# Patient Record
Sex: Male | Born: 1935
Health system: Southern US, Community
[De-identification: ages and names within clinical notes are randomized; demographics above are authoritative.]

## PROBLEM LIST (undated history)

## (undated) DIAGNOSIS — E039 Hypothyroidism, unspecified: Secondary | ICD-10-CM

## (undated) DIAGNOSIS — M1990 Unspecified osteoarthritis, unspecified site: Secondary | ICD-10-CM

## (undated) DIAGNOSIS — I447 Left bundle-branch block, unspecified: Secondary | ICD-10-CM

## (undated) DIAGNOSIS — R7302 Impaired glucose tolerance (oral): Secondary | ICD-10-CM

## (undated) DIAGNOSIS — J4489 Other specified chronic obstructive pulmonary disease: Secondary | ICD-10-CM

## (undated) DIAGNOSIS — K635 Polyp of colon: Secondary | ICD-10-CM

## (undated) DIAGNOSIS — E785 Hyperlipidemia, unspecified: Secondary | ICD-10-CM

## (undated) DIAGNOSIS — I6529 Occlusion and stenosis of unspecified carotid artery: Secondary | ICD-10-CM

## (undated) DIAGNOSIS — Z972 Presence of dental prosthetic device (complete) (partial): Secondary | ICD-10-CM

## (undated) DIAGNOSIS — T8859XA Other complications of anesthesia, initial encounter: Secondary | ICD-10-CM

## (undated) DIAGNOSIS — I209 Angina pectoris, unspecified: Secondary | ICD-10-CM

## (undated) DIAGNOSIS — L570 Actinic keratosis: Secondary | ICD-10-CM

## (undated) DIAGNOSIS — M25511 Pain in right shoulder: Secondary | ICD-10-CM

## (undated) DIAGNOSIS — R251 Tremor, unspecified: Secondary | ICD-10-CM

## (undated) DIAGNOSIS — Z87442 Personal history of urinary calculi: Secondary | ICD-10-CM

## (undated) DIAGNOSIS — I2510 Atherosclerotic heart disease of native coronary artery without angina pectoris: Secondary | ICD-10-CM

## (undated) DIAGNOSIS — I10 Essential (primary) hypertension: Secondary | ICD-10-CM

## (undated) DIAGNOSIS — E119 Type 2 diabetes mellitus without complications: Secondary | ICD-10-CM

## (undated) DIAGNOSIS — R0609 Other forms of dyspnea: Secondary | ICD-10-CM

## (undated) DIAGNOSIS — R011 Cardiac murmur, unspecified: Secondary | ICD-10-CM

## (undated) DIAGNOSIS — R072 Precordial pain: Secondary | ICD-10-CM

## (undated) DIAGNOSIS — I872 Venous insufficiency (chronic) (peripheral): Secondary | ICD-10-CM

## (undated) DIAGNOSIS — R0989 Other specified symptoms and signs involving the circulatory and respiratory systems: Secondary | ICD-10-CM

## (undated) DIAGNOSIS — G4730 Sleep apnea, unspecified: Secondary | ICD-10-CM

## (undated) DIAGNOSIS — K219 Gastro-esophageal reflux disease without esophagitis: Secondary | ICD-10-CM

## (undated) DIAGNOSIS — D509 Iron deficiency anemia, unspecified: Secondary | ICD-10-CM

## (undated) DIAGNOSIS — M25561 Pain in right knee: Secondary | ICD-10-CM

## (undated) DIAGNOSIS — J449 Chronic obstructive pulmonary disease, unspecified: Secondary | ICD-10-CM

## (undated) DIAGNOSIS — I4891 Unspecified atrial fibrillation: Secondary | ICD-10-CM

## (undated) DIAGNOSIS — I700 Atherosclerosis of aorta: Secondary | ICD-10-CM

## (undated) HISTORY — DX: Essential (primary) hypertension: I10

## (undated) HISTORY — DX: Type 2 diabetes mellitus without complications: E11.9

## (undated) HISTORY — PX: EYE SURGERY: SHX253

## (undated) HISTORY — DX: Hyperlipidemia, unspecified: E78.5

## (undated) HISTORY — DX: Venous insufficiency (chronic) (peripheral): I87.2

## (undated) HISTORY — DX: Polyp of colon: K63.5

## (undated) HISTORY — DX: Other specified chronic obstructive pulmonary disease: J44.89

## (undated) HISTORY — DX: Impaired glucose tolerance (oral): R73.02

## (undated) HISTORY — DX: Sleep apnea, unspecified: G47.30

## (undated) HISTORY — DX: Actinic keratosis: L57.0

## (undated) HISTORY — DX: Chronic obstructive pulmonary disease, unspecified: J44.9

## (undated) HISTORY — DX: Occlusion and stenosis of unspecified carotid artery: I65.29

## (undated) HISTORY — PX: CARDIAC CATHETERIZATION: SHX172

## (undated) HISTORY — DX: Other specified symptoms and signs involving the circulatory and respiratory systems: R06.09

## (undated) HISTORY — DX: Iron deficiency anemia, unspecified: D50.9

## (undated) HISTORY — DX: Atherosclerotic heart disease of native coronary artery without angina pectoris: I25.10

## (undated) HISTORY — DX: Tremor, unspecified: R25.1

## (undated) HISTORY — PX: TOTAL KNEE ARTHROPLASTY: SHX125

## (undated) HISTORY — DX: Precordial pain: R07.2

## (undated) HISTORY — DX: Other forms of dyspnea: R06.09

## (undated) HISTORY — DX: Other specified symptoms and signs involving the circulatory and respiratory systems: R09.89

---

## 1999-08-06 ENCOUNTER — Inpatient Hospital Stay (HOSPITAL_COMMUNITY): Admission: AD | Admit: 1999-08-06 | Discharge: 1999-08-07 | Payer: Self-pay | Admitting: Cardiology

## 1999-08-07 ENCOUNTER — Encounter: Payer: Self-pay | Admitting: Emergency Medicine

## 2003-11-23 ENCOUNTER — Inpatient Hospital Stay (HOSPITAL_BASED_OUTPATIENT_CLINIC_OR_DEPARTMENT_OTHER): Admission: RE | Admit: 2003-11-23 | Discharge: 2003-11-23 | Payer: Self-pay | Admitting: Cardiology

## 2004-10-12 ENCOUNTER — Emergency Department: Payer: Self-pay | Admitting: Unknown Physician Specialty

## 2005-01-15 ENCOUNTER — Emergency Department: Payer: Self-pay | Admitting: Emergency Medicine

## 2005-11-18 ENCOUNTER — Ambulatory Visit: Payer: Self-pay | Admitting: Cardiology

## 2005-12-09 ENCOUNTER — Ambulatory Visit: Payer: Self-pay

## 2006-10-28 ENCOUNTER — Ambulatory Visit: Payer: Self-pay | Admitting: Cardiology

## 2007-02-24 ENCOUNTER — Ambulatory Visit: Payer: Self-pay | Admitting: General Surgery

## 2007-09-14 ENCOUNTER — Ambulatory Visit: Payer: Self-pay | Admitting: Cardiology

## 2007-09-22 ENCOUNTER — Ambulatory Visit: Payer: Self-pay

## 2007-10-06 DIAGNOSIS — C4492 Squamous cell carcinoma of skin, unspecified: Secondary | ICD-10-CM

## 2007-10-06 HISTORY — DX: Squamous cell carcinoma of skin, unspecified: C44.92

## 2007-10-08 ENCOUNTER — Ambulatory Visit: Payer: Self-pay | Admitting: Cardiology

## 2009-05-04 ENCOUNTER — Ambulatory Visit: Payer: Self-pay | Admitting: Cardiology

## 2009-05-04 DIAGNOSIS — I779 Disorder of arteries and arterioles, unspecified: Secondary | ICD-10-CM | POA: Insufficient documentation

## 2009-05-04 DIAGNOSIS — I739 Peripheral vascular disease, unspecified: Secondary | ICD-10-CM

## 2009-05-04 DIAGNOSIS — I2510 Atherosclerotic heart disease of native coronary artery without angina pectoris: Secondary | ICD-10-CM | POA: Insufficient documentation

## 2009-05-04 DIAGNOSIS — R072 Precordial pain: Secondary | ICD-10-CM | POA: Insufficient documentation

## 2009-05-05 ENCOUNTER — Encounter: Payer: Self-pay | Admitting: Cardiology

## 2009-05-30 ENCOUNTER — Encounter (INDEPENDENT_AMBULATORY_CARE_PROVIDER_SITE_OTHER): Payer: Self-pay

## 2009-05-30 ENCOUNTER — Telehealth (INDEPENDENT_AMBULATORY_CARE_PROVIDER_SITE_OTHER): Payer: Self-pay

## 2009-06-01 ENCOUNTER — Encounter (HOSPITAL_COMMUNITY): Admission: RE | Admit: 2009-06-01 | Discharge: 2009-07-31 | Payer: Self-pay | Admitting: Cardiology

## 2009-06-01 ENCOUNTER — Encounter: Payer: Self-pay | Admitting: Cardiology

## 2009-06-01 ENCOUNTER — Ambulatory Visit: Payer: Self-pay | Admitting: Internal Medicine

## 2009-06-01 ENCOUNTER — Ambulatory Visit: Payer: Self-pay

## 2009-06-01 ENCOUNTER — Encounter: Payer: Self-pay | Admitting: Cardiovascular Disease

## 2009-08-21 ENCOUNTER — Telehealth: Payer: Self-pay | Admitting: Cardiology

## 2009-09-25 ENCOUNTER — Encounter: Payer: Self-pay | Admitting: Cardiology

## 2009-09-26 ENCOUNTER — Ambulatory Visit: Payer: Self-pay

## 2009-09-26 ENCOUNTER — Encounter: Payer: Self-pay | Admitting: Cardiology

## 2009-10-03 ENCOUNTER — Ambulatory Visit: Payer: Self-pay | Admitting: Internal Medicine

## 2009-10-03 DIAGNOSIS — I10 Essential (primary) hypertension: Secondary | ICD-10-CM | POA: Insufficient documentation

## 2009-10-03 DIAGNOSIS — R0609 Other forms of dyspnea: Secondary | ICD-10-CM

## 2009-10-03 DIAGNOSIS — R0989 Other specified symptoms and signs involving the circulatory and respiratory systems: Secondary | ICD-10-CM | POA: Insufficient documentation

## 2009-10-03 DIAGNOSIS — G4730 Sleep apnea, unspecified: Secondary | ICD-10-CM | POA: Insufficient documentation

## 2009-11-09 ENCOUNTER — Ambulatory Visit: Payer: Self-pay | Admitting: Internal Medicine

## 2009-11-09 DIAGNOSIS — J449 Chronic obstructive pulmonary disease, unspecified: Secondary | ICD-10-CM

## 2009-11-09 DIAGNOSIS — J4489 Other specified chronic obstructive pulmonary disease: Secondary | ICD-10-CM | POA: Insufficient documentation

## 2010-02-06 DIAGNOSIS — C4491 Basal cell carcinoma of skin, unspecified: Secondary | ICD-10-CM

## 2010-02-06 HISTORY — DX: Basal cell carcinoma of skin, unspecified: C44.91

## 2010-05-27 HISTORY — PX: TOTAL KNEE ARTHROPLASTY: SHX125

## 2010-06-26 NOTE — Assessment & Plan Note (Signed)
Summary: Cardiology Nuclear Study  Nuclear Med Background Indications for Stress Test: Evaluation for Ischemia, Abnormal EKG  Indications Comments: New LBBB  History: COPD, Emphysema, Heart Catheterization, Myocardial Perfusion Study  History Comments: '05 Cath: N/O CAD, EF=65%; '09 MPS:no ischemia/scar, EF=58%.  Symptoms: Chest Pain with Exertion, DOE  Symptoms Comments: Last episode of ZY:[id number], 05/31/09.   Nuclear Pre-Procedure Cardiac Risk Factors: Carotid Disease, Family History - CAD, History of Smoking, Hypertension, Lipids, Obesity Caffeine/Decaff Intake: None NPO After: 10:00 PM Lungs: Minimal expiratory wheezes.  Albuterol inhaler given, 2 puffs, prior to infusion.  Baseline O2 Sats 93-98% with DB on RA. IV 0.9% NS with Angio Cath: 22g     IV Site: (R) AC IV Started by: Irean Hong RN Chest Size (in) 50     Height (in): 75 Weight (lb): 294 BMI: 36.88  Nuclear Med Study 1 or 2 day study:  1 day     Stress Test Type:  Adenosine Reading MD:  Dietrich Pates, MD     Referring MD:  Valera Castle, MD Resting Radionuclide:  Technetium 26m Tetrofosmin     Resting Radionuclide Dose:  11.0 mCi  Stress Radionuclide:  Technetium 75m Tetrofosmin     Stress Radionuclide Dose:  33.0 mCi   Stress Protocol  Dose of Adenosine:  60.0 mg    Stress Test Technologist:  [employer] CMA-N     Nuclear Technologist:  Burna Mortimer Deal RT-N  Rest Procedure  Myocardial perfusion imaging was performed at rest 45 minutes following the intravenous administration of Myoview Technetium 64m Tetrofosmin.  Stress Procedure  The patient received IV adenosine at 140 mcg/kg/min for 4 minutes. There were episodes of 2nd degree AVB with infusion. He did c/o chest tightness with infusion.  Myoview was injected at the 2 minute mark and quantitative spect images were obtained after a 45 minute delay.  QPS Raw Data Images:  Soft tissue (diaphragm, subcutaneous fat) surround heart Stress Images:  tninning in the  inferiro wall (base) and apex.  Otherwise normal perfusion. Rest Images:  No signficant change from the stress images. Subtraction (SDS):  No evidence of ischemia. Transient Ischemic Dilatation:  1.08  (Normal <1.22)  Lung/Heart Ratio:  .33  (Normal <0.45)  Quantitative Gated Spect Images QGS EDV:  178 ml QGS ESV:  66 ml QGS EF:  63 %   Overall Impression  Exercise Capacity: Adenosine study with no exercise. BP Response: Normal blood pressure response. Clinical Symptoms: Chest tightness. ECG Impression: Nondiagnostic because of baseline changes. Overall Impression: Probable normal perfusion and very mild soft tissue attenuation.  No ischemia.  Appended Document: Cardiology Nuclear Study Low risk study. No change in treatment. Reassurance. SL NTG if he wants.  Appended Document: Cardiology Nuclear Study PT AWARE./CY

## 2010-06-26 NOTE — Miscellaneous (Signed)
Summary: Orders Update pft charges  Clinical Lists Changes  Orders: Added new Service order of Carbon Monoxide diffusing w/capacity (94720) - Signed Added new Service order of Lung Volumes (94240) - Signed Added new Service order of Spirometry (Pre & Post) (94060) - Signed 

## 2010-06-26 NOTE — Miscellaneous (Signed)
Summary: Orders Update  Clinical Lists Changes  Orders: Added new Test order of Carotid Duplex (Carotid Duplex) - Signed 

## 2010-06-26 NOTE — Progress Notes (Signed)
Summary: PROBLEMS  Phone Note Call from Patient Call back at Home Phone [phone]   Caller: SELF Call For: Jared Rice Summary of Call: PT WOULD LIKE TO KNOW WHAT STEPS HE NEEDS TO TAKE NOW-STILL HAVING BURNING IN THE CHEST WITH LESS EXERTION-HAPPENING MORE FREQUENTLY Initial call taken by: [name] Flor,  August 21, 2009 11:32 AM  Follow-up for Phone Call        SPOKE WITH PT CONT. TO HAVE BURNING TO CHEST IS BROUGHT ON  MORE FREQUENTLY CONT TO DISIPATE WITH REST AFTER 1-2 MIN NO NTG TAKEN HAS SEVERAL QUESTIONS DOES HE NEED PULMONARY  TO EVALUATE  IS C/O OF SOB ? ALSO WAS INFORMED BY PMD HAS COPD  OR SHOULD HE F/U WITH PMD FOR ABOVE PLEASE ADVISE. CELL PHONE NUMBER [phone] Follow-up by: Scherrie Bateman, LPN,  August 21, 2009 11:59 AM  Additional Follow-up for Phone Call Additional follow up Details #1::        Do not feel cardiac. Check with Primary Care to see if Pulmonary Consult indicated. Additional Follow-up by: Gaylord Shih, MD, [facility],  August 21, 2009 2:45 PM     Appended Document: PROBLEMS PT AWARE TO F/U WITH PMD.  ALSO AWARE IF CONT ONLY OTHER TEST WOULD BE CATH TO FURTHER EVALUATE CHEST PAIN.CY

## 2010-06-26 NOTE — Assessment & Plan Note (Signed)
Summary: Pulmonary/ new pt eval for atypical sob and cp   Visit Type:  Initial Consult Copy to:  Dr. Valera Castle Primary Provider/Referring Provider:  Franne Forts  CC:  Chest Pain.  History of Present Illness: 77 yowm Former smoker. Quit in 1982 with no  resp symptoms then.  Oct 03, 2009 cc 8 years doe and  ex cp worse x 1 year with 2 neg LHC and neg cardiolyte does not ever remember reprocuing  this during any treadmills.  minimal assoc throat clearing  and HB but no real cough. Pt denies any significant sore throat, dysphagia, itching, sneezing,  nasal congestion or excess secretions,  fever, chills, sweats, unintended wt loss, pleuritic cp, hempoptysis, change in activity tolerance  orthopnea pnd or leg swelling.  Pt also denies any obvious fluctuation in symptoms with weather or environmental change or other alleviating or aggravating factors.       Current Medications (verified): 1)  Doxazosin Mesylate 8 Mg Tabs (Doxazosin Mesylate) .[name].. 1 By Mouth Daily 2)  Levothyroxine Sodium 75 Mcg Tabs (Levothyroxine Sodium) .[name].. 1 By Mouth Daily 3)  Crestor 20 Mg Tabs (Rosuvastatin Calcium) .... 1/2 By Mouth At Bedtime 4)  Furosemide 20 Mg Tabs (Furosemide) .[name].. 1 By Mouth Every Morning 5)  Quinapril Hcl 40 Mg Tabs (Quinapril Hcl) .[name].. 1 By Mouth Daily 6)  Aspirin 325 Mg Tabs (Aspirin) .[name].. 1 By Mouth At Bedtime 7)  Multivitamins  Tabs (Multiple Vitamin) .[name].. 1 By Mouth Every Morning 8)  Aleve 220 Mg Tabs (Naproxen Sodium) .... 2 By Mouth Every Morning 9)  Nitrolingual 0.4 Mg/spray Soln (Nitroglycerin) .... One Spray Under Tongue Every 5 Minutes As Needed For Chest Pain---May Repeat Times Three 10)  Vitamin D 2000 Unit Tabs (Cholecalciferol) .[name].. 1 Once Daily  Allergies (verified): No Known Drug Allergies  Past History:  Past Medical History: CAROTID ARTERY STENOSIS, WITHOUT INFARCTION (ICD-433.10) CHEST PAIN-PRECORDIAL (ICD-786.51) CAD, NATIVE VESSEL (ICD-414.01) Sleep  Apnea Atypical Dyspnea with pseudowheeze resolves with purse lip maneuver       - Try off ACE Oct 03, 2009   Social History: Married Retired from [employer] 2 twin daughters (lives in [place]) 2 step-daughters Former smoker. Quit in 1982.  Smoked for 30 yrs up to 3 ppd. Alcohol-occasionally when out with friends Illicit drugs- no  Review of Systems       The patient complains of shortness of breath with activity, chest pain, acid heartburn, and hand/feet swelling.  The patient denies shortness of breath at rest, productive cough, non-productive cough, coughing up blood, irregular heartbeats, indigestion, loss of appetite, weight change, abdominal pain, difficulty swallowing, sore throat, tooth/dental problems, headaches, nasal congestion/difficulty breathing through nose, sneezing, itching, ear ache, anxiety, depression, joint stiffness or pain, rash, change in color of mucus, and fever.    Vital Signs:  Patient profile:   77 year old male Weight:      301 pounds O2 Sat:      95 % on Room air Temp:     97.4 degrees F oral Pulse rate:   64 / minute BP sitting:   140 / 82  (left arm)  Vitals Entered By: Vernie Murders (Oct 03, 2009 2:56 PM)  O2 Flow:  Room air  Physical Exam  Additional Exam:  pleasant amb wm mild hoarseness and pseudowheeze resolves with purse lip maneuver  HEENT: nl dentition, turbinates, and orophanx. Nl external ear canals without cough reflex NECK :  without JVD/Nodes/TM/ nl carotid upstrokes bilaterally LUNGS: no  acc muscle use, clear to A and P bilaterally without cough on insp or exp maneuvers CV:  RRR  no s3 or murmur or increase in P2, no edema  ABD:  soft and nontender with nl excursion in the supine position. No bruits or organomegaly, bowel sounds nl MS:  warm without deformities, calf tenderness, cyanosis or clubbing SKIN: warm and dry without lesions   NEURO:  alert, approp, no deficits     Impression &  Recommendations:  Problem # 1:  DYSPNEA (ICD-786.09)   DDX of  difficult airways managment all start with A and  include Adherence, Ace Inhibitors, Acid Reflux, Active Sinus Disease, Alpha 1 Antitripsin deficiency, Anxiety masquerading as Airways dz,  ABPA,  allergy(esp in young), Aspiration (esp in elderly), Adverse effects of DPI,  Active smokers, plus one B  = Beta blocker use..   This is either ace or Acid Reflux (occult) or combination of the two.  try off ace and return for pft's  Problem # 2:  HYPERTENSION, BENIGN (ICD-401.1)  The following medications were removed from the medication list:    Quinapril Hcl 40 Mg Tabs (Quinapril hcl) .[name]... 1 by mouth daily His updated medication list for this problem includes:    Doxazosin Mesylate 8 Mg Tabs (Doxazosin mesylate) .[name]... 1 by mouth daily    Furosemide 20 Mg Tabs (Furosemide) .[name]... 1 by mouth every morning    Diovan 320 Mg Tabs (Valsartan) ..... One by mouth daily    ACE inhibitors are problematic in  pts with airway complaints because  even experienced pulmonologists can't always distinguish ace effects from copd/asthma.  By themselves they don't actually cause a problem, much like oxygen can't by itself start a fire, but they certainly serve as a powerful catalyst or enhancer for any "fire"  or inflammatory process in the upper airway, be it caused by an ET  tube or more commonly reflux (especially in the obese or pts with known GERD or who are on biphoshonates).  In the era of ARB near equivalency until we have a better handle on the reversibility of the airway problem, it just makes sense to avoid ace entirely in the short run and then decide later, having established a level of airway control using a reasonable limited regimen, whether to add back ace but even then being very careful to observe the pt for worsening airway control and number of meds used/ needed to control symptoms.    Medications Added to Medication List This Visit: 1)   Diovan 320 Mg Tabs (Valsartan) .... One by mouth daily 2)  Vitamin D 2000 Unit Tabs (Cholecalciferol) .[name].. 1 once daily  Other Orders: T-2 View CXR ([id number])  Patient Instructions: 1)  Please schedule a follow-up appointment in 6 weeks, sooner if needed with PFT's on return 2)  Stop quinapril and start diovan 320  3)  Stop Vit D and all oil baseds vitamins for now 4)  GERD (REFLUX)  is a common cause of respiratory symptoms. It commonly presents without heartburn and can be treated with medication, but also with lifestyle changes including avoidance of late meals, excessive alcohol, smoking cessation, and avoid fatty foods, chocolate, peppermint, colas, red wine, and acidic juices such as orange juice. NO MINT OR MENTHOL PRODUCTS SO NO COUGH DROPS  5)  USE SUGARLESS CANDY INSTEAD (jolley ranchers)  6)  NO OIL BASED VITAMINS   Appended Document: Pulmonary/ new pt eval for atypical sob and cp cxr :  Chronic obstructive pulmonary disease  with mild cardiac enlargement.  No acute cardiopulmonary process demonstrated.    Appended Document: Orders Update    Clinical Lists Changes  Orders: Added new Service order of New Patient Level V [phone]) - Signed

## 2010-06-26 NOTE — Miscellaneous (Signed)
Summary: Outpatient Coinsurance Notice  Outpatient Coinsurance Notice   Imported By: Marylou Mccoy 06/06/2009 15:48:37  _____________________________________________________________________  External Attachment:    Type:   Image     Comment:   External Document

## 2010-06-26 NOTE — Miscellaneous (Signed)
Summary: Change to adenosine myoview per Dr. Algis Greenhouse  Clinical Lists Changes     Spoke with Dr. Daleen Squibb about Dobutamine Echo for 06/01/09 in regards to new LBBB that can affect the  wall motion on echo, change to adenosine myoview per Dr. Karie Schwalbe. Wall/ P. Marites Nath,RN.

## 2010-06-26 NOTE — Progress Notes (Signed)
Summary: Nuc. Pre-Procedure  Phone Note Outgoing Call Call back at [facility] Phone [phone]   Call placed by: Irean Hong, RN,  May 30, 2009 3:06 PM Summary of Call: Reviewed information on Myoview Information Sheet (see scanned document for further details).  Spoke with Jared Rice's wife, and Jared Rice.     Nuclear Med Background Indications for Stress Test: Evaluation for Ischemia, Abnormal EKG  Indications Comments: New LBBB  History: COPD, Heart Catheterization, Myocardial Perfusion Study  History Comments: '05 Cath: N/O CAD,EF=65%. 4/09 MPS:(-) ischemia,(-) scar,EF=58%.  Symptoms: Chest Pain with Exertion, DOE    Nuclear Pre-Procedure Cardiac Risk Factors: Carotid Disease, Family History - CAD, History of Smoking, Hypertension, Lipids Height (in): 75

## 2010-06-26 NOTE — Assessment & Plan Note (Signed)
Summary: Pulmonary f/u summary ov - rx empiric ppi   Copy to:  Dr. Valera Castle Primary Provider/Referring Provider:  Franne Forts  CC:  6 wk followup.  Pt states that he is still having the same CP "maybe some improvement".  He states that his breathing is the same- no better or worse.  He c/o swelling in feet getting worse.  [name]  History of Present Illness: 72 yowm Former smoker. Quit in 1982 with no  resp symptoms then.  Oct 03, 2009 initial pulm eval  cc 8 years doe and  ex cp worse x 1 year with 2 neg LHC and neg cardiolyte does not ever remember reproducing  this during any treadmills.  minimal assoc throat clearing  and HB but no real cough.  imp was atypical cp ? gerd ? ace plus gerd effects Stop quinapril and start diovan 320  Stop Vit D and all oil baseds vitamins for now/ gerd diet  November 09, 2009 6 wk followup.  Pt states that he is still having the same CP "maybe some improvement".  He states that his breathing is the same- no better or worse.  He c/o swelling in feet getting worse.  Pt denies any significant sore throat, dysphagia, itching, sneezing,  nasal congestion or excess secretions,  fever, chills, sweats, unintended wt loss, classic or reproducible  pleuritic , hempoptysis, change in activity tolerance  orthopnea pnd     Pt also denies any obvious fluctuation in symptoms with weather or environmental change or other alleviating or aggravating factors.          Current Medications (verified): 1)  Doxazosin Mesylate 8 Mg Tabs (Doxazosin Mesylate) .[name].. 1 By Mouth Daily 2)  Levothyroxine Sodium 75 Mcg Tabs (Levothyroxine Sodium) .[name].. 1 By Mouth Daily 3)  Crestor 20 Mg Tabs (Rosuvastatin Calcium) .... 1/2 By Mouth At Bedtime 4)  Furosemide 20 Mg Tabs (Furosemide) .[name].. 1 By Mouth Every Morning 5)  Aspirin 325 Mg Tabs (Aspirin) .[name].. 1 By Mouth At Bedtime 6)  Multivitamins  Tabs (Multiple Vitamin) .[name].. 1 By Mouth Every Morning 7)  Diovan 320 Mg  Tabs (Valsartan) .... One By  Mouth Daily 8)  Aleve 220 Mg Tabs (Naproxen Sodium) .... 2 By Mouth Every Morning 9)  Nitrolingual 0.4 Mg/spray Soln (Nitroglycerin) .... One Spray Under Tongue Every 5 Minutes As Needed For Chest Pain---May Repeat Times Three  Allergies (verified): No Known Drug Allergies  Past History:  Past Medical History: CAROTID ARTERY STENOSIS, WITHOUT INFARCTION (ICD-433.10) CHEST PAIN-PRECORDIAL (ICD-786.51) CAD, NATIVE VESSEL (ICD-414.01) Sleep Apnea Atypical Dyspnea with pseudowheeze resolves with purse lip maneuver       - Try off ACE Oct 03, 2009 >  November 09, 2009 some better COPD      - PFTs November 09, 2009 FEV1 2.47 (76%) ratio 63 with no better after B2 DLC0 84  Vital Signs:  Patient profile:   77 year old male Weight:      300 pounds O2 Sat:      94 % on Room air Temp:     97.7 degrees F oral Pulse rate:   56 / minute BP sitting:   148 / 74  (right arm)  Vitals Entered By: Vernie Murders (November 09, 2009 12:14 PM)  O2 Flow:  Room air  Physical Exam  Additional Exam:  pleasant amb wm no longer  hoarse and no pseudowheeze  HEENT: nl dentition, turbinates, and orophanx. Nl external ear canals without cough reflex NECK :  without JVD/Nodes/TM/ nl carotid upstrokes bilaterally LUNGS: no acc muscle use, clear to A and P bilaterally without cough on insp or exp maneuvers CV:  RRR  no s3 or murmur or increase in P2, no edema  ABD:  soft and nontender with nl excursion in the supine position. No bruits or organomegaly, bowel sounds nl MS:  warm without deformities, calf tenderness, cyanosis or clubbing     CXR  Procedure date:  10/03/2009  Findings:        IMPRESSION: Chronic obstructive pulmonary disease with mild cardiac enlargement.  No acute cardiopulmonary process demonstrated.  Impression & Recommendations:  Problem # 1:  DYSPNEA (ICD-786.09)  DDX of  difficult airways managment all start with A and  include Adherence, Ace Inhibitors, Acid Reflux, Active Sinus  Disease, Alpha 1 Antitripsin deficiency, Anxiety masquerading as Airways dz,  ABPA,  allergy(esp in young), Aspiration (esp in elderly), Adverse effects of DPI,  Active smokers, plus one B  = Beta blocker use..   This is either ace or Acid Reflux (occult) or combination of the two. off ace improving but needs cpst if not showing training benefit on empiric prilosec 20 mg daily trial x minimum of 2 weeks  Orders: Est. Patient Level IV (16109)  Problem # 2:  COPD UNSPECIFIED (ICD-496) Minimal sp remote smoking cessation with residual fev1 > 2 liters so should not limit exertion.  Problem # 3:  HYPERTENSION, BENIGN (ICD-401.1)  His updated medication list for this problem includes:    Doxazosin Mesylate 8 Mg Tabs (Doxazosin mesylate) .[name]... 1 by mouth daily    Furosemide 20 Mg Tabs (Furosemide) .[name]... 1 by mouth every morning    Diovan 320 Mg Tabs (Valsartan) ..... One by mouth daily  Would prefer to avoid ace until sort out his atypical symptoms bute defer longterm rx to Dr Sullivan Lone    Patient Instructions: 1)  You have minimal residual smoking damage and should not be limited by your breathing. 2)  Weight control is simply a matter of calorie balance which needs to be tilted in your favor by eating less and exercising more.  To get the most out of exercise, you need to be continuously aware that you are short of breath, but never out of breath, for 30 minutes daily. As you improve, it will actually be easier for you to do the same amount in  30 minutes so always push to the level where you are short of breath.  If this does not result in gradual weight reduction,  I recommend  a nutritionist for a food diary  3)  If ex tolerance improving over several weeks, follow up Sullivan Lone 4)  If not improving call me to schedule a cpst 5)  Late ADD:  start prilosec 20mg  otc Take  one 30-60 min before first meal of the day x 2 weeks trial minimum before consider cpst  Appended Document: Pulmonary f/u  summary ov - rx empiric ppi see late addendum on instructions and call to pt  Appended Document: Pulmonary f/u summary ov - rx empiric ppi Spoke with pt and advised to get prilosec OTC 20 mg and take 1 30-60 min before 1st meal x 2 wks as a trial.  Pt verbalzied understanding.

## 2010-08-20 ENCOUNTER — Ambulatory Visit: Payer: Self-pay | Admitting: General Practice

## 2010-09-03 ENCOUNTER — Inpatient Hospital Stay: Payer: Self-pay | Admitting: General Practice

## 2010-09-14 ENCOUNTER — Ambulatory Visit: Payer: Self-pay

## 2010-09-16 ENCOUNTER — Inpatient Hospital Stay: Payer: Self-pay | Admitting: Internal Medicine

## 2010-09-16 DIAGNOSIS — R079 Chest pain, unspecified: Secondary | ICD-10-CM

## 2010-09-17 DIAGNOSIS — R0602 Shortness of breath: Secondary | ICD-10-CM

## 2010-10-09 NOTE — Assessment & Plan Note (Signed)
[facility] OFFICE NOTE   NAME:Bedgood, LEIBISH MCGREGOR                      MRN:          [id number]  DATE:10/28/2006                            DOB:          April 29, 1933    Mr. Niazi returns today for further management of the following  issues:  1. Nonobstructive coronary artery disease.  2. Normal left ventricular systolic function.  3. History of exertional dyspnea and chest discomfort.  Stress Myoview      December 09, 2005:  EF 58%, no sign of scar or ischemia.  Exercise      tolerance reduced.  4. Mixed hyperlipidemia.  5. Obesity.  6. Nonobstructive carotid disease.  Carotid Dopplers December 09, 2005.   He enjoys playing golf 3 days a week with his friends.  He is having a  hard time keeping his weight down.  His blood sugar was slightly  elevated at 103 on his last blood work with Dr. Sullivan Lone.   MEDICATIONS:  1. Doxazosin 8 mg a day.  2. Levothyroxine 25 mcg a day.  3. Quinapril 40 mg a day.  4. Crestor 10 mg a day.  5. Furosemide 20 mg a day.  6. Enteric-coated aspirin 325 mg a day.   EXAMINATION:  GENERAL:  He is very jovial.  No acute distress.  Alert  and oriented x3.  NEUROLOGIC:  Exam is intact.  VITAL SIGNS:  Blood pressure is 118/70, his pulse 53.  He is in sinus  bradycardia with a first-degree AV block, unchanged from before.  His  weight is 286, which is up.  HEENT:  Normocephalic, atraumatic.  PERRLA.  Extraocular movements  intact.  Sclerae clear.  Facial symmetry is normal.  NECK:  Carotids are full without bruits.  There is no JVD.  Thyroid is  not enlarged.  Trachea is midline.  LUNGS:  Clear.  HEART:  Reveals a soft S1, S2 without murmur or gallop.  PMI could not  be appreciated.  ABDOMEN:  Obese with good bowel sounds.  Organomegaly cannot be  appreciated.  EXTREMITIES:  Reveal no cyanosis, clubbing or edema.  Pulses are  present.   ASSESSMENT AND PLAN:  I have had a nice chat with  Mr. Rufener today.  Though he is clinically stable at present with his cardiac and vascular  disease, I am very concerned about his risk of developing type 2  diabetes and all its negative effects.  We have had a long talk about  this today.  I have strongly encouraged him to try to drop 25-30 pounds  and keep it off.   I have made no changes in his program.  I will plan on seeing him back  in a year.     Thomas C. Daleen Squibb, MD, [facility]  Electronically Signed    TCW/MedQ  DD: 10/28/2006  DT: 10/28/2006  Job #: [id number]   cc:   Julieanne Manson

## 2010-10-09 NOTE — Assessment & Plan Note (Signed)
[facility] OFFICE NOTE   NAME:Brocious, DARIUS LUNDBERG                      MRN:          [id number]  DATE:09/14/2007                            DOB:          Oct 06, 1932    HISTORY OF PRESENT ILLNESS:  Mr. Jared Rice comes in today for recurrent  chest discomfort.   Over the last several months, but really off and on chronically, he has  had some aching left the sided chest discomfort that does not radiate.  It seems to be associated with exertion, though his history is very  vague.  He is under a lot of stress with a couple of family problems.   He does have some exertional dyspnea and has had this in the past.  His  last stress Myoview was December 09, 2005, EF 58%.  No scar ischemia.  His  exercise tolerance was reduced.  He does have a history of  nonobstructive coronary disease and his last cath was November 23, 2003.  His most significant lesion was only about 20%.  Normal left ventricular  function.   He has hypertension as well as mixed hyperlipidemia and his weight  unfortunately continues to go up.  He also has a history of  nonobstructive carotid disease which is asymptomatic.   Denies orthopnea, PND peripheral edema, presyncope or syncope.  He does  have some neck pain on occasion and was worried this was his carotids.   MEDICATIONS:  1. Doxazosin 8 mg a day.  2. Levothyroxine 25 mcg a day.  3. Quinapril 40 mg a day.  4. Crestor 10 mg a day.  5. Furosemide 20 mg day.  6. Enteric-coated aspirin 325 mg a day.   PHYSICAL EXAMINATION:  VITAL SIGNS:  Blood pressure today is 136/80,  pulse 75 and regular.  Weight has gone up to 298 from 286.  HEENT:  Unchanged.  NECK:  Carotids are full.  He has a soft right carotid bruit.  Thyroid  is not enlarged.  Trachea is midline.  LUNGS:  Clear to auscultation.  HEART:  Reveals a poorly appreciated PMI.  He has normal S1-S2 without  gallop.  ABDOMEN:  Good bowel sounds.   Organomegaly was difficult to assess.  EXTREMITIES:  Reveal 1+ pretibial edema.  Pulses are intact.  NEUROLOGICAL:  Exam is intact.  SKIN:  Unremarkable.   ASSESSMENT:  I had a long talk with Mr. Nicolosi today.  We reviewed an  EKG that was done, obtained in Dr. Elisabeth Cara office on April 9.  This  was essentially normal and there has been no change.   I would be surprised if Mr. Miyasato has developed obstructive coronary  disease.  However, he is having exertional chest discomfort and  shortness of breath.  Before we proceed with cath, will perform an  adenosine rest/stress Myoview.  This is low risk or shows no ischemia.  Will continue with secondary prevention.  In addition, we will obtain  carotid Dopplers to rule out any progressive disease in his carotids.   Will schedule him to come back just to sit down  and talk for reassurance  and answer any questions you might have in a couple of weeks.     Thomas C. Daleen Squibb, MD, [facility]  Electronically Signed    TCW/MedQ  DD: 09/14/2007  DT: 09/14/2007  Job #: [id number]   cc:   Julieanne Manson

## 2010-10-09 NOTE — Assessment & Plan Note (Signed)
[facility] OFFICE NOTE   NAME:Jared Rice, Jared Rice                      MRN:          [id number]  DATE:10/08/2007                            DOB:          18-Dec-1932    Mr. Gola returns today for further management of his chest  discomfort.   His stress Myoview shows an EF of 63% with normal contractility and  thickening of all areas of the myocardium.  There was no sign of scar or  ischemia.   His carotid Doppler showed nonobstructive plaque in the carotid arteries  and antegrade flow in both vertebrals   I discussed this at length with him today.  I have told him to stay on  quinapril, Crestor, and aspirin.  He will try to get his weight down.  I  will plan on seeing him back in a year.     Thomas C. Daleen Squibb, MD, [facility]  Electronically Signed    TCW/MedQ  DD: 10/08/2007  DT: 10/08/2007  Job #: [id number]   cc:   Julieanne Manson

## 2010-10-12 NOTE — Discharge Summary (Signed)
[place]  Patient:    ZAELYN, BARBARY                        MRN: [id number] Adm. Date:  [id number] Disc. Date: [id number] Attending:  Rollene Rotunda Dictator:   Tereso Newcomer, P.A.-C.                           Discharge Summary  DATE OF BIRTH:  1933/01/12  DISCHARGE DIAGNOSES: 1. Chest pain, ruled out for a myocardial infarction by enzymes    and electrocardiogram.  Normal stress Cardiolite August 07, 1999. 2. Hypertension. 3. Possible knee osteoarthritis. 4. Cardiac catheterization five years prior reported as clean, per the    patient. 5. A 2-D echocardiogram and Cardiolite negative eight to nine years prior.  HISTORY OF PRESENT ILLNESS:  This 77 year old white male with hypertension began to have strange symptoms three to four months prior to admission.  He noted that he almost passed out and felt nauseated.  It happened again a few days prior to admission x 2.  This prompted him to go to the emergency room at [facility]. He had a negative workup there, including a head CT, D-dimer, and electrocardiogram per his report.  On the morning of admission he had the same symptoms with a near syncopal episode.  He had to sit down.  He noticed tingling in his hands and legs. He also noted some chest pain mowing the lawn.  These were the same symptoms he had had five years prior that prompted a cardiac catheterization.  The chest pain usually goes away 10 minutes after starting the lawnmower.  He has had some increased dyspnea on exertion recently with going up steps.  PHYSICAL EXAMINATION:  GENERAL:  A well-developed, well-nourished male, in no apparent distress.  VITAL SIGNS:  Blood pressure 155/85, pulse 48.  HEART:  S1, S2, brady, no murmurs.  LUNGS:  Clear to auscultation.  EXTREMITIES:  Without edema.  HOSPITAL COURSE:  The patient was admitted and observed on telemetry.  He had no significant arrhythmias noted.  He had  cardiac enzymes drawn and these were negative.  A total CPK #1 of 60, #2 of 51, #3 of 49.  CPK-MB #1 of 1.1, #2 of 0.7, #3 of 2.0.  Troponin I less than 0.03 x 2.  The patient had a stress Cardiolite  performed on August 07, 1999.  This test was symptom-limited and less than 85% of predicted maximal heart rate was achieved.  The patient experienced some shortness of breath and slight chest tightness.  Cardiolite images showed no evidence of stress-induced ischemia.  The ejection fraction of 61%.  DISPOSITION:  The patient was felt stable enough for discharge to home, with followup in the office with Dr. Noralyn Pick. Nishan.  DISCHARGE MEDICATIONS: 1. Toprol XL 50 mg q.d. 2. Accupril 40 mg q.d. 3. Cardura 8 mg q.d. 4. Enteric-coated aspirin 325 mg q.d. 5. Multivitamin and vitamin C. 6. Glucosamine. 7. Aleve p.r.n. 8. Prevacid 30 mg q.d.  DIET:  The patient is to maintain a low-fat, low-sodium diet.  INSTRUCTIONS:  He is to call the office in the morning for a follow-up appointment with Dr. Eden Emms.  DISCHARGE LABORATORY DATA:  WBC 7.9, hemoglobin 12.1, hematocrit 36.4, RDW 14.7, platelet count 178,000.  Sodium 137, potassium 3.7, chloride 104, CO2 of 29, glucose 117, BUN 13, creatinine 0.8.  Calcium 8.8, total protein  6.7, albumin 3.5, AST 19, ALT 15, alkaline phosphatase 74, total bilirubin 0.7. DD:  10/03/99 TD:  10/03/99 Job: [id number]

## 2010-10-12 NOTE — Cardiovascular Report (Signed)
NAMEBAYLOR, CORTEZ NO.:  [id number]   MEDICAL RECORD NO.:  [id number]                   PATIENT TYPE:  OIB   LOCATION:  6501                                 FACILITY:  [facility]   PHYSICIAN:  Jonelle Sidle, M.D. [facility]        DATE OF BIRTH:  May 01, 1933   DATE OF PROCEDURE:  11/23/2003  DATE OF DISCHARGE:  11/23/2003                              CARDIAC CATHETERIZATION   CARDIOLOGIST:  Maisie Fus C. Wall, M.D.   CARDIOLOGIST PERFORMING CATHETERIZATION:  Jonelle Sidle, M.D.   PRIMARY CARE PHYSICIAN:  Julieanne Manson, M.D., [facility]   INDICATIONS:  Mr. Mackins is a 77 year old male with a history of  hypertension, obesity, dyslipidemia, and nonobstructive coronary  atherosclerosis by catheterization in 1998.  He has had some recent  exertional chest pain and is referred for redefinition of his coronary  anatomy to assess the potential for progression.   PROCEDURES PERFORMED:  1. Left heart catheterization.  2. Selective coronary angiography.  3. Left ventriculography.   ACCESS AND EQUIPMENT:  The area about the right femoral artery was  anesthetized with 1% lidocaine and a 5 French sheath was placed in the right  femoral artery via the modified Seldinger technique. Standard preformed 6  [place] and JR4 catheters were used for selective coronary angiography,  and an angled pigtail catheter was used for left heart catheterization and  left ventriculography.  All exchanges were made over a wire, and the patient  tolerated the procedure without immediate complications.   FINDINGS:  HEMODYNAMICS:  1. The LV is 185/20 mmHg.  2. Aorta 189/92 mmHg.   ANGIOGRAPHIC FINDINGS:  1. Left main coronary artery is free of significant flow-limiting coronary     sclerosis.  There are minor luminal irregularities noted.  2. The left anterior descending is a medium caliber vessel with two diagonal     branches proximally and at the mid  vessel level.  There is a 30% stenosis     involving the first diagonal branch. The left anterior descending has     mild diffuse atherosclerosis with 20% proximal stenosis and 20-30% mid     vessel stenosis.  3. The circumflex coronary artery is a large caliber vessel with a large     bifurcating first obtuse marginal branch followed by a smaller second     obtuse marginal branch and a larger third obtuse marginal branch.  There     are minor luminal irregularities with no flow-limiting coronary     atherosclerosis.  4. The right coronary artery is a large dominant vessel.  There is a 20%     stenosis in the posterior descending branch and other mild luminal     irregularities.   LEFT VENTRICULOGRAPHY:  Left ventriculography was performed in RAO  projection and revealed an ejection fraction of approximately 65% mitral  regurgitation.   DIAGNOSES:  1. Mild nonobstructive coronary atherosclerosis as described.  2.  Left ventricular ejection fracture approximately 65% without significant     mitral regurgitation.   PLAN:  I discussed the results with Dr. Daleen Squibb.  At this point, we will  continue aggressive risk factor modification.  No indications for  revascularization at this point.                                               Jonelle Sidle, M.D. [facility]    SGM/MEDQ  D:  11/23/2003  T:  11/23/2003  Job:  [id number]

## 2010-10-15 ENCOUNTER — Encounter: Payer: Self-pay | Admitting: Cardiology

## 2010-10-19 ENCOUNTER — Encounter: Payer: Medicare Other | Admitting: Cardiology

## 2011-10-30 LAB — CBC AND DIFFERENTIAL
HCT: 33 % — AB (ref 41–53)
Hemoglobin: 10.6 g/dL — AB (ref 13.5–17.5)
NEUTROS ABS: 6 /uL
PLATELETS: 141 10*3/uL — AB (ref 150–399)
WBC: 8.4 10^3/mL

## 2011-11-19 ENCOUNTER — Encounter (INDEPENDENT_AMBULATORY_CARE_PROVIDER_SITE_OTHER): Payer: Medicare Other

## 2011-11-19 ENCOUNTER — Other Ambulatory Visit: Payer: Self-pay | Admitting: *Deleted

## 2011-11-19 DIAGNOSIS — I6529 Occlusion and stenosis of unspecified carotid artery: Secondary | ICD-10-CM

## 2011-12-10 ENCOUNTER — Encounter: Payer: Medicare Other | Admitting: Cardiology

## 2011-12-12 ENCOUNTER — Encounter: Payer: Self-pay | Admitting: *Deleted

## 2011-12-24 ENCOUNTER — Encounter: Payer: Self-pay | Admitting: *Deleted

## 2011-12-31 ENCOUNTER — Encounter: Payer: Self-pay | Admitting: Cardiology

## 2011-12-31 ENCOUNTER — Ambulatory Visit (INDEPENDENT_AMBULATORY_CARE_PROVIDER_SITE_OTHER): Payer: Medicare Other | Admitting: Cardiology

## 2011-12-31 ENCOUNTER — Ambulatory Visit: Payer: Medicare Other | Admitting: Cardiology

## 2011-12-31 VITALS — BP 126/78 | HR 59 | Ht 75.0 in | Wt 286.0 lb

## 2011-12-31 DIAGNOSIS — R001 Bradycardia, unspecified: Secondary | ICD-10-CM | POA: Insufficient documentation

## 2011-12-31 DIAGNOSIS — I447 Left bundle-branch block, unspecified: Secondary | ICD-10-CM | POA: Insufficient documentation

## 2011-12-31 DIAGNOSIS — I2510 Atherosclerotic heart disease of native coronary artery without angina pectoris: Secondary | ICD-10-CM

## 2011-12-31 DIAGNOSIS — I6529 Occlusion and stenosis of unspecified carotid artery: Secondary | ICD-10-CM

## 2011-12-31 DIAGNOSIS — I498 Other specified cardiac arrhythmias: Secondary | ICD-10-CM

## 2011-12-31 DIAGNOSIS — R609 Edema, unspecified: Secondary | ICD-10-CM

## 2011-12-31 DIAGNOSIS — R072 Precordial pain: Secondary | ICD-10-CM

## 2011-12-31 DIAGNOSIS — R600 Localized edema: Secondary | ICD-10-CM

## 2011-12-31 DIAGNOSIS — I440 Atrioventricular block, first degree: Secondary | ICD-10-CM

## 2011-12-31 NOTE — Assessment & Plan Note (Signed)
Stable. With first-degree AV block and a history of sinus bradycardia on low-dose propranolol we need to monitor him for worsening bradycardia or complete heart block in the future. Warning symptoms reviewed. No changes made in medicines today.

## 2011-12-31 NOTE — Patient Instructions (Addendum)
Your physician has requested that you have an echocardiogram. Echocardiography is a painless test that uses sound waves to create images of your heart. It provides your doctor with information about the size and shape of your heart and how well your heart's chambers and valves are working. This procedure takes approximately one hour. There are no restrictions for this procedure.   Your physician recommends that you continue on your current medications as directed. Please refer to the Current Medication list given to you today.  Your physician recommends that you schedule a follow-up appointment in: 1 year with Dr. Daleen Squibb

## 2011-12-31 NOTE — Assessment & Plan Note (Signed)
Nonobstructive by recent Dopplers. Continue secondary preventative therapy.

## 2011-12-31 NOTE — Progress Notes (Signed)
HPI Jared Rice returns today for evaluation and management of his history of coronary artery disease, history of chronic chest pain, history of nonobstructive carotid disease, hypertension, history of bradycardia left bundle branch block.  At the present time he offers no complaints of chest pain. He has had problems with lower extremity edema from venous insufficiency and is wearing support hose is on low-dose Lasix which has helped. He denies orthopnea, PND.  He had a negative stress Myoview in 2011 for chest pain. Carotid Dopplers in June of this year showed nonobstructive disease. He is on secondary preventative therapy.  During recent knee replacement, he was told his heart rate was in the 40s. His propranolol was cut back to 20 mg a morning and 10 mg the evening. He denies any presyncope or syncope. He has chronic first-degree block and left bundle-branch block. His heart rate today is 59.  His laboratory data is followed Dr. Sullivan Lone.  Past Medical History  Diagnosis Date  . Occlusion and stenosis of carotid artery without mention of cerebral infarction   . Precordial pain   . Coronary atherosclerosis of native coronary artery   . Unspecified sleep apnea   . Other dyspnea and respiratory abnormality     w/ pseuodowheeze resolves with purse lip manuever  . Chronic airway obstruction, not elsewhere classified   . Essential hypertension, benign     Current Outpatient Prescriptions  Medication Sig Dispense Refill  . aspirin 325 MG EC tablet Take 325 mg by mouth daily.        [name] doxazosin (CARDURA) 8 MG tablet Take 8 mg by mouth at bedtime.        . furosemide (LASIX) 20 MG tablet Take 20 mg by mouth daily.        [name] levothyroxine (SYNTHROID, LEVOTHROID) 75 MCG tablet Take 75 mcg by mouth daily.        . naproxen sodium (ANAPROX) 220 MG tablet Take 440 mg by mouth daily at 6 (six) AM.        . nitroGLYCERIN (NITROSTAT) 0.4 MG SL tablet Place 0.4 mg under the tongue every 5 (five) minutes  as needed.        . propranolol (INDERAL) 20 MG tablet Take by mouth. Take 20mg  in the am  and 10mg  in the pm by mouth daily      . quinapril (ACCUPRIL) 40 MG tablet Take 40 mg by mouth at bedtime.      . rosuvastatin (CRESTOR) 20 MG tablet Take 10 mg by mouth daily.          No Known Allergies  Family History  Problem Relation Age of Onset  . Heart failure Mother 56    congestive  . Heart attack Father 29  . Alzheimer's disease Brother 42  . Alzheimer's disease Sister 40  . Alcohol abuse Father     History   Social History  . Marital Status: Married    Spouse Name: N/A    Number of Children: 4  . Years of Education: N/A   Occupational History  . retired     [employer]   Social History Main Topics  . Smoking status: Former Smoker -- 3.0 packs/day for 30 years  . Smokeless tobacco: Not on file   Comment: quit in 1982  . Alcohol Use: Yes     occasionally  . Drug Use: No  . Sexually Active: Not on file   Other Topics Concern  . Not on file   Social  History Narrative  . No narrative on file    ROS ALL NEGATIVE EXCEPT THOSE NOTED IN HPI  PE  General Appearance: well developed, well nourished in no acute distress, obese HEENT: symmetrical face, PERRLA, good dentition  Neck: no JVD, thyromegaly, or adenopathy, trachea midline Chest: symmetric without deformity Cardiac: PMI non-displaced, RRR, normal S1, paradoxical S2, no gallop, 2/6 systolic murmur left sternal border and aortic area Lung: clear to ausculation and percussion Vascular: all pulses full without bruits  Abdominal: nondistended, nontender, good bowel sounds, no HSM, no bruits Extremities: no cyanosis, clubbing, 1+ pitting edema, no sign of DVT, superficial varicosities. Skin: normal color, no rashes Neuro: alert and oriented x 3, non-focal Pysch: normal affect  EKG Sinus bradycardia, rate 59, first-degree AV block, left bundle branch block. BMET No results found for  this basename: na, k, cl, co2, glucose, bun, creatinine, calcium, gfrnonaa, gfraa    Lipid Panel  No results found for this basename: chol, trig, hdl, cholhdl, vldl, ldlcalc    CBC No results found for this basename: wbc, rbc, hgb, hct, plt, mcv, mch, mchc, rdw, neutrabs, lymphsabs, monoabs, eosabs, basosabs

## 2011-12-31 NOTE — Assessment & Plan Note (Signed)
This is responded to Lasix as well as support those. I've advised him not to have sclerotherapy.

## 2011-12-31 NOTE — Assessment & Plan Note (Signed)
Asymptomatic. Negative stress Myoview in 2011.

## 2011-12-31 NOTE — Assessment & Plan Note (Signed)
Improved on lower dose of propranolol. Monitor as above.

## 2012-01-03 ENCOUNTER — Ambulatory Visit (HOSPITAL_COMMUNITY): Payer: Medicare Other | Attending: Cardiology | Admitting: Radiology

## 2012-01-03 DIAGNOSIS — I2510 Atherosclerotic heart disease of native coronary artery without angina pectoris: Secondary | ICD-10-CM | POA: Insufficient documentation

## 2012-01-03 DIAGNOSIS — I447 Left bundle-branch block, unspecified: Secondary | ICD-10-CM | POA: Insufficient documentation

## 2012-01-03 DIAGNOSIS — I498 Other specified cardiac arrhythmias: Secondary | ICD-10-CM | POA: Insufficient documentation

## 2012-01-03 DIAGNOSIS — I440 Atrioventricular block, first degree: Secondary | ICD-10-CM | POA: Insufficient documentation

## 2012-01-03 DIAGNOSIS — I517 Cardiomegaly: Secondary | ICD-10-CM | POA: Insufficient documentation

## 2012-01-03 DIAGNOSIS — R079 Chest pain, unspecified: Secondary | ICD-10-CM | POA: Insufficient documentation

## 2012-01-03 DIAGNOSIS — R609 Edema, unspecified: Secondary | ICD-10-CM | POA: Insufficient documentation

## 2012-01-03 NOTE — Progress Notes (Signed)
Echocardiogram performed.  

## 2012-01-06 NOTE — Addendum Note (Signed)
Addended by: Micki Riley C on: 01/06/2012 03:50 PM   Modules accepted: Orders

## 2012-01-10 ENCOUNTER — Telehealth: Payer: Self-pay | Admitting: Cardiology

## 2012-01-10 NOTE — Telephone Encounter (Signed)
Pt aware of ECHO results. Debbie Tilden Broz RN  

## 2012-01-10 NOTE — Telephone Encounter (Signed)
Patient returning nurse call, he can be reached at [phone]

## 2012-03-17 ENCOUNTER — Ambulatory Visit: Payer: Self-pay | Admitting: General Surgery

## 2012-03-17 HISTORY — PX: COLONOSCOPY: SHX174

## 2012-03-17 LAB — HM COLONOSCOPY: HM Colonoscopy: NORMAL

## 2012-12-07 ENCOUNTER — Other Ambulatory Visit: Payer: Self-pay | Admitting: General Surgery

## 2012-12-07 DIAGNOSIS — Z96659 Presence of unspecified artificial knee joint: Secondary | ICD-10-CM

## 2013-01-15 ENCOUNTER — Ambulatory Visit (INDEPENDENT_AMBULATORY_CARE_PROVIDER_SITE_OTHER): Payer: Self-pay | Admitting: Cardiology

## 2013-01-15 ENCOUNTER — Encounter: Payer: Self-pay | Admitting: Cardiology

## 2013-01-15 VITALS — BP 128/64 | HR 47 | Ht 75.0 in | Wt 292.4 lb

## 2013-01-15 DIAGNOSIS — I10 Essential (primary) hypertension: Secondary | ICD-10-CM

## 2013-01-15 DIAGNOSIS — R609 Edema, unspecified: Secondary | ICD-10-CM

## 2013-01-15 DIAGNOSIS — G4730 Sleep apnea, unspecified: Secondary | ICD-10-CM

## 2013-01-15 DIAGNOSIS — I447 Left bundle-branch block, unspecified: Secondary | ICD-10-CM

## 2013-01-15 DIAGNOSIS — I498 Other specified cardiac arrhythmias: Secondary | ICD-10-CM

## 2013-01-15 DIAGNOSIS — I2510 Atherosclerotic heart disease of native coronary artery without angina pectoris: Secondary | ICD-10-CM

## 2013-01-15 DIAGNOSIS — R001 Bradycardia, unspecified: Secondary | ICD-10-CM

## 2013-01-15 DIAGNOSIS — R600 Localized edema: Secondary | ICD-10-CM

## 2013-01-15 MED ORDER — PROPRANOLOL HCL 20 MG PO TABS: 10.0000 mg | ORAL_TABLET | Freq: Every day | ORAL | Status: DC

## 2013-01-15 NOTE — Progress Notes (Signed)
Jared Rice Date of Birth: 29-Oct-1932 Medical Record #[id number]  History of Present Illness: Mr. Gatson is seen today to establish cardiac care. He is a former patient of Dr. Daleen Squibb. He was evaluated 15-20 years ago with complaints of burning chest pain and shortness of breath while mowing the grass. He had a cardiac catheterization which showed nonobstructive coronary disease. Over the years he has had a couple of other cardiac catheterizations, the last in 2005 showed nonobstructive disease with scattered 20-30% lesions. He had a nuclear stress test in January 2012 which was normal. He had an echocardiogram in August 2013 which showed LVH with normal systolic function and mildly elevated PA systolic pressure. Patient has a history of morbid obesity with obstructive sleep apnea. He has not used the CPAP mask in 5 years because he could never get comfortable with this. On followup today he still has some chest discomfort if he exerts. He has a history of bradycardia. He takes propranolol for a tremor. Carotid Doppler studies this past year showed nonobstructive plaque.  Current Outpatient Prescriptions on File Prior to Visit  Medication Sig Dispense Refill  . ampicillin (PRINCIPEN) 500 MG capsule TAKE ALL FOUR CAPSULES BY MOUTH ONE HOUR PRIOR TO COLONOSCOPY  4 capsule  0  . aspirin 325 MG EC tablet Take 325 mg by mouth daily.        [name] doxazosin (CARDURA) 8 MG tablet Take 8 mg by mouth at bedtime.        . furosemide (LASIX) 20 MG tablet Take 20 mg by mouth daily.        [name] levothyroxine (SYNTHROID, LEVOTHROID) 75 MCG tablet Take 75 mcg by mouth daily.        . naproxen sodium (ANAPROX) 220 MG tablet Take 440 mg by mouth daily at 6 (six) AM.        . nitroGLYCERIN (NITROSTAT) 0.4 MG SL tablet Place 0.4 mg under the tongue every 5 (five) minutes as needed.        . quinapril (ACCUPRIL) 40 MG tablet Take 40 mg by mouth at bedtime.      . rosuvastatin (CRESTOR) 20 MG tablet Take 10 mg by mouth  daily.         No current facility-administered medications on file prior to visit.    No Known Allergies  Past Medical History  Diagnosis Date  . Occlusion and stenosis of carotid artery without mention of cerebral infarction   . Precordial pain   . Coronary atherosclerosis of native coronary artery     nonobstructive  . Unspecified sleep apnea   . Other dyspnea and respiratory abnormality     w/ pseuodowheeze resolves with purse lip manuever  . Chronic airway obstruction, not elsewhere classified   . Essential hypertension, benign   . Venous insufficiency   . Glucose intolerance (impaired glucose tolerance)   . Iron deficiency anemia     Past Surgical History  Procedure Laterality Date  . Total knee arthroplasty Left     History  Smoking status  . Former Smoker -- 3.00 packs/day for 30 years  Smokeless tobacco  . Not on file    Comment: quit in 1982    History  Alcohol Use  . Yes    Comment: occasionally    Family History  Problem Relation Age of Onset  . Heart failure Mother 49    congestive  . Heart attack Father 37  . Alzheimer's disease Brother 72  . Alzheimer's disease Sister 14  .  Alcohol abuse Father     Review of Systems: The review of systems is positive for sedentary lifestyle. He plays golf once a week. Otherwise he gets no physical activity. He admits that his diet is poor All other systems were reviewed and are negative.  Physical Exam: BP 128/64  Pulse 47  Ht 6\' 3"  (1.905 m)  Wt 292 lb 6.4 oz (132.632 kg)  BMI 36.55 kg/m2  SpO2 95% He is a morbidly obese white male in no acute distress. HEENT: Normocephalic, atraumatic. Pupils equal round and reactive light accommodation. Extraocular movements are full. Oropharynx is clear. Neck is without JVD, adenopathy, thyromegaly, or bruits. Lungs: Clear Cardiovascular: Regular rate and rhythm. Normal S1 and S2. No gallop, murmur, or click. Abdomen: Morbidly obese, soft, nontender. Bowel sounds  are positive. No masses. Extremities. 1+ lower extremity edema. Chronic hyperpigmentation. Pulses are 2+. Neuro: Alert and oriented x3. Cranial nerves II through XII are intact. LABORATORY DATA: ECG today demonstrates marked sinus bradycardia with a rate of 47 beats per minute. He has a chronic left bundle branch block. Laboratory data was reviewed from January 2014. Complete chemistry panel was normal. Cholesterol 111, triglycerides 61, HDL 41, LDL 58. TSH was 2.24  Assessment / Plan: 1. Marked sinus bradycardia. I recommend a reduction in his propranolol to 10 mg daily. He's taking this for tremor. If he remains significantly bradycardic we may need to discontinue this medication. 2. Coronary disease-nonobstructive. No recent change in symptoms. Continue risk factor modification. 3. Carotid arterial disease-nonobstructive 4. Morbid obesity with obstructive sleep apnea. I recommended increased aerobic activity to 30 minutes of aerobic activity daily. I recommended a heart healthy diet with focus on weight loss. 5. Chronic left bundle branch block. 6. Chronic edema. I recommended sodium restriction and use of compression hose.  I'll followup again in one year.

## 2013-01-15 NOTE — Patient Instructions (Signed)
You need to increase your aerobic activity. You should try and exercise for 30 minutes 5-7 days a week  You need to lose weight. Eat a heart healthy diet.  Reduce propranolol to 10 mg daily

## 2013-06-23 LAB — LIPID PANEL
Cholesterol: 104 mg/dL (ref 0–200)
HDL: 42 mg/dL (ref 35–70)
LDL Cholesterol: 53 mg/dL
LDl/HDL Ratio: 1.3
Triglycerides: 43 mg/dL (ref 40–160)

## 2013-06-23 LAB — BASIC METABOLIC PANEL
BUN: 17 mg/dL (ref 4–21)
CREATININE: 1 mg/dL (ref 0.6–1.3)
Glucose: 105 mg/dL
Potassium: 4.4 mmol/L (ref 3.4–5.3)
Sodium: 145 mmol/L (ref 137–147)

## 2013-06-23 LAB — HEPATIC FUNCTION PANEL
ALT: 8 U/L — AB (ref 10–40)
AST: 10 U/L — AB (ref 14–40)
Alkaline Phosphatase: 71 U/L (ref 25–125)
BILIRUBIN, TOTAL: 0.4 mg/dL

## 2013-07-12 LAB — HEMOGLOBIN A1C: HEMOGLOBIN A1C: 5.9 % (ref 4.0–6.0)

## 2013-10-11 ENCOUNTER — Encounter: Payer: Self-pay | Admitting: Cardiology

## 2013-10-11 ENCOUNTER — Ambulatory Visit (INDEPENDENT_AMBULATORY_CARE_PROVIDER_SITE_OTHER): Payer: Medicare Other | Admitting: Cardiology

## 2013-10-11 VITALS — BP 170/88 | HR 53 | Ht 75.0 in | Wt 293.8 lb

## 2013-10-11 DIAGNOSIS — I498 Other specified cardiac arrhythmias: Secondary | ICD-10-CM

## 2013-10-11 DIAGNOSIS — I2510 Atherosclerotic heart disease of native coronary artery without angina pectoris: Secondary | ICD-10-CM

## 2013-10-11 DIAGNOSIS — R001 Bradycardia, unspecified: Secondary | ICD-10-CM

## 2013-10-11 DIAGNOSIS — I10 Essential (primary) hypertension: Secondary | ICD-10-CM

## 2013-10-11 DIAGNOSIS — I447 Left bundle-branch block, unspecified: Secondary | ICD-10-CM

## 2013-10-11 NOTE — Patient Instructions (Signed)
Continue your current therapy  You need to focus on increasing your aerobic exercise and losing weight.  I will see you in one year.

## 2013-10-12 NOTE — Progress Notes (Signed)
Gerda Diss Cantu Date of Birth: 05-07-1933 Medical Record #[id number]  History of Present Illness: Mr. Youngberg is seen today for follow up.  He has had several cardiac caths over the years revealing nonobstructive disease with scattered 20-30% lesions. He had a nuclear stress test in January 2012 which was normal. He had an echocardiogram in August 2013 which showed LVH with normal systolic function and mildly elevated PA systolic pressure. Patient has a history of morbid obesity with obstructive sleep apnea. He has not used the CPAP mask in 5 years because he could never get comfortable with this. On followup today he states he is doing well. He is concerned about weight gain. Propranolol was discontinued due to bradycardia. He is now taking Primadone for his tremor. No change in his chronic chest pain. There was one visit to his neurologist where his HR was recorded at 89 bpm. He was seen by his primary and his HR was 63 and he was in sinus rhythm. He has no symptoms of palpitations.  Current Outpatient Prescriptions on File Prior to Visit  Medication Sig Dispense Refill  . aspirin 325 MG EC tablet Take 325 mg by mouth daily.        [name] doxazosin (CARDURA) 8 MG tablet Take 8 mg by mouth at bedtime.        . furosemide (LASIX) 20 MG tablet Take 20 mg by mouth daily.        [name] levothyroxine (SYNTHROID, LEVOTHROID) 75 MCG tablet Take 75 mcg by mouth daily.        . nitroGLYCERIN (NITROSTAT) 0.4 MG SL tablet Place 0.4 mg under the tongue every 5 (five) minutes as needed.        . quinapril (ACCUPRIL) 40 MG tablet Take 40 mg by mouth at bedtime.      . rosuvastatin (CRESTOR) 20 MG tablet Take 10 mg by mouth daily.         No current facility-administered medications on file prior to visit.    No Known Allergies  Past Medical History  Diagnosis Date  . Occlusion and stenosis of carotid artery without mention of cerebral infarction   . Precordial pain   . Coronary atherosclerosis of native  coronary artery     nonobstructive  . Unspecified sleep apnea   . Other dyspnea and respiratory abnormality     w/ pseuodowheeze resolves with purse lip manuever  . Chronic airway obstruction, not elsewhere classified   . Essential hypertension, benign   . Venous insufficiency   . Glucose intolerance (impaired glucose tolerance)   . Iron deficiency anemia   . Tremor     Past Surgical History  Procedure Laterality Date  . Total knee arthroplasty Left     History  Smoking status  . Former Smoker -- 3.00 packs/day for 30 years  Smokeless tobacco  . Not on file    Comment: quit in 1982    History  Alcohol Use  . Yes    Comment: occasionally    Family History  Problem Relation Age of Onset  . Heart failure Mother 68    congestive  . Heart attack Father 7  . Alzheimer's disease Brother 47  . Alzheimer's disease Sister 45  . Alcohol abuse Father     Review of Systems: The review of systems is positive for sedentary lifestyle. He plays golf once or twice a week. Otherwise he gets no physical activity. He admits that his diet is poor. All other systems were reviewed  and are negative.  Physical Exam: BP 170/88  Pulse 53  Ht 6\' 3"  (1.905 m)  Wt 293 lb 12.8 oz (133.267 kg)  BMI 36.72 kg/m2 He is a morbidly obese white male in no acute distress. HEENT: Normocephalic, atraumatic. Pupils equal round and reactive light accommodation. Extraocular movements are full. Oropharynx is clear. Neck is without JVD, adenopathy, thyromegaly, or bruits. Lungs: Clear Cardiovascular: Regular rate and rhythm. Normal S1 and S2. No gallop, murmur, or click. Abdomen: Morbidly obese, soft, nontender. Bowel sounds are positive. No masses. Extremities. 1+ lower extremity edema. Chronic hyperpigmentation. Pulses are 2+. Neuro: Alert and oriented x3. Cranial nerves II through XII are intact.LABORATORY DATA: ECG 09/28/13 shows NSR with LBBB.   Assessment / Plan: 1. Marked sinus  bradycardia.Improved off beta blocker therapy. No other arrhythmia noted.  2. Coronary disease-nonobstructive. No recent change in symptoms. Continue risk factor modification. 3. Carotid arterial disease-nonobstructive 4. Morbid obesity with obstructive sleep apnea. I recommended increased aerobic activity to 30 minutes of aerobic activity daily. I recommended a heart healthy diet with focus on weight loss. 5. Chronic left bundle branch block. 6. Chronic edema. I recommended sodium restriction and use of compression hose.  I'll followup again in one year.

## 2013-10-13 ENCOUNTER — Ambulatory Visit: Payer: Self-pay | Admitting: Urology

## 2013-10-13 LAB — BASIC METABOLIC PANEL
Anion Gap: 6 — ABNORMAL LOW (ref 7–16)
BUN: 15 mg/dL (ref 7–18)
CHLORIDE: 105 mmol/L (ref 98–107)
Calcium, Total: 8.6 mg/dL (ref 8.5–10.1)
Co2: 30 mmol/L (ref 21–32)
Creatinine: 0.87 mg/dL (ref 0.60–1.30)
EGFR (African American): >60
EGFR (Non-African Amer.): >60
GLUCOSE: 98 mg/dL (ref 65–99)
Osmolality: 282 (ref 275–301)
POTASSIUM: 4.4 mmol/L (ref 3.5–5.1)
Sodium: 141 mmol/L (ref 136–145)

## 2013-10-29 ENCOUNTER — Ambulatory Visit: Payer: Self-pay | Admitting: Urology

## 2013-11-16 DIAGNOSIS — M1710 Unilateral primary osteoarthritis, unspecified knee: Secondary | ICD-10-CM | POA: Insufficient documentation

## 2013-11-16 DIAGNOSIS — M179 Osteoarthritis of knee, unspecified: Secondary | ICD-10-CM | POA: Insufficient documentation

## 2013-12-09 ENCOUNTER — Ambulatory Visit: Payer: Self-pay | Admitting: Obstetrics and Gynecology

## 2014-01-28 ENCOUNTER — Ambulatory Visit: Payer: Self-pay | Admitting: Ophthalmology

## 2014-01-28 LAB — POTASSIUM: POTASSIUM: 4.3 mmol/L (ref 3.5–5.1)

## 2014-02-14 ENCOUNTER — Ambulatory Visit: Payer: Self-pay | Admitting: Ophthalmology

## 2014-04-14 LAB — PSA: PSA: 1.6

## 2014-04-27 ENCOUNTER — Ambulatory Visit: Payer: Self-pay | Admitting: Ophthalmology

## 2014-04-27 LAB — POTASSIUM: POTASSIUM: 3.7 mmol/L (ref 3.5–5.1)

## 2014-05-09 ENCOUNTER — Ambulatory Visit: Payer: Self-pay | Admitting: Ophthalmology

## 2014-07-14 ENCOUNTER — Telehealth: Payer: Self-pay | Admitting: Cardiology

## 2014-07-14 DIAGNOSIS — I739 Peripheral vascular disease, unspecified: Principal | ICD-10-CM

## 2014-07-14 DIAGNOSIS — I779 Disorder of arteries and arterioles, unspecified: Secondary | ICD-10-CM

## 2014-07-14 NOTE — Telephone Encounter (Signed)
New Message  Pt calling to sched recall and remembers that he might have needed to do a carotid test but does not remember. No order or recall are in his chart. Pt wanted to double check w/ Rn. Please call back and discuss.

## 2014-07-14 NOTE — Telephone Encounter (Signed)
Returned call to patient no answer.Left message on personal voice mail schedulers will call back to schedule carotid dopplers.

## 2014-07-20 ENCOUNTER — Ambulatory Visit (HOSPITAL_COMMUNITY)
Admission: RE | Admit: 2014-07-20 | Discharge: 2014-07-20 | Disposition: A | Discharge status: Home / Self Care | Payer: PPO | Source: Ambulatory Visit | Attending: Cardiology | Admitting: Cardiology

## 2014-07-20 DIAGNOSIS — I739 Peripheral vascular disease, unspecified: Secondary | ICD-10-CM

## 2014-07-20 DIAGNOSIS — I779 Disorder of arteries and arterioles, unspecified: Secondary | ICD-10-CM

## 2014-07-20 NOTE — Progress Notes (Signed)
Carotid Duplex Completed. Stable mild plaque in both ICAs with no evidence of a stenosis.  Oda Cogan, BS, RDMS, RVT

## 2014-09-17 NOTE — Op Note (Signed)
PATIENT NAME:  Jared [name], Jared [name] MR#:  [id number] DATE OF BIRTH:  May 08, 1933  DATE OF PROCEDURE:  02/14/2014  PREOPERATIVE DIAGNOSIS: Cataract, right eye.   POSTOPERATIVE DIAGNOSIS: Cataract, left eye.   PROCEDURE PERFORMED: Extracapsular cataract extraction using phacoemulsification with placement Alcon [id number], 21.0 diopter posterior chamber lens, serial number [id number].   SURGEON: Loura Back. Rayan Ines, MD    ANESTHESIA: 4% lidocaine and 0.75% Marcaine, a 50-50 mixture with 10 units/mL of Hylenex added, given as a peribulbar.   ANESTHESIOLOGIST:  Elyse Hsu, MD   COMPLICATIONS: None.   ESTIMATED BLOOD LOSS: Less than 1 mL.   DESCRIPTION OF PROCEDURE:  The patient was brought to the operating room and given a peribulbar block.  The patient was then prepped and draped in the usual fashion.  The vertical rectus muscles were imbricated using 5-0 silk sutures.  These sutures were then clamped to the sterile drapes as bridle sutures.  A limbal peritomy was performed extending two clock hours and hemostasis was obtained with cautery.  A partial thickness scleral groove was made at the surgical limbus and dissected anteriorly in a lamellar dissection using an Alcon crescent knife.  The anterior chamber was entered superonasally with a Superblade and through the lamellar dissection with a 2.6 mm keratome.  DisCoVisc was used to replace the aqueous and a continuous tear capsulorrhexis was carried out.  Hydrodissection and hydrodelineation were carried out with balanced salt and a 27 gauge canula.  The nucleus was rotated to confirm the effectiveness of the hydrodissection.  Phacoemulsification was carried out using a divide-and-conquer technique.  Total ultrasound time was 1 minute 42 seconds with an average power of 25.4%, CDE of 41.11.  No sutures placed.  Irrigation/aspiration was used to remove the residual cortex.  DisCoVisc was used to inflate the capsule and the internal incision was enlarged  to 3 mm with the crescent knife.  The intraocular lens was folded and inserted into the capsular bag using the AcrySert delivery system.  Irrigation/aspiration was used to remove the residual DisCoVisc.  Miostat was injected into the anterior chamber through the paracentesis track to inflate the anterior chamber and induce miosis.  A 0.10 mL of cefuroxime was injected via the paracentesis track and 1 mg of the drug via the paracentesis tract. The wound was checked for leaks and none were found. The conjunctiva was closed with cautery and the bridle sutures were removed.  Two drops of 0.3% Vigamox were placed on the eye.   An eye shield was placed on the eye.  The patient was discharged to the recovery room in good condition.     ____________________________ Loura Back Neville Pauls, MD sad:DT D: 02/14/2014 13:07:00 ET T: 02/14/2014 13:25:55 ET JOB#: [id number]  cc: Remo Lipps A. Sharie Amorin, MD, <Dictator> Martie Lee MD ELECTRONICALLY SIGNED 02/21/2014 13:22

## 2014-09-17 NOTE — Op Note (Signed)
PATIENT NAME:  Jared [name], BLANN MR#:  [id number] DATE OF BIRTH:  [birth year]  PREOPERATIVE DIAGNOSIS:  Cataract, left eye.    POSTOPERATIVE DIAGNOSIS:  Cataract, left eye.  PROCEDURE PERFORMED:  Extracapsular cataract extraction using phacoemulsification with placement of an Alcon [id number], 21.0 -diopter posterior chamber lens, serial # [id number].  SURGEON:  Loura Back. Miller Limehouse, MD  ASSISTANT:  None.  ANESTHESIA:  4% lidocaine and 0.75% Marcaine in a 50/50 mixture of 10 units/mL of Hylenex added, given as a peribulbar.  ANESTHESIOLOGIST:  Velora Heckler. Polin, MD  COMPLICATIONS:  None.  ESTIMATED BLOOD LOSS:  Less than 1 ml.  DESCRIPTION OF PROCEDURE:  The patient was brought to the operating room and given a peribulbar block.  The patient was then prepped and draped in the usual fashion.  The vertical rectus muscles were imbricated using 5-0 silk sutures.  These sutures were then clamped to the sterile drapes as bridle sutures.  A limbal peritomy was performed extending two clock hours and hemostasis was obtained with cautery.  A partial thickness scleral groove was made at the surgical limbus and dissected anteriorly in a lamellar dissection using an Alcon crescent knife.  The anterior chamber was entered supero-temporally with a Superblade and through the lamellar dissection with a 2.6 mm keratome.  DisCoVisc was used to replace the aqueous and a continuous tear capsulorrhexis was carried out.  Hydrodissection and hydrodelineation were carried out with balanced salt and a 27 gauge canula.  The nucleus was rotated to confirm the effectiveness of the hydrodissection.  Phacoemulsification was carried out using a divide-and-conquer technique.  Total ultrasound time was 1 minutes and 24 seconds with an average power of 25.9% percent.  Irrigation/aspiration was used to remove the residual cortex.  DisCoVisc was used to inflate the capsule and the internal incision was enlarged to 3 mm with the  crescent knife.  The intraocular lens was folded and inserted into the capsular bag using the Ocusert delivery system.  Irrigation/aspiration was used to remove the residual DisCoVisc.  Miostat was injected into the anterior chamber through the paracentesis track to inflate the anterior chamber and induce miosis.  0.1 mL of cefuroxime containing 1 mg of drug was injected via the paracentesis tract. The wound was checked for leaks and none were found. The conjunctiva was closed with cautery and the bridle sutures were removed.  Two drops of 0.3% Vigamox were placed on the eye.   An eye shield was placed on the eye.  The patient was discharged to the recovery room in good condition.   ____________________________ Loura Back Leiam Hopwood, MD sad:ap D: 05/09/2014 14:01:21 ET T: 05/09/2014 14:35:38 ET JOB#: [id number]  cc: Remo Lipps A. Tesia Lybrand, MD, <Dictator> Martie Lee MD ELECTRONICALLY SIGNED 05/16/2014 10:09

## 2014-09-17 NOTE — Op Note (Signed)
PATIENT NAME:  [name], Jared MR#:  [id number] DATE OF BIRTH:  1932/06/02  DATE OF PROCEDURE:  10/29/2013  PREOPERATIVE DIAGNOSIS: Right spermatocele.   POSTOPERATIVE DIAGNOSIS: Right hydrocele.  PROCEDURE: Right hydrocelectomy.   SURGEON: Rick Duff, DO  ANESTHESIA: General.   COMPLICATIONS: None.   BLOOD LOSS: Less than 3 mL.  DESCRIPTION OF PROCEDURE: With the patient sterilely prepped and draped in supine position and the rather large spermatocele elevated into the field, we do an appropriate timeout and inject 3 mL of 0.5% plain Marcaine into the incision site, in the median raphe, carry the incision through the median raphe through the various layers beneath the scrotum and the tunica vaginalis to the testicle. The spermatocele is opened and drained, and it appears to be not a spermatocele but a hydrocele, so I imbricate laterally the edges of the hydrocele sac, suture them together posterior to the testicle. I used a running 3-0 chromic for this. Once this is done, there is minimal bleeding. I placed the testicle back in its proper anatomic position in the scrotum, inject the cord with another 4 mL of plain Marcaine and close the incision utilizing a running 3-0 Vicryl suture along the tunica vaginalis and then used the same suture to close the skin in a contiguous fashion. Dermabond is placed. Sterile dressings are placed. No drain is utilized as there has been absolutely zero bleeding. The patient is sent to recovery in satisfactory condition with sterile dressings in place. Appropriate timeout was done for this patient. All 3 professional groups agreed; the nurse, the surgeon, and the anesthetist, as to the procedure and the side.  The patient had been marked preoperatively.   ____________________________ Janice Coffin. Elnoria Howard, DO rdh:sb D: 10/29/2013 12:56:08 ET T: 10/29/2013 13:17:08 ET JOB#: [id number]  cc: Janice Coffin. Elnoria Howard, DO, <Dictator> Olita Takeshita D Jazzalynn Rhudy DO ELECTRONICALLY SIGNED  11/05/2013 15:42

## 2014-09-21 ENCOUNTER — Encounter: Payer: Self-pay | Admitting: Emergency Medicine

## 2014-09-21 DIAGNOSIS — M791 Myalgia, unspecified site: Secondary | ICD-10-CM | POA: Insufficient documentation

## 2014-09-21 DIAGNOSIS — E559 Vitamin D deficiency, unspecified: Secondary | ICD-10-CM | POA: Insufficient documentation

## 2014-09-21 DIAGNOSIS — K5790 Diverticulosis of intestine, part unspecified, without perforation or abscess without bleeding: Secondary | ICD-10-CM | POA: Insufficient documentation

## 2014-09-21 DIAGNOSIS — L719 Rosacea, unspecified: Secondary | ICD-10-CM | POA: Insufficient documentation

## 2014-09-21 DIAGNOSIS — C8590 Non-Hodgkin lymphoma, unspecified, unspecified site: Secondary | ICD-10-CM | POA: Insufficient documentation

## 2014-09-21 DIAGNOSIS — Z87891 Personal history of nicotine dependence: Secondary | ICD-10-CM | POA: Insufficient documentation

## 2014-09-21 DIAGNOSIS — G250 Essential tremor: Secondary | ICD-10-CM | POA: Insufficient documentation

## 2014-09-21 DIAGNOSIS — R251 Tremor, unspecified: Secondary | ICD-10-CM | POA: Insufficient documentation

## 2014-09-21 DIAGNOSIS — H269 Unspecified cataract: Secondary | ICD-10-CM | POA: Insufficient documentation

## 2014-09-21 DIAGNOSIS — C4490 Unspecified malignant neoplasm of skin, unspecified: Secondary | ICD-10-CM | POA: Insufficient documentation

## 2014-09-21 DIAGNOSIS — G629 Polyneuropathy, unspecified: Secondary | ICD-10-CM | POA: Insufficient documentation

## 2014-09-21 DIAGNOSIS — K219 Gastro-esophageal reflux disease without esophagitis: Secondary | ICD-10-CM | POA: Insufficient documentation

## 2014-09-21 DIAGNOSIS — J449 Chronic obstructive pulmonary disease, unspecified: Secondary | ICD-10-CM | POA: Insufficient documentation

## 2014-09-21 DIAGNOSIS — R002 Palpitations: Secondary | ICD-10-CM | POA: Insufficient documentation

## 2014-09-21 DIAGNOSIS — E119 Type 2 diabetes mellitus without complications: Secondary | ICD-10-CM | POA: Insufficient documentation

## 2014-09-21 DIAGNOSIS — R200 Anesthesia of skin: Secondary | ICD-10-CM | POA: Insufficient documentation

## 2014-09-21 DIAGNOSIS — M75100 Unspecified rotator cuff tear or rupture of unspecified shoulder, not specified as traumatic: Secondary | ICD-10-CM | POA: Insufficient documentation

## 2014-09-21 DIAGNOSIS — E669 Obesity, unspecified: Secondary | ICD-10-CM | POA: Insufficient documentation

## 2014-09-21 DIAGNOSIS — R5381 Other malaise: Secondary | ICD-10-CM | POA: Insufficient documentation

## 2014-09-21 DIAGNOSIS — M1990 Unspecified osteoarthritis, unspecified site: Secondary | ICD-10-CM | POA: Insufficient documentation

## 2014-09-21 DIAGNOSIS — G589 Mononeuropathy, unspecified: Secondary | ICD-10-CM | POA: Insufficient documentation

## 2014-09-21 DIAGNOSIS — K635 Polyp of colon: Secondary | ICD-10-CM | POA: Insufficient documentation

## 2014-09-21 DIAGNOSIS — E785 Hyperlipidemia, unspecified: Secondary | ICD-10-CM | POA: Insufficient documentation

## 2014-09-21 DIAGNOSIS — E039 Hypothyroidism, unspecified: Secondary | ICD-10-CM | POA: Insufficient documentation

## 2014-09-21 DIAGNOSIS — R5383 Other fatigue: Secondary | ICD-10-CM

## 2014-09-21 DIAGNOSIS — R7303 Prediabetes: Secondary | ICD-10-CM | POA: Insufficient documentation

## 2014-09-21 DIAGNOSIS — K449 Diaphragmatic hernia without obstruction or gangrene: Secondary | ICD-10-CM | POA: Insufficient documentation

## 2014-09-21 DIAGNOSIS — B353 Tinea pedis: Secondary | ICD-10-CM | POA: Insufficient documentation

## 2014-09-21 DIAGNOSIS — K649 Unspecified hemorrhoids: Secondary | ICD-10-CM | POA: Insufficient documentation

## 2014-09-21 DIAGNOSIS — I878 Other specified disorders of veins: Secondary | ICD-10-CM | POA: Insufficient documentation

## 2014-09-21 DIAGNOSIS — R202 Paresthesia of skin: Secondary | ICD-10-CM

## 2014-09-21 DIAGNOSIS — I10 Essential (primary) hypertension: Secondary | ICD-10-CM | POA: Insufficient documentation

## 2014-10-25 ENCOUNTER — Ambulatory Visit: Payer: Medicare Other | Admitting: Cardiology

## 2014-10-27 ENCOUNTER — Encounter: Payer: Self-pay | Admitting: Family Medicine

## 2014-10-27 ENCOUNTER — Ambulatory Visit (INDEPENDENT_AMBULATORY_CARE_PROVIDER_SITE_OTHER): Payer: PPO | Admitting: Family Medicine

## 2014-10-27 VITALS — BP 150/82 | HR 64 | Temp 98.5°F | Resp 16 | Ht 75.0 in | Wt 288.0 lb

## 2014-10-27 DIAGNOSIS — E119 Type 2 diabetes mellitus without complications: Secondary | ICD-10-CM | POA: Diagnosis not present

## 2014-10-27 DIAGNOSIS — E039 Hypothyroidism, unspecified: Secondary | ICD-10-CM

## 2014-10-27 DIAGNOSIS — E785 Hyperlipidemia, unspecified: Secondary | ICD-10-CM | POA: Diagnosis not present

## 2014-10-27 DIAGNOSIS — I10 Essential (primary) hypertension: Secondary | ICD-10-CM

## 2014-10-27 NOTE — Progress Notes (Signed)
   Subjective:    Patient ID: Jared Rice, male    DOB: [birth year], 81 y.o.   MRN: [id number]  Diabetes He presents for his follow-up diabetic visit. He has type 2 diabetes mellitus. His disease course has been stable. Hypoglycemia symptoms include tremors (Not associated with his DM, sees neurology). Pertinent negatives for hypoglycemia include no confusion, dizziness, headaches, mood changes, nervousness/anxiousness, seizures, sleepiness, speech difficulty or sweats. Associated symptoms include chest pain and polyuria. Pertinent negatives for diabetes include no blurred vision, no fatigue, no foot paresthesias, no foot ulcerations, no polydipsia, no polyphagia, no visual change, no weakness and no weight loss. Pertinent negatives for hypoglycemia complications include no blackouts and no nocturnal hypoglycemia.  Hypertension This is a chronic problem. The problem is unchanged. Associated symptoms include chest pain, peripheral edema (wears compression hose) and shortness of breath. Pertinent negatives include no anxiety, blurred vision, headaches, malaise/fatigue, neck pain, orthopnea, palpitations or sweats.  Hyperlipidemia This is a chronic problem. The problem is controlled. Recent lipid tests were reviewed and are normal. Exacerbating diseases include diabetes, hypothyroidism and obesity. Associated symptoms include chest pain and shortness of breath.      Review of Systems  Constitutional: Negative for weight loss, malaise/fatigue and fatigue.  Eyes: Negative for blurred vision.  Respiratory: Positive for shortness of breath.   Cardiovascular: Positive for chest pain. Negative for palpitations and orthopnea.  Endocrine: Positive for polyuria. Negative for polydipsia and polyphagia.  Musculoskeletal: Negative for neck pain.  Neurological: Positive for tremors (Not associated with his DM, sees neurology). Negative for dizziness, seizures, speech difficulty, weakness and headaches.   Psychiatric/Behavioral: Negative for confusion. The patient is not nervous/anxious.        Objective:   Physical Exam  Constitutional: He is oriented to person, place, and time. He appears well-developed and well-nourished.  HENT:  Head: Normocephalic and atraumatic.  Eyes: Conjunctivae and EOM are normal. Pupils are equal, round, and reactive to light.  Neck: Normal range of motion. Neck supple.  Cardiovascular: Normal rate, regular rhythm and normal heart sounds.   Pulmonary/Chest: Effort normal and breath sounds normal.  Musculoskeletal: Normal range of motion.  Neurological: He is alert and oriented to person, place, and time. He has normal reflexes.  Psychiatric: He has a normal mood and affect. His behavior is normal. Judgment and thought content normal.          Assessment & Plan:  Type 2 diabetes/prediabetes.  Diet and exercise stress to patient.  Obesity  Hypertension Controlled   Hypothyroidism  Essential tremor.  Hand tremor getting worse. Patient would like to restart propanolol. Try twice a day at low dose and recheck in 2 months. Pt  warned about bradycardia and orthostasis and we'll watch for   I have done the exam and reviewed the above chart and it is accurate to the best of my knowledge.

## 2014-11-01 LAB — CBC WITH DIFFERENTIAL/PLATELET
BASOS: 0 %
Basophils Absolute: 0 10*3/uL (ref 0.0–0.2)
EOS (ABSOLUTE): 0.4 10*3/uL (ref 0.0–0.4)
Eos: 6 %
Hematocrit: 36.5 % — ABNORMAL LOW (ref 37.5–51.0)
Hemoglobin: 12.3 g/dL — ABNORMAL LOW (ref 12.6–17.7)
IMMATURE GRANS (ABS): 0 10*3/uL (ref 0.0–0.1)
Immature Granulocytes: 0 %
LYMPHS ABS: 1.4 10*3/uL (ref 0.7–3.1)
LYMPHS: 22 %
MCH: 29.4 pg (ref 26.6–33.0)
MCHC: 33.7 g/dL (ref 31.5–35.7)
MCV: 87 fL (ref 79–97)
MONOCYTES: 7 %
Monocytes Absolute: 0.5 10*3/uL (ref 0.1–0.9)
Neutrophils Absolute: 4.1 10*3/uL (ref 1.4–7.0)
Neutrophils: 65 %
PLATELETS: 153 10*3/uL (ref 150–379)
RBC: 4.19 x10E6/uL (ref 4.14–5.80)
RDW: 15 % (ref 12.3–15.4)
WBC: 6.3 10*3/uL (ref 3.4–10.8)

## 2014-11-01 LAB — HEMOGLOBIN A1C
ESTIMATED AVERAGE GLUCOSE: 137 mg/dL
Hgb A1c MFr Bld: 6.4 % — ABNORMAL HIGH (ref 4.8–5.6)

## 2014-11-02 ENCOUNTER — Ambulatory Visit (INDEPENDENT_AMBULATORY_CARE_PROVIDER_SITE_OTHER): Payer: PPO | Admitting: Cardiology

## 2014-11-02 ENCOUNTER — Encounter: Payer: Self-pay | Admitting: Cardiology

## 2014-11-02 VITALS — BP 138/86 | HR 58 | Ht 74.0 in | Wt 281.0 lb

## 2014-11-02 DIAGNOSIS — I25118 Atherosclerotic heart disease of native coronary artery with other forms of angina pectoris: Secondary | ICD-10-CM | POA: Diagnosis not present

## 2014-11-02 DIAGNOSIS — R001 Bradycardia, unspecified: Secondary | ICD-10-CM

## 2014-11-02 DIAGNOSIS — I10 Essential (primary) hypertension: Secondary | ICD-10-CM | POA: Diagnosis not present

## 2014-11-02 DIAGNOSIS — I447 Left bundle-branch block, unspecified: Secondary | ICD-10-CM | POA: Diagnosis not present

## 2014-11-02 LAB — COMPREHENSIVE METABOLIC PANEL
ALK PHOS: 67 IU/L (ref 39–117)
ALT: 8 IU/L (ref 0–44)
AST: 9 IU/L (ref 0–40)
Albumin/Globulin Ratio: 2.2 (ref 1.1–2.5)
Albumin: 4 g/dL (ref 3.5–4.8)
BILIRUBIN TOTAL: 0.4 mg/dL (ref 0.0–1.2)
BUN / CREAT RATIO: 15 (ref 10–22)
BUN: 14 mg/dL (ref 8–27)
CHLORIDE: 102 mmol/L (ref 97–108)
CO2: 28 mmol/L (ref 18–29)
Calcium: 8.7 mg/dL (ref 8.6–10.2)
Creatinine, Ser: 0.94 mg/dL (ref 0.76–1.27)
GFR calc Af Amer: 89 mL/min/{1.73_m2} (ref >59)
GFR, EST NON AFRICAN AMERICAN: 77 mL/min/{1.73_m2} (ref >59)
Globulin, Total: 1.8 g/dL (ref 1.5–4.5)
Glucose: 99 mg/dL (ref 65–99)
Potassium: 4.6 mmol/L (ref 3.5–5.2)
SODIUM: 142 mmol/L (ref 134–144)
TOTAL PROTEIN: 5.8 g/dL — AB (ref 6.0–8.5)

## 2014-11-02 LAB — HDL CHOLESTEROL: HDL: 42 mg/dL (ref >39)

## 2014-11-02 LAB — TSH: TSH: 5.58 u[IU]/mL — ABNORMAL HIGH (ref 0.450–4.500)

## 2014-11-02 MED ORDER — NITROGLYCERIN 0.4 MG SL SUBL: 0.4000 mg | SUBLINGUAL_TABLET | SUBLINGUAL | Status: DC | PRN

## 2014-11-02 NOTE — Patient Instructions (Signed)
Continue your current therapy  I sent in a Rx for Ntg.   I will see you in one year.

## 2014-11-02 NOTE — Progress Notes (Signed)
Jared Rice Date of Birth: Feb 26, 1933 Medical Record #[id number]  History of Present Illness: Jared Rice is seen today for follow up angina.  He has had several cardiac caths over the years revealing nonobstructive disease with scattered 20-30% lesions. He had a nuclear stress test in January 2012 which was normal. He had an echocardiogram in August 2013 which showed LVH with normal systolic function and mildly elevated PA systolic pressure. Patient has a history of morbid obesity with obstructive sleep apnea. He has not used the CPAP  because he could never get comfortable with this. On followup today he states he is doing well. He has lost 12 lbs.  Propranolol was discontinued due to bradycardia. He still experiences chest pain and burning in the mid chest with exertion but states it has been this way for the past 10-15 years.   Current Outpatient Prescriptions on File Prior to Visit  Medication Sig Dispense Refill  . Acetaminophen (TYLENOL PO) Take 500 mg by mouth at bedtime as needed and may repeat dose one time if needed.     [name] aspirin 325 MG EC tablet Take 325 mg by mouth daily.      [name] doxazosin (CARDURA) 8 MG tablet Take 8 mg by mouth at bedtime.      . furosemide (LASIX) 20 MG tablet Take by mouth.    . levothyroxine (SYNTHROID, LEVOTHROID) 75 MCG tablet Take 75 mcg by mouth daily.      . Naproxen Sodium (ALEVE PO) Take by mouth as needed.    . quinapril (ACCUPRIL) 40 MG tablet Take 40 mg by mouth at bedtime.    . rosuvastatin (CRESTOR) 20 MG tablet Take 10 mg by mouth daily.       No current facility-administered medications on file prior to visit.    Allergies  Allergen Reactions  . Primidone Itching and Rash    rash    Past Medical History  Diagnosis Date  . Occlusion and stenosis of carotid artery without mention of cerebral infarction   . Precordial pain   . Coronary atherosclerosis of native coronary artery     nonobstructive  . Unspecified sleep apnea   . Other  dyspnea and respiratory abnormality     w/ pseuodowheeze resolves with purse lip manuever  . Chronic airway obstruction, not elsewhere classified   . Essential hypertension, benign   . Venous insufficiency   . Glucose intolerance (impaired glucose tolerance)   . Iron deficiency anemia   . Tremor     Past Surgical History  Procedure Laterality Date  . Total knee arthroplasty Left   . Eye surgery      History  Smoking status  . Former Smoker -- 2.00 packs/day for 31 years  . Types: Cigarettes  . Quit date: 12/24/1980  Smokeless tobacco  . Not on file    Comment: quit in 1982    History  Alcohol Use No    Comment: occasionally    Family History  Problem Relation Age of Onset  . Heart failure Mother 66    congestive  . Heart attack Father 5  . Alcohol abuse Father   . Alzheimer's disease Brother 11  . Alzheimer's disease Sister 64    Review of Systems: The review of systems is positive for sedentary lifestyle. He has pain in right shoulder and knee. All other systems were reviewed and are negative.  Physical Exam: BP 138/86 mmHg  Pulse 58  Ht 6\' 2"  (1.88 m)  Wt 127.461  kg (281 lb)  BMI 36.06 kg/m2 He is a morbidly obese white male in no acute distress. HEENT: Normocephalic, atraumatic. Pupils equal round and reactive light accommodation. Extraocular movements are full. Oropharynx is clear. Neck is without JVD, adenopathy, thyromegaly, or bruits. Lungs: Clear Cardiovascular: Regular rate and rhythm. Normal S1 and S2. No gallop, murmur, or click. Abdomen: Morbidly obese, soft, nontender. Bowel sounds are positive. No masses. Extremities. 1+ lower extremity edema. Chronic hyperpigmentation. Pulses are 2+. Neuro: Alert and oriented x3. Cranial nerves II through XII are intact.  LABORATORY DATA: ECG today shows NSR with LBBB. No change. I have personally reviewed and interpreted this study.  Lab Results  Component Value Date   WBC 6.3 11/01/2014   HGB 10.6*  10/30/2011   HCT 36.5* 11/01/2014   PLT 141* 10/30/2011   GLUCOSE 99 11/01/2014   CHOL 104 06/23/2013   TRIG 43 06/23/2013   HDL 42 11/01/2014   LDLCALC 53 06/23/2013   ALT 8 11/01/2014   AST 9 11/01/2014   NA 142 11/01/2014   K 4.6 11/01/2014   CL 102 11/01/2014   CREATININE 0.94 11/01/2014   BUN 14 11/01/2014   CO2 28 11/01/2014   TSH 5.580* 11/01/2014   PSA 1.6 04/14/2014   HGBA1C 6.4* 11/01/2014      Assessment / Plan: 1. Marked sinus bradycardia.Improved off beta blocker therapy. No other arrhythmia noted.  2. Coronary disease-nonobstructive. Patient has chronic stable angina. No recent change in symptoms. Continue risk factor modification. Refill Ntg sl.  3. Carotid arterial disease-nonobstructive by doppler this year.  4. Morbid obesity with obstructive sleep apnea. I recommended increased aerobic activity. Encourage continued weight loss. 5. Chronic left bundle branch block. 6. Chronic edema secondary to venous insufficiency. I recommended sodium restriction and use of compression hose.  I'll followup again in one year.

## 2014-11-08 ENCOUNTER — Ambulatory Visit (INDEPENDENT_AMBULATORY_CARE_PROVIDER_SITE_OTHER): Payer: PPO | Admitting: Family Medicine

## 2014-11-08 ENCOUNTER — Encounter: Payer: Self-pay | Admitting: Family Medicine

## 2014-11-08 VITALS — BP 118/62 | HR 72 | Temp 97.9°F | Resp 16 | Ht 74.0 in | Wt 280.0 lb

## 2014-11-08 DIAGNOSIS — K922 Gastrointestinal hemorrhage, unspecified: Secondary | ICD-10-CM | POA: Diagnosis not present

## 2014-11-08 LAB — HEMOCCULT GUIAC POC 1CARD (OFFICE): Fecal Occult Blood, POC: POSITIVE — AB

## 2014-11-08 NOTE — Progress Notes (Signed)
Subjective:     Patient ID: [name], male   DOB: 1933/04/16, 81 y.o.   MRN: [id number]  HPI  Chief Complaint  Patient presents with  . Stool Color Change    Patient reports for the past several days he has had abdominal discomfort "gurgling" and black loose stools for the past 3 days. Patient denies vomiting but states that he feels nauseas and is burping alot and is foul odor.   States he takes ASA daily and an occasional Aleve. Denies hx of ulcers. Reports increased frequency of dark loose stool and burping in the last 24 hours. He is accompanied by his wife today.   Review of Systems  Cardiovascular: Negative for chest pain.  Gastrointestinal: Positive for nausea. Negative for vomiting.  Neurological: Positive for light-headedness.       Objective:   Physical Exam  Constitutional: He appears well-developed and well-nourished. No distress.  Pulmonary/Chest: Breath sounds normal.  Abdominal: There is tenderness (mild epigastric ). There is no guarding.  Genitourinary: Guaiac positive stool.       Assessment:    1. Gastrointestinal hemorrhage, unspecified gastritis, unspecified gastrointestinal hemorrhage type  - CBC with Differential/Platelet - POCT occult blood stool - Ambulatory referral to Gastroenterology    Plan:   ER if vomiting of coffee ground material or increase in sx. Dexilant 60 mg. #15 for one daily.

## 2014-11-08 NOTE — Patient Instructions (Addendum)
Start Dexilant one pill daily. Go  To ER if symptoms worsen or throwing up coffee ground material.

## 2014-11-09 LAB — CBC WITH DIFFERENTIAL/PLATELET
Basophils Absolute: 0 10*3/uL (ref 0.0–0.2)
Basos: 0 %
EOS (ABSOLUTE): 0.2 10*3/uL (ref 0.0–0.4)
EOS: 2 %
Hematocrit: 35.1 % — ABNORMAL LOW (ref 37.5–51.0)
Hemoglobin: 11.4 g/dL — ABNORMAL LOW (ref 12.6–17.7)
IMMATURE GRANULOCYTES: 0 %
Immature Grans (Abs): 0 10*3/uL (ref 0.0–0.1)
LYMPHS ABS: 1.6 10*3/uL (ref 0.7–3.1)
Lymphs: 16 %
MCH: 29 pg (ref 26.6–33.0)
MCHC: 32.5 g/dL (ref 31.5–35.7)
MCV: 89 fL (ref 79–97)
Monocytes Absolute: 0.7 10*3/uL (ref 0.1–0.9)
Monocytes: 7 %
Neutrophils Absolute: 7.3 10*3/uL — ABNORMAL HIGH (ref 1.4–7.0)
Neutrophils: 75 %
PLATELETS: 169 10*3/uL (ref 150–379)
RBC: 3.93 x10E6/uL — AB (ref 4.14–5.80)
RDW: 15 % (ref 12.3–15.4)
WBC: 9.8 10*3/uL (ref 3.4–10.8)

## 2014-11-11 ENCOUNTER — Telehealth: Payer: Self-pay

## 2014-11-11 ENCOUNTER — Encounter: Payer: Self-pay | Admitting: Urgent Care

## 2014-11-11 ENCOUNTER — Ambulatory Visit (INDEPENDENT_AMBULATORY_CARE_PROVIDER_SITE_OTHER): Payer: PPO | Admitting: Urgent Care

## 2014-11-11 ENCOUNTER — Other Ambulatory Visit: Payer: Self-pay

## 2014-11-11 VITALS — BP 128/75 | HR 67 | Temp 97.8°F | Ht 75.0 in | Wt 276.3 lb

## 2014-11-11 DIAGNOSIS — K921 Melena: Secondary | ICD-10-CM

## 2014-11-11 NOTE — Patient Instructions (Addendum)
EGD with Dr Allen Norris TO ER IF BLEEDING RETURNS Avoid Anti-inflammatories like Ibuprofen, aleve, advil, etc Bloody Stools Bloody stools often mean that there is a problem in the digestive tract. Your caregiver may use the term "melena" to describe black, tarry, and bad smelling stools or "hematochezia" to describe red or maroon-colored stools. Blood seen in the stool can be caused by bleeding anywhere along the intestinal tract.  A black stool usually means that blood is coming from the upper part of the gastrointestinal tract (esophagus, stomach, or small bowel). Passing maroon-colored stools or bright red blood usually means that blood is coming from lower down in the large bowel or the rectum. However, sometimes massive bleeding in the stomach or small intestine can cause bright red bloody stools.  Consuming black licorice, lead, iron pills, medicines containing bismuth subsalicylate, or blueberries can also cause black stools. Your caregiver can test black stools to see if blood is present. It is important that the cause of the bleeding be found. Treatment can then be started, and the problem can be corrected. Rectal bleeding may not be serious, but you should not assume everything is okay until you know the cause.It is very important to follow up with your caregiver or a specialist in gastrointestinal problems. CAUSES  Blood in the stools can come from various underlying causes.Often, the cause is not found during your first visit. Testing is often needed to discover the cause of bleeding in the gastrointestinal tract. Causes range from simple to serious or even life-threatening.Possible causes include:  Hemorrhoids.These are veins that are full of blood (engorged) in the rectum. They cause pain, inflammation, and may bleed.  Anal fissures.These are areas of painful tearing which may bleed. They are often caused by passing hard stool.  Diverticulosis.These are pouches that form on the colon over  time, with age, and may bleed significantly.  Diverticulitis.This is inflammation in areas with diverticulosis. It can cause pain, fever, and bloody stools, although bleeding is rare.  Proctitis and colitis. These are inflamed areas of the rectum or colon. They may cause pain, fever, and bloody stools.  Polyps and cancer. Colon cancer is a leading cause of preventable cancer death.It often starts out as precancerous polyps that can be removed during a colonoscopy, preventing progression into cancer. Sometimes, polyps and cancer may cause rectal bleeding.  Gastritis and ulcers.Bleeding from the upper gastrointestinal tract (near the stomach) may travel through the intestines and produce black, sometimes tarry, often bad smelling stools. In certain cases, if the bleeding is fast enough, the stools may not be black, but red and the condition may be life-threatening. SYMPTOMS  You may have stools that are bright red and bloody, that are normal color with blood on them, or that are dark black and tarry. In some cases, you may only have blood in the toilet bowl. Any of these cases need medical care. You may also have:  Pain at the anus or anywhere in the rectum.  Lightheadedness or feeling faint.  Extreme weakness.  Nausea or vomiting.  Fever. DIAGNOSIS Your caregiver may use the following methods to find the cause of your bleeding:  Taking a medical history. Age is important. Older people tend to develop polyps and cancer more often. If there is anal pain and a hard, large stool associated with bleeding, a tear of the anus may be the cause. If blood drips into the toilet after a bowel movement, bleeding hemorrhoids may be the problem. The color and frequency of the  bleeding are additional considerations. In most cases, the medical history provides clues, but seldom the final answer.  A visual and finger (digital) exam. Your caregiver will inspect the anal area, looking for tears and  hemorrhoids. A finger exam can provide information when there is tenderness or a growth inside. In men, the prostate is also examined.  Endoscopy. Several types of small, long scopes (endoscopes) are used to view the colon.  In the office, your caregiver may use a rigid, or more commonly, a flexible viewing sigmoidoscope. This exam is called flexible sigmoidoscopy. It is performed in 5 to 10 minutes.  A more thorough exam is accomplished with a colonoscope. It allows your caregiver to view the entire 5 to 6 foot long colon. Medicine to help you relax (sedative) is usually given for this exam. Frequently, a bleeding lesion may be present beyond the reach of the sigmoidoscope. So, a colonoscopy may be the best exam to start with. Both exams are usually done on an outpatient basis. This means the patient does not stay overnight in the hospital or surgery center.  An upper endoscopy may be needed to examine your stomach. Sedation is used and a flexible endoscope is put in your mouth, down to your stomach.  A barium enema X-ray. This is an X-ray exam. It uses liquid barium inserted by enema into the rectum. This test alone may not identify an actual bleeding point. X-rays highlight abnormal shadows, such as those made by lumps (tumors), diverticuli, or colitis. TREATMENT  Treatment depends on the cause of your bleeding.   For bleeding from the stomach or colon, the caregiver doing your endoscopy or colonoscopy may be able to stop the bleeding as part of the procedure.  Inflammation or infection of the colon can be treated with medicines.  Many rectal problems can be treated with creams, suppositories, or warm baths.  Surgery is sometimes needed.  Blood transfusions are sometimes needed if you have lost a lot of blood.  For any bleeding problem, let your caregiver know if you take aspirin or other blood thinners regularly. HOME CARE INSTRUCTIONS   Take any medicines exactly as  prescribed.  Keep your stools soft by eating a diet high in fiber. Prunes (1 to 3 a day) work well for many people.  Drink enough water and fluids to keep your urine clear or pale yellow.  Take sitz baths if advised. A sitz bath is when you sit in a bathtub with warm water for 10 to 15 minutes to soak, soothe, and cleanse the rectal area.  If enemas or suppositories are advised, be sure you know how to use them. Tell your caregiver if you have problems with this.  Monitor your bowel movements to look for signs of improvement or worsening. SEEK MEDICAL CARE IF:   You do not improve in the time expected.  Your condition worsens after initial improvement.  You develop any new symptoms. SEEK IMMEDIATE MEDICAL CARE IF:   You develop severe or prolonged rectal bleeding.  You vomit blood.  You feel weak or faint.  You have a fever. MAKE SURE YOU:  Understand these instructions.  Will watch your condition.  Will get help right away if you are not doing well or get worse. Document Released: 05/03/2002 Document Revised: 08/05/2011 Document Reviewed: 09/28/2010 [facility] Patient Information 2015 [place], [place]. This information is not intended to replace advice given to you by your health care provider. Make sure you discuss any questions you have with your  health care provider.  

## 2014-11-11 NOTE — Telephone Encounter (Signed)
-----   Message from Carmon Ginsberg, [place] sent at 11/09/2014  7:53 AM EDT ----- Mild anemia. Follow up with gastroenterology as scheduled 6/17.

## 2014-11-11 NOTE — Progress Notes (Signed)
Gastroenterology Consultation  Referring Provider:     Jerrol Banana.,* Primary Care Physician:  Wilhemena Durie, MD Primary Gastroenterologist:  Dr. Allen Norris     Reason for Consultation:     Melena         HPI:   Jared Rice is a 81 y.o. y/o male referred for consultation & management of melena by Wilhemena Durie, MD.  Patient states a couple weeks ago he had malaise, fatigue, horrible tasting belches & melena.  He went for a followup & had labs done & was told he was anemic.  He started Dexilant 60mg  daily 4 days ago.  It seems to help him.  His stools turned brown for the first time in 2 weeks just yesterday.  Denies abdominal pain.  He had nausea but never vomited.  Weight stable.  Appetite poor.  Denies any constipation or diarrhea.  Last colonoscopy 02/2012 & patient believes he had 1 polyp years ago & believes he needs 5 yr FU.  He takes ASA daily but stopped 10 days ago.  He was on meloxicam but stopped 3 weeks ago for shoulder pain.  No headache powders.  Nothing makes symptoms better or worse.  Hgb stable as below.  CBC Latest Ref Rng 11/08/2014 11/01/2014 10/30/2011  WBC 3.4 - 10.8 x10E3/uL 9.8 6.3 8.4  Hemoglobin 13.5 - 17.5 g/dL - - 10.6(A)  Hematocrit 37.5 - 51.0 % 35.1(L) 36.5(L) 33(A)  Platelets 150 - 399 K/L - - 141(A)   Past Medical History  Diagnosis Date  . Occlusion and stenosis of carotid artery without mention of cerebral infarction   . Precordial pain   . Coronary atherosclerosis of native coronary artery     nonobstructive  . Unspecified sleep apnea   . Other dyspnea and respiratory abnormality     w/ pseuodowheeze resolves with purse lip manuever  . Chronic airway obstruction, not elsewhere classified   . Essential hypertension, benign   . Venous insufficiency   . Glucose intolerance (impaired glucose tolerance)   . Iron deficiency anemia   . Tremor   . Colon polyps    Past Surgical History  Procedure Laterality Date  . Total knee  arthroplasty Left   . Eye surgery    . Colonoscopy  03/17/12    Dr Byrnett-diverticulosis    Prior to Admission medications    Current Outpatient Prescriptions on File Prior to Visit  Medication Sig Dispense Refill  . dexlansoprazole (DEXILANT) 60 MG capsule Take 60 mg by mouth daily.    [name] doxazosin (CARDURA) 8 MG tablet Take 8 mg by mouth at bedtime.      . furosemide (LASIX) 20 MG tablet Take 20 mg by mouth daily.     [name] levothyroxine (SYNTHROID, LEVOTHROID) 75 MCG tablet Take 75 mcg by mouth daily.      . quinapril (ACCUPRIL) 40 MG tablet Take 40 mg by mouth at bedtime.    . rosuvastatin (CRESTOR) 20 MG tablet Take 10 mg by mouth at bedtime.      No current facility-administered medications on file prior to visit.     Family History  Problem Relation Age of Onset  . Heart failure Mother 75    congestive  . Heart attack Father 48  . Alcohol abuse Father   . Alzheimer's disease Brother 9  . Alzheimer's disease Sister 33  . Colon cancer Neg Hx   . 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 Smoking status: Former Smoker -- 2.00 packs/day for 31 years    Types: Cigarettes    Quit date: 12/24/1980  . Smokeless tobacco: Not on file     Comment: quit in 1982  . Alcohol Use: No     Comment: occasionally    Allergies as of 11/11/2014 - Review Complete 11/11/2014  Allergen Reaction Noted  . Primidone Itching and Rash 09/21/2014    Review of Systems:    All systems reviewed and negative except where noted in HPI.   Physical Exam:  BP 128/75 mmHg  Pulse 67  Temp(Src) 97.8 F (36.6 C) (Oral)  Ht 6\' 3"  (1.905 m)  Wt 276 lb 4.8 oz (125.329 kg)  BMI 34.54 kg/m2 No LMP for male patient. General:   Alert,  Well-developed, obese, pleasant and cooperative in NAD, accompanied by his wife Head:  Normocephalic and atraumatic. Eyes:  Sclera clear,  no icterus.   Conjunctiva pink. Ears:  Normal auditory acuity. Nose:  No deformity, discharge, or lesions. Mouth:  No deformity or lesions,oropharynx pink & moist. Neck:  Supple; no masses or thyromegaly. Lungs:  Respirations even and unlabored.  Clear throughout to auscultation.   No wheezes, crackles, or rhonchi. No acute distress. Heart:  Regular rate and rhythm; no murmurs, clicks, rubs, or gallops. Abdomen:  Obese, Normal bowel sounds.  No bruits.  Soft, non-tender and non-distended without masses, hepatosplenomegaly or hernias noted.  No guarding or rebound tenderness.     Rectal:  Deferred.  Msk:  Symmetrical without gross deformities.  Good, equal movement & strength bilaterally. Pulses:  Normal pulses noted. Extremities:  No clubbing or edema.  No cyanosis. Neurologic:  Alert and oriented x3;  grossly normal neurologically. Skin:  Intact without significant lesions or rashes.  No jaundice. Lymph Nodes:  No significant cervical adenopathy. Psych:  Alert and cooperative. Normal mood and affect.  Imaging Studies: No results found.

## 2014-11-11 NOTE — Telephone Encounter (Signed)
Patient was advised he states he saw specialist this morning he has an endoscopy scheduled for 11/25/14 he states that specialist review over report and states that she had no concerns about anemia at this time.

## 2014-11-12 ENCOUNTER — Encounter: Payer: Self-pay | Admitting: Urgent Care

## 2014-11-12 DIAGNOSIS — K921 Melena: Secondary | ICD-10-CM | POA: Insufficient documentation

## 2014-11-12 NOTE — Assessment & Plan Note (Signed)
Jared Rice is a pleasant 81 y.o. male with melena.  EGD ASAP with Dr Allen Norris.  Differentials include PUD, AVM or gastritis.  I have discussed risks & benefits which include, but are not limited to, bleeding, infection, perforation & drug reaction. The patient agrees with this plan & written consent will be obtained.  Avoid NSAIDS Dexilant 60mg  daily TO ER if bleeding, dizziness, SOB or pain

## 2014-11-16 ENCOUNTER — Telehealth: Payer: Self-pay | Admitting: Urgent Care

## 2014-11-16 ENCOUNTER — Telehealth: Payer: Self-pay | Admitting: Family Medicine

## 2014-11-16 NOTE — Telephone Encounter (Signed)
Patient had called inquiring about his prescription Dexilant, if it had been called into the [facility] on [address]. Herb Grays called the prescription in and we let pt know.

## 2014-11-18 ENCOUNTER — Encounter: Payer: Self-pay | Admitting: *Deleted

## 2014-11-22 ENCOUNTER — Telehealth: Payer: Self-pay | Admitting: Urgent Care

## 2014-11-22 ENCOUNTER — Other Ambulatory Visit: Payer: Self-pay | Admitting: Urgent Care

## 2014-11-22 NOTE — Telephone Encounter (Signed)
agree I DC'd med & added to pt's allergy list thanks

## 2014-11-22 NOTE — Telephone Encounter (Signed)
Patient was prescribed Dexilant last week by  Vickey Huger, however his PCP had given him samples so he has been taking this a total of 15 days.  last night he started having a reaction and broke out in a rash, he is concerned he may be having a reaction to the Dora - he is scheduled for an endoscopy on Friday. Please call and advise.

## 2014-11-22 NOTE — Telephone Encounter (Signed)
Called patient back and he stated that he started this rash last night. He stated that his rash is itching some and it looks like "little bumps" on his upper neck and chest. Patient stated that Dexilant is the only new medication that he recently started 15 days ago. I asked patient if his rash was painful and he stated that it was not. I also asked if he was having SOB and he stated that he was breathing fine with no difficulty. I then asked patient if by any chance he had used new clothes detergent, shampoo, body wash, or lotion and he stated that he has not. I told him to stop taking Dexilant as of today. I also recommended for him to take Benadryl but if this wouldn't help, to call his PCP to be seen at there office. I recommended to be seen at the ED if he started experiencing SOB. Patient understood what was recommended.  I also told patient to continue with his Endoscopy and he stated that he was still going to keep his appt because he wants to know why he's been having Melena. Brennan Bailey, on your last note, you only requested for an EGD since he had a colonoscopy on 2013.

## 2014-11-25 ENCOUNTER — Ambulatory Visit: Payer: PPO | Admitting: Anesthesiology

## 2014-11-25 ENCOUNTER — Encounter
Admission: RE | Disposition: A | Discharge status: Home / Self Care | Payer: Self-pay | Source: Ambulatory Visit | Attending: Gastroenterology

## 2014-11-25 ENCOUNTER — Other Ambulatory Visit: Payer: Self-pay | Admitting: Gastroenterology

## 2014-11-25 ENCOUNTER — Other Ambulatory Visit: Payer: Self-pay | Admitting: Physician Assistant

## 2014-11-25 ENCOUNTER — Encounter: Payer: Self-pay | Admitting: *Deleted

## 2014-11-25 ENCOUNTER — Ambulatory Visit
Admission: RE | Admit: 2014-11-25 | Discharge: 2014-11-25 | Disposition: A | Discharge status: Home / Self Care | Payer: PPO | Source: Ambulatory Visit | Attending: Gastroenterology | Admitting: Gastroenterology

## 2014-11-25 DIAGNOSIS — G4730 Sleep apnea, unspecified: Secondary | ICD-10-CM | POA: Diagnosis not present

## 2014-11-25 DIAGNOSIS — Z811 Family history of alcohol abuse and dependence: Secondary | ICD-10-CM | POA: Insufficient documentation

## 2014-11-25 DIAGNOSIS — K921 Melena: Secondary | ICD-10-CM | POA: Diagnosis not present

## 2014-11-25 DIAGNOSIS — Z9842 Cataract extraction status, left eye: Secondary | ICD-10-CM | POA: Diagnosis not present

## 2014-11-25 DIAGNOSIS — Z86010 Personal history of colonic polyps: Secondary | ICD-10-CM | POA: Insufficient documentation

## 2014-11-25 DIAGNOSIS — Z96652 Presence of left artificial knee joint: Secondary | ICD-10-CM | POA: Diagnosis not present

## 2014-11-25 DIAGNOSIS — B9681 Helicobacter pylori [H. pylori] as the cause of diseases classified elsewhere: Secondary | ICD-10-CM

## 2014-11-25 DIAGNOSIS — M7541 Impingement syndrome of right shoulder: Secondary | ICD-10-CM

## 2014-11-25 DIAGNOSIS — R0600 Dyspnea, unspecified: Secondary | ICD-10-CM | POA: Insufficient documentation

## 2014-11-25 DIAGNOSIS — I872 Venous insufficiency (chronic) (peripheral): Secondary | ICD-10-CM | POA: Insufficient documentation

## 2014-11-25 DIAGNOSIS — Z8489 Family history of other specified conditions: Secondary | ICD-10-CM | POA: Diagnosis not present

## 2014-11-25 DIAGNOSIS — Z9841 Cataract extraction status, right eye: Secondary | ICD-10-CM | POA: Insufficient documentation

## 2014-11-25 DIAGNOSIS — Z79899 Other long term (current) drug therapy: Secondary | ICD-10-CM | POA: Insufficient documentation

## 2014-11-25 DIAGNOSIS — Z8249 Family history of ischemic heart disease and other diseases of the circulatory system: Secondary | ICD-10-CM | POA: Diagnosis not present

## 2014-11-25 DIAGNOSIS — E7439 Other disorders of intestinal carbohydrate absorption: Secondary | ICD-10-CM | POA: Insufficient documentation

## 2014-11-25 DIAGNOSIS — Z87891 Personal history of nicotine dependence: Secondary | ICD-10-CM | POA: Insufficient documentation

## 2014-11-25 DIAGNOSIS — K253 Acute gastric ulcer without hemorrhage or perforation: Secondary | ICD-10-CM | POA: Insufficient documentation

## 2014-11-25 DIAGNOSIS — J449 Chronic obstructive pulmonary disease, unspecified: Secondary | ICD-10-CM | POA: Insufficient documentation

## 2014-11-25 DIAGNOSIS — K259 Gastric ulcer, unspecified as acute or chronic, without hemorrhage or perforation: Secondary | ICD-10-CM | POA: Diagnosis not present

## 2014-11-25 DIAGNOSIS — E039 Hypothyroidism, unspecified: Secondary | ICD-10-CM | POA: Insufficient documentation

## 2014-11-25 DIAGNOSIS — Z888 Allergy status to other drugs, medicaments and biological substances status: Secondary | ICD-10-CM | POA: Diagnosis not present

## 2014-11-25 DIAGNOSIS — I2510 Atherosclerotic heart disease of native coronary artery without angina pectoris: Secondary | ICD-10-CM | POA: Diagnosis not present

## 2014-11-25 HISTORY — DX: Presence of dental prosthetic device (complete) (partial): Z97.2

## 2014-11-25 HISTORY — DX: Pain in right shoulder: M25.511

## 2014-11-25 HISTORY — DX: Pain in right knee: M25.561

## 2014-11-25 HISTORY — DX: Hypothyroidism, unspecified: E03.9

## 2014-11-25 HISTORY — PX: ESOPHAGOGASTRODUODENOSCOPY (EGD) WITH PROPOFOL: SHX5813

## 2014-11-25 HISTORY — DX: Angina pectoris, unspecified: I20.9

## 2014-11-25 SURGERY — ESOPHAGOGASTRODUODENOSCOPY (EGD) WITH PROPOFOL
Anesthesia: Monitor Anesthesia Care | Wound class: Clean Contaminated

## 2014-11-25 MED ORDER — PANTOPRAZOLE SODIUM 40 MG PO TBEC: 40.0000 mg | DELAYED_RELEASE_TABLET | Freq: Every day | ORAL | Status: DC

## 2014-11-25 MED ORDER — PROPOFOL 10 MG/ML IV BOLUS
INTRAVENOUS | Status: DC | PRN
  Administered 2014-11-25: 100 mg via INTRAVENOUS

## 2014-11-25 MED ORDER — LACTATED RINGERS IV SOLN
INTRAVENOUS | Status: DC
  Administered 2014-11-25: 11:00:00 via INTRAVENOUS

## 2014-11-25 MED ORDER — STERILE WATER FOR IRRIGATION IR SOLN
Status: DC | PRN
  Administered 2014-11-25: 11:00:00

## 2014-11-25 MED ORDER — LIDOCAINE HCL (CARDIAC) 20 MG/ML IV SOLN
INTRAVENOUS | Status: DC | PRN
  Administered 2014-11-25: 40 mg via INTRAVENOUS

## 2014-11-25 SURGICAL SUPPLY — 39 items
BALLN DILATOR 10-12 8 (BALLOONS)
BALLN DILATOR 12-15 8 (BALLOONS)
BALLN DILATOR 15-18 8 (BALLOONS)
BALLN DILATOR CRE 0-12 8 (BALLOONS)
BALLN DILATOR ESOPH 8 10 CRE (MISCELLANEOUS) IMPLANT
BALLOON DILATOR 12-15 8 (BALLOONS) IMPLANT
BALLOON DILATOR 15-18 8 (BALLOONS) IMPLANT
BALLOON DILATOR CRE 0-12 8 (BALLOONS) IMPLANT
BLOCK BITE 60FR ADLT L/F GRN (MISCELLANEOUS) ×3 IMPLANT
CANISTER SUCT 1200ML W/VALVE (MISCELLANEOUS) ×3 IMPLANT
FCP ESCP3.2XJMB 240X2.8X (MISCELLANEOUS)
FORCEPS BIOP RAD 4 LRG CAP 4 (CUTTING FORCEPS) ×2 IMPLANT
FORCEPS BIOP RJ4 240 W/NDL (MISCELLANEOUS)
FORCEPS ESCP3.2XJMB 240X2.8X (MISCELLANEOUS) IMPLANT
GOWN CVR UNV OPN BCK APRN NK (MISCELLANEOUS) ×2 IMPLANT
GOWN ISOL THUMB LOOP REG UNIV (MISCELLANEOUS) ×6
HEMOCLIP INSTINCT (CLIP) IMPLANT
INJECTOR VARIJECT VIN23 (MISCELLANEOUS) IMPLANT
KIT CO2 TUBING (TUBING) IMPLANT
KIT DEFENDO VALVE AND CONN (KITS) IMPLANT
KIT ENDO PROCEDURE OLY (KITS) ×3 IMPLANT
LIGATOR MULTIBAND 6SHOOTER MBL (MISCELLANEOUS) IMPLANT
MARKER SPOT ENDO TATTOO 5ML (MISCELLANEOUS) IMPLANT
PAD GROUND ADULT SPLIT (MISCELLANEOUS) IMPLANT
SNARE SHORT THROW 13M SML OVAL (MISCELLANEOUS) IMPLANT
SNARE SHORT THROW 30M LRG OVAL (MISCELLANEOUS) IMPLANT
SPOT EX ENDOSCOPIC TATTOO (MISCELLANEOUS)
SUCTION POLY TRAP 4CHAMBER (MISCELLANEOUS) IMPLANT
SYR INFLATION 60ML (SYRINGE) IMPLANT
TRAP SUCTION POLY (MISCELLANEOUS) IMPLANT
TUBING CONN 6MMX3.1M (TUBING)
TUBING SUCTION CONN 0.25 STRL (TUBING) IMPLANT
UNDERPAD 30X60 958B10 (PK) (MISCELLANEOUS) IMPLANT
VALVE BIOPSY ENDO (VALVE) IMPLANT
VARIJECT INJECTOR VIN23 (MISCELLANEOUS)
WATER AUXILLARY (MISCELLANEOUS) IMPLANT
WATER STERILE IRR 250ML POUR (IV SOLUTION) ×3 IMPLANT
WATER STERILE IRR 500ML POUR (IV SOLUTION) IMPLANT
WIRE CRE 18-20MM 8CM F G (MISCELLANEOUS) IMPLANT

## 2014-11-25 NOTE — H&P (Signed)
[facility]  [address], [place], [place] [address] Phone: [phone] Fax : [phone]  Primary Care Physician:  Wilhemena Durie, MD Primary Gastroenterologist:  Dr. Allen Norris  Pre-Procedure History & Physical: HPI:  Jared Rice is a 81 y.o. male is here for an endoscopy.   Past Medical History  Diagnosis Date  . Precordial pain   . Unspecified sleep apnea   . Other dyspnea and respiratory abnormality     w/ pseuodowheeze resolves with purse lip manuever  . Chronic airway obstruction, not elsewhere classified   . Essential hypertension, benign   . Glucose intolerance (impaired glucose tolerance)   . Iron deficiency anemia   . Tremor   . Colon polyps   . Coronary atherosclerosis of native coronary artery     nonobstructive  . Anginal pain   . Hypothyroidism   . Occlusion and stenosis of carotid artery without mention of cerebral infarction     wears compression stockings  . Venous insufficiency   . Wears dentures     full upper  . Knee pain, right   . Shoulder pain, right     Past Surgical History  Procedure Laterality Date  . Total knee arthroplasty Left   . Colonoscopy  03/17/12    Dr Byrnett-diverticulosis  . Cardiac catheterization    . Eye surgery Bilateral     cataract     Prior to Admission medications   Medication Sig Start Date End Date Taking? Authorizing Provider  diphenhydramine-acetaminophen (TYLENOL PM) 25-500 MG TABS Take 2 tablets by mouth at bedtime as needed.   Yes Historical Provider, MD  doxazosin (CARDURA) 8 MG tablet Take 8 mg by mouth at bedtime.     Yes Historical Provider, MD  furosemide (LASIX) 20 MG tablet Take 20 mg by mouth daily. AM 11/30/13  Yes Historical Provider, MD  levothyroxine (SYNTHROID, LEVOTHROID) 75 MCG tablet Take 75 mcg by mouth daily. AM   Yes Historical Provider, MD  quinapril (ACCUPRIL) 40 MG tablet Take 40 mg by mouth at bedtime.   Yes Historical Provider, MD  rosuvastatin (CRESTOR) 20 MG  tablet Take 10 mg by mouth at bedtime.    Yes Historical Provider, MD    Allergies as of 11/11/2014 - Review Complete 11/11/2014  Allergen Reaction Noted  . Primidone Itching and Rash 09/21/2014    Family History  Problem Relation Age of Onset  . Heart failure Mother 33    congestive  . Heart attack Father 35  . Alcohol abuse Father   . Alzheimer's disease Brother 69  . Alzheimer's disease Sister 106  . Colon cancer Neg Hx   . Liver disease Neg Hx     History   Social History  . Marital Status: Married    Spouse Name: N/A  . Number of Children: 4  . Years of Education: N/A   Occupational History  . retired     [employer]   Social History Main Topics  . Smoking status: Former Smoker -- 2.00 packs/day for 31 years    Types: Cigarettes    Quit date: 12/24/1980  . Smokeless tobacco: Not on file     Comment: quit in 1982  . Alcohol Use: No     Comment: occasionally  . Drug Use: No  . Sexual Activity: Not on file   Other Topics Concern  . 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 Neck:  Supple; no masses or thyromegaly. Lungs:  Clear throughout to auscultation.    Heart:  Regular rate and rhythm. Abdomen:  Soft, nontender and nondistended. Normal bowel sounds, without guarding, and without rebound.   Neurologic:  Alert and  oriented x4;  grossly normal neurologically.  Impression/Plan: Jared Rice is here for an endoscopy to be performed for melena  Risks, benefits, limitations, and alternatives regarding  endoscopy have been reviewed with the patient.  Questions have  been answered.  All parties agreeable.   Ollen Bowl, MD  11/25/2014, 11:05 AM

## 2014-11-25 NOTE — Op Note (Signed)
[facility] Gastroenterology Patient Name: Jared Rice Procedure Date: 11/25/2014 11:13 AM MRN: [id number] Account #: [id number] Date of Birth: 1933-01-29 Admit Type: Outpatient Age: 81 Room: [facility] OR ROOM 01 Gender: Male Note Status: Finalized Procedure:         Upper GI endoscopy Indications:       Hematochezia Providers:         Lucilla Lame, MD Referring MD:      Janine Ores. Rosanna Randy, MD (Referring MD) Medicines:         Propofol per Anesthesia Complications:     No immediate complications. Procedure:         Pre-Anesthesia Assessment:                    - Prior to the procedure, a History and Physical was                     performed, and patient medications and allergies were                     reviewed. The patient's tolerance of previous anesthesia                     was also reviewed. The risks and benefits of the procedure                     and the sedation options and risks were discussed with the                     patient. All questions were answered, and informed consent                     was obtained. Prior Anticoagulants: The patient has taken                     no previous anticoagulant or antiplatelet agents. ASA                     Grade Assessment: II - A patient with mild systemic                     disease. After reviewing the risks and benefits, the                     patient was deemed in satisfactory condition to undergo                     the procedure.                    After obtaining informed consent, the endoscope was passed                     under direct vision. Throughout the procedure, the                     patient's blood pressure, pulse, and oxygen saturations                     were monitored continuously. The Olympus GIF-[id number]                     Endoscope (S#. [id number]) was introduced through the mouth,  and advanced to the second part of duodenum. The upper GI                     endoscopy  was accomplished without difficulty. The patient                     tolerated the procedure well. Findings:      The examined esophagus was normal.      One non-bleeding cratered gastric ulcer with no stigmata of bleeding was       found in the gastric antrum. Biopsies were taken with a cold forceps for       histology.      The examined duodenum was normal. Impression:        - Normal esophagus.                    - Gastric ulcer with clean base. Biopsied.                    - Normal examined duodenum. Recommendation:    - Await pathology results.                    - Use a proton pump inhibitor PO daily.                    - Repeat the upper endoscopy in 6 weeks to check healing. Procedure Code(s): --- Professional ---                    [phone], Esophagogastroduodenoscopy, flexible, transoral;                     with biopsy, single or multiple Diagnosis Code(s): --- Professional ---                    K92.1, Melena                    K25.9, Gastric ulcer, unspecified as acute or chronic,                     without hemorrhage or perforation CPT copyright 2014 American Medical Association. All rights reserved. The codes documented in this report are preliminary and upon coder review may  be revised to meet current compliance requirements. Lucilla Lame, MD 11/25/2014 11:20:52 AM This report has been signed electronically. Number of Addenda: 0 Note Initiated On: 11/25/2014 11:13 AM Total Procedure Duration: 0 hours 2 minutes 49 seconds       [facility]

## 2014-11-25 NOTE — Anesthesia Procedure Notes (Signed)
Procedure Name: MAC Performed by: Takeshi Teasdale Pre-anesthesia Checklist: Patient identified, Emergency Drugs available, Suction available, Timeout performed and Patient being monitored Patient Re-evaluated:Patient Re-evaluated prior to inductionOxygen Delivery Method: Nasal cannula Placement Confirmation: positive ETCO2     

## 2014-11-25 NOTE — Anesthesia Preprocedure Evaluation (Signed)
Anesthesia Evaluation  Patient identified by MRN, date of birth, ID band  Reviewed: Allergy & Precautions, H&P , NPO status , Patient's Chart, lab work & pertinent test results  Airway Mallampati: II  TM Distance: >3 FB Neck ROM: full    Dental no notable dental hx.    Pulmonary sleep apnea , COPDformer smoker,    Pulmonary exam normal       Cardiovascular hypertension, + angina + CAD Rhythm:regular Rate:Normal     Neuro/Psych    GI/Hepatic GERD-  ,  Endo/Other  diabetesHypothyroidism   Renal/GU      Musculoskeletal   Abdominal   Peds  Hematology   Anesthesia Other Findings   Reproductive/Obstetrics                             Anesthesia Physical Anesthesia Plan  ASA: III  Anesthesia Plan: MAC   Post-op Pain Management:    Induction:   Airway Management Planned:   Additional Equipment:   Intra-op Plan:   Post-operative Plan:   Informed Consent: I have reviewed the patients History and Physical, chart, labs and discussed the procedure including the risks, benefits and alternatives for the proposed anesthesia with the patient or authorized representative who has indicated his/her understanding and acceptance.     Plan Discussed with: CRNA  Anesthesia Plan Comments:         Anesthesia Quick Evaluation

## 2014-11-25 NOTE — Transfer of Care (Signed)
Immediate Anesthesia Transfer of Care Note  Patient: Jared Rice  Procedure(s) Performed: Procedure(s) with comments: ESOPHAGOGASTRODUODENOSCOPY (EGD) WITH PROPOFOL (N/A) - with biopsy  Patient Location: PACU  Anesthesia Type: MAC  Level of Consciousness: awake, alert  and patient cooperative  Airway and Oxygen Therapy: Patient Spontanous Breathing and Patient connected to supplemental oxygen  Post-op Assessment: Post-op Vital signs reviewed, Patient's Cardiovascular Status Stable, Respiratory Function Stable, Patent Airway and No signs of Nausea or vomiting  Post-op Vital Signs: Reviewed and stable  Complications: No apparent anesthesia complications

## 2014-11-25 NOTE — Anesthesia Postprocedure Evaluation (Signed)
  Anesthesia Post-op Note  Patient: Jared Rice A Bohnsack  Procedure(s) Performed: Procedure(s) with comments: ESOPHAGOGASTRODUODENOSCOPY (EGD) WITH PROPOFOL (N/A) - with biopsy  Anesthesia type:MAC  Patient location: PACU  Post pain: Pain level controlled  Post assessment: Post-op Vital signs reviewed, Patient's Cardiovascular Status Stable, Respiratory Function Stable, Patent Airway and No signs of Nausea or vomiting  Post vital signs: Reviewed and stable  Last Vitals:  Filed Vitals:   11/25/14 1145  BP: 156/64  Pulse: 50  Temp:   Resp: 15    Level of consciousness: awake, alert  and patient cooperative  Complications: No apparent anesthesia complications

## 2014-11-25 NOTE — Discharge Instructions (Signed)

## 2014-11-29 ENCOUNTER — Encounter: Payer: Self-pay | Admitting: Gastroenterology

## 2014-11-30 DIAGNOSIS — K253 Acute gastric ulcer without hemorrhage or perforation: Secondary | ICD-10-CM | POA: Insufficient documentation

## 2014-11-30 DIAGNOSIS — K921 Melena: Secondary | ICD-10-CM | POA: Insufficient documentation

## 2014-12-01 ENCOUNTER — Encounter: Payer: Self-pay | Admitting: Gastroenterology

## 2014-12-02 ENCOUNTER — Ambulatory Visit
Admission: RE | Admit: 2014-12-02 | Discharge: 2014-12-02 | Disposition: A | Discharge status: Home / Self Care | Payer: PPO | Source: Ambulatory Visit | Attending: Physician Assistant | Admitting: Physician Assistant

## 2014-12-02 DIAGNOSIS — M7541 Impingement syndrome of right shoulder: Secondary | ICD-10-CM

## 2014-12-02 DIAGNOSIS — M7521 Bicipital tendinitis, right shoulder: Secondary | ICD-10-CM | POA: Insufficient documentation

## 2014-12-02 DIAGNOSIS — M7551 Bursitis of right shoulder: Secondary | ICD-10-CM | POA: Diagnosis not present

## 2014-12-16 ENCOUNTER — Telehealth: Payer: Self-pay | Admitting: Family Medicine

## 2014-12-16 NOTE — Telephone Encounter (Signed)
[facility] [facility] says he needs a new Rx for furosemide (LASIX) 20 MG tablet levothyroxine (SYNTHROID, LEVOTHROID) 75 MCG tablet Their fax is [phone].  Thanks [employer]

## 2014-12-16 NOTE — Telephone Encounter (Signed)
Is this ok to do? I was not sure with the fluid pill. Thank you-aa

## 2014-12-18 NOTE — Telephone Encounter (Signed)
Ok to rf for 1 year--thanks 

## 2014-12-19 MED ORDER — LEVOTHYROXINE SODIUM 75 MCG PO TABS: 75.0000 ug | ORAL_TABLET | Freq: Every day | ORAL | Status: DC

## 2014-12-19 MED ORDER — FUROSEMIDE 20 MG PO TABS: 20.0000 mg | ORAL_TABLET | Freq: Every day | ORAL | Status: DC

## 2014-12-19 NOTE — Telephone Encounter (Signed)
Done-aa 

## 2014-12-26 ENCOUNTER — Telehealth: Payer: Self-pay

## 2014-12-26 NOTE — Telephone Encounter (Signed)
Patient called stating that his Pantoprazole is too expensive for him to purchase. He wanted to know if there was any other medication that he could try.  Patient also stated that he had an EGD three weeks ago and he just received a letter stating that he needed to repeat his EGD. Ginger, do you know anything about this?  Please give him a call as soon as you have a chance. Thank you.

## 2014-12-27 ENCOUNTER — Other Ambulatory Visit: Payer: Self-pay

## 2014-12-27 NOTE — Telephone Encounter (Signed)
Spoke with pt regarding the pantoprazole. He picked up the rx yesterday with no copay. He wonders if this was a mistake the last time. Pt will continue medication and let me know if there is a cost issue next month. Pt also scheduled for a repeat EGD per Dr. Allen Norris. Last EGD was on 11-25-14 and it was recommended by Dr. Allen Norris to repeat in 6 weeks due to gastric ulcer. Pt scheduled for 01-05-15.

## 2014-12-30 ENCOUNTER — Encounter: Payer: Self-pay | Admitting: *Deleted

## 2015-01-03 NOTE — Discharge Instructions (Signed)

## 2015-01-05 ENCOUNTER — Ambulatory Visit: Payer: PPO | Admitting: Anesthesiology

## 2015-01-05 ENCOUNTER — Other Ambulatory Visit: Payer: Self-pay | Admitting: Gastroenterology

## 2015-01-05 ENCOUNTER — Ambulatory Visit
Admission: RE | Admit: 2015-01-05 | Discharge: 2015-01-05 | Disposition: A | Discharge status: Home / Self Care | Payer: PPO | Source: Ambulatory Visit | Attending: Gastroenterology | Admitting: Gastroenterology

## 2015-01-05 ENCOUNTER — Encounter
Admission: RE | Disposition: A | Discharge status: Home / Self Care | Payer: Self-pay | Source: Ambulatory Visit | Attending: Gastroenterology

## 2015-01-05 DIAGNOSIS — Z811 Family history of alcohol abuse and dependence: Secondary | ICD-10-CM | POA: Diagnosis not present

## 2015-01-05 DIAGNOSIS — K2970 Gastritis, unspecified, without bleeding: Secondary | ICD-10-CM | POA: Diagnosis not present

## 2015-01-05 DIAGNOSIS — E039 Hypothyroidism, unspecified: Secondary | ICD-10-CM | POA: Insufficient documentation

## 2015-01-05 DIAGNOSIS — Z9842 Cataract extraction status, left eye: Secondary | ICD-10-CM | POA: Insufficient documentation

## 2015-01-05 DIAGNOSIS — M25511 Pain in right shoulder: Secondary | ICD-10-CM | POA: Diagnosis not present

## 2015-01-05 DIAGNOSIS — Z79899 Other long term (current) drug therapy: Secondary | ICD-10-CM | POA: Insufficient documentation

## 2015-01-05 DIAGNOSIS — I25119 Atherosclerotic heart disease of native coronary artery with unspecified angina pectoris: Secondary | ICD-10-CM | POA: Insufficient documentation

## 2015-01-05 DIAGNOSIS — Z8711 Personal history of peptic ulcer disease: Secondary | ICD-10-CM | POA: Diagnosis not present

## 2015-01-05 DIAGNOSIS — Z888 Allergy status to other drugs, medicaments and biological substances status: Secondary | ICD-10-CM | POA: Insufficient documentation

## 2015-01-05 DIAGNOSIS — I6529 Occlusion and stenosis of unspecified carotid artery: Secondary | ICD-10-CM | POA: Diagnosis not present

## 2015-01-05 DIAGNOSIS — M25561 Pain in right knee: Secondary | ICD-10-CM | POA: Diagnosis not present

## 2015-01-05 DIAGNOSIS — Z86010 Personal history of colonic polyps: Secondary | ICD-10-CM | POA: Insufficient documentation

## 2015-01-05 DIAGNOSIS — D509 Iron deficiency anemia, unspecified: Secondary | ICD-10-CM | POA: Insufficient documentation

## 2015-01-05 DIAGNOSIS — Z9841 Cataract extraction status, right eye: Secondary | ICD-10-CM | POA: Diagnosis not present

## 2015-01-05 DIAGNOSIS — I10 Essential (primary) hypertension: Secondary | ICD-10-CM | POA: Diagnosis not present

## 2015-01-05 DIAGNOSIS — Z87891 Personal history of nicotine dependence: Secondary | ICD-10-CM | POA: Insufficient documentation

## 2015-01-05 DIAGNOSIS — I872 Venous insufficiency (chronic) (peripheral): Secondary | ICD-10-CM | POA: Insufficient documentation

## 2015-01-05 DIAGNOSIS — K219 Gastro-esophageal reflux disease without esophagitis: Secondary | ICD-10-CM | POA: Diagnosis not present

## 2015-01-05 DIAGNOSIS — I2510 Atherosclerotic heart disease of native coronary artery without angina pectoris: Secondary | ICD-10-CM | POA: Diagnosis not present

## 2015-01-05 DIAGNOSIS — Z8249 Family history of ischemic heart disease and other diseases of the circulatory system: Secondary | ICD-10-CM | POA: Insufficient documentation

## 2015-01-05 DIAGNOSIS — Z8489 Family history of other specified conditions: Secondary | ICD-10-CM | POA: Insufficient documentation

## 2015-01-05 DIAGNOSIS — J449 Chronic obstructive pulmonary disease, unspecified: Secondary | ICD-10-CM | POA: Insufficient documentation

## 2015-01-05 DIAGNOSIS — R7302 Impaired glucose tolerance (oral): Secondary | ICD-10-CM | POA: Diagnosis not present

## 2015-01-05 DIAGNOSIS — R251 Tremor, unspecified: Secondary | ICD-10-CM | POA: Diagnosis not present

## 2015-01-05 DIAGNOSIS — R072 Precordial pain: Secondary | ICD-10-CM | POA: Insufficient documentation

## 2015-01-05 DIAGNOSIS — K449 Diaphragmatic hernia without obstruction or gangrene: Secondary | ICD-10-CM | POA: Diagnosis not present

## 2015-01-05 DIAGNOSIS — Z96652 Presence of left artificial knee joint: Secondary | ICD-10-CM | POA: Diagnosis not present

## 2015-01-05 DIAGNOSIS — G4730 Sleep apnea, unspecified: Secondary | ICD-10-CM | POA: Insufficient documentation

## 2015-01-05 DIAGNOSIS — K289 Gastrojejunal ulcer, unspecified as acute or chronic, without hemorrhage or perforation: Secondary | ICD-10-CM | POA: Diagnosis not present

## 2015-01-05 HISTORY — PX: ESOPHAGOGASTRODUODENOSCOPY (EGD) WITH PROPOFOL: SHX5813

## 2015-01-05 SURGERY — ESOPHAGOGASTRODUODENOSCOPY (EGD) WITH PROPOFOL
Anesthesia: Monitor Anesthesia Care | Wound class: Clean Contaminated

## 2015-01-05 MED ORDER — LACTATED RINGERS IV SOLN
INTRAVENOUS | Status: DC
  Administered 2015-01-05 (×2): via INTRAVENOUS

## 2015-01-05 MED ORDER — LACTATED RINGERS IV SOLN: INTRAVENOUS | Status: DC

## 2015-01-05 MED ORDER — PROPOFOL 10 MG/ML IV BOLUS
INTRAVENOUS | Status: DC | PRN
  Administered 2015-01-05 (×4): 20 mg via INTRAVENOUS

## 2015-01-05 MED ORDER — ACETAMINOPHEN 160 MG/5ML PO SOLN: 325.0000 mg | ORAL | Status: DC | PRN

## 2015-01-05 MED ORDER — ACETAMINOPHEN 325 MG PO TABS: 325.0000 mg | ORAL_TABLET | ORAL | Status: DC | PRN

## 2015-01-05 MED ORDER — ONDANSETRON HCL 4 MG/2ML IJ SOLN: 4.0000 mg | Freq: Once | INTRAMUSCULAR | Status: DC | PRN

## 2015-01-05 MED ORDER — LIDOCAINE HCL (CARDIAC) 20 MG/ML IV SOLN
INTRAVENOUS | Status: DC | PRN
  Administered 2015-01-05: 10 mg via INTRAVENOUS

## 2015-01-05 SURGICAL SUPPLY — 39 items
BALLN DILATOR 10-12 8 (BALLOONS)
BALLN DILATOR 12-15 8 (BALLOONS)
BALLN DILATOR 15-18 8 (BALLOONS)
BALLN DILATOR CRE 0-12 8 (BALLOONS)
BALLN DILATOR ESOPH 8 10 CRE (MISCELLANEOUS) IMPLANT
BALLOON DILATOR 12-15 8 (BALLOONS) IMPLANT
BALLOON DILATOR 15-18 8 (BALLOONS) IMPLANT
BALLOON DILATOR CRE 0-12 8 (BALLOONS) IMPLANT
BLOCK BITE 60FR ADLT L/F GRN (MISCELLANEOUS) ×3 IMPLANT
CANISTER SUCT 1200ML W/VALVE (MISCELLANEOUS) ×3 IMPLANT
FCP ESCP3.2XJMB 240X2.8X (MISCELLANEOUS)
FORCEPS BIOP RAD 4 LRG CAP 4 (CUTTING FORCEPS) ×2 IMPLANT
FORCEPS BIOP RJ4 240 W/NDL (MISCELLANEOUS)
FORCEPS ESCP3.2XJMB 240X2.8X (MISCELLANEOUS) IMPLANT
GOWN CVR UNV OPN BCK APRN NK (MISCELLANEOUS) ×2 IMPLANT
GOWN ISOL THUMB LOOP REG UNIV (MISCELLANEOUS) ×6
HEMOCLIP INSTINCT (CLIP) IMPLANT
INJECTOR VARIJECT VIN23 (MISCELLANEOUS) IMPLANT
KIT CO2 TUBING (TUBING) IMPLANT
KIT DEFENDO VALVE AND CONN (KITS) IMPLANT
KIT ENDO PROCEDURE OLY (KITS) ×3 IMPLANT
LIGATOR MULTIBAND 6SHOOTER MBL (MISCELLANEOUS) IMPLANT
MARKER SPOT ENDO TATTOO 5ML (MISCELLANEOUS) IMPLANT
PAD GROUND ADULT SPLIT (MISCELLANEOUS) IMPLANT
SNARE SHORT THROW 13M SML OVAL (MISCELLANEOUS) IMPLANT
SNARE SHORT THROW 30M LRG OVAL (MISCELLANEOUS) IMPLANT
SPOT EX ENDOSCOPIC TATTOO (MISCELLANEOUS)
SUCTION POLY TRAP 4CHAMBER (MISCELLANEOUS) IMPLANT
SYR INFLATION 60ML (SYRINGE) IMPLANT
TRAP SUCTION POLY (MISCELLANEOUS) IMPLANT
TUBING CONN 6MMX3.1M (TUBING)
TUBING SUCTION CONN 0.25 STRL (TUBING) IMPLANT
UNDERPAD 30X60 958B10 (PK) (MISCELLANEOUS) IMPLANT
VALVE BIOPSY ENDO (VALVE) IMPLANT
VARIJECT INJECTOR VIN23 (MISCELLANEOUS)
WATER AUXILLARY (MISCELLANEOUS) IMPLANT
WATER STERILE IRR 250ML POUR (IV SOLUTION) ×3 IMPLANT
WATER STERILE IRR 500ML POUR (IV SOLUTION) IMPLANT
WIRE CRE 18-20MM 8CM F G (MISCELLANEOUS) IMPLANT

## 2015-01-05 NOTE — Anesthesia Postprocedure Evaluation (Signed)
  Anesthesia Post-op Note  Patient: Jared Rice  Procedure(s) Performed: Procedure(s): ESOPHAGOGASTRODUODENOSCOPY (EGD) WITH PROPOFOL (N/A)  Anesthesia type:MAC  Patient location: PACU  Post pain: Pain level controlled  Post assessment: Post-op Vital signs reviewed, Patient's Cardiovascular Status Stable, Respiratory Function Stable, Patent Airway and No signs of Nausea or vomiting  Post vital signs: Reviewed and stable  Last Vitals:  Filed Vitals:   01/05/15 0942  BP: 137/78  Pulse: 52  Temp:   Resp: 17    Level of consciousness: awake, alert  and patient cooperative  Complications: No apparent anesthesia complications

## 2015-01-05 NOTE — Anesthesia Preprocedure Evaluation (Signed)
Anesthesia Evaluation  Patient identified by MRN, date of birth, ID band  Reviewed: Allergy & Precautions, H&P , NPO status , Patient's Chart, lab work & pertinent test results  Airway Mallampati: II  TM Distance: >3 FB Neck ROM: full    Dental no notable dental hx.    Pulmonary sleep apnea , COPDformer smoker,    Pulmonary exam normal       Cardiovascular hypertension, + angina + CAD Rhythm:regular Rate:Normal     Neuro/Psych    GI/Hepatic GERD-  ,  Endo/Other  diabetesHypothyroidism   Renal/GU      Musculoskeletal   Abdominal   Peds  Hematology   Anesthesia Other Findings   Reproductive/Obstetrics                             Anesthesia Physical  Anesthesia Plan  ASA: III  Anesthesia Plan: MAC   Post-op Pain Management:    Induction:   Airway Management Planned:   Additional Equipment:   Intra-op Plan:   Post-operative Plan:   Informed Consent: I have reviewed the patients History and Physical, chart, labs and discussed the procedure including the risks, benefits and alternatives for the proposed anesthesia with the patient or authorized representative who has indicated his/her understanding and acceptance.     Plan Discussed with: CRNA  Anesthesia Plan Comments:         Anesthesia Quick Evaluation

## 2015-01-05 NOTE — H&P (Signed)
[facility]  [address], [place], [place] [address] Phone: [phone] Fax : [phone]  Primary Care Physician:  Wilhemena Durie, MD Primary Gastroenterologist:  Dr. Allen Norris  Pre-Procedure History & Physical: HPI:  Jared Rice is a 81 y.o. male is here for an endoscopy.   Past Medical History  Diagnosis Date  . Precordial pain   . Unspecified sleep apnea   . Other dyspnea and respiratory abnormality     w/ pseuodowheeze resolves with purse lip manuever  . Chronic airway obstruction, not elsewhere classified   . Essential hypertension, benign   . Glucose intolerance (impaired glucose tolerance)   . Iron deficiency anemia   . Tremor   . Colon polyps   . Coronary atherosclerosis of native coronary artery     nonobstructive  . Anginal pain   . Hypothyroidism   . Occlusion and stenosis of carotid artery without mention of cerebral infarction     wears compression stockings  . Venous insufficiency   . Wears dentures     full upper  . Knee pain, right   . Shoulder pain, right     Past Surgical History  Procedure Laterality Date  . Total knee arthroplasty Left   . Colonoscopy  03/17/12    Dr Byrnett-diverticulosis  . Cardiac catheterization    . Eye surgery Bilateral     cataract   . Esophagogastroduodenoscopy (egd) with propofol N/A 11/25/2014    Procedure: ESOPHAGOGASTRODUODENOSCOPY (EGD) WITH PROPOFOL;  Surgeon: Lucilla Lame, MD;  Location: [facility];  Service: Endoscopy;  Laterality: N/A;  with biopsy    Prior to Admission medications   Medication Sig Start Date End Date Taking? Authorizing Provider  doxazosin (CARDURA) 8 MG tablet Take 8 mg by mouth at bedtime.     Yes Historical Provider, MD  furosemide (LASIX) 20 MG tablet Take 1 tablet (20 mg total) by mouth daily. AM 12/19/14  Yes Richard Maceo Pro., MD  levothyroxine (SYNTHROID, LEVOTHROID) 75 MCG tablet Take 1 tablet (75 mcg total) by mouth daily. AM 12/19/14  Yes Richard  Maceo Pro., MD  pantoprazole (PROTONIX) 40 MG tablet Take 1 tablet (40 mg total) by mouth daily before breakfast. 11/25/14  Yes Lucilla Lame, MD  quinapril (ACCUPRIL) 40 MG tablet Take 40 mg by mouth at bedtime.   Yes Historical Provider, MD  rosuvastatin (CRESTOR) 20 MG tablet Take 10 mg by mouth at bedtime.    Yes Historical Provider, MD  diphenhydramine-acetaminophen (TYLENOL PM) 25-500 MG TABS Take 2 tablets by mouth at bedtime as needed.    Historical Provider, MD    Allergies as of 12/27/2014 - Review Complete 11/25/2014  Allergen Reaction Noted  . Dexilant [dexlansoprazole] Rash 11/22/2014  . Primidone Itching and Rash 09/21/2014    Family History  Problem Relation Age of Onset  . Heart failure Mother 62    congestive  . Heart attack Father 43  . Alcohol abuse Father   . Alzheimer's disease Brother 50  . Alzheimer's disease Sister 62  . Colon cancer Neg Hx   . Liver disease Neg Hx     Social History   Social History  . Marital Status: Married    Spouse Name: N/A  . Number of Children: 4  . Years of Education: N/A   Occupational History  . retired     [employer]   Social History Main Topics  . Smoking status: Former Smoker -- 2.00 packs/day for 31 years  Types: Cigarettes    Quit date: 12/24/1980  . Smokeless tobacco: Not on file     Comment: quit in 1982  . Alcohol Use: No     Comment: occasionally  . Drug Use: No  . Sexual Activity: Not on file   Other Topics Concern  . Not on file   Social History Narrative   Lives with wife, retired Engineer, drilling, 2 children, 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    Review of Systems: See HPI, otherwise negative ROS  Physical Exam: BP 141/69 mmHg  Pulse 51  Temp(Src) 97.7 F (36.5 C) (Temporal)  Resp 16  Ht 6\' 2"  (1.88 m)  Wt 294 lb (133.358 kg)  BMI 37.73 kg/m2  SpO2 99% General:   Alert,  pleasant and cooperative in NAD Head:  Normocephalic and atraumatic. Neck:  Supple; no masses or  thyromegaly. Lungs:  Clear throughout to auscultation.    Heart:  Regular rate and rhythm. Abdomen:  Soft, nontender and nondistended. Normal bowel sounds, without guarding, and without rebound.   Neurologic:  Alert and  oriented x4;  grossly normal neurologically.  Impression/Plan: Jared Rice is here for an endoscopy to be performed for PUD  Risks, benefits, limitations, and alternatives regarding  endoscopy have been reviewed with the patient.  Questions have been answered.  All parties agreeable.   Ollen Bowl, MD  01/05/2015, 8:38 AM

## 2015-01-05 NOTE — Transfer of Care (Signed)
Immediate Anesthesia Transfer of Care Note  Patient: Jared Rice  Procedure(s) Performed: Procedure(s): ESOPHAGOGASTRODUODENOSCOPY (EGD) WITH PROPOFOL (N/A)  Patient Location: PACU  Anesthesia Type: MAC  Level of Consciousness: awake, alert  and patient cooperative  Airway and Oxygen Therapy: Patient Spontanous Breathing and Patient connected to supplemental oxygen  Post-op Assessment: Post-op Vital signs reviewed, Patient's Cardiovascular Status Stable, Respiratory Function Stable, Patent Airway and No signs of Nausea or vomiting  Post-op Vital Signs: Reviewed and stable  Complications: No apparent anesthesia complications

## 2015-01-05 NOTE — Op Note (Signed)
[facility]enter[facility] Patient Name: Jared Rice Procedure Date: 01/05/2015 9:11 AM MRN: [id number] Account #: [id number] Date of Birth: June 11, 1932 Admit Type: Outpatient Age: 81 Room: [facility] OR ROOM 01 Gender: Male Note Status: Finalized Procedure:         Upper GI endoscopy Indications:       Follow-up of ulcer of the GI tract, Personal history of                     peptic ulcer disease Providers:         Lucilla Lame, MD Referring MD:      Janine Ores. Rosanna Randy, MD (Referring MD) Medicines:         Propofol per Anesthesia Complications:     No immediate complications. Procedure:         Pre-Anesthesia Assessment:                    - Prior to the procedure, a History and Physical was                     performed, and patient medications and allergies were                     reviewed. The patient's tolerance of previous anesthesia                     was also reviewed. The risks and benefits of the procedure                     and the sedation options and risks were discussed with the                     patient. All questions were answered, and informed consent                     was obtained. Prior Anticoagulants: The patient has taken                     no previous anticoagulant or antiplatelet agents. ASA                     Grade Assessment: II - A patient with mild systemic                     disease. After reviewing the risks and benefits, the                     patient was deemed in satisfactory condition to undergo                     the procedure.                    After obtaining informed consent, the endoscope was passed                     under direct vision. Throughout the procedure, the                     patient's blood pressure, pulse, and oxygen saturations                     were monitored continuously. The was introduced through  the mouth, and advanced to the second part of duodenum.                     The  upper GI endoscopy was accomplished without                     difficulty. The patient tolerated the procedure well. Findings:      A small hiatus hernia was present.      Localized mild inflammation characterized by erythema was found in the       gastric antrum. Biopsies were taken with a cold forceps for histology.      The examined duodenum was normal. Impression:        - Small hiatus hernia.                    - Gastritis. Biopsied.                    - Normal examined duodenum. Recommendation:    - Await pathology results.                    - Continue present medications. Procedure Code(s): --- Professional ---                    [phone], Esophagogastroduodenoscopy, flexible, transoral;                     with biopsy, single or multiple Diagnosis Code(s): --- Professional ---                    K28.9, Gastrojejunal ulcer, unspecified as acute or                     chronic, without hemorrhage or perforation                    Z87.11, Personal history of peptic ulcer disease                    K29.70, Gastritis, unspecified, without bleeding                    K44.9, Diaphragmatic hernia without obstruction or gangrene CPT copyright 2014 American Medical Association. All rights reserved. The codes documented in this report are preliminary and upon coder review may  be revised to meet current compliance requirements. Lucilla Lame, MD 01/05/2015 9:28:25 AM This report has been signed electronically. Number of Addenda: 0 Note Initiated On: 01/05/2015 9:11 AM Total Procedure Duration: 0 hours 2 minutes 15 seconds       [facility]

## 2015-01-06 ENCOUNTER — Encounter: Payer: Self-pay | Admitting: Gastroenterology

## 2015-01-16 ENCOUNTER — Encounter: Payer: Self-pay | Admitting: Gastroenterology

## 2015-02-07 ENCOUNTER — Ambulatory Visit (INDEPENDENT_AMBULATORY_CARE_PROVIDER_SITE_OTHER): Payer: PPO | Admitting: Family Medicine

## 2015-02-07 ENCOUNTER — Encounter: Payer: Self-pay | Admitting: Emergency Medicine

## 2015-02-07 VITALS — BP 140/62 | HR 58 | Resp 16

## 2015-02-07 DIAGNOSIS — N410 Acute prostatitis: Secondary | ICD-10-CM | POA: Diagnosis not present

## 2015-02-07 DIAGNOSIS — N400 Enlarged prostate without lower urinary tract symptoms: Secondary | ICD-10-CM | POA: Diagnosis not present

## 2015-02-07 DIAGNOSIS — R350 Frequency of micturition: Secondary | ICD-10-CM

## 2015-02-07 LAB — POCT URINALYSIS DIPSTICK
Bilirubin, UA: NEGATIVE
Glucose, UA: NEGATIVE
Ketones, UA: NEGATIVE
Leukocytes, UA: NEGATIVE
Nitrite, UA: NEGATIVE
Protein, UA: NEGATIVE
SPEC GRAV UA: 1.01
Urobilinogen, UA: 0.2
pH, UA: 7

## 2015-02-07 LAB — GLUCOSE, POCT (MANUAL RESULT ENTRY): POC Glucose: 112 mg/dl — AB (ref 70–99)

## 2015-02-07 MED ORDER — TAMSULOSIN HCL 0.4 MG PO CAPS: 0.4000 mg | ORAL_CAPSULE | Freq: Every day | ORAL | Status: DC

## 2015-02-07 MED ORDER — CIPROFLOXACIN HCL 500 MG PO TABS: 500.0000 mg | ORAL_TABLET | Freq: Two times a day (BID) | ORAL | Status: DC

## 2015-02-07 NOTE — Progress Notes (Signed)
Patient ID: Jared Rice, male   DOB: 04-08-1933, 81 y.o.   MRN: [id number]       Patient: Jared Rice Male    DOB: 02/24/1933   82 y.o.   MRN: [id number] Visit Date: 02/07/2015  Today's Provider: Wilhemena Durie, MD   Chief Complaint  Patient presents with  . Urinary Frequency    x 2 days. Patient denies any burning or discomfort while urinating.    Subjective:    Urinary Frequency  This is a new problem. The current episode started in the past 7 days. The problem occurs every urination. The problem has been gradually worsening. The patient is experiencing no pain. There has been no fever. Associated symptoms include frequency. Pertinent negatives include no nausea or vomiting.  Patient reports that he has had to urinate more frequently throughout the day. Patient denies any pain or discomfort while urinating. He denies any hematuria. He does mention that his stool has been a little darker than usual. Denies any constipation or diarrhea. Patient has not been taking anything OTC for symptoms.      Allergies  Allergen Reactions  . Dexilant [Dexlansoprazole] Rash  . Primidone Itching and Rash    rash   Previous Medications   DIPHENHYDRAMINE-ACETAMINOPHEN (TYLENOL PM) 25-500 MG TABS    Take 2 tablets by mouth at bedtime as needed.   DOXAZOSIN (CARDURA) 8 MG TABLET    Take 8 mg by mouth at bedtime.     FUROSEMIDE (LASIX) 20 MG TABLET    Take 1 tablet (20 mg total) by mouth daily. AM   LEVOTHYROXINE (SYNTHROID, LEVOTHROID) 75 MCG TABLET    Take 1 tablet (75 mcg total) by mouth daily. AM   PANTOPRAZOLE (PROTONIX) 40 MG TABLET    Take 1 tablet (40 mg total) by mouth daily before breakfast.   QUINAPRIL (ACCUPRIL) 40 MG TABLET    Take 40 mg by mouth at bedtime.   ROSUVASTATIN (CRESTOR) 20 MG TABLET    Take 10 mg by mouth at bedtime.    TOPIRAMATE (TOPAMAX) 50 MG TABLET    Take 1 tablet by mouth 2 (two) times daily.    Review of Systems  Constitutional: Positive for fatigue.    Eyes: Negative.   Gastrointestinal: Negative for nausea, vomiting, abdominal pain, diarrhea, constipation, anal bleeding and rectal pain.  Endocrine: Negative.   Genitourinary: Positive for frequency.  Allergic/Immunologic: Negative.   Neurological: Negative.   Hematological: Negative.   Psychiatric/Behavioral: Negative.     Social History  Substance Use Topics  . Smoking status: Former Smoker -- 2.00 packs/day for 31 years    Types: Cigarettes    Quit date: 12/24/1980  . Smokeless tobacco: Not on file     Comment: quit in 1982  . Alcohol Use: No     Comment: occasionally   Objective:   There were no vitals taken for this visit.  Physical Exam  Constitutional: He is oriented to person, place, and time. He appears well-developed and well-nourished.  HENT:  Head: Normocephalic and atraumatic.  Right Ear: External ear normal.  Left Ear: External ear normal.  Nose: Nose normal.  Eyes: Conjunctivae are normal.  Neck: Neck supple.  Cardiovascular: Normal rate, regular rhythm and normal heart sounds.   Pulmonary/Chest: Effort normal and breath sounds normal.  Abdominal: Soft.  Neurological: He is alert and oriented to person, place, and time.  Skin: Skin is warm and dry.  Psychiatric: He has a normal mood and affect. His behavior is  normal. Judgment and thought content normal.        Assessment & Plan:     1. Urinary frequency  - POCT Urinalysis Dipstick - POCT Glucose (CBG)  2. Acute prostatitis Treat with Cipro for 10 days.  3. BPH (benign prostatic hypertrophy) Try tamsulosin 0.4 mg daily. Return to clinic 2-3 weeks.       Larinda Herter Cranford Mon, MD  [facility]

## 2015-02-08 ENCOUNTER — Other Ambulatory Visit: Payer: Self-pay

## 2015-02-08 ENCOUNTER — Telehealth: Payer: Self-pay | Admitting: Family Medicine

## 2015-02-08 MED ORDER — TAMSULOSIN HCL 0.4 MG PO CAPS: 0.4000 mg | ORAL_CAPSULE | Freq: Every day | ORAL | Status: DC

## 2015-02-08 MED ORDER — CIPROFLOXACIN HCL 500 MG PO TABS: 500.0000 mg | ORAL_TABLET | Freq: Two times a day (BID) | ORAL | Status: DC

## 2015-02-08 NOTE — Telephone Encounter (Signed)
Spoke with mail order pharmacy and let them know not to fill these medications and recent the refills to local pharmacy as per patient request-aa

## 2015-02-08 NOTE — Telephone Encounter (Signed)
See other message. This has been taking care of this thanks-aa

## 2015-02-08 NOTE — Telephone Encounter (Signed)
Pt stated that both medications tamsulosin (FLOMAX) 0.4 MG CAPS capsule & ciprofloxacin (CIPRO) 500 MG tablet were supposed to be sent to [place]. Pt would like [place] to contact mail order and stop them from sending the medications to him. Pt stated that he thinks he will be on Flomax for a short time and doesn't wish to get it from mail order at this time. Pt also wanted to make sure that both medications are sent in for the generic. Thanks TNP

## 2015-02-18 ENCOUNTER — Ambulatory Visit (INDEPENDENT_AMBULATORY_CARE_PROVIDER_SITE_OTHER): Payer: PPO

## 2015-02-18 DIAGNOSIS — Z23 Encounter for immunization: Secondary | ICD-10-CM

## 2015-02-22 ENCOUNTER — Encounter: Payer: Self-pay | Admitting: Family Medicine

## 2015-02-22 ENCOUNTER — Ambulatory Visit (INDEPENDENT_AMBULATORY_CARE_PROVIDER_SITE_OTHER): Payer: PPO | Admitting: Family Medicine

## 2015-02-22 VITALS — BP 108/60 | HR 60 | Temp 98.0°F | Resp 16 | Wt 274.8 lb

## 2015-02-22 DIAGNOSIS — N400 Enlarged prostate without lower urinary tract symptoms: Secondary | ICD-10-CM

## 2015-02-22 DIAGNOSIS — I499 Cardiac arrhythmia, unspecified: Secondary | ICD-10-CM

## 2015-02-22 NOTE — Progress Notes (Signed)
Patient: Jared Rice Male    DOB: [birth year]   81 y.o.   MRN: [id number] Visit Date: 02/22/2015  Today's Provider: Wilhemena Durie, MD   Chief Complaint  Patient presents with  . Benign Prostatic Hypertrophy    Follow up   Subjective:    Benign Prostatic Hypertrophy The current episode started 1 to 4 weeks ago. The problem has been resolved since onset. Irritative symptoms do not include frequency or urgency. Per patient is doing better and not going to the bathroom frequently. Gets up twice during the night the most. Obstructive symptoms do not include incomplete emptying. Pertinent negatives include no chills, genital pain or hematuria. Nothing aggravates the symptoms. Past treatments include tamsulosin (Per patient only took for 3 to 4 days). The treatment provided significant relief.       Allergies  Allergen Reactions  . Dexilant [Dexlansoprazole] Rash  . Primidone Itching and Rash    rash   Previous Medications   ASPIRIN 81 MG TABLET    Take 81 mg by mouth daily.   CIPROFLOXACIN (CIPRO) 500 MG TABLET    Take 1 tablet (500 mg total) by mouth 2 (two) times daily.   DIPHENHYDRAMINE-ACETAMINOPHEN (TYLENOL PM) 25-500 MG TABS    Take 2 tablets by mouth at bedtime as needed.   DOXAZOSIN (CARDURA) 8 MG TABLET    Take 8 mg by mouth at bedtime.     FUROSEMIDE (LASIX) 20 MG TABLET    Take 1 tablet (20 mg total) by mouth daily. AM   LEVOTHYROXINE (SYNTHROID, LEVOTHROID) 75 MCG TABLET    Take 1 tablet (75 mcg total) by mouth daily. AM   PANTOPRAZOLE (PROTONIX) 40 MG TABLET    Take 1 tablet (40 mg total) by mouth daily before breakfast.   QUINAPRIL (ACCUPRIL) 40 MG TABLET    Take 40 mg by mouth at bedtime.   ROSUVASTATIN (CRESTOR) 20 MG TABLET    Take 10 mg by mouth at bedtime.    TAMSULOSIN (FLOMAX) 0.4 MG CAPS CAPSULE    Take 1 capsule (0.4 mg total) by mouth daily.   TOPIRAMATE (TOPAMAX) 50 MG TABLET    Take 1 tablet by mouth 2 (two) times daily.    Review of Systems    Constitutional: Negative.  Negative for chills.  HENT: Negative.   Eyes: Negative.   Respiratory: Negative.   Cardiovascular: Negative.   Gastrointestinal: Negative.   Endocrine: Negative.   Genitourinary: Negative.  Negative for urgency, frequency, hematuria, difficulty urinating, testicular pain and incomplete emptying.  Musculoskeletal: Negative.   Skin: Negative.   Allergic/Immunologic: Negative.   Neurological: Negative.   Hematological: Negative.   Psychiatric/Behavioral: Negative.     Social History  Substance Use Topics  . Smoking status: Former Smoker -- 2.00 packs/day for 31 years    Types: Cigarettes    Quit date: 12/24/1980  . Smokeless tobacco: Not on file     Comment: quit in 1982  . Alcohol Use: No     Comment: occasionally   Objective:   BP 108/60 mmHg  Pulse 60  Temp(Src) 98 F (36.7 C) (Oral)  Resp 16  Wt 274 lb 12.8 oz (124.648 kg)  Physical Exam  Constitutional: He is oriented to person, place, and time. He appears well-developed and well-nourished.  HENT:  Head: Normocephalic and atraumatic.  Right Ear: External ear normal.  Left Ear: External ear normal.  Nose: Nose normal.  Eyes: Conjunctivae are normal.  Neck: Neck supple.  Cardiovascular: Normal rate, regular rhythm and normal heart sounds.   Pulmonary/Chest: Effort normal and breath sounds normal.  Abdominal: Soft.  Neurological: He is alert and oriented to person, place, and time.  Skin: Skin is warm and dry.  Psychiatric: He has a normal mood and affect. His behavior is normal. Judgment and thought content normal.        Assessment & Plan:     1. BPH (benign prostatic hypertrophy) Patient off Flomax which is appropriate.  2. Irregular heart beat Normal cardiogram and exam.  - EKG 12-Lead 3 prostatitis Patient markedly improved      Wilhemena Durie, MD  [facility]

## 2015-02-23 ENCOUNTER — Ambulatory Visit: Payer: PPO | Admitting: Family Medicine

## 2015-02-27 ENCOUNTER — Other Ambulatory Visit: Payer: Self-pay

## 2015-02-27 ENCOUNTER — Telehealth: Payer: Self-pay | Admitting: Gastroenterology

## 2015-02-27 DIAGNOSIS — K253 Acute gastric ulcer without hemorrhage or perforation: Principal | ICD-10-CM

## 2015-02-27 DIAGNOSIS — B9681 Helicobacter pylori [H. pylori] as the cause of diseases classified elsewhere: Secondary | ICD-10-CM

## 2015-02-27 MED ORDER — PANTOPRAZOLE SODIUM 40 MG PO TBEC: 40.0000 mg | DELAYED_RELEASE_TABLET | Freq: Every day | ORAL | Status: DC

## 2015-02-27 NOTE — Telephone Encounter (Signed)
Patient called and said he is almost out of pantoprazole, he feels it is doing great. He does not have any refills. Does he need to stay on this medication? Please call patient and pharmacy is [facility] on [address]

## 2015-02-27 NOTE — Telephone Encounter (Signed)
Returned pt's call regarding refill. Advised pt we will refill until the end of this year and after that he will be okay to discontinue Pantoprazole if he is feeling okay. Pt was fine with this and another rx sent to [place], [place]

## 2015-03-16 ENCOUNTER — Telehealth: Payer: Self-pay | Admitting: Family Medicine

## 2015-03-16 NOTE — Telephone Encounter (Signed)
Pt stated that his insurance will no longer cover CRESTOR and wanted to make sure it would be ok for him to take the generic. Please advise. Thanks TNP

## 2015-03-16 NOTE — Telephone Encounter (Signed)
Generic is fine if he can get it.

## 2015-03-16 NOTE — Telephone Encounter (Signed)
See below please-aa 

## 2015-03-17 NOTE — Telephone Encounter (Signed)
Pt advised, pt does not need RX right now he has plenty.-aa

## 2015-04-26 ENCOUNTER — Encounter: Payer: Self-pay | Admitting: Family Medicine

## 2015-05-10 ENCOUNTER — Ambulatory Visit (INDEPENDENT_AMBULATORY_CARE_PROVIDER_SITE_OTHER): Payer: PPO | Admitting: Family Medicine

## 2015-05-10 ENCOUNTER — Encounter: Payer: Self-pay | Admitting: Family Medicine

## 2015-05-10 VITALS — BP 108/62 | HR 52 | Temp 97.8°F | Resp 16 | Ht 74.0 in | Wt 275.0 lb

## 2015-05-10 DIAGNOSIS — Z0000 Encounter for general adult medical examination without abnormal findings: Secondary | ICD-10-CM

## 2015-05-10 NOTE — Progress Notes (Signed)
Patient ID: HURON DEPAULIS, male   DOB: 1933/05/01, 81 y.o.   MRN: [id number]       Patient: Jared Rice, Male    DOB: November 27, 1932, 81 y.o.   MRN: [id number] Visit Date: 05/10/2015  Today's Provider: Wilhemena Durie, MD   Chief Complaint  Patient presents with  . Annual Exam   Subjective:    Annual wellness visit Jared Rice is a 81 y.o. male. He feels well. He reports exercising none. He reports he is sleeping well. He sleeps on average 7 hours a night.  Last: Colonoscopy- 03/07/2012. Diverticulosis.  Endoscopy- 01/05/2015. Hiatus hernia.  Pneumo23- 03/25/1997  Prevnar- 04/14/2014   Tdap- 02/20/2011   Zoster- 05/12/2007   -----------------------------------------------------------   Review of Systems  Constitutional: Negative.   HENT: Negative.   Eyes: Negative.   Respiratory: Negative.   Cardiovascular: Negative.   Gastrointestinal: Negative.   Endocrine: Negative.   Genitourinary: Negative.   Musculoskeletal: Negative.   Neurological: Negative.   Hematological: Negative.   Psychiatric/Behavioral: Negative.     Social History   Social History  . Marital Status: Married    Spouse Name: N/A  . Number of Children: 4  . Years of Education: N/A   Occupational History  . retired     [employer]   Social History Main Topics  . Smoking status: Former Smoker -- 2.00 packs/day for 31 years    Types: Cigarettes    Quit date: 12/24/1980  . Smokeless tobacco: Not on file     Comment: quit in 1982  . Alcohol Use: No     Comment: occasionally  . Drug Use: No  . Sexual Activity: Not on file   Other Topics Concern  . 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 BPH (benign prostatic hypertrophy) 02/22/2015  . Gastrointestinal ulcer   . History of peptic ulcer   . Gastritis   . Hematochezia   .  Acute gastric ulcer   . Melena 11/12/2014  . Malignant neoplasm of skin 09/21/2014  . Cataract 09/21/2014  . Colon polyp 09/21/2014  . CAFL (chronic airflow limitation) ([place]) 09/21/2014  . Diabetes ([place]) 09/21/2014  . DD (diverticular disease) 09/21/2014  . Benign essential tremor 09/21/2014  . Personal history of tobacco use, presenting hazards to health 09/21/2014  . Acid reflux 09/21/2014  . Hemorrhoids 09/21/2014  . 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 HLD (hyperlipidemia) 09/21/2014  . BP (high blood pressure) 09/21/2014  . Adult hypothyroidism 09/21/2014  . Lymphoma ([place]) 09/21/2014  . Malaise and fatigue 09/21/2014  . Mononeuritis 09/21/2014  . Muscle ache 09/21/2014  . Neuropathy ([place]) 09/21/2014  . Numbness and tingling 09/21/2014  . Arthritis, degenerative 09/21/2014  . Adiposity 09/21/2014  . Awareness of heartbeats 09/21/2014  . Borderline diabetes 09/21/2014  . Acne erythematosa 09/21/2014  . Rotator cuff syndrome 09/21/2014  . Athlete's foot 09/21/2014  . Has a tremor 09/21/2014  . Stasis, venous 09/21/2014  . Avitaminosis D 09/21/2014  . Arthritis of knee, degenerative 11/16/2013  . Bradycardia, sinus 12/31/2011  . Left bundle branch block 12/31/2011  . First degree AV block 12/31/2011  . Edema of both legs 12/31/2011  . COPD UNSPECIFIED 11/09/2009  . HYPERTENSION, BENIGN 10/03/2009  . SLEEP APNEA 10/03/2009  . DYSPNEA 10/03/2009  . CAD, NATIVE VESSEL 05/04/2009  . Carotid disease, bilateral ([place]) 05/04/2009  Past Surgical History  Procedure Laterality Date  . Total knee arthroplasty Left   . Colonoscopy  03/17/12    Dr Byrnett-diverticulosis  . Cardiac catheterization    . Eye surgery Bilateral     cataract   . Esophagogastroduodenoscopy (egd) with propofol N/A 11/25/2014    Procedure: ESOPHAGOGASTRODUODENOSCOPY (EGD) WITH PROPOFOL;  Surgeon: Lucilla Lame, MD;  Location: [facility];  Service: Endoscopy;  Laterality: N/A;  with biopsy  .  Esophagogastroduodenoscopy (egd) with propofol N/A 01/05/2015    Procedure: ESOPHAGOGASTRODUODENOSCOPY (EGD) WITH PROPOFOL;  Surgeon: Lucilla Lame, MD;  Location: [facility];  Service: Endoscopy;  Laterality: N/A;    His family history includes Alcohol abuse in his father; Alzheimer's disease (age of onset: 31) in his brother; Alzheimer's disease (age of onset: 26) in his sister; Heart attack (age of onset: 70) in his father; Heart failure (age of onset: 60) in his mother. There is no history of Colon cancer or Liver disease.    Previous Medications   ASPIRIN 81 MG TABLET    Take 81 mg by mouth daily.   CIPROFLOXACIN (CIPRO) 500 MG TABLET    Take 1 tablet (500 mg total) by mouth 2 (two) times daily.   DIPHENHYDRAMINE-ACETAMINOPHEN (TYLENOL PM) 25-500 MG TABS    Take 2 tablets by mouth at bedtime as needed.   DOXAZOSIN (CARDURA) 8 MG TABLET    Take 8 mg by mouth at bedtime.     FUROSEMIDE (LASIX) 20 MG TABLET    Take 1 tablet (20 mg total) by mouth daily. AM   LEVOTHYROXINE (SYNTHROID, LEVOTHROID) 75 MCG TABLET    Take 1 tablet (75 mcg total) by mouth daily. AM   PANTOPRAZOLE (PROTONIX) 40 MG TABLET    Take 1 tablet (40 mg total) by mouth daily before breakfast.   QUINAPRIL (ACCUPRIL) 40 MG TABLET    Take 40 mg by mouth at bedtime.   ROSUVASTATIN (CRESTOR) 20 MG TABLET    Take 10 mg by mouth at bedtime. Reported on 05/10/2015   TAMSULOSIN (FLOMAX) 0.4 MG CAPS CAPSULE    Take 1 capsule (0.4 mg total) by mouth daily.   TOPIRAMATE (TOPAMAX) 50 MG TABLET    Take 1 tablet by mouth 2 (two) times daily.    Patient Care Team: Jerrol Banana., MD as PCP - General (Unknown Physician Specialty)     Objective:   Vitals: BP 108/62 mmHg  Pulse 52  Temp(Src) 97.8 F (36.6 C)  Resp 16  Ht 6\' 2"  (1.88 m)  Wt 275 lb (124.739 kg)  BMI 35.29 kg/m2  Physical Exam  Constitutional: He is oriented to person, place, and time. He appears well-developed and well-nourished.  HENT:  Head:  Normocephalic and atraumatic.  Right Ear: External ear normal.  Left Ear: External ear normal.  Nose: Nose normal.  Mouth/Throat: Oropharynx is clear and moist.  Eyes: Conjunctivae are normal.  Neck: Neck supple.  Cardiovascular: Normal rate, regular rhythm and normal heart sounds.   Pulmonary/Chest: Effort normal and breath sounds normal.  Abdominal: Soft.  Musculoskeletal: He exhibits edema.  1+ edema.  Neurological: He is alert and oriented to person, place, and time.  Skin: Skin is warm and dry.  Psychiatric: His behavior is normal. Judgment and thought content normal.    Activities of Daily Living In your present state of health, do you have any difficulty performing the following activities: 05/10/2015 01/05/2015  Hearing? N N  Vision? N N  Difficulty concentrating or making decisions? N  N  Walking or climbing stairs? Y N  Dressing or bathing? N N  Doing errands, shopping? N -    Fall Risk Assessment Fall Risk  05/10/2015  Falls in the past year? No     Depression Screen PHQ 2/9 Scores 05/10/2015  PHQ - 2 Score 0    Cognitive Testing - 6-CIT  Correct? Score   What year is it? yes 0 0 or 4  What month is it? yes 0 0 or 3  Memorize:    Pia Mau,  44,  [address],  [place],      What time is it? (within 1 hour) yes 0 0 or 3  Count backwards from 20 yes 0 0, 2, or 4  Name the months of the year yes 0 0, 2, or 4  Repeat name & address above yes 0 0, 2, 4, 6, 8, or 10       TOTAL SCORE  0/28   Interpretation:  Normal  Normal (0-7) Abnormal (8-28)       Assessment & Plan:     Annual Wellness Visit  Reviewed patient's Family Medical History Reviewed and updated list of patient's medical providers Assessment of cognitive impairment was done Assessed patient's functional ability Established a written schedule for health screening [place] Completed and Reviewed  Exercise Activities and Dietary recommendations Goals    None       Immunization History  Administered Date(s) Administered  . Influenza, High Dose Seasonal PF 02/18/2015  . Influenza-Unspecified 02/24/2013  . Pneumococcal Conjugate-13 04/14/2014  . Pneumococcal Polysaccharide-23 03/25/1997  . Td 08/29/2003  . Tdap 02/20/2011  . Zoster 05/12/2007    Health Maintenance  Topic Date Due  . FOOT EXAM  11/06/1944  . OPHTHALMOLOGY EXAM  11/06/1944  . URINE MICROALBUMIN  11/06/1944  . PNA vac Low Risk Adult (2 of 2 - PPSV23) 04/15/2015  . HEMOGLOBIN A1C  05/03/2015  . INFLUENZA VACCINE  12/26/2015  . TETANUS/TDAP  02/19/2021  . ZOSTAVAX  Completed      Discussed health benefits of physical activity, and encouraged him to engage in regular exercise appropriate for his age and condition.    ------------------------------------------------------------------------------------------------------------

## 2015-06-06 ENCOUNTER — Encounter: Payer: Self-pay | Admitting: Cardiology

## 2015-06-06 ENCOUNTER — Encounter: Payer: Self-pay | Admitting: Cardiovascular Disease

## 2015-06-12 ENCOUNTER — Other Ambulatory Visit: Payer: Self-pay | Admitting: Gastroenterology

## 2015-06-12 DIAGNOSIS — K253 Acute gastric ulcer without hemorrhage or perforation: Principal | ICD-10-CM

## 2015-06-12 DIAGNOSIS — B9681 Helicobacter pylori [H. pylori] as the cause of diseases classified elsewhere: Secondary | ICD-10-CM

## 2015-06-12 MED ORDER — PANTOPRAZOLE SODIUM 40 MG PO TBEC: 40.0000 mg | DELAYED_RELEASE_TABLET | Freq: Every day | ORAL | Status: DC

## 2015-06-12 NOTE — Telephone Encounter (Signed)
Please call patient, he needs a refill on his Pantoprazole - Pharmacy is the [facility] on [address]. Thanks

## 2015-06-12 NOTE — Telephone Encounter (Signed)
Medication sent to pharmacy at this time. 

## 2015-06-22 ENCOUNTER — Encounter: Payer: Self-pay | Admitting: Family Medicine

## 2015-06-22 ENCOUNTER — Ambulatory Visit (INDEPENDENT_AMBULATORY_CARE_PROVIDER_SITE_OTHER): Payer: PPO | Admitting: Family Medicine

## 2015-06-22 VITALS — BP 100/58 | HR 74 | Temp 98.5°F | Resp 20 | Wt 271.0 lb

## 2015-06-22 DIAGNOSIS — J40 Bronchitis, not specified as acute or chronic: Secondary | ICD-10-CM

## 2015-06-22 MED ORDER — AMOXICILLIN-POT CLAVULANATE 875-125 MG PO TABS: 1.0000 | ORAL_TABLET | Freq: Two times a day (BID) | ORAL | Status: DC

## 2015-06-22 NOTE — Patient Instructions (Signed)
Start Robitussin DM for cough.

## 2015-06-22 NOTE — Progress Notes (Signed)
Subjective:    Patient ID: Jared Rice, male    DOB: April 25, 1933, 82 y.o.   MRN: [id number]  URI  This is a new problem. The current episode started in the past 7 days (x 3 days). The problem has been gradually worsening. There has been no fever. Associated symptoms include congestion, coughing (productive with green sputum), ear pain, rhinorrhea, sneezing and a sore throat. Pertinent negatives include no abdominal pain, chest pain, diarrhea, dysuria, headaches, nausea, plugged ear sensation, sinus pain, swollen glands, vomiting or wheezing. Treatments tried: gargling with warm salt water, Vitamin C. The treatment provided mild relief.      Review of Systems  HENT: Positive for congestion, ear pain, rhinorrhea, sneezing and sore throat.   Respiratory: Positive for cough (productive with green sputum). Negative for wheezing.   Cardiovascular: Negative for chest pain.  Gastrointestinal: Negative for nausea, vomiting, abdominal pain and diarrhea.  Endocrine: Negative.   Genitourinary: Negative for dysuria.  Allergic/Immunologic: Negative.   Neurological: Negative for headaches.  Hematological: Negative.   Psychiatric/Behavioral: Negative.    BP 100/58 mmHg  Pulse 74  Temp(Src) 98.5 F (36.9 C) (Oral)  Resp 20  Wt 271 lb (122.925 kg)  SpO2 94%   Patient Active Problem List   Diagnosis Date Noted  . BPH (benign prostatic hypertrophy) 02/22/2015  . Gastrointestinal ulcer   . History of peptic ulcer   . Gastritis   . Hematochezia   . Acute gastric ulcer   . Melena 11/12/2014  . Malignant neoplasm of skin 09/21/2014  . Cataract 09/21/2014  . Colon polyp 09/21/2014  . CAFL (chronic airflow limitation) ([place]) 09/21/2014  . Diabetes ([place]) 09/21/2014  . DD (diverticular disease) 09/21/2014  . Benign essential tremor 09/21/2014  . Personal history of tobacco use, presenting hazards to health 09/21/2014  . Acid reflux 09/21/2014  . Hemorrhoids 09/21/2014  . Bergmann's syndrome  09/21/2014  . HLD (hyperlipidemia) 09/21/2014  . BP (high blood pressure) 09/21/2014  . Adult hypothyroidism 09/21/2014  . Lymphoma ([place]) 09/21/2014  . Malaise and fatigue 09/21/2014  . Mononeuritis 09/21/2014  . Muscle ache 09/21/2014  . Neuropathy ([place]) 09/21/2014  . Numbness and tingling 09/21/2014  . Arthritis, degenerative 09/21/2014  . Adiposity 09/21/2014  . Awareness of heartbeats 09/21/2014  . Borderline diabetes 09/21/2014  . Acne erythematosa 09/21/2014  . Rotator cuff syndrome 09/21/2014  . Athlete's foot 09/21/2014  . Has a tremor 09/21/2014  . Stasis, venous 09/21/2014  . Avitaminosis D 09/21/2014  . Arthritis of knee, degenerative 11/16/2013  . Bradycardia, sinus 12/31/2011  . Left bundle branch block 12/31/2011  . First degree AV block 12/31/2011  . Edema of both legs 12/31/2011  . COPD UNSPECIFIED 11/09/2009  . HYPERTENSION, BENIGN 10/03/2009  . SLEEP APNEA 10/03/2009  . DYSPNEA 10/03/2009  . CAD, NATIVE VESSEL 05/04/2009  . Carotid disease, bilateral ([place]) 05/04/2009   Past Medical History  Diagnosis Date  . Precordial pain   . Unspecified sleep apnea   . Other dyspnea and respiratory abnormality     w/ pseuodowheeze resolves with purse lip manuever  . Chronic airway obstruction, not elsewhere classified   . Essential hypertension, benign   . Glucose intolerance (impaired glucose tolerance)   . Iron deficiency anemia   . Tremor   . Colon polyps   . Coronary atherosclerosis of native coronary artery     nonobstructive  . Anginal pain ([place])   . Hypothyroidism   . Occlusion and stenosis of carotid artery without mention of cerebral  infarction     wears compression stockings  . Venous insufficiency   . Wears dentures     full upper  . Knee pain, right   . Shoulder pain, right    Current Outpatient Prescriptions on File Prior to Visit  Medication Sig  . aspirin 81 MG tablet Take 81 mg by mouth daily.  . diphenhydramine-acetaminophen (TYLENOL PM)  25-500 MG TABS Take 2 tablets by mouth at bedtime as needed.  . doxazosin (CARDURA) 8 MG tablet Take 8 mg by mouth at bedtime.    . furosemide (LASIX) 20 MG tablet Take 1 tablet (20 mg total) by mouth daily. AM  . levothyroxine (SYNTHROID, LEVOTHROID) 75 MCG tablet Take 1 tablet (75 mcg total) by mouth daily. AM  . pantoprazole (PROTONIX) 40 MG tablet Take 1 tablet (40 mg total) by mouth daily before breakfast.  . quinapril (ACCUPRIL) 40 MG tablet Take 40 mg by mouth at bedtime.  . rosuvastatin (CRESTOR) 20 MG tablet Take 10 mg by mouth at bedtime. Reported on 05/10/2015  . tamsulosin (FLOMAX) 0.4 MG CAPS capsule Take 1 capsule (0.4 mg total) by mouth daily. (Patient not taking: Reported on 06/22/2015)   No current facility-administered medications on file prior to visit.   Allergies  Allergen Reactions  . Dexilant [Dexlansoprazole] Rash  . Primidone Itching and Rash    rash   Past Surgical History  Procedure Laterality Date  . Total knee arthroplasty Left   . Colonoscopy  03/17/12    Dr Byrnett-diverticulosis  . Cardiac catheterization    . Eye surgery Bilateral     cataract   . Esophagogastroduodenoscopy (egd) with propofol N/A 11/25/2014    Procedure: ESOPHAGOGASTRODUODENOSCOPY (EGD) WITH PROPOFOL;  Surgeon: Lucilla Lame, MD;  Location: [facility];  Service: Endoscopy;  Laterality: N/A;  with biopsy  . Esophagogastroduodenoscopy (egd) with propofol N/A 01/05/2015    Procedure: ESOPHAGOGASTRODUODENOSCOPY (EGD) WITH PROPOFOL;  Surgeon: Lucilla Lame, MD;  Location: [facility];  Service: Endoscopy;  Laterality: N/A;   Social History   Social History  . Marital Status: Married    Spouse Name: N/A  . Number of Children: 4  . Years of Education: N/A   Occupational History  . retired     [employer]   Social History Main Topics  . Smoking status: Former Smoker -- 2.00 packs/day for 31 years    Types: Cigarettes    Quit date: 12/24/1980  .  Smokeless tobacco: Not on file     Comment: quit in 1982  . Alcohol Use: No     Comment: occasionally  . Drug Use: No  . Sexual Activity: Not on file   Other Topics Concern  . 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 Heart failure Mother 59    congestive  . Heart attack Father 39  . Alcohol abuse Father   . Alzheimer's disease Brother 89  . Alzheimer's disease Sister 65  . Colon cancer Neg Hx   . Liver disease Neg Hx       Objective:   Physical Exam  Constitutional: He appears well-developed and well-nourished.  HENT:  Right Ear: Tympanic membrane is not erythematous and not bulging. No middle ear effusion.  Left Ear: Tympanic membrane is not erythematous and not bulging.  No middle ear effusion.  Mouth/Throat: Oropharynx is clear and moist.  Pulmonary/Chest:  Effort normal and breath sounds normal. No respiratory distress. He has no wheezes.  Psychiatric: He has a normal mood and affect. His behavior is normal.      Assessment & Plan:  1. Bronchitis New problem. Treat as below. Advised pt to continue gargling with salt water, can add throat lozenge. Also start Robitussin DM for cough. Call if sx worsen, do not improve. - amoxicillin-clavulanate (AUGMENTIN) 875-125 MG tablet; Take 1 tablet by mouth 2 (two) times daily.  Dispense: 14 tablet; Refill: 0   Patient seen and examined by [name], MD, and note scribed by [name], CMA.

## 2015-07-04 ENCOUNTER — Telehealth: Payer: Self-pay | Admitting: Family Medicine

## 2015-07-04 NOTE — Telephone Encounter (Signed)
Pt would like to know if he and his wife should come back in for a second shingle shot b/c he thinks it has been more than 5 years since his last shingles shot. Wife's name is Nature conservation officer. DOB: 07/07[birth year]40. Please advise. Thanks TNP

## 2015-07-04 NOTE — Telephone Encounter (Signed)
Pt advised that Shingels shot is just a 1 time shot and does not need to be repeated.-aa

## 2015-07-05 DIAGNOSIS — E6609 Other obesity due to excess calories: Secondary | ICD-10-CM | POA: Diagnosis not present

## 2015-07-05 DIAGNOSIS — G4733 Obstructive sleep apnea (adult) (pediatric): Secondary | ICD-10-CM | POA: Diagnosis not present

## 2015-07-05 DIAGNOSIS — G250 Essential tremor: Secondary | ICD-10-CM | POA: Diagnosis not present

## 2015-09-19 ENCOUNTER — Telehealth: Payer: Self-pay | Admitting: Family Medicine

## 2015-09-19 NOTE — Telephone Encounter (Signed)
Pt reports that he has Cipro 500 mg BID for 10 days on hand and started taking it and feeling better already. Do you want to see pt after he is finished to see if it is cleared or would you rather see him before? He also would like another refill to have on hand again in case this happens again. He says that his UTIs normally start in the middle of the night and this saves him a trip to the ER. Please advise.

## 2015-09-19 NOTE — Telephone Encounter (Signed)
Pt stated that he has had increase frequency urinating and on Saturday 09/16/15 it started burning when he voided. Pt started ciprofloxacin (CIPRO) 500 MG tablet 09/16/15 because he had it on hand. Pt stated that the symptoms have started improving and wanted to know should he come in for an OV. Please advise. Thanks TNP

## 2015-09-20 ENCOUNTER — Other Ambulatory Visit: Payer: Self-pay

## 2015-09-20 ENCOUNTER — Telehealth: Payer: Self-pay | Admitting: Gastroenterology

## 2015-09-20 DIAGNOSIS — B9681 Helicobacter pylori [H. pylori] as the cause of diseases classified elsewhere: Secondary | ICD-10-CM

## 2015-09-20 DIAGNOSIS — K253 Acute gastric ulcer without hemorrhage or perforation: Principal | ICD-10-CM

## 2015-09-20 MED ORDER — PANTOPRAZOLE SODIUM 40 MG PO TBEC: 40.0000 mg | DELAYED_RELEASE_TABLET | Freq: Every day | ORAL | Status: DC

## 2015-09-20 NOTE — Telephone Encounter (Signed)
I do want to see him after this is done but the time can be just about any time. I would rather him have Septra DS on hand rather than Cipro.

## 2015-09-20 NOTE — Telephone Encounter (Signed)
Rx sent to pt's pharmacy per his request.  

## 2015-09-20 NOTE — Telephone Encounter (Signed)
Advised wife as below. Patient will call back for an appt.

## 2015-09-20 NOTE — Telephone Encounter (Signed)
Should patient continue to take Pantoprazole? His bottle says no refill

## 2015-10-02 ENCOUNTER — Ambulatory Visit (INDEPENDENT_AMBULATORY_CARE_PROVIDER_SITE_OTHER): Payer: PPO | Admitting: Family Medicine

## 2015-10-02 ENCOUNTER — Encounter: Payer: Self-pay | Admitting: Family Medicine

## 2015-10-02 VITALS — BP 124/62 | HR 64 | Temp 98.2°F | Resp 16 | Wt 284.0 lb

## 2015-10-02 DIAGNOSIS — R600 Localized edema: Secondary | ICD-10-CM | POA: Diagnosis not present

## 2015-10-02 DIAGNOSIS — R350 Frequency of micturition: Secondary | ICD-10-CM | POA: Diagnosis not present

## 2015-10-02 DIAGNOSIS — E785 Hyperlipidemia, unspecified: Secondary | ICD-10-CM | POA: Diagnosis not present

## 2015-10-02 DIAGNOSIS — R251 Tremor, unspecified: Secondary | ICD-10-CM

## 2015-10-02 DIAGNOSIS — R7303 Prediabetes: Secondary | ICD-10-CM | POA: Diagnosis not present

## 2015-10-02 DIAGNOSIS — E669 Obesity, unspecified: Secondary | ICD-10-CM | POA: Diagnosis not present

## 2015-10-02 LAB — POCT URINALYSIS DIPSTICK
Bilirubin, UA: NEGATIVE
Blood, UA: NEGATIVE
Glucose, UA: NEGATIVE
Ketones, UA: NEGATIVE
Leukocytes, UA: NEGATIVE
NITRITE UA: NEGATIVE
Protein, UA: NEGATIVE
Spec Grav, UA: 1.02
UROBILINOGEN UA: 0.2
pH, UA: 6

## 2015-10-02 MED ORDER — TOPIRAMATE 50 MG PO TABS: 50.0000 mg | ORAL_TABLET | Freq: Two times a day (BID) | ORAL | Status: DC

## 2015-10-02 MED ORDER — CIPROFLOXACIN HCL 500 MG PO TABS: 500.0000 mg | ORAL_TABLET | Freq: Two times a day (BID) | ORAL | Status: DC

## 2015-10-02 NOTE — Progress Notes (Signed)
Patient ID: Jared Rice, male   DOB: [birth year], 82 y.o.   MRN: [id number]    Subjective:  HPI  Patient is here for follow up:  On April 25th patient called stating that he has had urinary frequency, urgency and some burning with urination and that he started to take Cipro that he had on hand. He finished taking it on Friday May 5th-took it twice daily for 10 days. Patient states symptosm are better, he does mention that on Tuesday May 2nd he felt like he was getting increase urgency to urinate again but seems to be doing fine now. The history of having Cipro is per patient about 10 years ago he woke up around 3 am shivering and ended up going to ER was given IV Cipro and felt much better by 10 am that morning, he had sudden occurrence of episodes that time, then 4 years ago he had a similar episode he started to feel bad all of a sudden and just felt like this was UTI and went to urgent care and was giving an injection in the office and given Cipro. Patient then saw Dr. Madelin Headings for follow up and Dr. Madelin Headings gave patient RX for Cipro 60 tablets to have on hand to be able to take if he can not see a doctor right away so he does not end up at ER. Patient then saw Mariel Sleet in December 2016 for the urinary issues again and Mikki Santee gave another RX for Cipro to the patient to have an updated one.  Also patient has had some edema and weight gain, patient states he has not changed his eating habits or how much movement he does during the day. Patient does state he was taking off Topamax by neurologist since it was not really helping tremors and side effects could be worse than positive outcome from the medication. Patient states he remember when he was ut on it that doctor telling him it can make patient loose weight and wonders if this is contributing to gaining weight. He has SOB and chest discomfort on exertion that is chronic and had not changed in presentation. He does take Lasix 20 mg 1 tablet  daily.  Prior to Admission medications   Medication Sig Start Date End Date Taking? Authorizing Provider  aspirin 81 MG tablet Take 81 mg by mouth daily.   Yes Historical Provider, MD  diphenhydramine-acetaminophen (TYLENOL PM) 25-500 MG TABS Take 2 tablets by mouth at bedtime as needed.   Yes Historical Provider, MD  doxazosin (CARDURA) 8 MG tablet Take 8 mg by mouth at bedtime.     Yes Historical Provider, MD  furosemide (LASIX) 20 MG tablet Take 1 tablet (20 mg total) by mouth daily. AM 12/19/14  Yes Richard Maceo Pro., MD  levothyroxine (SYNTHROID, LEVOTHROID) 75 MCG tablet Take 1 tablet (75 mcg total) by mouth daily. AM 12/19/14  Yes Richard Maceo Pro., MD  pantoprazole (PROTONIX) 40 MG tablet Take 1 tablet (40 mg total) by mouth daily before breakfast. 09/20/15  Yes Lucilla Lame, MD  quinapril (ACCUPRIL) 40 MG tablet Take 40 mg by mouth at bedtime.   Yes Historical Provider, MD  rosuvastatin (CRESTOR) 20 MG tablet Take 10 mg by mouth at bedtime. Reported on 05/10/2015   Yes Historical Provider, MD    Patient Active Problem List   Diagnosis Date Noted  . BPH (benign prostatic hypertrophy) 02/22/2015  . Gastrointestinal ulcer   . History of peptic ulcer   . Gastritis   .  Hematochezia   . Acute gastric ulcer   . Melena 11/12/2014  . Malignant neoplasm of skin 09/21/2014  . Cataract 09/21/2014  . Colon polyp 09/21/2014  . CAFL (chronic airflow limitation) ([place]) 09/21/2014  . Diabetes ([place]) 09/21/2014  . DD (diverticular disease) 09/21/2014  . Benign essential tremor 09/21/2014  . Personal history of tobacco use, presenting hazards to health 09/21/2014  . Acid reflux 09/21/2014  . Hemorrhoids 09/21/2014  . 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 HLD (hyperlipidemia) 09/21/2014  . BP (high blood pressure) 09/21/2014  . Adult hypothyroidism 09/21/2014  . Lymphoma ([place]) 09/21/2014  . Malaise and fatigue 09/21/2014  . Mononeuritis 09/21/2014  . Muscle ache 09/21/2014  . Neuropathy  ([place]) 09/21/2014  . Numbness and tingling 09/21/2014  . Arthritis, degenerative 09/21/2014  . Adiposity 09/21/2014  . Awareness of heartbeats 09/21/2014  . Borderline diabetes 09/21/2014  . Acne erythematosa 09/21/2014  . Rotator cuff syndrome 09/21/2014  . Athlete's foot 09/21/2014  . Has a tremor 09/21/2014  . Stasis, venous 09/21/2014  . Avitaminosis D 09/21/2014  . Arthritis of knee, degenerative 11/16/2013  . Bradycardia, sinus 12/31/2011  . Left bundle branch block 12/31/2011  . First degree AV block 12/31/2011  . Edema of both legs 12/31/2011  . COPD UNSPECIFIED 11/09/2009  . HYPERTENSION, BENIGN 10/03/2009  . SLEEP APNEA 10/03/2009  . DYSPNEA 10/03/2009  . CAD, NATIVE VESSEL 05/04/2009  . Carotid disease, bilateral ([place]) 05/04/2009    Past Medical History  Diagnosis Date  . Precordial pain   . Unspecified sleep apnea   . Other dyspnea and respiratory abnormality     w/ pseuodowheeze resolves with purse lip manuever  . Chronic airway obstruction, not elsewhere classified   . Essential hypertension, benign   . Glucose intolerance (impaired glucose tolerance)   . Iron deficiency anemia   . Tremor   . Colon polyps   . Coronary atherosclerosis of native coronary artery     nonobstructive  . Anginal pain ([place])   . Hypothyroidism   . Occlusion and stenosis of carotid artery without mention of cerebral infarction     wears compression stockings  . Venous insufficiency   . Wears dentures     full upper  . Knee pain, right   . Shoulder pain, right     Social History   Social History  . Marital Status: Married    Spouse Name: N/A  . Number of Children: 4  . Years of Education: N/A   Occupational History  . retired     [employer]   Social History Main Topics  . Smoking status: Former Smoker -- 2.00 packs/day for 31 years    Types: Cigarettes    Quit date: 12/24/1980  . Smokeless tobacco: Never Used     Comment: quit in 1982  .  Alcohol Use: No     Comment: occasionally  . Drug Use: No  . Sexual Activity: Not on file   Other Topics Concern  . 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 Dexilant [Dexlansoprazole] Rash  . Primidone Itching and Rash    rash    Review of Systems  Constitutional: Negative.   HENT: Negative.   Eyes: Negative.   Respiratory: Negative for shortness of breath.   Cardiovascular: Positive for leg swelling. Negative for chest pain.  Gastrointestinal: Negative.   Genitourinary: Negative.   Skin: Negative.  Neurological: Negative.   Endo/Heme/Allergies: Negative.   Psychiatric/Behavioral: Negative.     Immunization History  Administered Date(s) Administered  . Influenza, High Dose Seasonal PF 02/18/2015  . Influenza-Unspecified 02/24/2013  . Pneumococcal Conjugate-13 04/14/2014  . Pneumococcal Polysaccharide-23 03/25/1997  . Td 08/29/2003  . Tdap 02/20/2011  . Zoster 05/12/2007   Objective:  BP 124/62 mmHg  Pulse 64  Temp(Src) 98.2 F (36.8 C)  Resp 16  Wt 284 lb (128.822 kg)   O2 saturation is 96%.  Physical Exam  Constitutional: He is oriented to person, place, and time and well-developed, well-nourished, and in no distress.  HENT:  Head: Normocephalic and atraumatic.  Right Ear: External ear normal.  Left Ear: External ear normal.  Nose: Nose normal.  Eyes: Conjunctivae are normal. Pupils are equal, round, and reactive to light.  Neck: Normal range of motion. Neck supple.  Cardiovascular: Normal rate, regular rhythm, normal heart sounds and intact distal pulses.   No murmur heard. Pulmonary/Chest: Effort normal and breath sounds normal. No respiratory distress. He has no wheezes.  Abdominal: Soft.  Musculoskeletal: He exhibits edema (1+).   1+ edema.  Neurological: He is alert and oriented to person, place, and time.  Skin: Skin is warm and dry.    Psychiatric: Mood, memory, affect and judgment normal.    Lab Results  Component Value Date   WBC 9.8 11/08/2014   HGB 10.6* 10/30/2011   HCT 35.1* 11/08/2014   PLT 169 11/08/2014   GLUCOSE 99 11/01/2014   CHOL 104 06/23/2013   TRIG 43 06/23/2013   HDL 42 11/01/2014   LDLCALC 53 06/23/2013   TSH 5.580* 11/01/2014   PSA 1.6 04/14/2014   HGBA1C 6.4* 11/01/2014    CMP     Component Value Date/Time   NA 142 11/01/2014 0839   NA 141 10/13/2013 1055   K 4.6 11/01/2014 0839   K 3.7 04/27/2014 1135   CL 102 11/01/2014 0839   CL 105 10/13/2013 1055   CO2 28 11/01/2014 0839   CO2 30 10/13/2013 1055   GLUCOSE 99 11/01/2014 0839   GLUCOSE 98 10/13/2013 1055   BUN 14 11/01/2014 0839   BUN 15 10/13/2013 1055   CREATININE 0.94 11/01/2014 0839   CREATININE 0.87 10/13/2013 1055   CREATININE 1.0 06/23/2013   CALCIUM 8.7 11/01/2014 0839   CALCIUM 8.6 10/13/2013 1055   PROT 5.8* 11/01/2014 0839   ALBUMIN 4.0 11/01/2014 0839   AST 9 11/01/2014 0839   ALT 8 11/01/2014 0839   ALKPHOS 67 11/01/2014 0839   BILITOT 0.4 11/01/2014 0839   GFRNONAA 77 11/01/2014 0839   GFRNONAA >60 10/13/2013 1055   GFRAA 89 11/01/2014 0839   GFRAA >60 10/13/2013 1055    Assessment and Plan :  1. Urinary frequency/ chronic prostatitis UA is normal today. Symptoms resolved. Will give another refill on Cipro to have on hand. - POCT Urinalysis Dipstick  2. Adiposity Gained 13 lbs in 3 months per our record. Patient was taking off Topamax and this has probably contributed. Discussed working on habits.  3. Hand  tremor Patient was taking off Topamax due to not thinking symptoms were managed but since been off the medication tremor has gottne worse some. Advised patient to re start Topamax at 1 tablet daily and then increase to twice daily at the end of the month. Will follow up on the next visit.  4. Localized edema/venous stasis Continue wearing compression stockings, advised patient if he looses  weight edema will improve and  other symptoms.  Patient was seen and examined by Dr. Eulas Post and note was scribed by Theressa Millard, RMA.   I have done the exam and reviewed the above chart and it is accurate to the best of my knowledge.  [name] MD [facility] 10/02/2015 2:58 PM

## 2015-10-03 ENCOUNTER — Telehealth: Payer: Self-pay | Admitting: Family Medicine

## 2015-10-03 DIAGNOSIS — L812 Freckles: Secondary | ICD-10-CM | POA: Diagnosis not present

## 2015-10-03 DIAGNOSIS — L570 Actinic keratosis: Secondary | ICD-10-CM | POA: Diagnosis not present

## 2015-10-03 DIAGNOSIS — L578 Other skin changes due to chronic exposure to nonionizing radiation: Secondary | ICD-10-CM | POA: Diagnosis not present

## 2015-10-03 DIAGNOSIS — E669 Obesity, unspecified: Secondary | ICD-10-CM | POA: Diagnosis not present

## 2015-10-03 DIAGNOSIS — D1800 Hemangioma unspecified site: Secondary | ICD-10-CM | POA: Diagnosis not present

## 2015-10-03 DIAGNOSIS — R7303 Prediabetes: Secondary | ICD-10-CM | POA: Diagnosis not present

## 2015-10-03 DIAGNOSIS — Z1283 Encounter for screening for malignant neoplasm of skin: Secondary | ICD-10-CM | POA: Diagnosis not present

## 2015-10-03 DIAGNOSIS — R350 Frequency of micturition: Secondary | ICD-10-CM | POA: Diagnosis not present

## 2015-10-03 DIAGNOSIS — E785 Hyperlipidemia, unspecified: Secondary | ICD-10-CM | POA: Diagnosis not present

## 2015-10-03 DIAGNOSIS — L821 Other seborrheic keratosis: Secondary | ICD-10-CM | POA: Diagnosis not present

## 2015-10-03 DIAGNOSIS — D692 Other nonthrombocytopenic purpura: Secondary | ICD-10-CM | POA: Diagnosis not present

## 2015-10-03 DIAGNOSIS — Z85828 Personal history of other malignant neoplasm of skin: Secondary | ICD-10-CM | POA: Diagnosis not present

## 2015-10-03 NOTE — Telephone Encounter (Signed)
Pt called for lab results from his Ua.  Call back is [phone]  Thanks, Con Memos

## 2015-10-03 NOTE — Telephone Encounter (Signed)
Pt advised it was ok-aa

## 2015-10-04 LAB — CBC WITH DIFFERENTIAL/PLATELET
Basophils Absolute: 0 10*3/uL (ref 0.0–0.2)
Basos: 1 %
EOS (ABSOLUTE): 0.4 10*3/uL (ref 0.0–0.4)
EOS: 7 %
HEMOGLOBIN: 11.5 g/dL — AB (ref 12.6–17.7)
Hematocrit: 35.9 % — ABNORMAL LOW (ref 37.5–51.0)
Immature Grans (Abs): 0 10*3/uL (ref 0.0–0.1)
Immature Granulocytes: 0 %
Lymphocytes Absolute: 1.5 10*3/uL (ref 0.7–3.1)
Lymphs: 25 %
MCH: 29.2 pg (ref 26.6–33.0)
MCHC: 32 g/dL (ref 31.5–35.7)
MCV: 91 fL (ref 79–97)
MONOCYTES: 7 %
MONOS ABS: 0.4 10*3/uL (ref 0.1–0.9)
NEUTROS ABS: 3.7 10*3/uL (ref 1.4–7.0)
Neutrophils: 60 %
Platelets: 167 10*3/uL (ref 150–379)
RBC: 3.94 x10E6/uL — ABNORMAL LOW (ref 4.14–5.80)
RDW: 14.4 % (ref 12.3–15.4)
WBC: 6.1 10*3/uL (ref 3.4–10.8)

## 2015-10-04 LAB — COMPREHENSIVE METABOLIC PANEL
A/G RATIO: 2.2 (ref 1.2–2.2)
ALT: 9 IU/L (ref 0–44)
AST: 13 IU/L (ref 0–40)
Albumin: 3.9 g/dL (ref 3.5–4.7)
Alkaline Phosphatase: 59 IU/L (ref 39–117)
BILIRUBIN TOTAL: 0.3 mg/dL (ref 0.0–1.2)
BUN/Creatinine Ratio: 19 (ref 10–24)
BUN: 17 mg/dL (ref 8–27)
CHLORIDE: 104 mmol/L (ref 96–106)
CO2: 26 mmol/L (ref 18–29)
Calcium: 8.5 mg/dL — ABNORMAL LOW (ref 8.6–10.2)
Creatinine, Ser: 0.9 mg/dL (ref 0.76–1.27)
GFR calc Af Amer: 93 mL/min/{1.73_m2} (ref >59)
GFR, EST NON AFRICAN AMERICAN: 80 mL/min/{1.73_m2} (ref >59)
GLOBULIN, TOTAL: 1.8 g/dL (ref 1.5–4.5)
Glucose: 100 mg/dL — ABNORMAL HIGH (ref 65–99)
POTASSIUM: 4.5 mmol/L (ref 3.5–5.2)
SODIUM: 144 mmol/L (ref 134–144)
Total Protein: 5.7 g/dL — ABNORMAL LOW (ref 6.0–8.5)

## 2015-10-04 LAB — LIPID PANEL WITH LDL/HDL RATIO
Cholesterol, Total: 108 mg/dL (ref 100–199)
HDL: 40 mg/dL (ref >39)
LDL CALC: 60 mg/dL (ref 0–99)
LDl/HDL Ratio: 1.5 ratio units (ref 0.0–3.6)
Triglycerides: 38 mg/dL (ref 0–149)
VLDL Cholesterol Cal: 8 mg/dL (ref 5–40)

## 2015-10-04 LAB — HEMOGLOBIN A1C
Est. average glucose Bld gHb Est-mCnc: 128 mg/dL
Hgb A1c MFr Bld: 6.1 % — ABNORMAL HIGH (ref 4.8–5.6)

## 2015-10-04 LAB — TSH: TSH: 3.31 u[IU]/mL (ref 0.450–4.500)

## 2015-10-05 ENCOUNTER — Other Ambulatory Visit: Payer: Self-pay | Admitting: Family Medicine

## 2015-10-19 ENCOUNTER — Other Ambulatory Visit: Payer: Self-pay | Admitting: Family Medicine

## 2015-10-24 DIAGNOSIS — M1711 Unilateral primary osteoarthritis, right knee: Secondary | ICD-10-CM | POA: Diagnosis not present

## 2015-10-31 DIAGNOSIS — M1711 Unilateral primary osteoarthritis, right knee: Secondary | ICD-10-CM | POA: Diagnosis not present

## 2015-11-07 DIAGNOSIS — M1711 Unilateral primary osteoarthritis, right knee: Secondary | ICD-10-CM | POA: Diagnosis not present

## 2015-11-08 ENCOUNTER — Ambulatory Visit: Payer: PPO | Admitting: Family Medicine

## 2015-11-29 ENCOUNTER — Ambulatory Visit (INDEPENDENT_AMBULATORY_CARE_PROVIDER_SITE_OTHER): Payer: PPO | Admitting: Family Medicine

## 2015-11-29 ENCOUNTER — Encounter: Payer: Self-pay | Admitting: Family Medicine

## 2015-11-29 VITALS — BP 116/56 | HR 60 | Temp 97.9°F | Resp 16 | Wt 273.0 lb

## 2015-11-29 DIAGNOSIS — G629 Polyneuropathy, unspecified: Secondary | ICD-10-CM

## 2015-11-29 DIAGNOSIS — R5382 Chronic fatigue, unspecified: Secondary | ICD-10-CM

## 2015-11-29 NOTE — Progress Notes (Signed)
Patient: Jared Rice Male    DOB: 02/13/1933   82 y.o.   MRN: [id number] Visit Date: 11/29/2015  Today's Provider: Wilhemena Durie, MD   Chief Complaint  Patient presents with  . Numbness   Subjective:    HPI    Foot Problem: Patient complains of foot pain and numbness bilaterally. The patient has had this problem of bilateral feet for several weeks. Past history significant for diabetes. Last A1C was 10/03/2015 and was 6.1%. Pt also has a history of hypothyroidism. Last TSH was 10/03/2015 and was 3.310.   Allergies  Allergen Reactions  . Dexilant [Dexlansoprazole] Rash  . Primidone Itching and Rash    rash   Current Meds  Medication Sig  . aspirin 81 MG tablet Take 81 mg by mouth daily.  . CRESTOR 20 MG tablet Take 1 tablet by mouth every day  . diphenhydramine-acetaminophen (TYLENOL PM) 25-500 MG TABS Take 2 tablets by mouth at bedtime as needed.  . doxazosin (CARDURA) 8 MG tablet Take 8 mg by mouth at bedtime.    . furosemide (LASIX) 20 MG tablet Take 1 tablet (20 mg total) by mouth daily. AM  . levothyroxine (SYNTHROID, LEVOTHROID) 75 MCG tablet Take 1 tablet (75 mcg total) by mouth daily. AM  . pantoprazole (PROTONIX) 40 MG tablet Take 1 tablet (40 mg total) by mouth daily before breakfast.  . quinapril (ACCUPRIL) 40 MG tablet Take 1 tablet by mouth daily  . topiramate (TOPAMAX) 50 MG tablet Take 1 tablet (50 mg total) by mouth 2 (two) times daily.    Review of Systems  Constitutional: Positive for appetite change (due to Topamax). Negative for fever, chills, diaphoresis, activity change, fatigue and unexpected weight change.  Respiratory: Negative for cough, shortness of breath and wheezing.   Cardiovascular: Negative for chest pain, palpitations and leg swelling.  Neurological: Positive for numbness.    Social History  Substance Use Topics  . Smoking status: Former Smoker -- 2.00 packs/day for 31 years    Types: Cigarettes    Quit date: 12/24/1980    . Smokeless tobacco: Never Used     Comment: quit in 1982  . Alcohol Use: No     Comment: occasionally   Objective:   BP 116/56 mmHg  Pulse 60  Temp(Src) 97.9 F (36.6 C) (Oral)  Resp 16  Wt 273 lb (123.832 kg)  Physical Exam  Constitutional: He is oriented to person, place, and time. He appears well-developed and well-nourished.  HENT:  Head: Normocephalic and atraumatic.  Neck: Normal range of motion. Neck supple. No thyromegaly present.  Cardiovascular: Normal rate, regular rhythm and normal heart sounds.   Pulmonary/Chest: Effort normal and breath sounds normal. No respiratory distress.  Abdominal: Soft.  Musculoskeletal: He exhibits edema.  1+ lower extremity edema  Neurological: He is alert and oriented to person, place, and time.  No sensation in toes on monofilament  Skin: Skin is warm and dry.  Psychiatric: He has a normal mood and affect. His behavior is normal. Judgment and thought content normal.        Assessment & Plan:     1. Chronic fatigue Will check labs. - CBC with Differential/Platelet  2. Neuropathy ([place]) Unclear etiology. TSH checked 2 months ago. Was WNL. Will check B12 today. FU pending results. - Vitamin B12  Diabetic neuropathy versus possible B12 deficiency versus unknown etiology. 3. Venous insufficiency Patient more comfortable since wearing support hose for this.    Patient  seen and examined by [name], MD, and note scribed by [name], CMA.   Richard Cranford Mon, MD  [facility]

## 2015-11-30 LAB — CBC WITH DIFFERENTIAL/PLATELET
BASOS: 0 %
Basophils Absolute: 0 10*3/uL (ref 0.0–0.2)
EOS (ABSOLUTE): 0.4 10*3/uL (ref 0.0–0.4)
EOS: 5 %
HEMATOCRIT: 39 % (ref 37.5–51.0)
Hemoglobin: 12.3 g/dL — ABNORMAL LOW (ref 12.6–17.7)
IMMATURE GRANULOCYTES: 0 %
Immature Grans (Abs): 0 10*3/uL (ref 0.0–0.1)
LYMPHS ABS: 1.5 10*3/uL (ref 0.7–3.1)
Lymphs: 21 %
MCH: 28.3 pg (ref 26.6–33.0)
MCHC: 31.5 g/dL (ref 31.5–35.7)
MCV: 90 fL (ref 79–97)
MONOS ABS: 0.5 10*3/uL (ref 0.1–0.9)
Monocytes: 7 %
NEUTROS ABS: 4.8 10*3/uL (ref 1.4–7.0)
NEUTROS PCT: 67 %
PLATELETS: 162 10*3/uL (ref 150–379)
RBC: 4.34 x10E6/uL (ref 4.14–5.80)
RDW: 15 % (ref 12.3–15.4)
WBC: 7.3 10*3/uL (ref 3.4–10.8)

## 2015-11-30 LAB — VITAMIN B12: Vitamin B-12: 315 pg/mL (ref 211–946)

## 2015-12-04 ENCOUNTER — Other Ambulatory Visit: Payer: Self-pay | Admitting: Family Medicine

## 2015-12-29 NOTE — Telephone Encounter (Signed)
error 

## 2016-01-12 ENCOUNTER — Other Ambulatory Visit: Payer: Self-pay | Admitting: Family Medicine

## 2016-01-12 NOTE — Telephone Encounter (Signed)
Has appointment 02/05/2016. [name], CMA

## 2016-01-23 ENCOUNTER — Encounter: Payer: Self-pay | Admitting: Family Medicine

## 2016-01-23 ENCOUNTER — Ambulatory Visit (INDEPENDENT_AMBULATORY_CARE_PROVIDER_SITE_OTHER): Payer: PPO | Admitting: Family Medicine

## 2016-01-23 VITALS — BP 110/62 | HR 80 | Temp 97.9°F | Resp 20 | Wt 273.0 lb

## 2016-01-23 DIAGNOSIS — E785 Hyperlipidemia, unspecified: Secondary | ICD-10-CM

## 2016-01-23 DIAGNOSIS — I10 Essential (primary) hypertension: Secondary | ICD-10-CM

## 2016-01-23 DIAGNOSIS — E039 Hypothyroidism, unspecified: Secondary | ICD-10-CM | POA: Diagnosis not present

## 2016-01-23 DIAGNOSIS — J069 Acute upper respiratory infection, unspecified: Secondary | ICD-10-CM

## 2016-01-23 NOTE — Patient Instructions (Addendum)
Increase fluids, get over the counter Generic Robitussin DM for the cough and to loosen mucus. Call if you do not improve.

## 2016-01-23 NOTE — Progress Notes (Signed)
Subjective:  HPI Pt is here for for cold symptoms. Symptoms started 3-4 days ago. Started with a scratchy throat, cough, congestion, shortness of breath and the beginnings of ear problems. Denies fevers, sweats and body aches.    Prior to Admission medications   Medication Sig Start Date End Date Taking? Authorizing Provider  aspirin 81 MG tablet Take 81 mg by mouth daily.    Historical Provider, MD  CRESTOR 20 MG tablet Take 1 tablet by mouth every day 10/19/15   Birdie Sons, MD  diphenhydramine-acetaminophen (TYLENOL PM) 25-500 MG TABS Take 2 tablets by mouth at bedtime as needed.    Historical Provider, MD  doxazosin (CARDURA) 8 MG tablet Take 1 tablet by mouth once daily 12/04/15   Jerrol Banana., MD  furosemide (LASIX) 20 MG tablet Take 1 tablet by mouth daily in the morning 01/15/16   Jerrol Banana., MD  levothyroxine (SYNTHROID, LEVOTHROID) 75 MCG tablet Take 1 tablet by mouth daily in the morning 01/15/16   Jerrol Banana., MD  pantoprazole (PROTONIX) 40 MG tablet Take 1 tablet (40 mg total) by mouth daily before breakfast. 09/20/15   Lucilla Lame, MD  quinapril (ACCUPRIL) 40 MG tablet Take 1 tablet by mouth daily 10/05/15   Jerrol Banana., MD  topiramate (TOPAMAX) 50 MG tablet Take 1 tablet (50 mg total) by mouth 2 (two) times daily. 10/02/15   Dontreal Miera Maceo Pro., MD    Patient Active Problem List   Diagnosis Date Noted  . Chronic fatigue 11/29/2015  . BPH (benign prostatic hypertrophy) 02/22/2015  . Gastrointestinal ulcer   . History of peptic ulcer   . Gastritis   . Hematochezia   . Acute gastric ulcer   . Melena 11/12/2014  . Malignant neoplasm of skin 09/21/2014  . Cataract 09/21/2014  . Colon polyp 09/21/2014  . CAFL (chronic airflow limitation) ([place]) 09/21/2014  . Diabetes ([place]) 09/21/2014  . DD (diverticular disease) 09/21/2014  . Benign essential tremor 09/21/2014  . Personal history of tobacco use, presenting hazards to health  09/21/2014  . Acid reflux 09/21/2014  . Hemorrhoids 09/21/2014  . 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 HLD (hyperlipidemia) 09/21/2014  . BP (high blood pressure) 09/21/2014  . Adult hypothyroidism 09/21/2014  . Lymphoma ([place]) 09/21/2014  . Malaise and fatigue 09/21/2014  . Mononeuritis 09/21/2014  . Muscle ache 09/21/2014  . Neuropathy ([place]) 09/21/2014  . Numbness and tingling 09/21/2014  . Arthritis, degenerative 09/21/2014  . Adiposity 09/21/2014  . Awareness of heartbeats 09/21/2014  . Borderline diabetes 09/21/2014  . Acne erythematosa 09/21/2014  . Rotator cuff syndrome 09/21/2014  . Athlete's foot 09/21/2014  . Has a tremor 09/21/2014  . Stasis, venous 09/21/2014  . Avitaminosis D 09/21/2014  . Arthritis of knee, degenerative 11/16/2013  . Bradycardia, sinus 12/31/2011  . Left bundle branch block 12/31/2011  . First degree AV block 12/31/2011  . Edema of both legs 12/31/2011  . COPD UNSPECIFIED 11/09/2009  . HYPERTENSION, BENIGN 10/03/2009  . SLEEP APNEA 10/03/2009  . DYSPNEA 10/03/2009  . CAD, NATIVE VESSEL 05/04/2009  . Carotid disease, bilateral ([place]) 05/04/2009    Past Medical History:  Diagnosis Date  . Anginal pain ([place])   . Chronic airway obstruction, not elsewhere classified   . Colon polyps   . Coronary atherosclerosis of native coronary artery    nonobstructive  . Essential hypertension, benign   . Glucose intolerance (impaired glucose tolerance)   . Hypothyroidism   .  Iron deficiency anemia   . Knee pain, right   . Occlusion and stenosis of carotid artery without mention of cerebral infarction    wears compression stockings  . Other dyspnea and respiratory abnormality    w/ pseuodowheeze resolves with purse lip manuever  . Precordial pain   . Shoulder pain, right   . Tremor   . Unspecified sleep apnea   . Venous insufficiency   . Wears dentures    full upper    Social History   Social History  . Marital status: Married    Spouse name:  N/A  . Number of children: 4  . Years of education: N/A   Occupational History  . retired     [employer]   Social History Main Topics  . Smoking status: Former Smoker    Packs/day: 2.00    Years: 31.00    Types: Cigarettes    Quit date: 12/24/1980  . Smokeless tobacco: Never Used     Comment: quit in 1982  . Alcohol use No     Comment: occasionally  . Drug use: No  . Sexual activity: Not on file   Other Topics Concern  . 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 Dexilant [Dexlansoprazole] Rash  . Primidone Itching and Rash    rash    Review of Systems  Constitutional: Positive for malaise/fatigue.  HENT: Positive for congestion, ear pain and sore throat.   Eyes: Negative.   Respiratory: Positive for cough, sputum production and shortness of breath.   Gastrointestinal: Negative.   Genitourinary: Negative.   Musculoskeletal: Negative.   Skin: Negative.   Neurological: Negative.   Endo/Heme/Allergies: Negative.   Psychiatric/Behavioral: Negative.     Immunization History  Administered Date(s) Administered  . Influenza, High Dose Seasonal PF 02/18/2015  . Influenza-Unspecified 02/24/2013  . Pneumococcal Conjugate-13 04/14/2014  . Pneumococcal Polysaccharide-23 03/25/1997  . Td 08/29/2003  . Tdap 02/20/2011  . Zoster 05/12/2007   Objective:  BP 110/62 (BP Location: Left Arm, Patient Position: Sitting, Cuff Size: Large)   Pulse 80   Temp 97.9 F (36.6 C) (Oral)   Resp 20   Wt 273 lb (123.8 kg)   SpO2 97%   BMI 35.05 kg/m   Physical Exam  Constitutional: He is oriented to person, place, and time and well-developed, well-nourished, and in no distress.  HENT:  Head: Normocephalic and atraumatic.  Right Ear: External ear normal.  Left Ear: External ear normal.  Nose: Nose normal.  Mouth/Throat: Oropharynx is clear and  moist.  Eyes: Conjunctivae and EOM are normal. Pupils are equal, round, and reactive to light.  Neck: Normal range of motion. Neck supple.  Cardiovascular: Normal rate, regular rhythm, normal heart sounds and intact distal pulses.   Pulmonary/Chest: Effort normal and breath sounds normal.  Musculoskeletal: Normal range of motion.  Neurological: He is alert and oriented to person, place, and time. He has normal reflexes. Gait normal. GCS score is 15.  Skin: Skin is warm and dry.  Psychiatric: Mood, memory, affect and judgment normal.    Lab Results  Component Value Date   WBC 7.3 11/29/2015   HGB 10.6 (A) 10/30/2011   HCT 39.0 11/29/2015   PLT 162 11/29/2015   GLUCOSE 100 (H) 10/03/2015   CHOL 108 10/03/2015   TRIG 38 10/03/2015   HDL 40 10/03/2015   LDLCALC 60 10/03/2015  TSH 3.310 10/03/2015   PSA 1.6 04/14/2014   HGBA1C 6.1 (H) 10/03/2015    CMP     Component Value Date/Time   NA 144 10/03/2015 0845   NA 141 10/13/2013 1055   K 4.5 10/03/2015 0845   K 3.7 04/27/2014 1135   CL 104 10/03/2015 0845   CL 105 10/13/2013 1055   CO2 26 10/03/2015 0845   CO2 30 10/13/2013 1055   GLUCOSE 100 (H) 10/03/2015 0845   GLUCOSE 98 10/13/2013 1055   BUN 17 10/03/2015 0845   BUN 15 10/13/2013 1055   CREATININE 0.90 10/03/2015 0845   CREATININE 0.87 10/13/2013 1055   CALCIUM 8.5 (L) 10/03/2015 0845   CALCIUM 8.6 10/13/2013 1055   PROT 5.7 (L) 10/03/2015 0845   ALBUMIN 3.9 10/03/2015 0845   AST 13 10/03/2015 0845   ALT 9 10/03/2015 0845   ALKPHOS 59 10/03/2015 0845   BILITOT 0.3 10/03/2015 0845   GFRNONAA 80 10/03/2015 0845   GFRNONAA >60 10/13/2013 1055   GFRAA 93 10/03/2015 0845   GFRAA >60 10/13/2013 1055    Assessment and Plan :  1. URI (upper respiratory infection) Increase fluids, get over the counter Generic Robitussin DM for the cough and to loosen mucus. Call if you do not improve.Discussed with patient I do not think antibiotics would help him at this time. He  will call back if he worsens.  2. Essential hypertension Stable. Follow up in 3 months.   3. HLD (hyperlipidemia)   4. Hypothyroidism, unspecified hypothyroidism type 5.Morbid obesity  Patient was seen and examined by Dr. [name], and noted scribed by Webb Laws, CMA I have done the exam and reviewed the above chart and it is accurate to the best of my knowledge.  [name] MD [facility] 01/23/2016 2:04 PM

## 2016-01-31 ENCOUNTER — Ambulatory Visit: Payer: PPO | Admitting: Family Medicine

## 2016-02-05 ENCOUNTER — Ambulatory Visit: Payer: PPO | Admitting: Family Medicine

## 2016-02-12 ENCOUNTER — Encounter: Payer: Self-pay | Admitting: Cardiology

## 2016-02-12 DIAGNOSIS — M7581 Other shoulder lesions, right shoulder: Secondary | ICD-10-CM | POA: Diagnosis not present

## 2016-02-12 DIAGNOSIS — M25511 Pain in right shoulder: Secondary | ICD-10-CM | POA: Diagnosis not present

## 2016-02-12 DIAGNOSIS — G8929 Other chronic pain: Secondary | ICD-10-CM | POA: Diagnosis not present

## 2016-02-22 NOTE — Progress Notes (Signed)
Jared Rice Date of Birth: 1933-02-01 Medical Record [id number]  History of Present Illness: Jared Rice is seen today for follow up angina.  He has had several cardiac caths over the years revealing nonobstructive disease with scattered 20-30% lesions. He had a nuclear stress test in January 2012 which was normal. He had an echocardiogram in August 2013 which showed LVH with normal systolic function and mildly elevated PA systolic pressure. Patient has a history of morbid obesity with obstructive sleep apnea. He has not used the CPAP  because he could never get comfortable with this.   On followup today he states he is doing well. He has lost an additional 11 lbs.  Propranolol was discontinued due to bradycardia. He denies any chest pain or SOB. He is fairly sedentary. He wears support hose and has no edema.  Current Outpatient Prescriptions on File Prior to Visit  Medication Sig Dispense Refill  . aspirin 81 MG tablet Take 81 mg by mouth daily.    . CRESTOR 20 MG tablet Take 1 tablet by mouth every day 90 tablet 1  . diphenhydramine-acetaminophen (TYLENOL PM) 25-500 MG TABS Take 2 tablets by mouth at bedtime as needed.    . doxazosin (CARDURA) 8 MG tablet Take 1 tablet by mouth once daily 90 tablet 3  . furosemide (LASIX) 20 MG tablet Take 1 tablet by mouth daily in the morning 90 tablet 2  . levothyroxine (SYNTHROID, LEVOTHROID) 75 MCG tablet Take 1 tablet by mouth daily in the morning 90 tablet 3  . pantoprazole (PROTONIX) 40 MG tablet Take 1 tablet (40 mg total) by mouth daily before breakfast. 30 tablet 11  . quinapril (ACCUPRIL) 40 MG tablet Take 1 tablet by mouth daily 90 tablet 3  . topiramate (TOPAMAX) 50 MG tablet Take 1 tablet (50 mg total) by mouth 2 (two) times daily. 60 tablet 5   No current facility-administered medications on file prior to visit.     Allergies  Allergen Reactions  . Dexilant [Dexlansoprazole] Rash  . Primidone Itching and Rash    rash    Past  Medical History:  Diagnosis Date  . Anginal pain ([place])   . Chronic airway obstruction, not elsewhere classified   . Colon polyps   . Coronary atherosclerosis of native coronary artery    nonobstructive  . Essential hypertension, benign   . Glucose intolerance (impaired glucose tolerance)   . Hypothyroidism   . Iron deficiency anemia   . Knee pain, right   . Occlusion and stenosis of carotid artery without mention of cerebral infarction    wears compression stockings  . Other dyspnea and respiratory abnormality    w/ pseuodowheeze resolves with purse lip manuever  . Precordial pain   . Shoulder pain, right   . Tremor   . Unspecified sleep apnea   . Venous insufficiency   . Wears dentures    full upper    Past Surgical History:  Procedure Laterality Date  . CARDIAC CATHETERIZATION    . COLONOSCOPY  03/17/12   Dr Byrnett-diverticulosis  . ESOPHAGOGASTRODUODENOSCOPY (EGD) WITH PROPOFOL N/A 11/25/2014   Procedure: ESOPHAGOGASTRODUODENOSCOPY (EGD) WITH PROPOFOL;  Surgeon: Lucilla Lame, MD;  Location: [facility];  Service: Endoscopy;  Laterality: N/A;  with biopsy  . ESOPHAGOGASTRODUODENOSCOPY (EGD) WITH PROPOFOL N/A 01/05/2015   Procedure: ESOPHAGOGASTRODUODENOSCOPY (EGD) WITH PROPOFOL;  Surgeon: Lucilla Lame, MD;  Location: [facility];  Service: Endoscopy;  Laterality: N/A;  . EYE SURGERY Bilateral    cataract   .  TOTAL KNEE ARTHROPLASTY Left     History  Smoking Status  . Former Smoker  . Packs/day: 2.00  . Years: 31.00  . Types: Cigarettes  . Quit date: 12/24/1980  Smokeless Tobacco  . Never Used    Comment: quit in 1982    History  Alcohol Use No    Comment: occasionally    Family History  Problem Relation Age of Onset  . Heart failure Mother 67    congestive  . Heart attack Father 14  . Alcohol abuse Father   . Alzheimer's disease Brother 86  . Alzheimer's disease Sister 53  . Colon cancer Neg Hx   . Liver disease Neg Hx     Review of  Systems: The review of systems is as noted in HPI. All other systems were reviewed and are negative.  Physical Exam: BP 138/76   Pulse (!) 55   Ht 6\' 2"  (1.88 m)   Wt 266 lb (120.7 kg)   BMI 34.15 kg/m  He is an obese white male in no acute distress. HEENT: Normocephalic, atraumatic. Pupils equal round and reactive light accommodation. Extraocular movements are full. Oropharynx is clear. Neck is without JVD, adenopathy, thyromegaly, or bruits. Lungs: Clear Cardiovascular: Regular rate and rhythm. Normal S1 and S2. No gallop, murmur, or click. Abdomen: Morbidly obese, soft, nontender. Bowel sounds are positive. No masses. Extremities. no lower extremity edema. Wearing support hose. Chronic hyperpigmentation. Pulses are 2+. Neuro: Alert and oriented x3. Cranial nerves II through XII are intact.  LABORATORY DATA: ECG today shows NSR with LBBB. Rate 55. No change. I have personally reviewed and interpreted this study.  Lab Results  Component Value Date   WBC 7.3 11/29/2015   HGB 10.6 (A) 10/30/2011   HCT 39.0 11/29/2015   PLT 162 11/29/2015   GLUCOSE 100 (H) 10/03/2015   CHOL 108 10/03/2015   TRIG 38 10/03/2015   HDL 40 10/03/2015   LDLCALC 60 10/03/2015   ALT 9 10/03/2015   AST 13 10/03/2015   NA 144 10/03/2015   K 4.5 10/03/2015   CL 104 10/03/2015   CREATININE 0.90 10/03/2015   BUN 17 10/03/2015   CO2 26 10/03/2015   TSH 3.310 10/03/2015   PSA 1.6 04/14/2014   HGBA1C 6.1 (H) 10/03/2015      Assessment / Plan: 1. Marked sinus bradycardia.Improved off beta blocker therapy. No other arrhythmia noted.  2. Coronary disease-nonobstructive. Patient has no significant angina. Continue risk factor modification. 3. Carotid arterial disease-nonobstructive by doppler this year.  4. Morbid obesity with obstructive sleep apnea. I recommended increased aerobic activity. Encourage continued weight loss. 5. Chronic left bundle branch block. 6. Chronic edema secondary to venous  insufficiency. I recommended sodium restriction and use of compression hose.  I'll followup again in one year.

## 2016-02-23 ENCOUNTER — Encounter: Payer: Self-pay | Admitting: Cardiology

## 2016-02-23 ENCOUNTER — Ambulatory Visit (INDEPENDENT_AMBULATORY_CARE_PROVIDER_SITE_OTHER): Payer: PPO | Admitting: Cardiology

## 2016-02-23 VITALS — BP 138/76 | HR 55 | Ht 74.0 in | Wt 266.0 lb

## 2016-02-23 DIAGNOSIS — I2510 Atherosclerotic heart disease of native coronary artery without angina pectoris: Secondary | ICD-10-CM | POA: Diagnosis not present

## 2016-02-23 DIAGNOSIS — I447 Left bundle-branch block, unspecified: Secondary | ICD-10-CM | POA: Diagnosis not present

## 2016-02-23 DIAGNOSIS — I779 Disorder of arteries and arterioles, unspecified: Secondary | ICD-10-CM

## 2016-02-23 DIAGNOSIS — I739 Peripheral vascular disease, unspecified: Secondary | ICD-10-CM

## 2016-02-23 DIAGNOSIS — E785 Hyperlipidemia, unspecified: Secondary | ICD-10-CM

## 2016-02-23 NOTE — Patient Instructions (Signed)
Continue your current therapy  I encourage you to increase your aerobic activity  I will see you in one year.

## 2016-02-29 ENCOUNTER — Ambulatory Visit (INDEPENDENT_AMBULATORY_CARE_PROVIDER_SITE_OTHER): Payer: PPO

## 2016-02-29 DIAGNOSIS — Z23 Encounter for immunization: Secondary | ICD-10-CM

## 2016-04-04 DIAGNOSIS — L578 Other skin changes due to chronic exposure to nonionizing radiation: Secondary | ICD-10-CM | POA: Diagnosis not present

## 2016-04-04 DIAGNOSIS — L570 Actinic keratosis: Secondary | ICD-10-CM | POA: Diagnosis not present

## 2016-04-04 DIAGNOSIS — D692 Other nonthrombocytopenic purpura: Secondary | ICD-10-CM | POA: Diagnosis not present

## 2016-04-04 DIAGNOSIS — Z85828 Personal history of other malignant neoplasm of skin: Secondary | ICD-10-CM | POA: Diagnosis not present

## 2016-04-04 DIAGNOSIS — Z1283 Encounter for screening for malignant neoplasm of skin: Secondary | ICD-10-CM | POA: Diagnosis not present

## 2016-04-04 DIAGNOSIS — L821 Other seborrheic keratosis: Secondary | ICD-10-CM | POA: Diagnosis not present

## 2016-04-04 DIAGNOSIS — L820 Inflamed seborrheic keratosis: Secondary | ICD-10-CM | POA: Diagnosis not present

## 2016-04-04 DIAGNOSIS — L812 Freckles: Secondary | ICD-10-CM | POA: Diagnosis not present

## 2016-04-04 DIAGNOSIS — D485 Neoplasm of uncertain behavior of skin: Secondary | ICD-10-CM | POA: Diagnosis not present

## 2016-04-04 DIAGNOSIS — D1800 Hemangioma unspecified site: Secondary | ICD-10-CM | POA: Diagnosis not present

## 2016-04-10 ENCOUNTER — Ambulatory Visit (INDEPENDENT_AMBULATORY_CARE_PROVIDER_SITE_OTHER): Payer: PPO | Admitting: Family Medicine

## 2016-04-10 ENCOUNTER — Encounter: Payer: Self-pay | Admitting: Family Medicine

## 2016-04-10 VITALS — BP 112/68 | HR 64 | Temp 97.5°F | Resp 16 | Wt 272.0 lb

## 2016-04-10 DIAGNOSIS — E119 Type 2 diabetes mellitus without complications: Secondary | ICD-10-CM

## 2016-04-10 DIAGNOSIS — I10 Essential (primary) hypertension: Secondary | ICD-10-CM | POA: Diagnosis not present

## 2016-04-10 DIAGNOSIS — E785 Hyperlipidemia, unspecified: Secondary | ICD-10-CM

## 2016-04-10 DIAGNOSIS — E039 Hypothyroidism, unspecified: Secondary | ICD-10-CM | POA: Diagnosis not present

## 2016-04-10 LAB — POCT GLYCOSYLATED HEMOGLOBIN (HGB A1C): HEMOGLOBIN A1C: 5.8

## 2016-04-10 NOTE — Progress Notes (Signed)
Patient: Jared Rice Male    DOB: 1932-08-10   82 y.o.   MRN: [id number] Visit Date: 04/10/2016  Today's Provider: Wilhemena Durie, MD   Chief Complaint  Patient presents with  . Hypertension  . Diabetes  . Hypothyroidism   Subjective:    HPI   Diabetes Mellitus Type II, Follow-up:   Lab Results  Component Value Date   HGBA1C 5.8 04/10/2016   HGBA1C 6.1 (H) 10/03/2015   HGBA1C 6.4 (H) 11/01/2014    Last seen for diabetes 4 months ago.  Management since then includes none. He reports good compliance with treatment. He is not having side effects.  Home blood sugar records: not being checked  Pertinent Labs:    Component Value Date/Time   CHOL 108 10/03/2015 0845   TRIG 38 10/03/2015 0845   HDL 40 10/03/2015 0845   LDLCALC 60 10/03/2015 0845   CREATININE 0.90 10/03/2015 0845   CREATININE 0.87 10/13/2013 1055    Wt Readings from Last 3 Encounters:  04/10/16 272 lb (123.4 kg)  02/23/16 266 lb (120.7 kg)  01/23/16 273 lb (123.8 kg)    ------------------------------------------------------------------------     Hypertension, follow-up:  BP Readings from Last 3 Encounters:  04/10/16 112/68  02/23/16 138/76  01/23/16 110/62    He was last seen for hypertension 3 months ago.  BP at that visit was 110/62. Management changes since that visit include continue medications. He reports good compliance with treatment. He is not having side effects.  He is not exercising. He is adherent to low salt diet.   Outside blood pressures are not being checked. He is experiencing none.  Patient denies chest pain, irregular heart beat and palpitations.   Cardiovascular risk factors include advanced age (older than 69 for men, 54 for women), dyslipidemia, hypertension and male gender.  Use of agents associated with hypertension: none.     Weight trend: stable Wt Readings from Last 3 Encounters:  04/10/16 272 lb (123.4 kg)  02/23/16 266 lb (120.7 kg)  01/23/16  273 lb (123.8 kg)   ------------------------------------------------------------------------  Hypothyroid, follow-up  TSH  Date Value Ref Range Status  10/03/2015 3.310 0.450 - 4.500 uIU/mL Final  11/01/2014 5.580 (H) 0.450 - 4.500 uIU/mL Final   Wt Readings from Last 3 Encounters:  04/10/16 272 lb (123.4 kg)  02/23/16 266 lb (120.7 kg)  01/23/16 273 lb (123.8 kg)    He was last seen for hypothyroid 3 months ago.  Management since that visit includes continue Levothyroxine. He reports good compliance with treatment. He is not having side effects.  He is not exercising. He is experiencing none ------------------------------------------------------------------------   Lipid/Cholesterol, Follow-up:   Last seen for this 3 months ago.  Management since that visit includes continue Crestor.  Last Lipid Panel:    Component Value Date/Time   CHOL 108 10/03/2015 0845   TRIG 38 10/03/2015 0845   HDL 40 10/03/2015 0845   LDLCALC 60 10/03/2015 0845    He reports good compliance with treatment. He is not having side effects.   Wt Readings from Last 3 Encounters:  04/10/16 272 lb (123.4 kg)  02/23/16 266 lb (120.7 kg)  01/23/16 273 lb (123.8 kg)   ------------------------------------------------------------------------    Previous Medications   ASPIRIN 81 MG TABLET    Take 81 mg by mouth daily.   CRESTOR 20 MG TABLET    Take 1 tablet by mouth every day   DIPHENHYDRAMINE-ACETAMINOPHEN (TYLENOL PM) 25-500 MG TABS    Take  2 tablets by mouth at bedtime as needed.   DOXAZOSIN (CARDURA) 8 MG TABLET    Take 1 tablet by mouth once daily   FUROSEMIDE (LASIX) 20 MG TABLET    Take 1 tablet by mouth daily in the morning   LEVOTHYROXINE (SYNTHROID, LEVOTHROID) 75 MCG TABLET    Take 1 tablet by mouth daily in the morning   PANTOPRAZOLE (PROTONIX) 40 MG TABLET    Take 1 tablet (40 mg total) by mouth daily before breakfast.   QUINAPRIL (ACCUPRIL) 40 MG TABLET    Take 1 tablet by mouth  daily   TOPIRAMATE (TOPAMAX) 50 MG TABLET    Take 1 tablet (50 mg total) by mouth 2 (two) times daily.    Review of Systems  Constitutional: Negative.   HENT: Negative.   Eyes: Negative.   Respiratory: Negative.   Cardiovascular: Negative.   Gastrointestinal: Negative.   Endocrine: Negative.   Genitourinary: Negative.   Musculoskeletal: Negative.   Skin: Negative.   Allergic/Immunologic: Negative.   Neurological: Negative.   Hematological: Negative.   Psychiatric/Behavioral: Negative.     Social History  Substance Use Topics  . Smoking status: Former Smoker    Packs/day: 2.00    Years: 31.00    Types: Cigarettes    Quit date: 12/24/1980  . Smokeless tobacco: Never Used     Comment: quit in 1982  . Alcohol use No     Comment: occasionally   Objective:   BP 112/68 (BP Location: Left Arm, Patient Position: Sitting, Cuff Size: Large)   Pulse 64   Temp 97.5 F (36.4 C) (Oral)   Resp 16   Wt 272 lb (123.4 kg)   BMI 34.92 kg/m   Physical Exam  Constitutional: He is oriented to person, place, and time. He appears well-developed and well-nourished.  HENT:  Head: Normocephalic and atraumatic.  Eyes: Conjunctivae and EOM are normal. Pupils are equal, round, and reactive to light.  Neck: Normal range of motion. Neck supple.  Cardiovascular: Normal rate, regular rhythm, normal heart sounds and intact distal pulses.   Pulmonary/Chest: Effort normal and breath sounds normal.  Abdominal: Soft.  Musculoskeletal: Normal range of motion. He exhibits edema (1+).  Neurological: He is alert and oriented to person, place, and time. He has normal reflexes.  Skin: Skin is warm and dry.  Psychiatric: He has a normal mood and affect. His behavior is normal. Judgment and thought content normal.        Assessment & Plan:     1. Type 2 diabetes mellitus without complication, without long-term current use of insulin (HCC) - POCT HgB A1C 5.8 today. Improved. Follow up 4 months  2.  Essential hypertension Stable.   3. Hyperlipidemia, unspecified hyperlipidemia type   4. Adult hypothyroidism   HPI, Exam, and A&P Transcribed under the direction and in the presence of Jaideep Pollack L. Cranford Mon, MD  Electronically Signed: Webb Laws, CMA  Follow up: Return in about 4 months (around 08/08/2016).     I have done the exam and reviewed the chart and it is accurate to the best of my knowledge. Development worker, community has been used and  any errors in dictation or transcription are unintentional. [name] M.D. [facility]

## 2016-04-29 DIAGNOSIS — B351 Tinea unguium: Secondary | ICD-10-CM | POA: Diagnosis not present

## 2016-04-29 DIAGNOSIS — M79675 Pain in left toe(s): Secondary | ICD-10-CM | POA: Diagnosis not present

## 2016-04-29 DIAGNOSIS — M2041 Other hammer toe(s) (acquired), right foot: Secondary | ICD-10-CM | POA: Diagnosis not present

## 2016-04-29 DIAGNOSIS — M79674 Pain in right toe(s): Secondary | ICD-10-CM | POA: Diagnosis not present

## 2016-05-14 ENCOUNTER — Encounter: Payer: Self-pay | Admitting: Family Medicine

## 2016-05-14 ENCOUNTER — Ambulatory Visit (INDEPENDENT_AMBULATORY_CARE_PROVIDER_SITE_OTHER): Payer: PPO | Admitting: Family Medicine

## 2016-05-14 VITALS — BP 164/70 | HR 64 | Temp 97.6°F | Resp 14 | Ht 74.0 in | Wt 281.0 lb

## 2016-05-14 DIAGNOSIS — Z0000 Encounter for general adult medical examination without abnormal findings: Secondary | ICD-10-CM

## 2016-05-14 NOTE — Progress Notes (Signed)
Patient: Jared Rice, Male    DOB: February 28, 1933, 82 y.o.   MRN: [id number] Visit Date: 05/14/2016  Today's Provider: Wilhemena Durie, MD   Chief Complaint  Patient presents with  . Medicare Wellness   Subjective:   Jared Rice is a 82 y.o. male who presents today for his Subsequent Annual Wellness Visit. He feels well. He reports is not exercising. He reports he is sleeping well. Pt has OSA but does not tolerate his CPAP. Colonoscopy- 03/17/12 Diverticulosis Endo- 12/26/14  Immunization History  Administered Date(s) Administered  . Influenza, High Dose Seasonal PF 02/18/2015, 02/29/2016  . Influenza-Unspecified 02/24/2013  . Pneumococcal Conjugate-13 04/14/2014  . Pneumococcal Polysaccharide-23 03/25/1997  . Td 08/29/2003  . Tdap 02/20/2011  . Zoster 05/12/2007     Review of Systems  Constitutional: Negative.   HENT: Negative.   Eyes: Negative.   Respiratory: Negative.   Cardiovascular: Negative.   Gastrointestinal: Negative.   Endocrine: Negative.   Genitourinary: Negative.   Musculoskeletal: Negative.   Skin: Negative.   Allergic/Immunologic: Negative.   Neurological: Positive for tremors.  Hematological: Bruises/bleeds easily.  Psychiatric/Behavioral: Negative.     Patient Active Problem List   Diagnosis Date Noted  . Chronic fatigue 11/29/2015  . BPH (benign prostatic hypertrophy) 02/22/2015  . Gastrointestinal ulcer   . History of peptic ulcer   . Gastritis   . Hematochezia   . Acute gastric ulcer   . Melena 11/12/2014  . Malignant neoplasm of skin 09/21/2014  . Cataract 09/21/2014  . Colon polyp 09/21/2014  . CAFL (chronic airflow limitation) ([place]) 09/21/2014  . Diabetes ([place]) 09/21/2014  . DD (diverticular disease) 09/21/2014  . Benign essential tremor 09/21/2014  . Personal history of tobacco use, presenting hazards to health 09/21/2014  . Acid reflux 09/21/2014  . Hemorrhoids 09/21/2014  . 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 HLD  (hyperlipidemia) 09/21/2014  . BP (high blood pressure) 09/21/2014  . Adult hypothyroidism 09/21/2014  . Lymphoma ([place]) 09/21/2014  . Malaise and fatigue 09/21/2014  . Mononeuritis 09/21/2014  . Muscle ache 09/21/2014  . Neuropathy ([place]) 09/21/2014  . Numbness and tingling 09/21/2014  . Arthritis, degenerative 09/21/2014  . Adiposity 09/21/2014  . Awareness of heartbeats 09/21/2014  . Borderline diabetes 09/21/2014  . Acne erythematosa 09/21/2014  . Rotator cuff syndrome 09/21/2014  . Athlete's foot 09/21/2014  . Has a tremor 09/21/2014  . Stasis, venous 09/21/2014  . Avitaminosis D 09/21/2014  . Arthritis of knee, degenerative 11/16/2013  . Bradycardia, sinus 12/31/2011  . Left bundle branch block 12/31/2011  . First degree AV block 12/31/2011  . Edema of both legs 12/31/2011  . COPD UNSPECIFIED 11/09/2009  . HYPERTENSION, BENIGN 10/03/2009  . SLEEP APNEA 10/03/2009  . DYSPNEA 10/03/2009  . CAD, NATIVE VESSEL 05/04/2009  . Carotid disease, bilateral ([place]) 05/04/2009    Social History   Social History  . Marital status: Married    Spouse name: N/A  . Number of children: 4  . Years of education: N/A   Occupational History  . retired     [employer]   Social History Main Topics  . Smoking status: Former Smoker    Packs/day: 2.00    Years: 31.00    Types: Cigarettes    Quit date: 12/24/1980  . Smokeless tobacco: Never Used     Comment: quit in 1982  . Alcohol use No     Comment: occasionally  . Drug use: No  . Sexual activity: Not on file   Other  Topics Concern  . Not on file   Social History Narrative   Lives with wife, retired Engineer, drilling, 2 children, 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    Past Surgical History:  Procedure Laterality Date  . CARDIAC CATHETERIZATION    . COLONOSCOPY  03/17/12   Dr Byrnett-diverticulosis  . ESOPHAGOGASTRODUODENOSCOPY (EGD) WITH PROPOFOL N/A 11/25/2014   Procedure: ESOPHAGOGASTRODUODENOSCOPY (EGD) WITH  PROPOFOL;  Surgeon: Lucilla Lame, MD;  Location: [facility];  Service: Endoscopy;  Laterality: N/A;  with biopsy  . ESOPHAGOGASTRODUODENOSCOPY (EGD) WITH PROPOFOL N/A 01/05/2015   Procedure: ESOPHAGOGASTRODUODENOSCOPY (EGD) WITH PROPOFOL;  Surgeon: Lucilla Lame, MD;  [facility];  Service: Endoscopy;  Laterality: N/A;  . EYE SURGERY Bilateral    cataract   . TOTAL KNEE ARTHROPLASTY Left     His family history includes Alcohol abuse in his father; Alzheimer's disease (age of onset: 19) in his brother; Alzheimer's disease (age of onset: 55) in his sister; Heart attack (age of onset: 97) in his father; Heart failure (age of onset: 76) in his mother.     Outpatient Medications Prior to Visit  Medication Sig Dispense Refill  . aspirin 81 MG tablet Take 81 mg by mouth daily.    . CRESTOR 20 MG tablet Take 1 tablet by mouth every day 90 tablet 1  . diphenhydramine-acetaminophen (TYLENOL PM) 25-500 MG TABS Take 2 tablets by mouth at bedtime as needed.    . doxazosin (CARDURA) 8 MG tablet Take 1 tablet by mouth once daily 90 tablet 3  . furosemide (LASIX) 20 MG tablet Take 1 tablet by mouth daily in the morning 90 tablet 2  . levothyroxine (SYNTHROID, LEVOTHROID) 75 MCG tablet Take 1 tablet by mouth daily in the morning 90 tablet 3  . pantoprazole (PROTONIX) 40 MG tablet Take 1 tablet (40 mg total) by mouth daily before breakfast. 30 tablet 11  . quinapril (ACCUPRIL) 40 MG tablet Take 1 tablet by mouth daily 90 tablet 3  . topiramate (TOPAMAX) 50 MG tablet Take 1 tablet (50 mg total) by mouth 2 (two) times daily. 60 tablet 5   No facility-administered medications prior to visit.     Allergies  Allergen Reactions  . Dexilant [Dexlansoprazole] Rash  . Primidone Itching and Rash    rash    Patient Care Team: Jerrol Banana., MD as PCP - General (Unknown Physician Specialty)   Objective:   Vitals:  Vitals:   05/14/16 1022  BP: (!) 164/70  Pulse: 64  Resp: 14   Temp: 97.6 F (36.4 C)  TempSrc: Oral  Weight: 281 lb (127.5 kg)  Height: 6\' 2"  (1.88 m)    Physical Exam  Constitutional: He is oriented to person, place, and time. He appears well-developed and well-nourished.  HENT:  Head: Normocephalic and atraumatic.  Right Ear: External ear normal.  Left Ear: External ear normal.  Nose: Nose normal.  Mouth/Throat: Oropharynx is clear and moist.  Eyes: Conjunctivae and EOM are normal. Pupils are equal, round, and reactive to light.  Neck: Normal range of motion. Neck supple.  Cardiovascular: Normal rate, regular rhythm and intact distal pulses.   Murmur heard. 2/6 systolic murmur  Pulmonary/Chest: Effort normal and breath sounds normal.  Abdominal: Soft. Bowel sounds are normal.  Genitourinary: Rectum normal, prostate normal and penis normal.  Musculoskeletal: Normal range of motion. He exhibits edema.  1+ edema.  Neurological: He is alert and oriented to person, place, and time. He has normal reflexes.  Skin: Skin is warm and dry.  Psychiatric: He has a normal mood and affect. His behavior is normal. Judgment and thought content normal.    Activities of Daily Living In your present state of health, do you have any difficulty performing the following activities: 05/14/2016  Hearing? N  Vision? N  Difficulty concentrating or making decisions? N  Walking or climbing stairs? N  Dressing or bathing? N  Doing errands, shopping? N  Some recent data might be hidden    Fall Risk Assessment Fall Risk  05/14/2016 05/10/2015  Falls in the past year? No No     Depression Screen PHQ 2/9 Scores 05/14/2016 05/10/2015  PHQ - 2 Score 0 0    Cognitive Testing - 6-CIT   Year: 0 points  Month: 0 points  Memorize "Pia Mau, [address] [place]"  Time (within 1 hour:) 0 points  Count backwards from 20: 0 points  Name months of year: 0 points  Repeat Address: 0 points   Total Score: 0/28  Interpretation : Normal (0-7) Abnormal  (8-28)    Assessment & Plan:     Annual Wellness Visit  Reviewed patient's Family Medical History Reviewed and updated list of patient's medical providers Assessment of cognitive impairment was done Assessed patient's functional ability Established a written schedule for health screening [place] Completed and Reviewed  Exercise Activities and Dietary recommendations Goals    None      Immunization History  Administered Date(s) Administered  . Influenza, High Dose Seasonal PF 02/18/2015, 02/29/2016  . Influenza-Unspecified 02/24/2013  . Pneumococcal Conjugate-13 04/14/2014  . Pneumococcal Polysaccharide-23 03/25/1997  . Td 08/29/2003  . Tdap 02/20/2011  . Zoster 05/12/2007    Health Maintenance  Topic Date Due  . FOOT EXAM  11/06/1944  . OPHTHALMOLOGY EXAM  11/06/1944  . PNA vac Low Risk Adult (2 of 2 - PPSV23) 04/15/2015  . HEMOGLOBIN A1C  10/08/2016  . TETANUS/TDAP  02/19/2021  . INFLUENZA VACCINE  Completed  . ZOSTAVAX  Completed     Discussed health benefits of physical activity, and encouraged him to engage in regular exercise appropriate for his age and condition.    [name] MD [facility] 05/14/2016 10:32 AM  ------------------------------------------------------------------------------------------------------------

## 2016-06-03 ENCOUNTER — Other Ambulatory Visit: Payer: Self-pay | Admitting: Family Medicine

## 2016-07-02 ENCOUNTER — Ambulatory Visit (INDEPENDENT_AMBULATORY_CARE_PROVIDER_SITE_OTHER): Payer: PPO | Admitting: Family Medicine

## 2016-07-02 VITALS — BP 108/62 | HR 62 | Temp 98.3°F | Resp 16 | Wt 280.0 lb

## 2016-07-02 DIAGNOSIS — R300 Dysuria: Secondary | ICD-10-CM

## 2016-07-02 DIAGNOSIS — E039 Hypothyroidism, unspecified: Secondary | ICD-10-CM | POA: Diagnosis not present

## 2016-07-02 DIAGNOSIS — Z87440 Personal history of urinary (tract) infections: Secondary | ICD-10-CM | POA: Diagnosis not present

## 2016-07-02 DIAGNOSIS — I10 Essential (primary) hypertension: Secondary | ICD-10-CM | POA: Diagnosis not present

## 2016-07-02 DIAGNOSIS — Z6835 Body mass index (BMI) 35.0-35.9, adult: Secondary | ICD-10-CM

## 2016-07-02 LAB — POCT UA - MICROSCOPIC ONLY

## 2016-07-02 LAB — POCT URINALYSIS DIPSTICK
Bilirubin, UA: NEGATIVE
Glucose, UA: NEGATIVE
Ketones, UA: NEGATIVE
Nitrite, UA: NEGATIVE
Spec Grav, UA: 1.015
UROBILINOGEN UA: 0.2
pH, UA: 5

## 2016-07-02 MED ORDER — SULFAMETHOXAZOLE-TRIMETHOPRIM 800-160 MG PO TABS: 1.0000 | ORAL_TABLET | Freq: Two times a day (BID) | ORAL | 1 refills | Status: DC

## 2016-07-02 NOTE — Progress Notes (Signed)
Jared Rice  MRN: [id number] DOB: 12/31/1932  Subjective:  HPI  Patient is here stating that he has had urinary issues for the past 3 days. He is having burning at the end of urination, frequency, urgency, possibly some chills. Denies fever, nausea, back or groin pain. He has history of recurrent UTIs, he has Cipro on hand but has not started taking this at this time.  Patient Active Problem List   Diagnosis Date Noted  . Chronic fatigue 11/29/2015  . BPH (benign prostatic hypertrophy) 02/22/2015  . Gastrointestinal ulcer   . History of peptic ulcer   . Gastritis   . Hematochezia   . Acute gastric ulcer   . Melena 11/12/2014  . Malignant neoplasm of skin 09/21/2014  . Cataract 09/21/2014  . Colon polyp 09/21/2014  . CAFL (chronic airflow limitation) ([place]) 09/21/2014  . Diabetes ([place]) 09/21/2014  . DD (diverticular disease) 09/21/2014  . Benign essential tremor 09/21/2014  . Personal history of tobacco use, presenting hazards to health 09/21/2014  . Acid reflux 09/21/2014  . Hemorrhoids 09/21/2014  . 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 HLD (hyperlipidemia) 09/21/2014  . BP (high blood pressure) 09/21/2014  . Adult hypothyroidism 09/21/2014  . Lymphoma ([place]) 09/21/2014  . Malaise and fatigue 09/21/2014  . Mononeuritis 09/21/2014  . Muscle ache 09/21/2014  . Neuropathy ([place]) 09/21/2014  . Numbness and tingling 09/21/2014  . Arthritis, degenerative 09/21/2014  . Adiposity 09/21/2014  . Awareness of heartbeats 09/21/2014  . Borderline diabetes 09/21/2014  . Acne erythematosa 09/21/2014  . Rotator cuff syndrome 09/21/2014  . Athlete's foot 09/21/2014  . Has a tremor 09/21/2014  . Stasis, venous 09/21/2014  . Avitaminosis D 09/21/2014  . Arthritis of knee, degenerative 11/16/2013  . Bradycardia, sinus 12/31/2011  . Left bundle branch block 12/31/2011  . First degree AV block 12/31/2011  . Edema of both legs 12/31/2011  . COPD UNSPECIFIED 11/09/2009  . HYPERTENSION,  BENIGN 10/03/2009  . SLEEP APNEA 10/03/2009  . DYSPNEA 10/03/2009  . CAD, NATIVE VESSEL 05/04/2009  . Carotid disease, bilateral ([place]) 05/04/2009    Past Medical History:  Diagnosis Date  . Anginal pain ([place])   . Chronic airway obstruction, not elsewhere classified   . Colon polyps   . Coronary atherosclerosis of native coronary artery    nonobstructive  . Essential hypertension, benign   . Glucose intolerance (impaired glucose tolerance)   . Hypothyroidism   . Iron deficiency anemia   . Knee pain, right   . Occlusion and stenosis of carotid artery without mention of cerebral infarction    wears compression stockings  . Other dyspnea and respiratory abnormality    w/ pseuodowheeze resolves with purse lip manuever  . Precordial pain   . Shoulder pain, right   . Tremor   . Unspecified sleep apnea   . Venous insufficiency   . Wears dentures    full upper    Social History   Social History  . Marital status: Married    Spouse name: N/A  . Number of children: 4  . Years of education: N/A   Occupational History  . retired     [employer]   Social History Main Topics  . Smoking status: Former Smoker    Packs/day: 2.00    Years: 31.00    Types: Cigarettes    Quit date: 12/24/1980  . Smokeless tobacco: Never Used     Comment: quit in 1982  . Alcohol use No     Comment:  occasionally  . Drug use: No  . Sexual activity: Not on file   Other Topics Concern  . Not on file   Social History Narrative   Lives with wife, retired [employer], 2 children, 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    Outpatient Encounter Prescriptions as of 07/02/2016  Medication Sig  . aspirin 81 MG tablet Take 81 mg by mouth daily.  . CRESTOR 20 MG tablet Take 1 tablet by mouth every day  . diphenhydramine-acetaminophen (TYLENOL PM) 25-500 MG TABS Take 2 tablets by mouth at bedtime as needed.  . doxazosin (CARDURA) 8 MG tablet Take 1 tablet by mouth once daily  . furosemide (LASIX)  20 MG tablet Take 1 tablet by mouth daily in the morning  . levothyroxine (SYNTHROID, LEVOTHROID) 75 MCG tablet Take 1 tablet by mouth daily in the morning  . pantoprazole (PROTONIX) 40 MG tablet Take 1 tablet (40 mg total) by mouth daily before breakfast.  . quinapril (ACCUPRIL) 40 MG tablet Take 1 tablet by mouth daily  . topiramate (TOPAMAX) 50 MG tablet TAKE ONE TABLET BY MOUTH TWICE DAILY   No facility-administered encounter medications on file as of 07/02/2016.     Allergies  Allergen Reactions  . Dexilant [Dexlansoprazole] Rash  . Primidone Itching and Rash    rash    Review of Systems  Constitutional: Positive for malaise/fatigue.  Respiratory: Positive for shortness of breath.   Cardiovascular: Negative.   Gastrointestinal: Negative.   Genitourinary: Positive for dysuria, frequency and urgency.    Objective:  BP 108/62   Pulse 62   Temp 98.3 F (36.8 C)   Resp 16   Wt 280 lb (127 kg)   BMI 35.95 kg/m   Physical Exam  Constitutional: He is oriented to person, place, and time and well-developed, well-nourished, and in no distress.  Eyes: Conjunctivae are normal. Pupils are equal, round, and reactive to light.  Neck: Normal range of motion. Neck supple.  Cardiovascular: Normal rate, regular rhythm, normal heart sounds and intact distal pulses.   Pulmonary/Chest: Effort normal and breath sounds normal. No respiratory distress.  Abdominal: He exhibits no distension. There is no tenderness.  Musculoskeletal: He exhibits no edema or tenderness.  Neurological: He is alert and oriented to person, place, and time.    Assessment and Plan :  1. Burning with urination Will treat with Septra DS. Push fluids. Urine culture sent off to confirm infection and make sure antibiotic is the right one for this issue at this time. This could be a prostate infection. Will follow. Advised patient if he starts running high fevers to let us know. - POCT Urinalysis Dipstick - Urine  Culture - POCT UA - Microscopic Only  2. History of recurrent UTIs/Prostatitis  3. Essential hypertension 4. Adult hypothyroidism  5. BMI 35.0-35.9,adult  /HPI, Exam and A&P transcribed under direction and in the presence of [name], MD. I have done the exam and reviewed the chart and it is accurate to the best of my knowledge. Development worker, community has been used and  any errors in dictation or transcription are unintentional. [name] M.D. [facility]

## 2016-07-03 DIAGNOSIS — L570 Actinic keratosis: Secondary | ICD-10-CM | POA: Diagnosis not present

## 2016-07-03 DIAGNOSIS — L578 Other skin changes due to chronic exposure to nonionizing radiation: Secondary | ICD-10-CM | POA: Diagnosis not present

## 2016-07-04 LAB — URINE CULTURE: Organism ID, Bacteria: NO GROWTH

## 2016-07-10 ENCOUNTER — Ambulatory Visit (INDEPENDENT_AMBULATORY_CARE_PROVIDER_SITE_OTHER): Payer: PPO | Admitting: Family Medicine

## 2016-07-10 ENCOUNTER — Encounter: Payer: Self-pay | Admitting: Family Medicine

## 2016-07-10 VITALS — BP 122/60 | HR 64 | Temp 97.9°F | Resp 16 | Wt 278.0 lb

## 2016-07-10 DIAGNOSIS — S51002A Unspecified open wound of left elbow, initial encounter: Secondary | ICD-10-CM | POA: Diagnosis not present

## 2016-07-10 DIAGNOSIS — W19XXXA Unspecified fall, initial encounter: Secondary | ICD-10-CM | POA: Diagnosis not present

## 2016-07-10 NOTE — Progress Notes (Signed)
Patient: Jared Rice Male    DOB: [birth year]   83 y.o.   MRN: [id number] Visit Date: 07/10/2016  Today's Provider: Wilhemena Durie, MD   Chief Complaint  Patient presents with  . Elbow Injury   Subjective:    Arm Injury   The incident occurred 12 to 24 hours ago (yesterday at 4:30 pm). The incident occurred at home (pt tripped over shoelaces). The injury mechanism was a fall. The pain is present in the left elbow. The patient is experiencing no pain. Pertinent negatives include no numbness or tingling. Treatments tried: Neosporin, bactine and wrapped in an ace bandage.       Allergies  Allergen Reactions  . Dexilant [Dexlansoprazole] Rash  . Primidone Itching and Rash    rash     Current Outpatient Prescriptions:  .  aspirin 81 MG tablet, Take 81 mg by mouth daily., Disp: , Rfl:  .  CRESTOR 20 MG tablet, Take 1 tablet by mouth every day, Disp: 90 tablet, Rfl: 1 .  diphenhydramine-acetaminophen (TYLENOL PM) 25-500 MG TABS, Take 2 tablets by mouth at bedtime as needed., Disp: , Rfl:  .  doxazosin (CARDURA) 8 MG tablet, Take 1 tablet by mouth once daily, Disp: 90 tablet, Rfl: 3 .  furosemide (LASIX) 20 MG tablet, Take 1 tablet by mouth daily in the morning, Disp: 90 tablet, Rfl: 2 .  levothyroxine (SYNTHROID, LEVOTHROID) 75 MCG tablet, Take 1 tablet by mouth daily in the morning, Disp: 90 tablet, Rfl: 3 .  pantoprazole (PROTONIX) 40 MG tablet, Take 1 tablet (40 mg total) by mouth daily before breakfast., Disp: 30 tablet, Rfl: 11 .  quinapril (ACCUPRIL) 40 MG tablet, Take 1 tablet by mouth daily, Disp: 90 tablet, Rfl: 3 .  topiramate (TOPAMAX) 50 MG tablet, TAKE ONE TABLET BY MOUTH TWICE DAILY, Disp: 60 tablet, Rfl: 5  Review of Systems  Constitutional: Negative for activity change, appetite change, chills, diaphoresis, fatigue, fever and unexpected weight change.  Respiratory: Negative.   Cardiovascular: Negative.   Skin: Positive for wound.  Neurological:  Negative for dizziness, tingling, syncope, light-headedness and numbness.    Social History  Substance Use Topics  . Smoking status: Former Smoker    Packs/day: 2.00    Years: 31.00    Types: Cigarettes    Quit date: 12/24/1980  . Smokeless tobacco: Never Used     Comment: quit in 1982  . Alcohol use No     Comment: occasionally   Objective:   BP 122/60 (BP Location: Right Arm, Patient Position: Sitting, Cuff Size: Large)   Pulse 64   Temp 97.9 F (36.6 C) (Oral)   Resp 16   Wt 278 lb (126.1 kg)   BMI 35.69 kg/m   Physical Exam  Constitutional: He is oriented to person, place, and time. He appears well-developed and well-nourished.  HENT:  Head: Normocephalic and atraumatic.  Eyes: Conjunctivae are normal. No scleral icterus.  Neck: No thyromegaly present.  Cardiovascular: Normal rate, regular rhythm and normal heart sounds.   Pulmonary/Chest: Effort normal and breath sounds normal.  Abdominal: Soft.  Musculoskeletal:  FROM of left elbow.  Neurological: He is alert and oriented to person, place, and time.  Skin: Skin is warm and dry.  6 by 4 inch area of left forearm where skin is peeled back from fall.  Psychiatric: He has a normal mood and affect. His behavior is normal. Judgment and thought content normal.  Assessment & Plan:     1. Open wound of left elbow, initial encounter Cleaned and dressed today. Well healing. Recheck if needed.   2. Fall, initial encounter Due to tripping on shoelaces. Pt denies dizziness, lightheadedness or syncopal episodes.     Patient seen and examined by [name], MD, and note scribed by [name], CMA. I have done the exam and reviewed the above chart and it is accurate to the best of my knowledge. Development worker, community has been used in this note in any air is in the dictation or transcription are unintentional.  Wilhemena Durie, MD  [facility]

## 2016-07-13 ENCOUNTER — Encounter: Payer: Self-pay | Admitting: Physician Assistant

## 2016-07-13 ENCOUNTER — Ambulatory Visit (INDEPENDENT_AMBULATORY_CARE_PROVIDER_SITE_OTHER): Payer: PPO | Admitting: Physician Assistant

## 2016-07-13 VITALS — BP 100/54 | HR 64 | Temp 98.6°F | Resp 16 | Wt 275.0 lb

## 2016-07-13 DIAGNOSIS — J40 Bronchitis, not specified as acute or chronic: Secondary | ICD-10-CM

## 2016-07-13 MED ORDER — PREDNISONE 10 MG (21) PO TBPK: ORAL_TABLET | ORAL | 1 refills | Status: DC

## 2016-07-13 MED ORDER — AZITHROMYCIN 250 MG PO TABS: ORAL_TABLET | ORAL | 0 refills | Status: DC

## 2016-07-13 NOTE — Progress Notes (Signed)
Patient: Jared Rice Male    DOB: June 15, 1932   83 y.o.   MRN: [id number] Visit Date: 07/13/2016  Today's Provider: Mar Daring, PA-C   Chief Complaint  Patient presents with  . Cough   Subjective:    Cough and mild sore throat started 2 days ago. Cough is productive with slight wheezing.  No fever. Patient has not taken any medication for his symptoms.    Cough  This is a new problem. The current episode started in the past 7 days (2 days ago). The problem has been gradually worsening. The cough is productive of sputum. Associated symptoms include postnasal drip, a sore throat and wheezing. Pertinent negatives include no chest pain, chills, ear congestion, ear pain, fever, headaches, heartburn, hemoptysis, myalgias, nasal congestion, rash, rhinorrhea, shortness of breath, sweats or weight loss. Nothing aggravates the symptoms. He has tried nothing for the symptoms. His past medical history is significant for bronchitis and pneumonia. There is no history of asthma, bronchiectasis, COPD or emphysema.  Has had bronchitis multiple times in the past.      Allergies  Allergen Reactions  . Dexilant [Dexlansoprazole] Rash  . Primidone Itching and Rash    rash     Current Outpatient Prescriptions:  .  aspirin 81 MG tablet, Take 81 mg by mouth daily., Disp: , Rfl:  .  CRESTOR 20 MG tablet, Take 1 tablet by mouth every day, Disp: 90 tablet, Rfl: 1 .  diphenhydramine-acetaminophen (TYLENOL PM) 25-500 MG TABS, Take 2 tablets by mouth at bedtime as needed., Disp: , Rfl:  .  doxazosin (CARDURA) 8 MG tablet, Take 1 tablet by mouth once daily, Disp: 90 tablet, Rfl: 3 .  furosemide (LASIX) 20 MG tablet, Take 1 tablet by mouth daily in the morning, Disp: 90 tablet, Rfl: 2 .  levothyroxine (SYNTHROID, LEVOTHROID) 75 MCG tablet, Take 1 tablet by mouth daily in the morning, Disp: 90 tablet, Rfl: 3 .  pantoprazole (PROTONIX) 40 MG tablet, Take 1 tablet (40 mg total) by mouth daily  before breakfast., Disp: 30 tablet, Rfl: 11 .  quinapril (ACCUPRIL) 40 MG tablet, Take 1 tablet by mouth daily, Disp: 90 tablet, Rfl: 3 .  topiramate (TOPAMAX) 50 MG tablet, TAKE ONE TABLET BY MOUTH TWICE DAILY, Disp: 60 tablet, Rfl: 5  Review of Systems  Constitutional: Negative for appetite change, chills, fever and weight loss.  HENT: Positive for congestion, postnasal drip and sore throat. Negative for ear pain, rhinorrhea, sinus pain and sinus pressure.   Respiratory: Positive for cough and wheezing. Negative for hemoptysis, chest tightness and shortness of breath.   Cardiovascular: Negative for chest pain and palpitations.  Gastrointestinal: Negative for abdominal pain, heartburn, nausea and vomiting.  Musculoskeletal: Negative for myalgias.  Skin: Negative for rash.  Neurological: Negative for dizziness, light-headedness and headaches.    Social History  Substance Use Topics  . Smoking status: Former Smoker    Packs/day: 2.00    Years: 31.00    Types: Cigarettes    Quit date: 12/24/1980  . Smokeless tobacco: Never Used     Comment: quit in 1982  . Alcohol use No   Objective:   BP (!) 100/54 (BP Location: Right Arm, Patient Position: Sitting, Cuff Size: Large)   Pulse 64   Temp 98.6 F (37 C) (Oral)   Resp 16   Wt 275 lb (124.7 kg)   SpO2 95%   BMI 35.31 kg/m   Physical Exam  Constitutional: He appears  well-developed and well-nourished. No distress.  HENT:  Head: Normocephalic and atraumatic.  Right Ear: Hearing, tympanic membrane, external ear and ear canal normal. Tympanic membrane is not erythematous and not bulging. No middle ear effusion.  Left Ear: Hearing, tympanic membrane, external ear and ear canal normal. Tympanic membrane is not erythematous and not bulging.  No middle ear effusion.  Nose: Mucosal edema and rhinorrhea present. Right sinus exhibits no maxillary sinus tenderness and no frontal sinus tenderness. Left sinus exhibits no maxillary sinus  tenderness and no frontal sinus tenderness.  Mouth/Throat: Uvula is midline, oropharynx is clear and moist and mucous membranes are normal. No oropharyngeal exudate, posterior oropharyngeal edema or posterior oropharyngeal erythema.  Eyes: Conjunctivae and EOM are normal. Pupils are equal, round, and reactive to light. Right eye exhibits no discharge. Left eye exhibits no discharge.  Neck: Normal range of motion. Neck supple. No tracheal deviation present. No Brudzinski's sign and no Kernig's sign noted. No thyromegaly present.  Cardiovascular: Normal rate, regular rhythm and normal heart sounds.  Exam reveals no gallop and no friction rub.   No murmur heard. Pulmonary/Chest: Effort normal. No stridor. No respiratory distress. He has wheezes (scattered throughout). He has no rales.  Lymphadenopathy:    He has no cervical adenopathy.  Skin: Skin is warm and dry. He is not diaphoretic.  Vitals reviewed.     Assessment & Plan:     1. Bronchitis Worsening. Will give zpak and prednisone as below. He is to push fluids. May use Mucinex DM or Delsym for cough. Tylenol if needed for body aches or fevers. He is to call if no improvements or symptoms worsen. - azithromycin (ZITHROMAX) 250 MG tablet; Take 2 tablets PO on day one, and one tablet PO daily thereafter until completed.  Dispense: 6 tablet; Refill: 0 - predniSONE (STERAPRED UNI-PAK 21 TAB) 10 MG (21) TBPK tablet; Take as directed on package directions.  Dispense: 21 tablet; Refill: [place], PA-C  [facility]

## 2016-07-13 NOTE — Patient Instructions (Signed)
Delsym or Mucinex DM for cough Push fluids.  Acute Bronchitis, Adult Acute bronchitis is when air tubes (bronchi) in the lungs suddenly get swollen. The condition can make it hard to breathe. It can also cause these symptoms:  A cough.  Coughing up clear, yellow, or green mucus.  Wheezing.  Chest congestion.  Shortness of breath.  A fever.  Body aches.  Chills.  A sore throat. Follow these instructions at home: Medicines  Take over-the-counter and prescription medicines only as told by your doctor.  If you were prescribed an antibiotic medicine, take it as told by your doctor. Do not stop taking the antibiotic even if you start to feel better. General instructions  Rest.  Drink enough fluids to keep your pee (urine) clear or pale yellow.  Avoid smoking and secondhand smoke. If you smoke and you need help quitting, ask your doctor. Quitting will help your lungs heal faster.  Use an inhaler, cool mist vaporizer, or humidifier as told by your doctor.  Keep all follow-up visits as told by your doctor. This is important. How is this prevented? To lower your risk of getting this condition again:  Wash your hands often with soap and water. If you cannot use soap and water, use hand sanitizer.  Avoid contact with people who have cold symptoms.  Try not to touch your hands to your mouth, nose, or eyes.  Make sure to get the flu shot every year. Contact a doctor if:  Your symptoms do not get better in 2 weeks. Get help right away if:  You cough up blood.  You have chest pain.  You have very bad shortness of breath.  You become dehydrated.  You faint (pass out) or keep feeling like you are going to pass out.  You keep throwing up (vomiting).  You have a very bad headache.  Your fever or chills gets worse. This information is not intended to replace advice given to you by your health care provider. Make sure you discuss any questions you have with your health  care provider. Document Released: 10/30/2007 Document Revised: 12/20/2015 Document Reviewed: 11/01/2015 Elsevier Interactive Patient Education  2017 [employer].

## 2016-07-16 ENCOUNTER — Telehealth: Payer: Self-pay | Admitting: Family Medicine

## 2016-07-16 ENCOUNTER — Emergency Department: Payer: PPO

## 2016-07-16 ENCOUNTER — Encounter: Payer: Self-pay | Admitting: Emergency Medicine

## 2016-07-16 ENCOUNTER — Emergency Department
Admission: EM | Admit: 2016-07-16 | Discharge: 2016-07-16 | Disposition: A | Discharge status: Home / Self Care | Payer: PPO | Attending: Emergency Medicine | Admitting: Emergency Medicine

## 2016-07-16 DIAGNOSIS — I2510 Atherosclerotic heart disease of native coronary artery without angina pectoris: Secondary | ICD-10-CM | POA: Diagnosis not present

## 2016-07-16 DIAGNOSIS — I10 Essential (primary) hypertension: Secondary | ICD-10-CM | POA: Diagnosis not present

## 2016-07-16 DIAGNOSIS — R05 Cough: Secondary | ICD-10-CM | POA: Diagnosis not present

## 2016-07-16 DIAGNOSIS — Z87891 Personal history of nicotine dependence: Secondary | ICD-10-CM | POA: Insufficient documentation

## 2016-07-16 DIAGNOSIS — J209 Acute bronchitis, unspecified: Secondary | ICD-10-CM | POA: Diagnosis not present

## 2016-07-16 DIAGNOSIS — Z79899 Other long term (current) drug therapy: Secondary | ICD-10-CM | POA: Insufficient documentation

## 2016-07-16 DIAGNOSIS — E039 Hypothyroidism, unspecified: Secondary | ICD-10-CM | POA: Diagnosis not present

## 2016-07-16 DIAGNOSIS — E119 Type 2 diabetes mellitus without complications: Secondary | ICD-10-CM | POA: Insufficient documentation

## 2016-07-16 DIAGNOSIS — Z7982 Long term (current) use of aspirin: Secondary | ICD-10-CM | POA: Insufficient documentation

## 2016-07-16 DIAGNOSIS — J449 Chronic obstructive pulmonary disease, unspecified: Secondary | ICD-10-CM | POA: Insufficient documentation

## 2016-07-16 DIAGNOSIS — R0602 Shortness of breath: Secondary | ICD-10-CM | POA: Diagnosis not present

## 2016-07-16 LAB — CBC
HEMATOCRIT: 35.6 % — AB (ref 40.0–52.0)
HEMOGLOBIN: 11.8 g/dL — AB (ref 13.0–18.0)
MCH: 30.1 pg (ref 26.0–34.0)
MCHC: 33 g/dL (ref 32.0–36.0)
MCV: 91.3 fL (ref 80.0–100.0)
Platelets: 156 10*3/uL (ref 150–440)
RBC: 3.9 MIL/uL — ABNORMAL LOW (ref 4.40–5.90)
RDW: 14.3 % (ref 11.5–14.5)
WBC: 9.3 10*3/uL (ref 3.8–10.6)

## 2016-07-16 LAB — BASIC METABOLIC PANEL
Anion gap: 4 — ABNORMAL LOW (ref 5–15)
BUN: 18 mg/dL (ref 6–20)
CHLORIDE: 109 mmol/L (ref 101–111)
CO2: 27 mmol/L (ref 22–32)
CREATININE: 0.93 mg/dL (ref 0.61–1.24)
Calcium: 8.4 mg/dL — ABNORMAL LOW (ref 8.9–10.3)
GFR calc Af Amer: >60 mL/min (ref >60)
GFR calc non Af Amer: >60 mL/min (ref >60)
Glucose, Bld: 93 mg/dL (ref 65–99)
POTASSIUM: 4.1 mmol/L (ref 3.5–5.1)
Sodium: 140 mmol/L (ref 135–145)

## 2016-07-16 LAB — TROPONIN I: Troponin I: <0.03 ng/mL (ref <0.03)

## 2016-07-16 LAB — INFLUENZA PANEL BY PCR (TYPE A & B)
INFLAPCR: NEGATIVE
Influenza B By PCR: NEGATIVE

## 2016-07-16 MED ORDER — ALBUTEROL SULFATE (2.5 MG/3ML) 0.083% IN NEBU
INHALATION_SOLUTION | RESPIRATORY_TRACT | Status: AC
  Administered 2016-07-16: 2.5 mg via RESPIRATORY_TRACT
  Filled 2016-07-16: qty 3

## 2016-07-16 MED ORDER — LEVOFLOXACIN 500 MG PO TABS
500.0000 mg | ORAL_TABLET | Freq: Once | ORAL | Status: AC
  Administered 2016-07-16: 500 mg via ORAL

## 2016-07-16 MED ORDER — LEVOFLOXACIN 500 MG PO TABS: 500.0000 mg | ORAL_TABLET | Freq: Every day | ORAL | 0 refills | Status: AC

## 2016-07-16 MED ORDER — ALBUTEROL SULFATE (2.5 MG/3ML) 0.083% IN NEBU
2.5000 mg | INHALATION_SOLUTION | Freq: Once | RESPIRATORY_TRACT | Status: AC
  Administered 2016-07-16: 2.5 mg via RESPIRATORY_TRACT

## 2016-07-16 MED ORDER — ALBUTEROL SULFATE HFA 108 (90 BASE) MCG/ACT IN AERS: 2.0000 | INHALATION_SPRAY | Freq: Four times a day (QID) | RESPIRATORY_TRACT | 0 refills | Status: DC | PRN

## 2016-07-16 MED ORDER — LEVOFLOXACIN 500 MG PO TABS
ORAL_TABLET | ORAL | Status: AC
  Administered 2016-07-16: 500 mg via ORAL
  Filled 2016-07-16: qty 1

## 2016-07-16 NOTE — Telephone Encounter (Signed)
Pt called stated he was just released from the ED.  He states he wanted Dr. Rosanna Randy to know they changed his meds.  He wasn't sure if he needed to come in or if he just needed to call and let you know.

## 2016-07-16 NOTE — ED Triage Notes (Signed)
Pt st since Saturday having bronchitis; taking zithromax and prednisone without relief; st cough prod green sputum, sinus congestion; up to restroom PTA and had SHOB esp with exertion and lying supine; denies pain

## 2016-07-16 NOTE — ED Provider Notes (Signed)
[facility] Emergency Department Provider Note   First MD Initiated Contact with Patient 07/16/16 [phone]     (approximate)  I have reviewed the triage vital signs and the nursing notes.   HISTORY  Chief Complaint Shortness of Breath and Cough    HPI Jared Rice is a 83 y.o. male presents with a history of productive cough congestionand dyspnea. Patient states that he was seen by his primary care provider on Saturday and prescribed azithromycin and prednisone for bronchitis. Patient states however his symptoms have not improved. Patient denies any fever or febrile on presentation with temperature 97.8.   Past Medical History:  Diagnosis Date  . Anginal pain ([place])   . Chronic airway obstruction, not elsewhere classified   . Colon polyps   . Coronary atherosclerosis of native coronary artery    nonobstructive  . Essential hypertension, benign   . Glucose intolerance (impaired glucose tolerance)   . Hypothyroidism   . Iron deficiency anemia   . Knee pain, right   . Occlusion and stenosis of carotid artery without mention of cerebral infarction    wears compression stockings  . Other dyspnea and respiratory abnormality    w/ pseuodowheeze resolves with purse lip manuever  . Precordial pain   . Shoulder pain, right   . Tremor   . Unspecified sleep apnea   . Venous insufficiency   . Wears dentures    full upper    Patient Active Problem List   Diagnosis Date Noted  . Chronic fatigue 11/29/2015  . BPH (benign prostatic hypertrophy) 02/22/2015  . Gastrointestinal ulcer   . History of peptic ulcer   . Gastritis   . Hematochezia   . Acute gastric ulcer   . Melena 11/12/2014  . Malignant neoplasm of skin 09/21/2014  . Cataract 09/21/2014  . Colon polyp 09/21/2014  . CAFL (chronic airflow limitation) ([place]) 09/21/2014  . Diabetes ([place]) 09/21/2014  . DD (diverticular disease) 09/21/2014  . Benign essential tremor 09/21/2014  . Personal  history of tobacco use, presenting hazards to health 09/21/2014  . Acid reflux 09/21/2014  . Hemorrhoids 09/21/2014  . 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 HLD (hyperlipidemia) 09/21/2014  . BP (high blood pressure) 09/21/2014  . Adult hypothyroidism 09/21/2014  . Lymphoma ([place]) 09/21/2014  . Malaise and fatigue 09/21/2014  . Mononeuritis 09/21/2014  . Muscle ache 09/21/2014  . Neuropathy ([place]) 09/21/2014  . Numbness and tingling 09/21/2014  . Arthritis, degenerative 09/21/2014  . Adiposity 09/21/2014  . Awareness of heartbeats 09/21/2014  . Borderline diabetes 09/21/2014  . Acne erythematosa 09/21/2014  . Rotator cuff syndrome 09/21/2014  . Athlete's foot 09/21/2014  . Has a tremor 09/21/2014  . Stasis, venous 09/21/2014  . Avitaminosis D 09/21/2014  . Arthritis of knee, degenerative 11/16/2013  . Bradycardia, sinus 12/31/2011  . Left bundle branch block 12/31/2011  . First degree AV block 12/31/2011  . Edema of both legs 12/31/2011  . COPD UNSPECIFIED 11/09/2009  . HYPERTENSION, BENIGN 10/03/2009  . SLEEP APNEA 10/03/2009  . DYSPNEA 10/03/2009  . CAD, NATIVE VESSEL 05/04/2009  . Carotid disease, bilateral ([place]) 05/04/2009    Past Surgical History:  Procedure Laterality Date  . CARDIAC CATHETERIZATION    . COLONOSCOPY  03/17/12   Dr Byrnett-diverticulosis  . ESOPHAGOGASTRODUODENOSCOPY (EGD) WITH PROPOFOL N/A 11/25/2014   Procedure: ESOPHAGOGASTRODUODENOSCOPY (EGD) WITH PROPOFOL;  Surgeon: Lucilla Lame, MD;  Location: [place];  Service: Endoscopy;  Laterality: N/A;  with biopsy  . ESOPHAGOGASTRODUODENOSCOPY (EGD) WITH PROPOFOL  N/A 01/05/2015   Procedure: ESOPHAGOGASTRODUODENOSCOPY (EGD) WITH PROPOFOL;  Surgeon: Lucilla Lame, MD;  Location: [place];  Service: Endoscopy;  Laterality: N/A;  . EYE SURGERY Bilateral    cataract   . TOTAL KNEE ARTHROPLASTY Left     Prior to Admission medications   Medication Sig Start Date End Date Taking? Authorizing  Provider  aspirin 81 MG tablet Take 81 mg by mouth daily.    Historical Provider, MD  azithromycin (ZITHROMAX) 250 MG tablet Take 2 tablets PO on day one, and one tablet PO daily thereafter until completed. 07/13/16   Mar Daring, PA-C  CRESTOR 20 MG tablet Take 1 tablet by mouth every day 10/19/15   Birdie Sons, MD  diphenhydramine-acetaminophen (TYLENOL PM) 25-500 MG TABS Take 2 tablets by mouth at bedtime as needed.    Historical Provider, MD  doxazosin (CARDURA) 8 MG tablet Take 1 tablet by mouth once daily 12/04/15   Jerrol Banana., MD  furosemide (LASIX) 20 MG tablet Take 1 tablet by mouth daily in the morning 01/15/16   Jerrol Banana., MD  levothyroxine (SYNTHROID, LEVOTHROID) 75 MCG tablet Take 1 tablet by mouth daily in the morning 01/15/16   Jerrol Banana., MD  pantoprazole (PROTONIX) 40 MG tablet Take 1 tablet (40 mg total) by mouth daily before breakfast. 09/20/15   Lucilla Lame, MD  predniSONE (STERAPRED UNI-PAK 21 TAB) 10 MG (21) TBPK tablet Take as directed on package directions. 07/13/16   Mar Daring, PA-C  quinapril (ACCUPRIL) 40 MG tablet Take 1 tablet by mouth daily 10/05/15   Jerrol Banana., MD  topiramate (TOPAMAX) 50 MG tablet TAKE ONE TABLET BY MOUTH TWICE DAILY 06/03/16   Jerrol Banana., MD    Allergies Dexilant [dexlansoprazole] and Primidone  Family History  Problem Relation Age of Onset  . Heart failure Mother 6    congestive  . Heart attack Father 80  . Alcohol abuse Father   . Alzheimer's disease Brother 66  . Alzheimer's disease Sister 6  . Colon cancer Neg Hx   . Liver disease Neg Hx     Social History Social History  Substance Use Topics  . Smoking status: Former Smoker    Packs/day: 2.00    Years: 31.00    Types: Cigarettes    Quit date: 12/24/1980  . Smokeless tobacco: Never Used     Comment: quit in 1982  . Alcohol use No    Review of Systems Constitutional: No fever/chills Eyes: No visual  changes. ENT: No sore throat.Positive for nasal congestion Cardiovascular: Denies chest pain. Respiratory: Positive for productive cough, dyspnea Gastrointestinal: No abdominal pain.  No nausea, no vomiting.  No diarrhea.  No constipation. Genitourinary: Negative for dysuria. Musculoskeletal: Negative for back pain. Skin: Negative for rash. Neurological: Negative for headaches, focal weakness or numbness.  10-point ROS otherwise negative.  ____________________________________________   PHYSICAL EXAM:  VITAL SIGNS: ED Triage Vitals  Enc Vitals Group     BP 07/16/16 0357 (!) 182/81     Pulse Rate 07/16/16 0357 (!) 52     Resp 07/16/16 0357 (!) 22     Temp 07/16/16 0357 97.8 F (36.6 C)     Temp Source 07/16/16 0357 Oral     SpO2 07/16/16 0357 96 %     Weight 07/16/16 0355 275 lb (124.7 kg)     Height 07/16/16 0355 6\' 2"  (1.88 m)     Head Circumference --  Peak Flow --      Pain Score --      Pain Loc --      Pain Edu? --      Excl. in [place]? --     Constitutional: Alert and oriented. Well appearing and in no acute distress. Eyes: Conjunctivae are normal. PERRL. EOMI. Head: Atraumatic. Nose: No congestion/rhinnorhea. Mouth/Throat: Mucous membranes are moist. Oropharynx non-erythematous. Neck: No stridor.  Cardiovascular: Normal rate, regular rhythm. Good peripheral circulation. Grossly normal heart sounds. Respiratory: Normal respiratory effort.  No retractions. Right lower lobe rhonchi Gastrointestinal: Soft and nontender. No distention.  Musculoskeletal: No lower extremity tenderness nor edema. No gross deformities of extremities. Neurologic:  Normal speech and language. No gross focal neurologic deficits are appreciated.  Skin:  Skin is warm, dry and intact. No rash noted. Psychiatric: Mood and affect are normal. Speech and behavior are normal.  ____________________________________________   LABS (all labs ordered are listed, but only abnormal results are  displayed)  Labs Reviewed  CBC - Abnormal; Notable for the following:       Result Value   RBC 3.90 (*)    Hemoglobin 11.8 (*)    HCT 35.6 (*)    All other components within normal limits  BASIC METABOLIC PANEL - Abnormal; Notable for the following:    Calcium 8.4 (*)    Anion gap 4 (*)    All other components within normal limits  TROPONIN I   ____________________________________________  EKG  ED ECG REPORT I, [place] Shayonna Ocampo, the attending physician, personally viewed and interpreted this ECG.   Date: 07/16/2016  EKG Time: 4:01 AM  Rate: 54  Rhythm: Sinus bradycardia with left bundle branch block  Axis: Normal  Intervals: Normal  ST&T Change: None  ____________________________________________  RADIOLOGY I, Col[place] N Chen Holzman, personally viewed and evaluated these images (plain radiographs) as part of my medical decision making, as well as reviewing the written report by the radiologist.  Dg Chest 2 View  Result Date: 07/16/2016 CLINICAL DATA:  Cough and shortness of breath EXAM: CHEST  2 VIEW COMPARISON:  None. FINDINGS: Cardiac silhouette is mildly enlarged with atherosclerotic calcification in the aortic arch. Medial right basilar opacities are unchanged compared to 10/03/2009. No other focal airspace opacities. No pulmonary edema. No pneumothorax or pleural effusion. Mild bilateral peribronchial opacities. IMPRESSION: Cardiomegaly and aortic atherosclerosis without pulmonary edema. Mild peribronchial opacities may indicate underlying bronchitis. Electronically Signed   By: Ulyses Jarred M.D.   On: 07/16/2016 04:44     Procedures   INITIAL IMPRESSION / ASSESSMENT AND PLAN / ED COURSE  Pertinent labs & imaging results that were available during my care of the patient were reviewed by me and considered in my medical decision making (see chart for details).        ____________________________________________  FINAL CLINICAL IMPRESSION(S) / ED DIAGNOSES  Final  diagnoses:  Acute bronchitis, unspecified organism     MEDICATIONS GIVEN DURING THIS VISIT:  Medications - No data to display   NEW OUTPATIENT MEDICATIONS STARTED DURING THIS VISIT:  New Prescriptions   No medications on file    Modified Medications   No medications on file    Discontinued Medications   No medications on file     Note:  This document was prepared using Dragon voice recognition software and may include unintentional dictation errors.    Gregor Hams, MD 07/16/16 [phone]

## 2016-07-16 NOTE — Telephone Encounter (Signed)
Please review. Thanks!  

## 2016-07-18 ENCOUNTER — Ambulatory Visit (INDEPENDENT_AMBULATORY_CARE_PROVIDER_SITE_OTHER): Payer: PPO | Admitting: Family Medicine

## 2016-07-18 VITALS — BP 138/62 | HR 48 | Temp 98.8°F | Resp 20 | Wt 278.0 lb

## 2016-07-18 DIAGNOSIS — J208 Acute bronchitis due to other specified organisms: Secondary | ICD-10-CM

## 2016-07-18 DIAGNOSIS — R350 Frequency of micturition: Secondary | ICD-10-CM | POA: Diagnosis not present

## 2016-07-18 DIAGNOSIS — J40 Bronchitis, not specified as acute or chronic: Secondary | ICD-10-CM

## 2016-07-18 DIAGNOSIS — S51002D Unspecified open wound of left elbow, subsequent encounter: Secondary | ICD-10-CM | POA: Diagnosis not present

## 2016-07-18 DIAGNOSIS — R251 Tremor, unspecified: Secondary | ICD-10-CM | POA: Diagnosis not present

## 2016-07-18 LAB — POCT URINALYSIS DIPSTICK
Bilirubin, UA: NEGATIVE
Blood, UA: NEGATIVE
Glucose, UA: NEGATIVE
Ketones, UA: NEGATIVE
Leukocytes, UA: NEGATIVE
NITRITE UA: NEGATIVE
PROTEIN UA: NEGATIVE
UROBILINOGEN UA: 0.2
pH, UA: 6

## 2016-07-18 LAB — GLUCOSE, POCT (MANUAL RESULT ENTRY): POC GLUCOSE: 87 mg/dL (ref 70–99)

## 2016-07-18 MED ORDER — PREDNISONE 10 MG (21) PO TBPK: ORAL_TABLET | ORAL | 0 refills | Status: DC

## 2016-07-18 NOTE — Progress Notes (Signed)
Jared Rice  MRN: [id number] DOB: Nov 03, 1932  Subjective:  HPI  Patient is here for follow up on 2 issues. Patient thinks he has another UTI. Last office visit for this was on 07/02/16. He was started on Septra DS, urine culture was sent off and showed no infection. Patient finished taking Septra DS and felt fine until yesterday 07/17/16 started to have frequency like every 15 minutes. States puts out a lot of urine.  Also patient was seen on 07/13/16 by jennifer for Bronchitis. Patient was given Zpak and Prednisone, advised to take Mucinex DM or Delsym for cough as needed. He ended up going to ER on 07/16/16 due to waken up about 3:30 am and feeling very short of breath. EKG, labs and chest xray were done there. Diagnoses was acute bronchitis. His zpak was changed to levaquin. He also has been using Albuterol inhaler and has not noticed too much improvement with inhaler. He has been using Robitussin DM at night time and that has helped. His symptoms that is lingering and bothering him is the chest rattling, wheezing. No fever or chills. His heart rate today is 48 and was 52 at ER but states he does not feel lightheaded, weak. Patient Active Problem List   Diagnosis Date Noted  . Chronic fatigue 11/29/2015  . BPH (benign prostatic hypertrophy) 02/22/2015  . Gastrointestinal ulcer   . History of peptic ulcer   . Gastritis   . Hematochezia   . Acute gastric ulcer   . Melena 11/12/2014  . Malignant neoplasm of skin 09/21/2014  . Cataract 09/21/2014  . Colon polyp 09/21/2014  . CAFL (chronic airflow limitation) ([place]) 09/21/2014  . Diabetes ([place]) 09/21/2014  . DD (diverticular disease) 09/21/2014  . Benign essential tremor 09/21/2014  . Personal history of tobacco use, presenting hazards to health 09/21/2014  . Acid reflux 09/21/2014  . Hemorrhoids 09/21/2014  . 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 HLD (hyperlipidemia) 09/21/2014  . BP (high blood pressure) 09/21/2014  . Adult  hypothyroidism 09/21/2014  . Lymphoma ([place]) 09/21/2014  . Malaise and fatigue 09/21/2014  . Mononeuritis 09/21/2014  . Muscle ache 09/21/2014  . Neuropathy ([place]) 09/21/2014  . Numbness and tingling 09/21/2014  . Arthritis, degenerative 09/21/2014  . Adiposity 09/21/2014  . Awareness of heartbeats 09/21/2014  . Borderline diabetes 09/21/2014  . Acne erythematosa 09/21/2014  . Rotator cuff syndrome 09/21/2014  . Athlete's foot 09/21/2014  . Has a tremor 09/21/2014  . Stasis, venous 09/21/2014  . Avitaminosis D 09/21/2014  . Arthritis of knee, degenerative 11/16/2013  . Bradycardia, sinus 12/31/2011  . Left bundle branch block 12/31/2011  . First degree AV block 12/31/2011  . Edema of both legs 12/31/2011  . COPD UNSPECIFIED 11/09/2009  . HYPERTENSION, BENIGN 10/03/2009  . SLEEP APNEA 10/03/2009  . DYSPNEA 10/03/2009  . CAD, NATIVE VESSEL 05/04/2009  . Carotid disease, bilateral ([place]) 05/04/2009    Past Medical History:  Diagnosis Date  . Anginal pain ([place])   . Chronic airway obstruction, not elsewhere classified   . Colon polyps   . Coronary atherosclerosis of native coronary artery    nonobstructive  . Essential hypertension, benign   . Glucose intolerance (impaired glucose tolerance)   . Hypothyroidism   . Iron deficiency anemia   . Knee pain, right   . Occlusion and stenosis of carotid artery without mention of cerebral infarction    wears compression stockings  . Other dyspnea and respiratory abnormality    w/ pseuodowheeze resolves with purse lip  manuever  . Precordial pain   . Shoulder pain, right   . Tremor   . Unspecified sleep apnea   . Venous insufficiency   . Wears dentures    full upper    Social History   Social History  . Marital status: Married    Spouse name: N/A  . Number of children: 4  . Years of education: N/A   Occupational History  . retired     [employer]   Social History Main Topics  . Smoking status:  Former Smoker    Packs/day: 2.00    Years: 31.00    Types: Cigarettes    Quit date: 12/24/1980  . Smokeless tobacco: Never Used     Comment: quit in 1982  . Alcohol use No  . Drug use: No  . Sexual activity: Not on file   Other Topics Concern  . Not on file   Social History Narrative   Lives with wife, retired [employer], 2 children, 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    Outpatient Encounter Prescriptions as of 07/18/2016  Medication Sig  . albuterol (PROVENTIL HFA;VENTOLIN HFA) 108 (90 Base) MCG/ACT inhaler Inhale 2 puffs into the lungs every 6 (six) hours as needed for wheezing or shortness of breath.  [name] aspirin 81 MG tablet Take 81 mg by mouth daily.  . CRESTOR 20 MG tablet Take 1 tablet by mouth every day  . diphenhydramine-acetaminophen (TYLENOL PM) 25-500 MG TABS Take 2 tablets by mouth at bedtime as needed.  . doxazosin (CARDURA) 8 MG tablet Take 1 tablet by mouth once daily  . furosemide (LASIX) 20 MG tablet Take 1 tablet by mouth daily in the morning  . levofloxacin (LEVAQUIN) 500 MG tablet Take 1 tablet (500 mg total) by mouth daily.  [name] levothyroxine (SYNTHROID, LEVOTHROID) 75 MCG tablet Take 1 tablet by mouth daily in the morning  . pantoprazole (PROTONIX) 40 MG tablet Take 1 tablet (40 mg total) by mouth daily before breakfast.  . predniSONE (STERAPRED UNI-PAK 21 TAB) 10 MG (21) TBPK tablet Take as directed on package directions.  . quinapril (ACCUPRIL) 40 MG tablet Take 1 tablet by mouth daily  . topiramate (TOPAMAX) 50 MG tablet TAKE ONE TABLET BY MOUTH TWICE DAILY  . [DISCONTINUED] azithromycin (ZITHROMAX) 250 MG tablet Take 2 tablets PO on day one, and one tablet PO daily thereafter until completed.   No facility-administered encounter medications on file as of 07/18/2016.     Allergies  Allergen Reactions  . Dexilant [Dexlansoprazole] Rash  . Primidone Itching and Rash    rash    Review of Systems  Constitutional: Positive for malaise/fatigue.  HENT: Positive for  congestion.        Drainage  Respiratory: Positive for cough, sputum production, shortness of breath and wheezing.   Cardiovascular: Negative.   Gastrointestinal: Negative.   Genitourinary: Positive for frequency and urgency.  Musculoskeletal: Negative.   Skin:       Wound is healing on the left elbow  Neurological: Positive for tremors (right hand) and weakness. Negative for dizziness and headaches.    Objective:  BP 138/62   Pulse (!) 48   Temp 98.8 F (37.1 C)   Resp 20   Wt 278 lb (126.1 kg)   SpO2 95%   BMI 35.69 kg/m   Physical Exam  Constitutional: He is oriented to person, place, and time and well-developed, well-nourished, and in no distress.  HENT:  Head: Normocephalic and atraumatic.  Eyes: Conjunctivae are normal. Pupils are  equal, round, and reactive to light.  Neck: Normal range of motion. Neck supple.  Cardiovascular: Regular rhythm, normal heart sounds and intact distal pulses.   No murmur heard. Pulmonary/Chest: He has wheezes (mild diffuse expiratory wheezes.).  Neurological: He is alert and oriented to person, place, and time.  Psychiatric: Mood, memory, affect and judgment normal.   Assessment and Plan :  1. Acute bronchitis due to other specified organisms I think patient is gradually improving. Advised patient to take Levaquin and Prednisone sent in case he feels worse then add Prednisone. Follow as needed.  2. Open wound of left elbow, subsequent encounter Healing well.  3. Urine frequency Levaquin will cover if patient is getting infection. UA is ok and Glucose level is ok. Follow as needed. - POCT Urinalysis Dipstick - POCT Glucose (CBG)  - predniSONE (STERAPRED UNI-PAK 21 TAB) 10 MG (21) TBPK tablet; Take as directed on package directions.  Dispense: 21 tablet; Refill: 0  5. Essential tremor 6.Obesity 7.BPH HPI, Exam and A&P transcribed under direction and in the presence of [name], MD. I have done the exam and reviewed the chart  and it is accurate to the best of my knowledge. Development worker, community has been used and  any errors in dictation or transcription are unintentional. [name] M.D. [facility]

## 2016-08-06 DIAGNOSIS — M79674 Pain in right toe(s): Secondary | ICD-10-CM | POA: Diagnosis not present

## 2016-08-06 DIAGNOSIS — M2041 Other hammer toe(s) (acquired), right foot: Secondary | ICD-10-CM | POA: Diagnosis not present

## 2016-08-06 DIAGNOSIS — B351 Tinea unguium: Secondary | ICD-10-CM | POA: Diagnosis not present

## 2016-08-06 DIAGNOSIS — M79675 Pain in left toe(s): Secondary | ICD-10-CM | POA: Diagnosis not present

## 2016-08-08 ENCOUNTER — Ambulatory Visit (INDEPENDENT_AMBULATORY_CARE_PROVIDER_SITE_OTHER): Payer: PPO | Admitting: Family Medicine

## 2016-08-08 VITALS — BP 134/64 | HR 64 | Temp 98.3°F | Resp 16 | Wt 275.0 lb

## 2016-08-08 DIAGNOSIS — I10 Essential (primary) hypertension: Secondary | ICD-10-CM | POA: Diagnosis not present

## 2016-08-08 DIAGNOSIS — E039 Hypothyroidism, unspecified: Secondary | ICD-10-CM | POA: Diagnosis not present

## 2016-08-08 DIAGNOSIS — E119 Type 2 diabetes mellitus without complications: Secondary | ICD-10-CM

## 2016-08-08 DIAGNOSIS — E785 Hyperlipidemia, unspecified: Secondary | ICD-10-CM

## 2016-08-08 NOTE — Progress Notes (Signed)
Jared Rice  MRN: [id number] DOB: [birth year]  Subjective:  HPI   The patient is an 83 year old male who presents for follow up of his diabetes and hypertension.  He was last seen in February for an acute visit but otherwise was seen in November for his chronic conditions.  His last A1C on 04/10/16 was 5.8.  He has not been checking his glucose at home and reports no symptoms or events suggestive of hypoglycemia.  The patient is also due for his labs to be checked for cholesterol and thyroid.  Patient Active Problem List   Diagnosis Date Noted  . Chronic fatigue 11/29/2015  . BPH (benign prostatic hypertrophy) 02/22/2015  . Gastrointestinal ulcer   . History of peptic ulcer   . Gastritis   . Hematochezia   . Acute gastric ulcer   . Melena 11/12/2014  . Malignant neoplasm of skin 09/21/2014  . Cataract 09/21/2014  . Colon polyp 09/21/2014  . CAFL (chronic airflow limitation) ([place]) 09/21/2014  . Diabetes ([place]) 09/21/2014  . DD (diverticular disease) 09/21/2014  . Benign essential tremor 09/21/2014  . Personal history of tobacco use, presenting hazards to health 09/21/2014  . Acid reflux 09/21/2014  . Hemorrhoids 09/21/2014  . 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 HLD (hyperlipidemia) 09/21/2014  . BP (high blood pressure) 09/21/2014  . Adult hypothyroidism 09/21/2014  . Lymphoma ([place]) 09/21/2014  . Malaise and fatigue 09/21/2014  . Mononeuritis 09/21/2014  . Muscle ache 09/21/2014  . Neuropathy ([place]) 09/21/2014  . Numbness and tingling 09/21/2014  . Arthritis, degenerative 09/21/2014  . Adiposity 09/21/2014  . Awareness of heartbeats 09/21/2014  . Borderline diabetes 09/21/2014  . Acne erythematosa 09/21/2014  . Rotator cuff syndrome 09/21/2014  . Athlete's foot 09/21/2014  . Has a tremor 09/21/2014  . Stasis, venous 09/21/2014  . Avitaminosis D 09/21/2014  . Arthritis of knee, degenerative 11/16/2013  . Bradycardia, sinus 12/31/2011  . Left bundle branch block  12/31/2011  . First degree AV block 12/31/2011  . Edema of both legs 12/31/2011  . COPD UNSPECIFIED 11/09/2009  . HYPERTENSION, BENIGN 10/03/2009  . SLEEP APNEA 10/03/2009  . DYSPNEA 10/03/2009  . CAD, NATIVE VESSEL 05/04/2009  . Carotid disease, bilateral ([place]) 05/04/2009    Past Medical History:  Diagnosis Date  . Anginal pain ([place])   . Chronic airway obstruction, not elsewhere classified   . Colon polyps   . Coronary atherosclerosis of native coronary artery    nonobstructive  . Essential hypertension, benign   . Glucose intolerance (impaired glucose tolerance)   . Hypothyroidism   . Iron deficiency anemia   . Knee pain, right   . Occlusion and stenosis of carotid artery without mention of cerebral infarction    wears compression stockings  . Other dyspnea and respiratory abnormality    w/ pseuodowheeze resolves with purse lip manuever  . Precordial pain   . Shoulder pain, right   . Tremor   . Unspecified sleep apnea   . Venous insufficiency   . Wears dentures    full upper    Social History   Social History  . Marital status: Married    Spouse name: N/A  . Number of children: 4  . Years of education: N/A   Occupational History  . retired     [employer]   Social History Main Topics  . Smoking status: Former Smoker    Packs/day: 2.00    Years: 31.00    Types: Cigarettes  Quit date: 12/24/1980  . Smokeless tobacco: Never Used     Comment: quit in 1982  . Alcohol use No  . Drug use: No  . Sexual activity: Not on file   Other Topics Concern  . Not on file   Social History Narrative   Lives with wife, retired [employer], 2 children, 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    Outpatient Encounter Prescriptions as of 08/08/2016  Medication Sig  . albuterol (PROVENTIL HFA;VENTOLIN HFA) 108 (90 Base) MCG/ACT inhaler Inhale 2 puffs into the lungs every 6 (six) hours as needed for wheezing or shortness of breath.  [name] aspirin 81 MG tablet Take 81 mg  by mouth daily.  . CRESTOR 20 MG tablet Take 1 tablet by mouth every day  . diphenhydramine-acetaminophen (TYLENOL PM) 25-500 MG TABS Take 2 tablets by mouth at bedtime as needed.  . doxazosin (CARDURA) 8 MG tablet Take 1 tablet by mouth once daily  . furosemide (LASIX) 20 MG tablet Take 1 tablet by mouth daily in the morning  . levothyroxine (SYNTHROID, LEVOTHROID) 75 MCG tablet Take 1 tablet by mouth daily in the morning  . pantoprazole (PROTONIX) 40 MG tablet Take 1 tablet (40 mg total) by mouth daily before breakfast.  . quinapril (ACCUPRIL) 40 MG tablet Take 1 tablet by mouth daily  . topiramate (TOPAMAX) 50 MG tablet TAKE ONE TABLET BY MOUTH TWICE DAILY  . [DISCONTINUED] predniSONE (STERAPRED UNI-PAK 21 TAB) 10 MG (21) TBPK tablet Take as directed on package directions.   No facility-administered encounter medications on file as of 08/08/2016.     Allergies  Allergen Reactions  . Dexilant [Dexlansoprazole] Rash  . Primidone Itching and Rash    rash    Review of Systems  Constitutional: Negative for fever and malaise/fatigue.  Eyes: Negative.   Respiratory: Negative for cough, shortness of breath and wheezing.   Cardiovascular: Negative for chest pain, palpitations, orthopnea and leg swelling.  Gastrointestinal: Negative.   Genitourinary: Negative for dysuria.  Neurological: Negative for dizziness, weakness and headaches.  Endo/Heme/Allergies: Negative.  Negative for polydipsia.  Psychiatric/Behavioral: Negative.     Objective:  BP 134/64 (BP Location: Right Arm, Patient Position: Sitting, Cuff Size: Normal)   Pulse 64   Temp 98.3 F (36.8 C) (Oral)   Resp 16   Wt 275 lb (124.7 kg)   BMI 35.31 kg/m   Physical Exam  Constitutional: He is oriented to person, place, and time and well-developed, well-nourished, and in no distress.  HENT:  Head: Normocephalic and atraumatic.  Right Ear: External ear normal.  Left Ear: External ear normal.  Nose: Nose normal.  Eyes:  Conjunctivae are normal.  Neck: No thyromegaly present.  Cardiovascular: Normal rate, regular rhythm and normal heart sounds.   Pulmonary/Chest: Effort normal and breath sounds normal.  Abdominal: Soft. Bowel sounds are normal.  Musculoskeletal: He exhibits edema.  2+ edema with support hose  Neurological: He is alert and oriented to person, place, and time.  Skin: Skin is warm and dry.  Psychiatric: Mood, memory, affect and judgment normal.    Assessment and Plan :  TIIDM Foot exam and uring microalbumin on next OV. BPH HTN Oesity  I have done the exam and reviewed the chart and it is accurate to the best of my knowledge. Development worker, community has been used and  any errors in dictation or transcription are unintentional. [name] M.D. [facility]

## 2016-08-21 ENCOUNTER — Ambulatory Visit (INDEPENDENT_AMBULATORY_CARE_PROVIDER_SITE_OTHER): Payer: PPO | Admitting: Family Medicine

## 2016-08-21 ENCOUNTER — Encounter: Payer: Self-pay | Admitting: Family Medicine

## 2016-08-21 VITALS — BP 154/64 | HR 60 | Temp 97.7°F | Resp 16 | Wt 277.0 lb

## 2016-08-21 DIAGNOSIS — J301 Allergic rhinitis due to pollen: Secondary | ICD-10-CM | POA: Diagnosis not present

## 2016-08-21 DIAGNOSIS — E119 Type 2 diabetes mellitus without complications: Secondary | ICD-10-CM | POA: Diagnosis not present

## 2016-08-21 DIAGNOSIS — E039 Hypothyroidism, unspecified: Secondary | ICD-10-CM | POA: Diagnosis not present

## 2016-08-21 DIAGNOSIS — I10 Essential (primary) hypertension: Secondary | ICD-10-CM | POA: Diagnosis not present

## 2016-08-21 DIAGNOSIS — E785 Hyperlipidemia, unspecified: Secondary | ICD-10-CM | POA: Diagnosis not present

## 2016-08-21 DIAGNOSIS — H7391 Unspecified disorder of tympanic membrane, right ear: Secondary | ICD-10-CM | POA: Diagnosis not present

## 2016-08-21 MED ORDER — FLUTICASONE PROPIONATE 50 MCG/ACT NA SUSP: 2.0000 | Freq: Every day | NASAL | 6 refills | Status: DC

## 2016-08-21 NOTE — Progress Notes (Signed)
Patient: HAZE ANTILLON Male    DOB: 1932-10-28   83 y.o.   MRN: [id number] Visit Date: 08/21/2016  Today's Provider: Wilhemena Durie, MD   Chief Complaint  Patient presents with  . Ear Problem   Subjective:    HPI   Ear Problem Mr. Mollenhauer is in office today for a possible ear irrigation. Pt is c/o bilateral plugged ear sensation, right more so than left. This has been occurring for 2 weeks. Pt denies ear pain, ear discharge, hearing loss, tinnitus. Pt denies allergy sx such as cough, sneezing, PND, sinus pain/pressure, sore throat. He is experiencing congestion.   Allergies  Allergen Reactions  . Dexilant [Dexlansoprazole] Rash  . Primidone Itching and Rash    rash     Current Outpatient Prescriptions:  .  aspirin 81 MG tablet, Take 81 mg by mouth daily., Disp: , Rfl:  .  CRESTOR 20 MG tablet, Take 1 tablet by mouth every day, Disp: 90 tablet, Rfl: 1 .  diphenhydramine-acetaminophen (TYLENOL PM) 25-500 MG TABS, Take 2 tablets by mouth at bedtime as needed., Disp: , Rfl:  .  doxazosin (CARDURA) 8 MG tablet, Take 1 tablet by mouth once daily, Disp: 90 tablet, Rfl: 3 .  furosemide (LASIX) 20 MG tablet, Take 1 tablet by mouth daily in the morning, Disp: 90 tablet, Rfl: 2 .  levothyroxine (SYNTHROID, LEVOTHROID) 75 MCG tablet, Take 1 tablet by mouth daily in the morning, Disp: 90 tablet, Rfl: 3 .  pantoprazole (PROTONIX) 40 MG tablet, Take 1 tablet (40 mg total) by mouth daily before breakfast., Disp: 30 tablet, Rfl: 11 .  quinapril (ACCUPRIL) 40 MG tablet, Take 1 tablet by mouth daily, Disp: 90 tablet, Rfl: 3 .  topiramate (TOPAMAX) 50 MG tablet, TAKE ONE TABLET BY MOUTH TWICE DAILY, Disp: 60 tablet, Rfl: 5 .  albuterol (PROVENTIL HFA;VENTOLIN HFA) 108 (90 Base) MCG/ACT inhaler, Inhale 2 puffs into the lungs every 6 (six) hours as needed for wheezing or shortness of breath. (Patient not taking: Reported on 08/21/2016), Disp: 1 Inhaler, Rfl: 0  Review of Systems  HENT:  Positive for congestion. Negative for ear discharge, ear pain, hearing loss, postnasal drip, rhinorrhea, sinus pain, sinus pressure, sneezing, sore throat and tinnitus.   Respiratory: Negative for cough.     Social History  Substance Use Topics  . Smoking status: Former Smoker    Packs/day: 2.00    Years: 31.00    Types: Cigarettes    Quit date: 12/24/1980  . Smokeless tobacco: Never Used     Comment: quit in 1982  . Alcohol use No   Objective:   BP (!) 154/64 (BP Location: Right Arm, Patient Position: Sitting, Cuff Size: Large) Comment: pt has not had medications so he could have fasting labs  Pulse 60   Temp 97.7 F (36.5 C) (Oral)   Resp 16   Wt 277 lb (125.6 kg)   BMI 35.56 kg/m  Vitals:   08/21/16 0914  BP: (!) 154/64  Pulse: 60  Resp: 16  Temp: 97.7 F (36.5 C)  TempSrc: Oral  Weight: 277 lb (125.6 kg)     Physical Exam  Constitutional: He appears well-developed and well-nourished.  HENT:  Head: Normocephalic.  Scar tissue in right TM. Cerumen in left ear  Eyes: Conjunctivae are normal.  Psychiatric: He has a normal mood and affect. His behavior is normal.        Assessment & Plan:     1. Abnormal  tympanic membrane of right ear Unclear etiology. Question TM scar vs cholesteatoma. Will start Flonase and refer to ENT.  - fluticasone (FLONASE) 50 MCG/ACT nasal spray; Place 2 sprays into both nostrils daily.  Dispense: 16 g; Refill: 6 - Ambulatory referral to ENT  2. Acute seasonal allergic rhinitis due to pollen Start Flonase. 3.cerumen left ear    Patient seen and examined by [name], MD, and note scribed by [name], [place].  Richard Cranford Mon, MD  [facility]

## 2016-08-22 ENCOUNTER — Telehealth: Payer: Self-pay | Admitting: Family Medicine

## 2016-08-22 LAB — CBC WITH DIFFERENTIAL/PLATELET
BASOS: 0 %
Basophils Absolute: 0 10*3/uL (ref 0.0–0.2)
EOS (ABSOLUTE): 0.3 10*3/uL (ref 0.0–0.4)
Eos: 6 %
HEMATOCRIT: 35.3 % — AB (ref 37.5–51.0)
Hemoglobin: 11.5 g/dL — ABNORMAL LOW (ref 13.0–17.7)
IMMATURE GRANULOCYTES: 0 %
Immature Grans (Abs): 0 10*3/uL (ref 0.0–0.1)
LYMPHS ABS: 1.3 10*3/uL (ref 0.7–3.1)
Lymphs: 24 %
MCH: 29.9 pg (ref 26.6–33.0)
MCHC: 32.6 g/dL (ref 31.5–35.7)
MCV: 92 fL (ref 79–97)
MONOS ABS: 0.5 10*3/uL (ref 0.1–0.9)
Monocytes: 9 %
NEUTROS PCT: 61 %
Neutrophils Absolute: 3.4 10*3/uL (ref 1.4–7.0)
PLATELETS: 162 10*3/uL (ref 150–379)
RBC: 3.84 x10E6/uL — ABNORMAL LOW (ref 4.14–5.80)
RDW: 14.9 % (ref 12.3–15.4)
WBC: 5.6 10*3/uL (ref 3.4–10.8)

## 2016-08-22 LAB — COMPREHENSIVE METABOLIC PANEL
A/G RATIO: 2.2 (ref 1.2–2.2)
ALT: 8 IU/L (ref 0–44)
AST: 11 IU/L (ref 0–40)
Albumin: 3.8 g/dL (ref 3.5–4.7)
Alkaline Phosphatase: 58 IU/L (ref 39–117)
BUN/Creatinine Ratio: 14 (ref 10–24)
BUN: 13 mg/dL (ref 8–27)
Bilirubin Total: 0.3 mg/dL (ref 0.0–1.2)
CALCIUM: 8.4 mg/dL — AB (ref 8.6–10.2)
CHLORIDE: 107 mmol/L — AB (ref 96–106)
CO2: 25 mmol/L (ref 18–29)
Creatinine, Ser: 0.95 mg/dL (ref 0.76–1.27)
GFR calc Af Amer: 86 mL/min/{1.73_m2} (ref >59)
GFR calc non Af Amer: 75 mL/min/{1.73_m2} (ref >59)
GLOBULIN, TOTAL: 1.7 g/dL (ref 1.5–4.5)
Glucose: 95 mg/dL (ref 65–99)
POTASSIUM: 3.6 mmol/L (ref 3.5–5.2)
SODIUM: 145 mmol/L — AB (ref 134–144)
Total Protein: 5.5 g/dL — ABNORMAL LOW (ref 6.0–8.5)

## 2016-08-22 LAB — HEMOGLOBIN A1C
ESTIMATED AVERAGE GLUCOSE: 126 mg/dL
Hgb A1c MFr Bld: 6 % — ABNORMAL HIGH (ref 4.8–5.6)

## 2016-08-22 LAB — LIPID PANEL WITH LDL/HDL RATIO
Cholesterol, Total: 94 mg/dL — ABNORMAL LOW (ref 100–199)
HDL: 39 mg/dL — AB (ref >39)
LDL Calculated: 49 mg/dL (ref 0–99)
LDL/HDL RATIO: 1.3 ratio (ref 0.0–3.6)
TRIGLYCERIDES: 31 mg/dL (ref 0–149)
VLDL Cholesterol Cal: 6 mg/dL (ref 5–40)

## 2016-08-22 LAB — TSH: TSH: 3.72 u[IU]/mL (ref 0.450–4.500)

## 2016-08-22 MED ORDER — ONDANSETRON HCL 4 MG PO TABS: 4.0000 mg | ORAL_TABLET | Freq: Four times a day (QID) | ORAL | 0 refills | Status: DC | PRN

## 2016-08-22 NOTE — Telephone Encounter (Signed)
Pt wife informed. Medication sent in.

## 2016-08-22 NOTE — Telephone Encounter (Signed)
Spoke with pt. He reports that he has not been able to even keep water down. He is afraid of becoming dehydrated. Denies fevers, chills, sweats. Has a little generalized abdominal pain but then he usually goes to the bathroom and its relived. Denies dizziness or weakness. Thinks its a stomach bugs. Pt wants to know if there is anything you can give him to be able to at least keep water or Ginger ale down. Also wants to know if he could take [place]. Please advise. Thanks.    [facility] [address]

## 2016-08-22 NOTE — Telephone Encounter (Signed)
[place] for [employer] when he can keep things down. Try Ondansetron 4 mg q 6 hrs prn nausea,#35

## 2016-08-22 NOTE — Telephone Encounter (Signed)
Pt has been having vomiting and diarrhea since this am  Please advise  No fever  Thanks Con Memos

## 2016-08-26 ENCOUNTER — Telehealth: Payer: Self-pay

## 2016-08-26 MED ORDER — PROMETHAZINE HCL 25 MG PO TABS: 25.0000 mg | ORAL_TABLET | Freq: Four times a day (QID) | ORAL | 0 refills | Status: DC | PRN

## 2016-08-26 NOTE — Telephone Encounter (Signed)
rx sent in-aa 

## 2016-08-26 NOTE — Telephone Encounter (Signed)
Promethazine 25 mg q 6 hrs prn,#35

## 2016-08-26 NOTE — Telephone Encounter (Signed)
Fax from pharmacy came in for PA for Ondansetron, do you want to proceed with PA or change to a different medication-aa

## 2016-08-28 DIAGNOSIS — M25511 Pain in right shoulder: Secondary | ICD-10-CM | POA: Diagnosis not present

## 2016-08-28 DIAGNOSIS — G8929 Other chronic pain: Secondary | ICD-10-CM | POA: Diagnosis not present

## 2016-08-28 DIAGNOSIS — M7581 Other shoulder lesions, right shoulder: Secondary | ICD-10-CM | POA: Diagnosis not present

## 2016-09-13 DIAGNOSIS — H6981 Other specified disorders of Eustachian tube, right ear: Secondary | ICD-10-CM | POA: Diagnosis not present

## 2016-09-13 DIAGNOSIS — H93299 Other abnormal auditory perceptions, unspecified ear: Secondary | ICD-10-CM | POA: Diagnosis not present

## 2016-09-13 DIAGNOSIS — H6123 Impacted cerumen, bilateral: Secondary | ICD-10-CM | POA: Diagnosis not present

## 2016-10-14 ENCOUNTER — Other Ambulatory Visit: Payer: Self-pay | Admitting: Family Medicine

## 2016-10-14 ENCOUNTER — Telehealth: Payer: Self-pay | Admitting: Family Medicine

## 2016-10-18 ENCOUNTER — Other Ambulatory Visit: Payer: Self-pay | Admitting: Gastroenterology

## 2016-10-18 DIAGNOSIS — B9681 Helicobacter pylori [H. pylori] as the cause of diseases classified elsewhere: Secondary | ICD-10-CM

## 2016-10-18 DIAGNOSIS — K253 Acute gastric ulcer without hemorrhage or perforation: Principal | ICD-10-CM

## 2016-10-22 ENCOUNTER — Other Ambulatory Visit: Payer: Self-pay | Admitting: Family Medicine

## 2016-10-23 ENCOUNTER — Other Ambulatory Visit: Payer: Self-pay

## 2016-10-23 DIAGNOSIS — B9681 Helicobacter pylori [H. pylori] as the cause of diseases classified elsewhere: Secondary | ICD-10-CM

## 2016-10-23 DIAGNOSIS — K253 Acute gastric ulcer without hemorrhage or perforation: Principal | ICD-10-CM

## 2016-10-23 MED ORDER — PANTOPRAZOLE SODIUM 40 MG PO TBEC: 40.0000 mg | DELAYED_RELEASE_TABLET | Freq: Every day | ORAL | 3 refills | Status: DC

## 2016-10-28 DIAGNOSIS — L570 Actinic keratosis: Secondary | ICD-10-CM | POA: Diagnosis not present

## 2016-10-28 DIAGNOSIS — L821 Other seborrheic keratosis: Secondary | ICD-10-CM | POA: Diagnosis not present

## 2016-10-28 DIAGNOSIS — L812 Freckles: Secondary | ICD-10-CM | POA: Diagnosis not present

## 2016-10-28 DIAGNOSIS — D485 Neoplasm of uncertain behavior of skin: Secondary | ICD-10-CM | POA: Diagnosis not present

## 2016-10-28 DIAGNOSIS — D229 Melanocytic nevi, unspecified: Secondary | ICD-10-CM | POA: Diagnosis not present

## 2016-10-28 DIAGNOSIS — D1800 Hemangioma unspecified site: Secondary | ICD-10-CM | POA: Diagnosis not present

## 2016-10-28 DIAGNOSIS — L578 Other skin changes due to chronic exposure to nonionizing radiation: Secondary | ICD-10-CM | POA: Diagnosis not present

## 2016-10-28 DIAGNOSIS — D692 Other nonthrombocytopenic purpura: Secondary | ICD-10-CM | POA: Diagnosis not present

## 2016-10-28 DIAGNOSIS — Z1283 Encounter for screening for malignant neoplasm of skin: Secondary | ICD-10-CM | POA: Diagnosis not present

## 2016-10-28 DIAGNOSIS — Z85828 Personal history of other malignant neoplasm of skin: Secondary | ICD-10-CM | POA: Diagnosis not present

## 2016-11-04 DIAGNOSIS — R600 Localized edema: Secondary | ICD-10-CM | POA: Diagnosis not present

## 2016-11-04 DIAGNOSIS — M79675 Pain in left toe(s): Secondary | ICD-10-CM | POA: Diagnosis not present

## 2016-11-04 DIAGNOSIS — M79674 Pain in right toe(s): Secondary | ICD-10-CM | POA: Diagnosis not present

## 2016-11-04 DIAGNOSIS — B351 Tinea unguium: Secondary | ICD-10-CM | POA: Diagnosis not present

## 2016-11-04 DIAGNOSIS — M2041 Other hammer toe(s) (acquired), right foot: Secondary | ICD-10-CM | POA: Diagnosis not present

## 2016-11-06 ENCOUNTER — Ambulatory Visit (INDEPENDENT_AMBULATORY_CARE_PROVIDER_SITE_OTHER): Payer: PPO | Admitting: Family Medicine

## 2016-11-06 ENCOUNTER — Encounter: Payer: Self-pay | Admitting: Family Medicine

## 2016-11-06 VITALS — BP 130/68 | HR 54 | Temp 97.9°F | Resp 16 | Wt 277.0 lb

## 2016-11-06 DIAGNOSIS — R600 Localized edema: Secondary | ICD-10-CM

## 2016-11-06 MED ORDER — FUROSEMIDE 20 MG PO TABS: 40.0000 mg | ORAL_TABLET | Freq: Every day | ORAL | 3 refills | Status: DC

## 2016-11-06 NOTE — Progress Notes (Signed)
Patient: Jared Rice Male    DOB: 10-Apr-1933   83 y.o.   MRN: [id number] Visit Date: 11/06/2016  Today's Provider: Wilhemena Durie, MD   Chief Complaint  Patient presents with  . Edema   Subjective:    HPI    Edema: Patient complains of edema. The location of the edema is lower leg(s) bilateral, ankle(s) bilateral, feet bilateral.  The edema has been severe.  Onset of symptoms was 1 week ago, gradually improving since that time. The edema is present all day. The patient states never.  The swelling has been aggravated by nothing, relieved by diuretics, support stockings, elevation of involved area, and been associated with venous insufficiency. Cardiac risk factors include advanced age (older than 18 for men, 38 for women), hypertension, male gender and obesity (BMI >= 30 kg/m2). Pt has increased his Lasix to 40 mg po qd (as instructed by Dr. Elvina Mattes), with relief.  Wt Readings from Last 3 Encounters:  11/06/16 277 lb (125.6 kg)  08/21/16 277 lb (125.6 kg)  08/08/16 275 lb (124.7 kg)     Allergies  Allergen Reactions  . Dexilant [Dexlansoprazole] Rash  . Primidone Itching and Rash    rash     Current Outpatient Prescriptions:  .  aspirin 81 MG tablet, Take 81 mg by mouth daily., Disp: , Rfl:  .  diphenhydramine-acetaminophen (TYLENOL PM) 25-500 MG TABS, Take 2 tablets by mouth at bedtime as needed., Disp: , Rfl:  .  doxazosin (CARDURA) 8 MG tablet, Take 1 tablet by mouth once daily, Disp: 90 tablet, Rfl: 3 .  furosemide (LASIX) 20 MG tablet, Take 1 tablet by mouth daily in the morning, Disp: 90 tablet, Rfl: 2 .  levothyroxine (SYNTHROID, LEVOTHROID) 75 MCG tablet, Take 1 tablet by mouth daily in the morning, Disp: 90 tablet, Rfl: 3 .  pantoprazole (PROTONIX) 40 MG tablet, Take 1 tablet (40 mg total) by mouth daily before breakfast. **PLEASE SCHEDULE FOLLOW UP APPT**, Disp: 30 tablet, Rfl: 3 .  quinapril (ACCUPRIL) 40 MG tablet, Take 1 tablet by mouth daily, Disp:  90 tablet, Rfl: 3 .  rosuvastatin (CRESTOR) 20 MG tablet, Take 1 tablet by mouth every day, Disp: 90 tablet, Rfl: 3 .  topiramate (TOPAMAX) 50 MG tablet, TAKE ONE TABLET BY MOUTH TWICE DAILY, Disp: 60 tablet, Rfl: 5 .  albuterol (PROVENTIL HFA;VENTOLIN HFA) 108 (90 Base) MCG/ACT inhaler, Inhale 2 puffs into the lungs every 6 (six) hours as needed for wheezing or shortness of breath. (Patient not taking: Reported on 11/06/2016), Disp: 1 Inhaler, Rfl: 0 .  fluticasone (FLONASE) 50 MCG/ACT nasal spray, Place 2 sprays into both nostrils daily. (Patient not taking: Reported on 11/06/2016), Disp: 16 g, Rfl: 6  Review of Systems  Constitutional: Negative for activity change, appetite change, chills, diaphoresis, fatigue, fever and unexpected weight change.  Eyes: Negative.   Respiratory: Negative for shortness of breath.   Cardiovascular: Positive for leg swelling. Negative for chest pain and palpitations.  Endocrine: Negative.   Allergic/Immunologic: Negative.   Hematological: Negative.   Psychiatric/Behavioral: Negative.     Social History  Substance Use Topics  . Smoking status: Former Smoker    Packs/day: 2.00    Years: 31.00    Types: Cigarettes    Quit date: 12/24/1980  . Smokeless tobacco: Never Used     Comment: quit in 1982  . Alcohol use No   Objective:   BP 130/68 (BP Location: Right Arm, Patient Position: Sitting, Cuff  Size: Large)   Pulse (!) 54   Temp 97.9 F (36.6 C) (Oral)   Resp 16   Wt 277 lb (125.6 kg)   SpO2 93%   BMI 35.56 kg/m  Vitals:   11/06/16 0921  BP: 130/68  Pulse: (!) 54  Resp: 16  Temp: 97.9 F (36.6 C)  TempSrc: Oral  SpO2: 93%  Weight: 277 lb (125.6 kg)     Physical Exam  Constitutional: He appears well-developed and well-nourished.  HENT:  Head: Normocephalic and atraumatic.  Right Ear: External ear normal.  Left Ear: External ear normal.  Nose: Nose normal.  Eyes: Conjunctivae are normal.  Neck: Normal range of motion.    Cardiovascular: Regular rhythm and normal heart sounds.   Pulmonary/Chest: Effort normal and breath sounds normal. No respiratory distress.  Abdominal: Soft.  Musculoskeletal: He exhibits edema.  1+ edema.  Skin: Skin is warm and dry.  Psychiatric: He has a normal mood and affect. His behavior is normal.        Assessment & Plan:     1. Edema of both legs Worsening. Is well controlled on 40 mg of Lasix daily. Continue increased dose of diuretic. Refill provided. FU in 2 weeks. Will need to check potassium level at that time. Pt advised to eat a banana a day to replenish potassium. - furosemide (LASIX) 20 MG tablet; Take 2 tablets (40 mg total) by mouth daily.  Dispense: 180 tablet; Refill: 3  2.Obesity  Pt also needs to discuss arthralgias while sleeping on bed. He is currently sleeping in recliner, but is requesting a prescription for an adjustable bed. Advised pt we will discuss this at 2 week FU.     I have done the exam and reviewed the above chart and it is accurate to the best of my knowledge. Development worker, community has been used in this note in any air is in the dictation or transcription are unintentional.  Wilhemena Durie, MD  [facility]

## 2016-11-06 NOTE — Patient Instructions (Addendum)
Increase furosemide to 2 tablets daily and eat a banana daily. Come in for a follow up in 2 weeks to recheck swelling and check potassium level.

## 2016-11-12 ENCOUNTER — Ambulatory Visit: Payer: PPO | Admitting: Family Medicine

## 2016-11-20 ENCOUNTER — Ambulatory Visit (INDEPENDENT_AMBULATORY_CARE_PROVIDER_SITE_OTHER): Payer: PPO | Admitting: Family Medicine

## 2016-11-20 VITALS — BP 90/50 | HR 78 | Resp 14 | Wt 274.0 lb

## 2016-11-20 DIAGNOSIS — M25561 Pain in right knee: Secondary | ICD-10-CM

## 2016-11-20 DIAGNOSIS — G8929 Other chronic pain: Secondary | ICD-10-CM

## 2016-11-20 DIAGNOSIS — I10 Essential (primary) hypertension: Secondary | ICD-10-CM | POA: Diagnosis not present

## 2016-11-20 DIAGNOSIS — M25551 Pain in right hip: Secondary | ICD-10-CM | POA: Diagnosis not present

## 2016-11-20 DIAGNOSIS — M25562 Pain in left knee: Secondary | ICD-10-CM | POA: Diagnosis not present

## 2016-11-20 DIAGNOSIS — M25552 Pain in left hip: Secondary | ICD-10-CM

## 2016-11-20 DIAGNOSIS — R600 Localized edema: Secondary | ICD-10-CM

## 2016-11-20 MED ORDER — QUINAPRIL HCL 20 MG PO TABS: 20.0000 mg | ORAL_TABLET | Freq: Every day | ORAL | 3 refills | Status: DC

## 2016-11-20 MED ORDER — FUROSEMIDE 20 MG PO TABS: 20.0000 mg | ORAL_TABLET | Freq: Every day | ORAL | 3 refills | Status: DC

## 2016-11-20 NOTE — Progress Notes (Signed)
Jared Rice  MRN: [id number] DOB: 1933/03/31  Subjective:  HPI  Patient is here for 2 week follow up on edema. At last visit patient was advised to increase Lasix to 2 tablets daily to equal 40 mg. Edema is much better per patient. He also got new hose to wear to help with swelling. Need to check Potassium level today. Wt Readings from Last 3 Encounters:  11/20/16 274 lb (124.3 kg)  11/06/16 277 lb (125.6 kg)  08/21/16 277 lb (125.6 kg)   BP Readings from Last 3 Encounters:  11/20/16 (!) 90/50  11/06/16 130/68  08/21/16 (!) 154/64   Patient also need to discuss getting an adjustable bed. Patient currently sleeping in the recliner. Symptoms are bilateral severe hip pain, knees. Unable to sleep in the normal bed at this time due to severity.  Patient Active Problem List   Diagnosis Date Noted  . Chronic fatigue 11/29/2015  . BPH (benign prostatic hypertrophy) 02/22/2015  . Gastrointestinal ulcer   . History of peptic ulcer   . Gastritis   . Hematochezia   . Acute gastric ulcer   . Melena 11/12/2014  . Malignant neoplasm of skin 09/21/2014  . Cataract 09/21/2014  . Colon polyp 09/21/2014  . CAFL (chronic airflow limitation) ([place]) 09/21/2014  . Diabetes ([place]) 09/21/2014  . DD (diverticular disease) 09/21/2014  . Benign essential tremor 09/21/2014  . Personal history of tobacco use, presenting hazards to health 09/21/2014  . Acid reflux 09/21/2014  . Hemorrhoids 09/21/2014  . 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 HLD (hyperlipidemia) 09/21/2014  . BP (high blood pressure) 09/21/2014  . Adult hypothyroidism 09/21/2014  . Lymphoma ([place]) 09/21/2014  . Malaise and fatigue 09/21/2014  . Mononeuritis 09/21/2014  . Muscle ache 09/21/2014  . Neuropathy 09/21/2014  . Numbness and tingling 09/21/2014  . Arthritis, degenerative 09/21/2014  . Adiposity 09/21/2014  . Awareness of heartbeats 09/21/2014  . Borderline diabetes 09/21/2014  . Acne erythematosa 09/21/2014  . Rotator  cuff syndrome 09/21/2014  . Athlete's foot 09/21/2014  . Has a tremor 09/21/2014  . Stasis, venous 09/21/2014  . Avitaminosis D 09/21/2014  . Arthritis of knee, degenerative 11/16/2013  . Bradycardia, sinus 12/31/2011  . Left bundle branch block 12/31/2011  . First degree AV block 12/31/2011  . Edema of both legs 12/31/2011  . COPD UNSPECIFIED 11/09/2009  . HYPERTENSION, BENIGN 10/03/2009  . SLEEP APNEA 10/03/2009  . DYSPNEA 10/03/2009  . CAD, NATIVE VESSEL 05/04/2009  . Carotid disease, bilateral ([place]) 05/04/2009    Past Medical History:  Diagnosis Date  . Anginal pain ([place])   . Chronic airway obstruction, not elsewhere classified   . Colon polyps   . Coronary atherosclerosis of native coronary artery    nonobstructive  . Essential hypertension, benign   . Glucose intolerance (impaired glucose tolerance)   . Hypothyroidism   . Iron deficiency anemia   . Knee pain, right   . Occlusion and stenosis of carotid artery without mention of cerebral infarction    wears compression stockings  . Other dyspnea and respiratory abnormality    w/ pseuodowheeze resolves with purse lip manuever  . Precordial pain   . Shoulder pain, right   . Tremor   . Unspecified sleep apnea   . Venous insufficiency   . Wears dentures    full upper    Social History   Social History  . Marital status: Married    Spouse name: N/A  . Number of children: 4  . Years of  education: N/A   Occupational History  . retired     [employer]   Social History Main Topics  . Smoking status: Former Smoker    Packs/day: 2.00    Years: 31.00    Types: Cigarettes    Quit date: 12/24/1980  . Smokeless tobacco: Never Used     Comment: quit in 1982  . Alcohol use No  . Drug use: No  . Sexual activity: Not on file   Other Topics Concern  . Not on file   Social History Narrative   Lives with wife, retired [employer], 2 children, 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    Outpatient Encounter  Prescriptions as of 11/20/2016  Medication Sig  . aspirin 81 MG tablet Take 81 mg by mouth daily.  . diphenhydramine-acetaminophen (TYLENOL PM) 25-500 MG TABS Take 2 tablets by mouth at bedtime as needed.  . doxazosin (CARDURA) 8 MG tablet Take 1 tablet by mouth once daily  . furosemide (LASIX) 20 MG tablet Take 2 tablets (40 mg total) by mouth daily.  [name] levothyroxine (SYNTHROID, LEVOTHROID) 75 MCG tablet Take 1 tablet by mouth daily in the morning  . pantoprazole (PROTONIX) 40 MG tablet Take 1 tablet (40 mg total) by mouth daily before breakfast. **PLEASE SCHEDULE FOLLOW UP APPT**  . quinapril (ACCUPRIL) 40 MG tablet Take 1 tablet by mouth daily  . rosuvastatin (CRESTOR) 20 MG tablet Take 1 tablet by mouth every day  . topiramate (TOPAMAX) 50 MG tablet TAKE ONE TABLET BY MOUTH TWICE DAILY  . albuterol (PROVENTIL HFA;VENTOLIN HFA) 108 (90 Base) MCG/ACT inhaler Inhale 2 puffs into the lungs every 6 (six) hours as needed for wheezing or shortness of breath. (Patient not taking: Reported on 11/06/2016)  . fluticasone (FLONASE) 50 MCG/ACT nasal spray Place 2 sprays into both nostrils daily. (Patient not taking: Reported on 11/06/2016)   No facility-administered encounter medications on file as of 11/20/2016.     Allergies  Allergen Reactions  . Dexilant [Dexlansoprazole] Rash  . Primidone Itching and Rash    rash    Review of Systems  Constitutional: Negative.   Eyes: Negative.   Respiratory: Negative.   Cardiovascular: Negative.   Gastrointestinal: Negative.   Musculoskeletal: Positive for back pain (sometimes) and joint pain (hips and knees.).  Skin: Negative.   Neurological: Negative.   Endo/Heme/Allergies: Negative.   Psychiatric/Behavioral: Negative.     Objective:  BP (!) 90/50   Pulse 78   Resp 14   Wt 274 lb (124.3 kg)   BMI 35.18 kg/m   Physical Exam  Constitutional: He is oriented to person, place, and time and well-developed, well-nourished, and in no distress.  HENT:   Head: Normocephalic and atraumatic.  Right Ear: External ear normal.  Left Ear: External ear normal.  Nose: Nose normal.  Eyes: Conjunctivae are normal. No scleral icterus.  Neck: No thyromegaly present.  Cardiovascular: Normal rate, regular rhythm, normal heart sounds and intact distal pulses.   No murmur heard. Pulmonary/Chest: Effort normal and breath sounds normal. No respiratory distress. He has no wheezes.  Abdominal: Soft.  Musculoskeletal: He exhibits edema (1+).  Neurological: He is alert and oriented to person, place, and time. Gait normal.  Skin: Skin is warm and dry.  Psychiatric: Mood, memory, affect and judgment normal.   Assessment and Plan :  1. Edema of both legs Better. Decrease Lasix to 1 tablet daily-20 mg. Check alb work today. Re check in 1 month. - Renal function panel - furosemide (LASIX) 20 MG tablet;  Take 1 tablet (20 mg total) by mouth daily.  Dispense: 180 tablet; Refill: 3  2. HYPERTENSION, BENIGN Hypotensive today. Decrease Lasix back down to 1 tablet daily and decrease Quinapril to 20 mg. Re check in 1 month. - Renal function panel  3. Pain of both hip joints RX for adjustable bed provided for patient.  4. Chronic pain of both knees See above plan.  HPI, Exam and A&P transcribed by Theressa Millard, RMA under direction and in the presence of [name], MD. I have done the exam and reviewed the chart and it is accurate to the best of my knowledge. Development worker, community has been used and  any errors in dictation or transcription are unintentional. [name] M.D. [facility]

## 2016-11-20 NOTE — Patient Instructions (Signed)
Cut Quinapril in half-to 20 mg. RX sent int to [facility] for this. Go back to Lasix at 1 tablet daily-20 mg.

## 2016-11-21 LAB — RENAL FUNCTION PANEL
Albumin: 4.3 g/dL (ref 3.5–4.7)
BUN / CREAT RATIO: 15 (ref 10–24)
BUN: 18 mg/dL (ref 8–27)
CALCIUM: 8.8 mg/dL (ref 8.6–10.2)
CHLORIDE: 105 mmol/L (ref 96–106)
CO2: 27 mmol/L (ref 20–29)
Creatinine, Ser: 1.24 mg/dL (ref 0.76–1.27)
GFR calc Af Amer: 62 mL/min/{1.73_m2} (ref >59)
GFR calc non Af Amer: 54 mL/min/{1.73_m2} — ABNORMAL LOW (ref >59)
Glucose: 103 mg/dL — ABNORMAL HIGH (ref 65–99)
POTASSIUM: 4.1 mmol/L (ref 3.5–5.2)
Phosphorus: 3.1 mg/dL (ref 2.5–4.5)
SODIUM: 146 mmol/L — AB (ref 134–144)

## 2016-12-02 DIAGNOSIS — R21 Rash and other nonspecific skin eruption: Secondary | ICD-10-CM | POA: Diagnosis not present

## 2016-12-02 DIAGNOSIS — Z85828 Personal history of other malignant neoplasm of skin: Secondary | ICD-10-CM | POA: Diagnosis not present

## 2016-12-02 DIAGNOSIS — D485 Neoplasm of uncertain behavior of skin: Secondary | ICD-10-CM | POA: Diagnosis not present

## 2016-12-02 DIAGNOSIS — B353 Tinea pedis: Secondary | ICD-10-CM | POA: Diagnosis not present

## 2016-12-03 ENCOUNTER — Other Ambulatory Visit: Payer: Self-pay | Admitting: Family Medicine

## 2016-12-09 DIAGNOSIS — D485 Neoplasm of uncertain behavior of skin: Secondary | ICD-10-CM | POA: Diagnosis not present

## 2016-12-09 DIAGNOSIS — B353 Tinea pedis: Secondary | ICD-10-CM | POA: Diagnosis not present

## 2016-12-09 DIAGNOSIS — L98499 Non-pressure chronic ulcer of skin of other sites with unspecified severity: Secondary | ICD-10-CM | POA: Diagnosis not present

## 2016-12-09 DIAGNOSIS — C44319 Basal cell carcinoma of skin of other parts of face: Secondary | ICD-10-CM | POA: Diagnosis not present

## 2016-12-09 DIAGNOSIS — R234 Changes in skin texture: Secondary | ICD-10-CM | POA: Diagnosis not present

## 2016-12-11 ENCOUNTER — Ambulatory Visit: Payer: PPO | Admitting: Family Medicine

## 2016-12-19 ENCOUNTER — Ambulatory Visit: Payer: PPO | Admitting: Family Medicine

## 2016-12-23 DIAGNOSIS — R21 Rash and other nonspecific skin eruption: Secondary | ICD-10-CM | POA: Diagnosis not present

## 2016-12-23 DIAGNOSIS — C44319 Basal cell carcinoma of skin of other parts of face: Secondary | ICD-10-CM | POA: Diagnosis not present

## 2016-12-23 DIAGNOSIS — L578 Other skin changes due to chronic exposure to nonionizing radiation: Secondary | ICD-10-CM | POA: Diagnosis not present

## 2016-12-25 ENCOUNTER — Ambulatory Visit (INDEPENDENT_AMBULATORY_CARE_PROVIDER_SITE_OTHER): Payer: PPO | Admitting: Family Medicine

## 2016-12-25 ENCOUNTER — Encounter: Payer: Self-pay | Admitting: Family Medicine

## 2016-12-25 VITALS — BP 140/54 | HR 60 | Temp 97.4°F | Resp 20 | Wt 281.0 lb

## 2016-12-25 DIAGNOSIS — R601 Generalized edema: Secondary | ICD-10-CM

## 2016-12-25 DIAGNOSIS — I10 Essential (primary) hypertension: Secondary | ICD-10-CM

## 2016-12-25 NOTE — Progress Notes (Signed)
Patient: Jared Rice Male    DOB: May 13, 1933   83 y.o.   MRN: [id number] Visit Date: 12/25/2016  Today's Provider: Wilhemena Durie, MD   Chief Complaint  Patient presents with  . Edema  . Hypertension   Subjective:    HPI     Follow up for Edema  The patient was last seen for this 1 months ago. Changes made at last visit include decreasing Lasix back to 20 mg po qd.  He reports good compliance with treatment. He feels that condition is Unchanged. He is not having side effects.   ------------------------------------------------------------------------------------   Hypertension, follow-up:  BP Readings from Last 3 Encounters:  12/25/16 (!) 140/54  11/20/16 (!) 90/50  11/06/16 130/68    He was last seen for hypertension 1 months ago.  BP at that visit was 90/50. Management since that visit includes decreasing Quinapril to 20 mg, as well as decreasing Lasix to 20 mg. He reports good compliance with treatment. He is not having side effects.  He is not exercising. He is adherent to low salt diet.   Outside blood pressures are not being checked. He is experiencing exertional chest pressure/discomfort and lower extremity edema. The exertional chest pain has been occurring for 20 years, and has been worked up by cardiology and pulmonology. Patient denies chest pain, chest pressure/discomfort, claudication, dyspnea, fatigue, irregular heart beat, near-syncope, orthopnea, palpitations and syncope.   Cardiovascular risk factors include advanced age (older than 35 for men, 52 for women), diabetes mellitus, dyslipidemia, hypertension, male gender and obesity (BMI >= 30 kg/m2).  Use of agents associated with hypertension: none.     Weight trend: increasing steadily Wt Readings from Last 3 Encounters:  12/25/16 281 lb (127.5 kg)  11/20/16 274 lb (124.3 kg)  11/06/16 277 lb (125.6 kg)    Current diet: in general, a "healthy" diet     ------------------------------------------------------------------------   Allergies  Allergen Reactions  . Dexilant [Dexlansoprazole] Rash  . Primidone Itching and Rash    rash     Current Outpatient Prescriptions:  .  aspirin 81 MG tablet, Take 81 mg by mouth daily., Disp: , Rfl:  .  diphenhydramine-acetaminophen (TYLENOL PM) 25-500 MG TABS, Take 2 tablets by mouth at bedtime as needed., Disp: , Rfl:  .  doxazosin (CARDURA) 8 MG tablet, Take 1 tablet by mouth once daily, Disp: 90 tablet, Rfl: 2 .  fluconazole (DIFLUCAN) 150 MG tablet, Take 1 tablet by mouth every other day., Disp: , Rfl:  .  furosemide (LASIX) 20 MG tablet, Take 1 tablet (20 mg total) by mouth daily., Disp: 180 tablet, Rfl: 3 .  levothyroxine (SYNTHROID, LEVOTHROID) 75 MCG tablet, Take 1 tablet by mouth daily in the morning, Disp: 90 tablet, Rfl: 3 .  pantoprazole (PROTONIX) 40 MG tablet, Take 1 tablet (40 mg total) by mouth daily before breakfast. **PLEASE SCHEDULE FOLLOW UP APPT**, Disp: 30 tablet, Rfl: 3 .  quinapril (ACCUPRIL) 20 MG tablet, Take 1 tablet (20 mg total) by mouth daily., Disp: 90 tablet, Rfl: 3 .  topiramate (TOPAMAX) 50 MG tablet, TAKE ONE TABLET BY MOUTH TWICE DAILY, Disp: 60 tablet, Rfl: 5 .  albuterol (PROVENTIL HFA;VENTOLIN HFA) 108 (90 Base) MCG/ACT inhaler, Inhale 2 puffs into the lungs every 6 (six) hours as needed for wheezing or shortness of breath. (Patient not taking: Reported on 11/06/2016), Disp: 1 Inhaler, Rfl: 0 .  fluticasone (FLONASE) 50 MCG/ACT nasal spray, Place 2 sprays into both nostrils daily. (  Patient not taking: Reported on 11/06/2016), Disp: 16 g, Rfl: 6 .  rosuvastatin (CRESTOR) 20 MG tablet, Take 1 tablet by mouth every day (Patient not taking: Reported on 12/25/2016), Disp: 90 tablet, Rfl: 3  Review of Systems  Constitutional: Negative for activity change, appetite change, chills, diaphoresis, fatigue, fever and unexpected weight change.  Respiratory: Negative for shortness  of breath.   Cardiovascular: Positive for leg swelling. Negative for chest pain and palpitations.    Social History  Substance Use Topics  . Smoking status: Former Smoker    Packs/day: 2.00    Years: 31.00    Types: Cigarettes    Quit date: 12/24/1980  . Smokeless tobacco: Never Used     Comment: quit in 1982  . Alcohol use No   Objective:   BP (!) 140/54 (BP Location: Left Arm, Patient Position: Sitting, Cuff Size: Large)   Pulse 60   Temp (!) 97.4 F (36.3 C) (Oral)   Resp 20   Wt 281 lb (127.5 kg)   SpO2 95%   BMI 36.08 kg/m  Vitals:   12/25/16 1555  BP: (!) 140/54  Pulse: 60  Resp: 20  Temp: (!) 97.4 F (36.3 C)  TempSrc: Oral  SpO2: 95%  Weight: 281 lb (127.5 kg)     Physical Exam  Constitutional: He appears well-developed and well-nourished.  HENT:  Head: Normocephalic.  Eyes: Conjunctivae are normal.  Neck: Normal range of motion.  Cardiovascular: Normal rate, regular rhythm and normal heart sounds.   Pulmonary/Chest: Effort normal and breath sounds normal. No respiratory distress.  Musculoskeletal: He exhibits edema.  Psychiatric: He has a normal mood and affect. His behavior is normal.        Assessment & Plan:     1. Essential hypertension Pt reports he feels fine, so am hesitant to make med changes. Will FU 4 months at CPE, or sooner if needed.  2. Generalized edema/venous stasis  -Renal Function Panel Will recheck renal panel. His kidney function has decreased last time this was checked. Will not make med changes at this time. FU pending labs. 3.Obesity     I have done the exam and reviewed the above chart and it is accurate to the best of my knowledge. Development worker, community has been used in this note in any air is in the dictation or transcription are unintentional.  Wilhemena Durie, MD  [place]

## 2016-12-26 LAB — RENAL FUNCTION PANEL
Albumin: 3.9 g/dL (ref 3.5–4.7)
BUN/Creatinine Ratio: 12 (ref 10–24)
BUN: 14 mg/dL (ref 8–27)
CALCIUM: 8.5 mg/dL — AB (ref 8.6–10.2)
CO2: 28 mmol/L (ref 20–29)
CREATININE: 1.21 mg/dL (ref 0.76–1.27)
Chloride: 107 mmol/L — ABNORMAL HIGH (ref 96–106)
GFR calc Af Amer: 64 mL/min/{1.73_m2} (ref >59)
GFR calc non Af Amer: 55 mL/min/{1.73_m2} — ABNORMAL LOW (ref >59)
GLUCOSE: 91 mg/dL (ref 65–99)
PHOSPHORUS: 3.3 mg/dL (ref 2.5–4.5)
POTASSIUM: 4.2 mmol/L (ref 3.5–5.2)
SODIUM: 146 mmol/L — AB (ref 134–144)

## 2016-12-27 DIAGNOSIS — M7581 Other shoulder lesions, right shoulder: Secondary | ICD-10-CM | POA: Diagnosis not present

## 2016-12-31 ENCOUNTER — Other Ambulatory Visit: Payer: Self-pay | Admitting: Family Medicine

## 2016-12-31 NOTE — Telephone Encounter (Signed)
Please review for Dr Gilbert. Thank you-aa 

## 2017-01-08 ENCOUNTER — Encounter: Payer: Self-pay | Admitting: Cardiology

## 2017-01-13 DIAGNOSIS — Z85828 Personal history of other malignant neoplasm of skin: Secondary | ICD-10-CM | POA: Diagnosis not present

## 2017-01-13 DIAGNOSIS — L821 Other seborrheic keratosis: Secondary | ICD-10-CM | POA: Diagnosis not present

## 2017-01-13 DIAGNOSIS — L820 Inflamed seborrheic keratosis: Secondary | ICD-10-CM | POA: Diagnosis not present

## 2017-01-13 DIAGNOSIS — B359 Dermatophytosis, unspecified: Secondary | ICD-10-CM | POA: Diagnosis not present

## 2017-01-26 NOTE — Progress Notes (Deleted)
Jared Rice Date of Birth: 1932/07/27 Medical Record #[id number]  History of Present Illness: Jared Rice is seen today for follow up angina.  He has had several cardiac caths over the years revealing nonobstructive disease with scattered 20-30% lesions. He had a nuclear stress test in January 2012 which was normal. He had an echocardiogram in August 2013 which showed LVH with normal systolic function and mildly elevated PA systolic pressure. Patient has a history of morbid obesity with obstructive sleep apnea. He has not used the CPAP  because he could never get comfortable with this.   On followup today he states he is doing well. He has lost an additional 11 lbs.  Propranolol was discontinued due to bradycardia. He denies any chest pain or SOB. He is fairly sedentary. He wears support hose and has no edema.  Current Outpatient Prescriptions on File Prior to Visit  Medication Sig Dispense Refill  . albuterol (PROVENTIL HFA;VENTOLIN HFA) 108 (90 Base) MCG/ACT inhaler Inhale 2 puffs into the lungs every 6 (six) hours as needed for wheezing or shortness of breath. (Patient not taking: Reported on 11/06/2016) 1 Inhaler 0  . aspirin 81 MG tablet Take 81 mg by mouth daily.    . diphenhydramine-acetaminophen (TYLENOL PM) 25-500 MG TABS Take 2 tablets by mouth at bedtime as needed.    . doxazosin (CARDURA) 8 MG tablet Take 1 tablet by mouth once daily 90 tablet 2  . fluconazole (DIFLUCAN) 150 MG tablet Take 1 tablet by mouth every other day.    . fluticasone (FLONASE) 50 MCG/ACT nasal spray Place 2 sprays into both nostrils daily. (Patient not taking: Reported on 11/06/2016) 16 g 6  . furosemide (LASIX) 20 MG tablet Take 1 tablet (20 mg total) by mouth daily. 180 tablet 3  . levothyroxine (SYNTHROID, LEVOTHROID) 75 MCG tablet Take 1 tablet by mouth daily in the morning 90 tablet 3  . pantoprazole (PROTONIX) 40 MG tablet Take 1 tablet (40 mg total) by mouth daily before breakfast. **PLEASE SCHEDULE  FOLLOW UP APPT** 30 tablet 3  . quinapril (ACCUPRIL) 20 MG tablet Take 1 tablet (20 mg total) by mouth daily. 90 tablet 3  . rosuvastatin (CRESTOR) 20 MG tablet Take 1 tablet by mouth every day (Patient not taking: Reported on 12/25/2016) 90 tablet 3  . topiramate (TOPAMAX) 50 MG tablet TAKE ONE TABLET BY MOUTH TWICE DAILY 60 tablet 2   No current facility-administered medications on file prior to visit.     Allergies  Allergen Reactions  . Dexilant [Dexlansoprazole] Rash  . Primidone Itching and Rash    rash    Past Medical History:  Diagnosis Date  . Anginal pain ([place])   . Chronic airway obstruction, not elsewhere classified   . Colon polyps   . Coronary atherosclerosis of native coronary artery    nonobstructive  . Essential hypertension, benign   . Glucose intolerance (impaired glucose tolerance)   . Hypothyroidism   . Iron deficiency anemia   . Knee pain, right   . Occlusion and stenosis of carotid artery without mention of cerebral infarction    wears compression stockings  . Other dyspnea and respiratory abnormality    w/ pseuodowheeze resolves with purse lip manuever  . Precordial pain   . Shoulder pain, right   . Tremor   . Unspecified sleep apnea   . Venous insufficiency   . Wears dentures    full upper    Past Surgical History:  Procedure Laterality Date  .  CARDIAC CATHETERIZATION    . COLONOSCOPY  03/17/12   Dr Byrnett-diverticulosis  . ESOPHAGOGASTRODUODENOSCOPY (EGD) WITH PROPOFOL N/A 11/25/2014   Procedure: ESOPHAGOGASTRODUODENOSCOPY (EGD) WITH PROPOFOL;  Surgeon: Lucilla Lame, MD;  Location: [facility];  Service: Endoscopy;  Laterality: N/A;  with biopsy  . ESOPHAGOGASTRODUODENOSCOPY (EGD) WITH PROPOFOL N/A 01/05/2015   Procedure: ESOPHAGOGASTRODUODENOSCOPY (EGD) WITH PROPOFOL;  Surgeon: Lucilla Lame, MD;  Location: [place];  Service: Endoscopy;  Laterality: N/A;  . EYE SURGERY Bilateral    cataract   . TOTAL KNEE ARTHROPLASTY Left      History  Smoking Status  . Former Smoker  . Packs/day: 2.00  . Years: 31.00  . Types: Cigarettes  . Quit date: 12/24/1980  Smokeless Tobacco  . Never Used    Comment: quit in 1982    History  Alcohol Use No    Family History  Problem Relation Age of Onset  . Heart failure Mother 71       congestive  . Heart attack Father 22  . Alcohol abuse Father   . Alzheimer's disease Brother 31  . Alzheimer's disease Sister 31  . Colon cancer Neg Hx   . Liver disease Neg Hx     Review of Systems: The review of systems is as noted in HPI. All other systems were reviewed and are negative.  Physical Exam: There were no vitals taken for this visit. He is an obese white male in no acute distress. HEENT: Normocephalic, atraumatic. Pupils equal round and reactive light accommodation. Extraocular movements are full. Oropharynx is clear. Neck is without JVD, adenopathy, thyromegaly, or bruits. Lungs: Clear Cardiovascular: Regular rate and rhythm. Normal S1 and S2. No gallop, murmur, or click. Abdomen: Morbidly obese, soft, nontender. Bowel sounds are positive. No masses. Extremities. no lower extremity edema. Wearing support hose. Chronic hyperpigmentation. Pulses are 2+. Neuro: Alert and oriented x3. Cranial nerves II through XII are intact.  LABORATORY DATA: ECG today shows NSR with LBBB. Rate 55. No change. I have personally reviewed and interpreted this study.  Lab Results  Component Value Date   WBC 5.6 08/21/2016   HGB 11.5 (L) 08/21/2016   HCT 35.3 (L) 08/21/2016   PLT 162 08/21/2016   GLUCOSE 91 12/25/2016   CHOL 94 (L) 08/21/2016   TRIG 31 08/21/2016   HDL 39 (L) 08/21/2016   LDLCALC 49 08/21/2016   ALT 8 08/21/2016   AST 11 08/21/2016   NA 146 (H) 12/25/2016   K 4.2 12/25/2016   CL 107 (H) 12/25/2016   CREATININE 1.21 12/25/2016   BUN 14 12/25/2016   CO2 28 12/25/2016   TSH 3.720 08/21/2016   PSA 1.6 04/14/2014   HGBA1C 6.0 (H) 08/21/2016      Assessment  / Plan: 1. Marked sinus bradycardia.Improved off beta blocker therapy. No other arrhythmia noted.  2. Coronary disease-nonobstructive. Patient has no significant angina. Continue risk factor modification. 3. Carotid arterial disease-nonobstructive by doppler this year.  4. Morbid obesity with obstructive sleep apnea. I recommended increased aerobic activity. Encourage continued weight loss. 5. Chronic left bundle branch block. 6. Chronic edema secondary to venous insufficiency. I recommended sodium restriction and use of compression hose.  I'll followup again in one year.

## 2017-01-29 ENCOUNTER — Encounter: Payer: Self-pay | Admitting: Cardiology

## 2017-01-29 ENCOUNTER — Ambulatory Visit (INDEPENDENT_AMBULATORY_CARE_PROVIDER_SITE_OTHER): Payer: PPO | Admitting: Cardiology

## 2017-01-29 VITALS — BP 149/82 | HR 52 | Ht 74.0 in | Wt 273.0 lb

## 2017-01-29 DIAGNOSIS — I25118 Atherosclerotic heart disease of native coronary artery with other forms of angina pectoris: Secondary | ICD-10-CM | POA: Diagnosis not present

## 2017-01-29 DIAGNOSIS — I447 Left bundle-branch block, unspecified: Secondary | ICD-10-CM | POA: Diagnosis not present

## 2017-01-29 DIAGNOSIS — E785 Hyperlipidemia, unspecified: Secondary | ICD-10-CM

## 2017-01-29 NOTE — Patient Instructions (Signed)
Continue your current therapy  I will see you in one year   

## 2017-01-29 NOTE — Progress Notes (Signed)
Jared Rice Date of Birth: 01/30/1933 Medical Record #[id number]  History of Present Illness: Jared Rice is seen today for follow up angina.  He has had several cardiac caths over the years revealing nonobstructive disease with scattered 20-30% lesions. He had a nuclear stress test in January 2012 which was normal. He had an echocardiogram in August 2013 which showed LVH with normal systolic function and mildly elevated PA systolic pressure. Patient has a history of morbid obesity with obstructive sleep apnea. He has not used the CPAP  because he could never get comfortable with this.   On followup today he states he is doing well. He did have a mechanical fall in February with a bad abrasion in left arm but no fracture. He has been off  Propranolol  due to bradycardia. He does have some chest pain walking up stairs that is relieved with rest. This has been present for many years without change. He is fairly sedentary. He wears support hose and has no edema. Quinapril dose was reduced in June due to hypotension with BP 90/60.  Current Outpatient Prescriptions on File Prior to Visit  Medication Sig Dispense Refill  . aspirin 81 MG tablet Take 81 mg by mouth daily.    . diphenhydramine-acetaminophen (TYLENOL PM) 25-500 MG TABS Take 2 tablets by mouth at bedtime as needed.    . doxazosin (CARDURA) 8 MG tablet Take 1 tablet by mouth once daily 90 tablet 2  . furosemide (LASIX) 20 MG tablet Take 1 tablet (20 mg total) by mouth daily. 180 tablet 3  . levothyroxine (SYNTHROID, LEVOTHROID) 75 MCG tablet Take 1 tablet by mouth daily in the morning 90 tablet 3  . pantoprazole (PROTONIX) 40 MG tablet Take 1 tablet (40 mg total) by mouth daily before breakfast. **PLEASE SCHEDULE FOLLOW UP APPT** 30 tablet 3  . quinapril (ACCUPRIL) 20 MG tablet Take 1 tablet (20 mg total) by mouth daily. 90 tablet 3  . rosuvastatin (CRESTOR) 20 MG tablet Take 1 tablet by mouth every day 90 tablet 3  . topiramate  (TOPAMAX) 50 MG tablet TAKE ONE TABLET BY MOUTH TWICE DAILY 60 tablet 2   No current facility-administered medications on file prior to visit.     Allergies  Allergen Reactions  . Dexilant [Dexlansoprazole] Rash  . Primidone Itching and Rash    rash    Past Medical History:  Diagnosis Date  . Anginal pain ([place])   . Chronic airway obstruction, not elsewhere classified   . Colon polyps   . Coronary atherosclerosis of native coronary artery    nonobstructive  . Essential hypertension, benign   . Glucose intolerance (impaired glucose tolerance)   . Hypothyroidism   . Iron deficiency anemia   . Knee pain, right   . Occlusion and stenosis of carotid artery without mention of cerebral infarction    wears compression stockings  . Other dyspnea and respiratory abnormality    w/ pseuodowheeze resolves with purse lip manuever  . Precordial pain   . Shoulder pain, right   . Tremor   . Unspecified sleep apnea   . Venous insufficiency   . Wears dentures    full upper    Past Surgical History:  Procedure Laterality Date  . CARDIAC CATHETERIZATION    . COLONOSCOPY  03/17/12   Dr Byrnett-diverticulosis  . ESOPHAGOGASTRODUODENOSCOPY (EGD) WITH PROPOFOL N/A 11/25/2014   Procedure: ESOPHAGOGASTRODUODENOSCOPY (EGD) WITH PROPOFOL;  Surgeon: Lucilla Lame, MD;  Location: [facility];  Service: Endoscopy;  Laterality:  N/A;  with biopsy  . ESOPHAGOGASTRODUODENOSCOPY (EGD) WITH PROPOFOL N/A 01/05/2015   Procedure: ESOPHAGOGASTRODUODENOSCOPY (EGD) WITH PROPOFOL;  Surgeon: Lucilla Lame, MD;  Location: [facility];  Service: Endoscopy;  Laterality: N/A;  . EYE SURGERY Bilateral    cataract   . TOTAL KNEE ARTHROPLASTY Left     History  Smoking Status  . Former Smoker  . Packs/day: 2.00  . Years: 31.00  . Types: Cigarettes  . Quit date: 12/24/1980  Smokeless Tobacco  . Never Used    Comment: quit in 1982    History  Alcohol Use No    Family History  Problem Relation Age  of Onset  . Heart failure Mother 60       congestive  . Heart attack Father 82  . Alcohol abuse Father   . Alzheimer's disease Brother 36  . Alzheimer's disease Sister 34  . Colon cancer Neg Hx   . Liver disease Neg Hx     Review of Systems: The review of systems is as noted in HPI. All other systems were reviewed and are negative.  Physical Exam: BP (!) 149/82   Pulse (!) 52   Ht 6\' 2"  (1.88 m)   Wt 273 lb (123.8 kg)   BMI 35.05 kg/m  He is an obese white male in no acute distress. HEENT: Normocephalic, atraumatic. Pupils equal round and reactive light accommodation. Extraocular movements are full. Oropharynx is clear. Neck is without JVD, adenopathy, thyromegaly, or bruits. Lungs: Clear Cardiovascular: Regular rate and rhythm. Normal S1 and S2. No gallop, murmur, or click. Abdomen: Morbidly obese, soft, nontender. Bowel sounds are positive. No masses. Extremities. no lower extremity edema. Wearing support hose. Chronic hyperpigmentation. Pulses are 2+. Neuro: Alert and oriented x3. Cranial nerves II through XII are intact.  LABORATORY DATA:  Lab Results  Component Value Date   WBC 5.6 08/21/2016   HGB 11.5 (L) 08/21/2016   HCT 35.3 (L) 08/21/2016   PLT 162 08/21/2016   GLUCOSE 91 12/25/2016   CHOL 94 (L) 08/21/2016   TRIG 31 08/21/2016   HDL 39 (L) 08/21/2016   LDLCALC 49 08/21/2016   ALT 8 08/21/2016   AST 11 08/21/2016   NA 146 (H) 12/25/2016   K 4.2 12/25/2016   CL 107 (H) 12/25/2016   CREATININE 1.21 12/25/2016   BUN 14 12/25/2016   CO2 28 12/25/2016   TSH 3.720 08/21/2016   PSA 1.6 04/14/2014   HGBA1C 6.0 (H) 08/21/2016      Assessment / Plan: 1. Marked sinus bradycardia.Improved off beta blocker therapy. No other arrhythmia noted.  2. Coronary disease-nonobstructive. Patient has some chronic stable angina. Continue risk factor modification. 3. Carotid arterial disease-nonobstructive by doppler this year.  4. Morbid obesity with obstructive sleep  apnea. I recommended increased aerobic activity.  5. Chronic left bundle branch block. 6. Chronic edema secondary to venous insufficiency. I recommended continued sodium restriction and use of compression hose. 7. HTN - BP a little high but I would not increase therapy with history of hypotension.  I'll followup again in one year.

## 2017-01-30 ENCOUNTER — Ambulatory Visit: Payer: PPO | Admitting: Cardiology

## 2017-02-06 ENCOUNTER — Ambulatory Visit (INDEPENDENT_AMBULATORY_CARE_PROVIDER_SITE_OTHER): Payer: PPO

## 2017-02-06 DIAGNOSIS — Z23 Encounter for immunization: Secondary | ICD-10-CM

## 2017-02-18 DIAGNOSIS — B351 Tinea unguium: Secondary | ICD-10-CM | POA: Diagnosis not present

## 2017-02-18 DIAGNOSIS — M79675 Pain in left toe(s): Secondary | ICD-10-CM | POA: Diagnosis not present

## 2017-02-18 DIAGNOSIS — M79674 Pain in right toe(s): Secondary | ICD-10-CM | POA: Diagnosis not present

## 2017-02-18 DIAGNOSIS — M2041 Other hammer toe(s) (acquired), right foot: Secondary | ICD-10-CM | POA: Diagnosis not present

## 2017-02-26 ENCOUNTER — Ambulatory Visit (INDEPENDENT_AMBULATORY_CARE_PROVIDER_SITE_OTHER): Payer: PPO | Admitting: Family Medicine

## 2017-02-26 DIAGNOSIS — Z23 Encounter for immunization: Secondary | ICD-10-CM

## 2017-03-02 NOTE — Progress Notes (Signed)
Vaccine only

## 2017-03-17 ENCOUNTER — Encounter: Payer: Self-pay | Admitting: Family Medicine

## 2017-03-17 ENCOUNTER — Ambulatory Visit (INDEPENDENT_AMBULATORY_CARE_PROVIDER_SITE_OTHER): Payer: PPO | Admitting: Family Medicine

## 2017-03-17 VITALS — BP 142/70 | HR 55 | Temp 98.5°F | Resp 16 | Wt 280.0 lb

## 2017-03-17 DIAGNOSIS — J069 Acute upper respiratory infection, unspecified: Secondary | ICD-10-CM

## 2017-03-17 MED ORDER — DOXYCYCLINE HYCLATE 100 MG PO TABS: 100.0000 mg | ORAL_TABLET | Freq: Two times a day (BID) | ORAL | 0 refills | Status: DC

## 2017-03-17 NOTE — Patient Instructions (Addendum)
Discussed use of expectorants like Mucinex and Delsym for cough. If increased shortness of breath or not improving over the week start the antibiotic

## 2017-03-17 NOTE — Progress Notes (Signed)
Subjective:     Patient ID: Jared Rice, male   DOB: July 26, 1932, 83 y.o.   MRN: [id number]  HPI  Chief Complaint  Patient presents with  . Cough    Patient comes in office today with concerns of cough and chest congestion for the past 5 days. Patient states symptoms initially started of as a scratchy throat and then moved into his chest. Patient reports that cough is productive of green sputum, he denies shortenss of breath or wheezing.   No fever or chills. Has not been using otc medication. Not on respiratory medication: "I just cough to clear out the sputum."   Review of Systems     Objective:   Physical Exam  Constitutional: He appears well-developed and well-nourished. No distress.  Ears: T.M's intact without inflammation Throat: no tonsillar enlargement or exudate Neck: no cervical adenopathy Lungs: clear     Assessment:    1. Viral upper respiratory tract infection - doxycycline (VIBRA-TABS) 100 MG tablet; Take 1 tablet (100 mg total) by mouth 2 (two) times daily.  Dispense: 14 tablet; Refill: 0    Plan:    Discussed use of expectorants and Delsym. Start abx if not improving or increased shortness of breath over the next week.

## 2017-03-21 ENCOUNTER — Other Ambulatory Visit: Payer: Self-pay | Admitting: Gastroenterology

## 2017-03-21 DIAGNOSIS — K253 Acute gastric ulcer without hemorrhage or perforation: Principal | ICD-10-CM

## 2017-03-21 DIAGNOSIS — B9681 Helicobacter pylori [H. pylori] as the cause of diseases classified elsewhere: Secondary | ICD-10-CM

## 2017-03-26 ENCOUNTER — Encounter: Payer: Self-pay | Admitting: Family Medicine

## 2017-03-26 ENCOUNTER — Ambulatory Visit (INDEPENDENT_AMBULATORY_CARE_PROVIDER_SITE_OTHER): Payer: PPO | Admitting: Family Medicine

## 2017-03-26 ENCOUNTER — Other Ambulatory Visit: Payer: Self-pay | Admitting: Family Medicine

## 2017-03-26 VITALS — BP 138/60 | HR 58 | Temp 97.8°F | Resp 18 | Wt 279.0 lb

## 2017-03-26 DIAGNOSIS — J40 Bronchitis, not specified as acute or chronic: Secondary | ICD-10-CM

## 2017-03-26 MED ORDER — AZITHROMYCIN 250 MG PO TABS: ORAL_TABLET | ORAL | 0 refills | Status: DC

## 2017-03-26 NOTE — Progress Notes (Signed)
Patient: Jared Rice Male    DOB: [birth year]   83 y.o.   MRN: [id number] Visit Date: 03/26/2017  Today's Provider: Wilhemena Durie, MD   Chief Complaint  Patient presents with  . Cough   Subjective:    HPI Pt is here for cough and congestion. He has a productive cough with green sputum. Denies shortness of breath or chest tightness. No sinus pressure or pain. Pt reports that he saw Mikki Santee last week and was given an antibiotic to take if he got worse. He got it filled but the pharmacist told him not to eat any diary products with it and he could not put it in his morning coffee. Pt has not taken the antibiotic. It was doxy.       Allergies  Allergen Reactions  . Dexilant [Dexlansoprazole] Rash  . Primidone Itching and Rash    rash     Current Outpatient Prescriptions:  .  aspirin 81 MG tablet, Take 81 mg by mouth daily., Disp: , Rfl:  .  diphenhydramine-acetaminophen (TYLENOL PM) 25-500 MG TABS, Take 2 tablets by mouth at bedtime as needed., Disp: , Rfl:  .  doxazosin (CARDURA) 8 MG tablet, Take 1 tablet by mouth once daily, Disp: 90 tablet, Rfl: 2 .  doxycycline (VIBRA-TABS) 100 MG tablet, Take 1 tablet (100 mg total) by mouth 2 (two) times daily., Disp: 14 tablet, Rfl: 0 .  furosemide (LASIX) 20 MG tablet, Take 1 tablet (20 mg total) by mouth daily., Disp: 180 tablet, Rfl: 3 .  levothyroxine (SYNTHROID, LEVOTHROID) 75 MCG tablet, Take 1 tablet by mouth daily in the morning, Disp: 90 tablet, Rfl: 3 .  pantoprazole (PROTONIX) 40 MG tablet, TAKE 1 TABLET BY MOUTH ONCE DAILY BEFORE BREAKFAST *SCHEDULE  FOLLOW  UP  APPOINTMENT*, Disp: 90 tablet, Rfl: 0 .  quinapril (ACCUPRIL) 20 MG tablet, Take 1 tablet (20 mg total) by mouth daily., Disp: 90 tablet, Rfl: 3 .  rosuvastatin (CRESTOR) 20 MG tablet, Take 1 tablet by mouth every day, Disp: 90 tablet, Rfl: 3 .  topiramate (TOPAMAX) 50 MG tablet, TAKE ONE TABLET BY MOUTH TWICE DAILY, Disp: 60 tablet, Rfl: 2  Review of Systems   Constitutional: Positive for fatigue.  HENT: Positive for rhinorrhea, sinus pain and sinus pressure.   Eyes: Negative.   Respiratory: Positive for cough and shortness of breath.   Gastrointestinal: Negative.   Endocrine: Negative.   Genitourinary: Negative.   Musculoskeletal: Negative.   Skin: Negative.   Allergic/Immunologic: Negative.   Neurological: Negative.   Hematological: Negative.   Psychiatric/Behavioral: Negative.     Social History  Substance Use Topics  . Smoking status: Former Smoker    Packs/day: 2.00    Years: 31.00    Types: Cigarettes    Quit date: 12/24/1980  . Smokeless tobacco: Never Used     Comment: quit in 1982  . Alcohol use No   Objective:   BP 138/60 (BP Location: Left Arm, Patient Position: Sitting, Cuff Size: Large)   Pulse (!) 58   Temp 97.8 F (36.6 C) (Oral)   Resp 18   Wt 279 lb (126.6 kg)   SpO2 95%   BMI 35.82 kg/m  Vitals:   03/26/17 0850  BP: 138/60  Pulse: (!) 58  Resp: 18  Temp: 97.8 F (36.6 C)  TempSrc: Oral  SpO2: 95%  Weight: 279 lb (126.6 kg)     Physical Exam  Constitutional: He is oriented to person, place,  and time. He appears well-developed and well-nourished.  HENT:  Head: Normocephalic and atraumatic.  Right Ear: External ear normal.  Left Ear: External ear normal.  Nose: Nose normal.  Mouth/Throat: Oropharynx is clear and moist.  Eyes: Pupils are equal, round, and reactive to light. Conjunctivae and EOM are normal.  Neck: Normal range of motion. Neck supple.  Cardiovascular: Normal rate, regular rhythm, normal heart sounds and intact distal pulses.   Pulmonary/Chest: Effort normal and breath sounds normal.  Neurological: He is alert and oriented to person, place, and time. He has normal reflexes.  Skin: Skin is warm and dry.  Psychiatric: He has a normal mood and affect. His behavior is normal. Judgment and thought content normal.        Assessment & Plan:     1. Bronchitis Use Robitussin. Call  if not better.  - azithromycin (ZITHROMAX Z-PAK) 250 MG tablet; Take 2 on the first day and then one daily until finished.  Dispense: 6 each; Refill: 0 2.Cough       HPI, Exam, and A&P Transcribed under the direction and in the presence of Erline Siddoway L. Cranford Mon, MD  Electronically Signed: Katina Dung, CMA  I have done the exam and reviewed the above chart and it is accurate to the best of my knowledge. Development worker, community has been used in this note in any air is in the dictation or transcription are unintentional.  Wilhemena Durie, MD  [place]

## 2017-04-07 DIAGNOSIS — L570 Actinic keratosis: Secondary | ICD-10-CM | POA: Diagnosis not present

## 2017-04-07 DIAGNOSIS — D692 Other nonthrombocytopenic purpura: Secondary | ICD-10-CM | POA: Diagnosis not present

## 2017-04-07 DIAGNOSIS — L578 Other skin changes due to chronic exposure to nonionizing radiation: Secondary | ICD-10-CM | POA: Diagnosis not present

## 2017-04-07 DIAGNOSIS — L821 Other seborrheic keratosis: Secondary | ICD-10-CM | POA: Diagnosis not present

## 2017-04-07 DIAGNOSIS — Z85828 Personal history of other malignant neoplasm of skin: Secondary | ICD-10-CM | POA: Diagnosis not present

## 2017-04-07 DIAGNOSIS — L720 Epidermal cyst: Secondary | ICD-10-CM | POA: Diagnosis not present

## 2017-04-07 DIAGNOSIS — L820 Inflamed seborrheic keratosis: Secondary | ICD-10-CM | POA: Diagnosis not present

## 2017-04-08 ENCOUNTER — Other Ambulatory Visit: Payer: Self-pay | Admitting: Family Medicine

## 2017-05-15 ENCOUNTER — Ambulatory Visit (INDEPENDENT_AMBULATORY_CARE_PROVIDER_SITE_OTHER): Payer: PPO

## 2017-05-15 VITALS — BP 138/66 | HR 60 | Temp 97.6°F | Ht 74.0 in | Wt 286.6 lb

## 2017-05-15 DIAGNOSIS — Z0000 Encounter for general adult medical examination without abnormal findings: Secondary | ICD-10-CM | POA: Diagnosis not present

## 2017-05-15 NOTE — Progress Notes (Signed)
Subjective:   Jared Rice is a 83 y.o. male who presents for Medicare Annual/Subsequent preventive examination.  Review of Systems:  N/A  Cardiac Risk Factors include: advanced age (>43men, >21 women);dyslipidemia;hypertension;male gender;obesity (BMI >30kg/m2)     Objective:    Vitals: BP 138/66 (BP Location: Right Arm)   Pulse 60   Temp 97.6 F (36.4 C) (Oral)   Ht 6\' 2"  (1.88 m)   Wt 286 lb 9.6 oz (130 kg)   BMI 36.80 kg/m   Body mass index is 36.8 kg/m.  Advanced Directives 05/15/2017 07/16/2016 06/22/2015 02/22/2015 01/05/2015 10/27/2014  Does Patient Have a Medical Advance Directive? Yes Yes Yes Yes No Yes  Type of Paramedic of [place];Living will Living will Living will;Healthcare Power of [place];Living will - [place];Living will  Does patient want to make changes to medical advance directive? - No - Patient declined - - - -  Copy of [place] in Chart? No - copy requested - - No - copy requested - No - copy requested  Would patient like information on creating a medical advance directive? - - - - Yes - Educational materials given -    Tobacco Social History   Tobacco Use  Smoking Status Former Smoker  . Packs/day: 2.00  . Years: 31.00  . Pack years: 62.00  . Types: Cigarettes  . Last attempt to quit: 12/24/1980  . Years since quitting: 36.4  Smokeless Tobacco Never Used  Tobacco Comment   quit in 1982     Counseling given: Not Answered Comment: quit in 1982   Clinical Intake:  Pre-visit preparation completed: Yes  Pain : 0-10 Pain Score: 2  Pain Location: Knee Pain Orientation: Right Pain Descriptors / Indicators: Aching     Nutritional Status: BMI > 30  Obese Nutritional Risks: None Diabetes: No CBG done?: No Did pt. bring in CBG monitor from home?: No  How often do you need to have someone help you when you read instructions, pamphlets, or other  written materials from your doctor or pharmacy?: 1 - Never  Interpreter Needed?: No  Information entered by :: [facility], LPN  Past Medical History:  Diagnosis Date  . Anginal pain ([place])   . Chronic airway obstruction, not elsewhere classified   . Colon polyps   . Coronary atherosclerosis of native coronary artery    nonobstructive  . Essential hypertension, benign   . Glucose intolerance (impaired glucose tolerance)   . Hypothyroidism   . Iron deficiency anemia   . Knee pain, right   . Occlusion and stenosis of carotid artery without mention of cerebral infarction    wears compression stockings  . Other dyspnea and respiratory abnormality    w/ pseuodowheeze resolves with purse lip manuever  . Precordial pain   . Shoulder pain, right   . Tremor   . Unspecified sleep apnea   . Venous insufficiency   . Wears dentures    full upper   Past Surgical History:  Procedure Laterality Date  . CARDIAC CATHETERIZATION    . COLONOSCOPY  03/17/12   Dr Byrnett-diverticulosis  . ESOPHAGOGASTRODUODENOSCOPY (EGD) WITH PROPOFOL N/A 11/25/2014   Procedure: ESOPHAGOGASTRODUODENOSCOPY (EGD) WITH PROPOFOL;  Surgeon: Lucilla Lame, MD;  Location: [facility];  Service: Endoscopy;  Laterality: N/A;  with biopsy  . ESOPHAGOGASTRODUODENOSCOPY (EGD) WITH PROPOFOL N/A 01/05/2015   Procedure: ESOPHAGOGASTRODUODENOSCOPY (EGD) WITH PROPOFOL;  Surgeon: Lucilla Lame, MD;  Location: [facility];  Service: Endoscopy;  Laterality: N/A;  . EYE SURGERY Bilateral    cataract   . TOTAL KNEE ARTHROPLASTY Left    Family History  Problem Relation Age of Onset  . Heart failure Mother 36       congestive  . Heart attack Father 79  . Alcohol abuse Father   . Alzheimer's disease Brother 39  . Alzheimer's disease Sister 82  . Colon cancer Neg Hx   . Liver disease Neg Hx    Social History   Socioeconomic History  . Marital status: Married    Spouse name: None  . Number of children: 4  . Years of  education: None  . Highest education level: None  Social Needs  . Financial resource strain: Not hard at all  . Food insecurity - worry: Never true  . Food insecurity - inability: Never true  . Transportation needs - medical: No  . Transportation needs - non-medical: No  Occupational History  . Occupation: retired    Comment: [employer]  . Smoking status: Former Smoker    Packs/day: 2.00    Years: 31.00    Pack years: 62.00    Types: Cigarettes    Last attempt to quit: 12/24/1980    Years since quitting: 36.4  . Smokeless tobacco: Never Used  . Tobacco comment: quit in 1982  Substance and Sexual Activity  . Alcohol use: No    Alcohol/week: 0.0 oz  . Drug use: No  . Sexual activity: None  Other Topics Concern  . None  Social History Narrative   Lives with wife, retired Engineer, drilling, 2 children, 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    Outpatient Encounter Medications as of 05/15/2017  Medication Sig  . aspirin 81 MG tablet Take 81 mg by mouth daily.  . diphenhydramine-acetaminophen (TYLENOL PM) 25-500 MG TABS Take 2 tablets by mouth at bedtime as needed.  . doxazosin (CARDURA) 8 MG tablet Take 1 tablet by mouth once daily  . furosemide (LASIX) 20 MG tablet Take 1 tablet (20 mg total) by mouth daily.  [name] levothyroxine (SYNTHROID, LEVOTHROID) 75 MCG tablet Take 1 tablet by mouth daily in the morning  . pantoprazole (PROTONIX) 40 MG tablet TAKE 1 TABLET BY MOUTH ONCE DAILY BEFORE BREAKFAST *SCHEDULE  FOLLOW  UP  APPOINTMENT*  . quinapril (ACCUPRIL) 20 MG tablet Take 1 tablet (20 mg total) by mouth daily.  . rosuvastatin (CRESTOR) 20 MG tablet Take 1 tablet by mouth every day  . topiramate (TOPAMAX) 50 MG tablet TAKE 1 TABLET BY MOUTH TWICE DAILY  . [DISCONTINUED] azithromycin (ZITHROMAX Z-PAK) 250 MG tablet Take 2 on the first day and then one daily until finished.  . [DISCONTINUED] doxycycline (VIBRA-TABS) 100 MG tablet Take 1 tablet (100 mg total) by mouth  2 (two) times daily. (Patient not taking: Reported on 03/26/2017)   No facility-administered encounter medications on file as of 05/15/2017.     Activities of Daily Living In your present state of health, do you have any difficulty performing the following activities: 05/15/2017  Hearing? N  Vision? N  Difficulty concentrating or making decisions? N  Walking or climbing stairs? Y  Comment due to knee pains and pains from exertions  Dressing or bathing? N  Doing errands, shopping? N  Preparing Food and eating ? N  Using the Toilet? N  In the past six months, have you accidently leaked urine? Y  Comment occasionally, does not have to wear protection  Do you have problems with  loss of bowel control? N  Managing your Medications? N  Managing your Finances? N  Housekeeping or managing your Housekeeping? N  Some recent data might be hidden    Patient Care Team: Jerrol Banana., MD as PCP - General (Unknown Physician Specialty) Estill [name], MD as Consulting Physician (Ophthalmology) [place], Peter M, MD as Consulting Physician (Cardiology) Elvina Mattes, Adele Schilder as Attending Physician (Podiatry)   Assessment:   This is a routine wellness examination for Gloria.  Exercise Activities and Dietary recommendations Current Exercise Habits: The patient does not participate in regular exercise at present, Exercise limited by: None identified  Goals    . Exercise 3x per week (30 min per time)     Recommend to start exercising 3 times a week for 30 minutes at a time.        Fall Risk Fall Risk  05/15/2017 05/14/2016 05/10/2015  Falls in the past year? Yes No No  Number falls in past yr: 2 or more - -  Injury with Fall? No - -  Follow up Falls prevention discussed - -   Is the patient's home free of loose throw rugs in walkways, pet beds, electrical cords, etc?   yes      Grab bars in the bathroom? yes      Handrails on the stairs?   yes      Adequate lighting?    yes  Timed Get Up and Go Performed: N/A  Depression Screen PHQ 2/9 Scores 05/15/2017 05/14/2016 05/10/2015  PHQ - 2 Score 0 0 0    Cognitive Function      6CIT Screen 05/15/2017  What Year? 0 points  What month? 0 points  What time? 0 points  Count back from 20 0 points  Months in reverse 0 points  Repeat phrase 0 points  Total Score 0    Immunization History  Administered Date(s) Administered  . Influenza Split 02/20/2011, 02/27/2012  . Influenza, High Dose Seasonal PF 02/22/2014, 02/18/2015, 02/29/2016, 02/26/2017  . Influenza,inj,Quad PF,6+ Mos 02/13/2013  . Influenza-Unspecified 02/24/2013  . Pneumococcal Conjugate-13 04/14/2014  . Pneumococcal Polysaccharide-23 03/25/1997, 04/05/2003  . Td 08/29/2003  . Tdap 02/20/2011  . Zoster 05/12/2007  . Zoster Recombinat (Shingrix) 02/26/2017    Qualifies for Shingles Vaccine? Yes, pt has received the first dose on 02/26/17. Pt would like receive the second dose after the holidays at the next OV on 05/21/17.  Screening Tests Health Maintenance  Topic Date Due  . FOOT EXAM  11/06/1942  . OPHTHALMOLOGY EXAM  11/06/1942  . HEMOGLOBIN A1C  02/21/2017  . TETANUS/TDAP  02/19/2021  . INFLUENZA VACCINE  Completed  . PNA vac Low Risk Adult  Completed   Cancer Screenings: Lung: Low Dose CT Chest recommended if Age 86-80 years, 30 pack-year currently smoking OR have quit w/in 15years. Patient does not qualify. Colorectal: Up to date  Additional Screenings:  Hepatitis B/HIV/Syphillis: Pt declines today.  Hepatitis C Screening: Pt declines today.     Plan:  I have personally reviewed and addressed the Medicare Annual Wellness questionnaire and have noted the following in the patient's chart:  A. Medical and social history B. Use of alcohol, tobacco or illicit drugs  C. Current medications and supplements D. Functional ability and status E.  Nutritional status F.  Physical activity G. Advance directives H. List of other  physicians I.  Hospitalizations, surgeries, and ER visits in previous 12 months J.  [place] such as hearing and vision if needed,  cognitive and depression L. Referrals and appointments - none  In addition, I have reviewed and discussed with patient certain preventive protocols, quality metrics, and best practice recommendations. A written personalized care plan for preventive services as well as general preventive health recommendations were provided to patient.  See attached scanned questionnaire for additional information.   Signed,  Fabio Neighbors, LPN Nurse Health Advisor   Nurse Recommendations: Pt needs a diabetic foot exam and Hgb A1c checked at next OV. Pt is going to schedule an eye exam in 2019. Requested a copy of the exam once completed. Pt to received the second dose of Shingrix at next OV (waiting until after the holidays).

## 2017-05-15 NOTE — Patient Instructions (Addendum)
Jared Rice , Thank you for taking time to come for your Medicare Wellness Visit. I appreciate your ongoing commitment to your health goals. Please review the following plan we discussed and let me know if I can assist you in the future.   Screening recommendations/referrals: Colonoscopy: Up to date Recommended yearly ophthalmology/optometry visit for glaucoma screening and checkup Recommended yearly dental visit for hygiene and checkup  Vaccinations: Influenza vaccine: Up to date Pneumococcal vaccine: Up to date Tdap vaccine: Up to date Shingles vaccine: Up to date    Advanced directives: Please bring a copy of your POA (Power of [place]) and/or Living Will to your next appointment.   Conditions/risks identified: Fall risk prevention; Obesity- recommend to start exercising 3 times a week for 30 minutes at a time.   Next appointment: 05/21/17 @ 2:00 PM  Preventive Care 65 Years and Older, Male Preventive care refers to lifestyle choices and visits with your health care provider that can promote health and wellness. What does preventive care include?  A yearly physical exam. This is also called an annual well check.  Dental exams once or twice a year.  Routine eye exams. Ask your health care provider how often you should have your eyes checked.  Personal lifestyle choices, including:  Daily care of your teeth and gums.  Regular physical activity.  Eating a healthy diet.  Avoiding tobacco and drug use.  Limiting alcohol use.  Practicing safe sex.  Taking low doses of aspirin every day.  Taking vitamin and mineral supplements as recommended by your health care provider. What happens during an annual well check? The services and screenings done by your health care provider during your annual well check will depend on your age, overall health, lifestyle risk factors, and family history of disease. Counseling  Your health care provider may ask you questions about  your:  Alcohol use.  Tobacco use.  Drug use.  Emotional well-being.  Home and relationship well-being.  Sexual activity.  Eating habits.  History of falls.  Memory and ability to understand (cognition).  Work and work Statistician. Screening  You may have the following tests or measurements:  Height, weight, and BMI.  Blood pressure.  Lipid and cholesterol levels. These may be checked every 5 years, or more frequently if you are over 77 years old.  Skin check.  Lung cancer screening. You may have this screening every year starting at age 76 if you have a 30-pack-year history of smoking and currently smoke or have quit within the past 15 years.  Fecal occult blood test (FOBT) of the stool. You may have this test every year starting at age 73.  Flexible sigmoidoscopy or colonoscopy. You may have a sigmoidoscopy every 5 years or a colonoscopy every 10 years starting at age 58.  Prostate cancer screening. Recommendations will vary depending on your family history and other risks.  Hepatitis C blood test.  Hepatitis B blood test.  Sexually transmitted disease (STD) testing.  Diabetes screening. This is done by checking your blood sugar (glucose) after you have not eaten for a while (fasting). You may have this done every 1-3 years.  Abdominal aortic aneurysm (AAA) screening. You may need this if you are a current or former smoker.  Osteoporosis. You may be screened starting at age 55 if you are at high risk. Talk with your health care provider about your test results, treatment options, and if necessary, the need for more tests. Vaccines  Your health care provider may recommend  certain vaccines, such as:  Influenza vaccine. This is recommended every year.  Tetanus, diphtheria, and acellular pertussis (Tdap, Td) vaccine. You may need a Td booster every 10 years.  Zoster vaccine. You may need this after age 40.  Pneumococcal 13-valent conjugate (PCV13) vaccine.  One dose is recommended after age 73.  Pneumococcal polysaccharide (PPSV23) vaccine. One dose is recommended after age 47. Talk to your health care provider about which screenings and vaccines you need and how often you need them. This information is not intended to replace advice given to you by your health care provider. Make sure you discuss any questions you have with your health care provider. Document Released: 06/09/2015 Document Revised: 01/31/2016 Document Reviewed: 03/14/2015 Elsevier Interactive Patient Education  2017 [place] Prevention in the Home Falls can cause injuries. They can happen to people of all ages. There are many things you can do to make your home safe and to help prevent falls. What can I do on the outside of my home?  Regularly fix the edges of walkways and driveways and fix any cracks.  Remove anything that might make you trip as you walk through a door, such as a raised step or threshold.  Trim any bushes or trees on the path to your home.  Use bright outdoor lighting.  Clear any walking paths of anything that might make someone trip, such as rocks or tools.  Regularly check to see if handrails are loose or broken. Make sure that both sides of any steps have handrails.  Any raised decks and porches should have guardrails on the edges.  Have any leaves, snow, or ice cleared regularly.  Use sand or salt on walking paths during winter.  Clean up any spills in your garage right away. This includes oil or grease spills. What can I do in the bathroom?  Use night lights.  Install grab bars by the toilet and in the tub and shower. Do not use towel bars as grab bars.  Use non-skid mats or decals in the tub or shower.  If you need to sit down in the shower, use a plastic, non-slip stool.  Keep the floor dry. Clean up any water that spills on the floor as soon as it happens.  Remove soap buildup in the tub or shower regularly.  Attach bath  mats securely with double-sided non-slip rug tape.  Do not have throw rugs and other things on the floor that can make you trip. What can I do in the bedroom?  Use night lights.  Make sure that you have a light by your bed that is easy to reach.  Do not use any sheets or blankets that are too big for your bed. They should not hang down onto the floor.  Have a firm chair that has side arms. You can use this for support while you get dressed.  Do not have throw rugs and other things on the floor that can make you trip. What can I do in the kitchen?  Clean up any spills right away.  Avoid walking on wet floors.  Keep items that you use a lot in easy-to-reach places.  If you need to reach something above you, use a strong step stool that has a grab bar.  Keep electrical cords out of the way.  Do not use floor polish or wax that makes floors slippery. If you must use wax, use non-skid floor wax.  Do not have throw rugs and other  things on the floor that can make you trip. What can I do with my stairs?  Do not leave any items on the stairs.  Make sure that there are handrails on both sides of the stairs and use them. Fix handrails that are broken or loose. Make sure that handrails are as long as the stairways.  Check any carpeting to make sure that it is firmly attached to the stairs. Fix any carpet that is loose or worn.  Avoid having throw rugs at the top or bottom of the stairs. If you do have throw rugs, attach them to the floor with carpet tape.  Make sure that you have a light switch at the top of the stairs and the bottom of the stairs. If you do not have them, ask someone to add them for you. What else can I do to help prevent falls?  Wear shoes that:  Do not have high heels.  Have rubber bottoms.  Are comfortable and fit you well.  Are closed at the toe. Do not wear sandals.  If you use a stepladder:  Make sure that it is fully opened. Do not climb a closed  stepladder.  Make sure that both sides of the stepladder are locked into place.  Ask someone to hold it for you, if possible.  Clearly mark and make sure that you can see:  Any grab bars or handrails.  First and last steps.  Where the edge of each step is.  Use tools that help you move around (mobility aids) if they are needed. These include:  Canes.  Walkers.  Scooters.  Crutches.  Turn on the lights when you go into a dark area. Replace any light bulbs as soon as they burn out.  Set up your furniture so you have a clear path. Avoid moving your furniture around.  If any of your floors are uneven, fix them.  If there are any pets around you, be aware of where they are.  Review your medicines with your doctor. Some medicines can make you feel dizzy. This can increase your chance of falling. Ask your doctor what other things that you can do to help prevent falls. This information is not intended to replace advice given to you by your health care provider. Make sure you discuss any questions you have with your health care provider. Document Released: 03/09/2009 Document Revised: 10/19/2015 Document Reviewed: 06/17/2014 Elsevier Interactive Patient Education  2017 [employer].

## 2017-05-21 ENCOUNTER — Ambulatory Visit (INDEPENDENT_AMBULATORY_CARE_PROVIDER_SITE_OTHER): Payer: PPO | Admitting: Family Medicine

## 2017-05-21 ENCOUNTER — Encounter: Payer: Self-pay | Admitting: Family Medicine

## 2017-05-21 ENCOUNTER — Telehealth: Payer: Self-pay | Admitting: Emergency Medicine

## 2017-05-21 VITALS — BP 152/68 | HR 52 | Temp 97.6°F | Resp 16 | Ht 74.0 in | Wt 286.0 lb

## 2017-05-21 DIAGNOSIS — Z0000 Encounter for general adult medical examination without abnormal findings: Secondary | ICD-10-CM | POA: Diagnosis not present

## 2017-05-21 DIAGNOSIS — R600 Localized edema: Secondary | ICD-10-CM | POA: Diagnosis not present

## 2017-05-21 DIAGNOSIS — L03119 Cellulitis of unspecified part of limb: Secondary | ICD-10-CM | POA: Diagnosis not present

## 2017-05-21 DIAGNOSIS — M7989 Other specified soft tissue disorders: Secondary | ICD-10-CM

## 2017-05-21 MED ORDER — FUROSEMIDE 40 MG PO TABS: 40.0000 mg | ORAL_TABLET | Freq: Every day | ORAL | Status: DC

## 2017-05-21 MED ORDER — DOXYCYCLINE HYCLATE 100 MG PO TABS: 100.0000 mg | ORAL_TABLET | Freq: Two times a day (BID) | ORAL | 0 refills | Status: DC

## 2017-05-21 NOTE — Progress Notes (Signed)
Patient: Jared Rice, Male    DOB: [birth year], 83 y.o.   MRN: [id number] Visit Date: 05/21/2017  Today's Provider: Wilhemena Durie, MD   Chief Complaint  Patient presents with  . Annual Exam  . Leg Pain   Subjective:    Annual physical exam Jared Rice is a 83 y.o. male who presents today for health maintenance and complete physical. He feels well. He reports he is not exercising. He reports he is sleeping well with the help of 2 tylenol PM.  -----------------------------------------------------------------  Colonoscopy- 03/17/12 Diverticulosis, otherwise normal   Review of Systems  Constitutional: Negative.   HENT: Negative.   Eyes: Negative.   Respiratory: Negative.   Cardiovascular: Positive for leg swelling.  Gastrointestinal: Negative.  Negative for abdominal distention.  Endocrine: Negative.   Genitourinary: Negative.   Musculoskeletal: Positive for myalgias.  Skin: Negative.   Allergic/Immunologic: Negative.   Neurological: Negative.   Hematological: Negative.   Psychiatric/Behavioral: Negative.     Social History      He  reports that he quit smoking about 36 years ago. His smoking use included cigarettes. He has a 62.00 pack-year smoking history. he has never used smokeless tobacco. He reports that he does not drink alcohol or use drugs.       Social History   Socioeconomic History  . Marital status: Married    Spouse name: None  . Number of children: 4  . Years of education: None  . Highest education level: None  Social Needs  . Financial resource strain: Not hard at all  . Food insecurity - worry: Never true  . Food insecurity - inability: Never true  . Transportation needs - medical: No  . Transportation needs - non-medical: No  Occupational History  . Occupation: retired    Comment: [employer]  . Smoking status: Former Smoker    Packs/day: 2.00    Years: 31.00    Pack years: 62.00    Types: Cigarettes    Last attempt to quit: 12/24/1980    Years since quitting: 36.4  . Smokeless tobacco: Never Used  . Tobacco comment: quit in 1982  Substance and Sexual Activity  . Alcohol use: No    Alcohol/week: 0.0 oz  . Drug use: No  . Sexual activity: None  Other Topics Concern  . None  Social History Narrative   Lives with wife, retired Engineer, drilling, 2 children, 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    Past Medical History:  Diagnosis Date  . Anginal pain ([place])   . Chronic airway obstruction, not elsewhere classified   . Colon polyps   . Coronary atherosclerosis of native coronary artery    nonobstructive  . Essential hypertension, benign   . Glucose intolerance (impaired glucose tolerance)   . Hypothyroidism   . Iron deficiency anemia   . Knee pain, right   . Occlusion and stenosis of carotid artery without mention of cerebral infarction    wears compression stockings  . Other dyspnea and respiratory abnormality    w/ pseuodowheeze resolves with purse lip manuever  . Precordial pain   . Shoulder pain, right   . Tremor   . Unspecified sleep apnea   . Venous insufficiency   . Wears dentures    full upper     Patient Active Problem List   Diagnosis Date Noted  . Chronic fatigue 11/29/2015  . BPH (benign prostatic hypertrophy) 02/22/2015  . Gastrointestinal ulcer   . History  of peptic ulcer   . Gastritis   . Hematochezia   . Acute gastric ulcer   . Melena 11/12/2014  . Malignant neoplasm of skin 09/21/2014  . Cataract 09/21/2014  . Colon polyp 09/21/2014  . CAFL (chronic airflow limitation) ([place]) 09/21/2014  . Diabetes ([place]) 09/21/2014  . DD (diverticular disease) 09/21/2014  . Benign essential tremor 09/21/2014  . Personal history of tobacco use, presenting hazards to health 09/21/2014  . Acid reflux 09/21/2014  . Hemorrhoids 09/21/2014  . 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 HLD (hyperlipidemia) 09/21/2014  . BP (high blood pressure) 09/21/2014  . Adult  hypothyroidism 09/21/2014  . Lymphoma ([place]) 09/21/2014  . Malaise and fatigue 09/21/2014  . Mononeuritis 09/21/2014  . Muscle ache 09/21/2014  . Neuropathy 09/21/2014  . Numbness and tingling 09/21/2014  . Arthritis, degenerative 09/21/2014  . Adiposity 09/21/2014  . Awareness of heartbeats 09/21/2014  . Borderline diabetes 09/21/2014  . Acne erythematosa 09/21/2014  . Rotator cuff syndrome 09/21/2014  . Athlete's foot 09/21/2014  . Has a tremor 09/21/2014  . Stasis, venous 09/21/2014  . Avitaminosis D 09/21/2014  . Arthritis of knee, degenerative 11/16/2013  . Bradycardia, sinus 12/31/2011  . Left bundle branch block 12/31/2011  . First degree AV block 12/31/2011  . Edema of both legs 12/31/2011  . COPD UNSPECIFIED 11/09/2009  . HYPERTENSION, BENIGN 10/03/2009  . SLEEP APNEA 10/03/2009  . DYSPNEA 10/03/2009  . CAD, NATIVE VESSEL 05/04/2009  . Carotid disease, bilateral ([place]) 05/04/2009    Past Surgical History:  Procedure Laterality Date  . CARDIAC CATHETERIZATION    . COLONOSCOPY  03/17/12   Dr Byrnett-diverticulosis  . ESOPHAGOGASTRODUODENOSCOPY (EGD) WITH PROPOFOL N/A 11/25/2014   Procedure: ESOPHAGOGASTRODUODENOSCOPY (EGD) WITH PROPOFOL;  Surgeon: Lucilla Lame, MD;  Location: [facility];  Service: Endoscopy;  Laterality: N/A;  with biopsy  . ESOPHAGOGASTRODUODENOSCOPY (EGD) WITH PROPOFOL N/A 01/05/2015   Procedure: ESOPHAGOGASTRODUODENOSCOPY (EGD) WITH PROPOFOL;  Surgeon: Lucilla Lame, MD;  Location: [facility];  Service: Endoscopy;  Laterality: N/A;  . EYE SURGERY Bilateral    cataract   . TOTAL KNEE ARTHROPLASTY Left     Family History        Family Status  Relation Name Status  . Mother  Deceased at age 54  . Father  Deceased at age 26  . Brother  Deceased  . Sister  Deceased  . Sister  Alive  . Sister  Deceased  . MGM  Deceased  . MGF  Deceased  . PGM  Deceased  . PGF  Deceased  . Neg Hx  (Not Specified)        His family history  includes Alcohol abuse in his father; Alzheimer's disease (age of onset: 67) in his brother; Alzheimer's disease (age of onset: 64) in his sister; Heart attack (age of onset: 24) in his father; Heart failure (age of onset: 2) in his mother.     Allergies  Allergen Reactions  . Dexilant [Dexlansoprazole] Rash  . Primidone Itching and Rash    rash     Current Outpatient Medications:  .  aspirin 81 MG tablet, Take 81 mg by mouth daily., Disp: , Rfl:  .  diphenhydramine-acetaminophen (TYLENOL PM) 25-500 MG TABS, Take 2 tablets by mouth at bedtime as needed., Disp: , Rfl:  .  doxazosin (CARDURA) 8 MG tablet, Take 1 tablet by mouth once daily, Disp: 90 tablet, Rfl: 2 .  furosemide (LASIX) 20 MG tablet, Take 1 tablet (20 mg total) by mouth daily., Disp: 180  tablet, Rfl: 3 .  levothyroxine (SYNTHROID, LEVOTHROID) 75 MCG tablet, Take 1 tablet by mouth daily in the morning, Disp: 90 tablet, Rfl: 3 .  pantoprazole (PROTONIX) 40 MG tablet, TAKE 1 TABLET BY MOUTH ONCE DAILY BEFORE BREAKFAST *SCHEDULE  FOLLOW  UP  APPOINTMENT*, Disp: 90 tablet, Rfl: 0 .  quinapril (ACCUPRIL) 20 MG tablet, Take 1 tablet (20 mg total) by mouth daily., Disp: 90 tablet, Rfl: 3 .  rosuvastatin (CRESTOR) 20 MG tablet, Take 1 tablet by mouth every day, Disp: 90 tablet, Rfl: 3 .  topiramate (TOPAMAX) 50 MG tablet, TAKE 1 TABLET BY MOUTH TWICE DAILY, Disp: 60 tablet, Rfl: 5   Patient Care Team: Jerrol Banana., MD as PCP - General (Unknown Physician Specialty) Dingeldein, Remo Lipps, MD as Consulting Physician (Ophthalmology) Martinique, Peter M, MD as Consulting Physician (Cardiology) Troxler, Rodman Key, DPM as Attending Physician (Podiatry)      Objective:   Vitals: BP (!) 152/68 (BP Location: Right Arm, Patient Position: Sitting, Cuff Size: Large)   Pulse (!) 52   Temp 97.6 F (36.4 C) (Oral)   Resp 16   Ht 6\' 2"  (1.88 m)   Wt 286 lb (129.7 kg)   BMI 36.72 kg/m    Vitals:   05/21/17 1410  BP: (!) 152/68  Pulse:  (!) 52  Resp: 16  Temp: 97.6 F (36.4 C)  TempSrc: Oral  Weight: 286 lb (129.7 kg)  Height: 6\' 2"  (1.88 m)     Physical Exam  Constitutional: He is oriented to person, place, and time. He appears well-developed and well-nourished.  HENT:  Head: Normocephalic and atraumatic.  Right Ear: External ear normal.  Left Ear: External ear normal.  Nose: Nose normal.  Mouth/Throat: Oropharynx is clear and moist.  Eyes: Conjunctivae are normal. No scleral icterus.  Neck: Neck supple. No thyromegaly present.  Cardiovascular: Normal rate, regular rhythm, normal heart sounds and intact distal pulses.  Pulmonary/Chest: Effort normal and breath sounds normal.  Abdominal: Soft.  Musculoskeletal: He exhibits edema and tenderness.  Both legs tender--no cords but both swollen to 19 inches. Neg Homans.  Lymphadenopathy:    He has no cervical adenopathy.  Neurological: He is alert and oriented to person, place, and time.  Skin: Skin is warm and dry.  Psychiatric: He has a normal mood and affect. His behavior is normal. Judgment and thought content normal.     Depression Screen PHQ 2/9 Scores 05/15/2017 05/14/2016 05/10/2015  PHQ - 2 Score 0 0 0      Assessment & Plan:     Routine Health Maintenance and Physical Exam  Exercise Activities and Dietary recommendations Goals    . Exercise 3x per week (30 min per time)     Recommend to start exercising 3 times a week for 30 minutes at a time.        Immunization History  Administered Date(s) Administered  . Influenza Split 02/20/2011, 02/27/2012  . Influenza, High Dose Seasonal PF 02/22/2014, 02/18/2015, 02/29/2016, 02/26/2017  . Influenza,inj,Quad PF,6+ Mos 02/13/2013  . Influenza-Unspecified 02/24/2013  . Pneumococcal Conjugate-13 04/14/2014  . Pneumococcal Polysaccharide-23 03/25/1997, 04/05/2003  . Td 08/29/2003  . Tdap 02/20/2011  . Zoster 05/12/2007  . Zoster Recombinat (Shingrix) 02/26/2017    Health Maintenance  Topic  Date Due  . FOOT EXAM  11/06/1942  . OPHTHALMOLOGY EXAM  11[birth year]06/1942  . HEMOGLOBIN A1C  02/21/2017  . TETANUS/TDAP  02/19/2021  . INFLUENZA VACCINE  Completed  . PNA vac Low Risk Adult  Completed  Discussed health benefits of physical activity, and encouraged him to engage in regular exercise appropriate for his age and condition.  R/o DVT Dopplers of LE.   I have done the exam and reviewed the chart and it is accurate to the best of my knowledge. Development worker, community has been used and  any errors in dictation or transcription are unintentional. [name] M.D. [facility]  --------------------------------------------------------------------    Wilhemena Durie, MD  [facility]

## 2017-05-21 NOTE — Telephone Encounter (Signed)
I placed orders for dopplers on Bilateral legs. Due to leg swelling and pain. I know these dont fall into your que. Thanks! :)

## 2017-05-21 NOTE — Patient Instructions (Signed)
Double lasix to 40 mg and someone from out office will call you about scheduling your ultrasound.

## 2017-05-22 ENCOUNTER — Ambulatory Visit
Admission: RE | Admit: 2017-05-22 | Discharge: 2017-05-22 | Disposition: A | Discharge status: Home / Self Care | Payer: PPO | Source: Ambulatory Visit | Attending: Family Medicine | Admitting: Family Medicine

## 2017-05-22 ENCOUNTER — Telehealth: Payer: Self-pay | Admitting: Family Medicine

## 2017-05-22 DIAGNOSIS — R600 Localized edema: Secondary | ICD-10-CM | POA: Insufficient documentation

## 2017-05-22 DIAGNOSIS — M7989 Other specified soft tissue disorders: Secondary | ICD-10-CM | POA: Insufficient documentation

## 2017-05-22 DIAGNOSIS — L03119 Cellulitis of unspecified part of limb: Secondary | ICD-10-CM

## 2017-05-22 DIAGNOSIS — M79609 Pain in unspecified limb: Secondary | ICD-10-CM | POA: Diagnosis not present

## 2017-05-22 MED ORDER — AMOXICILLIN-POT CLAVULANATE 875-125 MG PO TABS: 1.0000 | ORAL_TABLET | Freq: Two times a day (BID) | ORAL | 0 refills | Status: DC

## 2017-05-22 NOTE — Telephone Encounter (Signed)
Augmentin

## 2017-05-22 NOTE — Telephone Encounter (Signed)
Pt states that he was prescribed doxycycline and this causes upset stomach for him.Can something different be sent to [facility].?

## 2017-05-22 NOTE — Telephone Encounter (Signed)
[facility] wanted to know if you are trying to rule out DVT.If so please mark as stat

## 2017-05-22 NOTE — Telephone Encounter (Signed)
Please advise-Jared Rice V Kevin Mario, RMA  

## 2017-05-22 NOTE — Telephone Encounter (Signed)
Pt advised med sent to pharmacy

## 2017-06-04 ENCOUNTER — Encounter: Payer: Self-pay | Admitting: Family Medicine

## 2017-06-04 ENCOUNTER — Ambulatory Visit (INDEPENDENT_AMBULATORY_CARE_PROVIDER_SITE_OTHER): Payer: PPO | Admitting: Family Medicine

## 2017-06-04 VITALS — BP 116/60 | HR 80 | Temp 97.4°F | Resp 16 | Wt 286.0 lb

## 2017-06-04 DIAGNOSIS — Z6836 Body mass index (BMI) 36.0-36.9, adult: Secondary | ICD-10-CM | POA: Diagnosis not present

## 2017-06-04 DIAGNOSIS — L03119 Cellulitis of unspecified part of limb: Secondary | ICD-10-CM

## 2017-06-04 DIAGNOSIS — R600 Localized edema: Secondary | ICD-10-CM | POA: Diagnosis not present

## 2017-06-04 DIAGNOSIS — R7303 Prediabetes: Secondary | ICD-10-CM | POA: Diagnosis not present

## 2017-06-04 DIAGNOSIS — I10 Essential (primary) hypertension: Secondary | ICD-10-CM

## 2017-06-04 MED ORDER — AMOXICILLIN-POT CLAVULANATE 875-125 MG PO TABS: 1.0000 | ORAL_TABLET | Freq: Two times a day (BID) | ORAL | 0 refills | Status: DC

## 2017-06-04 NOTE — Progress Notes (Signed)
Patient: Jared Rice Male    DOB: [birth year]   84 y.o.   MRN: [id number] Visit Date: 06/04/2017  Today's Provider: Wilhemena Durie, MD   Chief Complaint  Patient presents with  . Cellulitis   Subjective:    HPI Pt was seen on 05/21/17 for cellulitis. Started Doxy and increased furosemide to 40 mg daily. Checked doppler bilateral legs negative for DVT. Pt called back, and we changed antibiotic to Augmentin. He reports that his legs are better, they are not as tender but they are still a little tender. The swelling is better.       Allergies  Allergen Reactions  . Dexilant [Dexlansoprazole] Rash  . Primidone Itching and Rash    rash     Current Outpatient Medications:  .  aspirin 81 MG tablet, Take 81 mg by mouth daily., Disp: , Rfl:  .  diphenhydramine-acetaminophen (TYLENOL PM) 25-500 MG TABS, Take 2 tablets by mouth at bedtime as needed., Disp: , Rfl:  .  doxazosin (CARDURA) 8 MG tablet, Take 1 tablet by mouth once daily, Disp: 90 tablet, Rfl: 2 .  furosemide (LASIX) 40 MG tablet, Take 1 tablet (40 mg total) by mouth daily., Disp: , Rfl:  .  levothyroxine (SYNTHROID, LEVOTHROID) 75 MCG tablet, Take 1 tablet by mouth daily in the morning, Disp: 90 tablet, Rfl: 3 .  pantoprazole (PROTONIX) 40 MG tablet, TAKE 1 TABLET BY MOUTH ONCE DAILY BEFORE BREAKFAST *SCHEDULE  FOLLOW  UP  APPOINTMENT*, Disp: 90 tablet, Rfl: 0 .  quinapril (ACCUPRIL) 20 MG tablet, Take 1 tablet (20 mg total) by mouth daily., Disp: 90 tablet, Rfl: 3 .  rosuvastatin (CRESTOR) 20 MG tablet, Take 1 tablet by mouth every day, Disp: 90 tablet, Rfl: 3 .  topiramate (TOPAMAX) 50 MG tablet, TAKE 1 TABLET BY MOUTH TWICE DAILY, Disp: 60 tablet, Rfl: 5 .  amoxicillin-clavulanate (AUGMENTIN) 875-125 MG tablet, Take 1 tablet by mouth 2 (two) times daily. (Patient not taking: Reported on 06/04/2017), Disp: 20 tablet, Rfl: 0  Review of Systems  Constitutional: Negative.   HENT: Negative.   Eyes: Negative.     Respiratory: Negative.   Cardiovascular: Positive for leg swelling.  Gastrointestinal: Negative.   Endocrine: Negative.   Genitourinary: Negative.   Musculoskeletal: Negative.   Skin: Negative.   Allergic/Immunologic: Negative.   Neurological: Negative.   Hematological: Negative.   Psychiatric/Behavioral: Negative.     Social History   Tobacco Use  . Smoking status: Former Smoker    Packs/day: 2.00    Years: 31.00    Pack years: 62.00    Types: Cigarettes    Last attempt to quit: 12/24/1980    Years since quitting: 36.4  . Smokeless tobacco: Never Used  . Tobacco comment: quit in 1982  Substance Use Topics  . Alcohol use: No    Alcohol/week: 0.0 oz   Objective:   BP 116/60 (BP Location: Left Arm, Patient Position: Sitting, Cuff Size: Large)   Pulse 80   Temp (!) 97.4 F (36.3 C) (Oral)   Resp 16   Wt 286 lb (129.7 kg)   BMI 36.72 kg/m  Vitals:   06/04/17 1530  BP: 116/60  Pulse: 80  Resp: 16  Temp: (!) 97.4 F (36.3 C)  TempSrc: Oral  Weight: 286 lb (129.7 kg)     Physical Exam  Constitutional: He is oriented to person, place, and time. He appears well-developed and well-nourished.  Eyes: Conjunctivae and EOM are normal. Pupils  are equal, round, and reactive to light.  Neck: Normal range of motion. Neck supple.  Cardiovascular: Normal rate, regular rhythm, normal heart sounds and intact distal pulses.  Pulmonary/Chest: Effort normal and breath sounds normal.  Musculoskeletal: He exhibits edema. Tenderness: 1+ edema.  Neurological: He is alert and oriented to person, place, and time. He has normal reflexes.  Psychiatric: He has a normal mood and affect. His behavior is normal. Judgment and thought content normal.        Assessment & Plan:     1. Cellulitis of lower extremity, unspecified laterality Improved. Continue lasix at current dosing, refilled Augmentin incase he needs it. Elevated feet wear support hose and walking will help with the swelling.   - amoxicillin-clavulanate (AUGMENTIN) 875-125 MG tablet; Take 1 tablet by mouth 2 (two) times daily.  Dispense: 20 tablet; Refill: 0 2.Venous Stasis 3.Obesity     HPI, Exam, and A&P Transcribed under the direction and in the presence of Elyssa Pendelton L. Cranford Mon, MD  Electronically Signed: Katina Dung, CMA  I have done the exam and reviewed the above chart and it is accurate to the best of my knowledge. Development worker, community has been used in this note in any air is in the dictation or transcription are unintentional.  Wilhemena Durie, MD  [facility]

## 2017-06-04 NOTE — Patient Instructions (Signed)
Elevated feet wear support hose and walking will help with the swelling.

## 2017-06-25 ENCOUNTER — Ambulatory Visit (INDEPENDENT_AMBULATORY_CARE_PROVIDER_SITE_OTHER): Payer: PPO | Admitting: Family Medicine

## 2017-06-25 VITALS — BP 106/62 | HR 60 | Temp 98.3°F | Resp 16 | Wt 285.0 lb

## 2017-06-25 DIAGNOSIS — L03119 Cellulitis of unspecified part of limb: Secondary | ICD-10-CM | POA: Diagnosis not present

## 2017-06-25 DIAGNOSIS — Z6836 Body mass index (BMI) 36.0-36.9, adult: Secondary | ICD-10-CM | POA: Diagnosis not present

## 2017-06-25 DIAGNOSIS — R600 Localized edema: Secondary | ICD-10-CM

## 2017-06-25 DIAGNOSIS — Z23 Encounter for immunization: Secondary | ICD-10-CM

## 2017-06-25 NOTE — Progress Notes (Signed)
HUXLEY SHURLEY  MRN: [id number] DOB: May 11, 1933  Subjective:  HPI   Patient is here for 3 week follow up. Last office visit was on 06/04/2017. Cellulitis: patient is doing better with swelling. He is taking lasix 40 mg 2 tablets daily still, wearing compression stockings and trying to elevate his legs as he can. He did not take another round of Augmentin and wants to see if he should do that-he filled it. Wt Readings from Last 3 Encounters:  06/25/17 285 lb (129.3 kg)  06/04/17 286 lb (129.7 kg)  05/21/17 286 lb (129.7 kg)   Also patient wants to get Shingrix #2. His first one was on 02/26/2017  Patient Active Problem List   Diagnosis Date Noted  . Chronic fatigue 11/29/2015  . BPH (benign prostatic hypertrophy) 02/22/2015  . Gastrointestinal ulcer   . History of peptic ulcer   . Gastritis   . Hematochezia   . Acute gastric ulcer   . Melena 11/12/2014  . Malignant neoplasm of skin 09/21/2014  . Cataract 09/21/2014  . Colon polyp 09/21/2014  . CAFL (chronic airflow limitation) ([place]) 09/21/2014  . Diabetes ([place]) 09/21/2014  . DD (diverticular disease) 09/21/2014  . Benign essential tremor 09/21/2014  . Personal history of tobacco use, presenting hazards to health 09/21/2014  . Acid reflux 09/21/2014  . Hemorrhoids 09/21/2014  . 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 HLD (hyperlipidemia) 09/21/2014  . BP (high blood pressure) 09/21/2014  . Adult hypothyroidism 09/21/2014  . Lymphoma ([place]) 09/21/2014  . Malaise and fatigue 09/21/2014  . Mononeuritis 09/21/2014  . Muscle ache 09/21/2014  . Neuropathy 09/21/2014  . Numbness and tingling 09/21/2014  . Arthritis, degenerative 09/21/2014  . Adiposity 09/21/2014  . Awareness of heartbeats 09/21/2014  . Borderline diabetes 09/21/2014  . Acne erythematosa 09/21/2014  . Rotator cuff syndrome 09/21/2014  . Athlete's foot 09/21/2014  . Has a tremor 09/21/2014  . Stasis, venous 09/21/2014  . Avitaminosis D 09/21/2014  . Arthritis  of knee, degenerative 11/16/2013  . Bradycardia, sinus 12/31/2011  . Left bundle branch block 12/31/2011  . First degree AV block 12/31/2011  . Edema of both legs 12/31/2011  . COPD UNSPECIFIED 11/09/2009  . HYPERTENSION, BENIGN 10/03/2009  . SLEEP APNEA 10/03/2009  . DYSPNEA 10/03/2009  . CAD, NATIVE VESSEL 05/04/2009  . Carotid disease, bilateral ([place]) 05/04/2009    Past Medical History:  Diagnosis Date  . Anginal pain ([place])   . Chronic airway obstruction, not elsewhere classified   . Colon polyps   . Coronary atherosclerosis of native coronary artery    nonobstructive  . Essential hypertension, benign   . Glucose intolerance (impaired glucose tolerance)   . Hypothyroidism   . Iron deficiency anemia   . Knee pain, right   . Occlusion and stenosis of carotid artery without mention of cerebral infarction    wears compression stockings  . Other dyspnea and respiratory abnormality    w/ pseuodowheeze resolves with purse lip manuever  . Precordial pain   . Shoulder pain, right   . Tremor   . Unspecified sleep apnea   . Venous insufficiency   . Wears dentures    full upper    Social History   Socioeconomic History  . Marital status: Married    Spouse name: Not on file  . Number of children: 4  . Years of education: Not on file  . Highest education level: Not on file  Social Needs  . Financial resource strain: Not hard at all  .  Food insecurity - worry: Never true  . Food insecurity - inability: Never true  . Transportation needs - medical: No  . Transportation needs - non-medical: No  Occupational History  . Occupation: retired    Comment: [employer]  . Smoking status: Former Smoker    Packs/day: 2.00    Years: 31.00    Pack years: 62.00    Types: Cigarettes    Last attempt to quit: 12/24/1980    Years since quitting: 36.5  . Smokeless tobacco: Never Used  . Tobacco comment: quit in 1982  Substance and Sexual Activity    . Alcohol use: No    Alcohol/week: 0.0 oz  . Drug use: No  . Sexual activity: Not on file  Other Topics Concern  . Not on file  Social History Narrative   Lives with wife, retired [employer], 2 children, 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    Outpatient Encounter Medications as of 06/25/2017  Medication Sig  . aspirin 81 MG tablet Take 81 mg by mouth daily.  . diphenhydramine-acetaminophen (TYLENOL PM) 25-500 MG TABS Take 2 tablets by mouth at bedtime as needed.  . doxazosin (CARDURA) 8 MG tablet Take 1 tablet by mouth once daily  . furosemide (LASIX) 40 MG tablet Take 1 tablet (40 mg total) by mouth daily.  [name] levothyroxine (SYNTHROID, LEVOTHROID) 75 MCG tablet Take 1 tablet by mouth daily in the morning  . pantoprazole (PROTONIX) 40 MG tablet TAKE 1 TABLET BY MOUTH ONCE DAILY BEFORE BREAKFAST *SCHEDULE  FOLLOW  UP  APPOINTMENT*  . quinapril (ACCUPRIL) 20 MG tablet Take 1 tablet (20 mg total) by mouth daily.  . rosuvastatin (CRESTOR) 20 MG tablet Take 1 tablet by mouth every day  . topiramate (TOPAMAX) 50 MG tablet TAKE 1 TABLET BY MOUTH TWICE DAILY  . amoxicillin-clavulanate (AUGMENTIN) 875-125 MG tablet Take 1 tablet by mouth 2 (two) times daily. (Patient not taking: Reported on 06/25/2017)   No facility-administered encounter medications on file as of 06/25/2017.     Allergies  Allergen Reactions  . Dexilant [Dexlansoprazole] Rash  . Primidone Itching and Rash    rash    Review of Systems  Constitutional: Positive for malaise/fatigue.  HENT: Negative.   Eyes: Negative.   Respiratory: Negative.   Cardiovascular: Positive for leg swelling. Negative for palpitations.  Gastrointestinal: Negative.   Musculoskeletal: Positive for joint pain.  Skin: Negative.   Neurological: Negative.  Negative for weakness.  Endo/Heme/Allergies: Negative.   Psychiatric/Behavioral: Negative.     Objective:  BP 106/62   Pulse 60   Temp 98.3 F (36.8 C)   Resp 16   Wt 285 lb (129.3 kg)   BMI 36.59  kg/m   Physical Exam  Constitutional: He is oriented to person, place, and time and well-developed, well-nourished, and in no distress.  HENT:  Head: Normocephalic and atraumatic.  Eyes: Conjunctivae are normal. No scleral icterus.  Neck: No thyromegaly present.  Cardiovascular: Normal rate, regular rhythm and normal heart sounds.  Pulmonary/Chest: Effort normal and breath sounds normal.  Abdominal: Soft.  Neurological: He is alert and oriented to person, place, and time. Gait normal. GCS score is 15.  Skin: Skin is warm and dry.  Psychiatric: Mood, memory, affect and judgment normal.    Assessment and Plan :  1. Cellulitis of lower extremity, unspecified laterality Resolved.  2. Need for shingles vaccine  - Varicella-zoster vaccine IM (Shingrix) 3.Chronic Venous Insufficiency 4.Obesity  I have done the exam and reviewed the chart and  it is accurate to the best of my knowledge. Development worker, community has been used and  any errors in dictation or transcription are unintentional. [name] M.D. [facility]

## 2017-06-30 DIAGNOSIS — D485 Neoplasm of uncertain behavior of skin: Secondary | ICD-10-CM | POA: Diagnosis not present

## 2017-06-30 DIAGNOSIS — L578 Other skin changes due to chronic exposure to nonionizing radiation: Secondary | ICD-10-CM | POA: Diagnosis not present

## 2017-06-30 DIAGNOSIS — L812 Freckles: Secondary | ICD-10-CM | POA: Diagnosis not present

## 2017-06-30 DIAGNOSIS — Z1283 Encounter for screening for malignant neoplasm of skin: Secondary | ICD-10-CM | POA: Diagnosis not present

## 2017-06-30 DIAGNOSIS — L821 Other seborrheic keratosis: Secondary | ICD-10-CM | POA: Diagnosis not present

## 2017-06-30 DIAGNOSIS — D225 Melanocytic nevi of trunk: Secondary | ICD-10-CM | POA: Diagnosis not present

## 2017-06-30 DIAGNOSIS — D1800 Hemangioma unspecified site: Secondary | ICD-10-CM | POA: Diagnosis not present

## 2017-06-30 DIAGNOSIS — Z85828 Personal history of other malignant neoplasm of skin: Secondary | ICD-10-CM | POA: Diagnosis not present

## 2017-06-30 DIAGNOSIS — D692 Other nonthrombocytopenic purpura: Secondary | ICD-10-CM | POA: Diagnosis not present

## 2017-06-30 DIAGNOSIS — L820 Inflamed seborrheic keratosis: Secondary | ICD-10-CM | POA: Diagnosis not present

## 2017-06-30 DIAGNOSIS — L570 Actinic keratosis: Secondary | ICD-10-CM | POA: Diagnosis not present

## 2017-07-05 ENCOUNTER — Other Ambulatory Visit: Payer: Self-pay | Admitting: Gastroenterology

## 2017-07-05 DIAGNOSIS — K253 Acute gastric ulcer without hemorrhage or perforation: Principal | ICD-10-CM

## 2017-07-05 DIAGNOSIS — B9681 Helicobacter pylori [H. pylori] as the cause of diseases classified elsewhere: Secondary | ICD-10-CM

## 2017-08-13 DIAGNOSIS — L304 Erythema intertrigo: Secondary | ICD-10-CM | POA: Diagnosis not present

## 2017-08-13 DIAGNOSIS — B3789 Other sites of candidiasis: Secondary | ICD-10-CM | POA: Diagnosis not present

## 2017-08-31 ENCOUNTER — Other Ambulatory Visit: Payer: Self-pay | Admitting: Family Medicine

## 2017-09-08 DIAGNOSIS — B351 Tinea unguium: Secondary | ICD-10-CM | POA: Diagnosis not present

## 2017-09-08 DIAGNOSIS — M2041 Other hammer toe(s) (acquired), right foot: Secondary | ICD-10-CM | POA: Diagnosis not present

## 2017-09-08 DIAGNOSIS — M79674 Pain in right toe(s): Secondary | ICD-10-CM | POA: Diagnosis not present

## 2017-09-08 DIAGNOSIS — M79675 Pain in left toe(s): Secondary | ICD-10-CM | POA: Diagnosis not present

## 2017-09-23 ENCOUNTER — Other Ambulatory Visit: Payer: Self-pay | Admitting: Family Medicine

## 2017-09-23 ENCOUNTER — Encounter: Payer: Self-pay | Admitting: Family Medicine

## 2017-09-23 ENCOUNTER — Ambulatory Visit (INDEPENDENT_AMBULATORY_CARE_PROVIDER_SITE_OTHER): Payer: PPO | Admitting: Family Medicine

## 2017-09-23 VITALS — BP 124/76 | HR 86 | Temp 99.2°F | Resp 18 | Wt 279.0 lb

## 2017-09-23 DIAGNOSIS — R251 Tremor, unspecified: Secondary | ICD-10-CM | POA: Diagnosis not present

## 2017-09-23 DIAGNOSIS — J069 Acute upper respiratory infection, unspecified: Secondary | ICD-10-CM

## 2017-09-23 LAB — POCT URINALYSIS DIPSTICK
BILIRUBIN UA: NEGATIVE
Glucose, UA: NEGATIVE
KETONES UA: NEGATIVE
Leukocytes, UA: NEGATIVE
NITRITE UA: NEGATIVE
ODOR: NORMAL
PH UA: 6 (ref 5.0–8.0)
PROTEIN UA: NEGATIVE
RBC UA: NEGATIVE
Spec Grav, UA: 1.015 (ref 1.010–1.025)
UROBILINOGEN UA: 0.2 U/dL

## 2017-09-23 MED ORDER — DOXYCYCLINE HYCLATE 100 MG PO TABS: 100.0000 mg | ORAL_TABLET | Freq: Two times a day (BID) | ORAL | 0 refills | Status: DC

## 2017-09-23 MED ORDER — PREDNISONE 20 MG PO TABS: ORAL_TABLET | ORAL | 0 refills | Status: DC

## 2017-09-23 NOTE — Progress Notes (Signed)
  Subjective:     Patient ID: Jared Rice, male   DOB: 1932/08/28, 84 y.o.   MRN: [id number] Chief Complaint  Patient presents with  . URI    onset for 4 days, symptoms include: sore throat, cough, chest congestion, and wheezing  . Urinary Tract Infection    pt states when he usually has UTI he gets the shakes?   HPI Describes cold sx without shortness of breath or fever. Has had mild shakes (? Chills) which he associates with prior UTI's. Not on respiratory medication.  Review of Systems     Objective:   Physical Exam  Constitutional: He appears well-developed and well-nourished. No distress.  Ears: T.M's intact without inflammation Throat: no tonsillar enlargement or exudate Neck: no cervical adenopathy Lungs: posterior coarse breath sounds with expiratory wheezing.     Assessment:    1. Shaking - POCT urinalysis dipstick  2. Upper respiratory tract infection, unspecified type:  - doxycycline (VIBRA-TABS) 100 MG tablet; Take 1 tablet (100 mg total) by mouth 2 (two) times daily.  Dispense: 14 tablet; Refill: 0 - predniSONE (DELTASONE) 20 MG tablet; One pill twice daily for 5 days  Dispense: 10 tablet; Refill: 0    Plan:    Discussed natural hx of URI and treatment with saline nasal spray and Robitussin DM. He is to start abx and prednisone for increased shortness of breath or increased thick sputum.

## 2017-09-23 NOTE — Patient Instructions (Signed)
Continue Robitussin DM and salt water nasal spray. If shortness of breath or increased thick sputum production start the antibiotic and prednisone. It will generally take a week for your cold symptoms to improve.

## 2017-10-02 ENCOUNTER — Encounter: Payer: Self-pay | Admitting: Family Medicine

## 2017-10-02 ENCOUNTER — Ambulatory Visit (INDEPENDENT_AMBULATORY_CARE_PROVIDER_SITE_OTHER): Payer: PPO | Admitting: Family Medicine

## 2017-10-02 ENCOUNTER — Other Ambulatory Visit: Payer: Self-pay

## 2017-10-02 VITALS — BP 120/66 | HR 70 | Temp 98.0°F | Resp 16 | Wt 265.6 lb

## 2017-10-02 DIAGNOSIS — J40 Bronchitis, not specified as acute or chronic: Secondary | ICD-10-CM | POA: Diagnosis not present

## 2017-10-02 MED ORDER — QUINAPRIL HCL 20 MG PO TABS: 20.0000 mg | ORAL_TABLET | Freq: Every day | ORAL | 3 refills | Status: DC

## 2017-10-02 MED ORDER — DOXYCYCLINE HYCLATE 100 MG PO TABS
100.0000 mg | ORAL_TABLET | Freq: Two times a day (BID) | ORAL | 0 refills | Status: DC
Start: 2017-10-02 — End: 2017-11-21

## 2017-10-02 NOTE — Telephone Encounter (Signed)
Patient was seen in office and requested refill on Quinapril 20mg  sent to his mail order pharmacy. Patient states that he has 40mg  tablets at home and has been splitting medication with Dr. Marlan Palau approval. Patient states that he has a few days left but would like 20mg  sent to pharmacy. KW

## 2017-10-02 NOTE — Patient Instructions (Addendum)
Continue Robitussin DM. Let me know how you are doing early next week.

## 2017-10-02 NOTE — Progress Notes (Signed)
  Subjective:     Patient ID: Jared Rice, male   DOB: 29-Mar-1933, 84 y.o.   MRN: [id number] Chief Complaint  Patient presents with  . URI    Patient returns to office today to follow up for URI after being seen in office with complaints of cough on 09/23/17. Paitent states that he finished doxycycline and Prednisone but has had no improvement with cough. Patient states that he is fatigue and chest has been feeling raw.    HPI States he felt like he was getting better on the abx. Reports clear to white sinus drainage and cough occasionally productive of light green phlegm.  Review of Systems     Objective:   Physical Exam  Constitutional: He appears well-developed and well-nourished. No distress.  Pulmonary/Chest: Effort normal.  Inspiratory coarse crackles; no expiratory wheezes.       Assessment:    1. Bronchitis: extend course of tx - doxycycline (VIBRA-TABS) 100 MG tablet; Take 1 tablet (100 mg total) by mouth 2 (two) times daily.  Dispense: 6 tablet; Refill: 0    Plan:    Continue Robitussin DM with phone f/u next week.

## 2017-10-02 NOTE — Telephone Encounter (Signed)
Please send into the pharmacy. Thanks!  °

## 2017-10-04 ENCOUNTER — Encounter: Payer: Self-pay | Admitting: Family Medicine

## 2017-10-04 ENCOUNTER — Ambulatory Visit (INDEPENDENT_AMBULATORY_CARE_PROVIDER_SITE_OTHER): Payer: PPO | Admitting: Family Medicine

## 2017-10-04 VITALS — BP 128/66 | HR 94 | Temp 100.3°F | Resp 16 | Wt 264.0 lb

## 2017-10-04 DIAGNOSIS — Z87438 Personal history of other diseases of male genital organs: Secondary | ICD-10-CM | POA: Diagnosis not present

## 2017-10-04 DIAGNOSIS — N3090 Cystitis, unspecified without hematuria: Secondary | ICD-10-CM

## 2017-10-04 LAB — POCT URINALYSIS DIPSTICK
BILIRUBIN UA: NEGATIVE
GLUCOSE UA: NEGATIVE
Nitrite, UA: POSITIVE
Spec Grav, UA: <=1.005 — AB (ref 1.010–1.025)
Urobilinogen, UA: 1 E.U./dL
pH, UA: 7.5 (ref 5.0–8.0)

## 2017-10-04 NOTE — Progress Notes (Signed)
Patient: Jared Rice Male    DOB: November 17, 1932   84 y.o.   MRN: [id number] Visit Date: 10/04/2017  Today's Provider: Vernie Murders, PA   Chief Complaint  Patient presents with  . Dysuria   Subjective:    Dysuria   This is a new problem. The current episode started today. The problem occurs every urination. The problem has been unchanged. The patient is experiencing no pain. The maximum temperature recorded prior to his arrival was 100 - 100.9 F. Associated symptoms include chills, frequency, nausea and urgency. Pertinent negatives include no discharge, flank pain, hematuria, hesitancy, possible pregnancy, sweats or vomiting. His past medical history is significant for recurrent UTIs.       Past Medical History:  Diagnosis Date  . Anginal pain ([place])   . Chronic airway obstruction, not elsewhere classified   . Colon polyps   . Coronary atherosclerosis of native coronary artery    nonobstructive  . Essential hypertension, benign   . Glucose intolerance (impaired glucose tolerance)   . Hypothyroidism   . Iron deficiency anemia   . Knee pain, right   . Occlusion and stenosis of carotid artery without mention of cerebral infarction    wears compression stockings  . Other dyspnea and respiratory abnormality    w/ pseuodowheeze resolves with purse lip manuever  . Precordial pain   . Shoulder pain, right   . Tremor   . Unspecified sleep apnea   . Venous insufficiency   . Wears dentures    full upper   Past Surgical History:  Procedure Laterality Date  . CARDIAC CATHETERIZATION    . COLONOSCOPY  03/17/12   Dr Byrnett-diverticulosis  . ESOPHAGOGASTRODUODENOSCOPY (EGD) WITH PROPOFOL N/A 11/25/2014   Procedure: ESOPHAGOGASTRODUODENOSCOPY (EGD) WITH PROPOFOL;  Surgeon: Lucilla Lame, MD;  Location: [facility];  Service: Endoscopy;  Laterality: N/A;  with biopsy  . ESOPHAGOGASTRODUODENOSCOPY (EGD) WITH PROPOFOL N/A 01/05/2015   Procedure: ESOPHAGOGASTRODUODENOSCOPY  (EGD) WITH PROPOFOL;  Surgeon: Lucilla Lame, MD;  Location: [facility];  Service: Endoscopy;  Laterality: N/A;  . EYE SURGERY Bilateral    cataract   . TOTAL KNEE ARTHROPLASTY Left    Family History  Problem Relation Age of Onset  . Heart failure Mother 10       congestive  . Heart attack Father 51  . Alcohol abuse Father   . Alzheimer's disease Brother 76  . Alzheimer's disease Sister 37  . Colon cancer Neg Hx   . Liver disease Neg Hx    Allergies  Allergen Reactions  . Dexilant [Dexlansoprazole] Rash  . Primidone Itching and Rash    rash    Current Outpatient Medications:  .  aspirin 81 MG tablet, Take 81 mg by mouth daily., Disp: , Rfl:  .  diphenhydramine-acetaminophen (TYLENOL PM) 25-500 MG TABS, Take 2 tablets by mouth at bedtime as needed., Disp: , Rfl:  .  doxazosin (CARDURA) 8 MG tablet, Take 1 tablet by mouth once daily, Disp: 90 tablet, Rfl: 1 .  doxycycline (VIBRA-TABS) 100 MG tablet, Take 1 tablet (100 mg total) by mouth 2 (two) times daily., Disp: 6 tablet, Rfl: 0 .  furosemide (LASIX) 40 MG tablet, Take 1 tablet (40 mg total) by mouth daily., Disp: , Rfl:  .  Iodoquinol-HC-Aloe Polysacch (ALCORTIN A) 1-2-1 % GEL, APPLY A THIN LAYER TO AFFECTED AREA(S)  AT BEDTIME, Disp: , Rfl: 3 .  levothyroxine (SYNTHROID, LEVOTHROID) 75 MCG tablet, Take 1 tablet by mouth daily  in the morning, Disp: 90 tablet, Rfl: 3 .  pantoprazole (PROTONIX) 40 MG tablet, TAKE 1 TABLET BY MOUTH ONCE DAILY BEFORE BREAKFAST* SCHEDULE FOLLOW UP APPOINTMENT*, Disp: 90 tablet, Rfl: 0 .  quinapril (ACCUPRIL) 20 MG tablet, Take 1 tablet (20 mg total) by mouth daily., Disp: 90 tablet, Rfl: 3 .  rosuvastatin (CRESTOR) 20 MG tablet, Take 1 tablet by mouth every day (Patient taking differently: Take 1  tablet by mouth every day), Disp: 90 tablet, Rfl: 3 .  topiramate (TOPAMAX) 50 MG tablet, TAKE 1 TABLET BY MOUTH TWICE DAILY, Disp: 60 tablet, Rfl: 5  Review of Systems  Constitutional: Positive for  chills.  Gastrointestinal: Positive for nausea. Negative for vomiting.  Genitourinary: Positive for dysuria, frequency and urgency. Negative for flank pain, hematuria and hesitancy.   Social History   Tobacco Use  . Smoking status: Former Smoker    Packs/day: 2.00    Years: 31.00    Pack years: 62.00    Types: Cigarettes    Last attempt to quit: 12/24/1980    Years since quitting: 36.8  . Smokeless tobacco: Never Used  . Tobacco comment: quit in 1982  Substance Use Topics  . Alcohol use: No    Alcohol/week: 0.0 oz   Objective:   BP 128/66   Pulse 94   Temp 100.3 F (37.9 C) (Oral)   Resp 16   Wt 264 lb (119.7 kg)   SpO2 94%   BMI 33.90 kg/m  Vitals:   10/04/17 1056  BP: 128/66  Pulse: 94  Resp: 16  Temp: 100.3 F (37.9 C)  TempSrc: Oral  SpO2: 94%  Weight: 264 lb (119.7 kg)   Urinalysis    Component Value Date/Time   BILIRUBINUR negative 10/04/2017 1059   PROTEINUR trace 10/04/2017 1059   UROBILINOGEN 1.0 10/04/2017 1059   NITRITE positive 10/04/2017 1059   LEUKOCYTESUR Large (3+) (A) 10/04/2017 1059     Physical Exam  Constitutional: He is oriented to person, place, and time. He appears well-developed and well-nourished. No distress.  HENT:  Head: Normocephalic and atraumatic.  Right Ear: Hearing normal.  Left Ear: Hearing normal.  Nose: Nose normal.  Eyes: Conjunctivae and lids are normal. Right eye exhibits no discharge. Left eye exhibits no discharge. No scleral icterus.  Cardiovascular: Normal rate.  Pulmonary/Chest: Effort normal. No respiratory distress.  No wheezing or rales today.  Abdominal: Soft. Bowel sounds are normal.  No CVA tenderness to percussion posteriorly. No suprapubic pain or mass.  Musculoskeletal: Normal range of motion.  Neurological: He is alert and oriented to person, place, and time.  Skin: Skin is intact. No lesion and no rash noted.  Psychiatric: He has a normal mood and affect. His speech is normal and behavior is  normal. Thought content normal.      Assessment & Plan:     1. Cystitis Symptoms of urinary frequency, urgency and some chill sensation that started yesterday. Temperature up to 100.3 now. No hematuria. Urinalysis positive for leukocytes and nitrites - microscopic exam showed 20+ WBC's and 3+ bacterial rods. Will get C&S, encouraged to drink extra fluids and may use Tylenol prn fever. May start his Cipro BID (keeps a reserve at home). Must be aware of possible urosepsis developing. Call report of progress Monday and should go to the ER if fever gets above 102.  - POCT urinalysis dipstick - Urine Culture  2. History of chronic prostatitis Has had significant infections in the past with [employer] in 2005 (  per Dr. Guinevere Ferrari urology notes). Keeps Cipro reserve at home to start if this type situation starts on a weekend before he can be evaluated.       Vernie Murders, PA  [facility]

## 2017-10-06 ENCOUNTER — Other Ambulatory Visit: Payer: Self-pay

## 2017-10-06 ENCOUNTER — Telehealth: Payer: Self-pay

## 2017-10-06 NOTE — Telephone Encounter (Signed)
FYI  Patient states that he only had 11 of the Cipro, with about 6 left.  He did not know how many he is supposed to take.  I told him to keep taking them until we call him about the sensitivity in the event we need to change it and if you want him to be on it longer we can send in more at that time.

## 2017-10-06 NOTE — Telephone Encounter (Signed)
Correct.  Thanks. 

## 2017-10-07 ENCOUNTER — Telehealth: Payer: Self-pay

## 2017-10-07 ENCOUNTER — Other Ambulatory Visit: Payer: Self-pay

## 2017-10-07 LAB — URINE CULTURE

## 2017-10-07 MED ORDER — CIPROFLOXACIN HCL 500 MG PO TABS: 500.0000 mg | ORAL_TABLET | Freq: Two times a day (BID) | ORAL | 0 refills | Status: DC

## 2017-10-07 NOTE — Progress Notes (Unsigned)
o

## 2017-10-07 NOTE — Telephone Encounter (Signed)
-----   Message from Margo Common, [place] sent at 10/06/2017  7:54 AM EDT ----- Culture confirms infection. Awaiting final identification of bacteria and sensitivity report. Continue present Cipro dose and increased water intake.

## 2017-10-07 NOTE — Telephone Encounter (Signed)
Patient's wife [name] (on [place]) advised.

## 2017-10-07 NOTE — Progress Notes (Signed)
Patient advised and med sent to pharmacy

## 2017-10-17 ENCOUNTER — Other Ambulatory Visit: Payer: Self-pay | Admitting: Family Medicine

## 2017-10-17 ENCOUNTER — Other Ambulatory Visit: Payer: Self-pay | Admitting: Gastroenterology

## 2017-10-17 DIAGNOSIS — K253 Acute gastric ulcer without hemorrhage or perforation: Principal | ICD-10-CM

## 2017-10-17 DIAGNOSIS — B9681 Helicobacter pylori [H. pylori] as the cause of diseases classified elsewhere: Secondary | ICD-10-CM

## 2017-10-29 ENCOUNTER — Ambulatory Visit (INDEPENDENT_AMBULATORY_CARE_PROVIDER_SITE_OTHER): Payer: PPO | Admitting: Physician Assistant

## 2017-10-29 ENCOUNTER — Encounter: Payer: Self-pay | Admitting: Physician Assistant

## 2017-10-29 ENCOUNTER — Ambulatory Visit
Admission: RE | Admit: 2017-10-29 | Discharge: 2017-10-29 | Disposition: A | Discharge status: Home / Self Care | Payer: PPO | Source: Ambulatory Visit | Attending: Physician Assistant | Admitting: Physician Assistant

## 2017-10-29 VITALS — BP 126/68 | HR 70 | Temp 98.2°F | Wt 266.4 lb

## 2017-10-29 DIAGNOSIS — S0990XA Unspecified injury of head, initial encounter: Secondary | ICD-10-CM | POA: Diagnosis not present

## 2017-10-29 DIAGNOSIS — W19XXXA Unspecified fall, initial encounter: Secondary | ICD-10-CM | POA: Diagnosis not present

## 2017-10-29 DIAGNOSIS — M25532 Pain in left wrist: Secondary | ICD-10-CM

## 2017-10-29 DIAGNOSIS — M79642 Pain in left hand: Secondary | ICD-10-CM | POA: Diagnosis not present

## 2017-10-29 DIAGNOSIS — M7989 Other specified soft tissue disorders: Secondary | ICD-10-CM | POA: Diagnosis not present

## 2017-10-29 NOTE — Patient Instructions (Addendum)
Head Injury, Adult  There are many types of head injuries. They can be as minor as a bump. Some head injuries can be worse. Worse injuries include:  · A strong hit to the head that hurts the brain (concussion).  · A bruise of the brain (contusion). This means there is bleeding in the brain that can cause swelling.  · A cracked skull (skull fracture).  · Bleeding in the brain that gathers, gets thick (makes a clot), and forms a bump (hematoma).    Most problems from a head injury come in the first 24 hours. However, you may still have side effects up to 7-10 days after your injury. It is important to watch your condition for any changes.  Follow these instructions at home:  Activity  · Rest as much as possible.  · Avoid activities that are hard or tiring.  · Make sure you get enough sleep.  · Limit activities that need a lot of thought or attention, such as:  ? Watching TV.  ? Playing memory games and puzzles.  ? Job-related work or homework.  ? Working on the computer, social media, and texting.  · Avoid activities that could cause another head injury until your doctor says it is okay. This includes playing sports.  · Ask your doctor when it is safe for you to go back to your normal activities, such as work or school. Ask your doctor for a step-by-step plan for slowly going back to your normal activities.  · Ask your doctor when you can drive, ride a bicycle, or use heavy machinery. Never do these activities if you are dizzy.  Lifestyle  · Do not drink alcohol until your doctor says it is okay.  · Avoid drug use.  · If it is harder than usual to remember things, write them down.  · If you are easily distracted, try to do one thing at a time.  · Talk with family members or close friends when making important decisions.  · Tell your friends, family, a trusted coworker, and work manager about your injury, symptoms, and limits (restrictions). Have them watch for any problems that are new or getting worse.  General  instructions  · Take over-the-counter and prescription medicines only as told by your doctor.  · Have someone stay with you for 24 hours after your head injury. This person should watch you for any changes in your symptoms and be ready to get help.  · Keep all follow-up visits as told by your doctor. This is important.  How is this prevented?  · Work on your balance and strength. This can help you avoid falls.  · Wear a seatbelt when you are in a moving vehicle.  · Wear a helmet when:  ? Riding a bicycle.  ? Skiing.  ? Doing any other sport or activity that has a risk of injury.  · Drink alcohol only in moderation.  · Make your home safer by:  ? Getting rid of clutter from the floors and stairs, like things that can make you trip.  ? Using grab bars in bathrooms and handrails by stairs.  ? Placing non-slip mats on floors and in bathtubs.  ? Putting more light in dim areas.  Get help right away if:  · You have:  ? A very bad (severe) headache that is not helped by medicine.  ? Trouble walking or weakness in your arms and legs.  ? Clear or bloody fluid coming from your nose   or ears.  ? Changes in your seeing (vision).  ? Jerky movements that you cannot control (seizure).  · You throw up (vomit).  · Your symptoms get worse.  · You lose balance.  · Your speech is slurred.  · You pass out.  · You are sleepier and have trouble staying awake.  · The black centers of your eyes (pupils) change in size.  These symptoms may be an emergency. Do not wait to see if the symptoms will go away. Get medical help right away. Call your local emergency services (911 in the [place] Do not drive yourself to the hospital.  This information is not intended to replace advice given to you by your health care provider. Make sure you discuss any questions you have with your health care provider.  Document Released: 04/25/2008 Document Revised: 09/06/2016 Document Reviewed: 11/21/2015  Elsevier Interactive Patient Education © 2018 Elsevier  Inc.

## 2017-10-29 NOTE — Progress Notes (Signed)
Patient: Jared Rice Male    DOB: [birth year]   84 y.o.   MRN: [id number] Visit Date: 10/29/2017  Today's Provider: Trinna Post, PA-C   Chief Complaint  Patient presents with  . Wrist Pain   Subjective:    Jared Rice is an 84 y/o man with history of CAD and ASA 81 mg who presents today two hours after a fall. He reports he was in his garage and tripped over a wire and fell face first onto the garage floor. He reports he hit his head. He denies losing consciousness. He denies vision change, headache, confusion, vomiting. He reports a little bit of nausea right now.   He reports left wrist pain and swelling. There is some bruising. He had to be helped off the floor because he couldn't get up.  Fall  The accident occurred 1 to 3 hours ago. The fall occurred while standing (tripped over a wire in the garage). He landed on concrete. The point of impact was the head (landed face first). Pain location: left wrist pain. Associated symptoms include nausea. Associated symptoms comments: Swelling . He has tried nothing for the symptoms.    Allergies  Allergen Reactions  . Dexilant [Dexlansoprazole] Rash  . Primidone Itching and Rash    rash     Current Outpatient Medications:  .  aspirin 81 MG tablet, Take 81 mg by mouth daily., Disp: , Rfl:  .  ciprofloxacin (CIPRO) 500 MG tablet, Take 1 tablet (500 mg total) by mouth 2 (two) times daily., Disp: 10 tablet, Rfl: 0 .  diphenhydramine-acetaminophen (TYLENOL PM) 25-500 MG TABS, Take 2 tablets by mouth at bedtime as needed., Disp: , Rfl:  .  doxazosin (CARDURA) 8 MG tablet, Take 1 tablet by mouth once daily, Disp: 90 tablet, Rfl: 1 .  doxycycline (VIBRA-TABS) 100 MG tablet, Take 1 tablet (100 mg total) by mouth 2 (two) times daily., Disp: 6 tablet, Rfl: 0 .  furosemide (LASIX) 40 MG tablet, Take 1 tablet (40 mg total) by mouth daily., Disp: , Rfl:  .  Iodoquinol-HC-Aloe Polysacch (ALCORTIN A) 1-2-1 % GEL, APPLY A THIN LAYER TO  AFFECTED AREA(S)  AT BEDTIME, Disp: , Rfl: 3 .  levothyroxine (SYNTHROID, LEVOTHROID) 75 MCG tablet, Take 1 tablet by mouth daily in the morning, Disp: 90 tablet, Rfl: 3 .  pantoprazole (PROTONIX) 40 MG tablet, TAKE 1 TABLET BY MOUTH ONCE DAILY BEFORE BREAKFAST* SCHEDULE FOLLOW UP APPOINTMENT*, Disp: 90 tablet, Rfl: 0 .  quinapril (ACCUPRIL) 20 MG tablet, Take 1 tablet (20 mg total) by mouth daily., Disp: 90 tablet, Rfl: 3 .  rosuvastatin (CRESTOR) 20 MG tablet, Take 1 tablet by mouth every day (Patient taking differently: Take 1  tablet by mouth every day), Disp: 90 tablet, Rfl: 3 .  topiramate (TOPAMAX) 50 MG tablet, TAKE 1 TABLET BY MOUTH TWICE DAILY, Disp: 60 tablet, Rfl: 5  Review of Systems  Constitutional: Negative.   Respiratory: Negative.   Cardiovascular: Negative.   Gastrointestinal: Positive for nausea.  Musculoskeletal:       Left wrist pain after a fall     Social History   Tobacco Use  . Smoking status: Former Smoker    Packs/day: 2.00    Years: 31.00    Pack years: 62.00    Types: Cigarettes    Last attempt to quit: 12/24/1980    Years since quitting: 36.8  . Smokeless tobacco: Never Used  . Tobacco comment: quit in 1982  Substance Use  Topics  . Alcohol use: No    Alcohol/week: 0.0 oz   Objective:   BP 126/68 (BP Location: Right Arm, Patient Position: Sitting, Cuff Size: Large)   Pulse 70   Temp 98.2 F (36.8 C) (Oral)   Wt 266 lb 6.4 oz (120.8 kg)   SpO2 97%   BMI 34.20 kg/m  Vitals:   10/29/17 1600  BP: 126/68  Pulse: 70  Temp: 98.2 F (36.8 C)  TempSrc: Oral  SpO2: 97%  Weight: 266 lb 6.4 oz (120.8 kg)     Physical Exam  Constitutional: He is oriented to person, place, and time. He appears well-developed and well-nourished.  HENT:  Head:    Eyes: Pupils are equal, round, and reactive to light.  Cardiovascular: Normal rate and regular rhythm.  Pulses:      Radial pulses are 2+ on the right side, and 2+ on the left side.  Pulmonary/Chest:  Effort normal and breath sounds normal.  Musculoskeletal:       Left wrist: He exhibits decreased range of motion, tenderness, bony tenderness and swelling. He exhibits no effusion, no crepitus, no deformity and no laceration.       Left hand: He exhibits swelling. He exhibits normal capillary refill. Normal sensation noted.  Tenderness to palpation on dorsomedial aspect of left hand. Ecchymosis and swelling present.   Neurological: He is alert and oriented to person, place, and time. No cranial nerve deficit or sensory deficit. Coordination normal.  Skin: Skin is warm and dry.  Psychiatric: He has a normal mood and affect. His behavior is normal.        Assessment & Plan:     1. Left wrist pain  Suspect fracture. Will get XRAY and direct appropriately.   - DG Wrist Complete Left; Future - DG Hand Complete Left; Future  2. Fall, initial encounter  - DG Wrist Complete Left; Future - DG Hand Complete Left; Future  3. Injury of head, initial encounter  Hematoma present but patient has no neuro deficits. Will not scan today. Counseled on return signs and precautions  Return if symptoms worsen or fail to improve.  The entirety of the information documented in the History of Present Illness, Review of Systems and Physical Exam were personally obtained by me. Portions of this information were initially documented by Ashley Royalty, CMA and reviewed by me for thoroughness and accuracy.          Trinna Post, PA-C  [facility]

## 2017-10-30 ENCOUNTER — Telehealth: Payer: Self-pay

## 2017-10-30 NOTE — Telephone Encounter (Signed)
-----   Message from Trinna Post, [place] sent at 10/30/2017  9:18 AM EDT ----- No evidence of fracture or dislocation. If patient has worsening pain, loss of motion etc would recommend being seen by an orthopedist.

## 2017-10-30 NOTE — Telephone Encounter (Signed)
LMTCB 10/30/2017  Thanks,   -Mickel Baas

## 2017-11-07 ENCOUNTER — Encounter: Payer: Self-pay | Admitting: Family Medicine

## 2017-11-07 ENCOUNTER — Ambulatory Visit (INDEPENDENT_AMBULATORY_CARE_PROVIDER_SITE_OTHER): Payer: PPO | Admitting: Family Medicine

## 2017-11-07 VITALS — BP 130/70 | HR 72 | Temp 97.6°F | Resp 16 | Wt 271.0 lb

## 2017-11-07 DIAGNOSIS — I872 Venous insufficiency (chronic) (peripheral): Secondary | ICD-10-CM

## 2017-11-07 DIAGNOSIS — M7989 Other specified soft tissue disorders: Secondary | ICD-10-CM | POA: Diagnosis not present

## 2017-11-07 NOTE — Telephone Encounter (Signed)
Advised  ED 

## 2017-11-07 NOTE — Patient Instructions (Addendum)
Resume furosemide. Let us know if swelling not improving early next week.

## 2017-11-07 NOTE — Progress Notes (Signed)
  Subjective:     Patient ID: Jared Rice, male   DOB: [birth year], 84 y.o.   MRN: [id number] Chief Complaint  Patient presents with  . Leg Swelling    pt reports that he noticed his right leg swelling the last 3-4 days. she noticed it when he was putting on his compression hose because it was more diffficul to get them up on the right leg. He reports his wife noticed his right leg is warmer to the touch. His leg is tender in this area as well.    HPI He has a hx of venous insufficiency and states he has been wearing his compression stockings. However, he has not been taking furosemide for the last several days. No hx of DVT with normal venous doppler in December of last year.  Review of Systems     Objective:   Physical Exam  Constitutional: He appears well-developed and well-nourished. No distress.  Cardiovascular: Normal rate and regular rhythm.  Pulmonary/Chest: Breath sounds normal.  Musculoskeletal: He exhibits edema (bilateral pitting edema R > L).  Skin:  Stasis changes c/w hx of venous insufficiency       Assessment:    1. Leg swelling: resume furosemide/continue compression stockings  2. Venous insufficiency    Plan:    Call early next week if leg swelling not improving.

## 2017-11-12 ENCOUNTER — Other Ambulatory Visit: Payer: Self-pay

## 2017-11-21 ENCOUNTER — Ambulatory Visit (INDEPENDENT_AMBULATORY_CARE_PROVIDER_SITE_OTHER): Payer: PPO | Admitting: Family Medicine

## 2017-11-21 ENCOUNTER — Encounter: Payer: Self-pay | Admitting: Family Medicine

## 2017-11-21 VITALS — BP 102/70 | HR 84 | Temp 97.5°F | Resp 16 | Wt 264.0 lb

## 2017-11-21 DIAGNOSIS — K529 Noninfective gastroenteritis and colitis, unspecified: Secondary | ICD-10-CM | POA: Diagnosis not present

## 2017-11-21 DIAGNOSIS — R1110 Vomiting, unspecified: Secondary | ICD-10-CM

## 2017-11-21 LAB — POCT URINALYSIS DIPSTICK
BILIRUBIN UA: NEGATIVE
GLUCOSE UA: NEGATIVE
KETONES UA: NEGATIVE
Leukocytes, UA: NEGATIVE
Nitrite, UA: NEGATIVE
Protein, UA: NEGATIVE
Spec Grav, UA: 1.01 (ref 1.010–1.025)
UROBILINOGEN UA: 0.2 U/dL
pH, UA: 7 (ref 5.0–8.0)

## 2017-11-21 NOTE — Progress Notes (Signed)
  Subjective:     Patient ID: Jared Rice, male   DOB: 12/29/1932, 84 y.o.   MRN: [id number] Chief Complaint  Patient presents with  . Tremors    Patient comes in office today with concerns of a urinary infection. Patient states this morning he woke up with nausea, shaking, loose stool and emesis.    HPI States he "ate too much" at [employer] last night. He has had similar shaking with the onset of a UTI so wished his urine checked. Has had two episodes of loose stools and one vomiting episode. Accompanied by his wife today.  Review of Systems     Objective:   Physical Exam  Constitutional: He appears well-developed and well-nourished. No distress.  Had shaking during oximetry so question accuracy  Abdominal: Soft. There is no tenderness. There is no guarding.       Assessment:    1. Gastroenteritis  2. Non-intractable vomiting, presence of nausea not specified, unspecified vomiting type - POCT urinalysis dipstick    Plan:    Discussed holding furosemide x 1-2 days and increase fluid intake with Gatorade and water. He is aware of Saturday clinic.

## 2017-11-21 NOTE — Patient Instructions (Signed)
Increase fluids with Gatorade and water. Eat as tolerated. Let us know if further symptoms. Hold your fluid pill for a day or two.

## 2017-11-25 ENCOUNTER — Ambulatory Visit: Payer: PPO | Admitting: Gastroenterology

## 2017-11-25 ENCOUNTER — Other Ambulatory Visit: Payer: Self-pay | Admitting: Family Medicine

## 2017-11-25 ENCOUNTER — Encounter: Payer: Self-pay | Admitting: Gastroenterology

## 2017-11-25 VITALS — BP 89/55 | HR 84 | Ht 74.0 in | Wt 260.8 lb

## 2017-11-25 DIAGNOSIS — B9681 Helicobacter pylori [H. pylori] as the cause of diseases classified elsewhere: Secondary | ICD-10-CM

## 2017-11-25 DIAGNOSIS — K253 Acute gastric ulcer without hemorrhage or perforation: Secondary | ICD-10-CM

## 2017-11-25 DIAGNOSIS — K219 Gastro-esophageal reflux disease without esophagitis: Secondary | ICD-10-CM

## 2017-11-25 DIAGNOSIS — R600 Localized edema: Secondary | ICD-10-CM

## 2017-11-25 MED ORDER — PANTOPRAZOLE SODIUM 40 MG PO TBEC: 40.0000 mg | DELAYED_RELEASE_TABLET | Freq: Every day | ORAL | 3 refills | Status: DC

## 2017-11-25 NOTE — Progress Notes (Signed)
Primary Care Physician: Jerrol Banana., MD  Primary Gastroenterologist:  Dr. Lucilla Lame  Chief Complaint  Patient presents with  . Medication Refill    HPI: Jared Rice is a 84 y.o. male here for follow-up with his heartburn.  The patient states that he ran out of his Protonix approximately 1 month ago.  The patient reports that his heartburn has not been excessively bed lately.  He also states that he has had diarrhea for the last couple days.  Has been getting better today.  There is no report of any unexplained weight loss dysphagia black stools or bloody stools.  He does comes today with a report of needing to have his prescription refilled.  Current Outpatient Medications  Medication Sig Dispense Refill  . aspirin 81 MG tablet Take 81 mg by mouth daily.    . diphenhydramine-acetaminophen (TYLENOL PM) 25-500 MG TABS Take 2 tablets by mouth at bedtime as needed.    . doxazosin (CARDURA) 8 MG tablet Take 1 tablet by mouth once daily 90 tablet 1  . furosemide (LASIX) 40 MG tablet Take 1 tablet (40 mg total) by mouth daily.    Jerrye Beavers Polysacch (ALCORTIN A) 1-2-1 % GEL APPLY A THIN LAYER TO AFFECTED AREA(S)  AT BEDTIME  3  . levothyroxine (SYNTHROID, LEVOTHROID) 75 MCG tablet Take 1 tablet by mouth daily in the morning 90 tablet 3  . pantoprazole (PROTONIX) 40 MG tablet Take 1 tablet (40 mg total) by mouth daily. 90 tablet 3  . quinapril (ACCUPRIL) 20 MG tablet Take 1 tablet (20 mg total) by mouth daily. 90 tablet 3  . rosuvastatin (CRESTOR) 20 MG tablet Take 1 tablet by mouth every day (Patient taking differently: Take 1  tablet by mouth every day) 90 tablet 3  . topiramate (TOPAMAX) 50 MG tablet TAKE 1 TABLET BY MOUTH TWICE DAILY 60 tablet 5  . amoxicillin (AMOXIL) 500 MG capsule TAKE FOUR CAPSULES BY MOUTH AS A SINGLE DOSE ONE HOUR BEFORE DENTAL APPOINTMENT  2  . furosemide (LASIX) 20 MG tablet Take 2 tablets by mouth daily 180 tablet 2   No current  facility-administered medications for this visit.     Allergies as of 11/25/2017 - Review Complete 11/25/2017  Allergen Reaction Noted  . Dexilant [dexlansoprazole] Rash 11/22/2014  . Primidone Itching and Rash 09/21/2014    ROS:  General: Negative for anorexia, weight loss, fever, chills, fatigue, weakness. ENT: Negative for hoarseness, difficulty swallowing , nasal congestion. CV: Negative for chest pain, angina, palpitations, dyspnea on exertion, peripheral edema.  Respiratory: Negative for dyspnea at rest, dyspnea on exertion, cough, sputum, wheezing.  GI: See history of present illness. GU:  Negative for dysuria, hematuria, urinary incontinence, urinary frequency, nocturnal urination.  Endo: Negative for unusual weight change.    Physical Examination:   BP (!) 89/55   Pulse 84   Ht 6\' 2"  (1.88 m)   Wt 260 lb 12.8 oz (118.3 kg)   BMI 33.48 kg/m   General: Well-nourished, well-developed in no acute distress.  Eyes: No icterus. Conjunctivae pink. Mouth: Oropharyngeal mucosa moist and pink , no lesions erythema or exudate. Lungs: Clear to auscultation bilaterally. Non-labored. Heart: Regular rate and rhythm, no murmurs rubs or gallops.  Abdomen: Bowel sounds are normal, nontender, nondistended, no hepatosplenomegaly or masses, no abdominal bruits or hernia , no rebound or guarding.   Extremities: No lower extremity edema. No clubbing or deformities. Neuro: Alert and oriented x 3.  Grossly intact. Skin:  Warm and dry, no jaundice.   Psych: Alert and cooperative, normal mood and affect.  Labs:    Imaging Studies: Dg Wrist Complete Left  Result Date: 10/30/2017 CLINICAL DATA:  Left wrist pain and swelling after fall. EXAM: LEFT WRIST - COMPLETE 3+ VIEW COMPARISON:  None. FINDINGS: There is no evidence of fracture or dislocation. There is no evidence of arthropathy or other focal bone abnormality. Soft tissues are unremarkable. IMPRESSION: No significant abnormality seen in the  left wrist. Electronically Signed   By: Marijo Conception, M.D.   On: 10/30/2017 09:00   Dg Hand Complete Left  Result Date: 10/30/2017 CLINICAL DATA:  Left hand pain and swelling after fall. EXAM: LEFT HAND - COMPLETE 3+ VIEW COMPARISON:  None. FINDINGS: There is no evidence of fracture or dislocation. There is no evidence of arthropathy or other focal bone abnormality. Soft tissues are unremarkable. IMPRESSION: No significant abnormality seen in the left hand. Electronically Signed   By: Marijo Conception, M.D.   On: 10/30/2017 09:02    Assessment and Plan:   Jared Rice is a 84 y.o. y/o male who has a history of heartburn and ulcers.  The patient has been on protonix and doing well.  The patient now needs a refill of his Protonix.  The patient will have this medication refilled.  The patient has denied any worrisome symptoms such as dysphagia rectal bleeding or anemia.  The patient also denies any abdominal pain. The patient has been explained the plan and agrees with it.    Lucilla Lame, MD. Marval Regal   Note: This dictation was prepared with Dragon dictation along with smaller phrase technology. Any transcriptional errors that result from this process are unintentional.

## 2017-11-26 DIAGNOSIS — M2041 Other hammer toe(s) (acquired), right foot: Secondary | ICD-10-CM | POA: Diagnosis not present

## 2017-11-26 DIAGNOSIS — B351 Tinea unguium: Secondary | ICD-10-CM | POA: Diagnosis not present

## 2017-11-26 DIAGNOSIS — M79674 Pain in right toe(s): Secondary | ICD-10-CM | POA: Diagnosis not present

## 2017-11-26 DIAGNOSIS — M79675 Pain in left toe(s): Secondary | ICD-10-CM | POA: Diagnosis not present

## 2017-12-17 ENCOUNTER — Telehealth: Payer: Self-pay

## 2017-12-17 NOTE — Telephone Encounter (Signed)
Please review

## 2017-12-17 NOTE — Telephone Encounter (Signed)
Patient called office stating that [facility] automated system had called him to let him know that he is due for a diabetic eye exam, patient states that he was never told he was diabetic but wanted to let Dr. Rosanna Randy to know that he has set up a eye exam at [facility] that is scheduled for September. Patient request that this be updated in his chart so he would not receive any further calls about it. KW

## 2017-12-29 DIAGNOSIS — D692 Other nonthrombocytopenic purpura: Secondary | ICD-10-CM | POA: Diagnosis not present

## 2017-12-29 DIAGNOSIS — L578 Other skin changes due to chronic exposure to nonionizing radiation: Secondary | ICD-10-CM | POA: Diagnosis not present

## 2017-12-29 DIAGNOSIS — L720 Epidermal cyst: Secondary | ICD-10-CM | POA: Diagnosis not present

## 2017-12-29 DIAGNOSIS — L570 Actinic keratosis: Secondary | ICD-10-CM | POA: Diagnosis not present

## 2017-12-29 DIAGNOSIS — L304 Erythema intertrigo: Secondary | ICD-10-CM | POA: Diagnosis not present

## 2018-02-18 DIAGNOSIS — E119 Type 2 diabetes mellitus without complications: Secondary | ICD-10-CM | POA: Diagnosis not present

## 2018-02-18 LAB — HM DIABETES EYE EXAM

## 2018-02-24 ENCOUNTER — Other Ambulatory Visit: Payer: Self-pay | Admitting: Family Medicine

## 2018-03-20 ENCOUNTER — Encounter: Payer: Self-pay | Admitting: Family Medicine

## 2018-03-20 ENCOUNTER — Ambulatory Visit (INDEPENDENT_AMBULATORY_CARE_PROVIDER_SITE_OTHER): Payer: PPO | Admitting: Family Medicine

## 2018-03-20 VITALS — BP 130/60 | HR 71 | Temp 98.4°F | Resp 16 | Wt 276.6 lb

## 2018-03-20 DIAGNOSIS — J069 Acute upper respiratory infection, unspecified: Secondary | ICD-10-CM | POA: Diagnosis not present

## 2018-03-20 NOTE — Progress Notes (Signed)
  Subjective:     Patient ID: Jared Rice, male   DOB: 19-Jun-1932, 84 y.o.   MRN: [id number] Chief Complaint  Patient presents with  . Cough    Patient comes in office today with concerns of cough that is productive of dark green sputum. Patient reports that cold like symptoms began a week ago initially as a sore throat, today patient reports runny nose, chest congestion and wheezing. Patient denies taking any medication for relief.    HPI States sinus drainage is mild and clear. No fever or chills. States he felt transiently short of breath two nights ago but this has resolved.  Review of Systems     Objective:   Physical Exam  Constitutional: He appears well-developed and well-nourished. No distress.  Ears: T.M's intact without inflammation Throat: no tonsillar enlargement or exudate Neck: no cervical adenopathy Lungs: clear     Assessment:    1. URI, acute    Plan:    Samples of Mucinex DM x 6 days. Call if cough/sputum not improving next week or increased shortness of breath.

## 2018-03-20 NOTE — Patient Instructions (Signed)
Discussed use of Mucinex DM. Call me next week if cough and sputum not improving or you notice increased shortness of breath.

## 2018-03-21 ENCOUNTER — Ambulatory Visit: Payer: PPO

## 2018-03-26 ENCOUNTER — Telehealth: Payer: Self-pay | Admitting: Family Medicine

## 2018-03-26 ENCOUNTER — Other Ambulatory Visit: Payer: Self-pay | Admitting: Family Medicine

## 2018-03-26 ENCOUNTER — Ambulatory Visit: Payer: PPO | Admitting: Family Medicine

## 2018-03-26 DIAGNOSIS — J40 Bronchitis, not specified as acute or chronic: Secondary | ICD-10-CM

## 2018-03-26 MED ORDER — DOXYCYCLINE HYCLATE 100 MG PO TABS: 100.0000 mg | ORAL_TABLET | Freq: Two times a day (BID) | ORAL | 0 refills | Status: DC

## 2018-03-26 NOTE — Telephone Encounter (Signed)
See phone contact message.

## 2018-04-04 ENCOUNTER — Ambulatory Visit (INDEPENDENT_AMBULATORY_CARE_PROVIDER_SITE_OTHER): Payer: PPO | Admitting: Family Medicine

## 2018-04-04 ENCOUNTER — Encounter: Payer: Self-pay | Admitting: Family Medicine

## 2018-04-04 VITALS — BP 136/70 | HR 74 | Temp 97.9°F | Resp 18 | Wt 267.0 lb

## 2018-04-04 DIAGNOSIS — R05 Cough: Secondary | ICD-10-CM | POA: Diagnosis not present

## 2018-04-04 DIAGNOSIS — R059 Cough, unspecified: Secondary | ICD-10-CM

## 2018-04-04 DIAGNOSIS — J410 Simple chronic bronchitis: Secondary | ICD-10-CM

## 2018-04-04 MED ORDER — AZITHROMYCIN 250 MG PO TABS: ORAL_TABLET | ORAL | 0 refills | Status: DC

## 2018-04-04 NOTE — Progress Notes (Signed)
Patient: Jared Rice Male    DOB: December 23, 1932   84 y.o.   MRN: [id number] Visit Date: 04/04/2018  Today's Provider: Wilhemena Durie, MD   Chief Complaint  Patient presents with  . Cough    x several weeks   Subjective:    Cough  This is a recurrent problem. The problem has been waxing and waning. The cough is productive of sputum (clear colored sputum). Pertinent negatives include no chest pain, chills, fever, shortness of breath or wheezing. Treatments tried: oral antibiotics. The treatment provided mild relief.  Clear /yellow sputum,no fevers or SOB.     Allergies  Allergen Reactions  . Dexilant [Dexlansoprazole] Rash  . Primidone Itching and Rash    rash     Current Outpatient Medications:  .  amoxicillin (AMOXIL) 500 MG capsule, TAKE FOUR CAPSULES BY MOUTH AS A SINGLE DOSE ONE HOUR BEFORE DENTAL APPOINTMENT, Disp: , Rfl: 2 .  aspirin 81 MG tablet, Take 81 mg by mouth daily., Disp: , Rfl:  .  diphenhydramine-acetaminophen (TYLENOL PM) 25-500 MG TABS, Take 2 tablets by mouth at bedtime as needed., Disp: , Rfl:  .  doxazosin (CARDURA) 8 MG tablet, Take 1 tablet by mouth once daily, Disp: 90 tablet, Rfl: 3 .  furosemide (LASIX) 20 MG tablet, Take 2 tablets by mouth daily, Disp: 180 tablet, Rfl: 2 .  furosemide (LASIX) 40 MG tablet, Take 1 tablet (40 mg total) by mouth daily., Disp: , Rfl:  .  Iodoquinol-HC-Aloe Polysacch (ALCORTIN A) 1-2-1 % GEL, APPLY A THIN LAYER TO AFFECTED AREA(S)  AT BEDTIME, Disp: , Rfl: 3 .  levothyroxine (SYNTHROID, LEVOTHROID) 75 MCG tablet, Take 1 tablet by mouth daily in the morning, Disp: 90 tablet, Rfl: 3 .  pantoprazole (PROTONIX) 40 MG tablet, Take 1 tablet (40 mg total) by mouth daily., Disp: 90 tablet, Rfl: 3 .  quinapril (ACCUPRIL) 20 MG tablet, Take 1 tablet (20 mg total) by mouth daily., Disp: 90 tablet, Rfl: 3 .  rosuvastatin (CRESTOR) 20 MG tablet, Take 1 tablet by mouth every day (Patient taking differently: Take 1  tablet by  mouth every day), Disp: 90 tablet, Rfl: 3 .  topiramate (TOPAMAX) 50 MG tablet, TAKE 1 TABLET BY MOUTH TWICE DAILY, Disp: 60 tablet, Rfl: 5 .  doxycycline (VIBRA-TABS) 100 MG tablet, Take 1 tablet (100 mg total) by mouth 2 (two) times daily. (Patient not taking: Reported on 04/04/2018), Disp: 14 tablet, Rfl: 0  Review of Systems  Constitutional: Negative for appetite change, chills and fever.  HENT: Negative.   Eyes: Negative.   Respiratory: Positive for cough. Negative for chest tightness, shortness of breath and wheezing.   Cardiovascular: Negative for chest pain and palpitations.  Gastrointestinal: Negative for abdominal pain, nausea and vomiting.  Endocrine: Negative.   Allergic/Immunologic: Negative.   Hematological: Negative.   Psychiatric/Behavioral: Negative.     Social History   Tobacco Use  . Smoking status: Former Smoker    Packs/day: 2.00    Years: 31.00    Pack years: 62.00    Types: Cigarettes    Last attempt to quit: 12/24/1980    Years since quitting: 37.3  . Smokeless tobacco: Never Used  . Tobacco comment: quit in 1982  Substance Use Topics  . Alcohol use: No    Alcohol/week: 0.0 standard drinks   Objective:   BP 136/70 (BP Location: Left Arm, Patient Position: Sitting, Cuff Size: Large)   Pulse 74   Temp 97.9 F (  36.6 C) (Oral)   Resp 18   Wt 267 lb (121.1 kg)   SpO2 95% Comment: room air  BMI 34.28 kg/m  Vitals:   04/04/18 1135  BP: 136/70  Pulse: 74  Resp: 18  Temp: 97.9 F (36.6 C)  TempSrc: Oral  SpO2: 95%  Weight: 267 lb (121.1 kg)     Physical Exam  Constitutional: He is oriented to person, place, and time. He appears well-developed and well-nourished.  HENT:  Head: Normocephalic and atraumatic.  Right Ear: External ear normal.  Left Ear: External ear normal.  Nose: Nose normal.  Eyes: Conjunctivae are normal. No scleral icterus.  Cardiovascular: Normal rate, regular rhythm and normal heart sounds.  Pulmonary/Chest: Effort  normal.  Neurological: He is alert and oriented to person, place, and time.  Skin: Skin is warm and dry.  Psychiatric: He has a normal mood and affect. His behavior is normal. Judgment and thought content normal.        Assessment & Plan:     1. Simple chronic bronchitis ([place]) ZPak,no CXR needed.  2. Cough OTC Robitussin.        I have done the exam and reviewed the above chart and it is accurate to the best of my knowledge. Development worker, community has been used in this note in any air is in the dictation or transcription are unintentional.  Wilhemena Durie, MD  [place]

## 2018-04-04 NOTE — Patient Instructions (Signed)
Take Robitussin for cough.

## 2018-04-22 ENCOUNTER — Ambulatory Visit (INDEPENDENT_AMBULATORY_CARE_PROVIDER_SITE_OTHER): Payer: PPO | Admitting: Family Medicine

## 2018-04-22 DIAGNOSIS — Z23 Encounter for immunization: Secondary | ICD-10-CM

## 2018-05-04 ENCOUNTER — Other Ambulatory Visit: Payer: Self-pay

## 2018-05-04 DIAGNOSIS — M79675 Pain in left toe(s): Secondary | ICD-10-CM | POA: Diagnosis not present

## 2018-05-04 DIAGNOSIS — M79674 Pain in right toe(s): Secondary | ICD-10-CM | POA: Diagnosis not present

## 2018-05-04 DIAGNOSIS — M2041 Other hammer toe(s) (acquired), right foot: Secondary | ICD-10-CM | POA: Diagnosis not present

## 2018-05-04 DIAGNOSIS — B351 Tinea unguium: Secondary | ICD-10-CM | POA: Diagnosis not present

## 2018-05-05 ENCOUNTER — Other Ambulatory Visit: Payer: Self-pay

## 2018-05-05 ENCOUNTER — Other Ambulatory Visit: Payer: Self-pay | Admitting: Family Medicine

## 2018-05-06 MED ORDER — TOPIRAMATE 50 MG PO TABS: 50.0000 mg | ORAL_TABLET | Freq: Two times a day (BID) | ORAL | 5 refills | Status: DC

## 2018-05-18 ENCOUNTER — Ambulatory Visit (INDEPENDENT_AMBULATORY_CARE_PROVIDER_SITE_OTHER): Payer: PPO

## 2018-05-18 VITALS — BP 118/54 | HR 90 | Temp 97.7°F | Ht 74.0 in | Wt 277.6 lb

## 2018-05-18 DIAGNOSIS — Z0000 Encounter for general adult medical examination without abnormal findings: Secondary | ICD-10-CM | POA: Diagnosis not present

## 2018-05-18 NOTE — Progress Notes (Signed)
Subjective:   Jared Rice is a 84 y.o. male who presents for Medicare Annual/Subsequent preventive examination.  Review of Systems:  N/A  Cardiac Risk Factors include: advanced age (>17men, >30 women);diabetes mellitus;dyslipidemia;hypertension;male gender;obesity (BMI >30kg/m2)     Objective:    Vitals: BP (!) 118/54 (BP Location: Right Arm)   Pulse 90   Temp 97.7 F (36.5 C) (Oral)   Ht 6\' 2"  (1.88 m)   Wt 277 lb 9.6 oz (125.9 kg)   BMI 35.64 kg/m   Body mass index is 35.64 kg/m.  Advanced Directives 05/18/2018 05/15/2017 07/16/2016 06/22/2015 02/22/2015 01/05/2015 10/27/2014  Does Patient Have a Medical Advance Directive? Yes Yes Yes Yes Yes No Yes  Type of Paramedic of [place];Living will [place];Living will Living will Living will;Healthcare Power of [place];Living will - [place];Living will  Does patient want to make changes to medical advance directive? - - No - Patient declined - - - -  Copy of [place] in Chart? Yes - validated most recent copy scanned in chart (See row information) No - copy requested - - No - copy requested - No - copy requested  Would patient like information on creating a medical advance directive? - - - - - Yes - Educational materials given -    Tobacco Social History   Tobacco Use  Smoking Status Former Smoker  . Packs/day: 2.00  . Years: 31.00  . Pack years: 62.00  . Types: Cigarettes  . Last attempt to quit: 12/24/1980  . Years since quitting: 37.4  Smokeless Tobacco Never Used  Tobacco Comment   quit in 1982     Counseling given: Not Answered Comment: quit in 1982   Clinical Intake:  Pre-visit preparation completed: Yes  Pain : No/denies pain Pain Score: 0-No pain    Diabetes:  Is the patient diabetic?  Yes  If diabetic, was a CBG obtained today?  No  Did the patient bring in their glucometer from home?  No   How often do you monitor your CBG's? Does not.   Financial Strains and Diabetes Management:  Are you having any financial strains with the device, your supplies or your medication? No .  Does the patient want to be seen by Chronic Care Management for management of their diabetes?  No  Would the patient like to be referred to a Nutritionist or for Diabetic Management?  No   Diabetic Exams:  Diabetic Eye Exam: Completed 02/18/18.   Diabetic Foot Exam: Completed 12/15/12. Pt has been advised about the importance in completing this exam. Note made to f/u on this at next OV.    Nutritional Status: BMI > 30  Obese Nutritional Risks: None  How often do you need to have someone help you when you read instructions, pamphlets, or other written materials from your doctor or pharmacy?: 1 - Never  Interpreter Needed?: No  Information entered by :: [facility], LPN  Past Medical History:  Diagnosis Date  . Anginal pain ([place])   . Chronic airway obstruction, not elsewhere classified   . Colon polyps   . Coronary atherosclerosis of native coronary artery    nonobstructive  . Diabetes mellitus without complication ([place])   . Essential hypertension, benign   . Glucose intolerance (impaired glucose tolerance)   . Hypothyroidism   . Iron deficiency anemia   . Knee pain, right   . Occlusion and stenosis of carotid artery without  mention of cerebral infarction    wears compression stockings  . Other dyspnea and respiratory abnormality    w/ pseuodowheeze resolves with purse lip manuever  . Precordial pain   . Shoulder pain, right   . Tremor   . Unspecified sleep apnea   . Venous insufficiency   . Wears dentures    full upper   Past Surgical History:  Procedure Laterality Date  . CARDIAC CATHETERIZATION    . COLONOSCOPY  03/17/12   Dr Byrnett-diverticulosis  . ESOPHAGOGASTRODUODENOSCOPY (EGD) WITH PROPOFOL N/A 11/25/2014   Procedure: ESOPHAGOGASTRODUODENOSCOPY (EGD) WITH PROPOFOL;   Surgeon: Lucilla Lame, MD;  Location: [facility];  Service: Endoscopy;  Laterality: N/A;  with biopsy  . ESOPHAGOGASTRODUODENOSCOPY (EGD) WITH PROPOFOL N/A 01/05/2015   Procedure: ESOPHAGOGASTRODUODENOSCOPY (EGD) WITH PROPOFOL;  Surgeon: Lucilla Lame, MD;  Location: [facility];  Service: Endoscopy;  Laterality: N/A;  . EYE SURGERY Bilateral    cataract   . TOTAL KNEE ARTHROPLASTY Left    Family History  Problem Relation Age of Onset  . Heart failure Mother 38       congestive  . Heart attack Father 85  . Alcohol abuse Father   . Alzheimer's disease Brother 4  . Alzheimer's disease Sister 47  . Colon cancer Neg Hx   . Liver disease Neg Hx    Social History   Socioeconomic History  . Marital status: Married    Spouse name: Not on file  . Number of children: 4  . Years of education: Not on file  . Highest education level: Bachelor's degree (e.g., BA, AB, BS)  Occupational History  . Occupation: retired    Comment: [employer]  Social Needs  . Financial resource strain: Not hard at all  . Food insecurity:    Worry: Never true    Inability: Never true  . Transportation needs:    Medical: No    Non-medical: No  Tobacco Use  . Smoking status: Former Smoker    Packs/day: 2.00    Years: 31.00    Pack years: 62.00    Types: Cigarettes    Last attempt to quit: 12/24/1980    Years since quitting: 37.4  . Smokeless tobacco: Never Used  . Tobacco comment: quit in 1982  Substance and Sexual Activity  . Alcohol use: No    Alcohol/week: 0.0 standard drinks  . Drug use: No  . Sexual activity: Not on file  Lifestyle  . Physical activity:    Days per week: 0 days    Minutes per session: 0 min  . Stress: Not at all  Relationships  . Social connections:    Talks on phone: Patient refused    Gets together: Patient refused    Attends religious service: Patient refused    Active member of club or organization: Patient refused    Attends  meetings of clubs or organizations: Patient refused    Relationship status: Patient refused  Other Topics Concern  . Not on file  Social History Narrative   Lives with wife, retired [employer], 2 children, 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    Outpatient Encounter Medications as of 05/18/2018  Medication Sig  . amoxicillin (AMOXIL) 500 MG capsule TAKE FOUR CAPSULES BY MOUTH AS A SINGLE DOSE ONE HOUR BEFORE DENTAL APPOINTMENT  . aspirin 81 MG tablet Take 81 mg by mouth daily.  . diphenhydramine-acetaminophen (TYLENOL PM) 25-500 MG TABS Take 2 tablets by mouth at bedtime as needed.  . doxazosin (CARDURA) 8 MG tablet  Take 1 tablet by mouth once daily  . furosemide (LASIX) 20 MG tablet Take 2 tablets by mouth daily  . Iodoquinol-HC-Aloe Polysacch (ALCORTIN A) 1-2-1 % GEL as needed.   [name] levothyroxine (SYNTHROID, LEVOTHROID) 75 MCG tablet Take 1 tablet by mouth daily in the morning  . pantoprazole (PROTONIX) 40 MG tablet Take 1 tablet (40 mg total) by mouth daily.  . quinapril (ACCUPRIL) 20 MG tablet Take 1 tablet (20 mg total) by mouth daily.  . rosuvastatin (CRESTOR) 20 MG tablet Take 1 tablet by mouth every day (Patient taking differently: Take 1  tablet by mouth every day)  . topiramate (TOPAMAX) 50 MG tablet Take 1 tablet (50 mg total) by mouth 2 (two) times daily.  [name] azithromycin (ZITHROMAX) 250 MG tablet Take as directed on package (Patient not taking: Reported on 05/18/2018)  . doxycycline (VIBRA-TABS) 100 MG tablet Take 1 tablet (100 mg total) by mouth 2 (two) times daily. (Patient not taking: Reported on 04/04/2018)  . furosemide (LASIX) 40 MG tablet Take 1 tablet (40 mg total) by mouth daily. (Patient not taking: Reported on 05/18/2018)   No facility-administered encounter medications on file as of 05/18/2018.     Activities of Daily Living In your present state of health, do you have any difficulty performing the following activities: 05/18/2018 09/23/2017  Hearing? N N  Vision? N N    Difficulty concentrating or making decisions? N N  Walking or climbing stairs? N N  Dressing or bathing? N N  Doing errands, shopping? N N  Preparing Food and eating ? N -  Using the Toilet? N -  In the past six months, have you accidently leaked urine? N -  Do you have problems with loss of bowel control? N -  Managing your Medications? N -  Managing your Finances? N -  Housekeeping or managing your Housekeeping? N -  Some recent data might be hidden    Patient Care Team: Jerrol Banana., MD as PCP - General (Unknown Physician Specialty) Dingeldein, Remo Lipps, MD as Consulting Physician (Ophthalmology) [place]ique, Peter M, MD as Consulting Physician (Cardiology) Elvina Mattes, Adele Schilder as Attending Physician (Podiatry) Ralene Bathe, MD (Dermatology)   Assessment:   This is a routine wellness examination for [place].  Exercise Activities and Dietary recommendations Current Exercise Habits: The patient does not participate in regular exercise at present, Exercise limited by: orthopedic condition(s)  Goals    . Exercise 3x per week (30 min per time)     Recommend to start exercising 3 times a week for 30 minutes at a time.        Fall Risk Fall Risk  05/18/2018 09/23/2017 05/15/2017 05/14/2016 05/10/2015  Falls in the past year? 0 No Yes No No  Number falls in past yr: 0 - 2 or more - -  Injury with Fall? 0 - No - -  Follow up - - Falls prevention discussed - -   FALL RISK PREVENTION PERTAINING TO THE HOME:  Any stairs in or around the home WITH handrails? Yes  Home free of loose throw rugs in walkways, pet beds, electrical cords, etc? Yes  Adequate lighting in your home to reduce risk of falls? Yes   ASSISTIVE DEVICES UTILIZED TO PREVENT FALLS:  Life alert? No  Use of a cane, walker or w/c? Yes, uses a can occasionally.  Grab bars in the bathroom? Yes  Shower chair or bench in shower? Yes  Elevated toilet seat or a handicapped toilet? Yes  TIMED UP AND  GO:  Was the test performed? No .    Depression Screen PHQ 2/9 Scores 05/18/2018 09/23/2017 05/15/2017 05/14/2016  PHQ - 2 Score 0 0 0 0    Cognitive Function     6CIT Screen 05/18/2018 05/15/2017  What Year? 0 points 0 points  What month? - 0 points  What time? 0 points 0 points  Count back from 20 0 points 0 points  Months in reverse 0 points 0 points  Repeat phrase 0 points 0 points  Total Score - 0    Immunization History  Administered Date(s) Administered  . Influenza Split 02/20/2011, 02/27/2012  . Influenza, High Dose Seasonal PF 02/22/2014, 02/18/2015, 02/29/2016, 02/26/2017, 04/22/2018  . Influenza,inj,Quad PF,6+ Mos 02/13/2013  . Influenza-Unspecified 02/24/2013  . Pneumococcal Conjugate-13 04/14/2014  . Pneumococcal Polysaccharide-23 03/25/1997, 04/05/2003  . Td 08/29/2003  . Tdap 02/20/2011  . Zoster 05/12/2007  . Zoster Recombinat (Shingrix) 02/26/2017, 06/25/2017    Qualifies for Shingles Vaccine? Completed Shingrix series.  Tdap: Up to date  Flu Vaccine: Up to date  Pneumococcal Vaccine: Up to date   Screening Tests Health Maintenance  Topic Date Due  . FOOT EXAM  11/06/1942  . HEMOGLOBIN A1C  02/21/2017  . OPHTHALMOLOGY EXAM  02/19/2019  . TETANUS/TDAP  02/19/2021  . INFLUENZA VACCINE  Completed  . PNA vac Low Risk Adult  Completed   Cancer Screenings:  Colorectal Screening: No longer required.   Lung Cancer Screening: (Low Dose CT Chest recommended if Age 98-80 years, 30 pack-year currently smoking OR have quit w/in 15years.) does not qualify.    Additional Screening:  Vision Screening: Recommended annual ophthalmology exams for early detection of glaucoma and other disorders of the eye.  Dental Screening: Recommended annual dental exams for proper oral hygiene  Community Resource Referral:  CRR required this visit?  No        Plan:  I have personally reviewed and addressed the Medicare Annual Wellness questionnaire and have  noted the following in the patient's chart:  A. Medical and social history B. Use of alcohol, tobacco or illicit drugs  C. Current medications and supplements D. Functional ability and status E.  Nutritional status F.  Physical activity G. Advance directives H. List of other physicians I.  Hospitalizations, surgeries, and ER visits in previous 12 months J.  [place] such as hearing and vision if needed, cognitive and depression L. Referrals and appointments - none  In addition, I have reviewed and discussed with patient certain preventive protocols, quality metrics, and best practice recommendations. A written personalized care plan for preventive services as well as general preventive health recommendations were provided to patient.  See attached scanned questionnaire for additional information.   Signed,  Fabio Neighbors, LPN Nurse Health Advisor   Nurse Recommendations: Pt needs a diabetic foot exam and Hgb A1c checked at next OV.

## 2018-05-18 NOTE — Patient Instructions (Signed)
Jared Rice , Thank you for taking time to come for your Medicare Wellness Visit. I appreciate your ongoing commitment to your health goals. Please review the following plan we discussed and let me know if I can assist you in the future.   Screening recommendations/referrals: Colonoscopy: No longer required.  Recommended yearly ophthalmology/optometry visit for glaucoma screening and checkup Recommended yearly dental visit for hygiene and checkup  Vaccinations: Influenza vaccine: Up to date Pneumococcal vaccine: Completed series Tdap vaccine: Up to date, due 01/2021 Shingles vaccine: Completed Shingrix series    Advanced directives: Currently on file.  Conditions/risks identified: Obesity- Continue trying to exercise at least 3 days a a week for 30 minutes or more at a time.   Next appointment: 06/02/18 @ 4:20 PM.  Preventive Care 65 Years and Older, Male Preventive care refers to lifestyle choices and visits with your health care provider that can promote health and wellness. What does preventive care include?  A yearly physical exam. This is also called an annual well check.  Dental exams once or twice a year.  Routine eye exams. Ask your health care provider how often you should have your eyes checked.  Personal lifestyle choices, including:  Daily care of your teeth and gums.  Regular physical activity.  Eating a healthy diet.  Avoiding tobacco and drug use.  Limiting alcohol use.  Practicing safe sex.  Taking low doses of aspirin every day.  Taking vitamin and mineral supplements as recommended by your health care provider. What happens during an annual well check? The services and screenings done by your health care provider during your annual well check will depend on your age, overall health, lifestyle risk factors, and family history of disease. Counseling  Your health care provider may ask you questions about your:  Alcohol use.  Tobacco use.  Drug  use.  Emotional well-being.  Home and relationship well-being.  Sexual activity.  Eating habits.  History of falls.  Memory and ability to understand (cognition).  Work and work Statistician. Screening  You may have the following tests or measurements:  Height, weight, and BMI.  Blood pressure.  Lipid and cholesterol levels. These may be checked every 5 years, or more frequently if you are over 25 years old.  Skin check.  Lung cancer screening. You may have this screening every year starting at age 33 if you have a 30-pack-year history of smoking and currently smoke or have quit within the past 15 years.  Fecal occult blood test (FOBT) of the stool. You may have this test every year starting at age 41.  Flexible sigmoidoscopy or colonoscopy. You may have a sigmoidoscopy every 5 years or a colonoscopy every 10 years starting at age 57.  Prostate cancer screening. Recommendations will vary depending on your family history and other risks.  Hepatitis C blood test.  Hepatitis B blood test.  Sexually transmitted disease (STD) testing.  Diabetes screening. This is done by checking your blood sugar (glucose) after you have not eaten for a while (fasting). You may have this done every 1-3 years.  Abdominal aortic aneurysm (AAA) screening. You may need this if you are a current or former smoker.  Osteoporosis. You may be screened starting at age 69 if you are at high risk. Talk with your health care provider about your test results, treatment options, and if necessary, the need for more tests. Vaccines  Your health care provider may recommend certain vaccines, such as:  Influenza vaccine. This is recommended every  year.  Tetanus, diphtheria, and acellular pertussis (Tdap, Td) vaccine. You may need a Td booster every 10 years.  Zoster vaccine. You may need this after age 61.  Pneumococcal 13-valent conjugate (PCV13) vaccine. One dose is recommended after age  11.  Pneumococcal polysaccharide (PPSV23) vaccine. One dose is recommended after age 51. Talk to your health care provider about which screenings and vaccines you need and how often you need them. This information is not intended to replace advice given to you by your health care provider. Make sure you discuss any questions you have with your health care provider. Document Released: 06/09/2015 Document Revised: 01/31/2016 Document Reviewed: 03/14/2015 Elsevier Interactive Patient Education  2017 [place] Prevention in the Home Falls can cause injuries. They can happen to people of all ages. There are many things you can do to make your home safe and to help prevent falls. What can I do on the outside of my home?  Regularly fix the edges of walkways and driveways and fix any cracks.  Remove anything that might make you trip as you walk through a door, such as a raised step or threshold.  Trim any bushes or trees on the path to your home.  Use bright outdoor lighting.  Clear any walking paths of anything that might make someone trip, such as rocks or tools.  Regularly check to see if handrails are loose or broken. Make sure that both sides of any steps have handrails.  Any raised decks and porches should have guardrails on the edges.  Have any leaves, snow, or ice cleared regularly.  Use sand or salt on walking paths during winter.  Clean up any spills in your garage right away. This includes oil or grease spills. What can I do in the bathroom?  Use night lights.  Install grab bars by the toilet and in the tub and shower. Do not use towel bars as grab bars.  Use non-skid mats or decals in the tub or shower.  If you need to sit down in the shower, use a plastic, non-slip stool.  Keep the floor dry. Clean up any water that spills on the floor as soon as it happens.  Remove soap buildup in the tub or shower regularly.  Attach bath mats securely with double-sided  non-slip rug tape.  Do not have throw rugs and other things on the floor that can make you trip. What can I do in the bedroom?  Use night lights.  Make sure that you have a light by your bed that is easy to reach.  Do not use any sheets or blankets that are too big for your bed. They should not hang down onto the floor.  Have a firm chair that has side arms. You can use this for support while you get dressed.  Do not have throw rugs and other things on the floor that can make you trip. What can I do in the kitchen?  Clean up any spills right away.  Avoid walking on wet floors.  Keep items that you use a lot in easy-to-reach places.  If you need to reach something above you, use a strong step stool that has a grab bar.  Keep electrical cords out of the way.  Do not use floor polish or wax that makes floors slippery. If you must use wax, use non-skid floor wax.  Do not have throw rugs and other things on the floor that can make you trip. What can  I do with my stairs?  Do not leave any items on the stairs.  Make sure that there are handrails on both sides of the stairs and use them. Fix handrails that are broken or loose. Make sure that handrails are as long as the stairways.  Check any carpeting to make sure that it is firmly attached to the stairs. Fix any carpet that is loose or worn.  Avoid having throw rugs at the top or bottom of the stairs. If you do have throw rugs, attach them to the floor with carpet tape.  Make sure that you have a light switch at the top of the stairs and the bottom of the stairs. If you do not have them, ask someone to add them for you. What else can I do to help prevent falls?  Wear shoes that:  Do not have high heels.  Have rubber bottoms.  Are comfortable and fit you well.  Are closed at the toe. Do not wear sandals.  If you use a stepladder:  Make sure that it is fully opened. Do not climb a closed stepladder.  Make sure that both  sides of the stepladder are locked into place.  Ask someone to hold it for you, if possible.  Clearly mark and make sure that you can see:  Any grab bars or handrails.  First and last steps.  Where the edge of each step is.  Use tools that help you move around (mobility aids) if they are needed. These include:  Canes.  Walkers.  Scooters.  Crutches.  Turn on the lights when you go into a dark area. Replace any light bulbs as soon as they burn out.  Set up your furniture so you have a clear path. Avoid moving your furniture around.  If any of your floors are uneven, fix them.  If there are any pets around you, be aware of where they are.  Review your medicines with your doctor. Some medicines can make you feel dizzy. This can increase your chance of falling. Ask your doctor what other things that you can do to help prevent falls. This information is not intended to replace advice given to you by your health care provider. Make sure you discuss any questions you have with your health care provider. Document Released: 03/09/2009 Document Revised: 10/19/2015 Document Reviewed: 06/17/2014 Elsevier Interactive Patient Education  2017 [employer].

## 2018-05-25 ENCOUNTER — Other Ambulatory Visit: Payer: Self-pay | Admitting: Family Medicine

## 2018-05-28 ENCOUNTER — Other Ambulatory Visit: Payer: Self-pay | Admitting: Family Medicine

## 2018-05-28 NOTE — Telephone Encounter (Signed)
Clabe Seal w/ [facility] - [phone] secure voicemail.  Please leave full first and last name.  Requesting 90 day refills on:  rosuvastatin (CRESTOR) 20 MG tablet  Please advise.  Thanks, [employer]

## 2018-05-29 NOTE — Telephone Encounter (Signed)
Prescription was filled yesterday, okay to change to 90day? KW

## 2018-06-01 MED ORDER — ROSUVASTATIN CALCIUM 20 MG PO TABS: 20.0000 mg | ORAL_TABLET | Freq: Every day | ORAL | 3 refills | Status: DC

## 2018-06-02 ENCOUNTER — Encounter: Payer: Self-pay | Admitting: Family Medicine

## 2018-06-02 ENCOUNTER — Ambulatory Visit (INDEPENDENT_AMBULATORY_CARE_PROVIDER_SITE_OTHER): Payer: PPO | Admitting: Family Medicine

## 2018-06-02 VITALS — BP 119/61 | HR 75 | Temp 97.6°F | Wt 270.0 lb

## 2018-06-02 DIAGNOSIS — D509 Iron deficiency anemia, unspecified: Secondary | ICD-10-CM

## 2018-06-02 DIAGNOSIS — E7800 Pure hypercholesterolemia, unspecified: Secondary | ICD-10-CM | POA: Diagnosis not present

## 2018-06-02 DIAGNOSIS — E039 Hypothyroidism, unspecified: Secondary | ICD-10-CM | POA: Diagnosis not present

## 2018-06-02 DIAGNOSIS — R600 Localized edema: Secondary | ICD-10-CM

## 2018-06-02 DIAGNOSIS — I10 Essential (primary) hypertension: Secondary | ICD-10-CM | POA: Diagnosis not present

## 2018-06-02 NOTE — Progress Notes (Signed)
Patient: Jared Rice Male    DOB: Feb 06, 1933   85 y.o.   MRN: [id number] Visit Date: 06/02/2018  Today's Provider: Wilhemena Durie, MD   Chief Complaint  Patient presents with  . Leg Swelling   Subjective:    HPI Leg Swelling Patient presents today for bilateral leg swelling. Patient states that the swelling last for a couple of week but has improved. Patient states he started taking his Lasix 20mg  BID. He actually feels very well with diuresis.  Allergies  Allergen Reactions  . Dexilant [Dexlansoprazole] Rash  . Primidone Itching and Rash    rash     Current Outpatient Medications:  .  aspirin 81 MG tablet, Take 81 mg by mouth daily., Disp: , Rfl:  .  diphenhydramine-acetaminophen (TYLENOL PM) 25-500 MG TABS, Take 2 tablets by mouth at bedtime as needed., Disp: , Rfl:  .  doxazosin (CARDURA) 8 MG tablet, Take 1 tablet by mouth once daily, Disp: 90 tablet, Rfl: 3 .  furosemide (LASIX) 20 MG tablet, Take 2 tablets by mouth daily, Disp: 180 tablet, Rfl: 2 .  Iodoquinol-HC-Aloe Polysacch (ALCORTIN A) 1-2-1 % GEL, as needed. , Disp: , Rfl: 3 .  levothyroxine (SYNTHROID, LEVOTHROID) 75 MCG tablet, Take 1 tablet by mouth daily in the morning, Disp: 90 tablet, Rfl: 2 .  pantoprazole (PROTONIX) 40 MG tablet, Take 1 tablet (40 mg total) by mouth daily., Disp: 90 tablet, Rfl: 3 .  quinapril (ACCUPRIL) 20 MG tablet, Take 1 tablet (20 mg total) by mouth daily., Disp: 90 tablet, Rfl: 3 .  rosuvastatin (CRESTOR) 20 MG tablet, Take 1 tablet (20 mg total) by mouth daily., Disp: 90 tablet, Rfl: 3 .  topiramate (TOPAMAX) 50 MG tablet, Take 1 tablet (50 mg total) by mouth 2 (two) times daily., Disp: 60 tablet, Rfl: 5 .  amoxicillin (AMOXIL) 500 MG capsule, TAKE FOUR CAPSULES BY MOUTH AS A SINGLE DOSE ONE HOUR BEFORE DENTAL APPOINTMENT, Disp: , Rfl: 2 .  azithromycin (ZITHROMAX) 250 MG tablet, Take as directed on package (Patient not taking: Reported on 05/18/2018), Disp: 6 each, Rfl:  0 .  doxycycline (VIBRA-TABS) 100 MG tablet, Take 1 tablet (100 mg total) by mouth 2 (two) times daily. (Patient not taking: Reported on 04/04/2018), Disp: 14 tablet, Rfl: 0 .  furosemide (LASIX) 40 MG tablet, Take 1 tablet (40 mg total) by mouth daily. (Patient not taking: Reported on 05/18/2018), Disp: , Rfl:   Review of Systems  Constitutional: Negative.   HENT: Negative.   Eyes: Negative.   Cardiovascular: Positive for leg swelling.  Endocrine: Negative.   Musculoskeletal: Negative.   Allergic/Immunologic: Negative.   Neurological: Negative.   Hematological: Negative.   Psychiatric/Behavioral: Negative.     Social History   Tobacco Use  . Smoking status: Former Smoker    Packs/day: 2.00    Years: 31.00    Pack years: 62.00    Types: Cigarettes    Last attempt to quit: 12/24/1980    Years since quitting: 37.4  . Smokeless tobacco: Never Used  . Tobacco comment: quit in 1982  Substance Use Topics  . Alcohol use: No    Alcohol/week: 0.0 standard drinks      Objective:   BP 119/61 (BP Location: Right Arm, Patient Position: Sitting, Cuff Size: Large)   Pulse 75   Temp 97.6 F (36.4 C) (Oral)   Wt 270 lb (122.5 kg)   SpO2 95%   BMI 34.67 kg/m  Vitals:  06/02/18 1622  BP: 119/61  Pulse: 75  Temp: 97.6 F (36.4 C)  TempSrc: Oral  SpO2: 95%  Weight: 270 lb (122.5 kg)     Physical Exam Constitutional:      Appearance: He is well-developed.  HENT:     Head: Normocephalic and atraumatic.     Right Ear: External ear normal.     Left Ear: External ear normal.     Nose: Nose normal.  Eyes:     General: No scleral icterus.    Conjunctiva/sclera: Conjunctivae normal.  Cardiovascular:     Rate and Rhythm: Normal rate and regular rhythm.     Heart sounds: Normal heart sounds.  Pulmonary:     Effort: Pulmonary effort is normal.  Musculoskeletal:     Right lower leg: No edema.     Left lower leg: No edema.  Skin:    General: Skin is warm and dry.    Neurological:     General: No focal deficit present.     Mental Status: He is alert and oriented to person, place, and time. Mental status is at baseline.  Psychiatric:        Behavior: Behavior normal.        Thought Content: Thought content normal.        Judgment: Judgment normal.         Assessment & Plan    I, Hurman Horn, CMA, am acting as a scribe for Wilhemena Durie, MD.  1. Essential hypertension Good control. - Comprehensive metabolic panel - TSH  2. Iron deficiency anemia, unspecified iron deficiency anemia type Follow up CBC/iron on next visit. - CBC with Differential/Platelet  3. Hypothyroidism, unspecified type   4. High cholesterol Consider stopping crestor in 85 year old. - Lipid panel 5.Edema of legs Discussed cutting back on lasix to protect kidneys.   I have done the exam and reviewed the above chart and it is accurate to the best of my knowledge. Development worker, community has been used in this note in any air is in the dictation or transcription are unintentional.  Wilhemena Durie, MD  [place]

## 2018-06-03 DIAGNOSIS — I10 Essential (primary) hypertension: Secondary | ICD-10-CM | POA: Diagnosis not present

## 2018-06-03 DIAGNOSIS — D509 Iron deficiency anemia, unspecified: Secondary | ICD-10-CM | POA: Diagnosis not present

## 2018-06-03 DIAGNOSIS — E7800 Pure hypercholesterolemia, unspecified: Secondary | ICD-10-CM | POA: Diagnosis not present

## 2018-06-04 LAB — COMPREHENSIVE METABOLIC PANEL
ALT: 6 IU/L (ref 0–44)
AST: 7 IU/L (ref 0–40)
Albumin/Globulin Ratio: 2.1 (ref 1.2–2.2)
Albumin: 3.9 g/dL (ref 3.5–4.7)
Alkaline Phosphatase: 62 IU/L (ref 39–117)
BUN/Creatinine Ratio: 21 (ref 10–24)
BUN: 22 mg/dL (ref 8–27)
Bilirubin Total: 0.4 mg/dL (ref 0.0–1.2)
CO2: 26 mmol/L (ref 20–29)
Calcium: 8.9 mg/dL (ref 8.6–10.2)
Chloride: 108 mmol/L — ABNORMAL HIGH (ref 96–106)
Creatinine, Ser: 1.03 mg/dL (ref 0.76–1.27)
GFR calc non Af Amer: 67 mL/min/{1.73_m2} (ref >59)
GFR, EST AFRICAN AMERICAN: 77 mL/min/{1.73_m2} (ref >59)
Globulin, Total: 1.9 g/dL (ref 1.5–4.5)
Glucose: 98 mg/dL (ref 65–99)
POTASSIUM: 4.8 mmol/L (ref 3.5–5.2)
Sodium: 148 mmol/L — ABNORMAL HIGH (ref 134–144)
TOTAL PROTEIN: 5.8 g/dL — AB (ref 6.0–8.5)

## 2018-06-04 LAB — CBC WITH DIFFERENTIAL/PLATELET
BASOS ABS: 0.1 10*3/uL (ref 0.0–0.2)
Basos: 1 %
EOS (ABSOLUTE): 0.3 10*3/uL (ref 0.0–0.4)
Eos: 7 %
HEMATOCRIT: 33.6 % — AB (ref 37.5–51.0)
Hemoglobin: 10.9 g/dL — ABNORMAL LOW (ref 13.0–17.7)
Immature Grans (Abs): 0 10*3/uL (ref 0.0–0.1)
Immature Granulocytes: 0 %
LYMPHS ABS: 1.2 10*3/uL (ref 0.7–3.1)
Lymphs: 24 %
MCH: 30 pg (ref 26.6–33.0)
MCHC: 32.4 g/dL (ref 31.5–35.7)
MCV: 93 fL (ref 79–97)
MONOS ABS: 0.5 10*3/uL (ref 0.1–0.9)
Monocytes: 9 %
Neutrophils Absolute: 3 10*3/uL (ref 1.4–7.0)
Neutrophils: 59 %
PLATELETS: 150 10*3/uL (ref 150–450)
RBC: 3.63 x10E6/uL — AB (ref 4.14–5.80)
RDW: 13.8 % (ref 11.6–15.4)
WBC: 5.1 10*3/uL (ref 3.4–10.8)

## 2018-06-04 LAB — LIPID PANEL
Chol/HDL Ratio: 2.4 ratio (ref 0.0–5.0)
Cholesterol, Total: 92 mg/dL — ABNORMAL LOW (ref 100–199)
HDL: 38 mg/dL — ABNORMAL LOW (ref >39)
LDL CALC: 49 mg/dL (ref 0–99)
Triglycerides: 25 mg/dL (ref 0–149)
VLDL Cholesterol Cal: 5 mg/dL (ref 5–40)

## 2018-06-04 LAB — TSH: TSH: 4.44 u[IU]/mL (ref 0.450–4.500)

## 2018-06-05 NOTE — Telephone Encounter (Signed)
yes

## 2018-06-08 MED ORDER — ROSUVASTATIN CALCIUM 20 MG PO TABS: 20.0000 mg | ORAL_TABLET | Freq: Every day | ORAL | 3 refills | Status: DC

## 2018-06-08 NOTE — Addendum Note (Signed)
Addended by: Althea Charon D on: 06/08/2018 07:18 AM   Modules accepted: Orders

## 2018-06-09 ENCOUNTER — Telehealth: Payer: Self-pay

## 2018-06-09 NOTE — Telephone Encounter (Signed)
Patient wife [name] was advised and states she will advise the patient.

## 2018-06-09 NOTE — Telephone Encounter (Signed)
-----   Message from Jerrol Banana., MD sent at 06/08/2018  2:06 PM EST ----- Stable.

## 2018-06-30 ENCOUNTER — Telehealth: Payer: Self-pay | Admitting: Family Medicine

## 2018-06-30 NOTE — Telephone Encounter (Signed)
[facility] faxed refill request for the following medications:  quinapril (ACCUPRIL) 20 MG tablet   90 day supply  Please advise. Thanks TNP

## 2018-07-01 DIAGNOSIS — D225 Melanocytic nevi of trunk: Secondary | ICD-10-CM | POA: Diagnosis not present

## 2018-07-01 DIAGNOSIS — D692 Other nonthrombocytopenic purpura: Secondary | ICD-10-CM | POA: Diagnosis not present

## 2018-07-01 DIAGNOSIS — Z85828 Personal history of other malignant neoplasm of skin: Secondary | ICD-10-CM | POA: Diagnosis not present

## 2018-07-01 DIAGNOSIS — L821 Other seborrheic keratosis: Secondary | ICD-10-CM | POA: Diagnosis not present

## 2018-07-01 DIAGNOSIS — L812 Freckles: Secondary | ICD-10-CM | POA: Diagnosis not present

## 2018-07-01 DIAGNOSIS — D229 Melanocytic nevi, unspecified: Secondary | ICD-10-CM | POA: Diagnosis not present

## 2018-07-01 DIAGNOSIS — L570 Actinic keratosis: Secondary | ICD-10-CM | POA: Diagnosis not present

## 2018-07-01 DIAGNOSIS — I8310 Varicose veins of unspecified lower extremity with inflammation: Secondary | ICD-10-CM | POA: Diagnosis not present

## 2018-07-01 DIAGNOSIS — Z1283 Encounter for screening for malignant neoplasm of skin: Secondary | ICD-10-CM | POA: Diagnosis not present

## 2018-07-03 NOTE — Telephone Encounter (Signed)
Ok thx.

## 2018-07-06 MED ORDER — QUINAPRIL HCL 20 MG PO TABS: 20.0000 mg | ORAL_TABLET | Freq: Every day | ORAL | 3 refills | Status: DC

## 2018-07-06 NOTE — Addendum Note (Signed)
Addended by: Althea Charon D on: 07/06/2018 08:10 AM   Modules accepted: Orders

## 2018-07-28 DIAGNOSIS — M25561 Pain in right knee: Secondary | ICD-10-CM | POA: Diagnosis not present

## 2018-07-28 DIAGNOSIS — M1711 Unilateral primary osteoarthritis, right knee: Secondary | ICD-10-CM | POA: Diagnosis not present

## 2018-08-03 ENCOUNTER — Encounter: Payer: Self-pay | Admitting: Family Medicine

## 2018-08-03 ENCOUNTER — Ambulatory Visit (INDEPENDENT_AMBULATORY_CARE_PROVIDER_SITE_OTHER): Payer: PPO | Admitting: Family Medicine

## 2018-08-03 VITALS — BP 126/72 | HR 68 | Temp 98.1°F | Resp 16 | Ht 74.0 in | Wt 271.0 lb

## 2018-08-03 DIAGNOSIS — I25118 Atherosclerotic heart disease of native coronary artery with other forms of angina pectoris: Secondary | ICD-10-CM | POA: Diagnosis not present

## 2018-08-03 DIAGNOSIS — M170 Bilateral primary osteoarthritis of knee: Secondary | ICD-10-CM | POA: Diagnosis not present

## 2018-08-03 DIAGNOSIS — E785 Hyperlipidemia, unspecified: Secondary | ICD-10-CM

## 2018-08-03 DIAGNOSIS — G250 Essential tremor: Secondary | ICD-10-CM | POA: Diagnosis not present

## 2018-08-03 DIAGNOSIS — Z6836 Body mass index (BMI) 36.0-36.9, adult: Secondary | ICD-10-CM

## 2018-08-03 DIAGNOSIS — D509 Iron deficiency anemia, unspecified: Secondary | ICD-10-CM

## 2018-08-03 DIAGNOSIS — R7303 Prediabetes: Secondary | ICD-10-CM

## 2018-08-03 NOTE — Progress Notes (Signed)
Patient: Jared Rice Male    DOB: December 21, 1932   85 y.o.   MRN: [id number] Visit Date: 08/03/2018  Today's Provider: Wilhemena Durie, MD   Chief Complaint  Patient presents with  . Follow-up   Subjective:     HPI  Patient comes in today for a follow up. He was last seen in the office 2 months ago. He was advised to cut back on lasix for kidney protection. He is also due to have iron levels rechecked.  He has mild anemia of chronic disease is become slightly worse over the past year.  I think at 22 this is in a normal range but will recheck today to make sure does not drop further. He is status post left total knee replacement 2012.  He had a cortisone injection his right knee last week.  Allergies  Allergen Reactions  . Dexilant [Dexlansoprazole] Rash  . Primidone Itching and Rash    rash     Current Outpatient Medications:  .  aspirin 81 MG tablet, Take 81 mg by mouth daily., Disp: , Rfl:  .  diphenhydramine-acetaminophen (TYLENOL PM) 25-500 MG TABS, Take 2 tablets by mouth at bedtime as needed., Disp: , Rfl:  .  doxazosin (CARDURA) 8 MG tablet, Take 1 tablet by mouth once daily, Disp: 90 tablet, Rfl: 3 .  furosemide (LASIX) 20 MG tablet, Take 2 tablets by mouth daily, Disp: 180 tablet, Rfl: 2 .  Iodoquinol-HC-Aloe Polysacch (ALCORTIN A) 1-2-1 % GEL, as needed. , Disp: , Rfl: 3 .  levothyroxine (SYNTHROID, LEVOTHROID) 75 MCG tablet, Take 1 tablet by mouth daily in the morning, Disp: 90 tablet, Rfl: 2 .  pantoprazole (PROTONIX) 40 MG tablet, Take 1 tablet (40 mg total) by mouth daily., Disp: 90 tablet, Rfl: 3 .  quinapril (ACCUPRIL) 20 MG tablet, Take 1 tablet (20 mg total) by mouth daily., Disp: 90 tablet, Rfl: 3 .  rosuvastatin (CRESTOR) 20 MG tablet, Take 1 tablet (20 mg total) by mouth daily., Disp: 90 tablet, Rfl: 3 .  topiramate (TOPAMAX) 50 MG tablet, Take 1 tablet (50 mg total) by mouth 2 (two) times daily., Disp: 60 tablet, Rfl: 5 .  amoxicillin (AMOXIL) 500  MG capsule, TAKE FOUR CAPSULES BY MOUTH AS A SINGLE DOSE ONE HOUR BEFORE DENTAL APPOINTMENT, Disp: , Rfl: 2 .  azithromycin (ZITHROMAX) 250 MG tablet, Take as directed on package (Patient not taking: Reported on 05/18/2018), Disp: 6 each, Rfl: 0 .  doxycycline (VIBRA-TABS) 100 MG tablet, Take 1 tablet (100 mg total) by mouth 2 (two) times daily. (Patient not taking: Reported on 04/04/2018), Disp: 14 tablet, Rfl: 0 .  furosemide (LASIX) 40 MG tablet, Take 1 tablet (40 mg total) by mouth daily. (Patient not taking: Reported on 05/18/2018), Disp: , Rfl:   Review of Systems  Constitutional: Negative for activity change, appetite change, chills, diaphoresis, fatigue, fever and unexpected weight change.  Respiratory: Negative for cough and shortness of breath.   Cardiovascular: Negative for chest pain, palpitations and leg swelling.  Musculoskeletal: Positive for arthralgias and joint swelling.  Neurological: Negative for dizziness, light-headedness, numbness and headaches.    Social History   Tobacco Use  . Smoking status: Former Smoker    Packs/day: 2.00    Years: 31.00    Pack years: 62.00    Types: Cigarettes    Last attempt to quit: 12/24/1980    Years since quitting: 37.6  . Smokeless tobacco: Never Used  . Tobacco comment: quit  in 1982  Substance Use Topics  . Alcohol use: No    Alcohol/week: 0.0 standard drinks      Objective:   BP 126/72 (BP Location: Left Arm, Patient Position: Sitting, Cuff Size: Large)   Pulse 68   Temp 98.1 F (36.7 C)   Resp 16   Ht 6\' 2"  (1.88 m)   Wt 271 lb (122.9 kg)   BMI 34.79 kg/m  Vitals:   08/03/18 1347  BP: 126/72  Pulse: 68  Resp: 16  Temp: 98.1 F (36.7 C)  Weight: 271 lb (122.9 kg)  Height: 6\' 2"  (1.88 m)     Physical Exam Vitals signs reviewed.  Constitutional:      Appearance: He is obese.  HENT:     Head: Normocephalic and atraumatic.     Right Ear: External ear normal.     Left Ear: External ear normal.     Nose: Nose  normal.  Eyes:     General: No scleral icterus.    Conjunctiva/sclera: Conjunctivae normal.  Cardiovascular:     Rate and Rhythm: Normal rate and regular rhythm.     Heart sounds: Normal heart sounds.  Pulmonary:     Effort: Pulmonary effort is normal.     Breath sounds: Normal breath sounds.  Abdominal:     Palpations: Abdomen is soft.  Musculoskeletal:     Right lower leg: Edema present.     Left lower leg: Edema present.     Comments: Trace lower extremity edema with support hose.  Skin:    General: Skin is warm and dry.  Neurological:     General: No focal deficit present.     Mental Status: He is alert and oriented to person, place, and time. Mental status is at baseline.  Psychiatric:        Mood and Affect: Mood normal.        Behavior: Behavior normal.        Thought Content: Thought content normal.        Judgment: Judgment normal.         Assessment & Plan     1. Iron deficiency anemia, unspecified iron deficiency anemia type  - CBC with Differential/Platelet  2. Atherosclerosis of native coronary artery of native heart with stable angina pectoris ([place]) All risk factors treated.  3. Benign essential tremor Slowly getting worse.  No other symptoms.  4. Hyperlipidemia, unspecified hyperlipidemia type   5. Borderline diabetes Follow A1c when appropriate.  6. Class 2 severe obesity due to excess calories with serious comorbidity and body mass index (BMI) of 36.0 to 36.9 in adult [facility]) With heart disease, hypertension, prediabetes,OA. 7.OA    Wilhemena Durie, MD  [facility]

## 2018-08-04 ENCOUNTER — Telehealth: Payer: Self-pay

## 2018-08-04 LAB — CBC WITH DIFFERENTIAL/PLATELET
Basophils Absolute: 0.1 10*3/uL (ref 0.0–0.2)
Basos: 1 %
EOS (ABSOLUTE): 0.6 10*3/uL — ABNORMAL HIGH (ref 0.0–0.4)
Eos: 8 %
HEMOGLOBIN: 11.2 g/dL — AB (ref 13.0–17.7)
Hematocrit: 35.5 % — ABNORMAL LOW (ref 37.5–51.0)
Immature Grans (Abs): 0 10*3/uL (ref 0.0–0.1)
Immature Granulocytes: 0 %
LYMPHS ABS: 1.3 10*3/uL (ref 0.7–3.1)
Lymphs: 16 %
MCH: 29.9 pg (ref 26.6–33.0)
MCHC: 31.5 g/dL (ref 31.5–35.7)
MCV: 95 fL (ref 79–97)
Monocytes Absolute: 0.7 10*3/uL (ref 0.1–0.9)
Monocytes: 8 %
Neutrophils Absolute: 5.5 10*3/uL (ref 1.4–7.0)
Neutrophils: 67 %
Platelets: 158 10*3/uL (ref 150–450)
RBC: 3.74 x10E6/uL — ABNORMAL LOW (ref 4.14–5.80)
RDW: 13.3 % (ref 11.6–15.4)
WBC: 8.1 10*3/uL (ref 3.4–10.8)

## 2018-08-04 NOTE — Telephone Encounter (Signed)
Patient advised as directed below.  Thanks,  -Joseline 

## 2018-08-04 NOTE — Telephone Encounter (Signed)
-----   Message from Jerrol Banana., MD sent at 08/04/2018  8:21 AM EDT ----- Blood count stable.

## 2018-09-30 DIAGNOSIS — R21 Rash and other nonspecific skin eruption: Secondary | ICD-10-CM | POA: Diagnosis not present

## 2018-10-28 DIAGNOSIS — M1711 Unilateral primary osteoarthritis, right knee: Secondary | ICD-10-CM | POA: Diagnosis not present

## 2018-11-02 DIAGNOSIS — L239 Allergic contact dermatitis, unspecified cause: Secondary | ICD-10-CM | POA: Diagnosis not present

## 2018-11-02 DIAGNOSIS — R21 Rash and other nonspecific skin eruption: Secondary | ICD-10-CM | POA: Diagnosis not present

## 2018-11-05 DIAGNOSIS — M7581 Other shoulder lesions, right shoulder: Secondary | ICD-10-CM | POA: Diagnosis not present

## 2018-11-06 ENCOUNTER — Other Ambulatory Visit: Payer: Self-pay | Admitting: Family Medicine

## 2018-11-06 NOTE — Telephone Encounter (Signed)
Please review

## 2018-11-25 DIAGNOSIS — M79674 Pain in right toe(s): Secondary | ICD-10-CM | POA: Diagnosis not present

## 2018-11-25 DIAGNOSIS — M79675 Pain in left toe(s): Secondary | ICD-10-CM | POA: Diagnosis not present

## 2018-11-25 DIAGNOSIS — B351 Tinea unguium: Secondary | ICD-10-CM | POA: Diagnosis not present

## 2018-12-02 DIAGNOSIS — R21 Rash and other nonspecific skin eruption: Secondary | ICD-10-CM | POA: Diagnosis not present

## 2018-12-04 DIAGNOSIS — R21 Rash and other nonspecific skin eruption: Secondary | ICD-10-CM | POA: Diagnosis not present

## 2018-12-07 DIAGNOSIS — L508 Other urticaria: Secondary | ICD-10-CM | POA: Diagnosis not present

## 2018-12-07 DIAGNOSIS — R21 Rash and other nonspecific skin eruption: Secondary | ICD-10-CM | POA: Diagnosis not present

## 2018-12-07 DIAGNOSIS — L309 Dermatitis, unspecified: Secondary | ICD-10-CM | POA: Diagnosis not present

## 2018-12-10 ENCOUNTER — Other Ambulatory Visit: Payer: Self-pay | Admitting: Gastroenterology

## 2018-12-10 DIAGNOSIS — K253 Acute gastric ulcer without hemorrhage or perforation: Secondary | ICD-10-CM

## 2018-12-10 DIAGNOSIS — B9681 Helicobacter pylori [H. pylori] as the cause of diseases classified elsewhere: Secondary | ICD-10-CM

## 2018-12-21 ENCOUNTER — Ambulatory Visit: Payer: Self-pay | Admitting: Family Medicine

## 2018-12-30 ENCOUNTER — Encounter: Payer: Self-pay | Admitting: Family Medicine

## 2018-12-30 ENCOUNTER — Other Ambulatory Visit: Payer: Self-pay

## 2018-12-30 ENCOUNTER — Ambulatory Visit (INDEPENDENT_AMBULATORY_CARE_PROVIDER_SITE_OTHER): Payer: PPO | Admitting: Family Medicine

## 2018-12-30 ENCOUNTER — Ambulatory Visit: Payer: Self-pay | Admitting: Family Medicine

## 2018-12-30 VITALS — BP 112/72 | HR 82 | Temp 98.3°F | Resp 16 | Wt 270.0 lb

## 2018-12-30 DIAGNOSIS — F4320 Adjustment disorder, unspecified: Secondary | ICD-10-CM

## 2018-12-30 DIAGNOSIS — E039 Hypothyroidism, unspecified: Secondary | ICD-10-CM | POA: Diagnosis not present

## 2018-12-30 DIAGNOSIS — T7840XA Allergy, unspecified, initial encounter: Secondary | ICD-10-CM | POA: Diagnosis not present

## 2018-12-30 DIAGNOSIS — E785 Hyperlipidemia, unspecified: Secondary | ICD-10-CM | POA: Diagnosis not present

## 2018-12-30 DIAGNOSIS — I10 Essential (primary) hypertension: Secondary | ICD-10-CM

## 2018-12-30 DIAGNOSIS — K219 Gastro-esophageal reflux disease without esophagitis: Secondary | ICD-10-CM

## 2018-12-30 NOTE — Progress Notes (Signed)
Patient: Jared Rice Male    DOB: 09/12/1932   85 y.o.   MRN: [id number] Visit Date: 12/30/2018  Today's Provider: Wilhemena Durie, MD   Chief Complaint  Patient presents with  . Follow-up  . Hypertension  . Hypothyroidism  . Hyperlipidemia  . Gastroesophageal Reflux   Subjective:   HPI Patient comes in today for a follow up. He was last seen in the office 5 months ago. No medications were changed since last OV. He reports that he is currently on prednisone temporarily due to a dermatitis issue. This was prescribed by his dermatologist.  He is worried that his wife is developing dementia.  BP Readings from Last 3 Encounters:  12/30/18 112/72  08/03/18 126/72  06/02/18 119/61   Wt Readings from Last 3 Encounters:  12/30/18 270 lb (122.5 kg)  08/03/18 271 lb (122.9 kg)  06/02/18 270 lb (122.5 kg)    Allergies  Allergen Reactions  . Dexilant [Dexlansoprazole] Rash  . Primidone Itching and Rash    rash     Current Outpatient Medications:  .  aspirin 81 MG tablet, Take 81 mg by mouth daily., Disp: , Rfl:  .  diphenhydramine-acetaminophen (TYLENOL PM) 25-500 MG TABS, Take 2 tablets by mouth at bedtime as needed., Disp: , Rfl:  .  doxazosin (CARDURA) 8 MG tablet, Take 1 tablet by mouth once daily, Disp: 90 tablet, Rfl: 3 .  furosemide (LASIX) 20 MG tablet, Take 2 tablets by mouth daily, Disp: 180 tablet, Rfl: 2 .  Iodoquinol-HC-Aloe Polysacch (ALCORTIN A) 1-2-1 % GEL, as needed. , Disp: , Rfl: 3 .  levothyroxine (SYNTHROID, LEVOTHROID) 75 MCG tablet, Take 1 tablet by mouth daily in the morning, Disp: 90 tablet, Rfl: 2 .  pantoprazole (PROTONIX) 40 MG tablet, Take 1 tablet by mouth once daily, Disp: 90 tablet, Rfl: 1 .  quinapril (ACCUPRIL) 20 MG tablet, Take 1 tablet (20 mg total) by mouth daily., Disp: 90 tablet, Rfl: 3 .  rosuvastatin (CRESTOR) 20 MG tablet, Take 1 tablet (20 mg total) by mouth daily., Disp: 90 tablet, Rfl: 3 .  topiramate (TOPAMAX) 50 MG  tablet, Take 1 tablet by mouth twice daily, Disp: 60 tablet, Rfl: 5 .  amoxicillin (AMOXIL) 500 MG capsule, TAKE FOUR CAPSULES BY MOUTH AS A SINGLE DOSE ONE HOUR BEFORE DENTAL APPOINTMENT, Disp: , Rfl: 2 .  azithromycin (ZITHROMAX) 250 MG tablet, Take as directed on package (Patient not taking: Reported on 05/18/2018), Disp: 6 each, Rfl: 0 .  doxycycline (VIBRA-TABS) 100 MG tablet, Take 1 tablet (100 mg total) by mouth 2 (two) times daily. (Patient not taking: Reported on 04/04/2018), Disp: 14 tablet, Rfl: 0 .  furosemide (LASIX) 40 MG tablet, Take 1 tablet (40 mg total) by mouth daily. (Patient not taking: Reported on 05/18/2018), Disp: , Rfl:   Review of Systems  Constitutional: Negative.   HENT: Negative.   Eyes: Negative.   Respiratory: Negative.   Cardiovascular: Positive for leg swelling.  Endocrine: Negative.   Musculoskeletal: Negative.   Allergic/Immunologic: Negative.   Neurological: Negative.   Hematological: Negative.   Psychiatric/Behavioral: Negative.     Social History   Tobacco Use  . Smoking status: Former Smoker    Packs/day: 2.00    Years: 31.00    Pack years: 62.00    Types: Cigarettes    Quit date: 12/24/1980    Years since quitting: 38.0  . Smokeless tobacco: Never Used  . Tobacco comment: quit in 1982  Substance Use Topics  . Alcohol use: No    Alcohol/week: 0.0 standard drinks      Objective:   BP 112/72   Pulse 82   Temp 98.3 F (36.8 C)   Resp 16   Wt 270 lb (122.5 kg)   SpO2 99%   BMI 34.67 kg/m  Vitals:   12/30/18 1544  BP: 112/72  Pulse: 82  Resp: 16  Temp: 98.3 F (36.8 C)  SpO2: 99%  Weight: 270 lb (122.5 kg)     Physical Exam Vitals signs reviewed.  Constitutional:      Appearance: He is well-developed.  HENT:     Head: Normocephalic and atraumatic.     Right Ear: External ear normal.     Left Ear: External ear normal.     Nose: Nose normal.  Eyes:     General: No scleral icterus.    Conjunctiva/sclera: Conjunctivae  normal.  Cardiovascular:     Rate and Rhythm: Normal rate and regular rhythm.     Heart sounds: Normal heart sounds.  Pulmonary:     Effort: Pulmonary effort is normal.  Abdominal:     Palpations: Abdomen is soft.  Musculoskeletal:     Right lower leg: No edema.     Left lower leg: No edema.  Skin:    General: Skin is warm and dry.  Neurological:     General: No focal deficit present.     Mental Status: He is alert and oriented to person, place, and time. Mental status is at baseline.  Psychiatric:        Mood and Affect: Mood normal.        Behavior: Behavior normal.        Thought Content: Thought content normal.        Judgment: Judgment normal.      No results found for any visits on 12/30/18.     Assessment & Plan    1. Essential hypertension Stable. Recheck in 4 months.   2. Hyperlipidemia, unspecified hyperlipidemia type   3. Hypothyroidism, unspecified type   4. Gastroesophageal reflux disease, esophagitis presence not specified   5. Adjustment disorder, unspecified type Stable--pt worried about his wife.    I, Leonore Presley, CMA, am acting as a Education administrator for [employer]. Rosanna Randy, [place]   Wilhemena Durie, MD  [facility]

## 2019-01-08 ENCOUNTER — Other Ambulatory Visit: Payer: Self-pay | Admitting: Family Medicine

## 2019-01-08 DIAGNOSIS — M7989 Other specified soft tissue disorders: Secondary | ICD-10-CM

## 2019-01-08 DIAGNOSIS — R600 Localized edema: Secondary | ICD-10-CM

## 2019-01-08 NOTE — Telephone Encounter (Signed)
[facility] (was [facility])  faxed refill request for the following medications:  furosemide (LASIX) 20 MG tablet   Please advise.

## 2019-01-11 MED ORDER — FUROSEMIDE 40 MG PO TABS: 40.0000 mg | ORAL_TABLET | Freq: Every day | ORAL | 11 refills | Status: DC

## 2019-01-11 NOTE — Telephone Encounter (Signed)
Please review. Thanks!  

## 2019-01-21 ENCOUNTER — Other Ambulatory Visit: Payer: Self-pay | Admitting: Family Medicine

## 2019-01-21 NOTE — Telephone Encounter (Signed)
Please review

## 2019-01-21 NOTE — Telephone Encounter (Signed)
[facility] faxed refill request for the following medications:  doxazosin (CARDURA) 8 MG tablet  levothyroxine (SYNTHROID, LEVOTHROID) 75 MCG tablet   Please advise.

## 2019-01-22 ENCOUNTER — Telehealth: Payer: Self-pay

## 2019-01-22 MED ORDER — LEVOTHYROXINE SODIUM 75 MCG PO TABS: ORAL_TABLET | ORAL | 2 refills | Status: DC

## 2019-01-22 MED ORDER — DOXAZOSIN MESYLATE 8 MG PO TABS: 8.0000 mg | ORAL_TABLET | Freq: Every day | ORAL | 3 refills | Status: DC

## 2019-01-22 NOTE — Telephone Encounter (Signed)
Patient called stating that pharmacy never got the prescription for Furosemide. I called pharmacy spoke to pharmacist verified prescription was not received, and gave a verbal order.

## 2019-02-03 ENCOUNTER — Other Ambulatory Visit: Payer: Self-pay

## 2019-02-03 ENCOUNTER — Encounter: Payer: Self-pay | Admitting: Family Medicine

## 2019-02-03 ENCOUNTER — Ambulatory Visit (INDEPENDENT_AMBULATORY_CARE_PROVIDER_SITE_OTHER): Payer: PPO | Admitting: Family Medicine

## 2019-02-03 VITALS — BP 80/48 | HR 86 | Temp 97.5°F | Resp 16 | Wt 274.6 lb

## 2019-02-03 DIAGNOSIS — E119 Type 2 diabetes mellitus without complications: Secondary | ICD-10-CM

## 2019-02-03 DIAGNOSIS — Z6836 Body mass index (BMI) 36.0-36.9, adult: Secondary | ICD-10-CM | POA: Diagnosis not present

## 2019-02-03 DIAGNOSIS — J410 Simple chronic bronchitis: Secondary | ICD-10-CM

## 2019-02-03 DIAGNOSIS — L02416 Cutaneous abscess of left lower limb: Secondary | ICD-10-CM

## 2019-02-03 DIAGNOSIS — L03116 Cellulitis of left lower limb: Secondary | ICD-10-CM | POA: Diagnosis not present

## 2019-02-03 NOTE — Progress Notes (Signed)
Patient: Jared Rice Male    DOB: [birth year]   85 y.o.   MRN: [id number] Visit Date: 02/03/2019  Today's Provider: Wilhemena Durie, MD   Chief Complaint  Patient presents with  . Leg Swelling   Subjective:     HPI Patient here today C/O lesion on left leg worse than right. Patient reports symptoms have been present for about 3-4 days. Patient started taking Keflex and Bactrim on Sunday, prescribed by a PA. Patient reports medications have not helped lesion to improve. Patient reports pain, redness and some clear discharge.  Allergies  Allergen Reactions  . Dexilant [Dexlansoprazole] Rash  . Primidone Itching and Rash    rash     Current Outpatient Medications:  .  aspirin 81 MG tablet, Take 81 mg by mouth daily., Disp: , Rfl:  .  cephALEXin (KEFLEX) 500 MG capsule, Take 500 mg by mouth 2 (two) times daily., Disp: , Rfl:  .  diphenhydramine-acetaminophen (TYLENOL PM) 25-500 MG TABS, Take 2 tablets by mouth at bedtime as needed., Disp: , Rfl:  .  doxazosin (CARDURA) 8 MG tablet, Take 1 tablet (8 mg total) by mouth daily., Disp: 90 tablet, Rfl: 3 .  furosemide (LASIX) 20 MG tablet, Take 2 tablets by mouth daily, Disp: 180 tablet, Rfl: 2 .  furosemide (LASIX) 40 MG tablet, Take 1 tablet (40 mg total) by mouth daily., Disp: 30 tablet, Rfl: 11 .  Iodoquinol-HC-Aloe Polysacch (ALCORTIN A) 1-2-1 % GEL, as needed. , Disp: , Rfl: 3 .  levothyroxine (SYNTHROID) 75 MCG tablet, Take 1 tablet by mouth daily in the morning, Disp: 90 tablet, Rfl: 2 .  pantoprazole (PROTONIX) 40 MG tablet, Take 1 tablet by mouth once daily, Disp: 90 tablet, Rfl: 1 .  quinapril (ACCUPRIL) 20 MG tablet, Take 1 tablet (20 mg total) by mouth daily., Disp: 90 tablet, Rfl: 3 .  rosuvastatin (CRESTOR) 20 MG tablet, Take 1 tablet (20 mg total) by mouth daily., Disp: 90 tablet, Rfl: 3 .  sulfamethoxazole-trimethoprim (BACTRIM DS) 800-160 MG tablet, Take 1 tablet by mouth 2 (two) times daily., Disp: , Rfl:  .   topiramate (TOPAMAX) 50 MG tablet, Take 1 tablet by mouth twice daily, Disp: 60 tablet, Rfl: 5  Review of Systems  Constitutional: Negative.   Respiratory: Negative.   Cardiovascular: Negative.   Endocrine: Negative.   Skin: Positive for color change, rash and wound.  Allergic/Immunologic: Negative.   Hematological: Negative.   Psychiatric/Behavioral: Negative.     Social History   Tobacco Use  . Smoking status: Former Smoker    Packs/day: 2.00    Years: 31.00    Pack years: 62.00    Types: Cigarettes    Quit date: 12/24/1980    Years since quitting: 38.1  . Smokeless tobacco: Never Used  . Tobacco comment: quit in 1982  Substance Use Topics  . Alcohol use: No    Alcohol/week: 0.0 standard drinks      Objective:   BP (!) 80/48 (BP Location: Right Arm, Patient Position: Sitting, Cuff Size: Large)   Pulse 86   Temp (!) 97.5 F (36.4 C) (Temporal)   Resp 16   Wt 274 lb 9.6 oz (124.6 kg)   BMI 35.26 kg/m  Vitals:   02/03/19 1543  BP: (!) 80/48  Pulse: 86  Resp: 16  Temp: (!) 97.5 F (36.4 C)  TempSrc: Temporal  Weight: 274 lb 9.6 oz (124.6 kg)  Body mass index is 35.26 kg/m.  Physical Exam Vitals signs reviewed.  Constitutional:      Appearance: He is obese.  HENT:     Head: Normocephalic and atraumatic.     Right Ear: External ear normal.     Left Ear: External ear normal.  Eyes:     General: No scleral icterus. Cardiovascular:     Rate and Rhythm: Normal rate and regular rhythm.     Heart sounds: Normal heart sounds.  Pulmonary:     Effort: Pulmonary effort is normal.     Breath sounds: Normal breath sounds.  Abdominal:     Palpations: Abdomen is soft.  Musculoskeletal:     Right lower leg: Edema present.     Left lower leg: Edema present.     Comments: 1+ LE edema  Skin:    General: Skin is warm and dry.     Findings: Erythema and lesion present.     Comments: 1 inch lesion on right lower anterior leg which is incised with very little  drainage.Culture obtained. Prepped with betadine,topical anethestic.Incised with 11 blade.  Neurological:     General: No focal deficit present.     Mental Status: He is alert and oriented to person, place, and time.  Psychiatric:        Mood and Affect: Mood normal.        Behavior: Behavior normal.        Thought Content: Thought content normal.        Judgment: Judgment normal.      No results found for any visits on 02/03/19.     Assessment & Plan    1. Cellulitis and abscess of left leg Incised and drained.Continue antibiotics as it has improved. RTC next week. - WOUND CULTURE - Aerobic culture  2. Simple chronic bronchitis ([place])   3. Type 2 diabetes mellitus without complication, without long-term current use of insulin ([facility])   4. Class 2 severe obesity due to excess calories with serious comorbidity and body mass index (BMI) of 36.0 to 36.9 in adult [facility])      Wilhemena Durie, MD  [facility]

## 2019-02-05 DIAGNOSIS — M7581 Other shoulder lesions, right shoulder: Secondary | ICD-10-CM | POA: Diagnosis not present

## 2019-02-05 DIAGNOSIS — M1711 Unilateral primary osteoarthritis, right knee: Secondary | ICD-10-CM | POA: Diagnosis not present

## 2019-02-09 ENCOUNTER — Other Ambulatory Visit: Payer: Self-pay

## 2019-02-09 ENCOUNTER — Encounter: Payer: Self-pay | Admitting: Family Medicine

## 2019-02-09 ENCOUNTER — Ambulatory Visit: Payer: PPO | Admitting: Family Medicine

## 2019-02-09 VITALS — BP 108/62 | HR 80 | Temp 98.2°F | Resp 20 | Wt 266.0 lb

## 2019-02-09 DIAGNOSIS — L02416 Cutaneous abscess of left lower limb: Secondary | ICD-10-CM

## 2019-02-09 DIAGNOSIS — R600 Localized edema: Secondary | ICD-10-CM

## 2019-02-09 DIAGNOSIS — L03116 Cellulitis of left lower limb: Secondary | ICD-10-CM

## 2019-02-09 DIAGNOSIS — E119 Type 2 diabetes mellitus without complications: Secondary | ICD-10-CM

## 2019-02-09 DIAGNOSIS — I25118 Atherosclerotic heart disease of native coronary artery with other forms of angina pectoris: Secondary | ICD-10-CM

## 2019-02-09 DIAGNOSIS — Z6836 Body mass index (BMI) 36.0-36.9, adult: Secondary | ICD-10-CM

## 2019-02-09 LAB — AEROBIC CULTURE

## 2019-02-09 NOTE — Progress Notes (Signed)
Patient: Jared Rice Male    DOB: 02/12/1933   85 y.o.   MRN: [id number] Visit Date: 02/09/2019  Today's Provider: Wilhemena Durie, MD   Chief Complaint  Patient presents with  . Follow-up  . Cellulitis   Subjective:   HPI  Patient comes in today for a follow up on cellulitis. He was last seen in the office 1 week ago. He reports that he just finished his abx yesterday. He reports that his symptoms are improving. His leg is feeling much better.  Wt Readings from Last 3 Encounters:  02/09/19 266 lb (120.7 kg)  02/03/19 274 lb 9.6 oz (124.6 kg)  12/30/18 270 lb (122.5 kg)   BP Readings from Last 3 Encounters:  02/09/19 108/62  02/03/19 (!) 80/48  12/30/18 112/72    Allergies  Allergen Reactions  . Dexilant [Dexlansoprazole] Rash  . Primidone Itching and Rash    rash     Current Outpatient Medications:  .  aspirin 81 MG tablet, Take 81 mg by mouth daily., Disp: , Rfl:  .  diphenhydramine-acetaminophen (TYLENOL PM) 25-500 MG TABS, Take 2 tablets by mouth at bedtime as needed., Disp: , Rfl:  .  doxazosin (CARDURA) 8 MG tablet, Take 1 tablet (8 mg total) by mouth daily., Disp: 90 tablet, Rfl: 3 .  furosemide (LASIX) 40 MG tablet, Take 1 tablet (40 mg total) by mouth daily., Disp: 30 tablet, Rfl: 11 .  Iodoquinol-HC-Aloe Polysacch (ALCORTIN A) 1-2-1 % GEL, as needed. , Disp: , Rfl: 3 .  levothyroxine (SYNTHROID) 75 MCG tablet, Take 1 tablet by mouth daily in the morning, Disp: 90 tablet, Rfl: 2 .  pantoprazole (PROTONIX) 40 MG tablet, Take 1 tablet by mouth once daily, Disp: 90 tablet, Rfl: 1 .  quinapril (ACCUPRIL) 20 MG tablet, Take 1 tablet (20 mg total) by mouth daily., Disp: 90 tablet, Rfl: 3 .  rosuvastatin (CRESTOR) 20 MG tablet, Take 1 tablet (20 mg total) by mouth daily., Disp: 90 tablet, Rfl: 3 .  topiramate (TOPAMAX) 50 MG tablet, Take 1 tablet by mouth twice daily, Disp: 60 tablet, Rfl: 5 .  cephALEXin (KEFLEX) 500 MG capsule, Take 500 mg by mouth 2  (two) times daily., Disp: , Rfl:  .  furosemide (LASIX) 20 MG tablet, Take 2 tablets by mouth daily (Patient not taking: Reported on 02/09/2019), Disp: 180 tablet, Rfl: 2 .  sulfamethoxazole-trimethoprim (BACTRIM DS) 800-160 MG tablet, Take 1 tablet by mouth 2 (two) times daily., Disp: , Rfl:   Review of Systems  Constitutional: Negative for activity change and fatigue.  Cardiovascular: Positive for leg swelling. Negative for chest pain and palpitations.  Musculoskeletal: Positive for myalgias. Negative for arthralgias.  Skin: Positive for color change. Negative for rash and wound.  Allergic/Immunologic: Negative.   Hematological: Negative.   Psychiatric/Behavioral: Negative for agitation, self-injury, sleep disturbance and suicidal ideas. The patient is not nervous/anxious.     Social History   Tobacco Use  . Smoking status: Former Smoker    Packs/day: 2.00    Years: 31.00    Pack years: 62.00    Types: Cigarettes    Quit date: 12/24/1980    Years since quitting: 38.1  . Smokeless tobacco: Never Used  . Tobacco comment: quit in 1982  Substance Use Topics  . Alcohol use: No    Alcohol/week: 0.0 standard drinks      Objective:   BP 108/62   Pulse 80   Temp 98.2 F (36.8 C)  Resp 20   Wt 266 lb (120.7 kg)   SpO2 96%   BMI 34.15 kg/m  Vitals:   02/09/19 1622  BP: 108/62  Pulse: 80  Resp: 20  Temp: 98.2 F (36.8 C)  SpO2: 96%  Weight: 266 lb (120.7 kg)  Body mass index is 34.15 kg/m.   Physical Exam Vitals signs reviewed.  Constitutional:      Appearance: He is obese.  HENT:     Head: Normocephalic and atraumatic.     Right Ear: External ear normal.     Left Ear: External ear normal.  Eyes:     General: No scleral icterus. Cardiovascular:     Rate and Rhythm: Normal rate and regular rhythm.     Heart sounds: Normal heart sounds.  Pulmonary:     Effort: Pulmonary effort is normal.     Breath sounds: Normal breath sounds.  Abdominal:     Palpations:  Abdomen is soft.  Musculoskeletal:     Right lower leg: Edema present.     Left lower leg: Edema present.     Comments: 1+ LE edema  Skin:    General: Skin is warm and dry.     Findings: Erythema and lesion present.     Comments: Leg much improved and healing nicely. No present signs of infection.  Neurological:     General: No focal deficit present.     Mental Status: He is alert and oriented to person, place, and time.  Psychiatric:        Mood and Affect: Mood normal.        Behavior: Behavior normal.        Thought Content: Thought content normal.        Judgment: Judgment normal.      No results found for any visits on 02/09/19.     Assessment & Plan    1. Cellulitis and abscess of left leg Much improved.  2. Class 2 severe obesity due to excess calories with serious comorbidity and body mass index (BMI) of 36.0 to 36.9 in adult [facility])   3. Atherosclerosis of native coronary artery of native heart with stable angina pectoris ([place])   4. Edema of both legs Improved. Support hose.  5. Type 2 diabetes mellitus without complication, without long-term current use of insulin ([place])      Wilhemena Durie, MD  [facility]

## 2019-02-24 ENCOUNTER — Emergency Department
Admission: EM | Admit: 2019-02-24 | Discharge: 2019-02-25 | Disposition: A | Discharge status: Other Acute Inpatient Hospital | Payer: PPO | Attending: Emergency Medicine | Admitting: Emergency Medicine

## 2019-02-24 ENCOUNTER — Emergency Department: Payer: PPO

## 2019-02-24 ENCOUNTER — Encounter: Payer: Self-pay | Admitting: Emergency Medicine

## 2019-02-24 ENCOUNTER — Ambulatory Visit (INDEPENDENT_AMBULATORY_CARE_PROVIDER_SITE_OTHER): Payer: PPO

## 2019-02-24 ENCOUNTER — Other Ambulatory Visit: Payer: Self-pay

## 2019-02-24 DIAGNOSIS — Y92410 Unspecified street and highway as the place of occurrence of the external cause: Secondary | ICD-10-CM | POA: Diagnosis not present

## 2019-02-24 DIAGNOSIS — S7012XA Contusion of left thigh, initial encounter: Secondary | ICD-10-CM | POA: Diagnosis not present

## 2019-02-24 DIAGNOSIS — S3991XA Unspecified injury of abdomen, initial encounter: Secondary | ICD-10-CM | POA: Diagnosis not present

## 2019-02-24 DIAGNOSIS — I4891 Unspecified atrial fibrillation: Secondary | ICD-10-CM | POA: Diagnosis not present

## 2019-02-24 DIAGNOSIS — Z79899 Other long term (current) drug therapy: Secondary | ICD-10-CM | POA: Diagnosis not present

## 2019-02-24 DIAGNOSIS — S8992XA Unspecified injury of left lower leg, initial encounter: Secondary | ICD-10-CM | POA: Diagnosis not present

## 2019-02-24 DIAGNOSIS — S299XXA Unspecified injury of thorax, initial encounter: Secondary | ICD-10-CM | POA: Diagnosis not present

## 2019-02-24 DIAGNOSIS — S2222XA Fracture of body of sternum, initial encounter for closed fracture: Secondary | ICD-10-CM | POA: Diagnosis not present

## 2019-02-24 DIAGNOSIS — I25119 Atherosclerotic heart disease of native coronary artery with unspecified angina pectoris: Secondary | ICD-10-CM | POA: Diagnosis not present

## 2019-02-24 DIAGNOSIS — S8012XA Contusion of left lower leg, initial encounter: Secondary | ICD-10-CM | POA: Diagnosis not present

## 2019-02-24 DIAGNOSIS — R2242 Localized swelling, mass and lump, left lower limb: Secondary | ICD-10-CM | POA: Diagnosis not present

## 2019-02-24 DIAGNOSIS — S80811A Abrasion, right lower leg, initial encounter: Secondary | ICD-10-CM | POA: Insufficient documentation

## 2019-02-24 DIAGNOSIS — E039 Hypothyroidism, unspecified: Secondary | ICD-10-CM | POA: Diagnosis not present

## 2019-02-24 DIAGNOSIS — Z20828 Contact with and (suspected) exposure to other viral communicable diseases: Secondary | ICD-10-CM | POA: Insufficient documentation

## 2019-02-24 DIAGNOSIS — R079 Chest pain, unspecified: Secondary | ICD-10-CM

## 2019-02-24 DIAGNOSIS — S7002XA Contusion of left hip, initial encounter: Secondary | ICD-10-CM | POA: Insufficient documentation

## 2019-02-24 DIAGNOSIS — Y999 Unspecified external cause status: Secondary | ICD-10-CM | POA: Diagnosis not present

## 2019-02-24 DIAGNOSIS — S8011XA Contusion of right lower leg, initial encounter: Secondary | ICD-10-CM | POA: Diagnosis not present

## 2019-02-24 DIAGNOSIS — M7989 Other specified soft tissue disorders: Secondary | ICD-10-CM | POA: Diagnosis not present

## 2019-02-24 DIAGNOSIS — S128XXA Fracture of other parts of neck, initial encounter: Secondary | ICD-10-CM

## 2019-02-24 DIAGNOSIS — Z87891 Personal history of nicotine dependence: Secondary | ICD-10-CM | POA: Diagnosis not present

## 2019-02-24 DIAGNOSIS — Z23 Encounter for immunization: Secondary | ICD-10-CM

## 2019-02-24 DIAGNOSIS — Y9389 Activity, other specified: Secondary | ICD-10-CM | POA: Insufficient documentation

## 2019-02-24 DIAGNOSIS — Z7982 Long term (current) use of aspirin: Secondary | ICD-10-CM | POA: Diagnosis not present

## 2019-02-24 DIAGNOSIS — I10 Essential (primary) hypertension: Secondary | ICD-10-CM | POA: Diagnosis not present

## 2019-02-24 DIAGNOSIS — T07XXXA Unspecified multiple injuries, initial encounter: Secondary | ICD-10-CM

## 2019-02-24 DIAGNOSIS — T792XXA Traumatic secondary and recurrent hemorrhage and seroma, initial encounter: Secondary | ICD-10-CM | POA: Diagnosis not present

## 2019-02-24 DIAGNOSIS — J449 Chronic obstructive pulmonary disease, unspecified: Secondary | ICD-10-CM | POA: Diagnosis not present

## 2019-02-24 DIAGNOSIS — M542 Cervicalgia: Secondary | ICD-10-CM | POA: Diagnosis not present

## 2019-02-24 DIAGNOSIS — R0789 Other chest pain: Secondary | ICD-10-CM | POA: Diagnosis not present

## 2019-02-24 DIAGNOSIS — S8991XA Unspecified injury of right lower leg, initial encounter: Secondary | ICD-10-CM | POA: Diagnosis not present

## 2019-02-24 DIAGNOSIS — S12491A Other nondisplaced fracture of fifth cervical vertebra, initial encounter for closed fracture: Secondary | ICD-10-CM | POA: Diagnosis not present

## 2019-02-24 DIAGNOSIS — E119 Type 2 diabetes mellitus without complications: Secondary | ICD-10-CM | POA: Insufficient documentation

## 2019-02-24 DIAGNOSIS — S3993XA Unspecified injury of pelvis, initial encounter: Secondary | ICD-10-CM | POA: Diagnosis not present

## 2019-02-24 DIAGNOSIS — S0990XA Unspecified injury of head, initial encounter: Secondary | ICD-10-CM | POA: Diagnosis not present

## 2019-02-24 DIAGNOSIS — T148XXA Other injury of unspecified body region, initial encounter: Secondary | ICD-10-CM

## 2019-02-24 LAB — CBC WITH DIFFERENTIAL/PLATELET
Abs Immature Granulocytes: 0.07 K/uL (ref 0.00–0.07)
Basophils Absolute: 0 K/uL (ref 0.0–0.1)
Basophils Relative: 0 %
Eosinophils Absolute: 0.1 K/uL (ref 0.0–0.5)
Eosinophils Relative: 1 %
HCT: 36.3 % — ABNORMAL LOW (ref 39.0–52.0)
Hemoglobin: 11.4 g/dL — ABNORMAL LOW (ref 13.0–17.0)
Immature Granulocytes: 0 %
Lymphocytes Relative: 5 %
Lymphs Abs: 0.8 K/uL (ref 0.7–4.0)
MCH: 30.6 pg (ref 26.0–34.0)
MCHC: 31.4 g/dL (ref 30.0–36.0)
MCV: 97.3 fL (ref 80.0–100.0)
Monocytes Absolute: 1 K/uL (ref 0.1–1.0)
Monocytes Relative: 6 %
Neutro Abs: 14.5 K/uL — ABNORMAL HIGH (ref 1.7–7.7)
Neutrophils Relative %: 88 %
Platelets: 150 K/uL (ref 150–400)
RBC: 3.73 MIL/uL — ABNORMAL LOW (ref 4.22–5.81)
RDW: 14 % (ref 11.5–15.5)
WBC: 16.5 K/uL — ABNORMAL HIGH (ref 4.0–10.5)
nRBC: 0 % (ref 0.0–0.2)

## 2019-02-24 LAB — COMPREHENSIVE METABOLIC PANEL
ALT: 12 U/L (ref 0–44)
AST: 14 U/L — ABNORMAL LOW (ref 15–41)
Albumin: 3.7 g/dL (ref 3.5–5.0)
Alkaline Phosphatase: 47 U/L (ref 38–126)
Anion gap: 9 (ref 5–15)
BUN: 27 mg/dL — ABNORMAL HIGH (ref 8–23)
CO2: 25 mmol/L (ref 22–32)
Calcium: 8.5 mg/dL — ABNORMAL LOW (ref 8.9–10.3)
Chloride: 105 mmol/L (ref 98–111)
Creatinine, Ser: 1.33 mg/dL — ABNORMAL HIGH (ref 0.61–1.24)
GFR calc Af Amer: 56 mL/min — ABNORMAL LOW (ref >60)
GFR calc non Af Amer: 49 mL/min — ABNORMAL LOW (ref >60)
Glucose, Bld: 182 mg/dL — ABNORMAL HIGH (ref 70–99)
Potassium: 3.6 mmol/L (ref 3.5–5.1)
Sodium: 139 mmol/L (ref 135–145)
Total Bilirubin: 0.8 mg/dL (ref 0.3–1.2)
Total Protein: 5.8 g/dL — ABNORMAL LOW (ref 6.5–8.1)

## 2019-02-24 LAB — APTT: aPTT: 29 seconds (ref 24–36)

## 2019-02-24 LAB — PROTIME-INR
INR: 1.1 (ref 0.8–1.2)
Prothrombin Time: 14.1 s (ref 11.4–15.2)

## 2019-02-24 LAB — TROPONIN I (HIGH SENSITIVITY): Troponin I (High Sensitivity): 8 ng/L

## 2019-02-24 MED ORDER — ONDANSETRON HCL 4 MG/2ML IJ SOLN
4.0000 mg | Freq: Once | INTRAMUSCULAR | Status: AC
  Administered 2019-02-24: 4 mg via INTRAVENOUS
  Filled 2019-02-24: qty 2

## 2019-02-24 MED ORDER — IOHEXOL 300 MG/ML  SOLN
100.0000 mL | Freq: Once | INTRAMUSCULAR | Status: AC | PRN
  Administered 2019-02-24: 100 mL via INTRAVENOUS

## 2019-02-24 MED ORDER — MORPHINE SULFATE (PF) 4 MG/ML IV SOLN
4.0000 mg | Freq: Once | INTRAVENOUS | Status: AC
  Administered 2019-02-24: 4 mg via INTRAVENOUS
  Filled 2019-02-24: qty 1
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
## 2019-02-24 NOTE — ED Provider Notes (Addendum)
-----------------------------------------   11:56 PM on 02/24/2019 -----------------------------------------  CT head interpreted per Dr. Nevada Crane:  No acute traumatic injury identified. Normal for age non contrast  appearance of the brain.    CT cervical spine interpreted per Dr. Nevada Crane:  1. Osteopenia with questionable nondisplaced fracture of the left  lamina of C5, seen only on sagittal image 26. I favor this is  artifact, but if the patient has persistent neck pain then spine  surgery consultation would be recommended.  2. No other acute traumatic injury identified in the cervical spine.  3. Chronic appearing acquired C6-C7 ankylosis.    CT chest/abdomen/pelvis interpreted per Dr. Quintella Reichert:  1. Age indeterminate, likely acute, nondisplaced fracture of the  sternum. Correlation with clinical exam and point tenderness  recommended.  2. No acute/traumatic intrathoracic, abdominal, or pelvic pathology.  3. Cholelithiasis.  4. Colonic diverticulosis.  5. Hematoma in the subcutaneous soft tissues of the medial distal  left thigh.    CT left femur interpreted per Dr. Marguerita Merles:  1. Lobular, elongated, elongated collection along the medial right  lower thigh measuring up to 19 cm in craniocaudal dimension and 4.4  cm in maximal thickness with some internal heterogeneous hemorrhagic  material. Given size and location finding is concerning for a [employer] lesion.  2. Extensive soft tissue contusion and overlying skin thickening  along the medial thigh as well as the lateral left hip.  3. Prepatellar swelling and trace effusion. Trace suprapatellar  joint effusion as well.  4. No acute osseous abnormality.  5. Prior total left knee arthroplasty with patellar resurfacing.  6. Large bilateral hydroceles, right greater than left.  7. Aortic Atherosclerosis (ICD10-I70.0). Plaque is seen throughout  the included lower extremity arteries.  *Should read  LEFT*  ----------------------------------------- 12:06 AM on 02/25/2019 -----------------------------------------  Updated patient on all test results.  Room air saturations 87%; placed on 2 L nasal cannula oxygen which brings his oxygenation to 96%.  Patient accepted by Dr. Lucious Groves to [facility] ED.   ----------------------------------------- 12:54 AM on 02/25/2019 -----------------------------------------  EMS transport at bedside.  Patient transferred in stable condition.   Paulette Blanch, MD 02/25/19 0104    Paulette Blanch, MD 02/25/19 [phone]

## 2019-02-24 NOTE — ED Notes (Signed)
Pt denies any hx afib
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
## 2019-02-25 DIAGNOSIS — Z86010 Personal history of colonic polyps: Secondary | ICD-10-CM | POA: Diagnosis not present

## 2019-02-25 DIAGNOSIS — I712 Thoracic aortic aneurysm, without rupture: Secondary | ICD-10-CM | POA: Diagnosis not present

## 2019-02-25 DIAGNOSIS — I313 Pericardial effusion (noninflammatory): Secondary | ICD-10-CM | POA: Diagnosis not present

## 2019-02-25 DIAGNOSIS — S199XXA Unspecified injury of neck, initial encounter: Secondary | ICD-10-CM | POA: Diagnosis not present

## 2019-02-25 DIAGNOSIS — I491 Atrial premature depolarization: Secondary | ICD-10-CM | POA: Diagnosis not present

## 2019-02-25 DIAGNOSIS — S299XXA Unspecified injury of thorax, initial encounter: Secondary | ICD-10-CM | POA: Diagnosis not present

## 2019-02-25 DIAGNOSIS — S8002XA Contusion of left knee, initial encounter: Secondary | ICD-10-CM | POA: Diagnosis not present

## 2019-02-25 DIAGNOSIS — I4891 Unspecified atrial fibrillation: Secondary | ICD-10-CM | POA: Diagnosis not present

## 2019-02-25 DIAGNOSIS — S0990XA Unspecified injury of head, initial encounter: Secondary | ICD-10-CM | POA: Diagnosis not present

## 2019-02-25 DIAGNOSIS — E039 Hypothyroidism, unspecified: Secondary | ICD-10-CM | POA: Diagnosis not present

## 2019-02-25 DIAGNOSIS — S3992XA Unspecified injury of lower back, initial encounter: Secondary | ICD-10-CM | POA: Diagnosis not present

## 2019-02-25 DIAGNOSIS — N400 Enlarged prostate without lower urinary tract symptoms: Secondary | ICD-10-CM | POA: Diagnosis not present

## 2019-02-25 DIAGNOSIS — S9001XA Contusion of right ankle, initial encounter: Secondary | ICD-10-CM | POA: Diagnosis not present

## 2019-02-25 DIAGNOSIS — M47816 Spondylosis without myelopathy or radiculopathy, lumbar region: Secondary | ICD-10-CM | POA: Diagnosis not present

## 2019-02-25 DIAGNOSIS — E119 Type 2 diabetes mellitus without complications: Secondary | ICD-10-CM | POA: Diagnosis not present

## 2019-02-25 DIAGNOSIS — I700 Atherosclerosis of aorta: Secondary | ICD-10-CM | POA: Diagnosis not present

## 2019-02-25 DIAGNOSIS — K219 Gastro-esophageal reflux disease without esophagitis: Secondary | ICD-10-CM | POA: Diagnosis not present

## 2019-02-25 DIAGNOSIS — I499 Cardiac arrhythmia, unspecified: Secondary | ICD-10-CM | POA: Diagnosis not present

## 2019-02-25 DIAGNOSIS — S7012XA Contusion of left thigh, initial encounter: Secondary | ICD-10-CM | POA: Diagnosis not present

## 2019-02-25 DIAGNOSIS — I2510 Atherosclerotic heart disease of native coronary artery without angina pectoris: Secondary | ICD-10-CM | POA: Diagnosis not present

## 2019-02-25 DIAGNOSIS — J449 Chronic obstructive pulmonary disease, unspecified: Secondary | ICD-10-CM | POA: Diagnosis not present

## 2019-02-25 DIAGNOSIS — E785 Hyperlipidemia, unspecified: Secondary | ICD-10-CM | POA: Diagnosis not present

## 2019-02-25 DIAGNOSIS — M8588 Other specified disorders of bone density and structure, other site: Secondary | ICD-10-CM | POA: Diagnosis not present

## 2019-02-25 DIAGNOSIS — K8020 Calculus of gallbladder without cholecystitis without obstruction: Secondary | ICD-10-CM | POA: Diagnosis not present

## 2019-02-25 DIAGNOSIS — R4182 Altered mental status, unspecified: Secondary | ICD-10-CM | POA: Diagnosis not present

## 2019-02-25 DIAGNOSIS — Z20828 Contact with and (suspected) exposure to other viral communicable diseases: Secondary | ICD-10-CM | POA: Diagnosis not present

## 2019-02-25 DIAGNOSIS — S2020XA Contusion of thorax, unspecified, initial encounter: Secondary | ICD-10-CM | POA: Diagnosis not present

## 2019-02-25 DIAGNOSIS — R0902 Hypoxemia: Secondary | ICD-10-CM | POA: Diagnosis not present

## 2019-02-25 DIAGNOSIS — M47814 Spondylosis without myelopathy or radiculopathy, thoracic region: Secondary | ICD-10-CM | POA: Diagnosis not present

## 2019-02-25 DIAGNOSIS — Z87891 Personal history of nicotine dependence: Secondary | ICD-10-CM | POA: Diagnosis not present

## 2019-02-25 DIAGNOSIS — S301XXA Contusion of abdominal wall, initial encounter: Secondary | ICD-10-CM | POA: Diagnosis not present

## 2019-02-25 DIAGNOSIS — S2222XA Fracture of body of sternum, initial encounter for closed fracture: Secondary | ICD-10-CM | POA: Diagnosis not present

## 2019-02-25 DIAGNOSIS — I081 Rheumatic disorders of both mitral and tricuspid valves: Secondary | ICD-10-CM | POA: Diagnosis not present

## 2019-02-25 DIAGNOSIS — E860 Dehydration: Secondary | ICD-10-CM | POA: Diagnosis not present

## 2019-02-25 DIAGNOSIS — R9089 Other abnormal findings on diagnostic imaging of central nervous system: Secondary | ICD-10-CM | POA: Diagnosis not present

## 2019-02-25 DIAGNOSIS — N433 Hydrocele, unspecified: Secondary | ICD-10-CM | POA: Diagnosis not present

## 2019-02-25 DIAGNOSIS — S2220XA Unspecified fracture of sternum, initial encounter for closed fracture: Secondary | ICD-10-CM | POA: Diagnosis not present

## 2019-02-25 DIAGNOSIS — I10 Essential (primary) hypertension: Secondary | ICD-10-CM | POA: Diagnosis not present

## 2019-02-25 DIAGNOSIS — R918 Other nonspecific abnormal finding of lung field: Secondary | ICD-10-CM | POA: Diagnosis not present

## 2019-02-25 DIAGNOSIS — I77810 Thoracic aortic ectasia: Secondary | ICD-10-CM | POA: Diagnosis not present

## 2019-02-25 LAB — SARS CORONAVIRUS 2 BY RT PCR (HOSPITAL ORDER, PERFORMED IN ~~LOC~~ HOSPITAL LAB): SARS Coronavirus 2: NEGATIVE

## 2019-02-25 MED ORDER — MORPHINE SULFATE (PF) 2 MG/ML IV SOLN
INTRAVENOUS | Status: AC
  Filled 2019-02-25: qty 1

## 2019-02-25 MED ORDER — MORPHINE SULFATE (PF) 4 MG/ML IV SOLN
4.0000 mg | Freq: Once | INTRAVENOUS | Status: AC
  Administered 2019-02-25: 4 mg via INTRAVENOUS
  Filled 2019-02-25: qty 1

## 2019-02-25 NOTE — ED Notes (Signed)
[facility] ED report provided by this RN.

## 2019-02-26 DIAGNOSIS — I4891 Unspecified atrial fibrillation: Secondary | ICD-10-CM | POA: Diagnosis not present

## 2019-02-26 DIAGNOSIS — S20211A Contusion of right front wall of thorax, initial encounter: Secondary | ICD-10-CM | POA: Diagnosis not present

## 2019-02-26 MED ORDER — TOPIRAMATE 25 MG PO TABS
50.00 | ORAL_TABLET | ORAL | Status: DC
Start: 2019-02-26 — End: 2019-02-26

## 2019-02-26 MED ORDER — LIDOCAINE 5 % EX PTCH
2.00 | MEDICATED_PATCH | CUTANEOUS | Status: DC
Start: 2019-02-27 — End: 2019-02-26

## 2019-02-26 MED ORDER — POLYETHYLENE GLYCOL 3350 17 G PO PACK
17.00 | PACK | ORAL | Status: DC
Start: 2019-02-27 — End: 2019-02-26

## 2019-02-26 MED ORDER — ACETAMINOPHEN 500 MG PO TABS
1000.00 | ORAL_TABLET | ORAL | Status: DC
Start: 2019-02-26 — End: 2019-02-26

## 2019-02-26 MED ORDER — GENERIC EXTERNAL MEDICATION
Status: DC
Start: ? — End: 2019-02-26

## 2019-02-26 MED ORDER — HEPARIN SODIUM (PORCINE) 10000 UNIT/ML IJ SOLN
7500.00 | INTRAMUSCULAR | Status: DC
Start: 2019-02-26 — End: 2019-02-26

## 2019-02-26 MED ORDER — ATORVASTATIN CALCIUM 20 MG PO TABS
20.00 | ORAL_TABLET | ORAL | Status: DC
Start: 2019-02-27 — End: 2019-02-26

## 2019-02-26 MED ORDER — LEVOTHYROXINE SODIUM 75 MCG PO TABS
75.00 | ORAL_TABLET | ORAL | Status: DC
Start: 2019-02-27 — End: 2019-02-26

## 2019-02-26 MED ORDER — INSULIN REGULAR HUMAN 100 UNIT/ML IJ SOLN
0.00 | INTRAMUSCULAR | Status: DC
Start: 2019-02-26 — End: 2019-02-26

## 2019-02-26 MED ORDER — DEXTROSE 50 % IV SOLN
12.50 | INTRAVENOUS | Status: DC
Start: ? — End: 2019-02-26

## 2019-02-26 MED ORDER — PANTOPRAZOLE SODIUM 40 MG PO TBEC
40.00 | DELAYED_RELEASE_TABLET | ORAL | Status: DC
Start: 2019-02-27 — End: 2019-02-26

## 2019-02-26 MED ORDER — FUROSEMIDE 40 MG PO TABS
40.00 | ORAL_TABLET | ORAL | Status: DC
Start: 2019-02-27 — End: 2019-02-26

## 2019-02-26 MED ORDER — HYDROMORPHONE HCL 1 MG/ML IJ SOLN
0.50 | INTRAMUSCULAR | Status: DC
Start: ? — End: 2019-02-26

## 2019-02-26 MED ORDER — ONDANSETRON HCL 4 MG/2ML IJ SOLN
4.00 | INTRAMUSCULAR | Status: DC
Start: ? — End: 2019-02-26

## 2019-02-26 MED ORDER — DOCUSATE SODIUM 100 MG PO CAPS
100.00 | ORAL_CAPSULE | ORAL | Status: DC
Start: 2019-02-26 — End: 2019-02-26

## 2019-02-26 MED ORDER — TERAZOSIN HCL 2 MG PO CAPS
10.00 | ORAL_CAPSULE | ORAL | Status: DC
Start: 2019-02-26 — End: 2019-02-26

## 2019-03-01 ENCOUNTER — Encounter: Payer: Self-pay | Admitting: Family Medicine

## 2019-03-01 ENCOUNTER — Ambulatory Visit (INDEPENDENT_AMBULATORY_CARE_PROVIDER_SITE_OTHER): Payer: PPO | Admitting: Family Medicine

## 2019-03-01 ENCOUNTER — Other Ambulatory Visit: Payer: Self-pay

## 2019-03-01 VITALS — BP 104/60 | HR 94 | Temp 96.9°F | Wt 268.0 lb

## 2019-03-01 DIAGNOSIS — I4891 Unspecified atrial fibrillation: Secondary | ICD-10-CM

## 2019-03-01 DIAGNOSIS — I712 Thoracic aortic aneurysm, without rupture, unspecified: Secondary | ICD-10-CM

## 2019-03-01 DIAGNOSIS — T7840XA Allergy, unspecified, initial encounter: Secondary | ICD-10-CM | POA: Diagnosis not present
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
## 2019-03-04 ENCOUNTER — Telehealth: Payer: Self-pay

## 2019-03-04 NOTE — Telephone Encounter (Signed)
Heating pad

## 2019-03-04 NOTE — Telephone Encounter (Signed)
Also may be an Ace wrap as a compression garment.
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
## 2019-03-04 NOTE — Telephone Encounter (Signed)
Patient notified

## 2019-03-08 ENCOUNTER — Telehealth: Payer: Self-pay | Admitting: Family Medicine

## 2019-03-08 ENCOUNTER — Encounter: Payer: Self-pay | Admitting: Family Medicine

## 2019-03-08 ENCOUNTER — Other Ambulatory Visit: Payer: Self-pay

## 2019-03-08 ENCOUNTER — Ambulatory Visit (INDEPENDENT_AMBULATORY_CARE_PROVIDER_SITE_OTHER): Payer: PPO | Admitting: Family Medicine

## 2019-03-08 DIAGNOSIS — S7012XD Contusion of left thigh, subsequent encounter: Secondary | ICD-10-CM | POA: Diagnosis not present

## 2019-03-08 DIAGNOSIS — M79605 Pain in left leg: Secondary | ICD-10-CM

## 2019-03-08 MED ORDER — TRAMADOL HCL 50 MG PO TABS: 50.0000 mg | ORAL_TABLET | Freq: Four times a day (QID) | ORAL | 1 refills | Status: DC | PRN

## 2019-03-08 NOTE — Telephone Encounter (Signed)
Christy from pharmacy is needing the diagnosis code to fill pt's traMADol (ULTRAM) 50 MG tablet.  Please call [place] back at [phone].   Thanks, [employer]
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
## 2019-03-09 NOTE — Telephone Encounter (Signed)
No diagnosis code needed. Medication has already been picked up by patient.

## 2019-03-12 ENCOUNTER — Telehealth: Payer: Self-pay | Admitting: Family Medicine

## 2019-03-12 MED ORDER — HYDROCODONE-ACETAMINOPHEN 7.5-325 MG PO TABS: 1.0000 | ORAL_TABLET | Freq: Four times a day (QID) | ORAL | 0 refills | Status: AC | PRN

## 2019-03-12 NOTE — Telephone Encounter (Signed)
Patient of Dr. Gilberts, please review.KW 
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
## 2019-03-12 NOTE — Telephone Encounter (Signed)
Have sent prescription for hydrocodone/apap, go to ER if pain is getting worse, or any fever or any signs of infectoin in leg.

## 2019-03-12 NOTE — Telephone Encounter (Signed)
Patient called in wanting to know the status of his request.

## 2019-03-15 ENCOUNTER — Ambulatory Visit (INDEPENDENT_AMBULATORY_CARE_PROVIDER_SITE_OTHER): Payer: PPO | Admitting: Family Medicine

## 2019-03-15 ENCOUNTER — Other Ambulatory Visit: Payer: Self-pay

## 2019-03-15 ENCOUNTER — Encounter: Payer: Self-pay | Admitting: Family Medicine

## 2019-03-15 DIAGNOSIS — I872 Venous insufficiency (chronic) (peripheral): Secondary | ICD-10-CM | POA: Diagnosis not present

## 2019-03-15 DIAGNOSIS — S7012XD Contusion of left thigh, subsequent encounter: Secondary | ICD-10-CM | POA: Diagnosis not present

## 2019-03-15 DIAGNOSIS — R600 Localized edema: Secondary | ICD-10-CM | POA: Diagnosis not present

## 2019-03-15 DIAGNOSIS — E119 Type 2 diabetes mellitus without complications: Secondary | ICD-10-CM | POA: Diagnosis not present

## 2019-03-15 DIAGNOSIS — L03119 Cellulitis of unspecified part of limb: Secondary | ICD-10-CM

## 2019-03-15 MED ORDER — CEPHALEXIN 500 MG PO CAPS: 500.0000 mg | ORAL_CAPSULE | Freq: Three times a day (TID) | ORAL | 0 refills | Status: DC

## 2019-03-15 NOTE — Telephone Encounter (Signed)
Spoke with patient on the phone who states that he did pick up medication over weekend and pain has not improved. Patient states that he has a appt scheduled this afternoon with Dr. Rosanna Randy to follow up. KW
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
## 2019-03-16 ENCOUNTER — Telehealth: Payer: Self-pay | Admitting: Family Medicine

## 2019-03-16 DIAGNOSIS — R600 Localized edema: Secondary | ICD-10-CM

## 2019-03-16 NOTE — Telephone Encounter (Signed)
Per [facility] scheduling department order for ultrasound will need to be changed to US venous lower bilateral,Thanks

## 2019-03-17 ENCOUNTER — Telehealth: Payer: Self-pay | Admitting: Family Medicine

## 2019-03-17 ENCOUNTER — Telehealth: Payer: Self-pay | Admitting: Cardiology

## 2019-03-17 NOTE — Telephone Encounter (Signed)
Order updated. Thanks

## 2019-03-17 NOTE — Telephone Encounter (Signed)
Please advise 

## 2019-03-17 NOTE — Telephone Encounter (Signed)
Pt was in Monday to see Dr. Rosanna Randy and was told that he would be putting an order in for an [place] of his leg and he has not heard anything back from [place] or radiology as to an appt.  CB#  [phone]  [facility]

## 2019-03-17 NOTE — Progress Notes (Signed)
Patient: Jared Rice Male    DOB: 05-11-1933   85 y.o.   MRN: [id number] Visit Date: 03/18/2019  Today's Provider: Wilhemena Durie, MD   Chief Complaint  Patient presents with  . Follow-up   Subjective:   HPI   Edema of both legs  From 03/15/2019-ordered US Venous Bilateral. Patient will have this done on 10/28. Patient reports that he is also having severe pain. He describes it as a sunburn sensation.  Patient reports that he was seen by cardiology today, and he was prescribed Eliquis 5mg  BID.    Allergies  Allergen Reactions  . Dexilant [Dexlansoprazole] Rash  . Primidone Itching and Rash    rash     Current Outpatient Medications:  .  apixaban (ELIQUIS) 5 MG TABS tablet, Take 1 tablet (5 mg total) by mouth 2 (two) times daily., Disp: 60 tablet, Rfl: 11 .  cephALEXin (KEFLEX) 500 MG capsule, Take 500 mg by mouth 2 (two) times daily., Disp: , Rfl:  .  cephALEXin (KEFLEX) 500 MG capsule, Take 1 capsule (500 mg total) by mouth 3 (three) times daily., Disp: 20 capsule, Rfl: 0 .  diphenhydramine-acetaminophen (TYLENOL PM) 25-500 MG TABS, Take 2 tablets by mouth at bedtime as needed., Disp: , Rfl:  .  doxazosin (CARDURA) 8 MG tablet, Take 1 tablet (8 mg total) by mouth daily., Disp: 90 tablet, Rfl: 3 .  furosemide (LASIX) 20 MG tablet, Take 2 tablets by mouth daily, Disp: 180 tablet, Rfl: 2 .  furosemide (LASIX) 40 MG tablet, Take 1 tablet (40 mg total) by mouth daily., Disp: 30 tablet, Rfl: 11 .  Iodoquinol-HC-Aloe Polysacch (ALCORTIN A) 1-2-1 % GEL, as needed. , Disp: , Rfl: 3 .  levothyroxine (SYNTHROID) 75 MCG tablet, Take 1 tablet by mouth daily in the morning, Disp: 90 tablet, Rfl: 2 .  pantoprazole (PROTONIX) 40 MG tablet, Take 1 tablet by mouth once daily, Disp: 90 tablet, Rfl: 1 .  quinapril (ACCUPRIL) 20 MG tablet, Take 1 tablet (20 mg total) by mouth daily., Disp: 90 tablet, Rfl: 3 .  rosuvastatin (CRESTOR) 20 MG tablet, Take 1 tablet (20 mg total) by  mouth daily., Disp: 90 tablet, Rfl: 3 .  sulfamethoxazole-trimethoprim (BACTRIM DS) 800-160 MG tablet, Take 1 tablet by mouth 2 (two) times daily., Disp: , Rfl:  .  topiramate (TOPAMAX) 50 MG tablet, Take 1 tablet by mouth twice daily, Disp: 60 tablet, Rfl: 5 .  traMADol (ULTRAM) 50 MG tablet, Take 1 tablet (50 mg total) by mouth every 6 (six) hours as needed., Disp: 60 tablet, Rfl: 1  Review of Systems  Constitutional: Negative for appetite change, chills and fever.  HENT: Negative.   Eyes: Negative.   Respiratory: Negative for chest tightness, shortness of breath and wheezing.   Cardiovascular: Negative for chest pain and palpitations.  Gastrointestinal: Negative for abdominal pain, nausea and vomiting.  Allergic/Immunologic: Negative.   Hematological: Negative.   Psychiatric/Behavioral: Negative.     Social History   Tobacco Use  . Smoking status: Former Smoker    Packs/day: 2.00    Years: 31.00    Pack years: 62.00    Types: Cigarettes    Quit date: 12/24/1980    Years since quitting: 38.2  . Smokeless tobacco: Never Used  . Tobacco comment: quit in 1982  Substance Use Topics  . Alcohol use: No    Alcohol/week: 0.0 standard drinks      Objective:   BP 122/74   Pulse  82   Resp 20   Ht 6\' 1"  (1.854 m)   SpO2 96%   BMI 35.89 kg/m  Vitals:   03/18/19 1341  BP: 122/74  Pulse: 82  Resp: 20  SpO2: 96%  Height: 6\' 1"  (1.854 m)  Body mass index is 35.89 kg/m.   Physical Exam Vitals signs reviewed.  Constitutional:      Appearance: He is obese.  HENT:     Head: Normocephalic and atraumatic.     Right Ear: External ear normal.     Left Ear: External ear normal.  Eyes:     General: No scleral icterus. Cardiovascular:     Rate and Rhythm: Normal rate and regular rhythm.     Heart sounds: Normal heart sounds.     Comments: No gallops or rubs noted. Pulmonary:     Effort: Pulmonary effort is normal.     Breath sounds: Normal breath sounds.  Abdominal:      Palpations: Abdomen is soft.  Musculoskeletal:     Right lower leg: Edema present.     Left lower leg: Edema present.     Comments: 1+ LE edema  Skin:    General: Skin is warm and dry.     Comments: There is significant bruising of the left leg above the knee and medially.  There is some moderate amount of bruising of the right lower leg.  He is able to ambulate. Left lower leg is erythematous in lateral calf and tender to touch.  Neurological:     General: No focal deficit present.     Mental Status: He is alert and oriented to person, place, and time.  Psychiatric:        Mood and Affect: Mood normal.        Behavior: Behavior normal.        Thought Content: Thought content normal.        Judgment: Judgment normal.      No results found for any visits on 03/18/19.     Assessment & Plan    1. Cellulitis of lower extremity, unspecified laterality Cellulitis of left lower leg being treated with antibiotics.  2. Leg pain, anterior, left Due to ongoing swelling that is not much worse than normal obtain Dopplers although the patient is now on Eliquis so being treated if he does have the DVT.  Eliquis is for atrial fibrillation which is new  3. Edema of both legs Dopplers as above.  4. Persistent atrial fibrillation (HCC) On Eliquis per cardiology   Wilhemena Durie, MD  [facility]
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
## 2019-03-17 NOTE — Telephone Encounter (Signed)
°  Ultrasound Oct. 28th  Pain in legs - skin on legs feels like sunburned - wants nothing touching it.   HYDROcodone-acetaminophen (NORCO) 7.5-325 MG tablet Pt has taken 9 of them and has only 3 left. Wants to know if more can be prescribed since he is still in pain. Will talk with Dr. Rosanna Randy on tomorrow's visit.  Pt uses: [facility] [address], [place] - [place] [id number] (Phone) [phone] (Fax)   Thanks, [employer]

## 2019-03-17 NOTE — Telephone Encounter (Signed)
New Message:     Ptsaid his primary doctor said he needs to see Dr Martinique asap please.

## 2019-03-18 ENCOUNTER — Ambulatory Visit (INDEPENDENT_AMBULATORY_CARE_PROVIDER_SITE_OTHER): Payer: PPO | Admitting: Family Medicine

## 2019-03-18 ENCOUNTER — Encounter: Payer: Self-pay | Admitting: Cardiology

## 2019-03-18 ENCOUNTER — Ambulatory Visit (INDEPENDENT_AMBULATORY_CARE_PROVIDER_SITE_OTHER): Payer: PPO | Admitting: Cardiology

## 2019-03-18 ENCOUNTER — Other Ambulatory Visit: Payer: Self-pay

## 2019-03-18 VITALS — BP 128/80 | HR 88 | Temp 97.5°F | Ht 73.0 in | Wt 272.0 lb

## 2019-03-18 VITALS — BP 122/74 | HR 82 | Resp 20 | Ht 73.0 in

## 2019-03-18 DIAGNOSIS — I4819 Other persistent atrial fibrillation: Secondary | ICD-10-CM | POA: Diagnosis not present

## 2019-03-18 DIAGNOSIS — M79605 Pain in left leg: Secondary | ICD-10-CM | POA: Diagnosis not present

## 2019-03-18 DIAGNOSIS — I10 Essential (primary) hypertension: Secondary | ICD-10-CM

## 2019-03-18 DIAGNOSIS — R600 Localized edema: Secondary | ICD-10-CM

## 2019-03-18 DIAGNOSIS — I712 Thoracic aortic aneurysm, without rupture, unspecified: Secondary | ICD-10-CM

## 2019-03-18 DIAGNOSIS — I447 Left bundle-branch block, unspecified: Secondary | ICD-10-CM | POA: Diagnosis not present

## 2019-03-18 DIAGNOSIS — L03119 Cellulitis of unspecified part of limb: Secondary | ICD-10-CM

## 2019-03-18 MED ORDER — OXYCODONE HCL 5 MG PO TABS: 5.0000 mg | ORAL_TABLET | ORAL | 0 refills | Status: DC | PRN

## 2019-03-18 MED ORDER — APIXABAN 5 MG PO TABS: 5.0000 mg | ORAL_TABLET | Freq: Two times a day (BID) | ORAL | 11 refills | Status: DC

## 2019-03-18 NOTE — Patient Instructions (Signed)
Start Eliquis 5 mg twice a day  Stop taking ASA and avoid NSAIDs like Ibuprofen.  We will follow up in 4 weeks

## 2019-03-18 NOTE — Telephone Encounter (Signed)
Please advise? Patient has a follow up appt to at 1:40 pm.
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
## 2019-03-24 ENCOUNTER — Ambulatory Visit
Admission: RE | Admit: 2019-03-24 | Discharge: 2019-03-24 | Disposition: A | Discharge status: Home / Self Care | Payer: PPO | Source: Ambulatory Visit | Attending: Family Medicine | Admitting: Family Medicine

## 2019-03-24 ENCOUNTER — Other Ambulatory Visit: Payer: Self-pay

## 2019-03-24 DIAGNOSIS — R600 Localized edema: Secondary | ICD-10-CM | POA: Diagnosis not present
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
## 2019-04-08 ENCOUNTER — Ambulatory Visit (INDEPENDENT_AMBULATORY_CARE_PROVIDER_SITE_OTHER): Payer: PPO | Admitting: Family Medicine

## 2019-04-08 ENCOUNTER — Encounter: Payer: Self-pay | Admitting: Family Medicine

## 2019-04-08 ENCOUNTER — Other Ambulatory Visit: Payer: Self-pay

## 2019-04-08 VITALS — BP 107/72 | HR 72 | Temp 97.2°F | Resp 18 | Ht 73.0 in | Wt 272.0 lb

## 2019-04-08 DIAGNOSIS — S7012XD Contusion of left thigh, subsequent encounter: Secondary | ICD-10-CM

## 2019-04-08 DIAGNOSIS — L03119 Cellulitis of unspecified part of limb: Secondary | ICD-10-CM

## 2019-04-08 DIAGNOSIS — R600 Localized edema: Secondary | ICD-10-CM

## 2019-04-08 DIAGNOSIS — I4819 Other persistent atrial fibrillation: Secondary | ICD-10-CM | POA: Diagnosis not present
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
## 2019-04-19 ENCOUNTER — Other Ambulatory Visit: Payer: Self-pay

## 2019-04-21 ENCOUNTER — Ambulatory Visit (INDEPENDENT_AMBULATORY_CARE_PROVIDER_SITE_OTHER): Payer: PPO | Admitting: Cardiology

## 2019-04-21 ENCOUNTER — Other Ambulatory Visit: Payer: Self-pay

## 2019-04-21 ENCOUNTER — Encounter: Payer: Self-pay | Admitting: Cardiology

## 2019-04-21 VITALS — BP 139/77 | HR 84 | Ht 73.0 in | Wt 275.0 lb

## 2019-04-21 DIAGNOSIS — I712 Thoracic aortic aneurysm, without rupture, unspecified: Secondary | ICD-10-CM

## 2019-04-21 DIAGNOSIS — I10 Essential (primary) hypertension: Secondary | ICD-10-CM

## 2019-04-21 DIAGNOSIS — I447 Left bundle-branch block, unspecified: Secondary | ICD-10-CM

## 2019-04-21 DIAGNOSIS — I4819 Other persistent atrial fibrillation: Secondary | ICD-10-CM

## 2019-04-21 MED ORDER — APIXABAN 5 MG PO TABS: 5.0000 mg | ORAL_TABLET | Freq: Two times a day (BID) | ORAL | 3 refills | Status: DC

## 2019-04-27 DIAGNOSIS — E119 Type 2 diabetes mellitus without complications: Secondary | ICD-10-CM | POA: Diagnosis not present

## 2019-04-27 LAB — HM DIABETES EYE EXAM

## 2019-04-28 DIAGNOSIS — B351 Tinea unguium: Secondary | ICD-10-CM | POA: Diagnosis not present

## 2019-04-28 DIAGNOSIS — M79674 Pain in right toe(s): Secondary | ICD-10-CM | POA: Diagnosis not present

## 2019-04-28 DIAGNOSIS — M79675 Pain in left toe(s): Secondary | ICD-10-CM | POA: Diagnosis not present
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
## 2019-05-03 ENCOUNTER — Ambulatory Visit: Payer: PPO | Admitting: Cardiology

## 2019-05-04 ENCOUNTER — Ambulatory Visit: Payer: Self-pay | Admitting: Family Medicine

## 2019-05-06 ENCOUNTER — Telehealth: Payer: Self-pay | Admitting: Family Medicine

## 2019-05-06 DIAGNOSIS — S81802D Unspecified open wound, left lower leg, subsequent encounter: Secondary | ICD-10-CM

## 2019-05-06 NOTE — Telephone Encounter (Signed)
From [facility] 
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
## 2019-05-06 NOTE — Telephone Encounter (Signed)
Please advise 

## 2019-05-10 DIAGNOSIS — M1711 Unilateral primary osteoarthritis, right knee: Secondary | ICD-10-CM | POA: Diagnosis not present

## 2019-05-10 DIAGNOSIS — M7581 Other shoulder lesions, right shoulder: Secondary | ICD-10-CM | POA: Diagnosis not present

## 2019-05-10 DIAGNOSIS — S81801A Unspecified open wound, right lower leg, initial encounter: Secondary | ICD-10-CM | POA: Diagnosis not present

## 2019-05-10 DIAGNOSIS — I878 Other specified disorders of veins: Secondary | ICD-10-CM | POA: Diagnosis not present

## 2019-05-11 ENCOUNTER — Telehealth: Payer: Self-pay | Admitting: Family Medicine

## 2019-05-11 MED ORDER — TOPIRAMATE 50 MG PO TABS: 50.0000 mg | ORAL_TABLET | Freq: Two times a day (BID) | ORAL | 5 refills | Status: DC

## 2019-05-11 NOTE — Telephone Encounter (Signed)
Ok to order 

## 2019-05-11 NOTE — Telephone Encounter (Signed)
Patient requesting 90 day supply topiramate (TOPAMAX) 50 MG tablet , informed please allow 48 to 72 hour turn around time    [employer] order phone # [phone] better known as [facility] - [place], [place]

## 2019-05-11 NOTE — Telephone Encounter (Signed)
From [facility] 
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
## 2019-05-12 NOTE — Telephone Encounter (Signed)
Alayna pointed out that home health probably would not do wound care but that we can send him to the wound care center right behind [place].

## 2019-05-12 NOTE — Telephone Encounter (Signed)
A referral to the wound care center has been placed and the patient gave verbal understanding.

## 2019-05-13 ENCOUNTER — Encounter: Payer: Self-pay | Admitting: Adult Health

## 2019-05-13 ENCOUNTER — Ambulatory Visit: Payer: Self-pay | Admitting: *Deleted

## 2019-05-13 ENCOUNTER — Other Ambulatory Visit: Payer: Self-pay

## 2019-05-13 ENCOUNTER — Ambulatory Visit (INDEPENDENT_AMBULATORY_CARE_PROVIDER_SITE_OTHER): Payer: PPO | Admitting: Adult Health

## 2019-05-13 VITALS — BP 122/74 | HR 65 | Temp 97.1°F | Resp 16 | Wt 276.0 lb

## 2019-05-13 DIAGNOSIS — L0390 Cellulitis, unspecified: Secondary | ICD-10-CM

## 2019-05-13 DIAGNOSIS — I872 Venous insufficiency (chronic) (peripheral): Secondary | ICD-10-CM

## 2019-05-13 MED ORDER — SULFAMETHOXAZOLE-TRIMETHOPRIM 800-160 MG PO TABS: 1.0000 | ORAL_TABLET | Freq: Two times a day (BID) | ORAL | 0 refills | Status: DC

## 2019-05-13 NOTE — Telephone Encounter (Signed)
Patient scheduled for appt with Laverna Peace, NP for today at 2:00 pm.

## 2019-05-13 NOTE — Telephone Encounter (Signed)
FYI

## 2019-05-13 NOTE — Telephone Encounter (Signed)
We might need to see him this afternoon.  Let me know.
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
## 2019-05-13 NOTE — Patient Instructions (Signed)
Sulfamethoxazole; Trimethoprim, SMX-TMP tablets What is this medicine? SULFAMETHOXAZOLE; TRIMETHOPRIM or SMX-TMP (suhl fuh meth OK suh zohl; trye METH oh prim) is a combination of a sulfonamide antibiotic and a second antibiotic, trimethoprim. It is used to treat or prevent certain kinds of bacterial infections. It will not work for colds, flu, or other viral infections. This medicine may be used for other purposes; ask your health care provider or pharmacist if you have questions. COMMON BRAND NAME(S): Bacter-Aid DS, Bactrim, Bactrim DS, Septra, Septra DS What should I tell my health care provider before I take this medicine? They need to know if you have any of these conditions:  anemia  asthma  being treated with anticonvulsants  if you frequently drink alcohol containing drinks  kidney disease  liver disease  low level of folic acid or VELFYBO-1-BPZWCHENI dehydrogenase  poor nutrition or malabsorption  porphyria  severe allergies  thyroid disorder  an unusual or allergic reaction to sulfamethoxazole, trimethoprim, sulfa drugs, other medicines, foods, dyes, or preservatives  pregnant or trying to get pregnant  breast-feeding How should I use this medicine? Take this medicine by mouth with a full glass of water. Follow the directions on the prescription label. Take your medicine at regular intervals. Do not take it more often than directed. Do not skip doses or stop your medicine early. Talk to your pediatrician regarding the use of this medicine in children. Special care may be needed. This medicine has been used in children as young as 92 months of age. Overdosage: If you think you have taken too much of this medicine contact a poison control center or emergency room at once. NOTE: This medicine is only for you. Do not share this medicine with others. What if I miss a dose? If you miss a dose, take it as soon as you can. If it is almost time for your next dose, take only  that dose. Do not take double or extra doses. What may interact with this medicine? Do not take this medicine with any of the following medications:  aminobenzoate potassium  dofetilide  metronidazole This medicine may also interact with the following medications:  ACE inhibitors like benazepril, enalapril, lisinopril, and ramipril  birth control pills  cyclosporine  digoxin  diuretics  indomethacin  medicines for diabetes  methenamine  methotrexate  phenytoin  potassium supplements  pyrimethamine  sulfinpyrazone  tricyclic antidepressants  warfarin This list may not describe all possible interactions. Give your health care provider a list of all the medicines, herbs, non-prescription drugs, or dietary supplements you use. Also tell them if you smoke, drink alcohol, or use illegal drugs. Some items may interact with your medicine. What should I watch for while using this medicine? Tell your doctor or health care professional if your symptoms do not improve. Drink several glasses of water a day to reduce the risk of kidney problems. Do not treat diarrhea with over the counter products. Contact your doctor if you have diarrhea that lasts more than 2 days or if it is severe and watery. This medicine can make you more sensitive to the sun. Keep out of the sun. If you cannot avoid being in the sun, wear protective clothing and use a sunscreen. Do not use sun lamps or tanning beds/booths. What side effects may I notice from receiving this medicine? Side effects that you should report to your doctor or health care professional as soon as possible:  allergic reactions like skin rash or hives, swelling of the face, lips,  or tongue  breathing problems  fever or chills, sore throat  irregular heartbeat, chest pain  joint or muscle pain  pain or difficulty passing urine  red pinpoint spots on skin  redness, blistering, peeling or loosening of the skin, including  inside the mouth  unusual bleeding or bruising  unusually weak or tired  yellowing of the eyes or skin Side effects that usually do not require medical attention (report to your doctor or health care professional if they continue or are bothersome):  diarrhea  dizziness  headache  loss of appetite  nausea, vomiting  nervousness This list may not describe all possible side effects. Call your doctor for medical advice about side effects. You may report side effects to FDA at 1-800-FDA-1088. Where should I keep my medicine? Keep out of the reach of children. Store at room temperature between 20 to 25 degrees C (68 to 77 degrees F). Protect from light. Throw away any unused medicine after the expiration date. NOTE: This sheet is a summary. It may not cover all possible information. If you have questions about this medicine, talk to your doctor, pharmacist, or health care provider.  2020 Elsevier/Gold Standard (2012-12-18 14:38:26)   Cellulitis, Adult  Cellulitis is a skin infection. The infected area is often warm, red, swollen, and sore. It occurs most often in the arms and lower legs. It is very important to get treated for this condition. What are the causes? This condition is caused by bacteria. The bacteria enter through a break in the skin, such as a cut, burn, insect bite, open sore, or crack. What increases the risk? This condition is more likely to occur in people who:  Have a weak body defense system (immune system).  Have open cuts, burns, bites, or scrapes on the skin.  Are older than 83 years of age.  Have a blood sugar problem (diabetes).  Have a long-lasting (chronic) liver disease (cirrhosis) or kidney disease.  Are very overweight (obese).  Have a skin problem, such as: ? Itchy rash (eczema). ? Slow movement of blood in the veins (venous stasis). ? Fluid buildup below the skin (edema).  Have been treated with high-energy rays (radiation).  Use IV  drugs. What are the signs or symptoms? Symptoms of this condition include:  Skin that is: ? Red. ? Streaking. ? Spotting. ? Swollen. ? Sore or painful when you touch it. ? Warm.  A fever.  Chills.  Blisters. How is this diagnosed? This condition is diagnosed based on:  Medical history.  Physical exam.  Blood tests.  Imaging tests. How is this treated? Treatment for this condition may include:  Medicines to treat infections or allergies.  Home care, such as: ? Rest. ? Placing cold or warm cloths (compresses) on the skin.  Hospital care, if the condition is very bad. Follow these instructions at home: Medicines  Take over-the-counter and prescription medicines only as told by your doctor.  If you were prescribed an antibiotic medicine, take it as told by your doctor. Do not stop taking it even if you start to feel better. General instructions   Drink enough fluid to keep your pee (urine) pale yellow.  Do not touch or rub the infected area.  Raise (elevate) the infected area above the level of your heart while you are sitting or lying down.  Place cold or warm cloths on the area as told by your doctor.  Keep all follow-up visits as told by your doctor. This is important. Contact  a doctor if:  You have a fever.  You do not start to get better after 1-2 days of treatment.  Your bone or joint under the infected area starts to hurt after the skin has healed.  Your infection comes back. This can happen in the same area or another area.  You have a swollen bump in the area.  You have new symptoms.  You feel ill and have muscle aches and pains. Get help right away if:  Your symptoms get worse.  You feel very sleepy.  You throw up (vomit) or have watery poop (diarrhea) for a long time.  You see red streaks coming from the area.  Your red area gets larger.  Your red area turns dark in color. These symptoms may represent a serious problem that is  an emergency. Do not wait to see if the symptoms will go away. Get medical help right away. Call your local emergency services (911 in the [place] Do not drive yourself to the hospital. Summary  Cellulitis is a skin infection. The area is often warm, red, swollen, and sore.  This condition is treated with medicines, rest, and cold and warm cloths.  Take all medicines only as told by your doctor.  Tell your doctor if symptoms do not start to get better after 1-2 days of treatment. This information is not intended to replace advice given to you by your health care provider. Make sure you discuss any questions you have with your health care provider. Document Released: 10/30/2007 Document Revised: 10/02/2017 Document Reviewed: 10/02/2017 Elsevier Patient Education  2020 [employer].
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
## 2019-05-18 NOTE — Progress Notes (Signed)
Subjective:   Jared Rice is a 85 y.o. male who presents for Medicare Annual/Subsequent preventive examination.    This visit is being conducted through telemedicine due to the COVID-19 pandemic. This patient has given me verbal consent via doximity to conduct this visit, patient states they are participating from their home address. Some vital signs may be absent or patient reported.    Patient identification: identified by name, DOB, and current address  Review of Systems:  N/A  Cardiac Risk Factors include: advanced age (>66men, >81 women);dyslipidemia;hypertension;male gender     Objective:    Vitals: There were no vitals taken for this visit.  There is no height or weight on file to calculate BMI. Unable to obtain vitals due to visit being conducted via telephonically.   Advanced Directives 05/24/2019 02/24/2019 05/18/2018 05/15/2017 07/16/2016 06/22/2015 02/22/2015  Does Patient Have a Medical Advance Directive? Yes Yes Yes Yes Yes Yes Yes  Type of Paramedic of [place];Living will [place];Living will [place];Living will [place];Living will Living will Living will;Healthcare Power of [place];Living will  Does patient want to make changes to medical advance directive? - No - Patient declined - - No - Patient declined - -  Copy of [place] in Chart? Yes - validated most recent copy scanned in chart (See row information) No - copy requested Yes - validated most recent copy scanned in chart (See row information) No - copy requested - - No - copy requested  Would patient like information on creating a medical advance directive? - No - Patient declined - - - - -    Tobacco Social History   Tobacco Use  Smoking Status Former Smoker  . Packs/day: 2.00  . Years: 31.00  . Pack years: 62.00  . Types: Cigarettes  . Quit date: 12/24/1980  . Years  since quitting: 38.4  Smokeless Tobacco Never Used  Tobacco Comment   quit in 1982     Counseling given: Not Answered Comment: quit in 1982   Clinical Intake:  Pre-visit preparation completed: Yes  Pain : No/denies pain Pain Score: 0-No pain     Nutritional Risks: None Diabetes: No  How often do you need to have someone help you when you read instructions, pamphlets, or other written materials from your doctor or pharmacy?: 1 - Never    Interpreter Needed?: No  Information entered by :: [facility], LPN  Past Medical History:  Diagnosis Date  . Anginal pain ([place])   . Chronic airway obstruction, not elsewhere classified   . Colon polyps   . Coronary atherosclerosis of native coronary artery    nonobstructive  . Diabetes mellitus without complication ([place])   . Essential hypertension, benign   . Glucose intolerance (impaired glucose tolerance)   . Hypothyroidism   . Iron deficiency anemia   . Knee pain, right   . Occlusion and stenosis of carotid artery without mention of cerebral infarction    wears compression stockings  . Other dyspnea and respiratory abnormality    w/ pseuodowheeze resolves with purse lip manuever  . Precordial pain   . Shoulder pain, right   . Tremor   . Unspecified sleep apnea   . Venous insufficiency   . Wears dentures    full upper   Past Surgical History:  Procedure Laterality Date  . CARDIAC CATHETERIZATION    . COLONOSCOPY  03/17/12   Dr Byrnett-diverticulosis  .  ESOPHAGOGASTRODUODENOSCOPY (EGD) WITH PROPOFOL N/A 11/25/2014   Procedure: ESOPHAGOGASTRODUODENOSCOPY (EGD) WITH PROPOFOL;  Surgeon: Lucilla Lame, MD;  Location: [place];  Service: Endoscopy;  Laterality: N/A;  with biopsy  . ESOPHAGOGASTRODUODENOSCOPY (EGD) WITH PROPOFOL N/A 01/05/2015   Procedure: ESOPHAGOGASTRODUODENOSCOPY (EGD) WITH PROPOFOL;  Surgeon: Lucilla Lame, MD;  Location: [place];  Service: Endoscopy;  Laterality: N/A;  . EYE SURGERY  Bilateral    cataract   . TOTAL KNEE ARTHROPLASTY Left    Family History  Problem Relation Age of Onset  . Heart failure Mother 6       congestive  . Heart attack Father 8  . Alcohol abuse Father   . Alzheimer's disease Brother 23  . Alzheimer's disease Sister 67  . Colon cancer Neg Hx   . Liver disease Neg Hx    Social History   Socioeconomic History  . Marital status: Married    Spouse name: Not on file  . Number of children: 2  . Years of education: Not on file  . Highest education level: Bachelor's degree (e.g., BA, AB, BS)  Occupational History  . Occupation: retired    Comment:  [employer]  . Smoking status: Former Smoker    Packs/day: 2.00    Years: 31.00    Pack years: 62.00    Types: Cigarettes    Quit date: 12/24/1980    Years since quitting: 38.4  . Smokeless tobacco: Never Used  . Tobacco comment: quit in 1982  Substance and Sexual Activity  . Alcohol use: No    Alcohol/week: 0.0 standard drinks  . Drug use: No  . Sexual activity: Not on file  Other Topics Concern  . Not on file  Social History Narrative   Lives with wife, retired Engineer, drilling, 2 children, 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   Social Determinants of Health   Financial Resource Strain: [place]   . Difficulty of Paying Living Expenses: Not hard at all  Food Insecurity: No Food Insecurity  . Worried About Charity fundraiser in the Last Year: Never true  . Ran Out of Food in the Last Year: Never true  Transportation Needs: No Transportation Needs  . Lack of Transportation (Medical): No  . Lack of Transportation (Non-Medical): No  Physical Activity: Inactive  . Days of Exercise per Week: 0 days  . Minutes of Exercise per Session: 0 min  Stress: No Stress Concern Present  . Feeling of Stress : Not at all  Social Connections: Not Isolated  . Frequency of Communication with Friends and Family: More than three times a week  . Frequency of Social Gatherings  with Friends and Family: Once a week  . Attends Religious Services: More than 4 times per year  . Active Member of Clubs or Organizations: Yes  . Attends Archivist Meetings: More than 4 times per year  . Marital Status: Married    Outpatient Encounter Medications as of 05/24/2019  Medication Sig  . apixaban (ELIQUIS) 5 MG TABS tablet Take 1 tablet (5 mg total) by mouth 2 (two) times daily.  . diphenhydramine-acetaminophen (TYLENOL PM) 25-500 MG TABS Take 2 tablets by mouth at bedtime as needed.  . doxazosin (CARDURA) 8 MG tablet Take 1 tablet (8 mg total) by mouth daily.  . furosemide (LASIX) 40 MG tablet Take 1 tablet (40 mg total) by mouth daily. (Patient taking differently: Take 20 mg by mouth daily. )  . levothyroxine (SYNTHROID) 75 MCG tablet Take 1 tablet by  mouth daily in the morning  . pantoprazole (PROTONIX) 40 MG tablet Take 1 tablet by mouth once daily  . quinapril (ACCUPRIL) 20 MG tablet Take 1 tablet (20 mg total) by mouth daily.  . rosuvastatin (CRESTOR) 20 MG tablet Take 1 tablet (20 mg total) by mouth daily.  [name] sulfamethoxazole-trimethoprim (BACTRIM DS) 800-160 MG tablet Take 1 tablet by mouth 2 (two) times daily.  [name] topiramate (TOPAMAX) 50 MG tablet Take 1 tablet (50 mg total) by mouth 2 (two) times daily.  . furosemide (LASIX) 20 MG tablet Take 2 tablets by mouth daily (Patient not taking: Reported on 05/24/2019)  . Iodoquinol-HC-Aloe Polysacch (ALCORTIN A) 1-2-1 % GEL as needed.    No facility-administered encounter medications on file as of 05/24/2019.    Activities of Daily Living In your present state of health, do you have any difficulty performing the following activities: 05/24/2019  Hearing? N  Vision? N  Difficulty concentrating or making decisions? N  Walking or climbing stairs? N  Dressing or bathing? N  Doing errands, shopping? N  Preparing Food and eating ? N  Using the Toilet? N  In the past six months, have you accidently leaked urine? N    Do you have problems with loss of bowel control? N  Managing your Medications? N  Managing your Finances? N  Housekeeping or managing your Housekeeping? N  Some recent data might be hidden    Patient Care Team: Jerrol Banana., MD as PCP - General (Unknown Physician Specialty) Dingeldein, Remo Lipps, MD as Consulting Physician (Ophthalmology) [place]ique, Peter M, MD as Consulting Physician (Cardiology) Elvina Mattes, Adele Schilder as Attending Physician (Podiatry) Ralene Bathe, MD (Dermatology)   Assessment:   This is a routine wellness examination for Jared Rice.  Exercise Activities and Dietary recommendations Current Exercise Habits: The patient does not participate in regular exercise at present, Exercise limited by: None identified  Goals    . Exercise 3x per week (30 min per time)     Recommend to start exercising 3 times a week for 30 minutes at a time.        Fall Risk Fall Risk  05/24/2019 05/18/2018 09/23/2017 05/15/2017 05/14/2016  Falls in the past year? 0 0 No Yes No  Number falls in past yr: 0 0 - 2 or more -  Injury with Fall? 0 0 - No -  Follow up - - - Falls prevention discussed -   FALL RISK PREVENTION PERTAINING TO THE HOME:  Any stairs in or around the home? Yes  If so, are there any without handrails? No   Home free of loose throw rugs in walkways, pet beds, electrical cords, etc? Yes  Adequate lighting in your home to reduce risk of falls? Yes   ASSISTIVE DEVICES UTILIZED TO PREVENT FALLS:  Life alert? No  Use of a cane, walker or w/c? Yes  Grab bars in the bathroom? Yes  Shower chair or bench in shower? Yes  Elevated toilet seat or a handicapped toilet? Yes    TIMED UP AND GO:  Was the test performed? No .    Depression Screen PHQ 2/9 Scores 05/24/2019 05/24/2019 05/18/2018 09/23/2017  PHQ - 2 Score 0 0 0 0    Cognitive Function     6CIT Screen 05/24/2019 05/18/2018 05/15/2017  What Year? 0 points 0 points 0 points  What month? 0 points  - 0 points  What time? 0 points 0 points 0 points  Count back from 20 0 points 0  points 0 points  Months in reverse 0 points 0 points 0 points  Repeat phrase 0 points 0 points 0 points  Total Score 0 - 0    Immunization History  Administered Date(s) Administered  . Fluad Quad(high Dose 65+) 02/24/2019  . Influenza Split 02/20/2011, 02/27/2012  . Influenza, High Dose Seasonal PF 02/22/2014, 02/18/2015, 02/29/2016, 02/26/2017, 04/22/2018  . Influenza,inj,Quad PF,6+ Mos 02/13/2013  . Influenza-Unspecified 02/24/2013  . Pneumococcal Conjugate-13 04/14/2014  . Pneumococcal Polysaccharide-23 03/25/1997, 04/05/2003  . Td 08/29/2003  . Tdap 02/20/2011  . Zoster 05/12/2007  . Zoster Recombinat (Shingrix) 02/26/2017, 06/25/2017    Qualifies for Shingles Vaccine? Completed series  Tdap: Up to date  Flu Vaccine: Up to date  Pneumococcal Vaccine: Completed series  Screening Tests Health Maintenance  Topic Date Due  . TETANUS/TDAP  02/19/2021  . INFLUENZA VACCINE  Completed  . PNA vac Low Risk Adult  Completed   Cancer Screenings:  Colorectal Screening: No longer required.   Lung Cancer Screening: (Low Dose CT Chest recommended if Age 19-80 years, 30 pack-year currently smoking OR have quit w/in 15years.) does not qualify.   Additional Screening:  Dental Screening: Recommended annual dental exams for proper oral hygiene  Community Resource Referral:  CRR required this visit?  No        Plan:  I have personally reviewed and addressed the Medicare Annual Wellness questionnaire and have noted the following in the patient's chart:  A. Medical and social history B. Use of alcohol, tobacco or illicit drugs  C. Current medications and supplements D. Functional ability and status E.  Nutritional status F.  Physical activity G. Advance directives H. List of other physicians I.  Hospitalizations, surgeries, and ER visits in previous 12 months J.  [place] such as  hearing and vision if needed, cognitive and depression L. Referrals and appointments   In addition, I have reviewed and discussed with patient certain preventive protocols, quality metrics, and best practice recommendations. A written personalized care plan for preventive services as well as general preventive health recommendations were provided to patient.   Glendora Score, LPN  [id number] Nurse Health Advisor  Nurse Notes: None.

## 2019-05-19 ENCOUNTER — Encounter: Payer: PPO | Attending: Internal Medicine | Admitting: Internal Medicine

## 2019-05-19 ENCOUNTER — Other Ambulatory Visit: Payer: Self-pay

## 2019-05-19 DIAGNOSIS — Z7901 Long term (current) use of anticoagulants: Secondary | ICD-10-CM | POA: Diagnosis not present

## 2019-05-19 DIAGNOSIS — I482 Chronic atrial fibrillation, unspecified: Secondary | ICD-10-CM | POA: Diagnosis not present

## 2019-05-19 DIAGNOSIS — I712 Thoracic aortic aneurysm, without rupture: Secondary | ICD-10-CM | POA: Diagnosis not present

## 2019-05-19 DIAGNOSIS — L97812 Non-pressure chronic ulcer of other part of right lower leg with fat layer exposed: Secondary | ICD-10-CM | POA: Diagnosis not present

## 2019-05-19 DIAGNOSIS — S8011XD Contusion of right lower leg, subsequent encounter: Secondary | ICD-10-CM | POA: Insufficient documentation

## 2019-05-19 DIAGNOSIS — E11622 Type 2 diabetes mellitus with other skin ulcer: Secondary | ICD-10-CM | POA: Diagnosis not present

## 2019-05-19 DIAGNOSIS — I87321 Chronic venous hypertension (idiopathic) with inflammation of right lower extremity: Secondary | ICD-10-CM | POA: Diagnosis not present

## 2019-05-19 DIAGNOSIS — Z96652 Presence of left artificial knee joint: Secondary | ICD-10-CM | POA: Diagnosis not present

## 2019-05-19 DIAGNOSIS — S81801A Unspecified open wound, right lower leg, initial encounter: Secondary | ICD-10-CM | POA: Diagnosis not present

## 2019-05-19 NOTE — Progress Notes (Signed)
INDRA, OKELLEY ([id number]) Visit Report for 05/19/2019 Abuse/Suicide Risk Screen Details Patient Name: Jared Rice, Jared Rice. Date of Service: 05/19/2019 2:15 PM Medical Record Number: [id number] Patient Account Number: [id number] Date of Birth/Sex: 30-Oct-1932 (85 y.o. M) Treating RN: Army Melia Primary Care Givanni Staron: Wilhemena Durie Other Clinician: Referring Romaine Neville: Wilhemena Durie Treating Michole Lecuyer/Extender: Tito Dine in Treatment: 0 Abuse/Suicide Risk Screen Items Answer ABUSE RISK SCREEN: Has anyone close to you tried to hurt or harm you recentlyo No Do you feel uncomfortable with anyone in your familyo No Has anyone forced you do things that you didnot want to doo No Electronic Signature(s) Signed: 05/19/2019 4:11:28 PM By: Army Melia Entered By: Army Melia on 05/19/2019 14:33:39 Elpers, Gerda Diss ([id number]) -------------------------------------------------------------------------------- Activities of Daily Living Details Patient Name: Jared Rice. Date of Service: 05/19/2019 2:15 PM Medical Record Number: [id number] Patient Account Number: [id number] Date of Birth/Sex: Mar 29, 1933 (85 y.o. M) Treating RN: Army Melia Primary Care Sander Speckman: Wilhemena Durie Other Clinician: Referring Ruta Capece: Wilhemena Durie Treating Faustina Gebert/Extender: Tito Dine in Treatment: 0 Activities of Daily Living Items Answer Activities of Daily Living (Please select one for each item) Drive Automobile Completely Able Take Medications Completely Able Use Telephone Completely Able Care for Appearance Completely Able Use Toilet Completely Able Bath / Shower Completely Able Dress Self Completely Able Feed Self Completely Able Walk Completely Able Get In / Out Bed Completely Able Housework Completely Able Prepare Meals Completely Able Handle Money Completely Able Shop for Self Completely Able Electronic Signature(s) Signed: 05/19/2019  4:11:28 PM By: Army Melia Entered By: Army Melia on 05/19/2019 14:33:54 Danish, Gerda Diss ([id number]) -------------------------------------------------------------------------------- Education Screening Details Patient Name: Jared Rice. Date of Service: 05/19/2019 2:15 PM Medical Record Number: [id number] Patient Account Number: [id number] Date of Birth/Sex: 1932-09-19 (85 y.o. M) Treating RN: Army Melia Primary Care Augustina Braddock: Wilhemena Durie Other Clinician: Referring Ruberta Holck: Wilhemena Durie Treating Gian Ybarra/Extender: Tito Dine in Treatment: 0 Primary Learner Assessed: Patient Learning Preferences/Education Level/Primary Language Learning Preference: Explanation Highest Education Level: College or Above Preferred Language: English Cognitive Barrier Language Barrier: No Translator Needed: No Memory Deficit: No Emotional Barrier: No Cultural/Religious Beliefs Affecting Medical Care: No Physical Barrier Impaired Vision: No Impaired Hearing: No Decreased Hand dexterity: No Knowledge/Comprehension Knowledge Level: High Comprehension Level: High Ability to understand written High instructions: Ability to understand verbal High instructions: Motivation Anxiety Level: Calm Cooperation: Cooperative Education Importance: Acknowledges Need Interest in Health Problems: Asks Questions Perception: Coherent Willingness to Engage in Self- High Management Activities: Readiness to Engage in Self- High Management Activities: Electronic Signature(s) Signed: 05/19/2019 4:11:28 PM By: Army Melia Entered By: Army Melia on 05/19/2019 14:34:23 Wilbourne, Gerda Diss ([id number]) -------------------------------------------------------------------------------- Fall Risk Assessment Details Patient Name: Jared Rice. Date of Service: 05/19/2019 2:15 PM Medical Record Number: [id number] Patient Account Number: [id number] Date of Birth/Sex: 14-Dec-1932  (85 y.o. M) Treating RN: Army Melia Primary Care Analyce Tavares: Cranford Mon, Delfino Lovett Other Clinician: Referring Umair Rosiles: Cranford Mon, RICHARD Treating Tyric Rodeheaver/Extender: Tito Dine in Treatment: 0 Fall Risk Assessment Items Have you had 2 or more falls in the last 12 monthso 0 No Have you had any fall that resulted in injury in the last 12 monthso 0 No FALLS RISK SCREEN History of falling - immediate or within 3 months 0 No Secondary diagnosis (Do you have 2 or more medical diagnoseso) 0 No Ambulatory aid None/bed rest/wheelchair/nurse 0 No Crutches/cane/walker 0 No Furniture 0 No Intravenous therapy Access/Saline/Heparin Lock 0 No  Gait/Transferring Normal/ bed rest/ wheelchair 0 No Weak (short steps with or without shuffle, stooped but able to lift head while 0 No walking, may seek support from furniture) Impaired (short steps with shuffle, may have difficulty arising from chair, head 0 No down, impaired balance) Mental Status Oriented to own ability 0 No Electronic Signature(s) Signed: 05/19/2019 4:11:28 PM By: Army Melia Entered By: Army Melia on 05/19/2019 14:34:30 Rodelo, Gerda Diss ([id number]) -------------------------------------------------------------------------------- Foot Assessment Details Patient Name: Jared Rice. Date of Service: 05/19/2019 2:15 PM Medical Record Number: [id number] Patient Account Number: [id number] Date of Birth/Sex: 09/03/1932 (85 y.o. M) Treating RN: Army Melia Primary Care Eriyah Fernando: Cranford Mon, Delfino Lovett Other Clinician: Referring Munirah Doerner: Cranford Mon, RICHARD Treating Kassius Battiste/Extender: Tito Dine in Treatment: 0 Foot Assessment Items Site Locations + = Sensation present, - = Sensation absent, C = Callus, U = Ulcer R = Redness, W = Warmth, M = Maceration, PU = Pre-ulcerative lesion F = Fissure, S = Swelling, D = Dryness Assessment Right: Left: Other Deformity: No No Prior Foot Ulcer: No No Prior  Amputation: No No Charcot Joint: No No Ambulatory Status: Ambulatory Without Help Gait: Electronic Signature(s) Signed: 05/19/2019 4:11:28 PM By: Army Melia Entered By: Army Melia on 05/19/2019 14:36:28 Schreurs, Gerda Diss ([id number]) -------------------------------------------------------------------------------- Nutrition Risk Screening Details Patient Name: Jared Rice. Date of Service: 05/19/2019 2:15 PM Medical Record Number: [id number] Patient Account Number: [id number] Date of Birth/Sex: 1932/12/31 (85 y.o. M) Treating RN: Army Melia Primary Care Fitzhugh Vizcarrondo: Cranford Mon, Delfino Lovett Other Clinician: Referring Aylin Rhoads: Cranford Mon, RICHARD Treating Kyllie Pettijohn/Extender: Tito Dine in Treatment: 0 Height (in): 73 Weight (lbs): 260 Body Mass Index (BMI): 34.3 Nutrition Risk Screening Items Score Screening NUTRITION RISK SCREEN: I have an illness or condition that made me change the kind and/or amount of 0 No food I eat I eat fewer than two meals per day 0 No I eat few fruits and vegetables, or milk products 0 No I have three or more drinks of beer, liquor or wine almost every day 0 No I have tooth or mouth problems that make it hard for me to eat 0 No I don't always have enough money to buy the food I need 0 No I eat alone most of the time 0 No I take three or more different prescribed or over-the-counter drugs Rice day 0 No Without wanting to, I have lost or gained 10 pounds in the last six months 0 No I am not always physically able to shop, cook and/or feed myself 0 No Nutrition Protocols Good Risk Protocol 0 No interventions needed Moderate Risk Protocol High Risk Proctocol Risk Level: Good Risk Score: 0 Electronic Signature(s) Signed: 05/19/2019 4:11:28 PM By: Army Melia Entered By: Army Melia on 05/19/2019 14:34:35

## 2019-05-24 ENCOUNTER — Other Ambulatory Visit: Payer: Self-pay

## 2019-05-24 ENCOUNTER — Ambulatory Visit (INDEPENDENT_AMBULATORY_CARE_PROVIDER_SITE_OTHER): Payer: PPO

## 2019-05-24 DIAGNOSIS — Z0000 Encounter for general adult medical examination without abnormal findings: Secondary | ICD-10-CM

## 2019-05-24 NOTE — Patient Instructions (Addendum)
Mr. Jared Rice , Thank you for taking time to come for your Medicare Wellness Visit. I appreciate your ongoing commitment to your health goals. Please review the following plan we discussed and let me know if I can assist you in the future.   Screening recommendations/referrals: Colonoscopy: No longer required.  Recommended yearly ophthalmology/optometry visit for glaucoma screening and checkup Recommended yearly dental visit for hygiene and checkup  Vaccinations: Influenza vaccine: Up to date Pneumococcal vaccine: Completed series Tdap vaccine: Up to date, due 01/2021 Shingles vaccine: Completed series    Advanced directives: Currently on file.   Conditions/risks identified: Recommend to start exercising 3 days a week for at least 30 minutes at a time.   Next appointment: 06/09/19 @ 11:20 AM with Dr Rosanna Randy. Declined scheduling an AWV for 2021 at this time.   Preventive Care 53 Years and Older, Male Preventive care refers to lifestyle choices and visits with your health care provider that can promote health and wellness. What does preventive care include?  A yearly physical exam. This is also called an annual well check.  Dental exams once or twice a year.  Routine eye exams. Ask your health care provider how often you should have your eyes checked.  Personal lifestyle choices, including:  Daily care of your teeth and gums.  Regular physical activity.  Eating a healthy diet.  Avoiding tobacco and drug use.  Limiting alcohol use.  Practicing safe sex.  Taking low doses of aspirin every day.  Taking vitamin and mineral supplements as recommended by your health care provider. What happens during an annual well check? The services and screenings done by your health care provider during your annual well check will depend on your age, overall health, lifestyle risk factors, and family history of disease. Counseling  Your health care provider may ask you questions about  your:  Alcohol use.  Tobacco use.  Drug use.  Emotional well-being.  Home and relationship well-being.  Sexual activity.  Eating habits.  History of falls.  Memory and ability to understand (cognition).  Work and work Statistician. Screening  You may have the following tests or measurements:  Height, weight, and BMI.  Blood pressure.  Lipid and cholesterol levels. These may be checked every 5 years, or more frequently if you are over 50 years old.  Skin check.  Lung cancer screening. You may have this screening every year starting at age 65 if you have a 30-pack-year history of smoking and currently smoke or have quit within the past 15 years.  Fecal occult blood test (FOBT) of the stool. You may have this test every year starting at age 16.  Flexible sigmoidoscopy or colonoscopy. You may have a sigmoidoscopy every 5 years or a colonoscopy every 10 years starting at age 89.  Prostate cancer screening. Recommendations will vary depending on your family history and other risks.  Hepatitis C blood test.  Hepatitis B blood test.  Sexually transmitted disease (STD) testing.  Diabetes screening. This is done by checking your blood sugar (glucose) after you have not eaten for a while (fasting). You may have this done every 1-3 years.  Abdominal aortic aneurysm (AAA) screening. You may need this if you are a current or former smoker.  Osteoporosis. You may be screened starting at age 38 if you are at high risk. Talk with your health care provider about your test results, treatment options, and if necessary, the need for more tests. Vaccines  Your health care provider may recommend certain vaccines,  such as:  Influenza vaccine. This is recommended every year.  Tetanus, diphtheria, and acellular pertussis (Tdap, Td) vaccine. You may need a Td booster every 10 years.  Zoster vaccine. You may need this after age 63.  Pneumococcal 13-valent conjugate (PCV13) vaccine.  One dose is recommended after age 25.  Pneumococcal polysaccharide (PPSV23) vaccine. One dose is recommended after age 49. Talk to your health care provider about which screenings and vaccines you need and how often you need them. This information is not intended to replace advice given to you by your health care provider. Make sure you discuss any questions you have with your health care provider. Document Released: 06/09/2015 Document Revised: 01/31/2016 Document Reviewed: 03/14/2015 Elsevier Interactive Patient Education  2017 [place] Prevention in the Home Falls can cause injuries. They can happen to people of all ages. There are many things you can do to make your home safe and to help prevent falls. What can I do on the outside of my home?  Regularly fix the edges of walkways and driveways and fix any cracks.  Remove anything that might make you trip as you walk through a door, such as a raised step or threshold.  Trim any bushes or trees on the path to your home.  Use bright outdoor lighting.  Clear any walking paths of anything that might make someone trip, such as rocks or tools.  Regularly check to see if handrails are loose or broken. Make sure that both sides of any steps have handrails.  Any raised decks and porches should have guardrails on the edges.  Have any leaves, snow, or ice cleared regularly.  Use sand or salt on walking paths during winter.  Clean up any spills in your garage right away. This includes oil or grease spills. What can I do in the bathroom?  Use night lights.  Install grab bars by the toilet and in the tub and shower. Do not use towel bars as grab bars.  Use non-skid mats or decals in the tub or shower.  If you need to sit down in the shower, use a plastic, non-slip stool.  Keep the floor dry. Clean up any water that spills on the floor as soon as it happens.  Remove soap buildup in the tub or shower regularly.  Attach bath  mats securely with double-sided non-slip rug tape.  Do not have throw rugs and other things on the floor that can make you trip. What can I do in the bedroom?  Use night lights.  Make sure that you have a light by your bed that is easy to reach.  Do not use any sheets or blankets that are too big for your bed. They should not hang down onto the floor.  Have a firm chair that has side arms. You can use this for support while you get dressed.  Do not have throw rugs and other things on the floor that can make you trip. What can I do in the kitchen?  Clean up any spills right away.  Avoid walking on wet floors.  Keep items that you use a lot in easy-to-reach places.  If you need to reach something above you, use a strong step stool that has a grab bar.  Keep electrical cords out of the way.  Do not use floor polish or wax that makes floors slippery. If you must use wax, use non-skid floor wax.  Do not have throw rugs and other things on  the floor that can make you trip. What can I do with my stairs?  Do not leave any items on the stairs.  Make sure that there are handrails on both sides of the stairs and use them. Fix handrails that are broken or loose. Make sure that handrails are as long as the stairways.  Check any carpeting to make sure that it is firmly attached to the stairs. Fix any carpet that is loose or worn.  Avoid having throw rugs at the top or bottom of the stairs. If you do have throw rugs, attach them to the floor with carpet tape.  Make sure that you have a light switch at the top of the stairs and the bottom of the stairs. If you do not have them, ask someone to add them for you. What else can I do to help prevent falls?  Wear shoes that:  Do not have high heels.  Have rubber bottoms.  Are comfortable and fit you well.  Are closed at the toe. Do not wear sandals.  If you use a stepladder:  Make sure that it is fully opened. Do not climb a closed  stepladder.  Make sure that both sides of the stepladder are locked into place.  Ask someone to hold it for you, if possible.  Clearly mark and make sure that you can see:  Any grab bars or handrails.  First and last steps.  Where the edge of each step is.  Use tools that help you move around (mobility aids) if they are needed. These include:  Canes.  Walkers.  Scooters.  Crutches.  Turn on the lights when you go into a dark area. Replace any light bulbs as soon as they burn out.  Set up your furniture so you have a clear path. Avoid moving your furniture around.  If any of your floors are uneven, fix them.  If there are any pets around you, be aware of where they are.  Review your medicines with your doctor. Some medicines can make you feel dizzy. This can increase your chance of falling. Ask your doctor what other things that you can do to help prevent falls. This information is not intended to replace advice given to you by your health care provider. Make sure you discuss any questions you have with your health care provider. Document Released: 03/09/2009 Document Revised: 10/19/2015 Document Reviewed: 06/17/2014 Elsevier Interactive Patient Education  2017 [employer].

## 2019-05-26 ENCOUNTER — Other Ambulatory Visit: Payer: Self-pay

## 2019-05-26 ENCOUNTER — Encounter: Payer: PPO | Admitting: Internal Medicine

## 2019-05-26 DIAGNOSIS — S81801A Unspecified open wound, right lower leg, initial encounter: Secondary | ICD-10-CM | POA: Diagnosis not present

## 2019-05-26 DIAGNOSIS — E11622 Type 2 diabetes mellitus with other skin ulcer: Secondary | ICD-10-CM | POA: Diagnosis not present
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
## 2019-05-27 NOTE — Progress Notes (Signed)
MARLONE, PARADISO ([id number]) Visit Report for 05/26/2019 Arrival Information Details Patient Name: Jared Rice, Jared Rice. Date of Service: 05/26/2019 12:30 PM Medical Record Number: [id number] Patient Account Number: [id number] Date of Birth/Sex: [birth year] (85 y.o. M) Treating RN: Army Melia Primary Care Reshaun Briseno: Cranford Mon, Delfino Lovett Other Clinician: Referring Abaigeal Moomaw: Wilhemena Durie Treating Quorra Rosene/Extender: Tito Dine in Treatment: 1 Visit Information History Since Last Visit Added or deleted any medications: No Patient Arrived: Cane Any new allergies or adverse reactions: No Arrival Time: 12:39 Had a fall or experienced change in No Accompanied By: self activities of daily living that may affect Transfer Assistance: None risk of falls: Patient Identification Verified: Yes Signs or symptoms of abuse/neglect since last visito No Hospitalized since last visit: No Has Dressing in Place as Prescribed: Yes Pain Present Now: No Electronic Signature(s) Signed: 05/26/2019 2:59:41 PM By: Army Melia Entered By: Army Melia on 05/26/2019 12:39:28 Tuite, Gerda Diss ([id number]) -------------------------------------------------------------------------------- Clinic Level of Care Assessment Details Patient Name: Jared Dopp A. Date of Service: 05/26/2019 12:30 PM Medical Record Number: [id number] Patient Account Number: [id number] Date of Birth/Sex: [birth year] (85 y.o. M) Treating RN: Harold Barban Primary Care Shamicka Inga: Cranford Mon, Delfino Lovett Other Clinician: Referring Shirly Bartosiewicz: Cranford Mon, RICHARD Treating Shell Blanchette/Extender: Tito Dine in Treatment: 1 Clinic Level of Care Assessment Items TOOL 4 Quantity Score []  - Use when only an EandM is performed on FOLLOW-UP visit 0 ASSESSMENTS - Nursing Assessment / Reassessment X - Reassessment of Co-morbidities (includes updates in patient status) 1 10 X- 1 5 Reassessment of Adherence to Treatment  Plan ASSESSMENTS - Wound and Skin Assessment / Reassessment X - Simple Wound Assessment / Reassessment - one wound 1 5 []  - 0 Complex Wound Assessment / Reassessment - multiple wounds []  - 0 Dermatologic / Skin Assessment (not related to wound area) ASSESSMENTS - Focused Assessment []  - Circumferential Edema Measurements - multi extremities 0 []  - 0 Nutritional Assessment / Counseling / Intervention []  - 0 Lower Extremity Assessment (monofilament, tuning fork, pulses) []  - 0 Peripheral Arterial Disease Assessment (using hand held doppler) ASSESSMENTS - Ostomy and/or Continence Assessment and Care []  - Incontinence Assessment and Management 0 []  - 0 Ostomy Care Assessment and Management (repouching, etc.) PROCESS - Coordination of Care X - Simple Patient / Family Education for ongoing care 1 15 []  - 0 Complex (extensive) Patient / Family Education for ongoing care []  - 0 Staff obtains Programmer, systems, Records, Test Results / Process Orders []  - 0 Staff telephones HHA, Nursing Homes / Clarify orders / etc []  - 0 Routine Transfer to another Facility (non-emergent condition) []  - 0 Routine Hospital Admission (non-emergent condition) []  - 0 New Admissions / Biomedical engineer / Ordering NPWT, Apligraf, etc. []  - 0 Emergency Hospital Admission (emergent condition) X- 1 10 Simple Discharge Coordination CONAGHER, FAIRLESS A. ([id number]) []  - 0 Complex (extensive) Discharge Coordination PROCESS - Special Needs []  - Pediatric / Minor Patient Management 0 []  - 0 Isolation Patient Management []  - 0 Hearing / Language / Visual special needs []  - 0 Assessment of Community assistance (transportation, D/C planning, etc.) []  - 0 Additional assistance / Altered mentation []  - 0 Support Surface(s) Assessment (bed, cushion, seat, etc.) INTERVENTIONS - Wound Cleansing / Measurement X - Simple Wound Cleansing - one wound 1 5 []  - 0 Complex Wound Cleansing - multiple wounds X- 1 5 Wound  Imaging (photographs - any number of wounds) []  - 0 Wound Tracing (instead of photographs) X- 1 5 Simple Wound Measurement -  one wound []  - 0 Complex Wound Measurement - multiple wounds INTERVENTIONS - Wound Dressings X - Small Wound Dressing one or multiple wounds 1 10 []  - 0 Medium Wound Dressing one or multiple wounds []  - 0 Large Wound Dressing one or multiple wounds []  - 0 Application of Medications - topical []  - 0 Application of Medications - injection INTERVENTIONS - Miscellaneous []  - External ear exam 0 []  - 0 Specimen Collection (cultures, biopsies, blood, body fluids, etc.) []  - 0 Specimen(s) / Culture(s) sent or taken to Lab for analysis []  - 0 Patient Transfer (multiple staff / Civil Service fast streamer / Similar devices) []  - 0 Simple Staple / Suture removal (25 or less) []  - 0 Complex Staple / Suture removal (26 or more) []  - 0 Hypo / Hyperglycemic Management (close monitor of Blood Glucose) []  - 0 Ankle / Brachial Index (ABI) - do not check if billed separately X- 1 5 Vital Signs Wingrove, Nathanyel A. ([id number]) Has the patient been seen at the hospital within the last three years: Yes Total Score: 75 Level Of Care: New/Established - Level 2 Electronic Signature(s) Signed: 05/26/2019 3:13:29 PM By: Harold Barban Entered By: Harold Barban on 05/26/2019 12:51:31 [name] ([id number]) -------------------------------------------------------------------------------- Encounter Discharge Information Details Patient Name: Jared Dopp A. Date of Service: 05/26/2019 12:30 PM Medical Record Number: [id number] Patient Account Number: [id number] Date of Birth/Sex: 1932-07-09 (85 y.o. M) Treating RN: Harold Barban Primary Care Eilam Shrewsbury: Cranford Mon, Delfino Lovett Other Clinician: Referring Macall Mccroskey: Cranford Mon, RICHARD Treating Laveyah Oriol/Extender: Tito Dine in Treatment: 1 Encounter Discharge Information Items Discharge Condition: Stable Ambulatory  Status: Ambulatory Discharge Destination: Home Transportation: Private Auto Accompanied By: self Schedule Follow-up Appointment: Yes Clinical Summary of Care: Electronic Signature(s) Signed: 05/26/2019 3:13:29 PM By: Harold Barban Entered By: Harold Barban on 05/26/2019 12:54:23 Taras, Gerda Diss ([id number]) -------------------------------------------------------------------------------- Lower Extremity Assessment Details Patient Name: Jared Dopp A. Date of Service: 05/26/2019 12:30 PM Medical Record Number: [id number] Patient Account Number: [id number] Date of Birth/Sex: 03-Dec-1932 (85 y.o. M) Treating RN: Army Melia Primary Care Gaje Tennyson: Wilhemena Durie Other Clinician: Referring Valdez Brannan: Cranford Mon, RICHARD Treating Tomeca Helm/Extender: Ricard Dillon Weeks in Treatment: 1 Edema Assessment Assessed: [Left: No] [Right: No] Edema: [Left: N] [Right: o] Vascular Assessment Pulses: Dorsalis Pedis Palpable: [Right:Yes] Electronic Signature(s) Signed: 05/26/2019 2:59:41 PM By: Army Melia Entered By: Army Melia on 05/26/2019 12:43:57 Natal, Gerda Diss ([id number]) -------------------------------------------------------------------------------- Multi Wound Chart Details Patient Name: Jared Dopp A. Date of Service: 05/26/2019 12:30 PM Medical Record Number: [id number] Patient Account Number: [id number] Date of Birth/Sex: Jun 05, 1932 (85 y.o. M) Treating RN: Harold Barban Primary Care Lyrica Mcclarty: Cranford Mon, Delfino Lovett Other Clinician: Referring Jyair Kiraly: Cranford Mon, RICHARD Treating Kerriann Kamphuis/Extender: Tito Dine in Treatment: 1 Vital Signs Height(in): 73 Pulse(bpm): 92 Weight(lbs): 260 Blood Pressure(mmHg): 119/80 Body Mass Index(BMI): 34 Temperature(F): 97.8 Respiratory Rate 16 (breaths/min): Photos: [N/A:N/A] Wound Location: Right Lower Leg - Anterior N/A N/A Wounding Event: Trauma N/A N/A Primary Etiology: Trauma, Other N/A  N/A Comorbid History: Cataracts N/A N/A Date Acquired: 02/24/2019 N/A N/A Weeks of Treatment: 1 N/A N/A Wound Status: Open N/A N/A Measurements L x W x D 1x0.5x0.3 N/A N/A (cm) Area (cm) : 0.393 N/A N/A Volume (cm) : 0.118 N/A N/A % Reduction in Area: 55.30% N/A N/A % Reduction in Volume: 33.00% N/A N/A Classification: Partial Thickness N/A N/A Exudate Amount: Medium N/A N/A Exudate Type: Sanguinous N/A N/A Exudate Color: red N/A N/A Granulation Amount: Medium (34-66%) N/A N/A Granulation Quality: Red N/A N/A  Necrotic Amount: Medium (34-66%) N/A N/A Necrotic Tissue: Eschar N/A N/A Exposed Structures: Fat Layer (Subcutaneous N/A N/A Tissue) Exposed: Yes Fascia: No Tendon: No Muscle: No Joint: No Bone: No Epithelialization: None N/A N/A ISAHA, CONTORNO ([id number]) Treatment Notes Electronic Signature(s) Signed: 05/26/2019 4:48:37 PM By: Linton Ham MD Entered By: Linton Ham on 05/26/2019 12:52:31 Neils, Gerda Diss ([id number]) -------------------------------------------------------------------------------- [place] Details Patient Name: Jared Dopp A. Date of Service: 05/26/2019 12:30 PM Medical Record Number: [id number] Patient Account Number: [id number] Date of Birth/Sex: 09/02/1932 (85 y.o. M) Treating RN: Harold Barban Primary Care Colman Birdwell: Cranford Mon, Delfino Lovett Other Clinician: Referring Keari Miu: Cranford Mon, RICHARD Treating Aislin Onofre/Extender: Tito Dine in Treatment: 1 Active Inactive Necrotic Tissue Nursing Diagnoses: Impaired tissue integrity related to necrotic/devitalized tissue Goals: Necrotic/devitalized tissue will be minimized in the wound bed Date Initiated: 05/19/2019 Target Resolution Date: 05/26/2019 Goal Status: Active Interventions: Assess patient pain level pre-, during and post procedure and prior to discharge Treatment Activities: Excisional debridement : 05/19/2019 Notes: Orientation to the  Wound Care Program Nursing Diagnoses: Knowledge deficit related to the wound healing center program Goals: Patient/caregiver will verbalize understanding of the [place] Program Date Initiated: 05/19/2019 Target Resolution Date: 05/26/2019 Goal Status: Active Interventions: Provide education on orientation to the wound center Notes: Wound/Skin Impairment Nursing Diagnoses: Impaired tissue integrity Goals: Patient/caregiver will verbalize understanding of skin care regimen Date Initiated: 05/19/2019 Target Resolution Date: 05/26/2019 Goal Status: Active SYMEON, SCHEID ([id number]) Interventions: Assess patient/caregiver ability to obtain necessary supplies Assess ulceration(s) every visit Treatment Activities: Skin care regimen initiated : 05/19/2019 Notes: Electronic Signature(s) Signed: 05/26/2019 3:13:29 PM By: Harold Barban Entered By: Harold Barban on 05/26/2019 12:48:27 Kirshner, Gerda Diss ([id number]) -------------------------------------------------------------------------------- Pain Assessment Details Patient Name: Jared Dopp A. Date of Service: 05/26/2019 12:30 PM Medical Record Number: [id number] Patient Account Number: [id number] Date of Birth/Sex: December 05, 1932 (85 y.o. M) Treating RN: Army Melia Primary Care Joshaua Epple: Wilhemena Durie Other Clinician: Referring Juliza Machnik: Wilhemena Durie Treating Ruben Pyka/Extender: Tito Dine in Treatment: 1 Active Problems Location of Pain Severity and Description of Pain Patient Has Paino No Site Locations Pain Management and Medication Current Pain Management: Electronic Signature(s) Signed: 05/26/2019 2:59:41 PM By: Army Melia Entered By: Army Melia on 05/26/2019 12:39:33 Morga, Gerda Diss ([id number]) -------------------------------------------------------------------------------- Patient/Caregiver Education Details Patient Name: Jared Dopp A. Date of Service:  05/26/2019 12:30 PM Medical Record Number: [id number] Patient Account Number: [id number] Date of Birth/Gender: 14-Oct-1932 (85 y.o. M) Treating RN: Harold Barban Primary Care Physician: Cranford Mon, Delfino Lovett Other Clinician: Referring Physician: Wilhemena Durie Treating Physician/Extender: Tito Dine in Treatment: 1 Education Assessment Education Provided To: Patient Education Topics Provided Wound/Skin Impairment: Handouts: Caring for Your Ulcer Methods: Demonstration, Explain/Verbal Responses: State content correctly Electronic Signature(s) Signed: 05/26/2019 3:13:29 PM By: Harold Barban Entered By: Harold Barban on 05/26/2019 12:49:12 Mallis, Gerda Diss ([id number]) -------------------------------------------------------------------------------- Wound Assessment Details Patient Name: Jared Dopp A. Date of Service: 05/26/2019 12:30 PM Medical Record Number: [id number] Patient Account Number: [id number] Date of Birth/Sex: 08-17-1932 (85 y.o. M) Treating RN: Army Melia Primary Care Dinero Chavira: Cranford Mon, Delfino Lovett Other Clinician: Referring Madalyn Legner: Cranford Mon, RICHARD Treating Merrisa Skorupski/Extender: Tito Dine in Treatment: 1 Wound Status Wound Number: 1 Primary Etiology: Trauma, Other Wound Location: Right Lower Leg - Anterior Wound Status: Open Wounding Event: Trauma Comorbid History: Cataracts Date Acquired: 02/24/2019 Weeks Of Treatment: 1 Clustered Wound: No Photos Wound Measurements Length: (cm) 1 Width: (cm) 0.5 Depth: (cm) 0.3 Area: (cm) 0.393 Volume: (cm)  0.118 % Reduction in Area: 55.3% % Reduction in Volume: 33% Epithelialization: None Wound Description Classification: Partial Thickness Foul Odor Aft Exudate Amount: Medium Slough/Fibrin Exudate Type: Sanguinous Exudate Color: red er Cleansing: No o Yes Wound Bed Granulation Amount: Medium (34-66%) Exposed Structure Granulation Quality: Red Fascia Exposed:  No Necrotic Amount: Medium (34-66%) Fat Layer (Subcutaneous Tissue) Exposed: Yes Necrotic Quality: Eschar Tendon Exposed: No Muscle Exposed: No Joint Exposed: No Bone Exposed: No Treatment Notes Wound #1 (Right, Anterior Lower Leg) Boylan, Toryn A. ([id number]) Notes Silver collagen, Moisten with Hydragel, BFD Electronic Signature(s) Signed: 05/26/2019 2:59:41 PM By: Army Melia Entered By: Army Melia on 05/26/2019 12:43:07 Maggio, Gerda Diss ([id number]) -------------------------------------------------------------------------------- Vitals Details Patient Name: Jared Dopp A. Date of Service: 05/26/2019 12:30 PM Medical Record Number: [id number] Patient Account Number: [id number] Date of Birth/Sex: Oct 23, 1932 (85 y.o. M) Treating RN: Army Melia Primary Care Jannae Fagerstrom: Cranford Mon, Delfino Lovett Other Clinician: Referring Jwan Hornbaker: Cranford Mon, RICHARD Treating Zakee Deerman/Extender: Tito Dine in Treatment: 1 Vital Signs Time Taken: 12:39 Temperature (F): 97.8 Height (in): 73 Pulse (bpm): 92 Weight (lbs): 260 Respiratory Rate (breaths/min): 16 Body Mass Index (BMI): 34.3 Blood Pressure (mmHg): 119/80 Reference Range: 80 - 120 mg / dl Electronic Signature(s) Signed: 05/26/2019 2:59:41 PM By: Army Melia Entered By: Army Melia on 05/26/2019 12:40:11

## 2019-06-02 ENCOUNTER — Encounter: Payer: PPO | Attending: Internal Medicine | Admitting: Internal Medicine

## 2019-06-02 ENCOUNTER — Other Ambulatory Visit: Payer: Self-pay

## 2019-06-02 DIAGNOSIS — Z7901 Long term (current) use of anticoagulants: Secondary | ICD-10-CM | POA: Insufficient documentation

## 2019-06-02 DIAGNOSIS — Z96652 Presence of left artificial knee joint: Secondary | ICD-10-CM | POA: Insufficient documentation

## 2019-06-02 DIAGNOSIS — I872 Venous insufficiency (chronic) (peripheral): Secondary | ICD-10-CM | POA: Insufficient documentation

## 2019-06-02 DIAGNOSIS — I482 Chronic atrial fibrillation, unspecified: Secondary | ICD-10-CM | POA: Diagnosis not present

## 2019-06-02 DIAGNOSIS — E11622 Type 2 diabetes mellitus with other skin ulcer: Secondary | ICD-10-CM | POA: Insufficient documentation

## 2019-06-02 DIAGNOSIS — L97812 Non-pressure chronic ulcer of other part of right lower leg with fat layer exposed: Secondary | ICD-10-CM | POA: Insufficient documentation

## 2019-06-02 DIAGNOSIS — I712 Thoracic aortic aneurysm, without rupture: Secondary | ICD-10-CM | POA: Diagnosis not present

## 2019-06-02 DIAGNOSIS — S81801A Unspecified open wound, right lower leg, initial encounter: Secondary | ICD-10-CM | POA: Diagnosis not present

## 2019-06-02 DIAGNOSIS — S8011XD Contusion of right lower leg, subsequent encounter: Secondary | ICD-10-CM | POA: Insufficient documentation

## 2019-06-02 DIAGNOSIS — X58XXXD Exposure to other specified factors, subsequent encounter: Secondary | ICD-10-CM | POA: Insufficient documentation

## 2019-06-02 NOTE — Progress Notes (Signed)
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

## 2019-06-02 NOTE — Progress Notes (Signed)
CHRISTOBAL, ALSBROOKS ([id number]) Visit Report for 05/19/2019 Allergy List Details Patient Name: Jared Rice, Jared Rice. Date of Service: 05/19/2019 2:15 PM Medical Record Number: [id number] Patient Account Number: [id number] Date of Birth/Sex: [birth year] (86 y.o. M) Treating RN: Army Melia Primary Care Ky Rumple: Wilhemena Durie Other Clinician: Referring Aram Domzalski: Cranford Mon, RICHARD Treating Algie Westry/Extender: Ricard Dillon Weeks in Treatment: 0 Allergies Active Allergies No Known Allergies Allergy Notes Electronic Signature(s) Signed: 05/19/2019 4:11:28 PM By: Army Melia Entered By: Army Melia on 05/19/2019 14:27:51 Alkins, Gerda Diss ([id number]) -------------------------------------------------------------------------------- Arrival Information Details Patient Name: Jared Dopp A. Date of Service: 05/19/2019 2:15 PM Medical Record Number: [id number] Patient Account Number: [id number] Date of Birth/Sex: Jul 17, 1932 (86 y.o. M) Treating RN: Army Melia Primary Care Jasmeet Gehl: Wilhemena Durie Other Clinician: Referring Zakirah Weingart: Cranford Mon, RICHARD Treating Nastassia Bazaldua/Extender: Tito Dine in Treatment: 0 Visit Information Patient Arrived: Cane Arrival Time: 14:26 Accompanied By: self Transfer Assistance: None Patient Identification Verified: Yes Electronic Signature(s) Signed: 05/19/2019 4:11:28 PM By: Army Melia Entered By: Army Melia on 05/19/2019 14:26:49 Coad, Gerda Diss ([id number]) -------------------------------------------------------------------------------- Clinic Level of Care Assessment Details Patient Name: Jared Dopp A. Date of Service: 05/19/2019 2:15 PM Medical Record Number: [id number] Patient Account Number: [id number] Date of Birth/Sex: 02-Mar-1933 (86 y.o. M) Treating RN: Cornell Barman Primary Care Izayiah Tibbitts: Cranford Mon, Delfino Lovett Other Clinician: Referring Crist Kruszka: Cranford Mon, RICHARD Treating Aliesha Dolata/Extender: Tito Dine in Treatment: 0 Clinic Level of Care Assessment Items TOOL 1 Quantity Score []  - Use when EandM and Procedure is performed on INITIAL visit 0 ASSESSMENTS - Nursing Assessment / Reassessment X - General Physical Exam (combine w/ comprehensive assessment (listed just below) when 1 20 performed on new pt. evals) X- 1 25 Comprehensive Assessment (HX, ROS, Risk Assessments, Wounds Hx, etc.) ASSESSMENTS - Wound and Skin Assessment / Reassessment []  - Dermatologic / Skin Assessment (not related to wound area) 0 ASSESSMENTS - Ostomy and/or Continence Assessment and Care []  - Incontinence Assessment and Management 0 []  - 0 Ostomy Care Assessment and Management (repouching, etc.) PROCESS - Coordination of Care X - Simple Patient / Family Education for ongoing care 1 15 []  - 0 Complex (extensive) Patient / Family Education for ongoing care []  - 0 Staff obtains Programmer, systems, Records, Test Results / Process Orders []  - 0 Staff telephones HHA, Nursing Homes / Clarify orders / etc []  - 0 Routine Transfer to another Facility (non-emergent condition) []  - 0 Routine Hospital Admission (non-emergent condition) X- 1 15 New Admissions / Biomedical engineer / Ordering NPWT, Apligraf, etc. []  - 0 Emergency Hospital Admission (emergent condition) PROCESS - Special Needs []  - Pediatric / Minor Patient Management 0 []  - 0 Isolation Patient Management []  - 0 Hearing / Language / Visual special needs []  - 0 Assessment of Community assistance (transportation, D/C planning, etc.) []  - 0 Additional assistance / Altered mentation []  - 0 Support Surface(s) Assessment (bed, cushion, seat, etc.) Ford, Kengo A. ([id number]) INTERVENTIONS - Miscellaneous []  - External ear exam 0 []  - 0 Patient Transfer (multiple staff / Civil Service fast streamer / Similar devices) []  - 0 Simple Staple / Suture removal (25 or less) []  - 0 Complex Staple / Suture removal (26 or more) []  - 0 Hypo/Hyperglycemic Management  (do not check if billed separately) X- 1 15 Ankle / Brachial Index (ABI) - do not check if billed separately Has the patient been seen at the hospital within the last three years: Yes Total Score: 90 Level Of Care: New/Established - Level 3  Electronic Signature(s) Signed: 06/02/2019 4:51:46 PM By: Gretta Cool, BSN, RN, CWS, Kim RN, BSN Entered By: Gretta Cool, BSN, RN, CWS, Kim on 05/19/2019 15:07:19 [name] -------------------------------------------------------------------------------- Encounter Discharge Information Details Patient Name: [name], [name] Date of Service: 05/19/2019 2:15 PM Medical Record Number: [id number] Patient Account Number: [id number] Date of Birth/Sex: [birth year] (86 y.o. M) Treating RN: Cornell Barman Primary Care Maxie Slovacek: Cranford Mon, Delfino Lovett Other Clinician: Referring Euclide Granito: Cranford Mon, RICHARD Treating Betsy Rosello/Extender: Tito Dine in Treatment: 0 Encounter Discharge Information Items Post Procedure Vitals Discharge Condition: Stable Temperature (F): 97.3 Ambulatory Status: Cane Pulse (bpm): 76 Discharge Destination: Home Respiratory Rate (breaths/min): 16 Transportation: Private Auto Blood Pressure (mmHg): 123/73 Accompanied By: self Schedule Follow-up Appointment: Yes Clinical Summary of Care: Electronic Signature(s) Signed: 06/02/2019 4:51:46 PM By: Gretta Cool, BSN, RN, CWS, Kim RN, BSN Entered By: Gretta Cool, BSN, RN, CWS, Kim on 05/19/2019 15:08:48 [name] -------------------------------------------------------------------------------- Lower Extremity Assessment Details Patient Name: Jared Dopp A. Date of Service: 05/19/2019 2:15 PM Medical Record Number: [id number] Patient Account Number: [id number] Date of Birth/Sex: [birth year] (86 y.o. M) Treating RN: Army Melia Primary Care Marisela Line: Cranford Mon, Delfino Lovett Other Clinician: Referring Gustaf Mccarter: Cranford Mon, RICHARD Treating Estanislao Harmon/Extender: Tito Dine in Treatment: 0 Edema Assessment Assessed: [Left: No] [Right: No] Edema: [Left: N] [Right: o] Calf Left: Right: Point of Measurement: 40 cm From Medial Instep cm 40 cm Ankle Left: Right: Point of Measurement: 12 cm From Medial Instep cm 27 cm Vascular Assessment Pulses: Dorsalis Pedis Palpable: [Right:Yes] Doppler Audible: [Right:Yes] Posterior Tibial Palpable: [Right:No] Doppler Audible: [Right:Inaudible] Blood Pressure: Brachial: [Right:128] Ankle: [Right:Dorsalis Pedis: 160 1.25] Electronic Signature(s) Signed: 05/19/2019 4:11:28 PM By: Army Melia Entered By: Army Melia on 05/19/2019 14:49:23 Martes, Gerda Diss ([id number]) -------------------------------------------------------------------------------- Multi Wound Chart Details Patient Name: Jared Dopp A. Date of Service: 05/19/2019 2:15 PM Medical Record Number: [id number] Patient Account Number: [id number] Date of Birth/Sex: 01-26-1933 (86 y.o. M) Treating RN: Cornell Barman Primary Care Ita Fritzsche: Cranford Mon, Delfino Lovett Other Clinician: Referring Suzannah Bettes: Cranford Mon, RICHARD Treating Blade Scheff/Extender: Tito Dine in Treatment: 0 Vital Signs Height(in): 73 Pulse(bpm): 76 Weight(lbs): 260 Blood Pressure(mmHg): 123/73 Body Mass Index(BMI): 34 Temperature(F): 97.3 Respiratory Rate 16 (breaths/min): Photos: [N/A:N/A] Wound Location: Right Lower Leg - Anterior N/A N/A Wounding Event: Trauma N/A N/A Primary Etiology: Trauma, Other N/A N/A Comorbid History: Cataracts N/A N/A Date Acquired: 02/24/2019 N/A N/A Weeks of Treatment: 0 N/A N/A Wound Status: Open N/A N/A Measurements L x W x D 1.4x0.8x0.2 N/A N/A (cm) Area (cm) : 0.88 N/A N/A Volume (cm) : 0.176 N/A N/A Classification: Partial Thickness N/A N/A Exudate Amount: Medium N/A N/A Exudate Type: Sanguinous N/A N/A Exudate Color: red N/A N/A Granulation Amount: Medium (34-66%) N/A N/A Granulation Quality: Red N/A  N/A Necrotic Amount: Medium (34-66%) N/A N/A Necrotic Tissue: Eschar N/A N/A Exposed Structures: Fat Layer (Subcutaneous N/A N/A Tissue) Exposed: Yes Fascia: No Tendon: No Muscle: No Joint: No Bone: No Epithelialization: None N/A N/A Treatment Notes KHAIRO, ALONGI ([id number]) Electronic Signature(s) Signed: 06/02/2019 4:51:46 PM By: Gretta Cool, BSN, RN, CWS, Kim RN, BSN Entered By: Gretta Cool, BSN, RN, CWS, Kim on 05/19/2019 15:04:34 [name] -------------------------------------------------------------------------------- [place] Details Patient Name: Jared Dopp A. Date of Service: 05/19/2019 2:15 PM Medical Record Number: [id number] Patient Account Number: [id number] Date of Birth/Sex: 06-25-1932 (86 y.o. M) Treating RN: Cornell Barman Primary Care Eitan Doubleday: Cranford Mon, Delfino Lovett Other Clinician: Referring Tabbitha Janvrin: Cranford Mon, RICHARD Treating Herold Salguero/Extender: Tito Dine in  Treatment: 0 Active Inactive Necrotic Tissue Nursing Diagnoses: Impaired tissue integrity related to necrotic/devitalized tissue Goals: Necrotic/devitalized tissue will be minimized in the wound bed Date Initiated: 05/19/2019 Target Resolution Date: 05/26/2019 Goal Status: Active Interventions: Assess patient pain level pre-, during and post procedure and prior to discharge Treatment Activities: Excisional debridement : 05/19/2019 Notes: Orientation to the Wound Care Program Nursing Diagnoses: Knowledge deficit related to the wound healing center program Goals: Patient/caregiver will verbalize understanding of the [facility] Date Initiated: 05/19/2019 Target Resolution Date: 05/26/2019 Goal Status: Active Interventions: Provide education on orientation to the wound center Notes: Wound/Skin Impairment Nursing Diagnoses: Impaired tissue integrity Goals: Patient/caregiver will verbalize understanding of skin care regimen Date  Initiated: 05/19/2019 Target Resolution Date: 05/26/2019 Goal Status: Active BADEN, LEAL ([id number]) Interventions: Assess patient/caregiver ability to obtain necessary supplies Assess ulceration(s) every visit Treatment Activities: Skin care regimen initiated : 05/19/2019 Notes: Electronic Signature(s) Signed: 06/02/2019 4:51:46 PM By: Gretta Cool, BSN, RN, CWS, Kim RN, BSN Entered By: Gretta Cool, BSN, RN, CWS, Kim on 05/19/2019 15:04:21 Jared Rice ([id number]) -------------------------------------------------------------------------------- Pain Assessment Details Patient Name: Jared Dopp A. Date of Service: 05/19/2019 2:15 PM Medical Record Number: [id number] Patient Account Number: [id number] Date of Birth/Sex: November 27, 1932 (86 y.o. M) Treating RN: Army Melia Primary Care Keriana Sarsfield: Wilhemena Durie Other Clinician: Referring Dannika Hilgeman: Wilhemena Durie Treating Khristi Schiller/Extender: Tito Dine in Treatment: 0 Active Problems Location of Pain Severity and Description of Pain Patient Has Paino No Site Locations Pain Management and Medication Current Pain Management: Electronic Signature(s) Signed: 05/19/2019 4:11:28 PM By: Army Melia Entered By: Army Melia on 05/19/2019 14:26:55 Heidrick, Gerda Diss ([id number]) -------------------------------------------------------------------------------- Patient/Caregiver Education Details Patient Name: Jared Dopp A. Date of Service: 05/19/2019 2:15 PM Medical Record Number: [id number] Patient Account Number: [id number] Date of Birth/Gender: [birth year] (86 y.o. M) Treating RN: Cornell Barman Primary Care Physician: Cranford Mon, Delfino Lovett Other Clinician: Referring Physician: Wilhemena Durie Treating Physician/Extender: Tito Dine in Treatment: 0 Education Assessment Education Provided To: Patient Education Topics Provided Wound Debridement: Handouts: Wound Debridement Methods: Demonstration,  Explain/Verbal Responses: State content correctly Wound/Skin Impairment: Handouts: Caring for Your Ulcer Methods: Demonstration, Explain/Verbal Responses: Return demonstration correctly, State content correctly Electronic Signature(s) Signed: 06/02/2019 4:51:46 PM By: Gretta Cool, BSN, RN, CWS, Kim RN, BSN Entered By: Gretta Cool, BSN, RN, CWS, Kim on 05/19/2019 15:07:43 Jared Rice ([id number]) -------------------------------------------------------------------------------- Wound Assessment Details Patient Name: Jared Dopp A. Date of Service: 05/19/2019 2:15 PM Medical Record Number: [id number] Patient Account Number: [id number] Date of Birth/Sex: 19-Aug-1932 (86 y.o. M) Treating RN: Army Melia Primary Care Yajayra Feldt: Cranford Mon, Delfino Lovett Other Clinician: Referring Rondia Higginbotham: Cranford Mon, RICHARD Treating Alexandre Faries/Extender: Tito Dine in Treatment: 0 Wound Status Wound Number: 1 Primary Etiology: Trauma, Other Wound Location: Right Lower Leg - Anterior Wound Status: Open Wounding Event: Trauma Comorbid History: Cataracts Date Acquired: 02/24/2019 Weeks Of Treatment: 0 Clustered Wound: No Photos Wound Measurements Length: (cm) 1.4 % Reduction i Width: (cm) 0.8 % Reduction i Depth: (cm) 0.2 Epithelializa Area: (cm) 0.88 Tunneling: Volume: (cm) 0.176 Undermining: n Area: n Volume: tion: None No No Wound Description Classification: Partial Thickness Foul Odor Aft Exudate Amount: Medium Slough/Fibrin Exudate Type: Sanguinous Exudate Color: red er Cleansing: No o Yes Wound Bed Granulation Amount: Medium (34-66%) Exposed Structure Granulation Quality: Red Fascia Exposed: No Necrotic Amount: Medium (34-66%) Fat Layer (Subcutaneous Tissue) Exposed: Yes Necrotic Quality: Eschar Tendon Exposed: No Muscle Exposed: No Joint Exposed: No Bone Exposed: No Electronic Signature(s) Signed: 05/19/2019 4:11:28 PM  By: Dimas Chyle, Gerda Diss ([id number]) Entered  By: Army Melia on 05/19/2019 14:38:43 Crescent, Gerda Diss ([id number]) -------------------------------------------------------------------------------- Vitals Details Patient Name: Jared Dopp A. Date of Service: 05/19/2019 2:15 PM Medical Record Number: [id number] Patient Account Number: [id number] Date of Birth/Sex: August 08, 1932 (86 y.o. M) Treating RN: Army Melia Primary Care Senovia Gauer: Cranford Mon, Delfino Lovett Other Clinician: Referring Vandell Kun: Cranford Mon, RICHARD Treating Linna Thebeau/Extender: Tito Dine in Treatment: 0 Vital Signs Time Taken: 14:26 Temperature (F): 97.3 Height (in): 73 Pulse (bpm): 76 Source: Stated Respiratory Rate (breaths/min): 16 Weight (lbs): 260 Blood Pressure (mmHg): 123/73 Source: Stated Reference Range: 80 - 120 mg / dl Body Mass Index (BMI): 34.3 Electronic Signature(s) Signed: 05/19/2019 4:11:28 PM By: Army Melia Entered By: Army Melia on 05/19/2019 14:27:42
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
## 2019-06-03 NOTE — Progress Notes (Signed)
SESAR, BOSSIO ([id number]) Visit Report for 06/02/2019 Arrival Information Details Patient Name: Jared Rice, Jared Rice. Date of Service: 06/02/2019 2:45 PM Medical Record Number: [id number] Patient Account Number: [id number] Date of Birth/Sex: Sep 06, 1932 (86 y.o. M) Treating RN: Cornell Barman Primary Care Brigg Cape: Cranford Mon, Delfino Lovett Other Clinician: Referring Jazleen Robeck: Cranford Mon, RICHARD Treating Khalel Alms/Extender: Tito Dine in Treatment: 2 Visit Information History Since Last Visit Added or deleted any medications: No Patient Arrived: Ambulatory Any new allergies or adverse reactions: No Arrival Time: 15:00 Had a fall or experienced change in No Accompanied By: self activities of daily living that may affect Transfer Assistance: None risk of falls: Patient Identification Verified: Yes Signs or symptoms of abuse/neglect since last visito No Secondary Verification Process Completed: Yes Hospitalized since last visit: No Implantable device outside of the clinic excluding No cellular tissue based products placed in the center since last visit: Has Dressing in Place as Prescribed: No Pain Present Now: No Electronic Signature(s) Signed: 06/02/2019 3:30:55 PM By: Lorine Bears RCP, RRT, CHT Entered By: Lorine Bears on 06/02/2019 15:01:36 Eades, [name] -------------------------------------------------------------------------------- Clinic Level of Care Assessment Details Patient Name: Jared Rice, Jared A. Date of Service: 06/02/2019 2:45 PM Medical Record Number: [id number] Patient Account Number: [id number] Date of Birth/Sex: 09-06-1932 (86 y.o. M) Treating RN: Cornell Barman Primary Care Ferlando Lia: Cranford Mon, Delfino Lovett Other Clinician: Referring Otie Headlee: Cranford Mon, RICHARD Treating Carmaleta Youngers/Extender: Tito Dine in Treatment: 2 Clinic Level of Care Assessment Items TOOL 4 Quantity Score []  - Use when only an EandM is  performed on FOLLOW-UP visit 0 ASSESSMENTS - Nursing Assessment / Reassessment X - Reassessment of Co-morbidities (includes updates in patient status) 1 10 X- 1 5 Reassessment of Adherence to Treatment Plan ASSESSMENTS - Wound and Skin Assessment / Reassessment X - Simple Wound Assessment / Reassessment - one wound 1 5 []  - 0 Complex Wound Assessment / Reassessment - multiple wounds []  - 0 Dermatologic / Skin Assessment (not related to wound area) ASSESSMENTS - Focused Assessment []  - Circumferential Edema Measurements - multi extremities 0 []  - 0 Nutritional Assessment / Counseling / Intervention []  - 0 Lower Extremity Assessment (monofilament, tuning fork, pulses) []  - 0 Peripheral Arterial Disease Assessment (using hand held doppler) ASSESSMENTS - Ostomy and/or Continence Assessment and Care []  - Incontinence Assessment and Management 0 []  - 0 Ostomy Care Assessment and Management (repouching, etc.) PROCESS - Coordination of Care X - Simple Patient / Family Education for ongoing care 1 15 []  - 0 Complex (extensive) Patient / Family Education for ongoing care []  - 0 Staff obtains Programmer, systems, Records, Test Results / Process Orders []  - 0 Staff telephones HHA, Nursing Homes / Clarify orders / etc []  - 0 Routine Transfer to another Facility (non-emergent condition) []  - 0 Routine Hospital Admission (non-emergent condition) []  - 0 New Admissions / Biomedical engineer / Ordering NPWT, Apligraf, etc. []  - 0 Emergency Hospital Admission (emergent condition) X- 1 10 Simple Discharge Coordination Jared Rice, Jared A. ([id number]) []  - 0 Complex (extensive) Discharge Coordination PROCESS - Special Needs []  - Pediatric / Minor Patient Management 0 []  - 0 Isolation Patient Management []  - 0 Hearing / Language / Visual special needs []  - 0 Assessment of Community assistance (transportation, D/C planning, etc.) []  - 0 Additional assistance / Altered mentation []  - 0 Support  Surface(s) Assessment (bed, cushion, seat, etc.) INTERVENTIONS - Wound Cleansing / Measurement X - Simple Wound Cleansing - one wound 1 5 []  - 0 Complex Wound  Cleansing - multiple wounds X- 1 5 Wound Imaging (photographs - any number of wounds) []  - 0 Wound Tracing (instead of photographs) X- 1 5 Simple Wound Measurement - one wound []  - 0 Complex Wound Measurement - multiple wounds INTERVENTIONS - Wound Dressings []  - Small Wound Dressing one or multiple wounds 0 X- 1 15 Medium Wound Dressing one or multiple wounds []  - 0 Large Wound Dressing one or multiple wounds []  - 0 Application of Medications - topical []  - 0 Application of Medications - injection INTERVENTIONS - Miscellaneous []  - External ear exam 0 []  - 0 Specimen Collection (cultures, biopsies, blood, body fluids, etc.) []  - 0 Specimen(s) / Culture(s) sent or taken to Lab for analysis []  - 0 Patient Transfer (multiple staff / Civil Service fast streamer / Similar devices) []  - 0 Simple Staple / Suture removal (25 or less) []  - 0 Complex Staple / Suture removal (26 or more) []  - 0 Hypo / Hyperglycemic Management (close monitor of Blood Glucose) []  - 0 Ankle / Brachial Index (ABI) - do not check if billed separately X- 1 5 Vital Signs Jared Rice, Jared A. ([id number]) Has the patient been seen at the hospital within the last three years: Yes Total Score: 80 Level Of Care: New/Established - Level 3 Electronic Signature(s) Signed: 06/02/2019 4:51:02 PM By: Gretta Cool, BSN, RN, CWS, Kim RN, BSN Entered By: Gretta Cool, BSN, RN, CWS, Kim on 06/02/2019 15:33:00 [name] -------------------------------------------------------------------------------- Encounter Discharge Information Details Patient Name: Jared Dopp A. Date of Service: 06/02/2019 2:45 PM Medical Record Number: [id number] Patient Account Number: [id number] Date of Birth/Sex: 08-Nov-1932 (86 y.o. M) Treating RN: Cornell Barman Primary Care Rembert Browe: Cranford Mon,  Delfino Lovett Other Clinician: Referring Eleah Lahaie: Cranford Mon, RICHARD Treating Taishaun Levels/Extender: Tito Dine in Treatment: 2 Encounter Discharge Information Items Discharge Condition: Stable Ambulatory Status: Ambulatory Discharge Destination: Home Transportation: Private Auto Accompanied By: self Schedule Follow-up Appointment: Yes Clinical Summary of Care: Electronic Signature(s) Signed: 06/02/2019 4:51:02 PM By: Gretta Cool, BSN, RN, CWS, Kim RN, BSN Entered By: Gretta Cool, BSN, RN, CWS, Kim on 06/02/2019 15:33:52 [name] -------------------------------------------------------------------------------- Lower Extremity Assessment Details Patient Name: Jared Rice, Jared A. Date of Service: 06/02/2019 2:45 PM Medical Record Number: [id number] Patient Account Number: [id number] Date of Birth/Sex: 1932-07-18 (86 y.o. M) Treating RN: Army Melia Primary Care Glynnis Gavel: Wilhemena Durie Other Clinician: Referring Jeannine Pennisi: Cranford Mon, RICHARD Treating Lorelie Biermann/Extender: Ricard Dillon Weeks in Treatment: 2 Edema Assessment Assessed: [Left: No] [Right: No] Edema: [Left: N] [Right: o] Vascular Assessment Pulses: Dorsalis Pedis Palpable: [Right:Yes] Electronic Signature(s) Signed: 06/03/2019 11:26:15 AM By: Army Melia Entered By: Army Melia on 06/02/2019 15:15:18 Jared Rice, [name] -------------------------------------------------------------------------------- Multi Wound Chart Details Patient Name: Jared Dopp A. Date of Service: 06/02/2019 2:45 PM Medical Record Number: [id number] Patient Account Number: [id number] Date of Birth/Sex: 1933-01-28 (86 y.o. M) Treating RN: Cornell Barman Primary Care Glennis Montenegro: Cranford Mon, Delfino Lovett Other Clinician: Referring Rakesha Dalporto: Cranford Mon, RICHARD Treating Grady Mohabir/Extender: Tito Dine in Treatment: 2 Vital Signs Height(in): 73 Pulse(bpm): 73 Weight(lbs): 260 Blood Pressure(mmHg): 125/66 Body Mass  Index(BMI): 34 Temperature(F): 98.2 Respiratory Rate 16 (breaths/min): Photos: [N/A:N/A] Wound Location: Right Lower Leg - Anterior N/A N/A Wounding Event: Trauma N/A N/A Primary Etiology: Trauma, Other N/A N/A Comorbid History: Cataracts N/A N/A Date Acquired: 02/24/2019 N/A N/A Weeks of Treatment: 2 N/A N/A Wound Status: Open N/A N/A Measurements L x W x D 0.1x0.1x0.1 N/A N/A (cm) Area (cm) : 0.008 N/A N/A Volume (cm) : 0.001 N/A N/A %  Reduction in Area: 99.10% N/A N/A % Reduction in Volume: 99.40% N/A N/A Classification: Partial Thickness N/A N/A Exudate Amount: None Present N/A N/A Granulation Amount: None Present (0%) N/A N/A Necrotic Amount: None Present (0%) N/A N/A Exposed Structures: Fascia: No N/A N/A Fat Layer (Subcutaneous Tissue) Exposed: No Tendon: No Muscle: No Joint: No Bone: No Epithelialization: Large (67-100%) N/A N/A Treatment Notes Wound #1 (Right, Anterior Lower Leg) Jared Rice, [name] Notes Silver collagen, Moisten with Hydragel, BFD Electronic Signature(s) Signed: 06/02/2019 4:45:14 PM By: Linton Ham MD Entered By: Linton Ham on 06/02/2019 16:40:56 Jared Rice, [name] -------------------------------------------------------------------------------- Multi-Disciplinary Care Plan Details Patient Name: Jared Dopp A. Date of Service: 06/02/2019 2:45 PM Medical Record Number: [id number] Patient Account Number: [id number] Date of Birth/Sex: 1932/07/26 (86 y.o. M) Treating RN: Cornell Barman Primary Care Wilian Kwong: Cranford Mon, Delfino Lovett Other Clinician: Referring Maretta Overdorf: Cranford Mon, RICHARD Treating Rosser Collington/Extender: Tito Dine in Treatment: 2 Active Inactive Necrotic Tissue Nursing Diagnoses: Impaired tissue integrity related to necrotic/devitalized tissue Goals: Necrotic/devitalized tissue will be minimized in the wound bed Date Initiated: 05/19/2019 Target Resolution Date: 05/26/2019 Goal Status:  Active Interventions: Assess patient pain level pre-, during and post procedure and prior to discharge Treatment Activities: Excisional debridement : 05/19/2019 Notes: Orientation to the Wound Care Program Nursing Diagnoses: Knowledge deficit related to the wound healing center program Goals: Patient/caregiver will verbalize understanding of the [place] Program Date Initiated: 05/19/2019 Target Resolution Date: 05/26/2019 Goal Status: Active Interventions: Provide education on orientation to the wound center Notes: Wound/Skin Impairment Nursing Diagnoses: Impaired tissue integrity Goals: Patient/caregiver will verbalize understanding of skin care regimen Date Initiated: 05/19/2019 Target Resolution Date: 05/26/2019 Goal Status: Active Jared Rice, [name] Interventions: Assess patient/caregiver ability to obtain necessary supplies Assess ulceration(s) every visit Treatment Activities: Skin care regimen initiated : 05/19/2019 Notes: Electronic Signature(s) Signed: 06/02/2019 4:51:02 PM By: Gretta Cool, BSN, RN, CWS, Kim RN, BSN Entered By: Gretta Cool, BSN, RN, CWS, Kim on 06/02/2019 15:32:04 [name] -------------------------------------------------------------------------------- Pain Assessment Details Patient Name: Jared Dopp A. Date of Service: 06/02/2019 2:45 PM Medical Record Number: [id number] Patient Account Number: [id number] Date of Birth/Sex: [birth year] (86 y.o. M) Treating RN: Cornell Barman Primary Care Phoenicia Pirie: Cranford Mon, Delfino Lovett Other Clinician: Referring Branson Kranz: Cranford Mon, RICHARD Treating Nikoleta Dady/Extender: Tito Dine in Treatment: 2 Active Problems Location of Pain Severity and Description of Pain Patient Has Paino No Site Locations Pain Management and Medication Current Pain Management: Electronic Signature(s) Signed: 06/02/2019 3:30:55 PM By: Lorine Bears RCP, RRT, CHT Signed: 06/02/2019  4:51:02 PM By: Gretta Cool, BSN, RN, CWS, Kim RN, BSN Entered By: Lorine Bears on 06/02/2019 15:01:47 [name] -------------------------------------------------------------------------------- Patient/Caregiver Education Details Patient Name: Jared Dopp A. Date of Service: 06/02/2019 2:45 PM Medical Record Number: [id number] Patient Account Number: [id number] Date of Birth/Gender: 1932-11-01 (86 y.o. M) Treating RN: Cornell Barman Primary Care Physician: Cranford Mon, Delfino Lovett Other Clinician: Referring Physician: Wilhemena Durie Treating Physician/Extender: Tito Dine in Treatment: 2 Education Assessment Education Provided To: Patient Education Topics Provided Welcome To [facility]: Handouts: Welcome To [facility] Methods: Demonstration Responses: State content correctly Wound/Skin Impairment: Handouts: Caring for Your Ulcer Methods: Demonstration Responses: State content correctly Electronic Signature(s) Signed: 06/02/2019 4:51:02 PM By: Gretta Cool, BSN, RN, CWS, Kim RN, BSN Entered By: Gretta Cool, BSN, RN, CWS, Kim on 06/02/2019 15:33:19 [name] -------------------------------------------------------------------------------- Wound Assessment Details Patient Name: Jared Rice, Jared A. Date of Service: 06/02/2019 2:45 PM Medical  Record Number: [id number] Patient Account Number: [id number] Date of Birth/Sex: [birth year] (86 y.o. M) Treating RN: Army Melia Primary Care Kendahl Bumgardner: Cranford Mon, Delfino Lovett Other Clinician: Referring Shakeira Rhee: Cranford Mon, RICHARD Treating Dalissa Lovin/Extender: Tito Dine in Treatment: 2 Wound Status Wound Number: 1 Primary Etiology: Trauma, Other Wound Location: Right Lower Leg - Anterior Wound Status: Open Wounding Event: Trauma Comorbid History: Cataracts Date Acquired: 02/24/2019 Weeks Of Treatment: 2 Clustered Wound: No Photos Wound Measurements Length: (cm)  0.1 Width: (cm) 0.1 Depth: (cm) 0.1 Area: (cm) 0.008 Volume: (cm) 0.001 % Reduction in Area: 99.1% % Reduction in Volume: 99.4% Epithelialization: Large (67-100%) Wound Description Classification: Partial Thickness Foul Odor Aft Exudate Amount: None Present Slough/Fibrin er Cleansing: No o Yes Wound Bed Granulation Amount: None Present (0%) Exposed Structure Necrotic Amount: None Present (0%) Fascia Exposed: No Fat Layer (Subcutaneous Tissue) Exposed: No Tendon Exposed: No Muscle Exposed: No Joint Exposed: No Bone Exposed: No Treatment Notes Wound #1 (Right, Anterior Lower Leg) Notes [place], Deronte A. ([id number]) Silver collagen, Moisten with Hydragel, BFD Electronic Signature(s) Signed: 06/03/2019 11:26:15 AM By: Army Melia Entered By: Army Melia on 06/02/2019 15:15:05 Jared Rice ([id number]) -------------------------------------------------------------------------------- Vitals Details Patient Name: Jared Dopp A. Date of Service: 06/02/2019 2:45 PM Medical Record Number: [id number] Patient Account Number: [id number] Date of Birth/Sex: 1932/08/03 (86 y.o. M) Treating RN: Cornell Barman Primary Care Newell Frater: Cranford Mon, Delfino Lovett Other Clinician: Referring Jaciel Diem: Cranford Mon, RICHARD Treating Kayhan Boardley/Extender: Tito Dine in Treatment: 2 Vital Signs Time Taken: 15:00 Temperature (F): 98.2 Height (in): 73 Pulse (bpm): 73 Weight (lbs): 260 Respiratory Rate (breaths/min): 16 Body Mass Index (BMI): 34.3 Blood Pressure (mmHg): 125/66 Reference Range: 80 - 120 mg / dl Electronic Signature(s) Signed: 06/02/2019 3:30:55 PM By: Lorine Bears RCP, RRT, CHT Entered By: Lorine Bears on 06/02/2019 15:03:19

## 2019-06-09 ENCOUNTER — Ambulatory Visit: Payer: Self-pay | Admitting: Family Medicine

## 2019-06-09 ENCOUNTER — Encounter: Payer: PPO | Admitting: Internal Medicine

## 2019-06-09 ENCOUNTER — Other Ambulatory Visit: Payer: Self-pay

## 2019-06-09 DIAGNOSIS — E11622 Type 2 diabetes mellitus with other skin ulcer: Secondary | ICD-10-CM | POA: Diagnosis not present

## 2019-06-09 DIAGNOSIS — I872 Venous insufficiency (chronic) (peripheral): Secondary | ICD-10-CM | POA: Diagnosis not present

## 2019-06-09 DIAGNOSIS — S81801A Unspecified open wound, right lower leg, initial encounter: Secondary | ICD-10-CM | POA: Diagnosis not present
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
## 2019-06-09 NOTE — Progress Notes (Signed)
YSIDRO, ACHENBACH ([id number]) Visit Report for 06/09/2019 Arrival Information Details Patient Name: Jared Rice, Jared Rice. Date of Service: 06/09/2019 2:00 PM Medical Record Number: [id number] Patient Account Number: [id number] Date of Birth/Sex: 05/15/1933 (86 y.o. M) Treating RN: Cornell Barman Primary Care Philomena Buttermore: Cranford Mon, Delfino Lovett Other Clinician: Referring Constantin Hillery: Cranford Mon, RICHARD Treating Iyonnah Ferrante/Extender: Tito Dine in Treatment: 3 Visit Information History Since Last Visit Added or deleted any medications: No Patient Arrived: Cane Any new allergies or adverse reactions: No Arrival Time: 14:03 Had a fall or experienced change in No Accompanied By: self activities of daily living that may affect Transfer Assistance: None risk of falls: Patient Identification Verified: Yes Signs or symptoms of abuse/neglect since last visito No Secondary Verification Process Completed: Yes Hospitalized since last visit: No Implantable device outside of the clinic excluding No cellular tissue based products placed in the center since last visit: Has Dressing in Place as Prescribed: Yes Pain Present Now: No Electronic Signature(s) Signed: 06/09/2019 4:11:36 PM By: Lorine Bears RCP, RRT, CHT Entered By: Lorine Bears on 06/09/2019 14:04:24 [name] ([id number]) -------------------------------------------------------------------------------- Clinic Level of Care Assessment Details Patient Name: [name], [name] Date of Service: 06/09/2019 2:00 PM Medical Record Number: [id number] Patient Account Number: [id number] Date of Birth/Sex: 06/22/1932 (86 y.o. M) Treating RN: Cornell Barman Primary Care Maxcine Strong: Cranford Mon, Delfino Lovett Other Clinician: Referring Trafton Roker: Cranford Mon, RICHARD Treating Tadashi Burkel/Extender: Tito Dine in Treatment: 3 Clinic Level of Care Assessment Items TOOL 4 Quantity Score []  - Use when only an EandM is  performed on FOLLOW-UP visit 0 ASSESSMENTS - Nursing Assessment / Reassessment X - Reassessment of Co-morbidities (includes updates in patient status) 1 10 X- 1 5 Reassessment of Adherence to Treatment Plan ASSESSMENTS - Wound and Skin Assessment / Reassessment X - Simple Wound Assessment / Reassessment - one wound 1 5 []  - 0 Complex Wound Assessment / Reassessment - multiple wounds []  - 0 Dermatologic / Skin Assessment (not related to wound area) ASSESSMENTS - Focused Assessment []  - Circumferential Edema Measurements - multi extremities 0 []  - 0 Nutritional Assessment / Counseling / Intervention []  - 0 Lower Extremity Assessment (monofilament, tuning fork, pulses) []  - 0 Peripheral Arterial Disease Assessment (using hand held doppler) ASSESSMENTS - Ostomy and/or Continence Assessment and Care []  - Incontinence Assessment and Management 0 []  - 0 Ostomy Care Assessment and Management (repouching, etc.) PROCESS - Coordination of Care X - Simple Patient / Family Education for ongoing care 1 15 []  - 0 Complex (extensive) Patient / Family Education for ongoing care []  - 0 Staff obtains Programmer, systems, Records, Test Results / Process Orders []  - 0 Staff telephones HHA, Nursing Homes / Clarify orders / etc []  - 0 Routine Transfer to another Facility (non-emergent condition) []  - 0 Routine Hospital Admission (non-emergent condition) []  - 0 New Admissions / Biomedical engineer / Ordering NPWT, Apligraf, etc. []  - 0 Emergency Hospital Admission (emergent condition) X- 1 10 Simple Discharge Coordination JAHSIAH, SIEGLER A. ([id number]) []  - 0 Complex (extensive) Discharge Coordination PROCESS - Special Needs []  - Pediatric / Minor Patient Management 0 []  - 0 Isolation Patient Management []  - 0 Hearing / Language / Visual special needs []  - 0 Assessment of Community assistance (transportation, D/C planning, etc.) []  - 0 Additional assistance / Altered mentation []  - 0 Support  Surface(s) Assessment (bed, cushion, seat, etc.) INTERVENTIONS - Wound Cleansing / Measurement X - Simple Wound Cleansing - one wound 1 5 []  - 0 Complex Wound  Cleansing - multiple wounds X- 1 5 Wound Imaging (photographs - any number of wounds) []  - 0 Wound Tracing (instead of photographs) X- 1 5 Simple Wound Measurement - one wound []  - 0 Complex Wound Measurement - multiple wounds INTERVENTIONS - Wound Dressings []  - Small Wound Dressing one or multiple wounds 0 []  - 0 Medium Wound Dressing one or multiple wounds []  - 0 Large Wound Dressing one or multiple wounds []  - 0 Application of Medications - topical []  - 0 Application of Medications - injection INTERVENTIONS - Miscellaneous []  - External ear exam 0 []  - 0 Specimen Collection (cultures, biopsies, blood, body fluids, etc.) []  - 0 Specimen(s) / Culture(s) sent or taken to Lab for analysis []  - 0 Patient Transfer (multiple staff / Civil Service fast streamer / Similar devices) []  - 0 Simple Staple / Suture removal (25 or less) []  - 0 Complex Staple / Suture removal (26 or more) []  - 0 Hypo / Hyperglycemic Management (close monitor of Blood Glucose) []  - 0 Ankle / Brachial Index (ABI) - do not check if billed separately X- 1 5 Vital Signs Boeder, Lisa A. ([id number]) Has the patient been seen at the hospital within the last three years: Yes Total Score: 65 Level Of Care: New/Established - Level 2 Electronic Signature(s) Signed: 06/09/2019 4:52:26 PM By: Gretta Cool, BSN, RN, CWS, Kim RN, BSN Entered By: Gretta Cool, BSN, RN, CWS, Kim on 06/09/2019 14:34:14 [name] ([id number]) -------------------------------------------------------------------------------- Encounter Discharge Information Details Patient Name: Jared Rice A. Date of Service: 06/09/2019 2:00 PM Medical Record Number: [id number] Patient Account Number: [id number] Date of Birth/Sex: [birth year] (86 y.o. M) Treating RN: Cornell Barman Primary Care Pascual Mantel: Cranford Mon,  Delfino Lovett Other Clinician: Referring Oyuki Hogan: Cranford Mon, RICHARD Treating Nyana Haren/Extender: Tito Dine in Treatment: 3 Encounter Discharge Information Items Discharge Condition: Stable Ambulatory Status: Cane Discharge Destination: Home Transportation: Private Auto Accompanied By: self Schedule Follow-up Appointment: Yes Clinical Summary of Care: Electronic Signature(s) Signed: 06/09/2019 4:52:26 PM By: Gretta Cool, BSN, RN, CWS, Kim RN, BSN Entered By: Gretta Cool, BSN, RN, CWS, Kim on 06/09/2019 14:37:50 [name] ([id number]) -------------------------------------------------------------------------------- Lower Extremity Assessment Details Patient Name: JAICOB, VANWAGNER A. Date of Service: 06/09/2019 2:00 PM Medical Record Number: [id number] Patient Account Number: [id number] Date of Birth/Sex: Sep 08, 1932 (86 y.o. M) Treating RN: Montey Hora Primary Care Camdin Hegner: Cranford Mon, Delfino Lovett Other Clinician: Referring Khadim Lundberg: Cranford Mon, RICHARD Treating Thailyn Khalid/Extender: Ricard Dillon Weeks in Treatment: 3 Edema Assessment Assessed: [Left: No] [Right: No] Edema: [Left: Ye] [Right: s] Electronic Signature(s) Signed: 06/09/2019 2:08:56 PM By: Montey Hora Entered By: Montey Hora on 06/09/2019 14:08:55 Fandrich, Gerda Diss ([id number]) -------------------------------------------------------------------------------- Multi Wound Chart Details Patient Name: Jared Rice A. Date of Service: 06/09/2019 2:00 PM Medical Record Number: [id number] Patient Account Number: [id number] Date of Birth/Sex: 1933-04-20 (86 y.o. M) Treating RN: Cornell Barman Primary Care Andrew Blasius: Cranford Mon, Delfino Lovett Other Clinician: Referring Issabelle Mcraney: Cranford Mon, RICHARD Treating Flo Berroa/Extender: Tito Dine in Treatment: 3 Vital Signs Height(in): 73 Pulse(bpm): 91 Weight(lbs): 260 Blood Pressure(mmHg): 141/86 Body Mass Index(BMI): 34 Temperature(F): 98.8 Respiratory  Rate 16 (breaths/min): Photos: [N/A:N/A] Wound Location: Right Lower Leg - Anterior N/A N/A Wounding Event: Trauma N/A N/A Primary Etiology: Trauma, Other N/A N/A Comorbid History: Cataracts N/A N/A Date Acquired: 02/24/2019 N/A N/A Weeks of Treatment: 3 N/A N/A Wound Status: Open N/A N/A Measurements L x W x D 0x0x0 N/A N/A (cm) Area (cm) : 0 N/A N/A Volume (cm) : 0 N/A N/A % Reduction in Area: 100.00% N/A N/A %  Reduction in Volume: 100.00% N/A N/A Classification: Partial Thickness N/A N/A Exudate Amount: None Present N/A N/A Granulation Amount: None Present (0%) N/A N/A Necrotic Amount: None Present (0%) N/A N/A Exposed Structures: Fascia: No N/A N/A Fat Layer (Subcutaneous Tissue) Exposed: No Tendon: No Muscle: No Joint: No Bone: No Limited to Skin Breakdown Epithelialization: Large (67-100%) N/A N/A Treatment Notes NAIN, BRASINGTON ([id number]) Electronic Signature(s) Signed: 06/09/2019 4:15:01 PM By: Linton Ham MD Entered By: Linton Ham on 06/09/2019 14:37:57 Neale, Gerda Diss ([id number]) -------------------------------------------------------------------------------- Multi-Disciplinary Care Plan Details Patient Name: Jared Rice A. Date of Service: 06/09/2019 2:00 PM Medical Record Number: [id number] Patient Account Number: [id number] Date of Birth/Sex: 01-Nov-1932 (86 y.o. M) Treating RN: Cornell Barman Primary Care Eryka Dolinger: Cranford Mon, Delfino Lovett Other Clinician: Referring Amber Guthridge: Cranford Mon, RICHARD Treating Lucresha Dismuke/Extender: Tito Dine in Treatment: 3 Active Inactive Electronic Signature(s) Signed: 06/09/2019 4:52:26 PM By: Gretta Cool, BSN, RN, CWS, Kim RN, BSN Entered By: Gretta Cool, BSN, RN, CWS, Kim on 06/09/2019 14:32:44 [name] ([id number]) -------------------------------------------------------------------------------- Pain Assessment Details Patient Name: Jared Rice A. Date of Service: 06/09/2019 2:00 PM Medical Record  Number: [id number] Patient Account Number: [id number] Date of Birth/Sex: Jun 03, 1932 (86 y.o. M) Treating RN: Cornell Barman Primary Care Artin Mceuen: Cranford Mon, Delfino Lovett Other Clinician: Referring Denijah Karrer: Cranford Mon, RICHARD Treating Akili Cuda/Extender: Tito Dine in Treatment: 3 Active Problems Location of Pain Severity and Description of Pain Patient Has Paino No Site Locations Pain Management and Medication Current Pain Management: Electronic Signature(s) Signed: 06/09/2019 4:11:36 PM By: Lorine Bears RCP, RRT, CHT Signed: 06/09/2019 4:52:26 PM By: Gretta Cool, BSN, RN, CWS, Kim RN, BSN Entered By: Lorine Bears on 06/09/2019 14:06:57 [name] ([id number]) -------------------------------------------------------------------------------- Wound Assessment Details Patient Name: Jared Rice A. Date of Service: 06/09/2019 2:00 PM Medical Record Number: [id number] Patient Account Number: [id number] Date of Birth/Sex: [birth year] (86 y.o. M) Treating RN: Montey Hora Primary Care Albertus Chiarelli: Cranford Mon, RICHARD Other Clinician: Referring Anshul Meddings: Cranford Mon, RICHARD Treating Mouhamed Glassco/Extender: Tito Dine in Treatment: 3 Wound Status Wound Number: 1 Primary Etiology: Trauma, Other Wound Location: Right Lower Leg - Anterior Wound Status: Open Wounding Event: Trauma Comorbid History: Cataracts Date Acquired: 02/24/2019 Weeks Of Treatment: 3 Clustered Wound: No Photos Wound Measurements Length: (cm) 0 % Reducti Width: (cm) 0 % Reducti Depth: (cm) 0 Epithelia Area: (cm) 0 Tunnelin Volume: (cm) 0 Undermin on in Area: 100% on in Volume: 100% lization: Large (67-100%) g: No ing: No Wound Description Classification: Partial Thickness Exudate Amount: None Present Foul Odor After Cleansing: No Slough/Fibrino No Wound Bed Granulation Amount: None Present (0%) Exposed Structure Necrotic Amount: None Present (0%) Fascia  Exposed: No Fat Layer (Subcutaneous Tissue) Exposed: No Tendon Exposed: No Muscle Exposed: No Joint Exposed: No Bone Exposed: No Limited to Skin Breakdown Electronic Signature(s) Signed: 06/09/2019 2:08:41 PM By: Seyon Ivanoff, Gerda Diss ([id number]) Entered By: Montey Hora on 06/09/2019 14:08:41 [name] ([id number]) -------------------------------------------------------------------------------- Richards Details Patient Name: Jared Rice A. Date of Service: 06/09/2019 2:00 PM Medical Record Number: [id number] Patient Account Number: [id number] Date of Birth/Sex: [birth year] (86 y.o. M) Treating RN: Cornell Barman Primary Care Derricka Mertz: Cranford Mon, Delfino Lovett Other Clinician: Referring Zylee Marchiano: Cranford Mon, RICHARD Treating Lacrecia Delval/Extender: Tito Dine in Treatment: 3 Vital Signs Time Taken: 14:06 Temperature (F): 98.8 Height (in): 73 Pulse (bpm): 91 Weight (lbs): 260 Respiratory Rate (breaths/min): 16 Body Mass Index (BMI): 34.3 Blood Pressure (mmHg): 141/86 Reference Range: 80 - 120 mg / dl Electronic Signature(s) Signed: 06/09/2019 4:11:36  PM By: Lorine Bears RCP, RRT, CHT Entered By: Lorine Bears on 06/09/2019 14:07:19

## 2019-06-16 ENCOUNTER — Ambulatory Visit: Payer: Self-pay | Admitting: Family Medicine

## 2019-06-16 ENCOUNTER — Ambulatory Visit: Payer: PPO | Admitting: Internal Medicine
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
## 2019-06-22 ENCOUNTER — Ambulatory Visit (INDEPENDENT_AMBULATORY_CARE_PROVIDER_SITE_OTHER): Payer: PPO | Admitting: Family Medicine

## 2019-06-22 ENCOUNTER — Other Ambulatory Visit: Payer: Self-pay

## 2019-06-22 ENCOUNTER — Encounter: Payer: Self-pay | Admitting: Family Medicine

## 2019-06-22 VITALS — BP 142/81 | HR 69 | Temp 97.1°F | Ht 73.0 in | Wt 277.0 lb

## 2019-06-22 DIAGNOSIS — I872 Venous insufficiency (chronic) (peripheral): Secondary | ICD-10-CM | POA: Diagnosis not present

## 2019-06-22 DIAGNOSIS — R7303 Prediabetes: Secondary | ICD-10-CM | POA: Diagnosis not present

## 2019-06-22 DIAGNOSIS — E785 Hyperlipidemia, unspecified: Secondary | ICD-10-CM | POA: Diagnosis not present

## 2019-06-22 DIAGNOSIS — I10 Essential (primary) hypertension: Secondary | ICD-10-CM | POA: Diagnosis not present

## 2019-06-22 DIAGNOSIS — I25118 Atherosclerotic heart disease of native coronary artery with other forms of angina pectoris: Secondary | ICD-10-CM

## 2019-06-22 DIAGNOSIS — Z0000 Encounter for general adult medical examination without abnormal findings: Secondary | ICD-10-CM | POA: Diagnosis not present

## 2019-06-22 DIAGNOSIS — I4891 Unspecified atrial fibrillation: Secondary | ICD-10-CM

## 2019-06-22 DIAGNOSIS — Z6836 Body mass index (BMI) 36.0-36.9, adult: Secondary | ICD-10-CM

## 2019-06-25 DIAGNOSIS — I25118 Atherosclerotic heart disease of native coronary artery with other forms of angina pectoris: Secondary | ICD-10-CM | POA: Diagnosis not present

## 2019-06-25 DIAGNOSIS — I10 Essential (primary) hypertension: Secondary | ICD-10-CM | POA: Diagnosis not present

## 2019-06-25 DIAGNOSIS — R7303 Prediabetes: Secondary | ICD-10-CM | POA: Diagnosis not present

## 2019-06-25 DIAGNOSIS — Z0000 Encounter for general adult medical examination without abnormal findings: Secondary | ICD-10-CM | POA: Diagnosis not present

## 2019-06-25 DIAGNOSIS — E785 Hyperlipidemia, unspecified: Secondary | ICD-10-CM | POA: Diagnosis not present

## 2019-06-25 DIAGNOSIS — Z6836 Body mass index (BMI) 36.0-36.9, adult: Secondary | ICD-10-CM | POA: Diagnosis not present

## 2019-06-26 LAB — CBC WITH DIFFERENTIAL/PLATELET
Basophils Absolute: 0 10*3/uL (ref 0.0–0.2)
Basos: 0 %
EOS (ABSOLUTE): 0.2 10*3/uL (ref 0.0–0.4)
Eos: 3 %
Hematocrit: 36.7 % — ABNORMAL LOW (ref 37.5–51.0)
Hemoglobin: 12 g/dL — ABNORMAL LOW (ref 13.0–17.7)
Immature Grans (Abs): 0 10*3/uL (ref 0.0–0.1)
Immature Granulocytes: 0 %
Lymphocytes Absolute: 1 10*3/uL (ref 0.7–3.1)
Lymphs: 18 %
MCH: 30.5 pg (ref 26.6–33.0)
MCHC: 32.7 g/dL (ref 31.5–35.7)
MCV: 93 fL (ref 79–97)
Monocytes Absolute: 0.5 10*3/uL (ref 0.1–0.9)
Monocytes: 9 %
Neutrophils Absolute: 4.1 10*3/uL (ref 1.4–7.0)
Neutrophils: 70 %
Platelets: 155 10*3/uL (ref 150–450)
RBC: 3.94 x10E6/uL — ABNORMAL LOW (ref 4.14–5.80)
RDW: 13.7 % (ref 11.6–15.4)
WBC: 5.8 10*3/uL (ref 3.4–10.8)

## 2019-06-26 LAB — COMPREHENSIVE METABOLIC PANEL
ALT: 6 IU/L (ref 0–44)
AST: 9 IU/L (ref 0–40)
Albumin/Globulin Ratio: 2.1 (ref 1.2–2.2)
Albumin: 3.8 g/dL (ref 3.6–4.6)
Alkaline Phosphatase: 65 IU/L (ref 39–117)
BUN/Creatinine Ratio: 16 (ref 10–24)
BUN: 15 mg/dL (ref 8–27)
Bilirubin Total: 0.3 mg/dL (ref 0.0–1.2)
CO2: 28 mmol/L (ref 20–29)
Calcium: 8.7 mg/dL (ref 8.6–10.2)
Chloride: 108 mmol/L — ABNORMAL HIGH (ref 96–106)
Creatinine, Ser: 0.92 mg/dL (ref 0.76–1.27)
GFR calc Af Amer: 88 mL/min/{1.73_m2} (ref >59)
GFR calc non Af Amer: 76 mL/min/{1.73_m2} (ref >59)
Globulin, Total: 1.8 g/dL (ref 1.5–4.5)
Glucose: 100 mg/dL — ABNORMAL HIGH (ref 65–99)
Potassium: 4.3 mmol/L (ref 3.5–5.2)
Sodium: 143 mmol/L (ref 134–144)
Total Protein: 5.6 g/dL — ABNORMAL LOW (ref 6.0–8.5)

## 2019-06-26 LAB — TSH: TSH: 4.13 u[IU]/mL (ref 0.450–4.500)

## 2019-06-26 LAB — HEMOGLOBIN A1C
Est. average glucose Bld gHb Est-mCnc: 134 mg/dL
Hgb A1c MFr Bld: 6.3 % — ABNORMAL HIGH (ref 4.8–5.6)

## 2019-06-26 LAB — LIPID PANEL
Chol/HDL Ratio: 2.5 ratio (ref 0.0–5.0)
Cholesterol, Total: 104 mg/dL (ref 100–199)
HDL: 42 mg/dL (ref >39)
LDL Chol Calc (NIH): 51 mg/dL (ref 0–99)
Triglycerides: 45 mg/dL (ref 0–149)
VLDL Cholesterol Cal: 11 mg/dL (ref 5–40)

## 2019-06-30 ENCOUNTER — Telehealth: Payer: Self-pay

## 2019-06-30 DIAGNOSIS — B351 Tinea unguium: Secondary | ICD-10-CM | POA: Diagnosis not present

## 2019-06-30 DIAGNOSIS — M79674 Pain in right toe(s): Secondary | ICD-10-CM | POA: Diagnosis not present

## 2019-06-30 DIAGNOSIS — M2041 Other hammer toe(s) (acquired), right foot: Secondary | ICD-10-CM | POA: Diagnosis not present

## 2019-06-30 DIAGNOSIS — M79675 Pain in left toe(s): Secondary | ICD-10-CM | POA: Diagnosis not present

## 2019-06-30 NOTE — Telephone Encounter (Signed)
Patient's wife advised of labs.

## 2019-08-03 NOTE — Progress Notes (Signed)
Patient: Jared Rice Male    DOB: 04/10/1933   86 y.o.   MRN: [id number] Visit Date: 08/04/2019  Today's Provider: Wilhemena Durie, MD   Chief Complaint  Patient presents with  . Leg Swelling   Subjective:     HPI  Patient has been doing well and enjoying the warmer weather.  He has been outdoors a lot. Patient presents today for a red spot on his right leg. Patient thinks he may have hit his leg on something and it bled a few days ago but the last few days it has just been red. Patient says that it has not been itching. Patient has noticed some swelling.  No fever or chills.  Drainage. Allergies  Allergen Reactions  . Dexilant [Dexlansoprazole] Rash  . Primidone Itching and Rash    rash     Current Outpatient Medications:  .  apixaban (ELIQUIS) 5 MG TABS tablet, Take 1 tablet (5 mg total) by mouth 2 (two) times daily., Disp: 180 tablet, Rfl: 3 .  diphenhydramine-acetaminophen (TYLENOL PM) 25-500 MG TABS, Take 2 tablets by mouth at bedtime as needed., Disp: , Rfl:  .  doxazosin (CARDURA) 8 MG tablet, Take 1 tablet (8 mg total) by mouth daily., Disp: 90 tablet, Rfl: 3 .  furosemide (LASIX) 20 MG tablet, Take 2 tablets by mouth daily, Disp: 180 tablet, Rfl: 2 .  furosemide (LASIX) 40 MG tablet, Take 1 tablet (40 mg total) by mouth daily., Disp: 30 tablet, Rfl: 11 .  Iodoquinol-HC-Aloe Polysacch (ALCORTIN A) 1-2-1 % GEL, as needed. , Disp: , Rfl: 3 .  levothyroxine (SYNTHROID) 75 MCG tablet, Take 1 tablet by mouth daily in the morning, Disp: 90 tablet, Rfl: 2 .  pantoprazole (PROTONIX) 40 MG tablet, Take 1 tablet by mouth once daily, Disp: 90 tablet, Rfl: 1 .  quinapril (ACCUPRIL) 20 MG tablet, Take 1 tablet (20 mg total) by mouth daily., Disp: 90 tablet, Rfl: 3 .  rosuvastatin (CRESTOR) 20 MG tablet, Take 1 tablet (20 mg total) by mouth daily., Disp: 90 tablet, Rfl: 3 .  topiramate (TOPAMAX) 50 MG tablet, Take 1 tablet (50 mg total) by mouth 2 (two) times daily.,  Disp: 60 tablet, Rfl: 5 .  sulfamethoxazole-trimethoprim (BACTRIM DS) 800-160 MG tablet, Take 1 tablet by mouth 2 (two) times daily. (Patient not taking: Reported on 06/22/2019), Disp: 20 tablet, Rfl: 0  Review of Systems  Constitutional: Negative for appetite change, chills and fever.  Eyes: Negative.   Respiratory: Negative for chest tightness, shortness of breath and wheezing.   Cardiovascular: Negative for chest pain and palpitations.  Gastrointestinal: Negative for abdominal pain, nausea and vomiting.  Endocrine: Negative.   Skin: Positive for color change and wound.  Allergic/Immunologic: Negative.   Neurological: Negative.   Hematological: Negative.   Psychiatric/Behavioral: Negative.     Social History   Tobacco Use  . Smoking status: Former Smoker    Packs/day: 2.00    Years: 31.00    Pack years: 62.00    Types: Cigarettes    Quit date: 12/24/1980    Years since quitting: 38.6  . Smokeless tobacco: Never Used  . Tobacco comment: quit in 1982  Substance Use Topics  . Alcohol use: No    Alcohol/week: 0.0 standard drinks      Objective:   BP 133/74 (BP Location: Right Arm, Patient Position: Sitting, Cuff Size: Large)   Pulse 74   Temp (!) 97.1 F (36.2 C) (Temporal)  Wt 284 lb (128.8 kg)   BMI 37.47 kg/m  Vitals:   08/04/19 1122  BP: 133/74  Pulse: 74  Temp: (!) 97.1 F (36.2 C)  TempSrc: Temporal  Weight: 284 lb (128.8 kg)  Body mass index is 37.47 kg/m.   Physical Exam Vitals reviewed.  Constitutional:      Appearance: He is obese.  HENT:     Head: Normocephalic and atraumatic.     Right Ear: External ear normal.     Left Ear: External ear normal.  Eyes:     General: No scleral icterus. Cardiovascular:     Rate and Rhythm: Normal rate and regular rhythm.     Heart sounds: Normal heart sounds.     Comments: No gallops or rubs noted. Pulmonary:     Effort: Pulmonary effort is normal.     Breath sounds: Normal breath sounds.  Abdominal:      Palpations: Abdomen is soft.  Musculoskeletal:     Right lower leg: Edema present.     Left lower leg: Edema present.     Comments: 1+ LE edema LE erythema with some mild warmth and induration noted.  Abrasion over top of the areas noted.  It is about the size of a softball.  No abscess formation.  Skin:    General: Skin is warm and dry.     Comments: There is significant bruising of the left leg above the knee and medially.  There is some moderate amount of bruising of the right lower leg.  He is able to ambulate. Left lower leg is erythematous in lateral calf and tender to touch.  Neurological:     General: No focal deficit present.     Mental Status: He is alert and oriented to person, place, and time.  Psychiatric:        Mood and Affect: Mood normal.        Behavior: Behavior normal.        Thought Content: Thought content normal.        Judgment: Judgment normal.      No results found for any visits on 08/04/19.     Assessment & Plan    1. Edema of both legs Patient advised to elevate legs whenever possible.  2. Wound cellulitis- right lower leg anterior  This is been a recurrent problem.  Treat for 10 days and follow expectantly. - sulfamethoxazole-trimethoprim (BACTRIM DS) 800-160 MG tablet; Take 1 tablet by mouth 2 (two) times daily.  Dispense: 20 tablet; Refill: 0     Richard Cranford Mon, MD  [facility]

## 2019-08-04 ENCOUNTER — Other Ambulatory Visit: Payer: Self-pay

## 2019-08-04 ENCOUNTER — Ambulatory Visit (INDEPENDENT_AMBULATORY_CARE_PROVIDER_SITE_OTHER): Payer: PPO | Admitting: Family Medicine

## 2019-08-04 ENCOUNTER — Other Ambulatory Visit: Payer: Self-pay | Admitting: Family Medicine

## 2019-08-04 VITALS — BP 133/74 | HR 74 | Temp 97.1°F | Wt 284.0 lb

## 2019-08-04 DIAGNOSIS — L0390 Cellulitis, unspecified: Secondary | ICD-10-CM | POA: Diagnosis not present

## 2019-08-04 DIAGNOSIS — R600 Localized edema: Secondary | ICD-10-CM | POA: Diagnosis not present

## 2019-08-04 DIAGNOSIS — I10 Essential (primary) hypertension: Secondary | ICD-10-CM

## 2019-08-04 MED ORDER — SULFAMETHOXAZOLE-TRIMETHOPRIM 800-160 MG PO TABS: 1.0000 | ORAL_TABLET | Freq: Two times a day (BID) | ORAL | 0 refills | Status: DC

## 2019-08-04 MED ORDER — QUINAPRIL HCL 20 MG PO TABS: 20.0000 mg | ORAL_TABLET | Freq: Every day | ORAL | 3 refills | Status: DC

## 2019-08-04 NOTE — Telephone Encounter (Signed)
[place] faxed refill request for the following medications:  quinapril (ACCUPRIL) 20 MG tablet  90 day supply Last Rx: 07/06/2018 LOV: 06/22/2019 Pt is schedule for appt with Dr. Rosanna Randy today. Please advise. Thanks TNP

## 2019-08-09 DIAGNOSIS — M7581 Other shoulder lesions, right shoulder: Secondary | ICD-10-CM | POA: Diagnosis not present

## 2019-08-09 DIAGNOSIS — M1711 Unilateral primary osteoarthritis, right knee: Secondary | ICD-10-CM | POA: Diagnosis not present

## 2019-09-13 DIAGNOSIS — M79675 Pain in left toe(s): Secondary | ICD-10-CM | POA: Diagnosis not present

## 2019-09-13 DIAGNOSIS — M79674 Pain in right toe(s): Secondary | ICD-10-CM | POA: Diagnosis not present

## 2019-09-13 DIAGNOSIS — B351 Tinea unguium: Secondary | ICD-10-CM | POA: Diagnosis not present

## 2019-09-20 ENCOUNTER — Telehealth: Payer: Self-pay | Admitting: Family Medicine

## 2019-09-20 NOTE — Chronic Care Management (AMB) (Signed)
  Chronic Care Management   Note  09/20/2019 Name: Jared Rice MRN: [id number] DOB: 02-Feb-1933  Gerda Diss Comp is a 86 y.o. year old male who is a primary care patient of Jerrol Banana., MD. I reached out to [place] by phone today in response to a referral sent by Jared Rice's health plan.     Jared Rice was given information about Chronic Care Management services today including:  1. CCM service includes personalized support from designated clinical staff supervised by his physician, including individualized plan of care and coordination with other care providers 2. 24/7 contact phone numbers for assistance for urgent and routine care needs. 3. Service will only be billed when office clinical staff spend 20 minutes or more in a month to coordinate care. 4. Only one practitioner may furnish and bill the service in a calendar month. 5. The patient may stop CCM services at any time (effective at the end of the month) by phone call to the office staff. 6. The patient will be responsible for cost sharing (co-pay) of up to 20% of the service fee (after annual deductible is met).  Patient agreed to services and verbal consent obtained.   Follow up plan: Telephone appointment with care management team member scheduled for:10/01/2019  [place], [place], [place] [id number] Direct Dial: [email or web address] Website: [email or web address]

## 2019-10-01 ENCOUNTER — Ambulatory Visit: Payer: PPO | Admitting: Pharmacist

## 2019-10-01 ENCOUNTER — Other Ambulatory Visit: Payer: Self-pay

## 2019-10-01 DIAGNOSIS — I10 Essential (primary) hypertension: Secondary | ICD-10-CM

## 2019-10-01 DIAGNOSIS — E785 Hyperlipidemia, unspecified: Secondary | ICD-10-CM

## 2019-10-01 NOTE — Chronic Care Management (AMB) (Signed)
Chronic Care Management Pharmacy  Name: Jared Rice  MRN: [id number] DOB: August 22, 1932  Chief Complaint/ HPI  Jared Rice,  86 y.o. , male presents for their Initial CCM visit with the clinical pharmacist via telephone due to COVID-19 Pandemic.  PCP : Jared Rice., MD  Their chronic conditions include: class 2 obesity, afib, angina, HLD, HTN, COPD, DM, hyopthyroid, neuropathy  Office Visits: 3/10 edema, Gilbert, BP 133/74 P 74 Wt 284 BMI 37.5, elevate legs, cellulitis Bactrim DS bid x 10d 1/26 annual, Gilbert, BP 142/81 P 69 BMI 36.6, exercise 30 min 3x/wk, severe obesity, advised wt loss, angina,   Consult Visit: 1/13 edema w/cellulitis, Robson, BP 141/86 P 91, stockings recommended   Medications: Outpatient Encounter Medications as of 10/01/2019  Medication Sig  . apixaban (ELIQUIS) 5 MG TABS tablet Take 1 tablet (5 mg total) by mouth 2 (two) times daily.  . diphenhydramine-acetaminophen (TYLENOL PM) 25-500 MG TABS Take 2 tablets by mouth at bedtime as needed.  . doxazosin (CARDURA) 8 MG tablet Take 1 tablet (8 mg total) by mouth daily.  . furosemide (LASIX) 40 MG tablet Take 1 tablet (40 mg total) by mouth daily.  [name] levothyroxine (SYNTHROID) 75 MCG tablet Take 1 tablet by mouth daily in the morning  . pantoprazole (PROTONIX) 40 MG tablet Take 1 tablet by mouth once daily  . quinapril (ACCUPRIL) 20 MG tablet Take 1 tablet (20 mg total) by mouth daily.  . rosuvastatin (CRESTOR) 20 MG tablet Take 1 tablet (20 mg total) by mouth daily.  [name] topiramate (TOPAMAX) 50 MG tablet Take 1 tablet (50 mg total) by mouth 2 (two) times daily.  . furosemide (LASIX) 20 MG tablet Take 2 tablets by mouth daily (Patient not taking: Reported on 10/01/2019)  . Iodoquinol-HC-Aloe Polysacch (ALCORTIN A) 1-2-1 % GEL as needed.   . sulfamethoxazole-trimethoprim (BACTRIM DS) 800-160 MG tablet Take 1 tablet by mouth 2 (two) times daily. (Patient not taking: Reported on 10/01/2019)   No  facility-administered encounter medications on file as of 10/01/2019.     Physical Activity: Inactive  . Days of Exercise per Week: 0 days  . Minutes of Exercise per Session: 0 min    Current Diagnosis/Assessment:  Goals Addressed            This Visit's Progress   . Hyperlipidemia - goal LDL < 70       CARE PLAN ENTRY (see longitudinal plan of care for additional care plan information)  Current Barriers:  . Controlled hyperlipidemia, complicated by hypertension, hyperglycemia . Current antihyperlipidemic regimen: Crestor 88m daily . Previous antihyperlipidemic medications tried NA . Most recent lipid panel:     Component Value Date/Time   CHOL 104 06/25/2019 0908   TRIG 45 06/25/2019 0908   HDL 42 06/25/2019 0908   CHOLHDL 2.5 06/25/2019 0908   LDLCALC 51 06/25/2019 0908 .   .Marland KitchenASCVD risk enhancing conditions: age >>65 DM, HTN . 10-year ASCVD risk score: NA  Pharmacist Clinical Goal(s):  .Marland KitchenOver the next 90 days, patient will work with PharmD and providers towards continuing optimized antihyperlipidemic therapy  Interventions: . Comprehensive medication review performed; medication list updated in electronic medical record.  .[facility] team collaboration (see longitudinal plan of care) . Correct medication list to Crestor 136mdaily  Patient Self Care Activities:  . Patient will focus on medication adherence by ensuring medication list reflects how he is actually taking the rosuvastatin  Initial goal documentation     .  Hypertension - goal BP 140/90       CARE PLAN ENTRY (see longitudinal plan of care for additional care plan information)  Current Barriers:  . Controlled hypertension, complicated by hyperlipidemia, obesity . Current antihypertensive regimen: quinapril 18m daily . Previous antihypertensives tried: NA . Last practice recorded BP readings:  BP Readings from Last 3 Encounters:  08/04/19 133/74  06/22/19 (!) 142/81  05/13/19  122/74 .   .Marland KitchenCurrent home BP readings: doesn't check . Most recent eGFR/CrCl: No results found for: EGFR  No components found for: CRCL  Pharmacist Clinical Goal(s):  .Marland KitchenOver the next 90 days, patient will work with PharmD and providers to continue optimized antihypertensive regimen . Ensure pulse can support propranolol for essential tremor  Interventions: . Inter-disciplinary care team collaboration (see longitudinal plan of care) . Comprehensive medication review performed; medication list updated in the electronic medical record.   Patient Self Care Activities:  . Patient will check BP and pulse, document, and provide at future appointments . Patient will focus on medication adherence by checking BP at home for the next 30 days  Initial goal documentation     . Pre-diabetes - goal A1c < 6.5%       CARE PLAN ENTRY (see longitudinal plan of care for additional care plan information)  Current Barriers:  . Pre-diabetes: type 2; complicated by chronic medical conditions including hypertension, hyperlipidemia Lab Results  Component Value Date   HGBA1C 6.3 (H) 06/25/2019 .   Lab Results  Component Value Date   CREATININE 0.92 06/25/2019   CREATININE 1.33 (H) 02/24/2019   CREATININE 1.03 06/03/2018 .   .Marland KitchenNo results found for: EGFR . Current antihyperglycemic regimen: None . Denies hypoglycemic symptoms, including dizziness, lightheadedness, shaking, sweating . Denies hyperglycemic symptoms, including polyuria, polydipsia, polyphagia, nocturia, blurred vision, neuropathy . Current exercise: significantly limited by mobility, uses cane . Current blood glucose readings: doesn't check . Cardiovascular risk reduction: o Current hypertensive regimen: quinapril 242mdaily o Current hyperlipidemia regimen: Crestor 1013maily o Current antiplatelet regimen: anticoagulant Eliquis 5mg13mily  Pharmacist Clinical Goal(s):  . OvMarland Kitchenr the next 90 days, patient will work with PharmD and  primary care provider to address preventing pre-diabetes from becoming diabetes type 2  Interventions: . Comprehensive medication review performed, medication list updated in electronic medical record . Inter-disciplinary care team collaboration (see longitudinal plan of care) . Counseled that although diabetes is on the diagnosis list, there is no indication that measurements warranted this diagnosis at any time in the past  Patient Self Care Activities:  . Patient will make minor dietary changes and increase physical activity over the next 90 days . Patient will contact provider with any episodes of hypoglycemia . Patient will report any questions or concerns to provider   Initial goal documentation       Diabetes   Recent Relevant Labs: Lab Results  Component Value Date/Time   HGBA1C 6.3 (H) 06/25/2019 09:08 AM   HGBA1C 6.0 (H) 08/21/2016 08:16 AM     Checking BG: Never  Recent FBG Readings: doesn't check, well controlled Patient is currently controlled on the following medications: None  Last diabetic Foot exam:  Lab Results  Component Value Date/Time   HMDIABEYEEXA No Retinopathy 04/27/2019 12:00 AM    Last diabetic Eye exam: No results found for: HMDIABFOOTEX   We discussed:  Taking Topamax for essential tremor, not neuropathic pain, doesn't work  Patient has diagnosis DM, but appears to be pre-diabetic. Wound healed  Plan  Continue control with diet and exercise  D/c topiramate due to ineffectiveness  Hyperlipidemia   Lipid Panel     Component Value Date/Time   CHOL 104 06/25/2019 0908   TRIG 45 06/25/2019 0908   HDL 42 06/25/2019 0908   CHOLHDL 2.5 06/25/2019 0908   LDLCALC 51 06/25/2019 0908   LABVLDL 11 06/25/2019 0908     The ASCVD Risk score (Goff DC Jr., et al., 2013) failed to calculate for the following reasons:   The 2013 ASCVD risk score is only valid for ages 70 to 12   Patient has failed these meds in past: NA Patient is currently  controlled on the following medications: Crestor 63m daily  We discussed:   Takes 1/2 Crestor per MD Denies Myalgias, memory loss  Plan  Continue current medications  Correct entry Crestor 163m1 tab by mouth daily  Hypertension   Office blood pressures are  BP Readings from Last 3 Encounters:  08/04/19 133/74  06/22/19 (!) 142/81  05/13/19 122/74    Patient has failed these meds in the past: NA  Patient checks BP at home Never  Patient home BP readings are ranging: NA  We discussed   1/26 elevation likely due to wound care  Reads low at MD, compared to store/friends machines  Plan  Continue current medications   Medication Management   Pt uses [place] all medications Uses pill box? Yes Pt endorses 100% compliance, Eliquis was separated  We discussed: Eliquis last filled 12/21 (didn't take right away), gaps with doxazosin, quinapril, Crestor  Protonix for ulcer 4 years ago, never followed Tried primodone rash Propranolol caused low pulse, may have been overdosed Try propranol 1045mid Counseled on old Alcortin 2019, used for groin rash  Gets UTIs  Wants to stick with Elixir  Plan  Continue current medication management strategy  Stop expired Alcortin Start propranolol 33m24mtab daily for 14 days Increase propranolol 33mg47mab twice daily Consider trial d/c pantoprazole  Follow up: 1 month phone visit   Heart Failure   Type: unknown  Last ejection fraction: NA NYHA Class: III (marked limitation of activity) AHA HF Stage: D (Advanced disease requiring aggressive medical therapy)  Patient has failed these meds in past: NA Patient is currently controlled on the following medications: quinapril,  Furosemide, Crestor, Eliquis  We discussed  Takes Lasix 20mg,32m 40mg W60m compression stocking Elevates feet when possible Uses cane, but taking Tylenol PM, counseled on falls risk  Plan  Continue current medications  D/c  furosemide 40mg fr65medication list  Chairty Toman HancMilus Height, BCGP, CTMichiana ShoresliErwin-[phone]

## 2019-10-03 NOTE — Patient Instructions (Addendum)
Visit Information  Goals Addressed            This Visit's Progress   . Hyperlipidemia - goal LDL < 70       CARE PLAN ENTRY (see longitudinal plan of care for additional care plan information)  Current Barriers:  . Controlled hyperlipidemia, complicated by hypertension, hyperglycemia . Current antihyperlipidemic regimen: Crestor 90m daily . Previous antihyperlipidemic medications tried NA . Most recent lipid panel:     Component Value Date/Time   CHOL 104 06/25/2019 0908   TRIG 45 06/25/2019 0908   HDL 42 06/25/2019 0908   CHOLHDL 2.5 06/25/2019 0908   LDLCALC 51 06/25/2019 0908 .   .Marland KitchenASCVD risk enhancing conditions: age >>57 DM, HTN . 10-year ASCVD risk score: NA  Pharmacist Clinical Goal(s):  .Marland KitchenOver the next 90 days, patient will work with PharmD and providers towards continuing optimized antihyperlipidemic therapy  Interventions: . Comprehensive medication review performed; medication list updated in electronic medical record.  .[facility] team collaboration (see longitudinal plan of care) . Correct medication list to Crestor 112mdaily  Patient Self Care Activities:  . Patient will focus on medication adherence by ensuring medication list reflects how he is actually taking the rosuvastatin  Initial goal documentation     . Hypertension - goal BP 140/90       CARE PLAN ENTRY (see longitudinal plan of care for additional care plan information)  Current Barriers:  . Controlled hypertension, complicated by hyperlipidemia, obesity . Current antihypertensive regimen: quinapril 2055maily . Previous antihypertensives tried: NA . Last practice recorded BP readings:  BP Readings from Last 3 Encounters:  08/04/19 133/74  06/22/19 (!) 142/81  05/13/19 122/74 .   . CMarland Kitchenrrent home BP readings: doesn't check . Most recent eGFR/CrCl: No results found for: EGFR  No components found for: CRCL  Pharmacist Clinical Goal(s):  . OMarland Kitchener the next 90 days, patient  will work with PharmD and providers to continue optimized antihypertensive regimen . Ensure pulse can support propranolol for essential tremor  Interventions: . Inter-disciplinary care team collaboration (see longitudinal plan of care) . Comprehensive medication review performed; medication list updated in the electronic medical record.   Patient Self Care Activities:  . Patient will check BP and pulse, document, and provide at future appointments . Patient will focus on medication adherence by checking BP at home for the next 30 days  Initial goal documentation     . Pre-diabetes - goal A1c < 6.5%       CARE PLAN ENTRY (see longitudinal plan of care for additional care plan information)  Current Barriers:  . Pre-diabetes: type 2; complicated by chronic medical conditions including hypertension, hyperlipidemia Lab Results  Component Value Date   HGBA1C 6.3 (H) 06/25/2019 .   Lab Results  Component Value Date   CREATININE 0.92 06/25/2019   CREATININE 1.33 (H) 02/24/2019   CREATININE 1.03 06/03/2018 .   . NMarland Kitchen results found for: EGFR . Current antihyperglycemic regimen: None . Denies hypoglycemic symptoms, including dizziness, lightheadedness, shaking, sweating . Denies hyperglycemic symptoms, including polyuria, polydipsia, polyphagia, nocturia, blurred vision, neuropathy . Current exercise: significantly limited by mobility, uses cane . Current blood glucose readings: doesn't check . Cardiovascular risk reduction: o Current hypertensive regimen: quinapril 76m18mily o Current hyperlipidemia regimen: Crestor 10mg62mly o Current antiplatelet regimen: anticoagulant Eliquis 5mg d68my  Pharmacist Clinical Goal(s):  . OverMarland Kitchenthe next 90 days, patient will work with PharmD and primary care provider to address preventing pre-diabetes from  becoming diabetes type 2  Interventions: . Comprehensive medication review performed, medication list updated in electronic medical  record . Inter-disciplinary care team collaboration (see longitudinal plan of care) . Counseled that although diabetes is on the diagnosis list, there is no indication that measurements warranted this diagnosis at any time in the past  Patient Self Care Activities:  . Patient will make minor dietary changes and increase physical activity over the next 90 days . Patient will contact provider with any episodes of hypoglycemia . Patient will report any questions or concerns to provider   Initial goal documentation        Mr. Liggett was given information about Chronic Care Management services today including:  1. CCM service includes personalized support from designated clinical staff supervised by his physician, including individualized plan of care and coordination with other care providers 2. 24/7 contact phone numbers for assistance for urgent and routine care needs. 3. Standard insurance, coinsurance, copays and deductibles apply for chronic care management only during months in which we provide at least 20 minutes of these services. Most insurances cover these services at 100%, however patients may be responsible for any copay, coinsurance and/or deductible if applicable. This service may help you avoid the need for more expensive face-to-face services. 4. Only one practitioner may furnish and bill the service in a calendar month. 5. The patient may stop CCM services at any time (effective at the end of the month) by phone call to the office staff.  Patient agreed to services and verbal consent obtained.   Print copy of patient instructions provided.  Telephone follow up appointment with pharmacy team member scheduled for: 1 month  Milus Height, PharmD, [place], [place] [phone]  Diet for Metabolic Syndrome Metabolic syndrome is a disorder that includes three or more of the following conditions:  Abdominal obesity, as seen in a large  waist circumference.  Too much sugar (glucose) in your blood.  High blood pressure (hypertension).  Higher-than-normal amount of fat (lipids) in your blood.  Lower-than-normal level of "good" cholesterol (HDL). Keeping an active lifestyle and following a healthy diet plan can help you manage your condition. These changes can also help to prevent the development of conditions that are associated with metabolic syndrome, such as diabetes, heart disease, and stroke. Following a heart-healthy diet can help you to lower your risk of heart disease and improve metabolic syndrome. A heart-healthy diet includes lean proteins, healthy fats, nuts, and plenty of fruits and vegetables. Along with regular exercise, a healthy diet:  Helps to improve the way the body uses insulin.  Helps with weight loss. A common goal for people with this condition is to lose 7-10% or more of their starting weight. ? For example, a person who starts at 300 lb (136 kg) and loses 30 lb (13.6 kg) has lost 10% of his or her starting weight. What are tips for following this plan?  Use the glycemic index (GI) to plan your meals. The index tells you how quickly a food will raise your blood sugar. Choose foods that have low GI values. Those foods raise blood sugar more slowly.  Keep track of how many calories you eat and drink. Consuming the right amount of calories will help you achieve a healthy weight.  Consider following a Mediterranean diet. This diet includes lots of vegetables, lean meats or fish, whole grains, fruits, and healthy oils and fats.  Use more herbs and spices to add flavor to your  food instead of using butter and salt (sodium).  Try to eat fish 1-2 times a week.  Choose a variety of fruits, vegetables, and other recommended foods. What foods can I eat?  Fruits Berries. Apples. Oranges. Peaches. Apricots. Plums. Grapes. Mango. Papaya. Pomegranate. Kiwi. Cherries. Vegetables Lettuce. Spinach. Leafy  greens, including kale, chard, collard greens, and mustard greens. Peas. Beets. Cauliflower. Cabbage. Broccoli. Carrots. Green beans. Tomatoes. Squash. Eggplant. Peppers. Onions. Cucumbers. Brussels sprouts. Sweet potatoes. Yams. Beans. Lentils. Grains Stone-ground whole wheat. Pumpernickel bread. Whole-grain bread, crackers, tortillas, cereal, and pasta. Unsweetened oatmeal. Bulgur. Barley. Quinoa. Brown rice or wild rice. Meats and other proteins Seafood and shellfish. Lean meats. Poultry. Tofu. Nuts and seeds. Dairy Low-fat or fat-free dairy products, such as milk, yogurt, and cheese. Fats and oils Avocado. Canola or olive oil. Nuts and nut butters. Seeds. Tahini. Beverages Water. Low-fat milk. Milk alternatives, like soy milk or almond milk. Real fruit juice. Seasonings and condiments Low-sugar or sugar-free ketchup, barbecue sauce, and mayonnaise. Mustard. Relish. Herbs. The items listed above may not be a complete list of foods and beverages you can eat. Contact a dietitian for more information. What foods are not recommended? Meats and other proteins Red meat. Fats and oils Saturated fats, such as butter, shortening, and palm oil. Beverages Alcohol. Sweetened drinks, such as iced tea and soda. Other foods Fried foods. Sweets. Salty foods. The items listed above may not be a complete list of foods and beverages you should avoid. Contact a dietitian for more information. Summary  Metabolic syndrome is a combination of conditions such as abdominal obesity, high blood sugar, high blood pressure, and unhealthy levels of cholesterol. Metabolic syndrome raises your risk of developing heart disease, diabetes, and stroke.  Following a heart-healthy diet can help you lower your risk of heart disease and improve metabolic syndrome. This diet includes lean proteins, healthy fats, nuts, and plenty of fruits and vegetables.  Along with regular exercise, following a healthy diet can help your  body use insulin better and help you lose weight.  A common goal for people with metabolic syndrome is to lose 7-10% of their starting weight. This information is not intended to replace advice given to you by your health care provider. Make sure you discuss any questions you have with your health care provider. Document Revised: 09/02/2018 Document Reviewed: 03/14/2017 Elsevier Patient Education  [place].

## 2019-10-06 ENCOUNTER — Other Ambulatory Visit: Payer: Self-pay

## 2019-10-06 ENCOUNTER — Ambulatory Visit: Payer: PPO | Admitting: Dermatology

## 2019-10-06 DIAGNOSIS — D692 Other nonthrombocytopenic purpura: Secondary | ICD-10-CM | POA: Diagnosis not present

## 2019-10-06 DIAGNOSIS — L304 Erythema intertrigo: Secondary | ICD-10-CM

## 2019-10-06 DIAGNOSIS — Z872 Personal history of diseases of the skin and subcutaneous tissue: Secondary | ICD-10-CM

## 2019-10-06 DIAGNOSIS — B372 Candidiasis of skin and nail: Secondary | ICD-10-CM | POA: Diagnosis not present

## 2019-10-06 DIAGNOSIS — L578 Other skin changes due to chronic exposure to nonionizing radiation: Secondary | ICD-10-CM

## 2019-10-06 DIAGNOSIS — Z86007 Personal history of in-situ neoplasm of skin: Secondary | ICD-10-CM

## 2019-10-06 DIAGNOSIS — Z85828 Personal history of other malignant neoplasm of skin: Secondary | ICD-10-CM | POA: Diagnosis not present

## 2019-10-06 MED ORDER — FLUCONAZOLE 200 MG PO TABS: ORAL_TABLET | ORAL | 0 refills | Status: DC

## 2019-10-06 NOTE — Progress Notes (Signed)
Follow-Up Visit   Subjective  Jared Rice is a 86 y.o. male who presents for the following: Rash (Groin area, patient is using [employer] Medicated powder. Would like a replacement for Alcortin cream), History of SCCis (Right forearm 09/2007), History of SCC (Left med lat pretibial 09/2013), and History of BCC (Left ant med prox thigh, Right int.lat thigh, Right medial forearm and Left shoulder).  The following portions of the chart were reviewed this encounter and updated as appropriate:  Tobacco  Allergies  Meds  Problems  Med Hx  Surg Hx  Fam Hx      Review of Systems:  No other skin or systemic complaints except as noted in HPI or Assessment and Plan.  Objective  Well appearing patient in no apparent distress; mood and affect are within normal limits.  A focused examination was performed including Groin. Relevant physical exam findings are noted in the Assessment and Plan.  Objective  Groin: Bright red erythema of groin inferior abdominal area  Objective  Left Medial lateral pertibial: Well healed scar with no evidence of recurrence, no lymphadenopathy.   Objective  Head - Anterior (Face): Clear today  Objective  Right medial forearm, Left Shoulder, R interior lateral thigh, left anterior medical proximal thigh (4): Well healed scar with no evidence of recurrence.   Objective  Right Forearm: Well healed scar with no evidence of recurrence.    Assessment & Plan  Erythema intertrigo Groin  With Candida Will Start [facility] Intertrigo cream (Iodoquinol 1% Hydrocortisone 2.5%, and Niacinamide 2% cream) apply to affected area twice daily as needed #30g 6RF Will start Diflucan Take 1 Tab on Monday, Wednesday, and Friday, for two weeks #6 tabs, 0RF  DO NOT TAKE Crestor while taking this medication  Patient has not history of Liver problems.   fluconazole (DIFLUCAN) 200 MG tablet - Groin  History of SCC (squamous cell carcinoma) of skin Left Medial  lateral pertibial  Clear. Observe for recurrence. Call clinic for new or changing lesions.  Recommend regular skin exams, daily broad-spectrum spf 30+ sunscreen use, and photoprotection.  '  History of actinic keratoses Head - Anterior (Face)  Face and scalp clear today   Observation.  Call clinic for new or changing moles.  Recommend daily use of broad spectrum spf 30+ sunscreen to sun-exposed areas.    History of basal cell carcinoma (BCC) (4) Right medial forearm, Left Shoulder, R interior lateral thigh, left anterior medical proximal thigh  Clear. Observe for recurrence. Call clinic for new or changing lesions.  Recommend regular skin exams, daily broad-spectrum spf 30+ sunscreen use, and photoprotection.     History of squamous cell carcinoma in situ (SCCIS) of skin Right Forearm  Clear. Observe for recurrence. Call clinic for new or changing lesions.  Recommend regular skin exams, daily broad-spectrum spf 30+ sunscreen use, and photoprotection.      Actinic Damage - diffuse scaly erythematous macules with underlying dyspigmentation - Recommend daily broad spectrum sunscreen SPF 30+ to sun-exposed areas, reapply every 2 hours as needed.  - Call for new or changing lesions.  Purpura - Violaceous macules and patches - Benign - Related to age, sun damage and/or use of blood thinners - Observe - Can use OTC arnica containing moisturizer such as Dermend Bruise Formula if desired - Call for worsening or other concerns  . Return in about 4 months (around 02/06/2020) for TBSE.  I, Donzetta Kohut, CMA, am acting as scribe for Sarina Ser, MD . Documentation: I  have reviewed the above documentation for accuracy and completeness, and I agree with the above.  Sarina Ser, MD

## 2019-10-06 NOTE — Patient Instructions (Addendum)
Instructions for [facility] Medications  One or more of your medications was sent to the [facility] mail order compounding pharmacy. You will receive an email from them and can purchase the medicine through that link. It will then be mailed to your home at the address you confirmed. If for any reason you do not receive an email from them, please check your spam folder. If you still do not find the email, please let us know.    While taking the Diflucan DO NOT TAKE Crestor while taking this medication

## 2019-10-13 ENCOUNTER — Encounter: Payer: Self-pay | Admitting: Dermatology

## 2019-10-15 NOTE — Progress Notes (Signed)
Trena Platt Cummings,acting as a scribe for Wilhemena Durie, MD.,have documented all relevant documentation on the behalf of Wilhemena Durie, MD,as directed by  Wilhemena Durie, MD while in the presence of Wilhemena Durie, MD.  Established patient visit   Patient: Jared Rice   DOB: Nov 02, 1932   86 y.o. Male  MRN: [id number] Visit Date: 10/20/2019  Today's healthcare provider: Wilhemena Durie, MD   Chief Complaint  Patient presents with  . Hypertension  . Hyperlipidemia   Subjective    HPI Patient comes in today with an additional issue that he struck this right lower leg on the side of the toilet this morning.  And has bruising and central superficial laceration.  There is no secondary infection.  He has has chronic venous insufficiency with significant lower extremity edema which is causing at the use through the laceration little bit.  It is tender to the touch.  No induration or warmth. Hypertension, follow-up  BP Readings from Last 3 Encounters:  10/20/19 (!) 143/90  08/04/19 133/74  06/22/19 (!) 142/81   Wt Readings from Last 3 Encounters:  10/20/19 278 lb (126.1 kg)  08/04/19 284 lb (128.8 kg)  06/22/19 277 lb (125.6 kg)     He was last seen for hypertension 4 months ago.  BP at that visit was 142/81. Management since that visit includes; continue medications. He reports excellent compliance with treatment. He is not having side effects.  He is not exercising. He is not adherent to low salt diet.   Outside blood pressures are not checking.  He does not smoke.  Use of agents associated with hypertension: NSAIDS.   --------------------------------------------------------------------------------------------------- Lipid/Cholesterol, follow-up  Last Lipid Panel: Lab Results  Component Value Date   CHOL 104 06/25/2019   LDLCALC 51 06/25/2019   HDL 42 06/25/2019   TRIG 45 06/25/2019    He was last seen for this 4 months ago.  Management  since that visit includes; labs checked showing-stable. He reports excellent compliance with treatment. He is not having side effects.  He is following a Regular diet. Current exercise: none  Last metabolic panel Lab Results  Component Value Date   GLUCOSE 100 (H) 06/25/2019   NA 143 06/25/2019   K 4.3 06/25/2019   BUN 15 06/25/2019   CREATININE 0.92 06/25/2019   GFRNONAA 76 06/25/2019   GFRAA 88 06/25/2019   CALCIUM 8.7 06/25/2019   AST 9 06/25/2019   ALT 6 06/25/2019   The ASCVD Risk score Mikey Bussing DC Jr., et al., 2013) failed to calculate for the following reasons:   The 2013 ASCVD risk score is only valid for ages 34 to 5  ---------------------------------------------------------------------------------------------------  Class 2 severe obesity due to excess calories with serious comorbidity and body mass index (BMI) of 36.0 to 36.9 in adult [facility]) From 06/22/2019-Any weight loss might help overall health issues.  Atherosclerosis of native coronary artery of native heart with stable angina pectoris ([place]) From 06/22/2019-All risk factors treated.  Prediabetes From 06/22/2019-Hemoglobin A1c 6.3.  Atrial fibrillation, unspecified type ([place]) From 06/22/2019-On apixaban.  Social History   Tobacco Use  . Smoking status: Former Smoker    Packs/day: 2.00    Years: 31.00    Pack years: 62.00    Types: Cigarettes    Quit date: 12/24/1980    Years since quitting: 38.8  . Smokeless tobacco: Never Used  . Tobacco comment: quit in 1982  Substance Use Topics  . Alcohol use: No  Alcohol/week: 0.0 standard drinks  . Drug use: No       Medications: Outpatient Medications Prior to Visit  Medication Sig  . apixaban (ELIQUIS) 5 MG TABS tablet Take 1 tablet (5 mg total) by mouth 2 (two) times daily.  . diphenhydramine-acetaminophen (TYLENOL PM) 25-500 MG TABS Take 2 tablets by mouth at bedtime as needed.  . doxazosin (CARDURA) 8 MG tablet Take 1 tablet (8 mg total) by mouth  daily.  . fluconazole (DIFLUCAN) 200 MG tablet Take 1 Tab on Monday, Wednesday, and Friday, for two weeks DO NOT TAKE Crestor while taking this medication  . furosemide (LASIX) 20 MG tablet Take 2 tablets by mouth daily  . furosemide (LASIX) 40 MG tablet Take 1 tablet (40 mg total) by mouth daily.  Jerrye Beavers Polysacch (ALCORTIN A) 1-2-1 % GEL as needed.   [name] levothyroxine (SYNTHROID) 75 MCG tablet Take 1 tablet by mouth daily in the morning  . pantoprazole (PROTONIX) 40 MG tablet Take 1 tablet by mouth once daily  . quinapril (ACCUPRIL) 20 MG tablet Take 1 tablet (20 mg total) by mouth daily.  . rosuvastatin (CRESTOR) 20 MG tablet Take 1 tablet (20 mg total) by mouth daily.  [name] topiramate (TOPAMAX) 50 MG tablet Take 1 tablet (50 mg total) by mouth 2 (two) times daily.  . [DISCONTINUED] sulfamethoxazole-trimethoprim (BACTRIM DS) 800-160 MG tablet Take 1 tablet by mouth 2 (two) times daily. (Patient not taking: Reported on 10/01/2019)   No facility-administered medications prior to visit.    Review of Systems  Constitutional: Negative for appetite change, chills and fever.  Respiratory: Negative for chest tightness, shortness of breath and wheezing.   Cardiovascular: Negative for chest pain and palpitations.  Gastrointestinal: Negative for abdominal pain, nausea and vomiting.    Last hemoglobin A1c Lab Results  Component Value Date   HGBA1C 6.2 (A) 10/20/2019      Objective    BP (!) 143/90 (BP Location: Left Arm, Patient Position: Sitting, Cuff Size: Large)   Pulse 82   Temp (!) 97.1 F (36.2 C) (Temporal)   Ht 6\' 2"  (1.88 m)   Wt 278 lb (126.1 kg)   BMI 35.69 kg/m  BP Readings from Last 3 Encounters:  10/20/19 (!) 143/90  08/04/19 133/74  06/22/19 (!) 142/81   Wt Readings from Last 3 Encounters:  10/20/19 278 lb (126.1 kg)  08/04/19 284 lb (128.8 kg)  06/22/19 277 lb (125.6 kg)      Physical Exam Vitals reviewed.  Constitutional:      Appearance: Normal  appearance.  HENT:     Head: Normocephalic and atraumatic.     Right Ear: External ear normal.     Left Ear: External ear normal.  Eyes:     General: No scleral icterus.    Conjunctiva/sclera: Conjunctivae normal.  Cardiovascular:     Rate and Rhythm: Normal rate and regular rhythm.     Pulses: Normal pulses.     Heart sounds: Normal heart sounds.  Pulmonary:     Effort: Pulmonary effort is normal.     Breath sounds: Normal breath sounds.  Skin:    General: Skin is warm and dry.  Neurological:     General: No focal deficit present.     Mental Status: He is alert and oriented to person, place, and time.  Psychiatric:        Mood and Affect: Mood normal.        Behavior: Behavior normal.  Thought Content: Thought content normal.        Judgment: Judgment normal.       No results found for any visits on 10/20/19.  Assessment & Plan    1. Essential hypertension Good control.  2. Hyperlipidemia, unspecified hyperlipidemia type   3. Type 2 diabetes mellitus without complication, without long-term current use of insulin (HCC) A1c is 6.2 today.  Excellent control. - POCT HgB A1C  4. Venous insufficiency of both lower extremities He has 2-3+ lower extremity edema right versus left.  Subsequently because this bruising and superficial laceration to be slow to heal.  We have put in Unna boot on it and we will see him back in 4 days.  I think this will accelerate healing and help his discomfort and will greatly lower the risk of secondary infection  No follow-ups on file.      I, Wilhemena Durie, MD, have reviewed all documentation for this visit. The documentation on 10/24/19 for the exam, diagnosis, procedures, and orders are all accurate and complete.    Shemeca Lukasik Cranford Mon, MD  [facility] [place] [phone] (phone) [phone] (fax)  [place]

## 2019-10-20 ENCOUNTER — Ambulatory Visit (INDEPENDENT_AMBULATORY_CARE_PROVIDER_SITE_OTHER): Payer: PPO | Admitting: Family Medicine

## 2019-10-20 ENCOUNTER — Encounter: Payer: Self-pay | Admitting: Family Medicine

## 2019-10-20 ENCOUNTER — Other Ambulatory Visit: Payer: Self-pay

## 2019-10-20 VITALS — BP 143/90 | HR 82 | Temp 97.1°F | Ht 74.0 in | Wt 278.0 lb

## 2019-10-20 DIAGNOSIS — E785 Hyperlipidemia, unspecified: Secondary | ICD-10-CM | POA: Diagnosis not present

## 2019-10-20 DIAGNOSIS — I872 Venous insufficiency (chronic) (peripheral): Secondary | ICD-10-CM | POA: Diagnosis not present

## 2019-10-20 DIAGNOSIS — I10 Essential (primary) hypertension: Secondary | ICD-10-CM | POA: Diagnosis not present

## 2019-10-20 DIAGNOSIS — E119 Type 2 diabetes mellitus without complications: Secondary | ICD-10-CM

## 2019-10-20 LAB — POCT GLYCOSYLATED HEMOGLOBIN (HGB A1C)
Estimated Average Glucose: 131
Hemoglobin A1C: 6.2 % — AB (ref 4.0–5.6)

## 2019-10-20 NOTE — Progress Notes (Signed)
Trena Platt Cummings,acting as a scribe for Wilhemena Durie, MD.,have documented all relevant documentation on the behalf of Wilhemena Durie, MD,as directed by  Wilhemena Durie, MD while in the presence of Wilhemena Durie, MD.  Established patient visit   Patient: Jared Rice   DOB: February 06, 1933   86 y.o. Male  MRN: [id number] Visit Date: 10/26/2019  Today's healthcare provider: Wilhemena Durie, MD   Chief Complaint  Patient presents with   Leg Injury    bruise   Subjective    HPI   Patient presents today for a follow up on bruising on leg, that was wrapped in an unna boot at last visit.  It has improved since the last visit. From 10/20/2019-patient following up for Lemon size bruising on leg. Leg wrapped in [employer]. Advised to recheck leg in 1 week.      Medications: Outpatient Medications Prior to Visit  Medication Sig   apixaban (ELIQUIS) 5 MG TABS tablet Take 1 tablet (5 mg total) by mouth 2 (two) times daily.   diphenhydramine-acetaminophen (TYLENOL PM) 25-500 MG TABS Take 2 tablets by mouth at bedtime as needed.   doxazosin (CARDURA) 8 MG tablet Take 1 tablet (8 mg total) by mouth daily.   fluconazole (DIFLUCAN) 200 MG tablet Take 1 Tab on Monday, Wednesday, and Friday, for two weeks DO NOT TAKE Crestor while taking this medication   furosemide (LASIX) 20 MG tablet Take 2 tablets by mouth daily   furosemide (LASIX) 40 MG tablet Take 1 tablet (40 mg total) by mouth daily.   Iodoquinol-HC-Aloe Polysacch (ALCORTIN A) 1-2-1 % GEL as needed.    levothyroxine (SYNTHROID) 75 MCG tablet Take 1 tablet by mouth daily in the morning   pantoprazole (PROTONIX) 40 MG tablet Take 1 tablet by mouth once daily   quinapril (ACCUPRIL) 20 MG tablet Take 1 tablet (20 mg total) by mouth daily.   rosuvastatin (CRESTOR) 20 MG tablet Take 1 tablet (20 mg total) by mouth daily.   topiramate (TOPAMAX) 50 MG tablet Take 1 tablet (50 mg total) by mouth 2 (two) times  daily.   No facility-administered medications prior to visit.    Review of Systems  Constitutional: Negative for appetite change, chills and fever.  HENT: Negative.   Eyes: Negative.   Respiratory: Negative for chest tightness, shortness of breath and wheezing.   Cardiovascular: Negative for chest pain and palpitations.  Gastrointestinal: Negative for abdominal pain, nausea and vomiting.  Endocrine: Negative.   Allergic/Immunologic: Negative.   Hematological: Negative.   Psychiatric/Behavioral: Negative.        Objective    There were no vitals taken for this visit.    Physical Exam Vitals reviewed.  Constitutional:      Appearance: Normal appearance.  HENT:     Head: Normocephalic and atraumatic.     Right Ear: External ear normal.     Left Ear: External ear normal.  Eyes:     General: No scleral icterus.    Conjunctiva/sclera: Conjunctivae normal.  Cardiovascular:     Rate and Rhythm: Normal rate and regular rhythm.     Pulses: Normal pulses.     Heart sounds: Normal heart sounds.  Pulmonary:     Effort: Pulmonary effort is normal.     Breath sounds: Normal breath sounds.  Skin:    General: Skin is warm and dry.  Neurological:     General: No focal deficit present.     Mental Status:  He is alert and oriented to person, place, and time.  Psychiatric:        Mood and Affect: Mood normal.        Behavior: Behavior normal.        Thought Content: Thought content normal.        Judgment: Judgment normal.       No results found for any visits on 10/26/19.  Assessment & Plan     1. Stasis, venous   2. Leg wound, right, sequela Rewrap the leg with Unna boot.  Recheck in 3 to 4 days.  3. Edema of both legs Patient tries to wear support hose during the day.  4. Class 2 severe obesity due to excess calories with serious comorbidity and body mass index (BMI) of 36.0 to 36.9 in adult [name])    No follow-ups on file.      I, Wilhemena Durie, MD, have  reviewed all documentation for this visit. The documentation on 10/30/19 for the exam, diagnosis, procedures, and orders are all accurate and complete.    Medina Degraffenreid Cranford Mon, MD  [facility] [phone] (phone) [phone] (fax)  [place]
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
## 2019-10-26 ENCOUNTER — Ambulatory Visit (INDEPENDENT_AMBULATORY_CARE_PROVIDER_SITE_OTHER): Payer: PPO | Admitting: Family Medicine

## 2019-10-26 ENCOUNTER — Other Ambulatory Visit: Payer: Self-pay

## 2019-10-26 ENCOUNTER — Encounter: Payer: Self-pay | Admitting: Family Medicine

## 2019-10-26 VITALS — BP 158/81 | HR 76 | Temp 97.5°F | Ht 74.0 in | Wt 280.2 lb

## 2019-10-26 DIAGNOSIS — Z6836 Body mass index (BMI) 36.0-36.9, adult: Secondary | ICD-10-CM | POA: Diagnosis not present

## 2019-10-26 DIAGNOSIS — I878 Other specified disorders of veins: Secondary | ICD-10-CM

## 2019-10-26 DIAGNOSIS — R600 Localized edema: Secondary | ICD-10-CM | POA: Diagnosis not present

## 2019-10-26 DIAGNOSIS — S81801S Unspecified open wound, right lower leg, sequela: Secondary | ICD-10-CM | POA: Diagnosis not present

## 2019-10-29 ENCOUNTER — Other Ambulatory Visit: Payer: Self-pay

## 2019-10-29 ENCOUNTER — Encounter: Payer: Self-pay | Admitting: Cardiology

## 2019-10-29 ENCOUNTER — Ambulatory Visit (INDEPENDENT_AMBULATORY_CARE_PROVIDER_SITE_OTHER): Payer: PPO | Admitting: Adult Health

## 2019-10-29 ENCOUNTER — Encounter: Payer: Self-pay | Admitting: Adult Health

## 2019-10-29 ENCOUNTER — Ambulatory Visit: Payer: PPO | Admitting: Cardiology

## 2019-10-29 VITALS — BP 124/82 | HR 72 | Temp 97.7°F | Ht 74.0 in | Wt 273.0 lb

## 2019-10-29 VITALS — BP 117/74 | HR 66 | Resp 18

## 2019-10-29 DIAGNOSIS — L0390 Cellulitis, unspecified: Secondary | ICD-10-CM | POA: Diagnosis not present

## 2019-10-29 DIAGNOSIS — I447 Left bundle-branch block, unspecified: Secondary | ICD-10-CM

## 2019-10-29 DIAGNOSIS — I712 Thoracic aortic aneurysm, without rupture, unspecified: Secondary | ICD-10-CM

## 2019-10-29 DIAGNOSIS — I10 Essential (primary) hypertension: Secondary | ICD-10-CM

## 2019-10-29 DIAGNOSIS — I4819 Other persistent atrial fibrillation: Secondary | ICD-10-CM | POA: Diagnosis not present

## 2019-10-29 DIAGNOSIS — S81801S Unspecified open wound, right lower leg, sequela: Secondary | ICD-10-CM | POA: Insufficient documentation

## 2019-10-29 DIAGNOSIS — S8991XD Unspecified injury of right lower leg, subsequent encounter: Secondary | ICD-10-CM

## 2019-10-29 HISTORY — DX: Cellulitis, unspecified: L03.90

## 2019-10-29 MED ORDER — CEPHALEXIN 500 MG PO CAPS: 500.0000 mg | ORAL_CAPSULE | Freq: Three times a day (TID) | ORAL | 0 refills | Status: DC

## 2019-10-29 NOTE — Progress Notes (Signed)
Established patient visit  I,Jared Rice,acting as a scribe for [employer], FNP.,have documented all relevant documentation on the behalf of Jared Buffy, FNP,as directed by  Jared Buffy, FNP while in the presence of Jared Rice, [place].   Patient: Jared Rice   DOB: [birth year]   86 y.o. Male  MRN: [id number] Visit Date: 10/29/2019  Today's healthcare provider: [name], FNP   Chief Complaint  Patient presents with   Follow-up   Subjective    HPI Patient is here for a follow up on his right leg from 10/26/19 when he saw Dr. Rosanna Rice, he was originally seen 10/20/2019 when he struck his leg on the side of the toilet - right lower leg. He has chronic venous insufficiency which is delaying healing.   10/20/19 was wrapped with UNA boot  10/26/19 una boot was removed in office and new UNA boot was placed.    Patient reports that his second UNA boot became to tight and he removed it ad placed a large bandage over it.  He has not had warmth until yesterday and increased drainage from wound.   Of note first reading for blood pressure on low end for him today recheck was 117/74, he denies any dizziness, lightheaded, worsening shortness of breath, chest pain or abdominal pain or orthostatic symptoms. He does have a cardiology appointment today at 1 pm and plans to leave here and go there.  No recent changes in medication.denies any bleeding.   Denies any signs of infection.  Patient  denies any fever, body aches,chills, rash, chest pain, new or changing shortness of breath, nausea, vomiting, or diarrhea.   Denies dizziness, lightheadedness, pre syncopal or syncopal episodes.      Patient Active Problem List   Diagnosis Date Noted   Chronic fatigue 11/29/2015   BPH (benign prostatic hypertrophy) 02/22/2015   Gastrointestinal ulcer    History of peptic ulcer    Gastritis    Hematochezia    Acute gastric ulcer     Melena 11/12/2014   Malignant neoplasm of skin 09/21/2014   Cataract 09/21/2014   Colon polyp 09/21/2014   CAFL (chronic airflow limitation) (HCC) 09/21/2014   Diabetes ([place]) 09/21/2014   DD (diverticular disease) 09/21/2014   Benign essential tremor 09/21/2014   Personal history of tobacco use, presenting hazards to health 09/21/2014   Acid reflux 09/21/2014   Hemorrhoids 09/21/2014   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   HLD (hyperlipidemia) 09/21/2014   BP (high blood pressure) 09/21/2014   Hypothyroidism 09/21/2014   Lymphoma ([place]) 09/21/2014   Malaise and fatigue 09/21/2014   Mononeuritis 09/21/2014   Muscle ache 09/21/2014   Neuropathy 09/21/2014   Numbness and tingling 09/21/2014   Arthritis, degenerative 09/21/2014   Adiposity 09/21/2014   Awareness of heartbeats 09/21/2014   Borderline diabetes 09/21/2014   Acne erythematosa 09/21/2014   Rotator cuff syndrome 09/21/2014   Athlete's foot 09/21/2014   Has a tremor 09/21/2014   Stasis, venous 09/21/2014   Avitaminosis D 09/21/2014   Arthritis of knee, degenerative 11/16/2013   Bradycardia, sinus 12/31/2011   Left bundle branch block 12/31/2011   First degree AV block 12/31/2011   Edema of both legs 12/31/2011   COPD UNSPECIFIED 11/09/2009   HYPERTENSION, BENIGN 10/03/2009   SLEEP APNEA 10/03/2009   DYSPNEA 10/03/2009   CAD, NATIVE VESSEL 05/04/2009   Carotid disease, bilateral ([place]) 05/04/2009   Past Medical History:  Diagnosis Date   Anginal pain ([place])  Chronic airway obstruction, not elsewhere classified    Colon polyps    Coronary atherosclerosis of native coronary artery    nonobstructive   Diabetes mellitus without complication (HCC)    Essential hypertension, benign    Glucose intolerance (impaired glucose tolerance)    Hypothyroidism    Iron deficiency anemia    Knee pain, right    Occlusion and stenosis of carotid artery without mention of cerebral  infarction    wears compression stockings   Other dyspnea and respiratory abnormality    w/ pseuodowheeze resolves with purse lip manuever   Precordial pain    Shoulder pain, right    Tremor    Unspecified sleep apnea    Venous insufficiency    Wears dentures    full upper       Medications: Outpatient Medications Prior to Visit  Medication Sig   apixaban (ELIQUIS) 5 MG TABS tablet Take 1 tablet (5 mg total) by mouth 2 (two) times daily.   diphenhydramine-acetaminophen (TYLENOL PM) 25-500 MG TABS Take 2 tablets by mouth at bedtime as needed.   doxazosin (CARDURA) 8 MG tablet Take 1 tablet (8 mg total) by mouth daily.   fluconazole (DIFLUCAN) 200 MG tablet Take 1 Tab on Monday, Wednesday, and Friday, for two weeks DO NOT TAKE Crestor while taking this medication   furosemide (LASIX) 20 MG tablet Take 2 tablets by mouth daily   furosemide (LASIX) 40 MG tablet Take 1 tablet (40 mg total) by mouth daily.   Iodoquinol-HC-Aloe Polysacch (ALCORTIN A) 1-2-1 % GEL as needed.    levothyroxine (SYNTHROID) 75 MCG tablet Take 1 tablet by mouth daily in the morning   pantoprazole (PROTONIX) 40 MG tablet Take 1 tablet by mouth once daily   quinapril (ACCUPRIL) 20 MG tablet Take 1 tablet (20 mg total) by mouth daily.   rosuvastatin (CRESTOR) 20 MG tablet Take 1 tablet (20 mg total) by mouth daily.   topiramate (TOPAMAX) 50 MG tablet Take 1 tablet (50 mg total) by mouth 2 (two) times daily.   No facility-administered medications prior to visit.    Review of Systems  Constitutional: Negative.   Respiratory: Negative.   Cardiovascular: Negative.   Skin: Positive for color change and wound.    Last CBC Lab Results  Component Value Date   WBC 5.8 06/25/2019   HGB 12.0 (L) 06/25/2019   HCT 36.7 (L) 06/25/2019   MCV 93 06/25/2019   MCH 30.5 06/25/2019   RDW 13.7 06/25/2019   PLT 155 01/77/9390   Last metabolic panel Lab Results  Component Value Date   GLUCOSE 100 (H)  06/25/2019   NA 143 06/25/2019   K 4.3 06/25/2019   CL 108 (H) 06/25/2019   CO2 28 06/25/2019   BUN 15 06/25/2019   CREATININE 0.92 06/25/2019   GFRNONAA 76 06/25/2019   GFRAA 88 06/25/2019   CALCIUM 8.7 06/25/2019   PHOS 3.3 12/25/2016   PROT 5.6 (L) 06/25/2019   ALBUMIN 3.8 06/25/2019   LABGLOB 1.8 06/25/2019   AGRATIO 2.1 06/25/2019   BILITOT 0.3 06/25/2019   ALKPHOS 65 06/25/2019   AST 9 06/25/2019   ALT 6 06/25/2019   ANIONGAP 9 02/24/2019      Objective    BP 117/74 (BP Location: Left Arm, Patient Position: Sitting, Cuff Size: Large) Comment: right arm 99/ 59 he took medicine about 3 hours ago.   Pulse 66    Resp 18    SpO2 96%  BP Readings from Last 3  Encounters:  10/29/19 117/74  10/26/19 (!) 158/81  10/20/19 (!) 143/90     Vitals with BMI 10/29/2019 10/26/2019 10/20/2019  Height - 6\' 2"  6\' 2"   Weight - 280 lbs 3 oz 278 lbs  BMI - 60.10 93.23  Systolic 557 322 025  Diastolic 74 81 90  Pulse 66 76 82    Physical Exam Constitutional:      Appearance: Normal appearance.     Comments: Patient is alert and oriented and responsive to questions Engages in eye contact with provider.   HENT:     Head: Normocephalic and atraumatic.     Right Ear: External ear normal.     Left Ear: External ear normal.  Eyes:     Pupils: Pupils are equal, round, and reactive to light.  Neck:     Comments: Accompanied by his wife  Cardiovascular:     Rate and Rhythm: Normal rate and regular rhythm.     Pulses: Normal pulses.     Heart sounds: Normal heart sounds.  Pulmonary:     Effort: Pulmonary effort is normal.     Breath sounds: Normal breath sounds.  Abdominal:     General: There is no distension or abdominal bruit.     Palpations: Abdomen is soft. There is no hepatomegaly or pulsatile mass.     Tenderness: There is no abdominal tenderness. There is no guarding.  Musculoskeletal:     Cervical back: Normal range of motion and neck supple.  Skin:    General: Skin is warm.       Capillary Refill: Capillary refill takes less than 2 seconds.     Findings: Erythema present.          Comments: Right lower anterior leg with wound approximately 3 cm x 4 cm in size with new surrounding erythema localized to wound, and yellow drainage and scabbed over area centrally.  Mild tenderness to skin.  Mild warmth.   Neurological:     Mental Status: He is alert and oriented to person, place, and time.  Psychiatric:        Mood and Affect: Mood normal.        Behavior: Behavior normal.        Thought Content: Thought content normal.        Judgment: Judgment normal.       No results found for any visits on 10/29/19.  Assessment & Plan     Wound cellulitis- right lower leg anterior   Injury of right lower extremity, subsequent encounter  Blood pressure lower than his normal today, denies any other symptoms and exam is normal. Advised to lightly hydrate. He did just take his medications in the last three hours.  He also reports taking his medications as prescribed. Advised of RED flags and when to seek care immediately. Monitor at home.   No signs of infection or sepsis.   Orders Placed This Encounter  Procedures   CBC with Differential/Platelet   Given change in wound will start antibiotic to cover infection. pateint prefers not to have UNA boot replaced, wound was cleansed with saline and dressing was applied. Wound care instructions were provided and when to seek care for worsening symptoms.  Advised to elevate leg as much as possible.  Meds ordered this encounter  Medications   cephALEXin (KEFLEX) 500 MG capsule    Sig: Take 1 capsule (500 mg total) by mouth 3 (three) times daily.    Dispense:  30 capsule  Refill:  0    Return in about 1 week (around 11/05/2019), or if symptoms worsen or fail to improve, for at any time for any worsening symptoms, Go to Emergency room/ urgent care if worse.      The entirety of the information documented in the History  of Present Illness, Review of Systems and Physical Exam were personally obtained by me. Portions of this information were initially documented by the CMA and reviewed by me for thoroughness and accuracy.   [place] Jemery Stacey, FNP    Jared Rice, [place] [phone] (phone) [phone] (fax)  [place]

## 2019-10-29 NOTE — Patient Instructions (Addendum)
Cephalexin Tablets or Capsules What is this medicine? CEPHALEXIN (sef a LEX in) is a cephalosporin antibiotic. It treats some infections caused by bacteria. It will not work for colds, the flu, or other viruses. This medicine may be used for other purposes; ask your health care provider or pharmacist if you have questions. COMMON BRAND NAME(S): Biocef, Daxbia, Keflex, Keftab What should I tell my health care provider before I take this medicine? They need to know if you have any of these conditions:  kidney disease  stomach or intestine problems, especially colitis  an unusual or allergic reaction to cephalexin, other cephalosporins, penicillins, other antibiotics, medicines, foods, dyes or preservatives  pregnant or trying to get pregnant  breast-feeding How should I use this medicine? Take this drug by mouth. Take it as directed on the prescription label at the same time every day. You can take it with or without food. If it upsets your stomach, take it with food. Take all of this drug unless your health care provider tells you to stop it early. Keep taking it even if you think you are better. Talk to your health care provider about the use of this drug in children. While it may be prescribed for selected conditions, precautions do apply. Overdosage: If you think you have taken too much of this medicine contact a poison control center or emergency room at once.  Wound Care, Adult Taking care of your wound properly can help to prevent pain, infection, and scarring. It can also help your wound to heal more quickly. How to care for your wound Wound care      Follow instructions from your health care provider about how to take care of your wound. Make sure you: ? Wash your hands with soap and water before you change the bandage (dressing). If soap and water are not available, use hand sanitizer. ? Change your dressing as told by your health care provider. ? Leave stitches (sutures),  skin glue, or adhesive strips in place. These skin closures may need to stay in place for 2 weeks or longer. If adhesive strip edges start to loosen and curl up, you may trim the loose edges. Do not remove adhesive strips completely unless your health care provider tells you to do that.  Check your wound area every day for signs of infection. Check for: ? Redness, swelling, or pain. ? Fluid or blood. ? Warmth. ? Pus or a bad smell.  Ask your health care provider if you should clean the wound with mild soap and water. Doing this may include: ? Using a clean towel to pat the wound dry after cleaning it. Do not rub or scrub the wound. ? Applying a cream or ointment. Do this only as told by your health care provider. ? Covering the incision with a clean dressing.  Ask your health care provider when you can leave the wound uncovered.  Keep the dressing dry until your health care provider says it can be removed. Do not take baths, swim, use a hot tub, or do anything that would put the wound underwater until your health care provider approves. Ask your health care provider if you can take showers. You may only be allowed to take sponge baths. Medicines   If you were prescribed an antibiotic medicine, cream, or ointment, take or use the antibiotic as told by your health care provider. Do not stop taking or using the antibiotic even if your condition improves.  Take over-the-counter and prescription medicines only  as told by your health care provider. If you were prescribed pain medicine, take it 30 or more minutes before you do any wound care or as told by your health care provider. General instructions  Return to your normal activities as told by your health care provider. Ask your health care provider what activities are safe.  Do not scratch or pick at the wound.  Do not use any products that contain nicotine or tobacco, such as cigarettes and e-cigarettes. These may delay wound healing. If  you need help quitting, ask your health care provider.  Keep all follow-up visits as told by your health care provider. This is important.  Eat a diet that includes protein, vitamin A, vitamin C, and other nutrient-rich foods to help the wound heal. ? Foods rich in protein include meat, dairy, beans, nuts, and other sources. ? Foods rich in vitamin A include carrots and dark green, leafy vegetables. ? Foods rich in vitamin C include citrus, tomatoes, and other fruits and vegetables. ? Nutrient-rich foods have protein, carbohydrates, fat, vitamins, or minerals. Eat a variety of healthy foods including vegetables, fruits, and whole grains. Contact a health care provider if:  You received a tetanus shot and you have swelling, severe pain, redness, or bleeding at the injection site.  Your pain is not controlled with medicine.  You have redness, swelling, or pain around the wound.  You have fluid or blood coming from the wound.  Your wound feels warm to the touch.  You have pus or a bad smell coming from the wound.  You have a fever or chills.  You are nauseous or you vomit.  You are dizzy. Get help right away if:  You have a red streak going away from your wound.  The edges of the wound open up and separate.  Your wound is bleeding, and the bleeding does not stop with gentle pressure.  You have a rash.  You faint.  You have trouble breathing. Summary  Always wash your hands with soap and water before changing your bandage (dressing).  To help with healing, eat foods that are rich in protein, vitamin A, vitamin C, and other nutrients.  Check your wound every day for signs of infection. Contact your health care provider if you suspect that your wound is infected. This information is not intended to replace advice given to you by your health care provider. Make sure you discuss any questions you have with your health care provider. Document Revised: 08/31/2018 Document  Reviewed: 11/28/2015 Elsevier Patient Education  [place]. NOTE: This medicine is only for you. Do not share this medicine with others. What if I miss a dose? If you miss a dose, take it as soon as you can. If it is almost time for your next dose, take only that dose. Do not take double or extra doses. What may interact with this medicine?  probenecid  some other antibiotics This list may not describe all possible interactions. Give your health care provider a list of all the medicines, herbs, non-prescription drugs, or dietary supplements you use. Also tell them if you smoke, drink alcohol, or use illegal drugs. Some items may interact with your medicine. What should I watch for while using this medicine? Tell your doctor or health care provider if your symptoms do not begin to improve in a few days. This medicine may cause serious skin reactions. They can happen weeks to months after starting the medicine. Contact your health care provider right  away if you notice fevers or flu-like symptoms with a rash. The rash may be red or purple and then turn into blisters or peeling of the skin. Or, you might notice a red rash with swelling of the face, lips or lymph nodes in your neck or under your arms. Do not treat diarrhea with over the counter products. Contact your doctor if you have diarrhea that lasts more than 2 days or if it is severe and watery. If you have diabetes, you may get a false-positive result for sugar in your urine. Check with your doctor or health care provider. What side effects may I notice from receiving this medicine? Side effects that you should report to your doctor or health care professional as soon as possible:  allergic reactions like skin rash, itching or hives, swelling of the face, lips, or tongue  breathing problems  pain or trouble passing urine  redness, blistering, peeling or loosening of the skin, including inside the mouth  severe or watery  diarrhea  unusually weak or tired  yellowing of the eyes, skin Side effects that usually do not require medical attention (report to your doctor or health care professional if they continue or are bothersome):  gas or heartburn  genital or anal irritation  headache  joint or muscle pain  nausea, vomiting This list may not describe all possible side effects. Call your doctor for medical advice about side effects. You may report side effects to FDA at 1-800-FDA-1088. Where should I keep my medicine? Keep out of the reach of children and pets. Store at room temperature between 20 and 25 degrees C (68 and 77 degrees F). Throw away any unused drug after the expiration date. NOTE: This sheet is a summary. It may not cover all possible information. If you have questions about this medicine, talk to your doctor, pharmacist, or health care provider.  2020 Elsevier/Gold Standard (2018-12-18 11:27:00) Wound Infection A wound infection happens when germs start to grow in a wound. Germs that cause wound infections are most often bacteria. Other types of infections can occur as well. An infection can cause the wound to break open. Wound infections need treatment. If a wound infection is not treated, problems can happen. What are the causes?  Most often caused by germs (bacteria) that grow in a wound.  Other germs, such as yeast and funguses, can also cause wound infections. What increases the risk?  Having a weak body defense system (immune system).  Having diabetes.  Taking certain medicines (steroids) for a long time.  Smoking.  Being an older person.  Being overweight.  Taking certain medicines for cancer treatment. What are the signs or symptoms?  Having more redness, swelling, or pain at the wound site.  Having more blood or fluid at the wound site.  A bad smell coming from a wound or bandage (dressing).  Having a fever.  Feeling very tired.  Having warmth at or around  the wound.  Having pus at the wound site. How is this treated?  This condition is most often treated with an antibiotic medicine. ? The infection should improve 24-48 hours after you start antibiotics. ? After 24-48 hours, redness around the wound should stop spreading. The wound should also be less painful. Follow these instructions at home: Medicines  Take or apply over-the-counter and prescription medicines only as told by your doctor.  If you were prescribed an antibiotic medicine, take or apply it as told by your doctor. Do not stop using  the antibiotic even if you start to feel better. Wound care   Clean the wound each day, or as told by your doctor. ? Wash the wound with mild soap and water. ? Rinse the wound with water to remove all soap. ? Pat the wound dry with a clean towel. Do not rub it.  Follow instructions from your doctor about how to take care of your wound. Make sure you: ? Wash your hands with soap and water before and after you change your bandage. If you cannot use soap and water, use hand sanitizer. ? Change your bandage as told by your doctor. ? Leave stitches (sutures), skin glue, or skin tape (adhesive) strips in place if your wound has been closed. They may need to stay in place for 2 weeks or longer. If tape strips get loose and curl up, you may trim the loose edges. Do not remove tape strips completely unless your doctor says it is okay. Some wounds are left open to heal on their own.  Check your wound every day for signs of infection. Watch for: ? More redness, swelling, or pain. ? More fluid or blood. ? Warmth. ? Pus or a bad smell. General instructions  Keep the bandage dry until your doctor says it can be removed.  Do not take baths, swim, or use a hot tub until your doctor approves. Ask your doctor if you may take showers. You may only be allowed to take sponge baths.  Raise (elevate) the injured area above the level of your heart while you are  sitting or lying down.  Do not scratch or pick at the wound.  Keep all follow-up visits as told by your doctor. This is important. Contact a doctor if:  Medicine does not help your pain.  You have more redness, swelling, or pain around your wound.  You have more fluid or blood coming from your wound.  Your wound feels warm to the touch.  You have pus coming from your wound.  You notice a bad smell coming from your wound or your bandage.  Your wound that was closed breaks open. Get help right away if:  You have a red streak going away from your wound.  You have a fever. Summary  A wound infection happens when germs start to grow in a wound.  This condition is usually treated with an antibiotic medicine.  Follow instructions from your doctor about how to take care of your wound.  Contact a doctor if your wound infection does not start to get better in 24-48 hours, or your symptoms get worse.  Keep all follow-up visits as told by your doctor. This is important. This information is not intended to replace advice given to you by your health care provider. Make sure you discuss any questions you have with your health care provider. Document Revised: 12/23/2017 Document Reviewed: 12/23/2017 Elsevier Patient Education  [place]. Edema  Edema is when you have too much fluid in your body or under your skin. Edema may make your legs, feet, and ankles swell up. Swelling is also common in looser tissues, like around your eyes. This is a common condition. It gets more common as you get older. There are many possible causes of edema. Eating too much salt (sodium) and being on your feet or sitting for a long time can cause edema in your legs, feet, and ankles. Hot weather may make edema worse. Edema is usually painless. Your skin may look swollen or  shiny. Follow these instructions at home:  Keep the swollen body part raised (elevated) above the level of your heart when you are  sitting or lying down.  Do not sit still or stand for a long time.  Do not wear tight clothes. Do not wear garters on your upper legs.  Exercise your legs. This can help the swelling go down.  Wear elastic bandages or support stockings as told by your doctor.  Eat a low-salt (low-sodium) diet to reduce fluid as told by your doctor.  Depending on the cause of your swelling, you may need to limit how much fluid you drink (fluid restriction).  Take over-the-counter and prescription medicines only as told by your doctor. Contact a doctor if:  Treatment is not working.  You have heart, liver, or kidney disease and have symptoms of edema.  You have sudden and unexplained weight gain. Get help right away if:  You have shortness of breath or chest pain.  You cannot breathe when you lie down.  You have pain, redness, or warmth in the swollen areas.  You have heart, liver, or kidney disease and get edema all of a sudden.  You have a fever and your symptoms get worse all of a sudden. Summary  Edema is when you have too much fluid in your body or under your skin.  Edema may make your legs, feet, and ankles swell up. Swelling is also common in looser tissues, like around your eyes.  Raise (elevate) the swollen body part above the level of your heart when you are sitting or lying down.  Follow your doctor's instructions about diet and how much fluid you can drink (fluid restriction). This information is not intended to replace advice given to you by your health care provider. Make sure you discuss any questions you have with your health care provider. Document Revised: 05/16/2017 Document Reviewed: 05/31/2016 Elsevier Patient Education  2020 [employer].

## 2019-11-01 ENCOUNTER — Other Ambulatory Visit: Payer: Self-pay

## 2019-11-01 ENCOUNTER — Ambulatory Visit: Payer: PPO | Admitting: Pharmacist

## 2019-11-01 DIAGNOSIS — E119 Type 2 diabetes mellitus without complications: Secondary | ICD-10-CM

## 2019-11-01 DIAGNOSIS — G250 Essential tremor: Secondary | ICD-10-CM

## 2019-11-01 NOTE — Chronic Care Management (AMB) (Signed)
Chronic Care Management Pharmacy  Name: Jared Rice  MRN: [id number] DOB: [birth year]  Chief Complaint/ HPI  Jared Rice,  86 y.o. , male presents for their Follow-Up CCM visit with the clinical pharmacist via telephone due to COVID-19 Pandemic.  PCP : Jared Banana., MD  Their chronic conditions include: HF, DM  Office Visits: 6/4 Wound, [place], BP 117/74 P 66, UNA Boot, Keflex 552m tid x 10d 6/1 venostasis, [place], BP 158/81 P 76 Wt 280, rewrap 5/26 HTN, [place], BP 143/90 P 82 Wt 278, 2 - 3+ edema,   Consult Visit:  6/4 afib, [place] 124/82 P 72 Wt 273 BMI 378.6 normal systolic, OSA, CPAP nonadherent, CMalivasc 4, no need for NSR, insignificant 5/7 HTN, Jared Rice, d/c Topamax, start propranolol essential tremor, d/c Protonix?  Medications: Outpatient Encounter Medications as of 11/01/2019  Medication Sig  . apixaban (ELIQUIS) 5 MG TABS tablet Take 1 tablet (5 mg total) by mouth 2 (two) times daily.  . cephALEXin (KEFLEX) 500 MG capsule Take 1 capsule (500 mg total) by mouth 3 (three) times daily.  . diphenhydramine-acetaminophen (TYLENOL PM) 25-500 MG TABS Take 2 tablets by mouth at bedtime as needed.  . doxazosin (CARDURA) 8 MG tablet Take 1 tablet (8 mg total) by mouth daily.  . furosemide (LASIX) 20 MG tablet Take 2 tablets by mouth daily  . furosemide (LASIX) 40 MG tablet Take 1 tablet (40 mg total) by mouth daily.  .Jared BeaversPolysacch (ALCORTIN A) 1-2-1 % GEL as needed.   .Marland Kitchenlevothyroxine (SYNTHROID) 75 MCG tablet Take 1 tablet by mouth daily in the morning  . pantoprazole (PROTONIX) 40 MG tablet Take 1 tablet by mouth once daily  . quinapril (ACCUPRIL) 20 MG tablet Take 1 tablet (20 mg total) by mouth daily.  . rosuvastatin (CRESTOR) 20 MG tablet Take 1 tablet (20 mg total) by mouth daily.  .Marland Kitchentopiramate (TOPAMAX) 50 MG tablet Take 1 tablet (50 mg total) by mouth 2 (two) times daily.   No facility-administered encounter medications on file as  of 11/01/2019.     Stress: No Stress Concern Present  . Feeling of Stress : Not at all    Current Diagnosis/Assessment:  Goals Addressed            This Visit's Progress   . Pre-diabetes - goal A1c < 6.5%       CARE PLAN ENTRY (see longitudinal plan of care for additional care plan information)  Current Barriers:  . Pre-diabetes: type 2; complicated by chronic medical conditions including hypertension, hyperlipidemia Lab Results  Component Value Date   HGBA1C 6.3 (H) 06/25/2019 .   Lab Results  Component Value Date   CREATININE 0.92 06/25/2019   CREATININE 1.33 (H) 02/24/2019   CREATININE 1.03 06/03/2018 .   .Marland KitchenNo results found for: EGFR . Current antihyperglycemic regimen: None . Denies hypoglycemic symptoms, including dizziness, lightheadedness, shaking, sweating . Denies hyperglycemic symptoms, including polyuria, polydipsia, polyphagia, nocturia, blurred vision, neuropathy . Current exercise: significantly limited by mobility, uses cane . Current blood glucose readings: doesn't check . Cardiovascular risk reduction: o Current hypertensive regimen: quinapril 265mdaily o Current hyperlipidemia regimen: Crestor 1069maily o Current antiplatelet regimen: anticoagulant Eliquis 5mg63mily  Pharmacist Clinical Goal(s):  . OvMarland Kitchenr the next 90 days, patient will work with PharmD and primary care provider to address preventing pre-diabetes from becoming diabetes type 2  Interventions: . Comprehensive medication review performed, medication list updated in electronic medical record . Inter-disciplinary care  team collaboration (see longitudinal plan of care) . Counseled that although diabetes is on the diagnosis list, there is no indication that measurements warranted this diagnosis at any time in the past . Start Ozempic using this diagnosis, to promote weight loss  Patient Self Care Activities:  . Patient will make minor dietary changes and increase physical activity over the  next 90 days . Patient will contact provider with any episodes of hypoglycemia . Patient will report any questions or concerns to provider   Initial goal documentation       Diabetes   Recent Relevant Labs: Lab Results  Component Value Date/Time   HGBA1C 6.2 (A) 10/20/2019 01:43 PM   HGBA1C 6.3 (H) 06/25/2019 09:08 AM   HGBA1C 6.0 (H) 08/21/2016 08:16 AM    We discussed:  Weight loss with Ozempic  Plan  Start Ozempic 0.45m inj once weekly  Medication Management   Pt uses [place] medications Uses pill box? Yes Pt endorses 100% compliance  We discussed: Keflex doing well Propranolol for essential tremor not started based on last note, patient will discuss with PCP at visit next week Sleeps well   Plan  Continue current medication management strategy  Follow up: 3 month phone visit

## 2019-11-02 NOTE — Patient Instructions (Addendum)
Visit Information  Goals Addressed            This Visit's Progress   . Pre-diabetes - goal A1c < 6.5%       CARE PLAN ENTRY (see longitudinal plan of care for additional care plan information)  Current Barriers:  . Pre-diabetes: type 2; complicated by chronic medical conditions including hypertension, hyperlipidemia Lab Results  Component Value Date   HGBA1C 6.3 (H) 06/25/2019 .   Lab Results  Component Value Date   CREATININE 0.92 06/25/2019   CREATININE 1.33 (H) 02/24/2019   CREATININE 1.03 06/03/2018 .   [name] No results found for: EGFR . Current antihyperglycemic regimen: None . Denies hypoglycemic symptoms, including dizziness, lightheadedness, shaking, sweating . Denies hyperglycemic symptoms, including polyuria, polydipsia, polyphagia, nocturia, blurred vision, neuropathy . Current exercise: significantly limited by mobility, uses cane . Current blood glucose readings: doesn't check . Cardiovascular risk reduction: o Current hypertensive regimen: quinapril 51m daily o Current hyperlipidemia regimen: Crestor 130mdaily o Current antiplatelet regimen: anticoagulant Eliquis 42m73maily  Pharmacist Clinical Goal(s):  . OMarland Kitchener the next 90 days, patient will work with PharmD and primary care provider to address preventing pre-diabetes from becoming diabetes type 2  Interventions: . Comprehensive medication review performed, medication list updated in electronic medical record . Inter-disciplinary care team collaboration (see longitudinal plan of care) . Counseled that although diabetes is on the diagnosis list, there is no indication that measurements warranted this diagnosis at any time in the past . Start Ozempic using this diagnosis, to promote weight loss  Patient Self Care Activities:  . Patient will make minor dietary changes and increase physical activity over the next 90 days . Patient will contact provider with any episodes of hypoglycemia . Patient will report any  questions or concerns to provider   Initial goal documentation        Print copy of patient instructions provided.   Telephone follow up appointment with pharmacy team member scheduled for: 3 months  TedMilus HeightharmD, BCGOneidaTTS Clinical Pharmacist [phone] Mellitus and Skin Care Diabetes (diabetes mellitus) can lead to health problems over time, including skin problems. People with diabetes have a higher risk for many types of skin complications. This is because having poorly controlled blood sugar (glucose) levels can:  Damage nerves and blood vessels. This can result in decreased feeling in your legs and feet, which means you may not notice minor skin injuries that could lead to serious problems.  Reduce blood flow (circulation), which makes wounds heal more slowly and increases your risk of infection.  Cause areas of skin to become thick or discolored. What are some common skin conditions that affect people with diabetes? Diabetes often causes dry skin. It can also cause the skin on the feet to get thinner, break more easily, and heal more slowly. There are certain skin conditions that commonly affect people who have diabetes, such as:  Bacterial skin infections, such as styes, boils, infected hair follicles, and infections of the skin around the nails.  Fungal skin infections. These are most common in areas where skin rubs together, such as in the armpits or under the breasts.  Open sores, especially on the feet.  Tissue death (gangrene). This can happen on your feet if a serious infection does not heal properly. Gangrene can cause the need for a foot or leg to be surgically removed (amputated). Diabetes can also cause the skin to change. You may develop:  Dark, velvety markings  on the skin that usually appear on the face, neck, armpits, inner thighs, and groin (acanthosis nigricans). This typically affects people of African-American  and American-Indian descent.  Red, raised, scar-like tissue that may itch, feel painful, or develop into a wound (necrobiosis lipoidica).  Blisters on feet, toes, hands, or fingers.  Thickened, wax-like areas of skin that usually occur on the hands, forehead, or toes (digital sclerosis).  Brown or red ring-shaped or half-ring-shaped patches of skin on the ears or fingers (disseminated granuloma).  Pea-shaped yellow bumps that may be itchy and surrounded by a red ring (eruptive xanthomatosis). This usually affects the arms, feet, buttocks, and the top of the hands.  Round, discolored patches of tan skin that do not hurt or itch (diabetic dermopathy). These may look like age spots. What do I need to know about itchy skin? It is common for people with diabetes to have itchy skin caused by dryness. Frequent high blood glucose levels can cause itchiness, and poor circulation and certain skin infections can make dry, itchy skin worse. If you have itchy skin that is red or covered in a rash, this could be a sign of an allergic reaction to a medicine. If you have a rash or if your skin is very itchy, contact your health care provider. You may need help to manage your diabetes better, or you may need treatment for an infection. How can I prevent skin breakdown? When you have diabetes and you get a badly infected ulcer or sore that does not heal, your skin can break down, especially if you have poor circulation or are on bed rest. To prevent skin breakdown:  Keep your skin clean and dry. Wash your skin often. Do not use hot water.  Do not use any products that contain nicotine or tobacco, such as cigarettes and e-cigarettes. Smoking affects the body's ability to heal. If you need help quitting, ask your health care provider.  Check your skin every day for cuts, bruises, redness, blisters, or sores, especially on your feet. Tell your health care provider about any cuts, wounds, or sores you have,  especially if they are healing slowly.  If you are on bed rest, try to change positions often. What else do I need to know about taking care of my skin?   To relieve dry skin and itching: ? Limit baths and showers to 5-10 minutes. ? Bathe with lukewarm water instead of hot water. ? Use mild soap and gentle skin cleansers. Do not use soap that is perfumed or harsh or dries your skin. ? Put on lotion as soon as you finish bathing.  Make sure that your health care provider performs a visual foot exam at every medical visit.  Schedule a foot exam with your health care provider once every year. This exam includes an inspection of the structure and skin of your feet.  If you get a skin injury, such as a cut, blister, or sore, check the area every day for signs of infection. Check for: ? More redness, swelling, or pain. ? More fluid or blood. ? Warmth. ? Pus or a bad smell. Contact a health care provider if:  You develop a cut or sore, especially on your feet.  You develop signs of infection after a skin injury.  Your blood glucose level is higher than 240 mg/dL (13.3 mmol/L) for 2 days in a row.  You have itchy skin that develops redness or a rash.  You have discolored areas of skin.    You have areas where your skin is changing, such as thickening or appearing shiny. Summary  Diabetes (diabetes mellitus) can lead to health problems over time, including skin problems.  People with diabetes have a higher risk for many types of skin complications.  Check your skin every day for cuts, bruises, redness, blisters, or sores, especially on your feet.  Tell your health care provider about any cuts, wounds, or sores you have, especially if they are healing slowly. This information is not intended to replace advice given to you by your health care provider. Make sure you discuss any questions you have with your health care provider. Document Revised: 12/05/2016 Document Reviewed:  10/24/2015 Elsevier Patient Education  2020 Elsevier Inc.  

## 2019-11-08 DIAGNOSIS — E119 Type 2 diabetes mellitus without complications: Secondary | ICD-10-CM | POA: Diagnosis not present

## 2019-11-08 DIAGNOSIS — M79674 Pain in right toe(s): Secondary | ICD-10-CM | POA: Diagnosis not present

## 2019-11-08 DIAGNOSIS — M2041 Other hammer toe(s) (acquired), right foot: Secondary | ICD-10-CM | POA: Diagnosis not present

## 2019-11-08 DIAGNOSIS — B351 Tinea unguium: Secondary | ICD-10-CM | POA: Diagnosis not present

## 2019-11-08 DIAGNOSIS — M79675 Pain in left toe(s): Secondary | ICD-10-CM | POA: Diagnosis not present

## 2019-11-10 ENCOUNTER — Ambulatory Visit (INDEPENDENT_AMBULATORY_CARE_PROVIDER_SITE_OTHER): Payer: PPO | Admitting: Adult Health

## 2019-11-10 ENCOUNTER — Encounter: Payer: Self-pay | Admitting: Adult Health

## 2019-11-10 ENCOUNTER — Other Ambulatory Visit: Payer: Self-pay

## 2019-11-10 VITALS — BP 100/72 | HR 95 | Temp 97.1°F | Resp 14 | Wt 278.0 lb

## 2019-11-10 DIAGNOSIS — L0390 Cellulitis, unspecified: Secondary | ICD-10-CM

## 2019-11-10 DIAGNOSIS — I878 Other specified disorders of veins: Secondary | ICD-10-CM

## 2019-11-10 DIAGNOSIS — S81801S Unspecified open wound, right lower leg, sequela: Secondary | ICD-10-CM

## 2019-11-10 NOTE — Patient Instructions (Addendum)
Continue three more days on antibiotic and dress at home with Vaseline gauze.    Cellulitis, Adult  Cellulitis is a skin infection. The infected area is often warm, red, swollen, and sore. It occurs most often in the arms and lower legs. It is very important to get treated for this condition. What are the causes? This condition is caused by bacteria. The bacteria enter through a break in the skin, such as a cut, burn, insect bite, open sore, or crack. What increases the risk? This condition is more likely to occur in people who:  Have a weak body defense system (immune system).  Have open cuts, burns, bites, or scrapes on the skin.  Are older than 84 years of age.  Have a blood sugar problem (diabetes).  Have a long-lasting (chronic) liver disease (cirrhosis) or kidney disease.  Are very overweight (obese).  Have a skin problem, such as: ? Itchy rash (eczema). ? Slow movement of blood in the veins (venous stasis). ? Fluid buildup below the skin (edema).  Have been treated with high-energy rays (radiation).  Use IV drugs. What are the signs or symptoms? Symptoms of this condition include:  Skin that is: ? Red. ? Streaking. ? Spotting. ? Swollen. ? Sore or painful when you touch it. ? Warm.  A fever.  Chills.  Blisters. How is this diagnosed? This condition is diagnosed based on:  Medical history.  Physical exam.  Blood tests.  Imaging tests. How is this treated? Treatment for this condition may include:  Medicines to treat infections or allergies.  Home care, such as: ? Rest. ? Placing cold or warm cloths (compresses) on the skin.  Hospital care, if the condition is very bad. Follow these instructions at home: Medicines  Take over-the-counter and prescription medicines only as told by your doctor.  If you were prescribed an antibiotic medicine, take it as told by your doctor. Do not stop taking it even if you start to feel better. General  instructions   Drink enough fluid to keep your pee (urine) pale yellow.  Do not touch or rub the infected area.  Raise (elevate) the infected area above the level of your heart while you are sitting or lying down.  Place cold or warm cloths on the area as told by your doctor.  Keep all follow-up visits as told by your doctor. This is important. Contact a doctor if:  You have a fever.  You do not start to get better after 1-2 days of treatment.  Your bone or joint under the infected area starts to hurt after the skin has healed.  Your infection comes back. This can happen in the same area or another area.  You have a swollen bump in the area.  You have new symptoms.  You feel ill and have muscle aches and pains. Get help right away if:  Your symptoms get worse.  You feel very sleepy.  You throw up (vomit) or have watery poop (diarrhea) for a long time.  You see red streaks coming from the area.  Your red area gets larger.  Your red area turns dark in color. These symptoms may represent a serious problem that is an emergency. Do not wait to see if the symptoms will go away. Get medical help right away. Call your local emergency services (911 in the [place] Do not drive yourself to the hospital. Summary  Cellulitis is a skin infection. The area is often warm, red, swollen, and sore.  This  condition is treated with medicines, rest, and cold and warm cloths.  Take all medicines only as told by your doctor.  Tell your doctor if symptoms do not start to get better after 1-2 days of treatment. This information is not intended to replace advice given to you by your health care provider. Make sure you discuss any questions you have with your health care provider. Document Revised: 10/02/2017 Document Reviewed: 10/02/2017 Elsevier Patient Education  [place].

## 2019-11-10 NOTE — Progress Notes (Signed)
Established patient visit   Patient: Jared Rice   DOB: May 17, 1933   86 y.o. Male  MRN: [id number] Visit Date: 11/10/2019  Today's healthcare provider: [name], FNP   Jared Rice as a scribe for [place], FNP.,have documented all relevant documentation on the behalf of Jared Hampshire Rochell Puett, FNP,as directed by  Jared Buffy, FNP while in the presence of Jared Rice, Jared Rice. Chief Complaint  Patient presents with  . Wound Check   Subjective    Wound Check He was originally treated more than 14 days ago. Previous treatment included oral antibiotics. His temperature was unmeasured prior to arrival. There has been no drainage from the wound. The redness has improved. The swelling has improved. The pain has improved. He has no difficulty moving the affected extremity or digit.  Drainage and erythema has significantly improved. He has taken almost 10 days of keflex. Denies any pain. Edema is improved.  He has been taking Lasix 40 mg for the past three to five days prior to that he tells provider he was only taking 1/2 of a 40 mg tablet once daily.  Edema has improved.  Blood pressure is on low end today 100/72. He is asymptomatic and at cardiology visit  10/29/2019 recently he was 124/82.   Patient  denies any fever, body aches,chills, rash, chest pain, shortness of breath, nausea, vomiting, or diarrhea.  Denies dizziness, lightheadedness, pre syncopal or syncopal episodes.     Patient Active Problem List   Diagnosis Date Noted  . Wound cellulitis- right lower leg anterior  10/29/2019  . Injury of right leg 10/29/2019  . Chronic fatigue 11/29/2015  . BPH (benign prostatic hypertrophy) 02/22/2015  . Gastrointestinal ulcer   . History of peptic ulcer   . Gastritis   . Hematochezia   . Acute gastric ulcer   . Melena 11/12/2014  . Malignant neoplasm of skin 09/21/2014  . Cataract 09/21/2014  . Colon polyp  09/21/2014  . CAFL (chronic airflow limitation) ([place]) 09/21/2014  . Diabetes ([place]) 09/21/2014  . DD (diverticular disease) 09/21/2014  . Benign essential tremor 09/21/2014  . Personal history of tobacco use, presenting hazards to health 09/21/2014  . Acid reflux 09/21/2014  . Hemorrhoids 09/21/2014  . 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 HLD (hyperlipidemia) 09/21/2014  . BP (high blood pressure) 09/21/2014  . Hypothyroidism 09/21/2014  . Lymphoma ([place]) 09/21/2014  . Malaise and fatigue 09/21/2014  . Mononeuritis 09/21/2014  . Muscle ache 09/21/2014  . Neuropathy 09/21/2014  . Numbness and tingling 09/21/2014  . Arthritis, degenerative 09/21/2014  . Adiposity 09/21/2014  . Awareness of heartbeats 09/21/2014  . Borderline diabetes 09/21/2014  . Acne erythematosa 09/21/2014  . Rotator cuff syndrome 09/21/2014  . Athlete's foot 09/21/2014  . Has a tremor 09/21/2014  . Stasis, venous 09/21/2014  . Avitaminosis D 09/21/2014  . Arthritis of knee, degenerative 11/16/2013  . Bradycardia, sinus 12/31/2011  . Left bundle branch block 12/31/2011  . First degree AV block 12/31/2011  . Edema of both legs 12/31/2011  . COPD UNSPECIFIED 11/09/2009  . HYPERTENSION, BENIGN 10/03/2009  . SLEEP APNEA 10/03/2009  . DYSPNEA 10/03/2009  . CAD, NATIVE VESSEL 05/04/2009  . Carotid disease, bilateral ([place]) 05/04/2009   Past Medical History:  Diagnosis Date  . Anginal pain ([place])   . Chronic airway obstruction, not elsewhere classified   . Colon polyps   . Coronary atherosclerosis of native coronary artery    nonobstructive  . Diabetes mellitus  without complication ([place])   . Essential hypertension, benign   . Glucose intolerance (impaired glucose tolerance)   . Hypothyroidism   . Iron deficiency anemia   . Knee pain, right   . Occlusion and stenosis of carotid artery without mention of cerebral infarction    wears compression stockings  . Other dyspnea and respiratory abnormality    w/  pseuodowheeze resolves with purse lip manuever  . Precordial pain   . Shoulder pain, right   . Tremor   . Unspecified sleep apnea   . Venous insufficiency   . Wears dentures    full upper   Allergies  Allergen Reactions  . Dexilant [Dexlansoprazole] Rash  . Primidone Itching and Rash    rash       Medications: Outpatient Medications Prior to Visit  Medication Sig  . apixaban (ELIQUIS) 5 MG TABS tablet Take 1 tablet (5 mg total) by mouth 2 (two) times daily.  . cephALEXin (KEFLEX) 500 MG capsule Take 1 capsule (500 mg total) by mouth 3 (three) times daily.  . diphenhydramine-acetaminophen (TYLENOL PM) 25-500 MG TABS Take 2 tablets by mouth at bedtime as needed.  . doxazosin (CARDURA) 8 MG tablet Take 1 tablet (8 mg total) by mouth daily.  . furosemide (LASIX) 20 MG tablet Take 2 tablets by mouth daily  . Iodoquinol-HC-Aloe Polysacch (ALCORTIN A) 1-2-1 % GEL as needed.   [name] levothyroxine (SYNTHROID) 75 MCG tablet Take 1 tablet by mouth daily in the morning  . pantoprazole (PROTONIX) 40 MG tablet Take 1 tablet by mouth once daily  . quinapril (ACCUPRIL) 20 MG tablet Take 1 tablet (20 mg total) by mouth daily.  . rosuvastatin (CRESTOR) 20 MG tablet Take 1 tablet (20 mg total) by mouth daily.  [name] topiramate (TOPAMAX) 50 MG tablet Take 1 tablet (50 mg total) by mouth 2 (two) times daily.  . [DISCONTINUED] furosemide (LASIX) 40 MG tablet Take 1 tablet (40 mg total) by mouth daily.   No facility-administered medications prior to visit.    Review of Systems  Constitutional: Negative.   Respiratory: Negative.   Cardiovascular: Negative.   Skin: Positive for wound.  Neurological: Negative for dizziness, syncope, speech difficulty, light-headedness, numbness and headaches.    Last CBC Lab Results  Component Value Date   WBC 5.8 06/25/2019   HGB 12.0 (L) 06/25/2019   HCT 36.7 (L) 06/25/2019   MCV 93 06/25/2019   MCH 30.5 06/25/2019   RDW 13.7 06/25/2019   PLT 155 06/25/2019       Objective    BP 100/72   Pulse 95   Temp (!) 97.1 F (36.2 C) (Oral)   Resp 14   Wt 278 lb (126.1 kg)   SpO2 96%   BMI 35.69 kg/m     Physical Exam Constitutional:      Appearance: Normal appearance.     Comments: Patient is alert and oriented and responsive to questions Engages in eye contact with provider. Speaks in full sentences without any pauses without any shortness of breath or distress.    HENT:     Head: Normocephalic.  Eyes:     Extraocular Movements: Extraocular movements intact.     Conjunctiva/sclera: Conjunctivae normal.     Pupils: Pupils are equal, round, and reactive to light.  Cardiovascular:     Rate and Rhythm: Normal rate and regular rhythm.     Pulses: Normal pulses.     Heart sounds: Normal heart sounds.  Pulmonary:     Effort:  Pulmonary effort is normal.     Breath sounds: Normal breath sounds.  Musculoskeletal:        General: Normal range of motion.     Right lower leg: 2+ Edema present.     Left lower leg: 2+ Edema present.  Skin:    General: Skin is warm.     Capillary Refill: Capillary refill takes less than 2 seconds.     Coloration: Skin is not jaundiced or pale.     Findings: Bruising and wound present. No erythema (much improved - surronding erythema and purlent drainage resolving ), lesion or rash.     Comments: Slight drainage from wound - Improved. See media .   Neurological:     General: No focal deficit present.     Mental Status: He is alert and oriented to person, place, and time.  Psychiatric:        Mood and Affect: Mood normal.        Behavior: Behavior normal.    Media Information   Document Information  Photos  Right lower leg anterior towards medial side   11/10/2019 11:45  Attached To:  Office Visit on 11/10/19 with Darinda [place], Kelby Aline, FNP  Source Information  Camrie Stock, Kelby Aline, FNP  [facility]     No results found for any visits on 11/10/19.  Assessment & Plan     1.Leg wound -right  sequela.  Wound cellulitis- right lower leg anterior  He will complete 3 additional days of Keflex, continue applying Vaseline gauze bandage as instructed and clean with warm soapy water. Do not scrub. Change bandage every 3 days and sooner if soiled or any pain. Return to clinic if erythema or drainage returns or worsens or if edema worsens.   Will repeat labs given his fluctuation with Lasix dosage. It is ordered as Lasix 20 mg BID. Advised him to monitor if any swelling worsens( as it is improved today) call the office for return sooner. His wife has an appointment scheduled around one month and he is aware to return sooner if any need arises.  - CBC with Differential/Platelet - Comprehensive metabolic panel  Low end blood pressure readings - asymptomatic feels " fine" . Advised to change position slowly and keep a check on his blood pressure reading and if low or symptomatic call the office.   2. Stasis, venous Chronic.   Return in about 1 month (around 12/10/2019), or if symptoms worsen or fail to improve, for at any time for any worsening symptoms, Go to Emergency room/ urgent care if worse.      IWellington Hampshire Jahnessa Vanduyn, FNP, have reviewed all documentation for this visit. The documentation on 11/10/19 for the exam, diagnosis, procedures, and orders are all accurate and complete.    Jared Rice, [place] [phone] (phone) [phone] (fax)  [place]

## 2019-11-11 ENCOUNTER — Other Ambulatory Visit: Payer: Self-pay | Admitting: Family Medicine

## 2019-11-11 DIAGNOSIS — L0390 Cellulitis, unspecified: Secondary | ICD-10-CM | POA: Diagnosis not present

## 2019-11-11 DIAGNOSIS — E039 Hypothyroidism, unspecified: Secondary | ICD-10-CM

## 2019-11-11 MED ORDER — LEVOTHYROXINE SODIUM 75 MCG PO TABS: ORAL_TABLET | ORAL | 2 refills | Status: DC

## 2019-11-11 NOTE — Telephone Encounter (Signed)
[facility] faxed refill request for the following medications:  levothyroxine (SYNTHROID) 75 MCG tablet   Please advise. 

## 2019-11-11 NOTE — Telephone Encounter (Signed)
Refill sent to pharmacy.   

## 2019-11-12 ENCOUNTER — Telehealth: Payer: Self-pay | Admitting: *Deleted

## 2019-11-12 LAB — COMPREHENSIVE METABOLIC PANEL
ALT: 5 IU/L (ref 0–44)
AST: 8 IU/L (ref 0–40)
Albumin/Globulin Ratio: 1.9 (ref 1.2–2.2)
Albumin: 3.9 g/dL (ref 3.6–4.6)
Alkaline Phosphatase: 66 IU/L (ref 48–121)
BUN/Creatinine Ratio: 16 (ref 10–24)
BUN: 21 mg/dL (ref 8–27)
Bilirubin Total: 0.4 mg/dL (ref 0.0–1.2)
CO2: 27 mmol/L (ref 20–29)
Calcium: 8.6 mg/dL (ref 8.6–10.2)
Chloride: 102 mmol/L (ref 96–106)
Creatinine, Ser: 1.32 mg/dL — ABNORMAL HIGH (ref 0.76–1.27)
GFR calc Af Amer: 56 mL/min/{1.73_m2} — ABNORMAL LOW (ref >59)
GFR calc non Af Amer: 49 mL/min/{1.73_m2} — ABNORMAL LOW (ref >59)
Globulin, Total: 2.1 g/dL (ref 1.5–4.5)
Glucose: 94 mg/dL (ref 65–99)
Potassium: 4.4 mmol/L (ref 3.5–5.2)
Sodium: 141 mmol/L (ref 134–144)
Total Protein: 6 g/dL (ref 6.0–8.5)

## 2019-11-12 LAB — CBC WITH DIFFERENTIAL/PLATELET
Basophils Absolute: 0 10*3/uL (ref 0.0–0.2)
Basos: 1 %
EOS (ABSOLUTE): 0.2 10*3/uL (ref 0.0–0.4)
Eos: 3 %
Hematocrit: 37.7 % (ref 37.5–51.0)
Hemoglobin: 11.6 g/dL — ABNORMAL LOW (ref 13.0–17.7)
Immature Grans (Abs): 0 10*3/uL (ref 0.0–0.1)
Immature Granulocytes: 0 %
Lymphocytes Absolute: 1 10*3/uL (ref 0.7–3.1)
Lymphs: 15 %
MCH: 29.2 pg (ref 26.6–33.0)
MCHC: 30.8 g/dL — ABNORMAL LOW (ref 31.5–35.7)
MCV: 95 fL (ref 79–97)
Monocytes Absolute: 0.6 10*3/uL (ref 0.1–0.9)
Monocytes: 9 %
Neutrophils Absolute: 4.6 10*3/uL (ref 1.4–7.0)
Neutrophils: 72 %
Platelets: 160 10*3/uL (ref 150–450)
RBC: 3.97 x10E6/uL — ABNORMAL LOW (ref 4.14–5.80)
RDW: 13.7 % (ref 11.6–15.4)
WBC: 6.4 10*3/uL (ref 3.4–10.8)

## 2019-11-12 NOTE — Telephone Encounter (Signed)
Kingsley Callander called from [employer] for okay to switch manufacturer of levothyroxine to [employer]. Faythe Ghee was given.

## 2019-11-12 NOTE — Progress Notes (Signed)
WBC normal.  On keflex for leg wound. Kidney function stable compared to previous labs. Keep follow up as recommended.

## 2019-11-15 DIAGNOSIS — M1711 Unilateral primary osteoarthritis, right knee: Secondary | ICD-10-CM | POA: Diagnosis not present

## 2019-11-16 ENCOUNTER — Telehealth: Payer: Self-pay

## 2019-11-16 DIAGNOSIS — K253 Acute gastric ulcer without hemorrhage or perforation: Secondary | ICD-10-CM

## 2019-11-16 MED ORDER — PANTOPRAZOLE SODIUM 40 MG PO TBEC: 40.0000 mg | DELAYED_RELEASE_TABLET | Freq: Every day | ORAL | 0 refills | Status: DC

## 2019-11-16 NOTE — Telephone Encounter (Signed)
Patient called wanting to know if he should continue taking his Pantoprazole 40 MG one tablet daily. I told patient that the last time he was seen by Dr. Allen Norris was on 11/25/2017 and according to that office visit, due to history of GERD and ulcers, he would need to take the pantoprazole. However, patient wanted me to ask if he should be taking it for the rest of his life. I told patient that I would have to ask Dr. Allen Norris and once I have an answer, that I would call him and let him know. In the meantime, I told patient that I would send him a refill for one month. Patient agreed and had no further questions.

## 2019-11-18 NOTE — Telephone Encounter (Signed)
Let the patient know that he can try and stop it and see if his symptoms return.

## 2019-11-18 NOTE — Telephone Encounter (Signed)
Called patient and left him a detailed message letting him know that Dr. Allen Norris recommended for him to stop taking his Pantoprazole and if symptoms return, then to restart it. Patient was informed to call us back if he had further questions.

## 2019-11-22 ENCOUNTER — Encounter: Payer: Self-pay | Admitting: Adult Health

## 2019-11-22 ENCOUNTER — Other Ambulatory Visit: Payer: Self-pay

## 2019-11-22 ENCOUNTER — Ambulatory Visit (INDEPENDENT_AMBULATORY_CARE_PROVIDER_SITE_OTHER): Payer: PPO | Admitting: Adult Health

## 2019-11-22 VITALS — BP 110/68 | HR 89 | Temp 97.1°F | Resp 16 | Wt 268.0 lb

## 2019-11-22 DIAGNOSIS — R82998 Other abnormal findings in urine: Secondary | ICD-10-CM | POA: Diagnosis not present

## 2019-11-22 DIAGNOSIS — W19XXXA Unspecified fall, initial encounter: Secondary | ICD-10-CM

## 2019-11-22 DIAGNOSIS — S81801S Unspecified open wound, right lower leg, sequela: Secondary | ICD-10-CM | POA: Diagnosis not present

## 2019-11-22 DIAGNOSIS — S51812D Laceration without foreign body of left forearm, subsequent encounter: Secondary | ICD-10-CM | POA: Insufficient documentation

## 2019-11-22 DIAGNOSIS — R350 Frequency of micturition: Secondary | ICD-10-CM | POA: Diagnosis not present

## 2019-11-22 DIAGNOSIS — S51819A Laceration without foreign body of unspecified forearm, initial encounter: Secondary | ICD-10-CM | POA: Diagnosis not present

## 2019-11-22 LAB — POCT URINALYSIS DIPSTICK
Glucose, UA: NEGATIVE
Ketones, UA: NEGATIVE
Nitrite, UA: NEGATIVE
Protein, UA: POSITIVE — AB
Spec Grav, UA: 1.01 (ref 1.010–1.025)
Urobilinogen, UA: 4 E.U./dL — AB
pH, UA: 7 (ref 5.0–8.0)

## 2019-11-22 MED ORDER — AMOXICILLIN-POT CLAVULANATE 500-125 MG PO TABS: 1.0000 | ORAL_TABLET | Freq: Three times a day (TID) | ORAL | 0 refills | Status: DC

## 2019-11-22 NOTE — Progress Notes (Signed)
Established patient visit   Patient: Jared Rice   DOB: 07/10/1932   86 y.o. Male  MRN: [id number] Visit Date: 11/22/2019  Today's healthcare provider: [name], FNP   Chief Complaint : Urinary symptoms  Subjective    HPI Patient comes in office today to discuss concerns of a possible urinary infection. Patient reports for the past 2 days he has had tremors, patient states that this a common sign that his body is fighting of urinary infection and states that he takes prescription Cipro for maintenance of symptoms. Patient reports frequency and urgency associated with tremors. His leg wound is healing , he has had a " slow fall he says  "he has a skin tear on his keft arm now"  [name] his wide called 911 to get him up. He fell this past Saturday morning. Denies any head injury. Denies any loss of conciousness.  slow fall as he lost his balance.  He denied any dizziness lightheadedness or palpitations or anything leading up to the fall he felt fell getting out of the bed.  911 EMS came out to evaluate him and he did not need to go to the hospital at that time. He has no other new symptoms since that time he reports.  Denies any syncopal or presyncopal episodes.  He had cipro at home and tsrated taking it when he got to having UTI symptoms burning and frequency, he started feeling better, better and he only had 3 days worth that he took and finished about 2 days ago and feels the symptoms are worsening.  He always gets tremors whenever he has a urinary tract infection and this was his first thing that he noticed. No back pain, fever chills nausea vomiting or associated symptoms.  Denies any rectal pain and pressure.  His urinary stream has no change he reports.   His right anterior lower leg wound is healing and he denies any new or changing symptoms since last seen other than improvement.  Is any leg pain.  His edema is actually under control today.  Patient  denies any  fever, body aches,chills, rash, chest pain, shortness of breath, nausea, vomiting, or diarrhea.  Denies dizziness, lightheadedness, pre syncopal or syncopal episodes.    Patient Active Problem List   Diagnosis Date Noted  . Urinary frequency 11/22/2019  . Leukocytes in urine 11/22/2019  . Skin tear of forearm without complication, initial encounter- left 11/22/2019  . Wound cellulitis- right lower leg anterior  10/29/2019  . Leg wound, right, sequela 10/29/2019  . Chronic fatigue 11/29/2015  . BPH (benign prostatic hypertrophy) 02/22/2015  . Gastrointestinal ulcer   . History of peptic ulcer   . Gastritis   . Hematochezia   . Acute gastric ulcer   . Melena 11/12/2014  . Malignant neoplasm of skin 09/21/2014  . Cataract 09/21/2014  . Colon polyp 09/21/2014  . CAFL (chronic airflow limitation) ([place]) 09/21/2014  . Diabetes ([place]) 09/21/2014  . DD (diverticular disease) 09/21/2014  . Benign essential tremor 09/21/2014  . Personal history of tobacco use, presenting hazards to health 09/21/2014  . Acid reflux 09/21/2014  . Hemorrhoids 09/21/2014  . 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 HLD (hyperlipidemia) 09/21/2014  . BP (high blood pressure) 09/21/2014  . Hypothyroidism 09/21/2014  . Lymphoma ([place]) 09/21/2014  . Malaise and fatigue 09/21/2014  . Mononeuritis 09/21/2014  . Muscle ache 09/21/2014  . Neuropathy 09/21/2014  . Numbness and tingling 09/21/2014  . Arthritis, degenerative 09/21/2014  .  Adiposity 09/21/2014  . Awareness of heartbeats 09/21/2014  . Borderline diabetes 09/21/2014  . Acne erythematosa 09/21/2014  . Rotator cuff syndrome 09/21/2014  . Athlete's foot 09/21/2014  . Has a tremor 09/21/2014  . Stasis, venous 09/21/2014  . Avitaminosis D 09/21/2014  . Arthritis of knee, degenerative 11/16/2013  . Bradycardia, sinus 12/31/2011  . Left bundle branch block 12/31/2011  . First degree AV block 12/31/2011  . Edema of both legs 12/31/2011  . COPD UNSPECIFIED  11/09/2009  . HYPERTENSION, BENIGN 10/03/2009  . SLEEP APNEA 10/03/2009  . DYSPNEA 10/03/2009  . CAD, NATIVE VESSEL 05/04/2009  . Carotid disease, bilateral ([place]) 05/04/2009   Past Medical History:  Diagnosis Date  . Anginal pain ([place])   . Chronic airway obstruction, not elsewhere classified   . Colon polyps   . Coronary atherosclerosis of native coronary artery    nonobstructive  . Diabetes mellitus without complication ([place])   . Essential hypertension, benign   . Glucose intolerance (impaired glucose tolerance)   . Hypothyroidism   . Iron deficiency anemia   . Knee pain, right   . Occlusion and stenosis of carotid artery without mention of cerebral infarction    wears compression stockings  . Other dyspnea and respiratory abnormality    w/ pseuodowheeze resolves with purse lip manuever  . Precordial pain   . Shoulder pain, right   . Tremor   . Unspecified sleep apnea   . Venous insufficiency   . Wears dentures    full upper   Social History   Tobacco Use  . Smoking status: Former Smoker    Packs/day: 2.00    Years: 31.00    Pack years: 62.00    Types: Cigarettes    Quit date: 12/24/1980    Years since quitting: 38.9  . Smokeless tobacco: Never Used  . Tobacco comment: quit in 1982  Vaping Use  . Vaping Use: Never used  Substance Use Topics  . Alcohol use: No    Alcohol/week: 0.0 standard drinks  . Drug use: No   Allergies  Allergen Reactions  . Dexilant [Dexlansoprazole] Rash  . Primidone Itching and Rash    rash       Medications: Outpatient Medications Prior to Visit  Medication Sig  . apixaban (ELIQUIS) 5 MG TABS tablet Take 1 tablet (5 mg total) by mouth 2 (two) times daily.  . cephALEXin (KEFLEX) 500 MG capsule Take 1 capsule (500 mg total) by mouth 3 (three) times daily.  . diphenhydramine-acetaminophen (TYLENOL PM) 25-500 MG TABS Take 2 tablets by mouth at bedtime as needed.  . doxazosin (CARDURA) 8 MG tablet Take 1 tablet (8 mg total) by mouth  daily.  . furosemide (LASIX) 20 MG tablet Take 2 tablets by mouth daily  . Iodoquinol-HC-Aloe Polysacch (ALCORTIN A) 1-2-1 % GEL as needed.   [name] levothyroxine (SYNTHROID) 75 MCG tablet Take 1 tablet by mouth daily in the morning  . pantoprazole (PROTONIX) 40 MG tablet Take 1 tablet (40 mg total) by mouth daily.  . quinapril (ACCUPRIL) 20 MG tablet Take 1 tablet (20 mg total) by mouth daily.  . rosuvastatin (CRESTOR) 20 MG tablet Take 1 tablet (20 mg total) by mouth daily.  [name] topiramate (TOPAMAX) 50 MG tablet Take 1 tablet (50 mg total) by mouth 2 (two) times daily.   No facility-administered medications prior to visit.    Review of Systems  Constitutional: Positive for fatigue.  Gastrointestinal: Negative.   Genitourinary: Positive for frequency and urgency. Negative for  decreased urine volume, flank pain and hematuria.  Neurological: Positive for tremors, weakness and light-headedness.  Hematological: Bruises/bleeds easily.    Last CBC Lab Results  Component Value Date   WBC 6.4 11/11/2019   HGB 11.6 (L) 11/11/2019   HCT 37.7 11/11/2019   MCV 95 11/11/2019   MCH 29.2 11/11/2019   RDW 13.7 11/11/2019   PLT 160 58/01/9832   Last metabolic panel Lab Results  Component Value Date   GLUCOSE 94 11/11/2019   NA 141 11/11/2019   K 4.4 11/11/2019   CL 102 11/11/2019   CO2 27 11/11/2019   BUN 21 11/11/2019   CREATININE 1.32 (H) 11/11/2019   GFRNONAA 49 (L) 11/11/2019   GFRAA 56 (L) 11/11/2019   CALCIUM 8.6 11/11/2019   PHOS 3.3 12/25/2016   PROT 6.0 11/11/2019   ALBUMIN 3.9 11/11/2019   LABGLOB 2.1 11/11/2019   AGRATIO 1.9 11/11/2019   BILITOT 0.4 11/11/2019   ALKPHOS 66 11/11/2019   AST 8 11/11/2019   ALT 5 11/11/2019   ANIONGAP 9 02/24/2019      Objective    BP 110/68   Pulse 89   Temp (!) 97.1 F (36.2 C) (Oral)   Resp 16   Wt 268 lb (121.6 kg)   SpO2 97%   BMI 34.41 kg/m  BP Readings from Last 3 Encounters:  11/22/19 110/68  11/10/19 100/72  10/29/19  124/82      Physical Exam Constitutional:      Appearance: He is obese. He is not ill-appearing or diaphoretic.  HENT:     Head: Normocephalic and atraumatic.     Right Ear: External ear normal.     Left Ear: External ear normal.     Nose: Nose normal.     Mouth/Throat:     Mouth: Mucous membranes are moist.  Eyes:     Conjunctiva/sclera: Conjunctivae normal.  Cardiovascular:     Rate and Rhythm: Normal rate and regular rhythm.     Pulses: Normal pulses.     Heart sounds: Normal heart sounds. No murmur heard.  No friction rub. No gallop.   Pulmonary:     Effort: Pulmonary effort is normal.     Breath sounds: Normal breath sounds.  Abdominal:     General: There is no distension.     Palpations: Abdomen is soft.     Tenderness: There is no abdominal tenderness.  Musculoskeletal:        General: Normal range of motion.     Left shoulder: Normal.     Right upper arm: Normal.     Left upper arm: Normal.     Right elbow: Normal.     Right forearm: Normal.     Left forearm: Laceration (skin tear only as pictured ) present.     Cervical back: Normal range of motion and neck supple. No rigidity or tenderness.     Right lower leg: 1+ Edema (improved bilaterally 1+ no pitting ) present.     Left lower leg: 1+ Edema present.       Legs:     Comments: Left elbow with significant bruising, no pain with palpation, no warmth, no drainage. Skin tear present left  upper arm as pictured.No bone tenderness.  Radial pulse 2 +/ Range of motion is normal in right and left upper extremity.   Right lower extremity would is healing well, no erythema, no warmth, pain or drainage.   Skin:    General: Skin is warm.  Capillary Refill: Capillary refill takes less than 2 seconds.     Findings: Abrasion, bruising (see media left elbow/ arm / skin tear from fall ), erythema, signs of injury and wound present.     Comments: Senile purpura bilateral arms as well.   Neurological:     General: No  focal deficit present.     Mental Status: He is alert and oriented to person, place, and time.     Cranial Nerves: No cranial nerve deficit.     Sensory: No sensory deficit.     Motor: No weakness.     Coordination: Coordination normal.     Gait: Gait normal.     Deep Tendon Reflexes: Reflexes normal.  Psychiatric:        Mood and Affect: Mood normal.        Behavior: Behavior normal.        Thought Content: Thought content normal.        Judgment: Judgment normal.     Media Information   Document Information  Photos  Right lower anterior leg wound   11/22/2019 11:28  Attached To:  Office Visit on 11/22/19 with Raad Clayson, Kelby Aline, FNP  Source Information  Chantae Soo, Kelby Aline, FNP  [facility]   Media Information   Document Information  Photos  Left arm posterior and elbow after fall   11/22/2019 11:28  Attached To:  Office Visit on 11/22/19 with Louella Medaglia, Kelby Aline, FNP  Source Information  Casie Sturgeon, Kelby Aline, FNP  [facility]   No hematoma. Results for orders placed or performed in visit on 11/22/19  POCT urinalysis dipstick  Result Value Ref Range   Color, UA dark yellow    Clarity, UA cloudy    Glucose, UA Negative Negative   Bilirubin, UA small    Ketones, UA negative    Spec Grav, UA 1.010 1.010 - 1.025   Blood, UA large    pH, UA 7.0 5.0 - 8.0   Protein, UA Positive (A) Negative   Urobilinogen, UA 4.0 (A) 0.2 or 1.0 E.U./dL   Nitrite, UA negative    Leukocytes, UA Large (3+) (A) Negative   Appearance     Odor      Assessment & Plan     The primary encounter diagnosis was Urinary frequency. Diagnoses of Leukocytes in urine, Skin tear of forearm without complication, initial encounter- left, and Leg wound, right, sequela were also pertinent to this visit.  Urinary tract infection per point-of-care test for urine, will treat with Augmentin as below and send urine for culture will call if need to change antibiotic he will  not continue the Cipro as he does not have any more left.  He does have a large amount of leukocytes in his urine today.  No signs of pyelonephritis or any hematuria. Meds ordered this encounter  Medications  . amoxicillin-clavulanate (AUGMENTIN) 500-125 MG tablet    Sig: Take 1 tablet (500 mg total) by mouth 3 (three) times daily for 7 days.    Dispense:  21 tablet    Refill:  0    Right lower leg wound is healing well, was treated with Keflex here recently and wound has now closed with Vaseline gauze and wound care.  He is no longer covering the area as it is closed and no longer tender will continue to monitor and he will return to the office should anything worsen.  His creatinine was slightly elevated 2 weeks ago while on the Keflex,  he has a follow-up appointment with Dr. Rosanna Randy and may need to recheck a CMP at that time.  Fall was thought to be from getting up from his bed, denied any hypotensive episodes however his blood pressures been on the low end last few times he has been and and will continue to need to be monitored, cardiology was happy with his blood pressure at last visit as well which was recently.  Patient denied any dizziness lightheadedness or signs of orthostatic changes.  His left arm skin tear was cleaned and dressed with a Vaseline gauze and gauze on top of it with a wrap secured with medical tape.  Skin care instructions were given and after visit summary and when to return to the office should any symptoms of infection or worsening swelling or bruising occur.  Patient is significantly bruised and should continue to improve each day and if anything is worsening emergent signs and symptoms were reviewed and patient and his wife verbalized understanding and appreciation of care.  Advised patient call the office or your primary care doctor for an appointment if no improvement within 72 hours or if any symptoms change or worsen at any time  Advised ER or urgent Care if after  hours or on weekend. Call 911 for emergency symptoms at any time.Patinet verbalized understanding of all instructions given/reviewed and treatment plan and has no further questions or concerns at this time.       Return in about 5 days (around 11/27/2019), or if symptoms worsen or fail to improve, for at any time for any worsening symptoms.     I[place]ellington [place] Janele Lague, FNP, have reviewed all documentation for this visit. The documentation on 11/22/19 for the exam, diagnosis, procedures, and orders are all accurate and complete.   [name], [place] [phone] (phone) [phone] (fax)  [place]

## 2019-11-22 NOTE — Patient Instructions (Signed)
Urinary Tract Infection, Adult A urinary tract infection (UTI) is an infection of any part of the urinary tract. The urinary tract includes:  The kidneys.  The ureters.  The bladder.  The urethra. These organs make, store, and get rid of pee (urine) in the body. What are the causes? This is caused by germs (bacteria) in your genital area. These germs grow and cause swelling (inflammation) of your urinary tract. What increases the risk? You are more likely to develop this condition if:  You have a small, thin tube (catheter) to drain pee.  You cannot control when you pee or poop (incontinence).  You are male, and: ? You use these methods to prevent pregnancy:  A medicine that kills sperm (spermicide).  A device that blocks sperm (diaphragm). ? You have low levels of a male hormone (estrogen). ? You are pregnant.  You have genes that add to your risk.  You are sexually active.  You take antibiotic medicines.  You have trouble peeing because of: ? A prostate that is bigger than normal, if you are male. ? A blockage in the part of your body that drains pee from the bladder (urethra). ? A kidney stone. ? A nerve condition that affects your bladder (neurogenic bladder). ? Not getting enough to drink. ? Not peeing often enough.  You have other conditions, such as: ? Diabetes. ? A weak disease-fighting system (immune system). ? Sickle cell disease. ? Gout. ? Injury of the spine. What are the signs or symptoms? Symptoms of this condition include:  Needing to pee right away (urgently).  Peeing often.  Peeing small amounts often.  Pain or burning when peeing.  Blood in the pee.  Pee that smells bad or not like normal.  Trouble peeing.  Pee that is cloudy.  Fluid coming from the vagina, if you are male.  Pain in the belly or lower back. Other symptoms include:  Throwing up (vomiting).  No urge to eat.  Feeling mixed up (confused).  Being tired  and grouchy (irritable).  A fever.  Watery poop (diarrhea). How is this treated? This condition may be treated with:  Antibiotic medicine.  Other medicines.  Drinking enough water. Follow these instructions at home:  Medicines  Take over-the-counter and prescription medicines only as told by your doctor.  If you were prescribed an antibiotic medicine, take it as told by your doctor. Do not stop taking it even if you start to feel better. General instructions  Make sure you: ? Pee until your bladder is empty. ? Do not hold pee for a long time. ? Empty your bladder after sex. ? Wipe from front to back after pooping if you are a male. Use each tissue one time when you wipe.  Drink enough fluid to keep your pee pale yellow.  Keep all follow-up visits as told by your doctor. This is important. Contact a doctor if:  You do not get better after 1-2 days.  Your symptoms go away and then come back. Get help right away if:  You have very bad back pain.  You have very bad pain in your lower belly.  You have a fever.  You are sick to your stomach (nauseous).  You are throwing up. Summary  A urinary tract infection (UTI) is an infection of any part of the urinary tract.  This condition is caused by germs in your genital area.  There are many risk factors for a UTI. These include having a small, thin   tube to drain pee and not being able to control when you pee or poop.  Treatment includes antibiotic medicines for germs.  Drink enough fluid to keep your pee pale yellow. This information is not intended to replace advice given to you by your health care provider. Make sure you discuss any questions you have with your health care provider. Document Revised: 04/30/2018 Document Reviewed: 11/20/2017 Elsevier Patient Education  [place]. Amoxicillin; Clavulanic Acid Tablets What is this medicine? AMOXICILLIN; CLAVULANIC ACID (a mox i SIL in; KLAV yoo lan ic AS  id) is a penicillin antibiotic. It treats some infections caused by bacteria. It will not work for colds, the flu, or other viruses. This medicine may be used for other purposes; ask your health care provider or pharmacist if you have questions. COMMON BRAND NAME(S): Augmentin What should I tell my health care provider before I take this medicine? They need to know if you have any of these conditions:  bowel disease, like colitis  kidney disease  liver disease  mononucleosis  an unusual or allergic reaction to amoxicillin, penicillin, cephalosporin, other antibiotics, clavulanic acid, other medicines, foods, dyes, or preservatives  pregnant or trying to get pregnant  breast-feeding How should I use this medicine? Take this drug by mouth. Take it as directed on the prescription label at the same time every day. Take it with food at the start of a meal or snack. Take all of this drug unless your health care provider tells you to stop it early. Keep taking it even if you think you are better. Talk to your health care provider about the use of this drug in children. While it may be prescribed for selected conditions, precautions do apply. Overdosage: If you think you have taken too much of this medicine contact a poison control center or emergency room at once. NOTE: This medicine is only for you. Do not share this medicine with others. What if I miss a dose? If you miss a dose, take it as soon as you can. If it is almost time for your next dose, take only that dose. Do not take double or extra doses. What may interact with this medicine?  allopurinol  anticoagulants  birth control pills  methotrexate  probenecid This list may not describe all possible interactions. Give your health care provider a list of all the medicines, herbs, non-prescription drugs, or dietary supplements you use. Also tell them if you smoke, drink alcohol, or use illegal drugs. Some items may interact with  your medicine. What should I watch for while using this medicine? Tell your doctor or healthcare provider if your symptoms do not improve. This medicine may cause serious skin reactions. They can happen weeks to months after starting the medicine. Contact your healthcare provider right away if you notice fevers or flu-like symptoms with a rash. The rash may be red or purple and then turn into blisters or peeling of the skin. Or, you might notice a red rash with swelling of the face, lips or lymph nodes in your neck or under your arms. Do not treat diarrhea with over the counter products. Contact your doctor if you have diarrhea that lasts more than 2 days or if it is severe and watery. If you have diabetes, you may get a false-positive result for sugar in your urine. Check with your doctor or healthcare provider. Birth control pills may not work properly while you are taking this medicine. Talk to your doctor about using an  extra method of birth control. What side effects may I notice from receiving this medicine? Side effects that you should report to your doctor or health care professional as soon as possible:  allergic reactions like skin rash, itching or hives, swelling of the face, lips, or tongue  breathing problems  dark urine  fever or chills, sore throat  redness, blistering, peeling, or loosening of the skin, including inside the mouth  seizures  trouble passing urine or change in the amount of urine  unusual bleeding, bruising  unusually weak or tired  white patches or sores in the mouth or throat Side effects that usually do not require medical attention (report to your doctor or health care professional if they continue or are bothersome):  diarrhea  dizziness  headache  nausea, vomiting  stomach upset  vaginal or anal irritation This list may not describe all possible side effects. Call your doctor for medical advice about side effects. You may report side  effects to FDA at 1-800-FDA-1088. Where should I keep my medicine? Keep out of the reach of children and pets. Store at room temperature between 20 and 25 degrees C (68 and 77 degrees F). Throw away any unused drug after the expiration date. NOTE: This sheet is a summary. It may not cover all possible information. If you have questions about this medicine, talk to your doctor, pharmacist, or health care provider.  2020 Elsevier/Gold Standard (2018-12-14 11:55:53) Skin Tear A skin tear is a wound in which the top layers of skin peel off. This is a common problem for older people. It can also be a problem for people who take certain medicines for too long. To repair the skin, your doctor may use:  Tape.  Skin tape (adhesive) strips. A bandage (dressing) may also be placed over the tape or skin tape strips. Follow these instructions at home: Wound care   Clean the wound as told by your doctor. You may be told to keep the wound dry for the first few days. If you are told to clean the wound: ? Wash the wound with mild soap and water, a wound cleanser, or a salt-water (saline) solution. ? If you use soap, rinse the wound with water to remove all soap. ? Do not rub the wound dry. Pat the wound gently, or let it air dry. ? Keep the bandage dry as told by your doctor.  Change any bandage as told by your doctor. This includes changing the bandage if it gets wet, gets dirty, or starts to smell bad. To change your bandage: ? Wash your hands with soap and water before and after you change your bandage. If you cannot use soap and water, use hand sanitizer. ? Leave tape or skin tape strips in place. They may need to stay in place for 2 weeks or longer. If tape strips get loose and curl up, you may trim the loose edges. Do not remove tape strips completely unless your doctor says it is okay.  Check your wound every day for signs of infection. Check for: ? Redness, swelling, or pain. ? More fluid or  blood. ? Warmth. ? Pus or a bad smell.  Do not scratch or pick at the wound.  Protect the injured area until it has healed. Medicines  Take or apply over-the-counter and prescription medicines only as told by your doctor.  If you were prescribed an antibiotic medicine, take or apply it as told by your doctor. Do not stop using the  antibiotic even if your condition gets better. General instructions   Keep the bandage dry as told by your doctor.  Do not take baths, swim, use a hot tub, or do anything that puts your wound underwater until your doctor approves. Ask your doctor if you may take showers. You may only be allowed to take sponge baths.  Keep all follow-up visits as told by your doctor. This is important. Contact a doctor if:  You have redness, swelling, or pain around your wound.  You have more fluid or blood coming from your wound.  Your wound feels warm to the touch.  You have pus or a bad smell coming from your wound. Get help right away if:  You have a red streak that goes away from the skin tear.  You have a fever and chills, and your symptoms get worse all of a sudden. Summary  A skin tear is a wound in which the top layers of skin peel off.  To repair the skin, your doctor may use tape or skin tape strips.  Change any bandage as told by your doctor.  Take or apply over-the-counter and prescription medicines only as told by your doctor.  Contact a doctor if you have signs of infection. This information is not intended to replace advice given to you by your health care provider. Make sure you discuss any questions you have with your health care provider. Document Revised: 09/03/2018 Document Reviewed: 03/03/2018 Elsevier Patient Education  [place].

## 2019-11-23 ENCOUNTER — Ambulatory Visit: Payer: Self-pay | Admitting: Adult Health

## 2019-11-24 ENCOUNTER — Other Ambulatory Visit: Payer: Self-pay | Admitting: Adult Health

## 2019-11-24 LAB — URINE CULTURE

## 2019-11-24 MED ORDER — DOXYCYCLINE HYCLATE 100 MG PO TABS: 100.0000 mg | ORAL_TABLET | Freq: Two times a day (BID) | ORAL | 0 refills | Status: DC

## 2019-11-24 NOTE — Progress Notes (Signed)
Provide called patient- switched to Doxycycline. Discontinue  Augmentin. Sent script to pharmacy.

## 2019-11-24 NOTE — Progress Notes (Signed)
Meds ordered this encounter  Medications  . doxycycline (VIBRA-TABS) 100 MG tablet    Sig: Take 1 tablet (100 mg total) by mouth 2 (two) times daily.    Dispense:  10 tablet    Refill:  0   Augmentin discontinued per urine culture results. Spoke with patient given instructions on medication above, sun sensitivity , take with food and not lying down within one hour of taking not to be taken with vitamins within two hours.   He is feeling slightly better he reports however still having frequency. He is on Lasix. Will treat per culture.

## 2019-11-26 ENCOUNTER — Other Ambulatory Visit: Payer: Self-pay | Admitting: Family Medicine

## 2019-12-15 ENCOUNTER — Ambulatory Visit: Payer: Self-pay | Admitting: Family Medicine

## 2019-12-17 ENCOUNTER — Other Ambulatory Visit: Payer: Self-pay | Admitting: Family Medicine

## 2019-12-28 ENCOUNTER — Other Ambulatory Visit: Payer: Self-pay | Admitting: Family Medicine

## 2019-12-28 NOTE — Telephone Encounter (Signed)
Requested medication (s) are due for refill today: yes  Requested medication (s) are on the active medication list: yes  Last refill: 11/26/19  #60 0 refills  Future visit scheduled yes 02/07/20  Notes to clinic: not delegated  Requested Prescriptions  Pending Prescriptions Disp Refills   topiramate (TOPAMAX) 50 MG tablet [Pharmacy Med Name: Topiramate 50 MG Oral Tablet] 60 tablet 0    Sig: Take 1 tablet by mouth twice daily      Not Delegated - Neurology: Anticonvulsants - topiramate & zonisamide Failed - 12/28/2019  5:30 AM      Failed - This refill cannot be delegated      Failed - Cr in normal range and within 360 days    Creatinine  Date Value Ref Range Status  10/13/2013 0.87 0.60 - 1.30 mg/dL Final   Creatinine, Ser  Date Value Ref Range Status  11/11/2019 1.32 (H) 0.76 - 1.27 mg/dL Final          Passed - CO2 in normal range and within 360 days    CO2  Date Value Ref Range Status  11/11/2019 27 20 - 29 mmol/L Final   Co2  Date Value Ref Range Status  10/13/2013 30 21 - 32 mmol/L Final          Passed - Valid encounter within last 12 months    Recent Outpatient Visits           1 month ago Urinary frequency   [place] Flinchum, Kelby Aline, FNP   1 month ago Wound cellulitis- right lower leg anterior    [facility] Flinchum, Kelby Aline, FNP   2 months ago Wound cellulitis- right lower leg anterior    [place], FNP   2 months ago Stasis, venous   [facility] Jerrol Banana., MD   2 months ago Essential hypertension   [facility] Jerrol Banana., MD

## 2020-01-11 DIAGNOSIS — M7582 Other shoulder lesions, left shoulder: Secondary | ICD-10-CM | POA: Diagnosis not present

## 2020-01-11 DIAGNOSIS — M7542 Impingement syndrome of left shoulder: Secondary | ICD-10-CM | POA: Diagnosis not present

## 2020-01-11 DIAGNOSIS — M25512 Pain in left shoulder: Secondary | ICD-10-CM | POA: Diagnosis not present

## 2020-01-26 DIAGNOSIS — B353 Tinea pedis: Secondary | ICD-10-CM | POA: Diagnosis not present

## 2020-02-02 DIAGNOSIS — B353 Tinea pedis: Secondary | ICD-10-CM | POA: Diagnosis not present

## 2020-02-07 ENCOUNTER — Telehealth: Payer: Self-pay

## 2020-02-07 NOTE — Chronic Care Management (AMB) (Deleted)
   Chronic Care Management Pharmacy  Name: ASKIA HAZELIP  MRN: [id number] DOB: 21-Feb-1933  Chief Complaint/ HPI  Gerda Diss Sahota,  86 y.o. , male presents for their Follow-Up CCM visit with the clinical pharmacist via telephone due to COVID-19 Pandemic.  PCP : Jerrol Banana., MD  Their chronic conditions include: ***  Office Visits: 6/28 Urinary freq, Flinchum, BP 110/68 P 89 Wt 268 BMI 34.4, recurrent UTI, start Augmentin, wounds  Consult Visit: 8/17 Rotator cuff, Poggi, BP 128/82 Wt 266 BMI 34.1, pain 2/10, steroid inj  Medications: Outpatient Encounter Medications as of 02/07/2020  Medication Sig  . apixaban (ELIQUIS) 5 MG TABS tablet Take 1 tablet (5 mg total) by mouth 2 (two) times daily.  . diphenhydramine-acetaminophen (TYLENOL PM) 25-500 MG TABS Take 2 tablets by mouth at bedtime as needed.  . doxazosin (CARDURA) 8 MG tablet Take 1 tablet (8 mg total) by mouth daily.  [name] doxycycline (VIBRA-TABS) 100 MG tablet Take 1 tablet (100 mg total) by mouth 2 (two) times daily.  . furosemide (LASIX) 20 MG tablet Take 2 tablets by mouth daily  . Iodoquinol-HC-Aloe Polysacch (ALCORTIN A) 1-2-1 % GEL as needed.   [name] levothyroxine (SYNTHROID) 75 MCG tablet Take 1 tablet by mouth daily in the morning  . pantoprazole (PROTONIX) 40 MG tablet Take 1 tablet (40 mg total) by mouth daily.  . quinapril (ACCUPRIL) 20 MG tablet Take 1 tablet (20 mg total) by mouth daily.  . rosuvastatin (CRESTOR) 20 MG tablet Take 1 tablet by mouth daily  . topiramate (TOPAMAX) 50 MG tablet Take 1 tablet by mouth twice daily   No facility-administered encounter medications on file as of 02/07/2020.     Current Diagnosis/Assessment:  Goals Addressed   None    Diabetes   Recent Relevant Labs: Lab Results  Component Value Date/Time   HGBA1C 6.2 (A) 10/20/2019 01:43 PM   HGBA1C 6.3 (H) 06/25/2019 09:08 AM   HGBA1C 6.0 (H) 08/21/2016 08:16 AM     Checking BG: {CHL HP Blood Glucose Monitoring  Frequency:[phone]}  Recent FBG Readings: Recent pre-meal BG readings: *** Recent 2hr PP BG readings:  *** Recent HS BG readings: *** Patient has failed these meds in past: *** Patient is currently {CHL Controlled/[phone]} on the following medications: ***  Last diabetic Foot exam:  Lab Results  Component Value Date/Time   HMDIABEYEEXA No Retinopathy 04/27/2019 12:00 AM    Last diabetic Eye exam: No results found for: HMDIABFOOTEX   We discussed:  Ozempic started?  Plan  Continue {CHL HP [facility] Pharmacy Plans:[phone]}  Medication Management   Pt uses [facility] for all medications Uses pill box? Yes Pt endorses ***% compliance  We discussed:  Propranolol started?  Plan  Continue current medication management strategy  Follow up: *** month phone visit  ***

## 2020-02-09 ENCOUNTER — Other Ambulatory Visit: Payer: Self-pay

## 2020-02-09 ENCOUNTER — Ambulatory Visit: Payer: PPO | Admitting: Dermatology

## 2020-02-09 ENCOUNTER — Encounter: Payer: Self-pay | Admitting: Dermatology

## 2020-02-09 DIAGNOSIS — B353 Tinea pedis: Secondary | ICD-10-CM | POA: Diagnosis not present

## 2020-02-09 DIAGNOSIS — D692 Other nonthrombocytopenic purpura: Secondary | ICD-10-CM | POA: Diagnosis not present

## 2020-02-09 DIAGNOSIS — L821 Other seborrheic keratosis: Secondary | ICD-10-CM

## 2020-02-09 DIAGNOSIS — D1800 Hemangioma unspecified site: Secondary | ICD-10-CM | POA: Diagnosis not present

## 2020-02-09 DIAGNOSIS — L578 Other skin changes due to chronic exposure to nonionizing radiation: Secondary | ICD-10-CM

## 2020-02-09 DIAGNOSIS — D229 Melanocytic nevi, unspecified: Secondary | ICD-10-CM

## 2020-02-09 DIAGNOSIS — Z85828 Personal history of other malignant neoplasm of skin: Secondary | ICD-10-CM | POA: Diagnosis not present

## 2020-02-09 DIAGNOSIS — L820 Inflamed seborrheic keratosis: Secondary | ICD-10-CM

## 2020-02-09 DIAGNOSIS — L304 Erythema intertrigo: Secondary | ICD-10-CM | POA: Diagnosis not present

## 2020-02-09 DIAGNOSIS — L719 Rosacea, unspecified: Secondary | ICD-10-CM | POA: Diagnosis not present

## 2020-02-09 DIAGNOSIS — Z1283 Encounter for screening for malignant neoplasm of skin: Secondary | ICD-10-CM

## 2020-02-09 DIAGNOSIS — I872 Venous insufficiency (chronic) (peripheral): Secondary | ICD-10-CM | POA: Diagnosis not present

## 2020-02-09 DIAGNOSIS — L814 Other melanin hyperpigmentation: Secondary | ICD-10-CM

## 2020-02-09 DIAGNOSIS — L570 Actinic keratosis: Secondary | ICD-10-CM | POA: Diagnosis not present

## 2020-02-09 MED ORDER — FLUCONAZOLE 200 MG PO TABS: 200.0000 mg | ORAL_TABLET | ORAL | 1 refills | Status: DC

## 2020-02-09 NOTE — Patient Instructions (Signed)

## 2020-02-09 NOTE — Progress Notes (Signed)
Follow-Up Visit   Subjective  Jared Rice is a 86 y.o. male who presents for the following: Annual Exam (History of SCC/BCC - TBSE today). The patient presents for Total-Body Skin Exam (TBSE) for skin cancer screening and mole check.  The following portions of the chart were reviewed this encounter and updated as appropriate:  Tobacco  Allergies  Meds  Problems  Med Hx  Surg Hx  Fam Hx     Review of Systems:  No other skin or systemic complaints except as noted in HPI or Assessment and Plan.  Objective  Well appearing patient in no apparent distress; mood and affect are within normal limits.  A full examination was performed including scalp, head, eyes, ears, nose, lips, neck, chest, axillae, abdomen, back, buttocks, bilateral upper extremities, bilateral lower extremities, hands, feet, fingers, toes, fingernails, and toenails. All findings within normal limits unless otherwise noted below.  Objective  Trunk/neck (4): Erythematous keratotic or waxy stuck-on papule or plaque.   Objective  Scalp/face (7): Erythematous thin papules/macules with gritty scale.   Objective  Face: Dilated blood vessels  Objective  Bilateral feet: Scaliness   Objective  Groin: Bright red erythema   Objective  Bilateral lower legs: Severe stasis changes   Assessment & Plan    Lentigines - Scattered tan macules - Discussed due to sun exposure - Benign, observe - Call for any changes  Seborrheic Keratoses - Stuck-on, waxy, tan-brown papules and plaques  - Discussed benign etiology and prognosis. - Observe - Call for any changes  Melanocytic Nevi - Tan-brown and/or pink-flesh-colored symmetric macules and papules - Benign appearing on exam today - Observation - Call clinic for new or changing moles - Recommend daily use of broad spectrum spf 30+ sunscreen to sun-exposed areas.   Hemangiomas - Red papules - Discussed benign nature - Observe - Call for any  changes  Actinic Damage - diffuse scaly erythematous macules with underlying dyspigmentation - Recommend daily broad spectrum sunscreen SPF 30+ to sun-exposed areas, reapply every 2 hours as needed.  - Call for new or changing lesions.  Skin cancer screening performed today.  Purpura - Violaceous macules and patches - Benign - Related to age, sun damage and/or use of blood thinners - Observe - Can use OTC arnica containing moisturizer such as Dermend Bruise Formula if desired - Call for worsening or other concerns   Inflamed seborrheic keratosis (4) Trunk/neck  Destruction of lesion - Trunk/neck Complexity: simple   Destruction method: cryotherapy   Informed consent: discussed and consent obtained   Timeout:  patient name, date of birth, surgical site, and procedure verified Lesion destroyed using liquid nitrogen: Yes   Region frozen until ice ball extended beyond lesion: Yes   Outcome: patient tolerated procedure well with no complications   Post-procedure details: wound care instructions given    AK (actinic keratosis) (7) Scalp/face  Destruction of lesion - Scalp/face Complexity: simple   Destruction method: cryotherapy   Informed consent: discussed and consent obtained   Timeout:  patient name, date of birth, surgical site, and procedure verified Lesion destroyed using liquid nitrogen: Yes   Region frozen until ice ball extended beyond lesion: Yes   Outcome: patient tolerated procedure well with no complications   Post-procedure details: wound care instructions given    Rosacea Face No treatment at this time Consider topical in future  Tinea pedis of both feet - severe requiring systemic Rx Bilateral feet Diflucan 200mg  2 times per week  fluconazole (DIFLUCAN) 200 MG  tablet - Bilateral feet  Erythema intertrigo - severe requiring systemic treatment Groin Diflucan 200mg  2 times per week Continue Iodoquinol - Skin Medicinals mix Rx Ordered  Medications: fluconazole (DIFLUCAN) 200 MG tablet  Venous stasis dermatitis of right lower extremity Bilateral lower legs With edema. 2ndary to A-fib and stasis. Graduated compression stockings + leg elevation Discuss with cardiologist.  History of Basal Cell Carcinoma of the Skin - No evidence of recurrence today - Recommend regular full body skin exams - Recommend daily broad spectrum sunscreen SPF 30+ to sun-exposed areas, reapply every 2 hours as needed.  - Call if any new or changing lesions are noted between office visits  History of Squamous Cell Carcinoma of the Skin - No evidence of recurrence today - No lymphadenopathy - Recommend regular full body skin exams - Recommend daily broad spectrum sunscreen SPF 30+ to sun-exposed areas, reapply every 2 hours as needed.  - Call if any new or changing lesions are noted between office visits  Return in about 6 months (around 08/08/2020).  I, Ashok Cordia, CMA, am acting as scribe for Sarina Ser, MD .  Documentation: I have reviewed the above documentation for accuracy and completeness, and I agree with the above.  Sarina Ser, MD

## 2020-02-11 DIAGNOSIS — L03115 Cellulitis of right lower limb: Secondary | ICD-10-CM | POA: Diagnosis not present

## 2020-02-14 ENCOUNTER — Ambulatory Visit (INDEPENDENT_AMBULATORY_CARE_PROVIDER_SITE_OTHER): Payer: PPO | Admitting: Physician Assistant

## 2020-02-14 ENCOUNTER — Ambulatory Visit
Admission: RE | Admit: 2020-02-14 | Discharge: 2020-02-14 | Disposition: A | Discharge status: Home / Self Care | Payer: PPO | Attending: Physician Assistant | Admitting: Physician Assistant

## 2020-02-14 ENCOUNTER — Encounter: Payer: Self-pay | Admitting: Physician Assistant

## 2020-02-14 ENCOUNTER — Ambulatory Visit
Admission: RE | Admit: 2020-02-14 | Discharge: 2020-02-14 | Disposition: A | Discharge status: Home / Self Care | Payer: PPO | Source: Ambulatory Visit | Attending: Physician Assistant | Admitting: Physician Assistant

## 2020-02-14 ENCOUNTER — Other Ambulatory Visit: Payer: Self-pay

## 2020-02-14 VITALS — BP 90/62 | HR 88 | Temp 98.5°F | Ht 74.0 in | Wt 259.0 lb

## 2020-02-14 DIAGNOSIS — L0390 Cellulitis, unspecified: Secondary | ICD-10-CM

## 2020-02-14 DIAGNOSIS — M1711 Unilateral primary osteoarthritis, right knee: Secondary | ICD-10-CM | POA: Diagnosis not present

## 2020-02-14 DIAGNOSIS — E119 Type 2 diabetes mellitus without complications: Secondary | ICD-10-CM | POA: Diagnosis not present

## 2020-02-14 DIAGNOSIS — Z23 Encounter for immunization: Secondary | ICD-10-CM | POA: Diagnosis not present

## 2020-02-14 DIAGNOSIS — S81801A Unspecified open wound, right lower leg, initial encounter: Secondary | ICD-10-CM | POA: Diagnosis not present

## 2020-02-14 MED ORDER — DOXYCYCLINE HYCLATE 100 MG PO TABS: 100.0000 mg | ORAL_TABLET | Freq: Two times a day (BID) | ORAL | 0 refills | Status: AC

## 2020-02-14 NOTE — Progress Notes (Signed)
Established patient visit   Patient: Jared Rice   DOB: Jan 04, 1933   86 y.o. Male  MRN: [id number] Visit Date: 02/14/2020  Today's healthcare provider: Trinna Post, PA-C   Chief Complaint  Patient presents with  . Leg Swelling   Subjective    HPI  Patient with a history of CAD, venous stasis disease and prediabetes presents today with a wound on his lower right leg. He reports that it started to "ooze" blood today. He reports that it has done that before, but has leaked water not blood.  He reports he has had this wound for about a week. On Friday, 02/11/2020 he had his home health service come out. They consulted with a provider and he was started on bactrim DS BID x 7 days. He reports he has taken three days of this and has not noticed much improvement. Reports the area is painful to the tough. His lower extremities are both swollen, right > left which patient reports is his baseline.      Medications: Outpatient Medications Prior to Visit  Medication Sig  . apixaban (ELIQUIS) 5 MG TABS tablet Take 1 tablet (5 mg total) by mouth 2 (two) times daily.  . diphenhydramine-acetaminophen (TYLENOL PM) 25-500 MG TABS Take 2 tablets by mouth at bedtime as needed.  . doxazosin (CARDURA) 8 MG tablet Take 1 tablet (8 mg total) by mouth daily.  . furosemide (LASIX) 20 MG tablet Take 2 tablets by mouth daily  . Iodoquinol-HC-Aloe Polysacch (ALCORTIN A) 1-2-1 % GEL as needed.   [name] levothyroxine (SYNTHROID) 75 MCG tablet Take 1 tablet by mouth daily in the morning  . pantoprazole (PROTONIX) 40 MG tablet Take 1 tablet (40 mg total) by mouth daily.  . quinapril (ACCUPRIL) 20 MG tablet Take 1 tablet (20 mg total) by mouth daily.  . rosuvastatin (CRESTOR) 20 MG tablet Take 1 tablet by mouth daily  . topiramate (TOPAMAX) 50 MG tablet Take 1 tablet by mouth twice daily  . fluconazole (DIFLUCAN) 200 MG tablet Take 1 tablet (200 mg total) by mouth 2 (two) times a week. Monday and Thursday  (Patient not taking: Reported on 02/14/2020)  . [DISCONTINUED] doxycycline (VIBRA-TABS) 100 MG tablet Take 1 tablet (100 mg total) by mouth 2 (two) times daily. (Patient not taking: Reported on 02/14/2020)   No facility-administered medications prior to visit.    Review of Systems  Constitutional: Negative.   Cardiovascular: Positive for leg swelling.  Musculoskeletal: Positive for arthralgias.  Skin: Positive for color change and wound.      Objective    BP 90/62   Pulse 88   Temp 98.5 F (36.9 C)   Ht 6\' 2"  (1.88 m)   Wt 259 lb (117.5 kg)   BMI 33.25 kg/m    Physical Exam Constitutional:      Appearance: Normal appearance.  Musculoskeletal:     Right lower leg: Edema present.     Left lower leg: Edema present.  Skin:    Capillary Refill: Capillary refill takes 2 to 3 seconds.     Findings: Erythema present.     Comments: There is a small 1 cm linear wound on his anterior right shin that is draining serosanguinous fluid. It is slightly erythematous around the wound and TTP. The right leg generally has evidence of venous stasis rash and is slightly more edematous than left.   Neurological:     Mental Status: He is alert and oriented to person, place, and time.  Mental status is at baseline.     Sensory: Sensation is intact.  Psychiatric:        Mood and Affect: Mood normal.        Behavior: Behavior normal.     Media Information   Document Information  Photos  Right leg wound  02/14/2020 14:56  Attached To:  Office Visit on 02/14/20 with Trinna Post, PA-C  Source Information  Carles Collet M, PA-C  [facility]     No results found for any visits on 02/14/20.  Assessment & Plan    1. Wound cellulitis- right lower leg anterior   Not improving, will get xray as below out of abundance of caution to check for osteo. Tdap and flu updated today. Change from bactrim to doxycycline. Follow up one week with PCP.   - doxycycline (VIBRA-TABS) 100  MG tablet; Take 1 tablet (100 mg total) by mouth 2 (two) times daily for 7 days.  Dispense: 14 tablet; Refill: 0 - DG Tibia/Fibula Right; Future  2. Type 2 diabetes mellitus without complication, without long-term current use of insulin (HCC)  - doxycycline (VIBRA-TABS) 100 MG tablet; Take 1 tablet (100 mg total) by mouth 2 (two) times daily for 7 days.  Dispense: 14 tablet; Refill: 0  3. Need for influenza vaccination  - Flu Vaccine QUAD High Dose(Fluad)  4. Need for diphtheria-tetanus-pertussis (Tdap) vaccine  - Tdap vaccine greater than or equal to 7yo IM  I, Trinna Post, PA-C, have reviewed all documentation for this visit. The documentation on 02/14/20 for the exam, diagnosis, procedures, and orders are all accurate and complete.  The entirety of the information documented in the History of Present Illness, Review of Systems and Physical Exam were personally obtained by me. Portions of this information were initially documented by [facility] and reviewed by me for thoroughness and accuracy.    No follow-ups on file.         Paulene Floor  [facility] [phone] (phone) [phone] (fax)  [place]

## 2020-02-14 NOTE — Patient Instructions (Signed)

## 2020-02-15 ENCOUNTER — Telehealth: Payer: Self-pay | Admitting: Physician Assistant

## 2020-02-15 NOTE — Telephone Encounter (Signed)
Can we call patient with his xray results? I left a mychart message but preferred a phone call as he sometimes has trouble getting on. Thank you.

## 2020-02-15 NOTE — Telephone Encounter (Signed)
LMTCB-If patient calls back ok for [facility] nurse to give results. Thanks.  DG Tibia/Fibula Right: Result Notes  Paulene Floor  02/15/2020 8:22 AM EDT     Hi Clair Gulling,  Your xray shows some soft tissue swelling associated with your wound but otherwise your bones appear normal. This is reassuring. We'll see you back for your follow up next week.  Best, Carles Collet, PA-C

## 2020-02-15 NOTE — Telephone Encounter (Signed)
Patient given results as noted by Carles Collet, PA-C on 02/15/20, he verbalized understanding.

## 2020-02-17 DIAGNOSIS — M1711 Unilateral primary osteoarthritis, right knee: Secondary | ICD-10-CM | POA: Diagnosis not present

## 2020-02-18 NOTE — Progress Notes (Signed)
Established patient visit   Patient: Jared Rice   DOB: [birth year]   86 y.o. Male  MRN: [id number] Visit Date: 02/21/2020  Today's healthcare provider: Wilhemena Durie, MD   Chief Complaint  Patient presents with  . Follow-up   Subjective    HPI  Patient's leg is a lot better.  As a matter of fact, it is almost completely healed.  There is no tenderness and no warmth.  The wound from before appears to be a very superficial abrasion/laceration.  It is small, not draining and no surrounding erythema edema induration or tenderness. Wound cellulitis- right lower leg anterior  From 02/14/2020-Not improving, will get xray as below out of abundance of caution to check for osteo. Tdap and flu updated today. Change from bactrim to doxycycline. Follow up one week with PCP.       Medications: Outpatient Medications Prior to Visit  Medication Sig  . apixaban (ELIQUIS) 5 MG TABS tablet Take 1 tablet (5 mg total) by mouth 2 (two) times daily.  . diphenhydramine-acetaminophen (TYLENOL PM) 25-500 MG TABS Take 2 tablets by mouth at bedtime as needed.  . doxazosin (CARDURA) 8 MG tablet Take 1 tablet (8 mg total) by mouth daily.  [name] doxycycline (VIBRA-TABS) 100 MG tablet Take 1 tablet (100 mg total) by mouth 2 (two) times daily for 7 days.  . furosemide (LASIX) 20 MG tablet Take 2 tablets by mouth daily  . Iodoquinol-HC-Aloe Polysacch (ALCORTIN A) 1-2-1 % GEL as needed.   [name] levothyroxine (SYNTHROID) 75 MCG tablet Take 1 tablet by mouth daily in the morning  . pantoprazole (PROTONIX) 40 MG tablet Take 1 tablet (40 mg total) by mouth daily.  . quinapril (ACCUPRIL) 20 MG tablet Take 1 tablet (20 mg total) by mouth daily.  . rosuvastatin (CRESTOR) 20 MG tablet Take 1 tablet by mouth daily  . topiramate (TOPAMAX) 50 MG tablet Take 1 tablet by mouth twice daily  . fluconazole (DIFLUCAN) 200 MG tablet Take 1 tablet (200 mg total) by mouth 2 (two) times a week. Monday and Thursday (Patient not  taking: Reported on 02/21/2020)   No facility-administered medications prior to visit.    Review of Systems  Constitutional: Negative for appetite change, chills and fever.  Respiratory: Negative for chest tightness, shortness of breath and wheezing.   Cardiovascular: Negative for chest pain and palpitations.  Gastrointestinal: Negative for abdominal pain, nausea and vomiting.       Objective    BP 104/80   Pulse 88   Temp 98.3 F (36.8 C) (Oral)   Resp 16   Wt 258 lb 12.8 oz (117.4 kg)   SpO2 96%   BMI 33.23 kg/m  BP Readings from Last 3 Encounters:  02/21/20 104/80  02/14/20 90/62  11/22/19 110/68   Wt Readings from Last 3 Encounters:  02/21/20 258 lb 12.8 oz (117.4 kg)  02/14/20 259 lb (117.5 kg)  11/22/19 268 lb (121.6 kg)      Physical Exam Vitals reviewed.  Constitutional:      Appearance: Normal appearance.  HENT:     Head: Normocephalic and atraumatic.     Right Ear: External ear normal.     Left Ear: External ear normal.  Eyes:     General: No scleral icterus.    Conjunctiva/sclera: Conjunctivae normal.  Cardiovascular:     Rate and Rhythm: Normal rate and regular rhythm.     Pulses: Normal pulses.     Heart sounds: Normal heart sounds.  Pulmonary:     Effort: Pulmonary effort is normal.     Breath sounds: Normal breath sounds.  Musculoskeletal:     Right lower leg: Edema present.     Left lower leg: Edema present.     Comments: 1+ lower extremity edema  Skin:    General: Skin is warm and dry.     Comments: Significant venous stasis changes of the lower legs with no obvious cellulitis present  Neurological:     General: No focal deficit present.     Mental Status: He is alert and oriented to person, place, and time.  Psychiatric:        Mood and Affect: Mood normal.        Behavior: Behavior normal.        Thought Content: Thought content normal.        Judgment: Judgment normal.       No results found for any visits on 02/21/20.   Assessment & Plan     1. Wound cellulitis- right lower leg anterior  Clinically this is almost resolved.  2. Type 2 diabetes mellitus without complication, without long-term current use of insulin (HCC) Follow-up A1c in January  3. Need for influenza vaccination   4. Need for diphtheria-tetanus-pertussis (Tdap) vaccine Given last week  5. Edema of both legs Clinically much better  6. Class 2 severe obesity due to excess calories with serious comorbidity and body mass index (BMI) of 36.0 to 36.9 in adult [facility]) With diabetes/ASCVD and hypertension. Consider cutting Accupril dose in half on next visit if blood pressure still remains low   Return in about 4 months (around 06/22/2020).      I, Wilhemena Durie, MD, have reviewed all documentation for this visit. The documentation on 02/22/20 for the exam, diagnosis, procedures, and orders are all accurate and complete.    Richard Cranford Mon, MD  [facility] [phone] (phone) [phone] (fax)  [place]

## 2020-02-21 ENCOUNTER — Encounter: Payer: Self-pay | Admitting: Family Medicine

## 2020-02-21 ENCOUNTER — Ambulatory Visit (INDEPENDENT_AMBULATORY_CARE_PROVIDER_SITE_OTHER): Payer: PPO | Admitting: Family Medicine

## 2020-02-21 ENCOUNTER — Other Ambulatory Visit: Payer: Self-pay

## 2020-02-21 VITALS — BP 104/80 | HR 88 | Temp 98.3°F | Resp 16 | Wt 258.8 lb

## 2020-02-21 DIAGNOSIS — R600 Localized edema: Secondary | ICD-10-CM | POA: Diagnosis not present

## 2020-02-21 DIAGNOSIS — E119 Type 2 diabetes mellitus without complications: Secondary | ICD-10-CM

## 2020-02-21 DIAGNOSIS — Z23 Encounter for immunization: Secondary | ICD-10-CM

## 2020-02-21 DIAGNOSIS — Z6836 Body mass index (BMI) 36.0-36.9, adult: Secondary | ICD-10-CM

## 2020-02-21 DIAGNOSIS — L0390 Cellulitis, unspecified: Secondary | ICD-10-CM | POA: Diagnosis not present

## 2020-02-28 ENCOUNTER — Ambulatory Visit: Payer: PPO | Admitting: Pharmacist

## 2020-02-28 ENCOUNTER — Other Ambulatory Visit: Payer: Self-pay

## 2020-02-28 DIAGNOSIS — E785 Hyperlipidemia, unspecified: Secondary | ICD-10-CM

## 2020-02-28 DIAGNOSIS — I10 Essential (primary) hypertension: Secondary | ICD-10-CM

## 2020-02-28 NOTE — Chronic Care Management (AMB) (Signed)
Chronic Care Management Pharmacy  Name: Jared Rice  MRN: [id number] DOB: [birth year]  Chief Complaint/ HPI  Jared Rice,  86 y.o. , male presents for their Follow-Up CCM visit with the clinical pharmacist via telephone due to COVID-19 Pandemic.  PCP : Jerrol Banana., MD  Their chronic conditions include: HTN, HLD  Office Visits:NA  Consult Visit:NA  Medications: Outpatient Encounter Medications as of 02/28/2020  Medication Sig  . apixaban (ELIQUIS) 5 MG TABS tablet Take 1 tablet (5 mg total) by mouth 2 (two) times daily.  . diphenhydramine-acetaminophen (TYLENOL PM) 25-500 MG TABS Take 2 tablets by mouth at bedtime as needed.  . doxazosin (CARDURA) 8 MG tablet Take 1 tablet (8 mg total) by mouth daily.  . fluconazole (DIFLUCAN) 200 MG tablet Take 1 tablet (200 mg total) by mouth 2 (two) times a week. Monday and Thursday (Patient not taking: Reported on 02/21/2020)  . furosemide (LASIX) 20 MG tablet Take 2 tablets by mouth daily  . Iodoquinol-HC-Aloe Polysacch (ALCORTIN A) 1-2-1 % GEL as needed.   [name] levothyroxine (SYNTHROID) 75 MCG tablet Take 1 tablet by mouth daily in the morning  . pantoprazole (PROTONIX) 40 MG tablet Take 1 tablet (40 mg total) by mouth daily.  . quinapril (ACCUPRIL) 20 MG tablet Take 1 tablet (20 mg total) by mouth daily.  . rosuvastatin (CRESTOR) 20 MG tablet Take 1 tablet by mouth daily  . topiramate (TOPAMAX) 50 MG tablet Take 1 tablet by mouth twice daily   No facility-administered encounter medications on file as of 02/28/2020.     Financial Resource Strain: Low Risk   . Difficulty of Paying Living Expenses: Not hard at all     Current Diagnosis/Assessment:  Goals Addressed            This Visit's Progress   . Hyperlipidemia - goal LDL < 70       CARE PLAN ENTRY (see longitudinal plan of care for additional care plan information)  Current Barriers:  . Controlled hyperlipidemia, complicated by hypertension,  hyperglycemia . Current antihyperlipidemic regimen: Crestor 63m daily . Previous antihyperlipidemic medications tried NA . Most recent lipid panel:     Component Value Date/Time   CHOL 104 06/25/2019 0908   TRIG 45 06/25/2019 0908   HDL 42 06/25/2019 0908   CHOLHDL 2.5 06/25/2019 0908   LDLCALC 51 06/25/2019 0908 .   .Marland KitchenASCVD risk enhancing conditions: age >>44 DM, HTN . 10-year ASCVD risk score: NA  Pharmacist Clinical Goal(s):  .Marland KitchenOver the next 90 days, patient will work with PharmD and providers towards continuing optimized antihyperlipidemic therapy  Interventions: . Comprehensive medication review performed; medication list updated in electronic medical record.  .[facility] team collaboration (see longitudinal plan of care)  Patient Self Care Activities:  . Patient will focus on medication adherence by ensuring medication list reflects how he is actually taking the rosuvastatin  Initial goal documentation     . Hypertension - goal BP < 140/90       CARE PLAN ENTRY (see longitudinal plan of care for additional care plan information)  Current Barriers:  . Controlled hypertension, complicated by hyperlipidemia, obesity . Current antihypertensive regimen: quinapril 289mdaily, doxazosin 79m59maily . Previous antihypertensives tried: NA . Last practice recorded BP readings:  BP Readings from Last 3 Encounters:  08/04/19 133/74  06/22/19 (!) 142/81  05/13/19 122/74 .   . CMarland Kitchenrrent home BP readings: doesn't check . Most recent eGFR/CrCl: No results found for:  EGFR  No components found for: CRCL  Pharmacist Clinical Goal(s):  [name] Over the next 90 days, patient will work with PharmD and providers to continue optimized antihypertensive regimen  Interventions: . Inter-disciplinary care team collaboration (see longitudinal plan of care) . Comprehensive medication review performed; medication list updated in the electronic medical record.   Patient Self Care  Activities:  . Patient will check BP and pulse, document, and provide at future appointments . Patient will focus on medication adherence by checking BP at home for the next 30 days  Initial goal documentation       Hypertension   BP goal is:  <140/90  Office blood pressures are  BP Readings from Last 3 Encounters:  02/21/20 104/80  02/14/20 90/62  11/22/19 110/68   Patient checks BP at home infrequently Patient home BP readings are ranging: NA  Patient has failed these meds in the past: NA Patient is currently controlled on the following medications:  . Quinapril 54m daily . Doxazosin 814mdaily  We discussed  At goal Denies hypotension JoMartiniqueor cardiology, maybe optional furosemide Bruises easily  Plan  Continue current medications   Hyperlipidemia   LDL goal < 70  Lipid Panel     Component Value Date/Time   CHOL 104 06/25/2019 0908   TRIG 45 06/25/2019 0908   HDL 42 06/25/2019 0908   LDLCALC 51 06/25/2019 0908    Hepatic Function Latest Ref Rng & Units 11/11/2019 06/25/2019 02/24/2019  Total Protein 6.0 - 8.5 g/dL 6.0 5.6(L) 5.8(L)  Albumin 3.6 - 4.6 g/dL 3.9 3.8 3.7  AST 0 - 40 IU/L 8 9 14(L)  ALT 0 - 44 IU/L 5 6 12   Alk Phosphatase 48 - 121 IU/L 66 65 47  Total Bilirubin 0.0 - 1.2 mg/dL 0.4 0.3 0.8     The ASCVD Risk score (GMikey BussingC Jr., et al., 2013) failed to calculate for the following reasons:   The 2013 ASCVD risk score is only valid for ages 4059o [id number] Patient has failed these meds in past: NA Patient is currently controlled on the following medications:  . Crestor 2065maily  We discussed:   At goal Denies myalgias Tolerating well Lost weight (30#) Not eating as much  Plan  Continue current medications  Medication Management   Pt uses [place] all medications Uses pill box? Yes Pt endorses 100% compliance  We discussed:  Shot in knee on 9/23, cortisone Left knee replaced  Plan  Continue current medication  management strategy  Follow up: 3 month phone visit  TedMilus HeightharmD, BCGParadise HeightsTTArco Medical [phone]

## 2020-02-29 NOTE — Patient Instructions (Addendum)
Visit Information  Goals Addressed            This Visit's Progress   . Hyperlipidemia - goal LDL < 70       CARE PLAN ENTRY (see longitudinal plan of care for additional care plan information)  Current Barriers:  . Controlled hyperlipidemia, complicated by hypertension, hyperglycemia . Current antihyperlipidemic regimen: Crestor 84m daily . Previous antihyperlipidemic medications tried NA . Most recent lipid panel:     Component Value Date/Time   CHOL 104 06/25/2019 0908   TRIG 45 06/25/2019 0908   HDL 42 06/25/2019 0908   CHOLHDL 2.5 06/25/2019 0908   LDLCALC 51 06/25/2019 0908 .   .Marland KitchenASCVD risk enhancing conditions: age >>66 DM, HTN . 10-year ASCVD risk score: NA  Pharmacist Clinical Goal(s):  .Marland KitchenOver the next 90 days, patient will work with PharmD and providers towards continuing optimized antihyperlipidemic therapy  Interventions: . Comprehensive medication review performed; medication list updated in electronic medical record.  .[facility] team collaboration (see longitudinal plan of care)  Patient Self Care Activities:  . Patient will focus on medication adherence by ensuring medication list reflects how he is actually taking the rosuvastatin  Initial goal documentation     . Hypertension - goal BP < 140/90       CARE PLAN ENTRY (see longitudinal plan of care for additional care plan information)  Current Barriers:  . Controlled hypertension, complicated by hyperlipidemia, obesity . Current antihypertensive regimen: quinapril 273mdaily, doxazosin 28m49maily . Previous antihypertensives tried: NA . Last practice recorded BP readings:  BP Readings from Last 3 Encounters:  08/04/19 133/74  06/22/19 (!) 142/81  05/13/19 122/74 .   . CMarland Kitchenrrent home BP readings: doesn't check . Most recent eGFR/CrCl: No results found for: EGFR  No components found for: CRCL  Pharmacist Clinical Goal(s):  . OMarland Kitchener the next 90 days, patient will work with PharmD and  providers to continue optimized antihypertensive regimen  Interventions: . Inter-disciplinary care team collaboration (see longitudinal plan of care) . Comprehensive medication review performed; medication list updated in the electronic medical record.   Patient Self Care Activities:  . Patient will check BP and pulse, document, and provide at future appointments . Patient will focus on medication adherence by checking BP at home for the next 30 days  Initial goal documentation        Print copy of patient instructions provided.   Telephone follow up appointment with pharmacy team member scheduled for: 3 months  TedMilus HeightharmD, BCGOakdaleTTLa Harpe Medical [phone] Dyslipidemia is an imbalance of waxy, fat-like substances (lipids) in the blood. The body needs lipids in small amounts. Dyslipidemia often involves a high level of cholesterol or triglycerides, which are types of lipids. Common forms of dyslipidemia include:  High levels of LDL cholesterol. LDL is the type of cholesterol that causes fatty deposits (plaques) to build up in the blood vessels that carry blood away from your heart (arteries).  Low levels of HDL cholesterol. HDL cholesterol is the type of cholesterol that protects against heart disease. High levels of HDL remove the LDL buildup from arteries.  High levels of triglycerides. Triglycerides are a fatty substance in the blood that is linked to a buildup of plaques in the arteries. What are the causes? Primary dyslipidemia is caused by changes (mutations) in genes that are passed down through families (inherited). These mutations cause several types of dyslipidemia. Secondary dyslipidemia is caused by lifestyle choices  and diseases that lead to dyslipidemia, such as:  Eating a diet that is high in animal fat.  Not getting enough exercise.  Having diabetes, kidney disease, liver disease, or thyroid  disease.  Drinking large amounts of alcohol.  Using certain medicines. What increases the risk? You are more likely to develop this condition if you are an older man or if you are a woman who has gone through menopause. Other risk factors include:  Having a family history of dyslipidemia.  Taking certain medicines, including birth control pills, steroids, some diuretics, and beta-blockers.  Smoking cigarettes.  Eating a high-fat diet.  Having certain medical conditions such as diabetes, polycystic ovary syndrome (PCOS), kidney disease, liver disease, or hypothyroidism.  Not exercising regularly.  Being overweight or obese with too much belly fat. What are the signs or symptoms? In most cases, dyslipidemia does not usually cause any symptoms. In severe cases, very high lipid levels can cause:  Fatty bumps under the skin (xanthomas).  White or gray ring around the black center (pupil) of the eye. Very high triglyceride levels can cause inflammation of the pancreas (pancreatitis). How is this diagnosed? Your health care provider may diagnose dyslipidemia based on a routine blood test (fasting blood test). Because most people do not have symptoms of the condition, this blood testing (lipid profile) is done on adults age 84 and older and is repeated every 5 years. This test checks:  Total cholesterol. This measures the total amount of cholesterol in your blood, including LDL cholesterol, HDL cholesterol, and triglycerides. A healthy number is below 200.  LDL cholesterol. The target number for LDL cholesterol is different for each person, depending on individual risk factors. Ask your health care provider what your LDL cholesterol should be.  HDL cholesterol. An HDL level of 60 or higher is best because it helps to protect against heart disease. A number below 10 for men or below 44 for women increases the risk for heart disease.  Triglycerides. A healthy triglyceride number is below  150. If your lipid profile is abnormal, your health care provider may do other blood tests. How is this treated? Treatment depends on the type of dyslipidemia that you have and your other risk factors for heart disease and stroke. Your health care provider will have a target range for your lipid levels based on this information. For many people, this condition may be treated by lifestyle changes, such as diet and exercise. Your health care provider may recommend that you:  Get regular exercise.  Make changes to your diet.  Quit smoking if you smoke. If diet changes and exercise do not help you reach your goals, your health care provider may also prescribe medicine to lower lipids. The most commonly prescribed type of medicine lowers your LDL cholesterol (statin drug). If you have a high triglyceride level, your provider may prescribe another type of drug (fibrate) or an omega-3 fish oil supplement, or both. Follow these instructions at home:  Eating and drinking  Follow instructions from your health care provider or dietitian about eating or drinking restrictions.  Eat a healthy diet as told by your health care provider. This can help you reach and maintain a healthy weight, lower your LDL cholesterol, and raise your HDL cholesterol. This may include: ? Limiting your calories, if you are overweight. ? Eating more fruits, vegetables, whole grains, fish, and lean meats. ? Limiting saturated fat, trans fat, and cholesterol.  If you drink alcohol: ? Limit how much you use. ?  Be aware of how much alcohol is in your drink. In the [place] one drink equals one 12 oz bottle of beer (355 mL), one 5 oz glass of wine (148 mL), or one 1 oz glass of hard liquor (44 mL).  Do not drink alcohol if: ? Your health care provider tells you not to drink. ? You are pregnant, may be pregnant, or are planning to become pregnant. Activity  Get regular exercise. Start an exercise and strength training program as  told by your health care provider. Ask your health care provider what activities are safe for you. Your health care provider may recommend: ? 30 minutes of aerobic activity 4-6 days a week. Brisk walking is an example of aerobic activity. ? Strength training 2 days a week. General instructions  Do not use any products that contain nicotine or tobacco, such as cigarettes, e-cigarettes, and chewing tobacco. If you need help quitting, ask your health care provider.  Take over-the-counter and prescription medicines only as told by your health care provider. This includes supplements.  Keep all follow-up visits as told by your health care provider. Contact a health care provider if:  You are: ? Having trouble sticking to your exercise or diet plan. ? Struggling to quit smoking or control your use of alcohol. Summary  Dyslipidemia often involves a high level of cholesterol or triglycerides, which are types of lipids.  Treatment depends on the type of dyslipidemia that you have and your other risk factors for heart disease and stroke.  For many people, treatment starts with lifestyle changes, such as diet and exercise.  Your health care provider may prescribe medicine to lower lipids. This information is not intended to replace advice given to you by your health care provider. Make sure you discuss any questions you have with your health care provider. Document Revised: 01/05/2018 Document Reviewed: 12/12/2017 Elsevier Patient Education  [place].

## 2020-03-07 ENCOUNTER — Telehealth: Payer: Self-pay | Admitting: Cardiology

## 2020-03-07 NOTE — Telephone Encounter (Signed)
Returned call to patient who states he is having knee pain and wants to know what to take since he is taking Eliquis. I advised him to try Tylenol 650 mg 3 times per day or OTC Voltaren 1% gel. I advised it is best to avoid NSAIDS if possible. He states he will start with Tylenol and was thankful for the advice.

## 2020-03-07 NOTE — Telephone Encounter (Signed)
Patient is on Eliquis, he is have knee pain.  He wants to know what he can take for the pain. Please advise.

## 2020-03-14 ENCOUNTER — Other Ambulatory Visit: Payer: Self-pay

## 2020-03-14 DIAGNOSIS — M25561 Pain in right knee: Secondary | ICD-10-CM | POA: Diagnosis not present

## 2020-03-14 DIAGNOSIS — M1711 Unilateral primary osteoarthritis, right knee: Secondary | ICD-10-CM | POA: Diagnosis not present

## 2020-03-14 MED ORDER — DOXAZOSIN MESYLATE 8 MG PO TABS
8.0000 mg | ORAL_TABLET | Freq: Every day | ORAL | 3 refills | Status: DC
Start: 2020-03-14 — End: 2021-01-25

## 2020-03-18 DIAGNOSIS — S5002XA Contusion of left elbow, initial encounter: Secondary | ICD-10-CM | POA: Diagnosis not present

## 2020-03-20 ENCOUNTER — Other Ambulatory Visit: Payer: Self-pay

## 2020-03-20 ENCOUNTER — Ambulatory Visit (INDEPENDENT_AMBULATORY_CARE_PROVIDER_SITE_OTHER): Payer: PPO | Admitting: Family Medicine

## 2020-03-20 VITALS — BP 127/71 | HR 86 | Temp 98.4°F | Wt 271.0 lb

## 2020-03-20 DIAGNOSIS — R351 Nocturia: Secondary | ICD-10-CM

## 2020-03-20 DIAGNOSIS — N401 Enlarged prostate with lower urinary tract symptoms: Secondary | ICD-10-CM | POA: Diagnosis not present

## 2020-03-20 DIAGNOSIS — S5002XA Contusion of left elbow, initial encounter: Secondary | ICD-10-CM | POA: Diagnosis not present

## 2020-03-20 DIAGNOSIS — R350 Frequency of micturition: Secondary | ICD-10-CM

## 2020-03-20 DIAGNOSIS — I872 Venous insufficiency (chronic) (peripheral): Secondary | ICD-10-CM | POA: Diagnosis not present

## 2020-03-20 LAB — POCT URINALYSIS DIPSTICK
Bilirubin, UA: NEGATIVE
Blood, UA: NEGATIVE
Glucose, UA: NEGATIVE
Ketones, UA: NEGATIVE
Nitrite, UA: NEGATIVE
Protein, UA: NEGATIVE
Spec Grav, UA: 1.02 (ref 1.010–1.025)
Urobilinogen, UA: 0.2 E.U./dL
pH, UA: 6 (ref 5.0–8.0)

## 2020-03-20 NOTE — Progress Notes (Signed)
Acute Office Visit  Subjective:    Patient ID: Jared Rice, male    DOB: 03-12-1933, 86 y.o.   MRN: [id number]  Chief Complaint  Patient presents with   Urinary Frequency    HPI  Patient presents for evaluation of bruise on his left arm/elbow.  He states he bumped it on the wall.  His insurance provided health nurse evaluated it and told him it would take a while to improve.  He is concerned now that it is turning red and wanted to have it checked for infection.  He has an area of  raised purple bruise/hematoma about 6cm X 4 cm.  An area of redness around it measuring 14 cm X 6 cm.  It is slightly tender to the touch with no warmth.    Patient also complains of urinary frequency.  He states he has nocturia x 3 each night.  He has no pain, odor, blood or other urinary symptoms to go with it.   Past Medical History:  Diagnosis Date   Actinic keratosis    Anginal pain ([place])    Basal cell carcinoma 02/06/2010   Left shoulder. Superficial.   Basal cell carcinoma 10/11/2013   Right medial forearm. Superficial   Basal cell carcinoma 04/10/2015   Right inf. lat. thigh. Superficial   Basal cell carcinoma 12/09/2016   Right cheek. Superficial.   Chronic airway obstruction, not elsewhere classified    Colon polyps    Coronary atherosclerosis of native coronary artery    nonobstructive   Diabetes mellitus without complication (HCC)    Essential hypertension, benign    Glucose intolerance (impaired glucose tolerance)    Hypothyroidism    Iron deficiency anemia    Knee pain, right    Occlusion and stenosis of carotid artery without mention of cerebral infarction    wears compression stockings   Other dyspnea and respiratory abnormality    w/ pseuodowheeze resolves with purse lip manuever   Precordial pain    Shoulder pain, right    Squamous cell carcinoma of skin 10/06/2007   Right forearm. SCCis   Squamous cell carcinoma of skin 10/11/2013   Left mid lat. pretibial. KA-like  pattern.   Tremor    Unspecified sleep apnea    Venous insufficiency    Wears dentures    full upper    Past Surgical History:  Procedure Laterality Date   CARDIAC CATHETERIZATION     COLONOSCOPY  03/17/12   Dr Byrnett-diverticulosis   ESOPHAGOGASTRODUODENOSCOPY (EGD) WITH PROPOFOL N/A 11/25/2014   Procedure: ESOPHAGOGASTRODUODENOSCOPY (EGD) WITH PROPOFOL;  Surgeon: Lucilla Lame, MD;  Location: [place];  Service: Endoscopy;  Laterality: N/A;  with biopsy   ESOPHAGOGASTRODUODENOSCOPY (EGD) WITH PROPOFOL N/A 01/05/2015   Procedure: ESOPHAGOGASTRODUODENOSCOPY (EGD) WITH PROPOFOL;  Surgeon: Lucilla Lame, MD;  Location: [place];  Service: Endoscopy;  Laterality: N/A;   EYE SURGERY Bilateral    cataract    TOTAL KNEE ARTHROPLASTY Left     Family History  Problem Relation Age of Onset   Heart failure Mother 2       congestive   Heart attack Father 66   Alcohol abuse Father    Alzheimer's disease Brother 71   Alzheimer's disease Sister 54   Colon cancer Neg Hx    Liver disease Neg Hx     Social History   Socioeconomic History   Marital status: Married    Spouse name: Not on file   Number of children: 2  Years of education: Not on file   Highest education level: Bachelor's degree (e.g., BA, AB, BS)  Occupational History   Occupation: retired    Comment: [employer]  Tobacco Use   Smoking status: Former Smoker    Packs/day: 2.00    Years: 31.00    Pack years: 62.00    Types: Cigarettes    Quit date: 12/24/1980    Years since quitting: 39.2   Smokeless tobacco: Never Used   Tobacco comment: quit in 1982  Vaping Use   Vaping Use: Never used  Substance and Sexual Activity   Alcohol use: No    Alcohol/week: 0.0 standard drinks   Drug use: No   Sexual activity: Not on file  Other Topics Concern   Not on file  Social History Narrative   Lives with wife, retired Engineer, drilling, 2 children, 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   Social  Determinants of Radio broadcast assistant Strain: Low Risk    Difficulty of Paying Living Expenses: Not hard at all  Food Insecurity: No Food Insecurity   Worried About Charity fundraiser in the Last Year: Never true   Arboriculturist in the Last Year: Never true  Transportation Needs: No Transportation Needs   Lack of Transportation (Medical): No   Lack of Transportation (Non-Medical): No  Physical Activity: Inactive   Days of Exercise per Week: 0 days   Minutes of Exercise per Session: 0 min  Stress: No Stress Concern Present   Feeling of Stress : Not at all  Social Connections: Socially Integrated   Frequency of Communication with Friends and Family: More than three times a week   Frequency of Social Gatherings with Friends and Family: Once a week   Attends Religious Services: More than 4 times per year   Active Member of [employer] or Organizations: Yes   Attends Music therapist: More than 4 times per year   Marital Status: Married  Human resources officer Violence: Not At Risk   Fear of Current or Ex-Partner: No   Emotionally Abused: No   Physically Abused: No   Sexually Abused: No    Outpatient Medications Prior to Visit  Medication Sig Dispense Refill   apixaban (ELIQUIS) 5 MG TABS tablet Take 1 tablet (5 mg total) by mouth 2 (two) times daily. 180 tablet 3   diphenhydramine-acetaminophen (TYLENOL PM) 25-500 MG TABS Take 2 tablets by mouth at bedtime as needed.     doxazosin (CARDURA) 8 MG tablet Take 1 tablet (8 mg total) by mouth daily. 90 tablet 3   fluconazole (DIFLUCAN) 200 MG tablet Take 1 tablet (200 mg total) by mouth 2 (two) times a week. Monday and Thursday 8 tablet 1   furosemide (LASIX) 20 MG tablet Take 2 tablets by mouth daily 180 tablet 2   Iodoquinol-HC-Aloe Polysacch (ALCORTIN A) 1-2-1 % GEL as needed.   3   levothyroxine (SYNTHROID) 75 MCG tablet Take 1 tablet by mouth daily in the morning 90 tablet 2   quinapril (ACCUPRIL) 20 MG tablet Take 1  tablet (20 mg total) by mouth daily. 90 tablet 3   rosuvastatin (CRESTOR) 20 MG tablet Take 1 tablet by mouth daily 90 tablet 0   topiramate (TOPAMAX) 50 MG tablet Take 1 tablet by mouth twice daily 60 tablet 5   pantoprazole (PROTONIX) 40 MG tablet Take 1 tablet (40 mg total) by mouth daily. (Patient not taking: Reported on 03/20/2020) 30 tablet 0   No facility-administered medications prior to  visit.    Allergies  Allergen Reactions   Dexilant [Dexlansoprazole] Rash   Primidone Itching and Rash    rash    Review of Systems  Genitourinary: Positive for frequency. Negative for decreased urine volume, dysuria, enuresis, hematuria and urgency.       Objective:    Physical Exam Constitutional:      General: He is not in acute distress.    Appearance: He is well-developed.  HENT:     Head: Normocephalic and atraumatic.     Right Ear: Hearing normal.     Left Ear: Hearing normal.     Nose: Nose normal.  Eyes:     General: Lids are normal. No scleral icterus.       Right eye: No discharge.        Left eye: No discharge.     Conjunctiva/sclera: Conjunctivae normal.  Cardiovascular:     Rate and Rhythm: Normal rate and regular rhythm.     Heart sounds: Normal heart sounds.  Pulmonary:     Effort: Pulmonary effort is normal. No respiratory distress.     Breath sounds: Normal breath sounds.  Abdominal:     General: Bowel sounds are normal.     Palpations: Abdomen is soft.  Musculoskeletal:        General: Normal range of motion.     Cervical back: Neck supple.  Skin:    Findings: No lesion or rash.     Comments: Hematoma over the left lateral elbow with multiple bruises and purpura lesions on extremities. Swelling of both lower legs from knees down. Fair pulses.  Neurological:     Mental Status: He is alert and oriented to person, place, and time.  Psychiatric:        Speech: Speech normal.        Behavior: Behavior normal.        Thought Content: Thought content normal.      BP 127/71 (BP Location: Right Arm, Patient Position: Sitting, Cuff Size: Normal)    Pulse 86    Temp 98.4 F (36.9 C) (Oral)    Wt 271 lb (122.9 kg)    SpO2 93%    BMI 34.79 kg/m  Wt Readings from Last 3 Encounters:  03/20/20 271 lb (122.9 kg)  02/21/20 258 lb 12.8 oz (117.4 kg)  02/14/20 259 lb (117.5 kg)    Health Maintenance Due  Topic Date Due   FOOT EXAM  Never done   COVID-19 Vaccine (1) Never done    There are no preventive care reminders to display for this patient.   Lab Results  Component Value Date   TSH 4.130 06/25/2019   Lab Results  Component Value Date   WBC 6.4 11/11/2019   HGB 11.6 (L) 11/11/2019   HCT 37.7 11/11/2019   MCV 95 11/11/2019   PLT 160 11/11/2019   Lab Results  Component Value Date   NA 141 11/11/2019   K 4.4 11/11/2019   CO2 27 11/11/2019   GLUCOSE 94 11/11/2019   BUN 21 11/11/2019   CREATININE 1.32 (H) 11/11/2019   BILITOT 0.4 11/11/2019   ALKPHOS 66 11/11/2019   AST 8 11/11/2019   ALT 5 11/11/2019   PROT 6.0 11/11/2019   ALBUMIN 3.9 11/11/2019   CALCIUM 8.6 11/11/2019   ANIONGAP 9 02/24/2019   Lab Results  Component Value Date   CHOL 104 06/25/2019   Lab Results  Component Value Date   HDL 42 06/25/2019   Lab Results  Component Value Date   LDLCALC 51 06/25/2019   Lab Results  Component Value Date   TRIG 45 06/25/2019   Lab Results  Component Value Date   CHOLHDL 2.5 06/25/2019   Lab Results  Component Value Date   HGBA1C 6.2 (A) 10/20/2019       Assessment & Plan:   1. Urinary frequency Increase in urinary frequency with nocturia the past several weeks. Urinalysis showed trace of leukocytes but no hematuria. States symptoms have been the same as past UTI's. No fever or dysuria today. Will check urine culture to decide on antibiotic need. - POCT urinalysis dipstick - Urine Culture  2. Benign prostatic hyperplasia with nocturia Some decrease in stream. No daytime dysuria or hematuria.  3.  Traumatic hematoma of left elbow, initial encounter Hit elbow on a wall and developed bruising with hematoma to the left elbow. No significant pain today. Apply ice pack for the first 48 hours intermittently. Should resolve over the next couple weeks.  4. Venous insufficiency of both lower extremities Swelling and pitting of lower legs from calves down. Tan coloration to skin and many scattered areas of purpura. Continue Lasix as prescribed and elevate as often as possible.    No orders of the defined types were placed in this encounter.  Andres Shad, PA, have reviewed all documentation for this visit. The documentation on 03/20/20 for the exam, diagnosis, procedures, and orders are all accurate and complete.   Juluis Mire, CMA

## 2020-03-21 ENCOUNTER — Telehealth: Payer: Self-pay | Admitting: Pharmacist

## 2020-03-21 NOTE — Progress Notes (Signed)
Chronic Care Management Pharmacy Assistant   Name: PATRICK SALEMI  MRN: [id number] DOB: Jun 22, 1932  Reason for Encounter: Medication Review  Patient Questions:  1.  Have you seen any other providers since your last visit? No  2.  Any changes in your medicines or health? No   Jared Rice,  86 y.o. , male presents for their Follow-Up CCM visit with the clinical pharmacist via telephone.  PCP : Jerrol Banana., MD  Allergies:   Allergies  Allergen Reactions  . Dexilant [Dexlansoprazole] Rash  . Primidone Itching and Rash    rash    Medications: Outpatient Encounter Medications as of 03/21/2020  Medication Sig  . apixaban (ELIQUIS) 5 MG TABS tablet Take 1 tablet (5 mg total) by mouth 2 (two) times daily.  . diphenhydramine-acetaminophen (TYLENOL PM) 25-500 MG TABS Take 2 tablets by mouth at bedtime as needed.  . doxazosin (CARDURA) 8 MG tablet Take 1 tablet (8 mg total) by mouth daily.  . fluconazole (DIFLUCAN) 200 MG tablet Take 1 tablet (200 mg total) by mouth 2 (two) times a week. Monday and Thursday  . furosemide (LASIX) 20 MG tablet Take 2 tablets by mouth daily  . Iodoquinol-HC-Aloe Polysacch (ALCORTIN A) 1-2-1 % GEL as needed.   [name] levothyroxine (SYNTHROID) 75 MCG tablet Take 1 tablet by mouth daily in the morning  . pantoprazole (PROTONIX) 40 MG tablet Take 1 tablet (40 mg total) by mouth daily. (Patient not taking: Reported on 03/20/2020)  . quinapril (ACCUPRIL) 20 MG tablet Take 1 tablet (20 mg total) by mouth daily.  . rosuvastatin (CRESTOR) 20 MG tablet Take 1 tablet by mouth daily  . topiramate (TOPAMAX) 50 MG tablet Take 1 tablet by mouth twice daily   No facility-administered encounter medications on file as of 03/21/2020.    Current Diagnosis: Patient Active Problem List   Diagnosis Date Noted  . Urinary frequency 11/22/2019  . Leukocytes in urine 11/22/2019  . Skin tear of forearm without complication, initial encounter- left 11/22/2019  .  Wound cellulitis- right lower leg anterior  10/29/2019  . Leg wound, right, sequela 10/29/2019  . Chronic fatigue 11/29/2015  . Benign prostatic hyperplasia 02/22/2015  . Gastrointestinal ulcer   . History of peptic ulcer   . Gastritis   . Hematochezia   . Acute gastric ulcer   . Melena 11/12/2014  . Malignant neoplasm of skin 09/21/2014  . Cataract 09/21/2014  . Colon polyp 09/21/2014  . CAFL (chronic airflow limitation) ([place]) 09/21/2014  . Diabetes ([place]) 09/21/2014  . DD (diverticular disease) 09/21/2014  . Benign essential tremor 09/21/2014  . Personal history of tobacco use, presenting hazards to health 09/21/2014  . Acid reflux 09/21/2014  . Hemorrhoids 09/21/2014  . 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 HLD (hyperlipidemia) 09/21/2014  . BP (high blood pressure) 09/21/2014  . Hypothyroidism 09/21/2014  . Lymphoma ([place]) 09/21/2014  . Malaise and fatigue 09/21/2014  . Mononeuritis 09/21/2014  . Muscle ache 09/21/2014  . Neuropathy 09/21/2014  . Numbness and tingling 09/21/2014  . Arthritis, degenerative 09/21/2014  . Adiposity 09/21/2014  . Awareness of heartbeats 09/21/2014  . Borderline diabetes 09/21/2014  . Acne erythematosa 09/21/2014  . Rotator cuff syndrome 09/21/2014  . Athlete's foot 09/21/2014  . Has a tremor 09/21/2014  . Stasis, venous 09/21/2014  . Avitaminosis D 09/21/2014  . Arthritis of knee, degenerative 11/16/2013  . Bradycardia, sinus 12/31/2011  . Left bundle branch block 12/31/2011  . First degree AV block 12/31/2011  .  Edema of both legs 12/31/2011  . COPD UNSPECIFIED 11/09/2009  . HYPERTENSION, BENIGN 10/03/2009  . SLEEP APNEA 10/03/2009  . DYSPNEA 10/03/2009  . CAD, NATIVE VESSEL 05/04/2009  . Carotid disease, bilateral ([place]) 05/04/2009    Goals Addressed   None     Follow-Up:  Coordination of Enhanced Pharmacy Services  Reviewed chart and adherence measures. Per insurance data, medication compliance for HTN 80-89%.

## 2020-03-22 LAB — URINE CULTURE

## 2020-03-24 ENCOUNTER — Telehealth: Payer: Self-pay

## 2020-03-24 MED ORDER — CIPROFLOXACIN HCL 500 MG PO TABS: 500.0000 mg | ORAL_TABLET | Freq: Two times a day (BID) | ORAL | 0 refills | Status: DC

## 2020-03-24 NOTE — Telephone Encounter (Signed)
-----   Message from [place], [place] sent at 03/23/2020  2:37 PM EDT ----- Urine culture grew a large number of bacterial colonies but no specific identification. Recommend Cipro 500 mg BID #20 and recheck if any urinary symptoms remain in 10-14 days.

## 2020-03-24 NOTE — Telephone Encounter (Signed)
Status:  Final result Visible to patient:  Yes (seen) Dx:  Urinary frequency

## 2020-04-03 DIAGNOSIS — M7542 Impingement syndrome of left shoulder: Secondary | ICD-10-CM | POA: Diagnosis not present

## 2020-04-03 DIAGNOSIS — M7582 Other shoulder lesions, left shoulder: Secondary | ICD-10-CM | POA: Diagnosis not present

## 2020-04-12 ENCOUNTER — Other Ambulatory Visit: Payer: Self-pay

## 2020-04-12 ENCOUNTER — Encounter: Payer: Self-pay | Admitting: Physician Assistant

## 2020-04-12 ENCOUNTER — Ambulatory Visit (INDEPENDENT_AMBULATORY_CARE_PROVIDER_SITE_OTHER): Payer: PPO | Admitting: Physician Assistant

## 2020-04-12 VITALS — BP 115/72 | HR 87 | Temp 99.4°F | Resp 16 | Wt 265.0 lb

## 2020-04-12 DIAGNOSIS — R3989 Other symptoms and signs involving the genitourinary system: Secondary | ICD-10-CM | POA: Diagnosis not present

## 2020-04-12 DIAGNOSIS — R350 Frequency of micturition: Secondary | ICD-10-CM

## 2020-04-12 LAB — POCT URINALYSIS DIPSTICK
Bilirubin, UA: NEGATIVE
Glucose, UA: NEGATIVE
Ketones, UA: NEGATIVE
Nitrite, UA: POSITIVE
Protein, UA: NEGATIVE
Spec Grav, UA: 1.01 (ref 1.010–1.025)
Urobilinogen, UA: 0.2 E.U./dL
pH, UA: 6 (ref 5.0–8.0)

## 2020-04-12 MED ORDER — CIPROFLOXACIN HCL 500 MG PO TABS: 500.0000 mg | ORAL_TABLET | Freq: Two times a day (BID) | ORAL | 0 refills | Status: DC

## 2020-04-12 NOTE — Progress Notes (Signed)
Established patient visit   Patient: Jared Rice   DOB: 10-04-1932   86 y.o. Male  MRN: [id number] Visit Date: 04/12/2020  Today's healthcare provider: Mar Daring, PA-C   No chief complaint on file.  Subjective    Urinary Frequency  This is a recurrent problem. The current episode started 1 to 4 weeks ago (3 weeks ago). The problem has been unchanged. There has been no fever. Associated symptoms include frequency and urgency ("as soon as he stands up"). Pertinent negatives include no chills, discharge, flank pain, hematuria, nausea or vomiting. Associated symptoms comments: shakiness. He has tried nothing for the symptoms.  He was seen on 03/20/20 for similar symptoms. Culture grew out large colonies but no specific bacteria. Cipro was called in but was never picked up.    Reports that a few years ago he had some shaking spells and he went to the ED and he had a UTI and was given IV antibiotic.  Patient Active Problem List   Diagnosis Date Noted   Urinary frequency 11/22/2019   Leukocytes in urine 11/22/2019   Skin tear of forearm without complication, initial encounter- left 11/22/2019   Wound cellulitis- right lower leg anterior  10/29/2019   Leg wound, right, sequela 10/29/2019   Chronic fatigue 11/29/2015   Benign prostatic hyperplasia 02/22/2015   Gastrointestinal ulcer    History of peptic ulcer    Gastritis    Hematochezia    Acute gastric ulcer    Melena 11/12/2014   Malignant neoplasm of skin 09/21/2014   Cataract 09/21/2014   Colon polyp 09/21/2014   CAFL (chronic airflow limitation) (HCC) 09/21/2014   Diabetes ([place]) 09/21/2014   DD (diverticular disease) 09/21/2014   Benign essential tremor 09/21/2014   Personal history of tobacco use, presenting hazards to health 09/21/2014   Acid reflux 09/21/2014   Hemorrhoids 09/21/2014   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   HLD (hyperlipidemia) 09/21/2014   BP (high blood pressure)  09/21/2014   Hypothyroidism 09/21/2014   Lymphoma ([place]) 09/21/2014   Malaise and fatigue 09/21/2014   Mononeuritis 09/21/2014   Muscle ache 09/21/2014   Neuropathy 09/21/2014   Numbness and tingling 09/21/2014   Arthritis, degenerative 09/21/2014   Adiposity 09/21/2014   Awareness of heartbeats 09/21/2014   Borderline diabetes 09/21/2014   Acne erythematosa 09/21/2014   Rotator cuff syndrome 09/21/2014   Athlete's foot 09/21/2014   Has a tremor 09/21/2014   Stasis, venous 09/21/2014   Avitaminosis D 09/21/2014   Arthritis of knee, degenerative 11/16/2013   Bradycardia, sinus 12/31/2011   Left bundle branch block 12/31/2011   First degree AV block 12/31/2011   Edema of both legs 12/31/2011   COPD UNSPECIFIED 11/09/2009   HYPERTENSION, BENIGN 10/03/2009   SLEEP APNEA 10/03/2009   DYSPNEA 10/03/2009   CAD, NATIVE VESSEL 05/04/2009   Carotid disease, bilateral ([place]) 05/04/2009   Past Medical History:  Diagnosis Date   Actinic keratosis    Anginal pain ([place])    Basal cell carcinoma 02/06/2010   Left shoulder. Superficial.   Basal cell carcinoma 10/11/2013   Right medial forearm. Superficial   Basal cell carcinoma 04/10/2015   Right inf. lat. thigh. Superficial   Basal cell carcinoma 12/09/2016   Right cheek. Superficial.   Chronic airway obstruction, not elsewhere classified    Colon polyps    Coronary atherosclerosis of native coronary artery    nonobstructive   Diabetes mellitus without complication (HCC)    Essential hypertension, benign    Glucose  intolerance (impaired glucose tolerance)    Hypothyroidism    Iron deficiency anemia    Knee pain, right    Occlusion and stenosis of carotid artery without mention of cerebral infarction    wears compression stockings   Other dyspnea and respiratory abnormality    w/ pseuodowheeze resolves with purse lip manuever   Precordial pain    Shoulder pain, right    Squamous  cell carcinoma of skin 10/06/2007   Right forearm. SCCis   Squamous cell carcinoma of skin 10/11/2013   Left mid lat. pretibial. KA-like pattern.   Tremor    Unspecified sleep apnea    Venous insufficiency    Wears dentures    full upper       Medications: Outpatient Medications Prior to Visit  Medication Sig   apixaban (ELIQUIS) 5 MG TABS tablet Take 1 tablet (5 mg total) by mouth 2 (two) times daily.   diphenhydramine-acetaminophen (TYLENOL PM) 25-500 MG TABS Take 2 tablets by mouth at bedtime as needed.   doxazosin (CARDURA) 8 MG tablet Take 1 tablet (8 mg total) by mouth daily.   fluconazole (DIFLUCAN) 200 MG tablet Take 1 tablet (200 mg total) by mouth 2 (two) times a week. Monday and Thursday   furosemide (LASIX) 20 MG tablet Take 2 tablets by mouth daily   Iodoquinol-HC-Aloe Polysacch (ALCORTIN A) 1-2-1 % GEL as needed.    levothyroxine (SYNTHROID) 75 MCG tablet Take 1 tablet by mouth daily in the morning   quinapril (ACCUPRIL) 20 MG tablet Take 1 tablet (20 mg total) by mouth daily.   rosuvastatin (CRESTOR) 20 MG tablet Take 1 tablet by mouth daily (Patient taking differently: Patient takes 10mg )   topiramate (TOPAMAX) 50 MG tablet Take 1 tablet by mouth twice daily   pantoprazole (PROTONIX) 40 MG tablet Take 1 tablet (40 mg total) by mouth daily. (Patient not taking: Reported on 03/20/2020)   [DISCONTINUED] ciprofloxacin (CIPRO) 500 MG tablet Take 1 tablet (500 mg total) by mouth 2 (two) times daily. (Patient not taking: Reported on 04/12/2020)   No facility-administered medications prior to visit.    Review of Systems  Constitutional: Negative for chills.  Gastrointestinal: Negative for abdominal pain, nausea and vomiting.  Genitourinary: Positive for frequency and urgency ("as soon as he stands up"). Negative for flank pain and hematuria.    Last CBC Lab Results  Component Value Date   WBC 6.4 11/11/2019   HGB 11.6 (L) 11/11/2019   HCT 37.7  11/11/2019   MCV 95 11/11/2019   MCH 29.2 11/11/2019   RDW 13.7 11/11/2019   PLT 160 03/50/0938   Last metabolic panel Lab Results  Component Value Date   GLUCOSE 94 11/11/2019   NA 141 11/11/2019   K 4.4 11/11/2019   CL 102 11/11/2019   CO2 27 11/11/2019   BUN 21 11/11/2019   CREATININE 1.32 (H) 11/11/2019   GFRNONAA 49 (L) 11/11/2019   GFRAA 56 (L) 11/11/2019   CALCIUM 8.6 11/11/2019   PHOS 3.3 12/25/2016   PROT 6.0 11/11/2019   ALBUMIN 3.9 11/11/2019   LABGLOB 2.1 11/11/2019   AGRATIO 1.9 11/11/2019   BILITOT 0.4 11/11/2019   ALKPHOS 66 11/11/2019   AST 8 11/11/2019   ALT 5 11/11/2019   ANIONGAP 9 02/24/2019      Objective    BP 115/72 (BP Location: Right Arm, Patient Position: Sitting, Cuff Size: Large)    Pulse 87    Temp 99.4 F (37.4 C) (Oral)    Resp 16  Wt 265 lb (120.2 kg)    BMI 34.02 kg/m  BP Readings from Last 3 Encounters:  04/12/20 115/72  03/20/20 127/71  02/21/20 104/80   Wt Readings from Last 3 Encounters:  04/12/20 265 lb (120.2 kg)  03/20/20 271 lb (122.9 kg)  02/21/20 258 lb 12.8 oz (117.4 kg)      Physical Exam Vitals reviewed.  Constitutional:      General: He is not in acute distress.    Appearance: Normal appearance. He is well-developed. He is obese. He is not ill-appearing or diaphoretic.  Cardiovascular:     Rate and Rhythm: Normal rate and regular rhythm.     Heart sounds: Normal heart sounds. No murmur heard.  No friction rub. No gallop.   Pulmonary:     Effort: Pulmonary effort is normal. No respiratory distress.     Breath sounds: Normal breath sounds. No wheezing or rales.  Abdominal:     General: Bowel sounds are normal. There is no distension.     Palpations: Abdomen is soft. There is no mass.     Tenderness: There is no abdominal tenderness. There is no right CVA tenderness, left CVA tenderness, guarding or rebound.  Skin:    General: Skin is warm and dry.  Neurological:     Mental Status: He is alert and  oriented to person, place, and time.      Results for orders placed or performed in visit on 04/12/20  POCT urinalysis dipstick  Result Value Ref Range   Color, UA Yellow    Clarity, UA Cloudy    Glucose, UA Negative Negative   Bilirubin, UA Negative    Ketones, UA Negative    Spec Grav, UA 1.010 1.010 - 1.025   Blood, UA H.Trace    pH, UA 6.0 5.0 - 8.0   Protein, UA Negative Negative   Urobilinogen, UA 0.2 0.2 or 1.0 E.U./dL   Nitrite, UA Positive    Leukocytes, UA Small (1+) (A) Negative   Appearance     Odor      Assessment & Plan     1. Frequent urination UA positive, now with nitrites. Culture sent. - Urine Culture  2. Suspected UTI Worsening symptoms. UA positive. Will treat empirically with Cipro. Continue to push fluids. Urine sent for culture. Will follow up pending C&S results. She is to call if symptoms do not improve or if they worsen.  - ciprofloxacin (CIPRO) 500 MG tablet; Take 1 tablet (500 mg total) by mouth 2 (two) times daily.  Dispense: 20 tablet; Refill: 0   No follow-ups on file.      Reynolds Bowl, PA-C, have reviewed all documentation for this visit. The documentation on 04/12/20 for the exam, diagnosis, procedures, and orders are all accurate and complete.   Rubye Beach  [facility] [phone] (phone) [phone] (fax)  [place]

## 2020-04-19 LAB — URINE CULTURE

## 2020-04-26 ENCOUNTER — Ambulatory Visit (INDEPENDENT_AMBULATORY_CARE_PROVIDER_SITE_OTHER): Payer: PPO

## 2020-04-26 ENCOUNTER — Other Ambulatory Visit: Payer: Self-pay

## 2020-04-26 DIAGNOSIS — Z23 Encounter for immunization: Secondary | ICD-10-CM

## 2020-05-02 DIAGNOSIS — M1711 Unilateral primary osteoarthritis, right knee: Secondary | ICD-10-CM | POA: Diagnosis not present

## 2020-05-04 ENCOUNTER — Telehealth: Payer: Self-pay

## 2020-05-04 NOTE — Telephone Encounter (Signed)
   [facility] Pre-operative Risk Assessment    NOT NEEDED

## 2020-05-11 ENCOUNTER — Telehealth: Payer: Self-pay | Admitting: Cardiology

## 2020-05-11 NOTE — Telephone Encounter (Signed)
    Pt is following up the clearance for his knee surgery, he said [facility] sent a clearance request and they haven't heard anything from [place]. He said he needs the clearance so they can schedule him for the surgery

## 2020-05-11 NOTE — Telephone Encounter (Signed)
Called and left message with operator to re-fax their cardiac clearance to make sure that they include anesthesia and mediction, etc

## 2020-05-12 ENCOUNTER — Telehealth: Payer: Self-pay

## 2020-05-12 DIAGNOSIS — I4891 Unspecified atrial fibrillation: Secondary | ICD-10-CM | POA: Insufficient documentation

## 2020-05-12 NOTE — Telephone Encounter (Signed)
   [facility] Pre-operative Risk Assessment    HEARTCARE STAFF: - Please ensure there is not already an duplicate clearance open for this procedure. - Under Visit Info/Reason for Call, type in Other and utilize the format Clearance MM/DD/YY or Clearance TBD. Do not use dashes or single digits. - If request is for dental extraction, please clarify the # of teeth to be extracted.  Request for surgical clearance:  1. What type of surgery is being performed? Right Total Knee Arthroplasty  2. When is this surgery scheduled? TBD   3. What type of clearance is required (medical clearance vs. Pharmacy clearance to hold med vs. Both)? Both  4. Are there any medications that need to be held prior to surgery and how long? Eliquis, instructions for holding before and after procedure   5. Practice name and name of physician performing surgery? [facility], Dr. Abner Greenspan   6. What is the office phone number? [phone]   7.   What is the office fax number? [phone]  8.   Anesthesia type (None, local, MAC, general) ? Choice, hopefully spinal if possible then General if unable to do Spinal   Jacqulynn Cadet 05/12/2020, 4:28 PM  _________________________________________________________________   (provider comments below)

## 2020-05-12 NOTE — Telephone Encounter (Signed)
Patient with diagnosis of A Fib on Eliquis for anticoagulation.    Procedure: Right Total Knee Arthroplasty  Date of procedure: TBD  { CHA2DS2-VASc Score = 4  This indicates a 4.8% annual risk of stroke. The patient's score is based upon: CHF History: No HTN History: Yes Diabetes History: No (listed on problem list but unclear he actually has DM) Stroke History: No Vascular Disease History: Yes Age Score: 2 Gender Score: 0    CrCl 49 mL/min using adjusted body weight Platelet count 160K  Per office protocol, patient can hold Eliquis for 3 days prior to procedure.    Patient will not need bridging with Lovenox (enoxaparin) around procedure.  If not bridging, patient should restart Eliquis on the evening of procedure or day after, at discretion of procedure MD

## 2020-05-15 NOTE — Telephone Encounter (Signed)
Called pt and scheduled appt 1-19 and placed pt on the wait list for soover appt

## 2020-05-15 NOTE — Telephone Encounter (Signed)
Primary Cardiologist:Peter Martinique, MD  Chart reviewed as part of pre-operative protocol coverage. Because of Jared Rice's past medical history and time since last visit, he/she will require a follow-up visit in order to better assess preoperative cardiovascular risk.  Pre-op covering staff: - Please schedule appointment and call patient to inform them. - Please contact requesting surgeon's office via preferred method (i.e, phone, fax) to inform them of need for appointment prior to surgery.  If applicable, this message will also be routed to pharmacy pool and/or primary cardiologist for input on holding anticoagulant/antiplatelet agent as requested below so that this information is available at time of patient's appointment.   Deberah Pelton, NP  05/15/2020, 7:18 AM

## 2020-05-31 ENCOUNTER — Other Ambulatory Visit: Payer: Self-pay

## 2020-05-31 ENCOUNTER — Ambulatory Visit (INDEPENDENT_AMBULATORY_CARE_PROVIDER_SITE_OTHER): Payer: PPO

## 2020-05-31 DIAGNOSIS — Z0000 Encounter for general adult medical examination without abnormal findings: Secondary | ICD-10-CM | POA: Diagnosis not present

## 2020-05-31 NOTE — Patient Instructions (Signed)
Mr. Jared Rice , Thank you for taking time to come for your Medicare Wellness Visit. I appreciate your ongoing commitment to your health goals. Please review the following plan we discussed and let me know if I can assist you in the future.   Screening recommendations/referrals: Colonoscopy: No longer required.  Recommended yearly ophthalmology/optometry visit for glaucoma screening and checkup Recommended yearly dental visit for hygiene and checkup  Vaccinations: Influenza vaccine: Done 02/14/20 Pneumococcal vaccine: Completed series Tdap vaccine: Up to date, due 01/2030 Shingles vaccine: Completed series    Advanced directives: Currently on file.  Conditions/risks identified: Fall risk preventatives discussed today.   Next appointment: 06/09/20 @ 1:30 PM with Dr Sullivan Lone   Preventive Care 65 Years and Older, Male Preventive care refers to lifestyle choices and visits with your health care provider that can promote health and wellness. What does preventive care include?  A yearly physical exam. This is also called an annual well check.  Dental exams once or twice a year.  Routine eye exams. Ask your health care provider how often you should have your eyes checked.  Personal lifestyle choices, including:  Daily care of your teeth and gums.  Regular physical activity.  Eating a healthy diet.  Avoiding tobacco and drug use.  Limiting alcohol use.  Practicing safe sex.  Taking low doses of aspirin every day.  Taking vitamin and mineral supplements as recommended by your health care provider. What happens during an annual well check? The services and screenings done by your health care provider during your annual well check will depend on your age, overall health, lifestyle risk factors, and family history of disease. Counseling  Your health care provider may ask you questions about your:  Alcohol use.  Tobacco use.  Drug use.  Emotional well-being.  Home and  relationship well-being.  Sexual activity.  Eating habits.  History of falls.  Memory and ability to understand (cognition).  Work and work Astronomer. Screening  You may have the following tests or measurements:  Height, weight, and BMI.  Blood pressure.  Lipid and cholesterol levels. These may be checked every 5 years, or more frequently if you are over 68 years old.  Skin check.  Lung cancer screening. You may have this screening every year starting at age 18 if you have a 30-pack-year history of smoking and currently smoke or have quit within the past 15 years.  Fecal occult blood test (FOBT) of the stool. You may have this test every year starting at age 29.  Flexible sigmoidoscopy or colonoscopy. You may have a sigmoidoscopy every 5 years or a colonoscopy every 10 years starting at age 78.  Prostate cancer screening. Recommendations will vary depending on your family history and other risks.  Hepatitis C blood test.  Hepatitis B blood test.  Sexually transmitted disease (STD) testing.  Diabetes screening. This is done by checking your blood sugar (glucose) after you have not eaten for a while (fasting). You may have this done every 1-3 years.  Abdominal aortic aneurysm (AAA) screening. You may need this if you are a current or former smoker.  Osteoporosis. You may be screened starting at age 20 if you are at high risk. Talk with your health care provider about your test results, treatment options, and if necessary, the need for more tests. Vaccines  Your health care provider may recommend certain vaccines, such as:  Influenza vaccine. This is recommended every year.  Tetanus, diphtheria, and acellular pertussis (Tdap, Td) vaccine. You may need  a Td booster every 10 years.  Zoster vaccine. You may need this after age 75.  Pneumococcal 13-valent conjugate (PCV13) vaccine. One dose is recommended after age 5.  Pneumococcal polysaccharide (PPSV23) vaccine. One  dose is recommended after age 80. Talk to your health care provider about which screenings and vaccines you need and how often you need them. This information is not intended to replace advice given to you by your health care provider. Make sure you discuss any questions you have with your health care provider. Document Released: 06/09/2015 Document Revised: 01/31/2016 Document Reviewed: 03/14/2015 Elsevier Interactive Patient Education  2017 [place] Prevention in the Home Falls can cause injuries. They can happen to people of all ages. There are many things you can do to make your home safe and to help prevent falls. What can I do on the outside of my home?  Regularly fix the edges of walkways and driveways and fix any cracks.  Remove anything that might make you trip as you walk through a door, such as a raised step or threshold.  Trim any bushes or trees on the path to your home.  Use bright outdoor lighting.  Clear any walking paths of anything that might make someone trip, such as rocks or tools.  Regularly check to see if handrails are loose or broken. Make sure that both sides of any steps have handrails.  Any raised decks and porches should have guardrails on the edges.  Have any leaves, snow, or ice cleared regularly.  Use sand or salt on walking paths during winter.  Clean up any spills in your garage right away. This includes oil or grease spills. What can I do in the bathroom?  Use night lights.  Install grab bars by the toilet and in the tub and shower. Do not use towel bars as grab bars.  Use non-skid mats or decals in the tub or shower.  If you need to sit down in the shower, use a plastic, non-slip stool.  Keep the floor dry. Clean up any water that spills on the floor as soon as it happens.  Remove soap buildup in the tub or shower regularly.  Attach bath mats securely with double-sided non-slip rug tape.  Do not have throw rugs and other  things on the floor that can make you trip. What can I do in the bedroom?  Use night lights.  Make sure that you have a light by your bed that is easy to reach.  Do not use any sheets or blankets that are too big for your bed. They should not hang down onto the floor.  Have a firm chair that has side arms. You can use this for support while you get dressed.  Do not have throw rugs and other things on the floor that can make you trip. What can I do in the kitchen?  Clean up any spills right away.  Avoid walking on wet floors.  Keep items that you use a lot in easy-to-reach places.  If you need to reach something above you, use a strong step stool that has a grab bar.  Keep electrical cords out of the way.  Do not use floor polish or wax that makes floors slippery. If you must use wax, use non-skid floor wax.  Do not have throw rugs and other things on the floor that can make you trip. What can I do with my stairs?  Do not leave any items on the  stairs.  Make sure that there are handrails on both sides of the stairs and use them. Fix handrails that are broken or loose. Make sure that handrails are as long as the stairways.  Check any carpeting to make sure that it is firmly attached to the stairs. Fix any carpet that is loose or worn.  Avoid having throw rugs at the top or bottom of the stairs. If you do have throw rugs, attach them to the floor with carpet tape.  Make sure that you have a light switch at the top of the stairs and the bottom of the stairs. If you do not have them, ask someone to add them for you. What else can I do to help prevent falls?  Wear shoes that:  Do not have high heels.  Have rubber bottoms.  Are comfortable and fit you well.  Are closed at the toe. Do not wear sandals.  If you use a stepladder:  Make sure that it is fully opened. Do not climb a closed stepladder.  Make sure that both sides of the stepladder are locked into place.  Ask  someone to hold it for you, if possible.  Clearly mark and make sure that you can see:  Any grab bars or handrails.  First and last steps.  Where the edge of each step is.  Use tools that help you move around (mobility aids) if they are needed. These include:  Canes.  Walkers.  Scooters.  Crutches.  Turn on the lights when you go into a dark area. Replace any light bulbs as soon as they burn out.  Set up your furniture so you have a clear path. Avoid moving your furniture around.  If any of your floors are uneven, fix them.  If there are any pets around you, be aware of where they are.  Review your medicines with your doctor. Some medicines can make you feel dizzy. This can increase your chance of falling. Ask your doctor what other things that you can do to help prevent falls. This information is not intended to replace advice given to you by your health care provider. Make sure you discuss any questions you have with your health care provider. Document Released: 03/09/2009 Document Revised: 10/19/2015 Document Reviewed: 06/17/2014 Elsevier Interactive Patient Education  2017 [employer].

## 2020-05-31 NOTE — Progress Notes (Signed)
Subjective:   Jared Rice is a 87 y.o. male who presents for Medicare Annual/Subsequent preventive examination.  I connected with [name] today by telephone and verified that I am speaking with the correct person using two identifiers. Location patient: home Location provider: work Persons participating in the virtual visit: patient, provider.   I discussed the limitations, risks, security and privacy concerns of performing an evaluation and management service by telephone and the availability of in person appointments. I also discussed with the patient that there may be a patient responsible charge related to this service. The patient expressed understanding and verbally consented to this telephonic visit.    Interactive audio and video telecommunications were attempted between this provider and patient, however failed, due to patient having technical difficulties OR patient did not have access to video capability.  We continued and completed visit with audio only.   Review of Systems    N/A  Cardiac Risk Factors include: advanced age (>21mn, >>41women);dyslipidemia;hypertension;male gender     Objective:    There were no vitals filed for this visit. There is no height or weight on file to calculate BMI.  Advanced Directives 05/31/2020 05/24/2019 02/24/2019 05/18/2018 05/15/2017 07/16/2016 06/22/2015  Does Patient Have a Medical Advance Directive? Yes Yes Yes Yes Yes Yes Yes  Type of AParamedicof AConstantineLiving will HOlde West [place]Living will HBrookhavenLiving will HGaryvilleLiving will HBlue SkyLiving will Living will Living will;Healthcare Power of Attorney  Does patient want to make changes to medical advance directive? - - No - Patient declined - - No - Patient declined -  Copy of HBenedictin Chart? Yes - validated most recent copy scanned in chart (See row  information) Yes - validated most recent copy scanned in chart (See row information) No - copy requested Yes - validated most recent copy scanned in chart (See row information) No - copy requested - -  Would patient like information on creating a medical advance directive? - - No - Patient declined - - - -    Current Medications (verified) Outpatient Encounter Medications as of 05/31/2020  Medication Sig  . apixaban (ELIQUIS) 5 MG TABS tablet Take 1 tablet (5 mg total) by mouth 2 (two) times daily.  . diphenhydramine-acetaminophen (TYLENOL PM) 25-500 MG TABS Take 2 tablets by mouth at bedtime as needed.  . doxazosin (CARDURA) 8 MG tablet Take 1 tablet (8 mg total) by mouth daily.  . furosemide (LASIX) 20 MG tablet Take 2 tablets by mouth daily (Patient taking differently: 20 mg.)  . Iodoquinol-HC-Aloe Polysacch (ALCORTIN A) 1-2-1 % GEL as needed.  .Marland Kitchenlevothyroxine (SYNTHROID) 75 MCG tablet Take 1 tablet by mouth daily in the morning  . quinapril (ACCUPRIL) 20 MG tablet Take 1 tablet (20 mg total) by mouth daily.  . rosuvastatin (CRESTOR) 20 MG tablet Take 1 tablet by mouth daily (Patient taking differently: Patient takes 146m  . topiramate (TOPAMAX) 50 MG tablet Take 1 tablet by mouth twice daily  . ciprofloxacin (CIPRO) 500 MG tablet Take 1 tablet (500 mg total) by mouth 2 (two) times daily.  . fluconazole (DIFLUCAN) 200 MG tablet Take 1 tablet (200 mg total) by mouth 2 (two) times a week. Monday and Thursday   No facility-administered encounter medications on file as of 05/31/2020.    Allergies (verified) Dexilant [dexlansoprazole] and Primidone   History: Past Medical History:  Diagnosis Date  . Actinic keratosis   .  Anginal pain ([place])   . Basal cell carcinoma 02/06/2010   Left shoulder. Superficial.  . Basal cell carcinoma 10/11/2013   Right medial forearm. Superficial  . Basal cell carcinoma 04/10/2015   Right inf. lat. thigh. Superficial  . Basal cell carcinoma 12/09/2016    Right cheek. Superficial.  . Chronic airway obstruction, not elsewhere classified   . Colon polyps   . Coronary atherosclerosis of native coronary artery    nonobstructive  . Diabetes mellitus without complication (Accident)   . Essential hypertension, benign   . Glucose intolerance (impaired glucose tolerance)   . Hyperlipidemia   . Hypothyroidism   . Iron deficiency anemia   . Knee pain, right   . Occlusion and stenosis of carotid artery without mention of cerebral infarction    wears compression stockings  . Other dyspnea and respiratory abnormality    w/ pseuodowheeze resolves with purse lip manuever  . Precordial pain   . Shoulder pain, right   . Squamous cell carcinoma of skin 10/06/2007   Right forearm. SCCis  . Squamous cell carcinoma of skin 10/11/2013   Left mid lat. pretibial. KA-like pattern.  . Tremor   . Unspecified sleep apnea   . Venous insufficiency   . Wears dentures    full upper   Past Surgical History:  Procedure Laterality Date  . CARDIAC CATHETERIZATION    . COLONOSCOPY  03/17/12   Dr Byrnett-diverticulosis  . ESOPHAGOGASTRODUODENOSCOPY (EGD) WITH PROPOFOL N/A 11/25/2014   Procedure: ESOPHAGOGASTRODUODENOSCOPY (EGD) WITH PROPOFOL;  Surgeon: Lucilla Lame, MD;  Location: [facility];  Service: Endoscopy;  Laterality: N/A;  with biopsy  . ESOPHAGOGASTRODUODENOSCOPY (EGD) WITH PROPOFOL N/A 01/05/2015   Procedure: ESOPHAGOGASTRODUODENOSCOPY (EGD) WITH PROPOFOL;  Surgeon: Lucilla Lame, MD;  Location: [facility];  Service: Endoscopy;  Laterality: N/A;  . EYE SURGERY Bilateral    cataract   . TOTAL KNEE ARTHROPLASTY Left    Family History  Problem Relation Age of Onset  . Heart failure Mother 34       congestive  . Heart attack Father 25  . Alcohol abuse Father   . Alzheimer's disease Brother 19  . Alzheimer's disease Sister 51  . Colon cancer Neg Hx   . Liver disease Neg Hx    Social History   Socioeconomic History  . Marital status:  Married    Spouse name: Not on file  . Number of children: 2  . Years of education: Not on file  . Highest education level: Bachelor's degree (e.g., BA, AB, BS)  Occupational History  . Occupation: retired    Comment: [employer]  . Smoking status: Former Smoker    Packs/day: 2.00    Years: 31.00    Pack years: 62.00    Types: Cigarettes    Quit date: 12/24/1980    Years since quitting: 39.4  . Smokeless tobacco: Never Used  . Tobacco comment: quit in 1982  Vaping Use  . Vaping Use: Never used  Substance and Sexual Activity  . Alcohol use: No    Alcohol/week: 0.0 standard drinks  . Drug use: No  . Sexual activity: Not on file  Other Topics Concern  . Not on file  Social History Narrative   Lives with wife, retired Engineer, drilling, 2 children, 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   Social Determinants of Health   Financial Resource Strain: [place]   . Difficulty of Paying Living Expenses: Not hard at all  Food Insecurity: No Food Insecurity  .  Worried About Charity fundraiser in the Last Year: Never true  . Ran Out of Food in the Last Year: Never true  Transportation Needs: No Transportation Needs  . Lack of Transportation (Medical): No  . Lack of Transportation (Non-Medical): No  Physical Activity: Inactive  . Days of Exercise per Week: 0 days  . Minutes of Exercise per Session: 0 min  Stress: No Stress Concern Present  . Feeling of Stress : Not at all  Social Connections: Moderately Integrated  . Frequency of Communication with Friends and Family: More than three times a week  . Frequency of Social Gatherings with Friends and Family: More than three times a week  . Attends Religious Services: Never  . Active Member of Clubs or Organizations: Yes  . Attends Archivist Meetings: More than 4 times per year  . Marital Status: Married    Tobacco Counseling Counseling given: Not Answered Comment: quit in 1982   Clinical  Intake:  Pre-visit preparation completed: Yes  Pain : No/denies pain (Has right knee pain when he gets up and moves around.)     Nutritional Risks: None Diabetes:  (Prediabetic)  How often do you need to have someone help you when you read instructions, pamphlets, or other written materials from your doctor or pharmacy?: 1 - Never  Diabetic? Prediabetic      Interpreter Needed?: No  Information entered by :: [facility], LPN   Activities of Daily Living In your present state of health, do you have any difficulty performing the following activities: 05/31/2020 03/20/2020  Hearing? N Y  Vision? N N  Difficulty concentrating or making decisions? N N  Walking or climbing stairs? N N  Dressing or bathing? N N  Doing errands, shopping? N N  Preparing Food and eating ? N -  Using the Toilet? N -  In the past six months, have you accidently leaked urine? Y -  Comment Occasionally with urges. -  Do you have problems with loss of bowel control? N -  Managing your Medications? N -  Managing your Finances? N -  Housekeeping or managing your Housekeeping? N -  Some recent data might be hidden    Patient Care Team: Jerrol Banana., MD as PCP - General (Unknown Physician Specialty) Martinique, Peter M, MD as PCP - Cardiology (Cardiology) Dingeldein, Remo Lipps, MD as Consulting Physician (Ophthalmology) Ralene Bathe, MD (Dermatology) Marry Guan, Laurice Record, MD (Orthopedic Surgery) Caroline More, DPM as Consulting Physician (Podiatry)  Indicate any recent Medical Services you may have received from other than [facility] providers in the past year (date may be approximate).     Assessment:   This is a routine wellness examination for Jared Rice.  Hearing/Vision screen No exam data present  Dietary issues and exercise activities discussed: Current Exercise Habits: The patient does not participate in regular exercise at present, Exercise limited by: orthopedic condition(s)  Goals    .  Exercise 3x per week (30 min per time)     Recommend to start exercising 3 times a week for 30 minutes at a time.     . Hyperlipidemia - goal LDL < 70     CARE PLAN ENTRY (see longitudinal plan of care for additional care plan information)  Current Barriers:  . Controlled hyperlipidemia, complicated by hypertension, hyperglycemia . Current antihyperlipidemic regimen: Crestor 44m daily . Previous antihyperlipidemic medications tried NA . Most recent lipid panel:     Component Value Date/Time   CHOL 104 06/25/2019 0908  TRIG 45 06/25/2019 0908   HDL 42 06/25/2019 0908   CHOLHDL 2.5 06/25/2019 0908   LDLCALC 51 06/25/2019 0908 .   [name] ASCVD risk enhancing conditions: age >62, DM, HTN . 10-year ASCVD risk score: NA  Pharmacist Clinical Goal(s):  [name] Over the next 90 days, patient will work with PharmD and providers towards continuing optimized antihyperlipidemic therapy  Interventions: . Comprehensive medication review performed; medication list updated in electronic medical record.  Bertram Savin care team collaboration (see longitudinal plan of care)  Patient Self Care Activities:  . Patient will focus on medication adherence by ensuring medication list reflects how he is actually taking the rosuvastatin  Initial goal documentation     . Hypertension - goal BP < 140/90     CARE PLAN ENTRY (see longitudinal plan of care for additional care plan information)  Current Barriers:  . Controlled hypertension, complicated by hyperlipidemia, obesity . Current antihypertensive regimen: quinapril 83m daily, doxazosin 852mdaily . Previous antihypertensives tried: NA . Last practice recorded BP readings:  BP Readings from Last 3 Encounters:  08/04/19 133/74  06/22/19 (!) 142/81  05/13/19 122/74 .   . Marland Kitchenurrent home BP readings: doesn't check . Most recent eGFR/CrCl: No results found for: EGFR  No components found for: CRCL  Pharmacist Clinical Goal(s):  . Marland Kitchenver the next 90  days, patient will work with PharmD and providers to continue optimized antihypertensive regimen  Interventions: . Inter-disciplinary care team collaboration (see longitudinal plan of care) . Comprehensive medication review performed; medication list updated in the electronic medical record.   Patient Self Care Activities:  . Patient will check BP and pulse, document, and provide at future appointments . Patient will focus on medication adherence by checking BP at home for the next 30 days  Initial goal documentation     . Pre-diabetes - goal A1c < 6.5%     CARE PLAN ENTRY (see longitudinal plan of care for additional care plan information)  Current Barriers:  . Pre-diabetes: type 2; complicated by chronic medical conditions including hypertension, hyperlipidemia Lab Results  Component Value Date   HGBA1C 6.3 (H) 06/25/2019 .   Lab Results  Component Value Date   CREATININE 0.92 06/25/2019   CREATININE 1.33 (H) 02/24/2019   CREATININE 1.03 06/03/2018 .   . Marland Kitcheno results found for: EGFR . Current antihyperglycemic regimen: None . Denies hypoglycemic symptoms, including dizziness, lightheadedness, shaking, sweating . Denies hyperglycemic symptoms, including polyuria, polydipsia, polyphagia, nocturia, blurred vision, neuropathy . Current exercise: significantly limited by mobility, uses cane . Current blood glucose readings: doesn't check . Cardiovascular risk reduction: o Current hypertensive regimen: quinapril 2032maily o Current hyperlipidemia regimen: Crestor 18m43mily o Current antiplatelet regimen: anticoagulant Eliquis 5mg 27mly  Pharmacist Clinical Goal(s):  . OveMarland Kitchen the next 90 days, patient will work with PharmD and primary care provider to address preventing pre-diabetes from becoming diabetes type 2  Interventions: . Comprehensive medication review performed, medication list updated in electronic medical record . Inter-disciplinary care team collaboration (see  longitudinal plan of care) . Counseled that although diabetes is on the diagnosis list, there is no indication that measurements warranted this diagnosis at any time in the past . Start Ozempic using this diagnosis, to promote weight loss  Patient Self Care Activities:  . Patient will make minor dietary changes and increase physical activity over the next 90 days . Patient will contact provider with any episodes of hypoglycemia . Patient will report any questions or concerns to provider  Initial goal documentation     . Prevent falls     Recommend to remove any items from the home that may cause slips or trips.      Depression Screen PHQ 2/9 Scores 05/31/2020 03/20/2020 05/24/2019 05/24/2019 05/18/2018 09/23/2017 05/15/2017  PHQ - 2 Score 0 0 0 0 0 0 0    Fall Risk Fall Risk  05/31/2020 03/20/2020 05/24/2019 04/19/2019 05/18/2018  Falls in the past year? 1 1 0 0 0  Comment - - - Emmi Telephone Survey: data to providers prior to load -  Number falls in past yr: 0 0 0 - 0  Injury with Fall? 0 1 0 - 0  Follow up Falls prevention discussed - - - -    FALL RISK PREVENTION PERTAINING TO THE HOME:  Any stairs in or around the home? Yes  If so, are there any without handrails? No  Home free of loose throw rugs in walkways, pet beds, electrical cords, etc? Yes  Adequate lighting in your home to reduce risk of falls? Yes   ASSISTIVE DEVICES UTILIZED TO PREVENT FALLS:  Life alert? No  Use of a cane, walker or w/c? Yes  Grab bars in the bathroom? No  Shower chair or bench in shower? No  Elevated toilet seat or a handicapped toilet? No    Cognitive Function: Normal cognitive status assessed by observation by this Nurse Health Advisor. No abnormalities found.      6CIT Screen 05/24/2019 05/18/2018 05/15/2017  What Year? 0 points 0 points 0 points  What month? 0 points - 0 points  What time? 0 points 0 points 0 points  Count back from 20 0 points 0 points 0 points  Months in reverse  0 points 0 points 0 points  Repeat phrase 0 points 0 points 0 points  Total Score 0 - 0    Immunizations Immunization History  Administered Date(s) Administered  . Fluad Quad(high Dose 65+) 02/24/2019, 02/14/2020  . Influenza Split 02/20/2011, 02/27/2012  . Influenza, High Dose Seasonal PF 02/22/2014, 02/18/2015, 02/29/2016, 02/26/2017, 04/22/2018  . Influenza,inj,Quad PF,6+ Mos 02/13/2013  . Influenza-Unspecified 02/24/2013  . PFIZER SARS-COV-2 Vaccination 06/17/2019, 07/08/2019, 04/26/2020  . Pneumococcal Conjugate-13 04/14/2014  . Pneumococcal Polysaccharide-23 03/25/1997, 04/05/2003  . Td 08/29/2003  . Tdap 02/20/2011, 02/14/2020  . Zoster 05/12/2007  . Zoster Recombinat (Shingrix) 02/26/2017, 06/25/2017    TDAP status: Up to date  Flu Vaccine status: Up to date  Pneumococcal vaccine status: Up to date  Covid-19 vaccine status: Completed vaccines  Qualifies for Shingles Vaccine? Yes   Zostavax completed Yes   Shingrix Completed?: Yes  Screening Tests Health Maintenance  Topic Date Due  . FOOT EXAM  Never done  . HEMOGLOBIN A1C  04/21/2020  . OPHTHALMOLOGY EXAM  04/26/2020  . COVID-19 Vaccine (4 - Booster for Pfizer series) 10/25/2020  . TETANUS/TDAP  02/13/2030  . INFLUENZA VACCINE  Completed  . PNA vac Low Risk Adult  Completed    Health Maintenance  Health Maintenance Due  Topic Date Due  . FOOT EXAM  Never done  . HEMOGLOBIN A1C  04/21/2020  . OPHTHALMOLOGY EXAM  04/26/2020    Colorectal cancer screening: No longer required.   Lung Cancer Screening: (Low Dose CT Chest recommended if Age 41-80 years, 30 pack-year currently smoking OR have quit w/in 15years.) does not qualify.   Additional Screening:  Vision Screening: Recommended annual ophthalmology exams for early detection of glaucoma and other disorders of the eye. Is the patient up to  date with their annual eye exam?  Yes  Who is the provider or what is the name of the office in which the  patient attends annual eye exams? Dr Dingeldein @ [place] If pt is not established with a provider, would they like to be referred to a provider to establish care? No .   Dental Screening: Recommended annual dental exams for proper oral hygiene  Community Resource Referral / Chronic Care Management: CRR required this visit?  No   CCM required this visit?  No      Plan:     I have personally reviewed and noted the following in the patient's chart:   . Medical and social history . Use of alcohol, tobacco or illicit drugs  . Current medications and supplements . Functional ability and status . Nutritional status . Physical activity . Advanced directives . List of other physicians . Hospitalizations, surgeries, and ER visits in previous 12 months . Vitals . Screenings to include cognitive, depression, and falls . Referrals and appointments  In addition, I have reviewed and discussed with patient certain preventive protocols, quality metrics, and best practice recommendations. A written personalized care plan for preventive services as well as general preventive health recommendations were provided to patient.     Jhoan Schmieder [employer], [place]   07/26/5943   Nurse Notes: Pt needs a diabetic foot exam and a Hgb A1c check at next in office apt. Pt has an eye exam scheduled this year.

## 2020-06-05 ENCOUNTER — Other Ambulatory Visit: Payer: Self-pay

## 2020-06-05 NOTE — Telephone Encounter (Signed)
[facility] faxed refill request for the following medications:  rosuvastatin (CRESTOR) 20 MG tablet - Requesting 90 day supply  Please advise. Thanks, [employer]

## 2020-06-07 MED ORDER — ROSUVASTATIN CALCIUM 20 MG PO TABS
ORAL_TABLET | ORAL | 4 refills | Status: DC
Start: 2020-06-07 — End: 2021-09-12

## 2020-06-08 ENCOUNTER — Telehealth: Payer: Self-pay

## 2020-06-08 NOTE — Chronic Care Management (AMB) (Signed)
06/08/2020  Called patient to remind him of an appointment with Daron Offer ,CPP on 06/09/2020 @ 1:30 PM.  Patient is aware to have all Medications and supplements available at time of appointment.  Daron Offer CPP Notified  Judithann Sheen, [facility] Clinical Pharmacist Assistant [phone]

## 2020-06-09 ENCOUNTER — Ambulatory Visit: Payer: Self-pay

## 2020-06-09 DIAGNOSIS — E785 Hyperlipidemia, unspecified: Secondary | ICD-10-CM

## 2020-06-09 DIAGNOSIS — I10 Essential (primary) hypertension: Secondary | ICD-10-CM

## 2020-06-09 DIAGNOSIS — S51812A Laceration without foreign body of left forearm, initial encounter: Secondary | ICD-10-CM | POA: Diagnosis not present

## 2020-06-09 NOTE — Chronic Care Management (AMB) (Signed)
Chronic Care Management Pharmacy  Name: BRAD LIEURANCE  MRN: [id number] DOB: 08-28-1932  Chief Complaint/ HPI  Gerda Diss Rashid,  87 y.o. , male presents for their Follow-Up CCM visit with the clinical pharmacist via telephone.  PCP : Jerrol Banana., MD  Their chronic conditions include: HTN, HLD  Office Visits: 05/31/20: Patient presented to [facility], LPN for AWV. 73/71/06: Patient presented to [facility] Malling, PA-C for suspected UTI. Pantoprazole stopped (pt not taking)   Consult Visit: 05/02/20: [place], OA   Medications: Outpatient Encounter Medications as of 06/09/2020  Medication Sig  . acetaminophen (TYLENOL) 500 MG tablet Take 1,000 mg by mouth. Twice daily as needed  . apixaban (ELIQUIS) 5 MG TABS tablet Take 1 tablet (5 mg total) by mouth 2 (two) times daily.  . diclofenac Sodium (VOLTAREN) 1 % GEL Apply 2 g topically 4 (four) times daily as needed.  . diphenhydramine-acetaminophen (TYLENOL PM) 25-500 MG TABS Take 2 tablets by mouth at bedtime as needed.  . doxazosin (CARDURA) 8 MG tablet Take 1 tablet (8 mg total) by mouth daily.  . fluconazole (DIFLUCAN) 200 MG tablet Take 1 tablet (200 mg total) by mouth 2 (two) times a week. Monday and Thursday  . furosemide (LASIX) 20 MG tablet Take 2 tablets by mouth daily (Patient taking differently: 20 mg.)  . Iodoquinol-HC-Aloe Polysacch (ALCORTIN A) 1-2-1 % GEL as needed.  [name] levothyroxine (SYNTHROID) 75 MCG tablet Take 1 tablet by mouth daily in the morning  . quinapril (ACCUPRIL) 20 MG tablet Take 1 tablet (20 mg total) by mouth daily.  . rosuvastatin (CRESTOR) 20 MG tablet Patient takes 81m  . topiramate (TOPAMAX) 50 MG tablet Take 1 tablet by mouth twice daily  . [DISCONTINUED] ciprofloxacin (CIPRO) 500 MG tablet Take 1 tablet (500 mg total) by mouth 2 (two) times daily.   No facility-administered encounter medications on file as of 06/09/2020.   Current Diagnosis/Assessment:  SDOH Interventions    Flowsheet Row Most Recent Value  SDOH Interventions   Financial Strain Interventions Intervention Not Indicated  Transportation Interventions Intervention Not Indicated     Goals Addressed            This Visit's Progress   . Hyperlipidemia - goal LDL < 70   On track    CTaos(see longitudinal plan of care for additional care plan information)  Current Barriers:  . Controlled hyperlipidemia, complicated by hypertension, hyperglycemia . Current antihyperlipidemic regimen: Crestor 230mdaily . Previous antihyperlipidemic medications tried NA . Most recent lipid panel:     Component Value Date/Time   CHOL 104 06/25/2019 0908   TRIG 45 06/25/2019 0908   HDL 42 06/25/2019 0908   CHOLHDL 2.5 06/25/2019 0908   LDLCALC 51 06/25/2019 0908 .   . Marland KitchenSCVD risk enhancing conditions: age >6>86DM, HTN . 10-year ASCVD risk score: NA  Pharmacist Clinical Goal(s):  . Marland Kitchenver the next 90 days, patient will work with PharmD and providers towards continuing optimized antihyperlipidemic therapy  Interventions: . Comprehensive medication review performed; medication list updated in electronic medical record.  . Bertram Savinare team collaboration (see longitudinal plan of care)  Patient Self Care Activities:  . Patient will focus on medication adherence by ensuring medication list reflects how he is actually taking the rosuvastatin  Initial goal documentation     . Hypertension - goal BP < 140/90   On track    CANorwoodsee longitudinal plan of care for additional care plan information)  Current Barriers:  . Controlled hypertension, complicated by hyperlipidemia, obesity . Current antihypertensive regimen: quinapril 51m daily, doxazosin 861mdaily . Previous antihypertensives tried: NA . Last practice recorded BP readings:  BP Readings from Last 3 Encounters:  08/04/19 133/74  06/22/19 (!) 142/81  05/13/19 122/74 .   . Marland Kitchenurrent home BP readings: doesn't  check . Most recent eGFR/CrCl: No results found for: EGFR  No components found for: CRCL  Pharmacist Clinical Goal(s):  . Marland Kitchenver the next 90 days, patient will work with PharmD and providers to continue optimized antihypertensive regimen  Interventions: . Inter-disciplinary care team collaboration (see longitudinal plan of care) . Comprehensive medication review performed; medication list updated in the electronic medical record.   Patient Self Care Activities:  . Patient will check BP and pulse, document, and provide at future appointments . Patient will focus on medication adherence by checking BP at home for the next 30 days  Initial goal documentation     . Pre-diabetes - goal A1c < 6.5%   On track    CAPort Wentworthsee longitudinal plan of care for additional care plan information)  Current Barriers:  . Pre-diabetes: type 2; complicated by chronic medical conditions including hypertension, hyperlipidemia Lab Results  Component Value Date   HGBA1C 6.3 (H) 06/25/2019 .   Lab Results  Component Value Date   CREATININE 0.92 06/25/2019   CREATININE 1.33 (H) 02/24/2019   CREATININE 1.03 06/03/2018 .   . Marland Kitcheno results found for: EGFR . Current antihyperglycemic regimen: None . Denies hypoglycemic symptoms, including dizziness, lightheadedness, shaking, sweating . Denies hyperglycemic symptoms, including polyuria, polydipsia, polyphagia, nocturia, blurred vision, neuropathy . Current exercise: significantly limited by mobility, uses cane . Current blood glucose readings: doesn't check . Cardiovascular risk reduction: o Current hypertensive regimen: quinapril 2064maily o Current hyperlipidemia regimen: Crestor 52m3mily o Current antiplatelet regimen: anticoagulant Eliquis 5mg 72mly  Pharmacist Clinical Goal(s):  . OveMarland Kitchen the next 90 days, patient will work with PharmD and primary care provider to address preventing pre-diabetes from becoming diabetes type  2  Interventions: . Comprehensive medication review performed, medication list updated in electronic medical record . Inter-disciplinary care team collaboration (see longitudinal plan of care) . Counseled that although diabetes is on the diagnosis list, there is no indication that measurements warranted this diagnosis at any time in the past . Start Ozempic using this diagnosis, to promote weight loss  Patient Self Care Activities:  . Patient will make minor dietary changes and increase physical activity over the next 90 days . Patient will contact provider with any episodes of hypoglycemia . Patient will report any questions or concerns to provider   Initial goal documentation       Hypertension   BP goal is:  <140/90  Office blood pressures are  BP Readings from Last 3 Encounters:  06/14/20 114/72  04/12/20 115/72  03/20/20 127/71   Patient checks BP at home infrequently Patient home BP readings are ranging: NA  Patient has failed these meds in the past: NA Patient is currently controlled on the following medications:  . Quinapril 20mg 84my . Doxazosin 8mg da67m  We discussed: Denies dizziness, vision changes, or chest pain.   Plan  Continue current medications   Hyperlipidemia   LDL goal < 70  Lipid Panel     Component Value Date/Time   CHOL 104 06/25/2019 0908   TRIG 45 06/25/2019 0908   HDL 42 06/25/2019 0908   LDLCALC 51 06/25/2019 0908  Hepatic Function Latest Ref Rng & Units 11/11/2019 06/25/2019 02/24/2019  Total Protein 6.0 - 8.5 g/dL 6.0 5.6(L) 5.8(L)  Albumin 3.6 - 4.6 g/dL 3.9 3.8 3.7  AST 0 - 40 IU/L 8 9 14(L)  ALT 0 - 44 IU/L 5 6 12   Alk Phosphatase 48 - 121 IU/L 66 65 47  Total Bilirubin 0.0 - 1.2 mg/dL 0.4 0.3 0.8     The ASCVD Risk score Mikey Bussing DC Jr., et al., 2013) failed to calculate for the following reasons:   The 2013 ASCVD risk score is only valid for ages 30 to 65   Patient has failed these meds in past: NA Patient is currently  controlled on the following medications:  . Crestor 66m daily  We discussed:  Tolerating well, denies myalgias.   Plan  Continue current medications  Osteoarthritis    Patient has failed these meds in past: NA Patient is currently controlled on the following medications:  . APAP 500 2 tablets twice daily  . Tylenol PM 2 tablets QHS  . Voltaren 1% gel (rarely uses)   We discussed:  Patient needs cardio clearance to get knee replacement. Counseled patient not to exceed 3000 mg acetaminophen from ALL sources daily.   Plan  Continue current medications   Medication Management   [facility] Uses pill box? Yes Pt endorses 100% compliance  We discussed:   Plan  Continue current medication management strategy  Follow up: 6 month phone visit  [phone]
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
## 2020-06-14 ENCOUNTER — Encounter: Payer: Self-pay | Admitting: General Practice

## 2020-06-14 ENCOUNTER — Ambulatory Visit (INDEPENDENT_AMBULATORY_CARE_PROVIDER_SITE_OTHER): Payer: PPO | Admitting: General Practice

## 2020-06-14 ENCOUNTER — Other Ambulatory Visit: Payer: Self-pay

## 2020-06-14 VITALS — BP 114/72 | HR 83 | Wt 260.0 lb

## 2020-06-14 DIAGNOSIS — I712 Thoracic aortic aneurysm, without rupture, unspecified: Secondary | ICD-10-CM

## 2020-06-14 DIAGNOSIS — I779 Disorder of arteries and arterioles, unspecified: Secondary | ICD-10-CM | POA: Diagnosis not present

## 2020-06-14 DIAGNOSIS — I10 Essential (primary) hypertension: Secondary | ICD-10-CM

## 2020-06-14 DIAGNOSIS — I4891 Unspecified atrial fibrillation: Secondary | ICD-10-CM

## 2020-06-14 DIAGNOSIS — Z01810 Encounter for preprocedural cardiovascular examination: Secondary | ICD-10-CM | POA: Diagnosis not present

## 2020-06-14 DIAGNOSIS — I2510 Atherosclerotic heart disease of native coronary artery without angina pectoris: Secondary | ICD-10-CM

## 2020-06-14 NOTE — Patient Instructions (Signed)
Medication Instructions:  The current medical regimen is effective;  continue present plan and medications as directed. Please refer to the Current Medication list given to you today.  *If you need a refill on your cardiac medications before your next appointment, please call your pharmacy*  Lab Work:   Testing/Procedures:  NONE    NONE  Special Instructions CLEARED FOR SURGERY  PLEASE READ AND FOLLOW SALTY 6-ATTACHED-1,800mg  daily  Follow-Up: Your next appointment:  6 month(s) In Person with Peter Martinique, MD OR IF UNAVAILABLE [place], FNP-C   Please call our office 2 months in advance to schedule this appointment   At [facility], you and your health needs are our priority.  As part of our continuing mission to provide you with exceptional heart care, we have created designated Provider Care Teams.  These Care Teams include your primary Cardiologist (physician) and Advanced Practice Providers (APPs -  Physician Assistants and Nurse Practitioners) who all work together to provide you with the care you need, when you need it.            6 SALTY THINGS TO AVOID     1,800MG  DAILY

## 2020-06-15 ENCOUNTER — Other Ambulatory Visit: Payer: Self-pay

## 2020-06-15 NOTE — Patient Instructions (Signed)
Visit Information It was great speaking with you today!  Please let me know if you have any questions about our visit. Goals Addressed            This Visit's Progress   . Hyperlipidemia - goal LDL < 70   On track    [place] (see longitudinal plan of care for additional care plan information)  Current Barriers:  . Controlled hyperlipidemia, complicated by hypertension, hyperglycemia . Current antihyperlipidemic regimen: Crestor 30m daily . Previous antihyperlipidemic medications tried NA . Most recent lipid panel:     Component Value Date/Time   CHOL 104 06/25/2019 0908   TRIG 45 06/25/2019 0908   HDL 42 06/25/2019 0908   CHOLHDL 2.5 06/25/2019 0908   LDLCALC 51 06/25/2019 0908 .   .Marland KitchenASCVD risk enhancing conditions: age >>12 DM, HTN . 10-year ASCVD risk score: NA  Pharmacist Clinical Goal(s):  .Marland KitchenOver the next 90 days, patient will work with PharmD and providers towards continuing optimized antihyperlipidemic therapy  Interventions: . Comprehensive medication review performed; medication list updated in electronic medical record.  .[facility] team collaboration (see longitudinal plan of care)  Patient Self Care Activities:  . Patient will focus on medication adherence by ensuring medication list reflects how he is actually taking the rosuvastatin  Initial goal documentation     . Hypertension - goal BP < 140/90   [place]    [place] plan of care for additional care plan information)  Current Barriers:  . Controlled hypertension, complicated by hyperlipidemia, obesity . Current antihypertensive regimen: quinapril 240mdaily, doxazosin 38m44maily . Previous antihypertensives tried: NA . Last practice recorded BP readings:  BP Readings from Last 3 Encounters:  08/04/19 133/74  06/22/19 (!) 142/81  05/13/19 122/74 .   . CMarland Kitchenrrent home BP readings: doesn't check . Most recent eGFR/CrCl: No results found for: EGFR  No  components found for: CRCL  Pharmacist Clinical Goal(s):  . OMarland Kitchener the next 90 days, patient will work with PharmD and providers to continue optimized antihypertensive regimen  Interventions: . Inter-disciplinary care team collaboration (see longitudinal plan of care) . Comprehensive medication review performed; medication list updated in the electronic medical record.   Patient Self Care Activities:  . Patient will check BP and pulse, document, and provide at future appointments . Patient will focus on medication adherence by checking BP at home for the next 30 days  Initial goal documentation     . Pre-diabetes - goal A1c < 6.5%   On track    [place] care for additional care plan information)  Current Barriers:  . Pre-diabetes: type 2; complicated by chronic medical conditions including hypertension, hyperlipidemia Lab Results  Component Value Date   HGBA1C 6.3 (H) 06/25/2019 .   Lab Results  Component Value Date   CREATININE 0.92 06/25/2019   CREATININE 1.33 (H) 02/24/2019   CREATININE 1.03 06/03/2018 .   . NMarland Kitchen results found for: EGFR . Current antihyperglycemic regimen: None . Denies hypoglycemic symptoms, including dizziness, lightheadedness, shaking, sweating . Denies hyperglycemic symptoms, including polyuria, polydipsia, polyphagia, nocturia, blurred vision, neuropathy . Current exercise: significantly limited by mobility, uses cane . Current blood glucose readings: doesn't check . Cardiovascular risk reduction: o Current hypertensive regimen: quinapril 27m15mily o Current hyperlipidemia regimen: Crestor 10mg31mly o Current antiplatelet regimen: anticoagulant Eliquis 5mg d70my  Pharmacist Clinical Goal(s):  . OverMarland Kitchenthe next 90 days, patient will work with PharmD  and primary care provider to address preventing pre-diabetes from becoming diabetes type 2  Interventions: . Comprehensive medication review performed, medication list  updated in electronic medical record . Inter-disciplinary care team collaboration (see longitudinal plan of care) . Counseled that although diabetes is on the diagnosis list, there is no indication that measurements warranted this diagnosis at any time in the past . Start Ozempic using this diagnosis, to promote weight loss  Patient Self Care Activities:  . Patient will make minor dietary changes and increase physical activity over the next 90 days . Patient will contact provider with any episodes of hypoglycemia . Patient will report any questions or concerns to provider   Initial goal documentation       The patient verbalized understanding of instructions, educational materials, and care plan provided today and declined offer to receive copy of patient instructions, educational materials, and care plan.   Telephone follow up appointment with pharmacy team member scheduled for: 12/08/20 at 1:00 PM  [place] [phone]

## 2020-06-21 ENCOUNTER — Telehealth: Payer: Self-pay | Admitting: Cardiology

## 2020-06-21 DIAGNOSIS — B351 Tinea unguium: Secondary | ICD-10-CM | POA: Diagnosis not present

## 2020-06-21 DIAGNOSIS — L851 Acquired keratosis [keratoderma] palmaris et plantaris: Secondary | ICD-10-CM | POA: Diagnosis not present

## 2020-06-21 DIAGNOSIS — E1142 Type 2 diabetes mellitus with diabetic polyneuropathy: Secondary | ICD-10-CM | POA: Diagnosis not present

## 2020-06-21 NOTE — Telephone Encounter (Signed)
New message:     Patient calling to have his medical clearance sent to Dr. Duayne Cal number is [phone] and stated that Evelena Peat had cleared him. Please advise

## 2020-06-21 NOTE — Telephone Encounter (Signed)
Office note with cardiac clearance was re faxed to Dr. Clydell Hakim office.

## 2020-06-22 ENCOUNTER — Ambulatory Visit (INDEPENDENT_AMBULATORY_CARE_PROVIDER_SITE_OTHER): Payer: PPO | Admitting: Family Medicine

## 2020-06-22 ENCOUNTER — Encounter: Payer: Self-pay | Admitting: Family Medicine

## 2020-06-22 ENCOUNTER — Other Ambulatory Visit: Payer: Self-pay

## 2020-06-22 VITALS — BP 138/94 | HR 74 | Temp 98.7°F | Resp 18 | Ht 74.0 in | Wt 264.0 lb

## 2020-06-22 DIAGNOSIS — Z6833 Body mass index (BMI) 33.0-33.9, adult: Secondary | ICD-10-CM

## 2020-06-22 DIAGNOSIS — E6609 Other obesity due to excess calories: Secondary | ICD-10-CM | POA: Diagnosis not present

## 2020-06-22 DIAGNOSIS — E785 Hyperlipidemia, unspecified: Secondary | ICD-10-CM

## 2020-06-22 DIAGNOSIS — I10 Essential (primary) hypertension: Secondary | ICD-10-CM

## 2020-06-22 DIAGNOSIS — I25118 Atherosclerotic heart disease of native coronary artery with other forms of angina pectoris: Secondary | ICD-10-CM

## 2020-06-22 DIAGNOSIS — S51812A Laceration without foreign body of left forearm, initial encounter: Secondary | ICD-10-CM | POA: Diagnosis not present

## 2020-06-22 DIAGNOSIS — I4891 Unspecified atrial fibrillation: Secondary | ICD-10-CM | POA: Diagnosis not present

## 2020-06-22 DIAGNOSIS — G629 Polyneuropathy, unspecified: Secondary | ICD-10-CM | POA: Diagnosis not present

## 2020-06-22 DIAGNOSIS — R7303 Prediabetes: Secondary | ICD-10-CM

## 2020-06-22 NOTE — Patient Instructions (Signed)
Use Petrolatum Gel on wound.

## 2020-06-22 NOTE — Progress Notes (Signed)
I,[name],acting as a scribe for Wilhemena Durie, MD.,have documented all relevant documentation on the behalf of Wilhemena Durie, MD,as directed by  Wilhemena Durie, MD while in the presence of Wilhemena Durie, MD.   Established patient visit   Patient: Jared Rice   DOB: Mar 19, 1933   87 y.o. Male  MRN: [id number] Visit Date: 06/22/2020  Today's healthcare provider: Wilhemena Durie, MD   Chief Complaint  Patient presents with  . Follow-up  . Diabetes   Subjective    HPI  Patient comes in today for follow-up.  His only issue today is 1 of continued skin tears on his arms and legs.  None are infected but they continue to happen with minor trauma.  He has significant osteoarthritis of the right knee.  He has had COVID booster recently. Diabetes Mellitus Type II, follow-up  Lab Results  Component Value Date   HGBA1C 6.2 (A) 10/20/2019   HGBA1C 6.3 (H) 06/25/2019   HGBA1C 6.0 (H) 08/21/2016   Last seen for diabetes 4 months ago.  Management since then includes continuing the same treatment. He reports good compliance with treatment. He is not having side effects. none  Home blood sugar records: fasting range: not checking  Episodes of hypoglycemia? No none   Current insulin regiment: n/a Most Recent Eye Exam: Due  --------------------------------------------------------------------  Edema of both legs From 02/21/2020-Clinically much better.  Class 2 severe obesity due to excess calories with serious comorbidity and body mass index (BMI) of 36.0 to 36.9 in adult [facility]) From 02/21/2020-With diabetes/ASCVD and hypertension. Consider cutting Accupril dose in half on next visit if blood pressure still remains low.      Medications: Outpatient Medications Prior to Visit  Medication Sig  . acetaminophen (TYLENOL) 500 MG tablet Take 1,000 mg by mouth. Twice daily as needed  . apixaban (ELIQUIS) 5 MG TABS tablet Take 1 tablet (5 mg total) by mouth 2  (two) times daily.  . diclofenac Sodium (VOLTAREN) 1 % GEL Apply 2 g topically 4 (four) times daily as needed.  . diphenhydramine-acetaminophen (TYLENOL PM) 25-500 MG TABS Take 2 tablets by mouth at bedtime as needed.  . doxazosin (CARDURA) 8 MG tablet Take 1 tablet (8 mg total) by mouth daily.  . furosemide (LASIX) 20 MG tablet Take 2 tablets by mouth daily (Patient taking differently: 20 mg.)  . Iodoquinol-HC-Aloe Polysacch (ALCORTIN A) 1-2-1 % GEL as needed.  [name] levothyroxine (SYNTHROID) 75 MCG tablet Take 1 tablet by mouth daily in the morning  . quinapril (ACCUPRIL) 20 MG tablet Take 1 tablet (20 mg total) by mouth daily.  . rosuvastatin (CRESTOR) 20 MG tablet Patient takes 10mg   . topiramate (TOPAMAX) 50 MG tablet Take 1 tablet by mouth twice daily  . fluconazole (DIFLUCAN) 200 MG tablet Take 1 tablet (200 mg total) by mouth 2 (two) times a week. Monday and Thursday (Patient not taking: Reported on 06/22/2020)   No facility-administered medications prior to visit.    Review of Systems  Constitutional: Negative for appetite change, chills and fever.  Respiratory: Negative for chest tightness, shortness of breath and wheezing.   Cardiovascular: Negative for chest pain and palpitations.  Gastrointestinal: Negative for abdominal pain, nausea and vomiting.    Last hemoglobin A1c Lab Results  Component Value Date   HGBA1C 6.2 (A) 10/20/2019       Objective    BP (!) 138/94 (BP Location: Right Arm, Patient Position: Sitting, Cuff Size: Large)  Pulse 74   Temp 98.7 F (37.1 C) (Oral)   Resp 18   Ht 6\' 2"  (1.88 m)   Wt 264 lb (119.7 kg)   SpO2 95%   BMI 33.90 kg/m  BP Readings from Last 3 Encounters:  06/22/20 (!) 138/94  06/14/20 114/72  04/12/20 115/72   Wt Readings from Last 3 Encounters:  06/22/20 264 lb (119.7 kg)  06/14/20 260 lb (117.9 kg)  04/12/20 265 lb (120.2 kg)       Physical Exam Vitals reviewed.  Constitutional:      Appearance: Normal appearance.   HENT:     Head: Normocephalic and atraumatic.     Right Ear: External ear normal.     Left Ear: External ear normal.  Eyes:     General: No scleral icterus.    Conjunctiva/sclera: Conjunctivae normal.  Cardiovascular:     Rate and Rhythm: Normal rate and regular rhythm.     Pulses: Normal pulses.     Heart sounds: Normal heart sounds.  Pulmonary:     Effort: Pulmonary effort is normal.     Breath sounds: Normal breath sounds.  Musculoskeletal:     Comments: He has 1+ lower extremity edema  Skin:    General: Skin is warm and dry.     Comments: He has a couple of skin tears on his arms and legs that are not infected.  They are not lacerations but skin tears.  Neurological:     General: No focal deficit present.     Mental Status: He is alert and oriented to person, place, and time.  Psychiatric:        Mood and Affect: Mood normal.        Behavior: Behavior normal.        Thought Content: Thought content normal.        Judgment: Judgment normal.       No results found for any visits on 06/22/20.  Assessment & Plan     1. Essential hypertension Under fair control on quinapril 20 - Hemoglobin A1c - Lipid panel - TSH - CBC w/Diff/Platelet - Comprehensive Metabolic Panel (CMET)  2. Hyperlipidemia, unspecified hyperlipidemia type On rosuvastatin 20, goal LDL less than 70 - Hemoglobin A1c - Lipid panel - TSH - CBC w/Diff/Platelet - Comprehensive Metabolic Panel (CMET)  3. Prediabetes Last A1c was 6.2.  Obtain on next lab draw - Hemoglobin A1c - Lipid panel - TSH - CBC w/Diff/Platelet - Comprehensive Metabolic Panel (CMET)  4. Atrial fibrillation, unspecified type ([place]) On Xarelto.  Follow kidney function and see if dose adjustment is needed. - Hemoglobin A1c - Lipid panel - TSH - CBC w/Diff/Platelet - Comprehensive Metabolic Panel (CMET)  5. Skin tear of forearm without complication, left, initial encounter Try petrolatum gel  6. Atherosclerosis of  native coronary artery of native heart with stable angina pectoris ([place]) All risk factors treated  7. Neuropathy   8. Class 1 obesity due to excess calories with serious comorbidity and body mass index (BMI) of 33.0 to 33.9 in adult Patient is lost more than 20 pounds and he is continue to work on this.   No follow-ups on file.      I, Wilhemena Durie, MD, have reviewed all documentation for this visit. The documentation on 06/28/20 for the exam, diagnosis, procedures, and orders are all accurate and complete.    [place]aquoia Miers Cranford Mon, MD  [facility] [phone] (phone) [phone] (fax)  [place]

## 2020-06-26 DIAGNOSIS — S51812D Laceration without foreign body of left forearm, subsequent encounter: Secondary | ICD-10-CM | POA: Diagnosis not present

## 2020-06-29 DIAGNOSIS — S51812D Laceration without foreign body of left forearm, subsequent encounter: Secondary | ICD-10-CM | POA: Diagnosis not present

## 2020-07-05 DIAGNOSIS — R7303 Prediabetes: Secondary | ICD-10-CM | POA: Diagnosis not present

## 2020-07-05 DIAGNOSIS — I10 Essential (primary) hypertension: Secondary | ICD-10-CM | POA: Diagnosis not present

## 2020-07-05 DIAGNOSIS — I4891 Unspecified atrial fibrillation: Secondary | ICD-10-CM | POA: Diagnosis not present

## 2020-07-05 DIAGNOSIS — E785 Hyperlipidemia, unspecified: Secondary | ICD-10-CM | POA: Diagnosis not present

## 2020-07-06 LAB — COMPREHENSIVE METABOLIC PANEL
ALT: 6 IU/L (ref 0–44)
AST: 8 IU/L (ref 0–40)
Albumin/Globulin Ratio: 2.2 (ref 1.2–2.2)
Albumin: 4 g/dL (ref 3.6–4.6)
Alkaline Phosphatase: 67 IU/L (ref 44–121)
BUN/Creatinine Ratio: 16 (ref 10–24)
BUN: 17 mg/dL (ref 8–27)
Bilirubin Total: 0.4 mg/dL (ref 0.0–1.2)
CO2: 23 mmol/L (ref 20–29)
Calcium: 8.8 mg/dL (ref 8.6–10.2)
Chloride: 107 mmol/L — ABNORMAL HIGH (ref 96–106)
Creatinine, Ser: 1.05 mg/dL (ref 0.76–1.27)
GFR calc Af Amer: 74 mL/min/{1.73_m2} (ref >59)
GFR calc non Af Amer: 64 mL/min/{1.73_m2} (ref >59)
Globulin, Total: 1.8 g/dL (ref 1.5–4.5)
Glucose: 102 mg/dL — ABNORMAL HIGH (ref 65–99)
Potassium: 4.3 mmol/L (ref 3.5–5.2)
Sodium: 144 mmol/L (ref 134–144)
Total Protein: 5.8 g/dL — ABNORMAL LOW (ref 6.0–8.5)

## 2020-07-06 LAB — LIPID PANEL
Chol/HDL Ratio: 2.6 ratio (ref 0.0–5.0)
Cholesterol, Total: 100 mg/dL (ref 100–199)
HDL: 39 mg/dL — ABNORMAL LOW (ref >39)
LDL Chol Calc (NIH): 51 mg/dL (ref 0–99)
Triglycerides: 36 mg/dL (ref 0–149)
VLDL Cholesterol Cal: 10 mg/dL (ref 5–40)

## 2020-07-06 LAB — CBC WITH DIFFERENTIAL/PLATELET
Basophils Absolute: 0 10*3/uL (ref 0.0–0.2)
Basos: 1 %
EOS (ABSOLUTE): 0.2 10*3/uL (ref 0.0–0.4)
Eos: 4 %
Hematocrit: 36.2 % — ABNORMAL LOW (ref 37.5–51.0)
Hemoglobin: 11.7 g/dL — ABNORMAL LOW (ref 13.0–17.7)
Immature Grans (Abs): 0 10*3/uL (ref 0.0–0.1)
Immature Granulocytes: 0 %
Lymphocytes Absolute: 1.2 10*3/uL (ref 0.7–3.1)
Lymphs: 22 %
MCH: 31.2 pg (ref 26.6–33.0)
MCHC: 32.3 g/dL (ref 31.5–35.7)
MCV: 97 fL (ref 79–97)
Monocytes Absolute: 0.5 10*3/uL (ref 0.1–0.9)
Monocytes: 10 %
Neutrophils Absolute: 3.4 10*3/uL (ref 1.4–7.0)
Neutrophils: 63 %
Platelets: 164 10*3/uL (ref 150–450)
RBC: 3.75 x10E6/uL — ABNORMAL LOW (ref 4.14–5.80)
RDW: 12.9 % (ref 11.6–15.4)
WBC: 5.4 10*3/uL (ref 3.4–10.8)

## 2020-07-06 LAB — HEMOGLOBIN A1C
Est. average glucose Bld gHb Est-mCnc: 123 mg/dL
Hgb A1c MFr Bld: 5.9 % — ABNORMAL HIGH (ref 4.8–5.6)

## 2020-07-06 LAB — TSH: TSH: 3.84 u[IU]/mL (ref 0.450–4.500)

## 2020-07-10 ENCOUNTER — Other Ambulatory Visit: Payer: Self-pay | Admitting: Family Medicine

## 2020-07-10 NOTE — Telephone Encounter (Signed)
Requested medication (s) are due for refill today: yes  Requested medication (s) are on the active medication list: yes  Last refill: 12/28/19  #60  5 refills  Future visit scheduled yes 09/21/20  Notes to clinic:  not delegated  Requested Prescriptions  Pending Prescriptions Disp Refills   topiramate (TOPAMAX) 50 MG tablet [Pharmacy Med Name: Topiramate 50 MG Oral Tablet] 180 tablet 0    Sig: Take 1 tablet by mouth twice daily      Not Delegated - Neurology: Anticonvulsants - topiramate & zonisamide Failed - 07/10/2020 11:24 AM      Failed - This refill cannot be delegated      Passed - Cr in normal range and within 360 days    Creatinine  Date Value Ref Range Status  10/13/2013 0.87 0.60 - 1.30 mg/dL Final   Creatinine, Ser  Date Value Ref Range Status  07/05/2020 1.05 0.76 - 1.27 mg/dL Final          Passed - CO2 in normal range and within 360 days    CO2  Date Value Ref Range Status  07/05/2020 23 20 - 29 mmol/L Final   Co2  Date Value Ref Range Status  10/13/2013 30 21 - 32 mmol/L Final          Passed - Valid encounter within last 12 months    Recent Outpatient Visits           2 weeks ago Essential hypertension   [facility] Jerrol Banana., MD   2 months ago Frequent urination   [place], [place]   3 months ago Urinary frequency   [place], PA-C   4 months ago Wound cellulitis- right lower leg anterior    [facility] Jerrol Banana., MD   4 months ago Wound cellulitis- right lower leg anterior    [facility] Trinna Post, [place]       Future Appointments             In 1 month Ralene Bathe, MD [place]   In 2 months Jerrol Banana., MD [facility], [place]

## 2020-07-13 NOTE — Telephone Encounter (Signed)
Pt called in requesting to have this sent in today. He states that the pharmacy will not refill because they state he has not refills on file. Pt states that they will be sending over another request today. Please advise.

## 2020-07-24 ENCOUNTER — Other Ambulatory Visit: Payer: Self-pay | Admitting: Family Medicine

## 2020-07-24 NOTE — Telephone Encounter (Signed)
Dose of furosemide is written for 40 mg and pt is splitting the tablet and he is taking 20 mg. Pt stated the dose is working for him. Chart stated 40 mg daily. Pt has 10 pills left. Please review.

## 2020-07-31 ENCOUNTER — Telehealth: Payer: Self-pay | Admitting: Family Medicine

## 2020-07-31 NOTE — Telephone Encounter (Signed)
Please review. Thanks!  

## 2020-07-31 NOTE — Telephone Encounter (Signed)
Patient is requesting : Amoxicillin 500 mg 4 tablets  Take 1 hour before dental appointment

## 2020-07-31 NOTE — Telephone Encounter (Signed)
Copied from [place] [id number]. Topic: Quick Communication - Rx Refill/Question >> Jul 31, 2020  1:21 PM Leward Quan A wrote: Medication: amoxicillin (AMOXIL) 500 MG capsule Need this for an appointment on 08/01/20 at 10.15 AM ..Marland Kitchenplease fill today  Has the patient contacted their pharmacy? Yes.   (Agent: If no, request that the patient contact the pharmacy for the refill.) (Agent: If yes, when and what did the pharmacy advise?)  Preferred Pharmacy (with phone number or street name): [facility], [place]  Phone:  [phone] Fax:  [phone]     Agent: Please be advised that RX refills may take up to 3 business days. We ask that you follow-up with your pharmacy.

## 2020-08-02 MED ORDER — AMOXICILLIN 500 MG PO CAPS: 500.0000 mg | ORAL_CAPSULE | Freq: Every day | ORAL | 0 refills | Status: AC

## 2020-08-02 NOTE — Telephone Encounter (Signed)
Medication sent.

## 2020-08-02 NOTE — Telephone Encounter (Signed)
#  4  Tablets, 4 refills.

## 2020-08-03 ENCOUNTER — Telehealth: Payer: Self-pay

## 2020-08-03 NOTE — Progress Notes (Signed)
Chronic Care Management Pharmacy Assistant   Name: Jared Rice  MRN: [id number] DOB: 18-Dec-1932  Reason for Encounter:Hypertension Disease State  Call.   Conditions to be addressed/monitored: HTN  Primary concerns for visit include: Hypertension   Recent office visits:  06/22/2020 PCP [name]  Recent consult visits:  06/14/2020 [facility] [name]  06/21/2020 [facility] visits:  None in previous 6 months  Medications: Outpatient Encounter Medications as of 08/03/2020  Medication Sig   topiramate (TOPAMAX) 50 MG tablet Take 1 tablet by mouth twice daily   acetaminophen (TYLENOL) 500 MG tablet Take 1,000 mg by mouth. Twice daily as needed   amoxicillin (AMOXIL) 500 MG capsule Take 1 capsule (500 mg total) by mouth daily for 4 days.   apixaban (ELIQUIS) 5 MG TABS tablet Take 1 tablet (5 mg total) by mouth 2 (two) times daily.   diclofenac Sodium (VOLTAREN) 1 % GEL Apply 2 g topically 4 (four) times daily as needed.   diphenhydramine-acetaminophen (TYLENOL PM) 25-500 MG TABS Take 2 tablets by mouth at bedtime as needed.   doxazosin (CARDURA) 8 MG tablet Take 1 tablet (8 mg total) by mouth daily.   fluconazole (DIFLUCAN) 200 MG tablet Take 1 tablet (200 mg total) by mouth 2 (two) times a week. Monday and Thursday (Patient not taking: Reported on 06/22/2020)   furosemide (LASIX) 20 MG tablet Take 2 tablets by mouth daily (Patient taking differently: 20 mg.)   furosemide (LASIX) 40 MG tablet Take 1 tablet by mouth once daily (dose change)   Iodoquinol-HC-Aloe Polysacch (ALCORTIN A) 1-2-1 % GEL as needed.   levothyroxine (SYNTHROID) 75 MCG tablet Take 1 tablet by mouth daily in the morning   quinapril (ACCUPRIL) 20 MG tablet Take 1 tablet (20 mg total) by mouth daily.   rosuvastatin (CRESTOR) 20 MG tablet Patient takes 10mg    No facility-administered encounter medications on file as of 08/03/2020.      Star Rating Drugs:  Quinapril,Rosuvastatin  Reviewed chart prior to disease state call. Spoke with patient regarding BP  Recent Office Vitals: BP Readings from Last 3 Encounters:  06/22/20 (!) 138/94  06/14/20 114/72  04/12/20 115/72   Pulse Readings from Last 3 Encounters:  06/22/20 74  06/14/20 83  04/12/20 87    Wt Readings from Last 3 Encounters:  06/22/20 264 lb (119.7 kg)  06/14/20 260 lb (117.9 kg)  04/12/20 265 lb (120.2 kg)     Kidney Function Lab Results  Component Value Date/Time   CREATININE 1.05 07/05/2020 10:31 AM   CREATININE 1.32 (H) 11/11/2019 11:50 AM   CREATININE 0.87 10/13/2013 10:55 AM   GFRNONAA 64 07/05/2020 10:31 AM   GFRNONAA >60 10/13/2013 10:55 AM   GFRAA 74 07/05/2020 10:31 AM   GFRAA >60 10/13/2013 10:55 AM    BMP Latest Ref Rng & Units 07/05/2020 11/11/2019 06/25/2019  Glucose 65 - 99 mg/dL 102(H) 94 100(H)  BUN 8 - 27 mg/dL 17 21 15   Creatinine 0.76 - 1.27 mg/dL 1.05 1.32(H) 0.92  BUN/Creat Ratio 10 - 24 16 16 16   Sodium 134 - 144 mmol/L 144 141 143  Potassium 3.5 - 5.2 mmol/L 4.3 4.4 4.3  Chloride 96 - 106 mmol/L 107(H) 102 108(H)  CO2 20 - 29 mmol/L 23 27 28   Calcium 8.6 - 10.2 mg/dL 8.8 8.6 8.7     Current antihypertensive regimen:   quinapril 20 mg  One tablet daily  doxazosin 8 mg One tablet  daily  How  often are you checking your Blood Pressure? Patient states he does not check his blood pressure at home.  Current home BP readings: None ID  What recent interventions/DTPs have been made by any provider to improve Blood Pressure control since last CPP Visit: None ID   Any recent hospitalizations or ED visits since last visit with CPP? No  What diet changes have been made to improve Blood Pressure Control?  o Patient states he eats a reasonable amount of fast food. o Patient states his wife cooks at home (vegatable soup).  What exercise is being done to improve your Blood Pressure Control?  o Patient reports he is going to have knee  replacement surgery on  September 06 2020.  o Patient states he is unable to exercise due to his right knee pain.  Adherence Review: Is the patient currently on ACE/ARB medication? Yes Does the patient have >5 day gap between last estimated fill dates? No   Bessie D [place] Pharmacist Assistant [phone]

## 2020-08-07 ENCOUNTER — Telehealth: Payer: Self-pay | Admitting: Cardiology

## 2020-08-07 ENCOUNTER — Other Ambulatory Visit: Payer: Self-pay

## 2020-08-07 MED ORDER — APIXABAN 5 MG PO TABS
5.0000 mg | ORAL_TABLET | Freq: Two times a day (BID) | ORAL | 3 refills | Status: DC
Start: 2020-08-07 — End: 2020-08-07

## 2020-08-07 MED ORDER — APIXABAN 5 MG PO TABS
5.0000 mg | ORAL_TABLET | Freq: Two times a day (BID) | ORAL | 3 refills | Status: DC
Start: 2020-08-07 — End: 2021-04-11

## 2020-08-07 NOTE — Telephone Encounter (Signed)
New Message:     Pt said he was told by his Pharmacist to let you know that they will be sending a refill today for his Eliquis. Pt needs this asap please, it will be coming from [employer].[name]

## 2020-08-07 NOTE — Telephone Encounter (Signed)
S/w pt is aware sent in Eliiquis to mail order which pt stated pharmacy aware and will expedite.  P t has 3 days left.  Pt advised if doesn't receive to call office to get samples.

## 2020-08-08 ENCOUNTER — Telehealth: Payer: Self-pay

## 2020-08-08 DIAGNOSIS — I10 Essential (primary) hypertension: Secondary | ICD-10-CM

## 2020-08-08 MED ORDER — QUINAPRIL HCL 20 MG PO TABS: 20.0000 mg | ORAL_TABLET | Freq: Every day | ORAL | 3 refills | Status: DC

## 2020-08-08 NOTE — Telephone Encounter (Signed)
[facility] faxed refill request for the following medications:  quinapril (ACCUPRIL) 20 MG tablet    Please advise.

## 2020-08-08 NOTE — Telephone Encounter (Signed)
Sent to pharmacy 

## 2020-08-16 ENCOUNTER — Ambulatory Visit: Payer: PPO | Admitting: Dermatology

## 2020-08-16 ENCOUNTER — Telehealth: Payer: Self-pay | Admitting: Cardiology

## 2020-08-16 NOTE — Telephone Encounter (Signed)
Pt c/o medication issue:  1. Name of Medication: care 1, generic for tylenol   2. How are you currently taking this medication (dosage and times per day)? Has not taken it yet  3. Are you having a reaction (difficulty breathing--STAT)? no  4. What is your medication issue? Patient states he was told he can take tylenol, but his wife only found a generic for it called Care 1. He would like to know if it is okay to take.

## 2020-08-16 NOTE — Telephone Encounter (Signed)
Returned call to patient, he states he has been taking extra strength tylenol for knee pain and he ran out.  His wife went to buy more and could only find "Careone" extra strength acetaminophen 500 mg.    He was wanting to make sure this is the same medication.  Also concerned that in the warnings it states "ask a doctor or pharmacist before use if you are taking the blood thinning drug warfarin."      Advised this is same medication, just a generic version and he is on Eliquis.  Ok to take as directed.  Patient verbalized understanding.

## 2020-08-23 NOTE — Discharge Instructions (Signed)
Instructions after Total Knee Replacement   Jared Rice, Jr., M.D.     Dept. of Orthopaedics & Sports Medicine  [facility]  [address]  [place], [place]  [address]  Phone: [phone]   Fax: [phone]    DIET: Drink plenty of non-alcoholic fluids. Resume your normal diet. Include foods high in fiber.  ACTIVITY:  You may use crutches or a walker with weight-bearing as tolerated, unless instructed otherwise. You may be weaned off of the walker or crutches by your Physical Therapist.  Do NOT place pillows under the knee. Anything placed under the knee could limit your ability to straighten the knee.   Continue doing gentle exercises. Exercising will reduce the pain and swelling, increase motion, and prevent muscle weakness.   Please continue to use the TED compression stockings for 6 weeks. You may remove the stockings at night, but should reapply them in the morning. Do not drive or operate any equipment until instructed.  WOUND CARE:  Continue to use the PolarCare or ice packs periodically to reduce pain and swelling. You may bathe or shower after the staples are removed at the first office visit following surgery.  MEDICATIONS: You may resume your regular medications. Please take the pain medication as prescribed on the medication. Do not take pain medication on an empty stomach. You have been given a prescription for a blood thinner (Lovenox or Coumadin). Please take the medication as instructed. (NOTE: After completing a 2 week course of Lovenox, take one Enteric-coated aspirin once a day. This along with elevation will help reduce the possibility of phlebitis in your operated leg.) Do not drive or drink alcoholic beverages when taking pain medications.  CALL THE OFFICE FOR: Temperature above 101 degrees Excessive bleeding or drainage on the dressing. Excessive swelling, coldness, or paleness of the toes. Persistent nausea and vomiting.  FOLLOW-UP:  You  should have an appointment to return to the office in 10-14 days after surgery. Arrangements have been made for continuation of Physical Therapy (either home therapy or outpatient therapy).   [facility] Department Directory         [email or web address]       [email or web address]          Cardiology  Appointments: [place] - [phone] [place] - [phone]  Endocrinology  Appointments: [place] - [phone] [place] - [phone]  Gastroenterology  Appointments: [place] - [phone] [place] - [phone]        General Surgery   Appointments: [place] - [phone]  Internal Medicine/Family Medicine  Appointments: [place] - [phone] [place] - [phone] [place] - [phone]  Metabolic and Weigh Loss Surgery  Appointments: [place] - [phone]        Neurology  Appointments: [place] - [phone] [place] - [phone]  Neurosurgery  Appointments: [place] - [phone]  Obstetrics & Gynecology  Appointments: [place] - [phone] [place] - [phone]        Pediatrics  Appointments: [place] - [phone] [place] - [phone]  Physiatry  Appointments: [place] -[phone]  Physical Therapy  Appointments: [place] - [phone] [place] - [phone]        Podiatry  Appointments: [place] - [phone] [place] - [phone]  Pulmonology  Appointments: [place] - [phone]  Rheumatology  Appointments: [place] - [phone]        [place] Location: [facility]  [address] [place], [place]  [address]  [place] Location: [facility] [address] [place], [place]  [address]  [place] Location: [facility] [address] [place], [place]  [address]    

## 2020-08-24 ENCOUNTER — Other Ambulatory Visit: Payer: Self-pay | Admitting: Orthopedic Surgery

## 2020-08-24 ENCOUNTER — Encounter
Admission: RE | Admit: 2020-08-24 | Discharge: 2020-08-24 | Disposition: A | Discharge status: Home / Self Care | Payer: PPO | Source: Ambulatory Visit | Attending: Orthopedic Surgery | Admitting: Orthopedic Surgery

## 2020-08-24 ENCOUNTER — Other Ambulatory Visit: Payer: Self-pay

## 2020-08-24 DIAGNOSIS — Z01818 Encounter for other preprocedural examination: Secondary | ICD-10-CM | POA: Insufficient documentation

## 2020-08-24 HISTORY — DX: Unspecified atrial fibrillation: I48.91

## 2020-08-24 HISTORY — DX: Other complications of anesthesia, initial encounter: T88.59XA

## 2020-08-24 HISTORY — DX: Personal history of urinary calculi: Z87.442

## 2020-08-24 HISTORY — DX: Gastro-esophageal reflux disease without esophagitis: K21.9

## 2020-08-24 HISTORY — DX: Unspecified osteoarthritis, unspecified site: M19.90

## 2020-08-24 HISTORY — DX: Cardiac murmur, unspecified: R01.1

## 2020-08-24 LAB — CBC
HCT: 37.3 % — ABNORMAL LOW (ref 39.0–52.0)
Hemoglobin: 11.6 g/dL — ABNORMAL LOW (ref 13.0–17.0)
MCH: 30.5 pg (ref 26.0–34.0)
MCHC: 31.1 g/dL (ref 30.0–36.0)
MCV: 98.2 fL (ref 80.0–100.0)
Platelets: 173 10*3/uL (ref 150–400)
RBC: 3.8 MIL/uL — ABNORMAL LOW (ref 4.22–5.81)
RDW: 14 % (ref 11.5–15.5)
WBC: 7.2 10*3/uL (ref 4.0–10.5)
nRBC: 0 % (ref 0.0–0.2)

## 2020-08-24 LAB — TYPE AND SCREEN
ABO/RH(D): O POS
Antibody Screen: NEGATIVE

## 2020-08-24 LAB — URINALYSIS, ROUTINE W REFLEX MICROSCOPIC
Bilirubin Urine: NEGATIVE
Glucose, UA: NEGATIVE mg/dL
Hgb urine dipstick: NEGATIVE
Ketones, ur: NEGATIVE mg/dL
Leukocytes,Ua: NEGATIVE
Nitrite: NEGATIVE
Protein, ur: NEGATIVE mg/dL
Specific Gravity, Urine: 1.013 (ref 1.005–1.030)
pH: 5 (ref 5.0–8.0)

## 2020-08-24 LAB — C-REACTIVE PROTEIN: CRP: 0.6 mg/dL (ref <1.0)

## 2020-08-24 LAB — SURGICAL PCR SCREEN
MRSA, PCR: NEGATIVE
Staphylococcus aureus: POSITIVE — AB

## 2020-08-24 LAB — COMPREHENSIVE METABOLIC PANEL
ALT: 6 U/L (ref 0–44)
AST: 10 U/L — ABNORMAL LOW (ref 15–41)
Albumin: 3.9 g/dL (ref 3.5–5.0)
Alkaline Phosphatase: 51 U/L (ref 38–126)
Anion gap: 9 (ref 5–15)
BUN: 16 mg/dL (ref 8–23)
CO2: 25 mmol/L (ref 22–32)
Calcium: 8.6 mg/dL — ABNORMAL LOW (ref 8.9–10.3)
Chloride: 107 mmol/L (ref 98–111)
Creatinine, Ser: 1.12 mg/dL (ref 0.61–1.24)
GFR, Estimated: >60 mL/min (ref >60)
Glucose, Bld: 90 mg/dL (ref 70–99)
Potassium: 4 mmol/L (ref 3.5–5.1)
Sodium: 141 mmol/L (ref 135–145)
Total Bilirubin: 0.7 mg/dL (ref 0.3–1.2)
Total Protein: 6.4 g/dL — ABNORMAL LOW (ref 6.5–8.1)

## 2020-08-24 LAB — SEDIMENTATION RATE: Sed Rate: 9 mm/hr (ref 0–20)

## 2020-08-24 LAB — APTT: aPTT: 39 seconds — ABNORMAL HIGH (ref 24–36)

## 2020-08-24 LAB — PROTIME-INR
INR: 1.3 — ABNORMAL HIGH (ref 0.8–1.2)
Prothrombin Time: 15.9 seconds — ABNORMAL HIGH (ref 11.4–15.2)

## 2020-08-24 NOTE — Patient Instructions (Addendum)
Your procedure is scheduled on:09-06-20  WEDNESDAY Report to the Registration Desk on the 1st floor of the [facility]-Then proceed to the 2nd floor Surgery desk in the [facility] To find out your arrival time, please call [phone] between 1PM - 3PM on:09-05-20 TUESDAY  REMEMBER: Instructions that are not followed completely may result in serious medical risk, up to and including death; or upon the discretion of your surgeon and anesthesiologist your surgery may need to be rescheduled.  Do not eat food after midnight the night before surgery.  No gum chewing, lozengers or hard candies.  You may however, drink WATER up to 2 hours before you are scheduled to arrive for your surgery. Do not drink anything within 2 hours of your scheduled arrival time.  Type 1 and Type 2 diabetics should only drink water.  TAKE THESE MEDICATIONS THE MORNING OF SURGERY WITH A SIP OF WATER: -SYNTHROID (LEVOTHYROXINE) -TOPAMAX (TOPIRAMATE)  Follow recommendations from Cardiologist, Pulmonologist or PCP regarding stopping Aspirin, Coumadin, Plavix, Eliquis, Pradaxa, or Pletal-STOP ELIQUIS 3 DAYS PRIOR TO SURGERY PER DR HOOTEN-LAST DOSE WILL BE ON  09-02-20 SATURDAY  One week prior to surgery: Stop Anti-inflammatories (NSAIDS) such as Advil, Aleve, Ibuprofen, Motrin, Naproxen, Naprosyn and Aspirin based products such as Excedrin, Goodys Powder, BC Powder-OK TO TAKE TYLENOL IF NEEDED  Stop ANY OVER THE COUNTER supplements until after surgery.  No Alcohol for 24 hours before or after surgery.  No Smoking including e-cigarettes for 24 hours prior to surgery.  No chewable tobacco products for at least 6 hours prior to surgery.  No nicotine patches on the day of surgery.  Do not use any "recreational" drugs for at least a week prior to your surgery.  Please be advised that the combination of cocaine and anesthesia may have negative outcomes, up to and including death. If you test positive for cocaine, your  surgery will be cancelled.  On the morning of surgery brush your teeth with toothpaste and water, you may rinse your mouth with mouthwash if you wish. Do not swallow any toothpaste or mouthwash.  Do not wear jewelry, make-up, hairpins, clips or nail polish.  Do not wear lotions, powders, or perfumes.   Do not shave body from the neck down 48 hours prior to surgery just in case you cut yourself which could leave a site for infection.  Also, freshly shaved skin may become irritated if using the CHG soap.  Contact lenses, hearing aids and dentures may not be worn into surgery.  Do not bring valuables to the hospital. [facility] is not responsible for any missing/lost belongings or valuables.   Use CHG Soap as directed on instruction sheet.  Notify your doctor if there is any change in your medical condition (cold, fever, infection).  Wear comfortable clothing (specific to your surgery type) to the hospital.  Plan for stool softeners for home use; pain medications have a tendency to cause constipation. You can also help prevent constipation by eating foods high in fiber such as fruits and vegetables and drinking plenty of fluids as your diet allows.  After surgery, you can help prevent lung complications by doing breathing exercises.  Take deep breaths and cough every 1-2 hours. Your doctor may order a device called an Incentive Spirometer to help you take deep breaths. When coughing or sneezing, hold a pillow firmly against your incision with both hands. This is called "splinting." Doing this helps protect your incision. It also decreases belly discomfort.  If you are  being admitted to the hospital overnight, leave your suitcase in the car. After surgery it may be brought to your room.  If you are being discharged the day of surgery, you will not be allowed to drive home. You will need a responsible adult (18 years or older) to drive you home and stay with you that night.   If you  are taking public transportation, you will need to have a responsible adult (18 years or older) with you. Please confirm with your physician that it is acceptable to use public transportation.   Please call the [place] Dept. at [phone] if you have any questions about these instructions.  Surgery Visitation Policy:  Patients undergoing a surgery or procedure may have one family member or support person with them as long as that person is not COVID-19 positive or experiencing its symptoms.  That person may remain in the waiting area during the procedure.  Inpatient Visitation:    Visiting hours are 7 a.m. to 8 p.m. Inpatients will be allowed two visitors daily. The visitors may change each day during the patient's stay. No visitors under the age of 10. Any visitor under the age of 16 must be accompanied by an adult. The visitor must pass COVID-19 screenings, use hand sanitizer when entering and exiting the patient's room and wear a mask at all times, including in the patient's room. Patients must also wear a mask when staff or their visitor are in the room. Masking is required regardless of vaccination status.

## 2020-08-25 ENCOUNTER — Inpatient Hospital Stay: Admission: RE | Admit: 2020-08-25 | Payer: PPO | Source: Ambulatory Visit

## 2020-08-25 ENCOUNTER — Telehealth: Payer: Self-pay | Admitting: Cardiology

## 2020-08-25 NOTE — Telephone Encounter (Signed)
Follow up:     patient calling to see if his last EKG can be sent to Dr. Marry Guan office.

## 2020-08-25 NOTE — Progress Notes (Signed)
Perioperative Services  Pre-Admission/Anesthesia Testing Clinical Review  Date: 08/29/20  Patient Demographics:  Name: Jared Rice DOB:   1933-01-11 MRN:   [id number]  Planned Surgical Procedure(s):    Case: [id number] Date/Time: 09/06/20 1113   Procedure: COMPUTER ASSISTED TOTAL KNEE ARTHROPLASTY (Right Knee)   Anesthesia type: Choice   Pre-op diagnosis: PRIMARY OSTEOARTHRITIS OF RIGHT KNEE.   Location: [facility] OR ROOM 01 / [place] ORS FOR ANESTHESIA GROUP   Surgeons: Dereck Leep, MD    NOTE: Available PAT nursing documentation and vital signs have been reviewed. Clinical nursing staff has updated patient's PMH/PSHx, current medication list, and drug allergies/intolerances to ensure comprehensive history available to assist in medical decision making as it pertains to the aforementioned surgical procedure and anticipated anesthetic course.   Clinical Discussion:  Jared Rice is a 87 y.o. male who is submitted for pre-surgical anesthesia review and clearance prior to him undergoing the above procedure.Patient is a Former Smoker (68 pack years; quit 11/1980). Pertinent PMH includes: CAD, atrial fibrillation, LBBB, angina, cardiac murmur, aortic atherosclerosis, HTN, HLD, T2DM, hypothyroidism, OSAH (does not use nocturnal PAP therapy), GERD, IDA, OA  Patient is followed by cardiology (Martinique, MD). He was last seen in the cardiology clinic on 06/14/2020; notes reviewed.  At the time of his clinic visit, patient was feeling well overall from a cardiovascular perspective.  He denied chest pain, shortness breath, PND, orthopnea, palpitations, significant peripheral edema, vertiginous symptoms, or presyncope/syncope.  PMH significant for coronary artery disease.  Patient has undergone several diagnostic cardiac catheterizations in the past revealing nonobstructive disease with scattered 20-30% lesions.  Patient also has a history of atrial fibrillation. CHA2DS2-VASc Score = 5 (age x 2, HTN,  aortic plaque, T2DM). Patient chronically anticoagulated using apixaban; compliant with therapy with no evidence of GI bleeding.  Last TTE performed on 02/25/2019 revealed normal left ventricular systolic function (LVEF >88%) with mild tricuspid regurgitation (see full interpretation of cardiovascular testing below).  Patient is on GDMT for his HTN and HLD diagnoses.  Blood pressure well controlled at 114/72 on currently prescribed diuretic, ACEi, and alpha-blocker therapies. He is on a statin for his HLD.  T2DM well-controlled on currently prescribed regimen; last hemoglobin A1c was 5.9% on 07/05/2020.  Functional capacity limited by orthopedic pain, however patient still felt to be able to achieve >/= 4 METS of physical activity without angina/anginal equivalent symptoms.  No changes were made to patient's medication regimen.  Patient to follow-up with outpatient cardiology in 6 months or sooner if needed.  Patient is scheduled to undergo an elective orthopedic procedure on 09/06/2020 with Dr. Skip Estimable.  Given patient's past medical history significant for cardiovascular disease, presurgical cardiac clearance was sought by the PAT team. Per cardiology, "RCRI is a class I risk representing a 0.4% risk of  MACE.  Therefore, based on patient's past medical history and time since his last clinic visit, patient would be at an overall ACCEPTABLE risk for the planned procedure without further cardiovascular testing or intervention at this time".  Again, patient is on daily anticoagulation therapy.  He has been instructed on recommendations from cardiology for holding his apixaban for 3 days prior to his procedure with plans to restart as soon as postoperative bleeding risk felt to be minimized by primary attending surgeon.  Patient is aware that his last dose of apixaban 09/01/2020.  Patient reports previous perioperative complications with anesthesia in the past.  He reports that neuraxial anesthetic course was  ineffective with last orthopedic procedure  and ended up having to undergo GETA. in review of the available records, it is noted that patient underwent a MAC anesthetic course at [facility] (ASA III) in 12/2014 without documented complications.   Vitals with BMI 08/24/2020 06/22/2020 06/14/2020  Height _0  _1  -  Weight 257 lbs 8 oz 264 lbs 260 lbs  BMI 42.70 62.37 -  Systolic 628 315 176  Diastolic 68 94 72  Pulse 85 74 83    Providers/Specialists:   NOTE: Primary physician provider listed below. Patient may have been seen by APP or partner within same practice.   PROVIDER ROLE / SPECIALTY LAST OV  Hooten, Laurice Record, MD Orthopedics (Surgeon)  06/21/2020  Jerrol Banana., MD Primary Care Provider  06/22/2020  Martinique, Peter, MD Cardiology  06/14/2020   Allergies:  Dexilant [dexlansoprazole] and Primidone  Current Home Medications:   No current facility-administered medications for this encounter.   [name] acetaminophen (TYLENOL) 500 MG tablet  . apixaban (ELIQUIS) 5 MG TABS tablet  . diclofenac Sodium (VOLTAREN) 1 % GEL  . diphenhydramine-acetaminophen (TYLENOL PM) 25-500 MG TABS  . doxazosin (CARDURA) 8 MG tablet  . furosemide (LASIX) 40 MG tablet  . levothyroxine (SYNTHROID) 75 MCG tablet  . quinapril (ACCUPRIL) 20 MG tablet  . rosuvastatin (CRESTOR) 20 MG tablet  . topiramate (TOPAMAX) 50 MG tablet  . fluconazole (DIFLUCAN) 200 MG tablet  . furosemide (LASIX) 20 MG tablet   History:   Past Medical History:  Diagnosis Date  . Actinic keratosis   . Anginal pain ([place])   . Aortic atherosclerosis ([place])   . Arthritis   . Atrial fibrillation ([place])   . Basal cell carcinoma 02/06/2010   Left shoulder. Superficial.  . Basal cell carcinoma 10/11/2013   Right medial forearm. Superficial  . Basal cell carcinoma 04/10/2015   Right inf. lat. thigh. Superficial  . Basal cell carcinoma 12/09/2016   Right cheek. Superficial.  . Chronic airway obstruction, not  elsewhere classified   . Colon polyps   . Complication of anesthesia    SPINAL DID NOT WORK FOR LAST KNEE REPLACEMENT AND HAD TO HAVE GA  . Coronary atherosclerosis of native coronary artery    nonobstructive  . Diabetes mellitus without complication ([place])   . Essential hypertension, benign   . GERD (gastroesophageal reflux disease)    H/O  . Glucose intolerance (impaired glucose tolerance)   . Heart murmur   . History of kidney stones   . Hyperlipidemia   . Hypothyroidism   . Iron deficiency anemia   . Knee pain, right   . LBBB (left bundle branch block)   . Occlusion and stenosis of carotid artery without mention of cerebral infarction    wears compression stockings  . Other dyspnea and respiratory abnormality    w/ pseuodowheeze resolves with purse lip manuever  . Precordial pain   . Shoulder pain, right   . Squamous cell carcinoma of skin 10/06/2007   Right forearm. SCCis  . Squamous cell carcinoma of skin 10/11/2013   Left mid lat. pretibial. KA-like pattern.  . Tremor   . Unspecified sleep apnea    HAS NOT USED CPAP IN 20 YEARS  . Venous insufficiency   . Wears dentures    full upper   Past Surgical History:  Procedure Laterality Date  . CARDIAC CATHETERIZATION     X2  . COLONOSCOPY  03/17/12   Dr Byrnett-diverticulosis  . ESOPHAGOGASTRODUODENOSCOPY (EGD) WITH PROPOFOL N/A 11/25/2014   Procedure: ESOPHAGOGASTRODUODENOSCOPY (  EGD) WITH PROPOFOL;  Surgeon: Lucilla Lame, MD;  Location: [facility];  Service: Endoscopy;  Laterality: N/A;  with biopsy  . ESOPHAGOGASTRODUODENOSCOPY (EGD) WITH PROPOFOL N/A 01/05/2015   Procedure: ESOPHAGOGASTRODUODENOSCOPY (EGD) WITH PROPOFOL;  Surgeon: Lucilla Lame, MD;  Location: [facility];  Service: Endoscopy;  Laterality: N/A;  . EYE SURGERY Bilateral    cataract   . TOTAL KNEE ARTHROPLASTY Left 2012   Family History  Problem Relation Age of Onset  . Heart failure Mother 45       congestive  . Heart attack Father 11   . Alcohol abuse Father   . Alzheimer's disease Brother 44  . Alzheimer's disease Sister 60  . Colon cancer Neg Hx   . Liver disease Neg Hx    Social History   Tobacco Use  . Smoking status: Former Smoker    Packs/day: 2.00    Years: 31.00    Pack years: 62.00    Types: Cigarettes    Quit date: 12/24/1980    Years since quitting: 39.7  . Smokeless tobacco: Never Used  . Tobacco comment: quit in 1982  Vaping Use  . Vaping Use: Never used  Substance Use Topics  . Alcohol use: No    Alcohol/week: 0.0 standard drinks  . Drug use: No    Pertinent Clinical Results:  LABS: Labs reviewed: Acceptable for surgery.  No visits with results within 3 Day(s) from this visit.  Latest known visit with results is:  Hospital Outpatient Visit on 08/24/2020  Component Date Value Ref Range Status  . CRP 08/24/2020 0.6  <1.0 mg/dL Final   Performed at [facility] [address] [place], [place] [address]  . MRSA, PCR 08/24/2020 NEGATIVE  NEGATIVE Final  . Staphylococcus aureus 08/24/2020 POSITIVE* NEGATIVE Final   Comment: (NOTE) The Xpert SA Assay (FDA approved for NASAL specimens in patients 42 years of age and older), is one component of a comprehensive surveillance program. It is not intended to diagnose infection nor to guide or monitor treatment. Performed at [facility], [address], [place], [place] [address]   . Sed Rate 08/24/2020 9  0 - 20 mm/hr Final   Performed at [facility], [address] [place], [place] [address]  . WBC 08/24/2020 7.2  4.0 - 10.5 K/uL Final  . RBC 08/24/2020 3.80* 4.22 - 5.81 MIL/uL Final  . Hemoglobin 08/24/2020 11.6* 13.0 - 17.0 g/dL Final  . HCT 08/24/2020 37.3* 39.0 - 52.0 % Final  . MCV 08/24/2020 98.2  80.0 - 100.0 fL Final  . MCH 08/24/2020 30.5  26.0 - 34.0 pg Final  . MCHC 08/24/2020 31.1  30.0 - 36.0 g/dL Final  . RDW 08/24/2020 14.0  11.5 - 15.5 % Final  . Platelets 08/24/2020 173  150 - 400 K/uL Final   . nRBC 08/24/2020 0.0  0.0 - 0.2 % Final   Performed at [facility], [address], [place], [place] [address]  . Sodium 08/24/2020 141  135 - 145 mmol/L Final  . Potassium 08/24/2020 4.0  3.5 - 5.1 mmol/L Final  . Chloride 08/24/2020 107  98 - 111 mmol/L Final  . CO2 08/24/2020 25  22 - 32 mmol/L Final  . Glucose, Bld 08/24/2020 90  70 - 99 mg/dL Final   Glucose reference range applies only to samples taken after fasting for at least 8 hours.  . BUN 08/24/2020 16  8 - 23 mg/dL Final  . Creatinine, Ser 08/24/2020  1.12  0.61 - 1.24 mg/dL Final  . Calcium 08/24/2020 8.6* 8.9 - 10.3 mg/dL Final  . Total Protein 08/24/2020 6.4* 6.5 - 8.1 g/dL Final  . Albumin 08/24/2020 3.9  3.5 - 5.0 g/dL Final  . AST 08/24/2020 10* 15 - 41 U/L Final  . ALT 08/24/2020 6  0 - 44 U/L Final  . Alkaline Phosphatase 08/24/2020 51  38 - 126 U/L Final  . Total Bilirubin 08/24/2020 0.7  0.3 - 1.2 mg/dL Final  . GFR, Estimated 08/24/2020 >60  >60 mL/min Final   Comment: (NOTE) Calculated using the CKD-EPI Creatinine Equation (2021)   . Anion gap 08/24/2020 9  5 - 15 Final   Performed at [facility], [address] [place], [place] [address]  . Prothrombin Time 08/24/2020 15.9* 11.4 - 15.2 seconds Final  . INR 08/24/2020 1.3* 0.8 - 1.2 Final   Comment: (NOTE) INR goal varies based on device and disease states. Performed at [facility], [address], [place], [place] [address]   . aPTT 08/24/2020 39* 24 - 36 seconds Final   Comment:        IF BASELINE aPTT IS ELEVATED, SUGGEST PATIENT RISK ASSESSMENT BE USED TO DETERMINE APPROPRIATE ANTICOAGULANT THERAPY. Performed at [facility], [address], [place], [place] [address]   . Color, Urine 08/24/2020 YELLOW* YELLOW Final  . APPearance 08/24/2020 CLEAR* CLEAR Final  . Specific Gravity, Urine 08/24/2020 1.013  1.005 - 1.030 Final  . pH 08/24/2020 5.0  5.0 - 8.0 Final  . Glucose, UA 08/24/2020 NEGATIVE   NEGATIVE mg/dL Final  . Hgb urine dipstick 08/24/2020 NEGATIVE  NEGATIVE Final  . Bilirubin Urine 08/24/2020 NEGATIVE  NEGATIVE Final  . Ketones, ur 08/24/2020 NEGATIVE  NEGATIVE mg/dL Final  . Protein, ur 08/24/2020 NEGATIVE  NEGATIVE mg/dL Final  . Nitrite 08/24/2020 NEGATIVE  NEGATIVE Final  . Chalmers Guest 08/24/2020 NEGATIVE  NEGATIVE Final   Performed at [facility], [address], [place], [place] [address]  . Specimen Description 08/24/2020    Final                   Value:URINE, RANDOM Performed at [facility] [place] [facility], [address] [place], [place] [address]   . Special Requests 08/24/2020    Final                   Value:Normal Performed at [facility], [address] [place], [place] [address]   . Culture 08/24/2020    Final                   Value:NO GROWTH Performed at [facility], [place] [address] [place], [place] [address]   . Report Status 08/24/2020 08/26/2020 FINAL   Final  . ABO/RH(D) 08/24/2020 O POS   Final  . Antibody Screen 08/24/2020 NEG   Final  . Sample Expiration 08/24/2020 09/07/2020,2359   Final  . Extend sample reason 08/24/2020    Final                   Value:NO TRANSFUSIONS OR PREGNANCY IN THE PAST 3 MONTHS Performed at [facility], [address] [place],  [address]     ECG: Date: 06/14/2020 Time ECG obtained: 1557 PM Rate: 83 bpm Axis: Left Rhythm: atrial fibrillation; LBBB Intervals: QRS 156 ms. QTc 531 ms. ST segment and T wave changes: No evidence of acute ST segment elevation or depression Comparison: Similar to  previous tracing obtained on 03/01/2019.   IMAGING / PROCEDURES: ECHOCARDIOGRAM performed on 02/25/2019 1. The left ventricle is normal in size with mildly increased wall thickness 2. Normal left ventricular systolic function with an EF of >55% 3. Normal right ventricular size and systolic function 4. Mild tricuspid valve regurgitation 5. No valvular stenosis 6. No  evidence of a pericardial effusion  Impression and Plan:  Janelle Culton Smarr has been referred for pre-anesthesia review and clearance prior to him undergoing the planned anesthetic and procedural courses. Available labs, pertinent testing, and imaging results were personally reviewed by me. This patient has been appropriately cleared by cardiology with an overall ACCEPTABLE risk of significant perioperative cardiovascular complications.  Based on clinical review performed today (08/29/20), barring any significant acute changes in the patient's overall condition, it is anticipated that he will be able to proceed with the planned surgical intervention. Any acute changes in clinical condition may necessitate his procedure being postponed and/or cancelled. Pre-surgical instructions were reviewed with the patient during his PAT appointment and questions were fielded by PAT clinical staff.  Honor Loh, MSN, APRN, FNP-C, CEN [facility]  Peri-operative Services Nurse Practitioner Phone: [phone] 08/29/20 11:38 AM  NOTE: This note has been prepared using Dragon dictation software. Despite my best ability to proofread, there is always the potential that unintentional transcriptional errors may still occur from this process.

## 2020-08-26 LAB — URINE CULTURE
Culture: NO GROWTH
Special Requests: NORMAL

## 2020-08-29 ENCOUNTER — Encounter: Payer: Self-pay | Admitting: Orthopedic Surgery

## 2020-08-31 DIAGNOSIS — M1711 Unilateral primary osteoarthritis, right knee: Secondary | ICD-10-CM | POA: Diagnosis not present

## 2020-09-03 NOTE — H&P (Signed)
ORTHOPAEDIC HISTORY & PHYSICAL Jared Rice, [place] - 08/31/2020 10:45 AM EDT Formatting of this note is different from the original. [facility] Chief Complaint:   Chief Complaint  Patient presents with  . Knee Pain  H & P RIGHT KNEE   History of Present Illness:   Jared Rice is a 87 y.o. male that presents to clinic today for his preoperative history and evaluation. Patient presents with his wife. The patient is scheduled to undergo a right total knee arthroplasty on 09/06/20 by Dr. Marry Guan. His pain began several years ago. The pain is located primarily along the lateral aspect of the knee. He describes his pain as worse with weightbearing. He reports associated swelling with some giving way of the knee. He denies associated numbness or tingling, denies locking.   The patient's symptoms have progressed to the point that they decrease his quality of life. The patient has previously undergone conservative treatment including activity modification and injections to the knee without adequate control of his symptoms. Unable to tolerate NSAIDs due to anticoagulation.  He has received cardiac clearance. Denies history of blood clots, lumbar surgery. No pertinent drug allergies.  Past Medical, Surgical, Family, Social History, Allergies, Medications:   Past Medical History:  Past Medical History:  Diagnosis Date  . Atrial fibrillation (CMS-HCC)  . Chickenpox  . History of hyperlipidemia  not currently  . Hyperlipidemia  . Hypertension  . Hypothyroidism  . Lymphoma (CMS-HCC)  . Status post colonoscopy  . Thyroid disease  . Tremor   Past Surgical History:  Past Surgical History:  Procedure Laterality Date  . APPENDECTOMY  . Back surgery  . COLONOSCOPY  . Heart catheterization  . Left total knee arthroplasty using computer-assisted navigation 09/03/2010  Dr Marry Guan   Current Medications:  Current Outpatient Medications   Medication Sig Dispense Refill  . apixaban (ELIQUIS) 5 mg tablet Take 5 mg by mouth 2 (two) times daily  . diclofenac (VOLTAREN) 1 % topical gel Apply 2 g topically 4 (four) times daily as needed  . diphenhydramine-acetaminophen (TYLENOL PM EXTRA STRENGTH) 25-500 mg per tablet Take 2 tablets by mouth nightly as needed  . doxazosin (CARDURA) 8 MG tablet Take 8 mg by mouth once daily.  . FUROsemide (LASIX) 40 MG tablet Take 20 mg by mouth once daily Take 1 tablet (40 mg total) by mouth daily  . levothyroxine (SYNTHROID, LEVOTHROID) 75 MCG tablet Take 75 mcg by mouth once daily. Take on an empty stomach with a glass of water at least 30-60 minutes before breakfast.  . quinapriL (ACCUPRIL) 20 MG tablet Take 20 mg by mouth once daily  . rosuvastatin (CRESTOR) 10 MG tablet 10 mg once daily  . topiramate (TOPAMAX) 50 MG tablet Take 50 mg by mouth 2 (two) times daily  . triamcinolone 0.1 % cream APPLY TO THE AFFECTED AREAS FOR RASH TWICE DAILY. AVOID FACE GROIN AXILLA  . rosuvastatin (CRESTOR) 20 MG tablet Take 1 tablet by mouth every day   No current facility-administered medications for this visit.   Allergies:  Allergies  Allergen Reactions  . Dexlansoprazole Rash  . Primidone Itching and Rash   Social History:  Social History   Socioeconomic History  . Marital status: Married  Spouse name: [name]  . Number of children: 2  . Years of education: 16  . Highest education level: Bachelor's degree (e.g., BA, AB, BS)  Occupational History  . Occupation: Retired  Tobacco Use  . Smoking status:  Former Smoker  Types: Cigarettes  Quit date: 1982  Years since quitting: 40.2  . Smokeless tobacco: Never Used  Substance and Sexual Activity  . Alcohol use: No  Alcohol/week: 0.0 standard drinks  Comment: SOCIALLY  . Drug use: Never  . Sexual activity: Defer  Partners: Female  Other Topics Concern  . Not on file  Social History Narrative  . Not on file   Social Determinants of Health    Financial Resource Strain: Not on file  Food Insecurity: Not on file  Transportation Needs: Not on file  Physical Activity: Not on file  Stress: Not on file  Social Connections: Not on file  Housing Stability: Not on file   Family History:  Family History  Problem Relation Age of Onset  . Heart failure Mother  congestive  . Aneurysm Mother  . Myocardial Infarction (Heart attack) Father  . Alzheimer's disease Sister  . Alzheimer's disease Brother   Review of Systems:   A 10+ ROS was performed, reviewed, and the pertinent orthopaedic findings are documented in the HPI.   Physical Examination:   BP 130/80 (BP Location: Left upper arm, Patient Position: Sitting, BP Cuff Size: Large Adult)  Ht 188 cm (6\' 2" )  Wt (!) 116.5 kg (256 lb 12.8 oz)  BMI 32.97 kg/m   Patient is a well-developed, well-nourished male in no acute distress. Patient has normal mood and affect. Patient is alert and oriented to person, place, and time.   HEENT: Atraumatic, normocephalic. Pupils equal and reactive to light. Extraocular motion intact. Noninjected sclera.  Cardiovascular: Irregular rate and rhythm, with no murmurs, rubs, or gallops. Distal pulses auscultated with doppler.   Respiratory: Lungs clear to auscultation bilaterally.   Right Knee: Soft tissue swelling: mild Effusion: none Erythema: none Crepitance: mild Tenderness: lateral Alignment: relative valgus Mediolateral laxity: lateral pseudolaxity Posterior sag: negative Patellar tracking: Good tracking without evidence of subluxation or tilt Atrophy: No significant atrophy.  Quadriceps tone was fair to good. Range of motion: 0/15/93 degrees   2+ pitting edema noted over bilaterel LE. Skin changes associated with chronic venous insufficiency noted bilaterally.  Sensation intact over the saphenous, lateral sural cutaneous, superficial fibular, and deep fibular nerve distributions.  Tests Performed/Reviewed:  X-rays  3 views  of the right knee were obtained. Images reveal complete loss of lateral compartment joint space with bone-on-bone contact and osteophyte formation. Severe loss of medial compartment joint space noted. Severe loss of patellofemoral joint space noted. No fractures or dislocations.  Impression:   ICD-10-CM  1. Primary osteoarthritis of right knee M17.11   Plan:   The patient has end-stage degenerative changes of the right knee. It was explained to the patient that the condition is progressive in nature. Having failed conservative treatment, the patient has elected to proceed with a total joint arthroplasty. The patient will undergo a total joint arthroplasty with Dr. Marry Guan. The risks of surgery, including blood clot and infection, were discussed with the patient. Measures to reduce these risks, including the use of anticoagulation, perioperative antibiotics, and early ambulation were discussed. The importance of postoperative physical therapy was discussed with the patient. The patient elects to proceed with surgery. The patient is instructed to stop all blood thinners prior to surgery. The patient is instructed to call the hospital the day before surgery to learn of the proper arrival time.   Contact our office with any questions or concerns. Follow up as indicated, or sooner should any new problems arise, if conditions worsen, or if  they are otherwise concerned.   Jared Fudge, PA-C [facility] [address], [place] [address] Phone: [phone]  This note was generated in part with voice recognition software and I apologize for any typographical errors that were not detected and corrected.  Electronically signed by Jared Fudge, PA at 08/31/2020 10:02 PM EDT

## 2020-09-04 ENCOUNTER — Other Ambulatory Visit
Admission: RE | Admit: 2020-09-04 | Discharge: 2020-09-04 | Disposition: A | Discharge status: Home / Self Care | Payer: PPO | Source: Ambulatory Visit | Attending: Orthopedic Surgery | Admitting: Orthopedic Surgery

## 2020-09-04 ENCOUNTER — Other Ambulatory Visit: Payer: Self-pay

## 2020-09-04 DIAGNOSIS — Z01812 Encounter for preprocedural laboratory examination: Secondary | ICD-10-CM | POA: Insufficient documentation

## 2020-09-04 DIAGNOSIS — Z20822 Contact with and (suspected) exposure to covid-19: Secondary | ICD-10-CM | POA: Insufficient documentation

## 2020-09-05 LAB — SARS CORONAVIRUS 2 (TAT 6-24 HRS): SARS Coronavirus 2: NEGATIVE

## 2020-09-06 ENCOUNTER — Ambulatory Visit: Payer: PPO | Admitting: Urgent Care

## 2020-09-06 ENCOUNTER — Encounter
Admission: RE | Disposition: A | Discharge status: Home / Self Care | Payer: Self-pay | Source: Home / Self Care | Attending: Orthopedic Surgery

## 2020-09-06 ENCOUNTER — Observation Stay: Payer: PPO

## 2020-09-06 ENCOUNTER — Observation Stay
Admission: RE | Admit: 2020-09-06 | Discharge: 2020-09-07 | Disposition: A | Discharge status: Home / Self Care | Payer: PPO | Attending: Orthopedic Surgery | Admitting: Orthopedic Surgery

## 2020-09-06 ENCOUNTER — Other Ambulatory Visit: Payer: Self-pay

## 2020-09-06 ENCOUNTER — Encounter: Payer: Self-pay | Admitting: Orthopedic Surgery

## 2020-09-06 DIAGNOSIS — I10 Essential (primary) hypertension: Secondary | ICD-10-CM | POA: Insufficient documentation

## 2020-09-06 DIAGNOSIS — Z96652 Presence of left artificial knee joint: Secondary | ICD-10-CM | POA: Diagnosis not present

## 2020-09-06 DIAGNOSIS — Z471 Aftercare following joint replacement surgery: Secondary | ICD-10-CM | POA: Diagnosis not present

## 2020-09-06 DIAGNOSIS — M1711 Unilateral primary osteoarthritis, right knee: Principal | ICD-10-CM | POA: Insufficient documentation

## 2020-09-06 DIAGNOSIS — Z79899 Other long term (current) drug therapy: Secondary | ICD-10-CM | POA: Diagnosis not present

## 2020-09-06 DIAGNOSIS — E039 Hypothyroidism, unspecified: Secondary | ICD-10-CM | POA: Diagnosis not present

## 2020-09-06 DIAGNOSIS — Z96651 Presence of right artificial knee joint: Secondary | ICD-10-CM | POA: Diagnosis not present

## 2020-09-06 DIAGNOSIS — I4891 Unspecified atrial fibrillation: Secondary | ICD-10-CM | POA: Diagnosis not present

## 2020-09-06 DIAGNOSIS — K219 Gastro-esophageal reflux disease without esophagitis: Secondary | ICD-10-CM | POA: Diagnosis not present

## 2020-09-06 DIAGNOSIS — Z87891 Personal history of nicotine dependence: Secondary | ICD-10-CM | POA: Diagnosis not present

## 2020-09-06 DIAGNOSIS — Z96659 Presence of unspecified artificial knee joint: Secondary | ICD-10-CM

## 2020-09-06 DIAGNOSIS — M25561 Pain in right knee: Secondary | ICD-10-CM | POA: Diagnosis present

## 2020-09-06 HISTORY — DX: Atherosclerosis of aorta: I70.0

## 2020-09-06 HISTORY — PX: KNEE ARTHROPLASTY: SHX992

## 2020-09-06 HISTORY — DX: Left bundle-branch block, unspecified: I44.7

## 2020-09-06 LAB — GLUCOSE, CAPILLARY
Glucose-Capillary: 93 mg/dL (ref 70–99)
Glucose-Capillary: 115 mg/dL — ABNORMAL HIGH (ref 70–99)
Glucose-Capillary: 138 mg/dL — ABNORMAL HIGH (ref 70–99)

## 2020-09-06 LAB — ABO/RH: ABO/RH(D): O POS

## 2020-09-06 SURGERY — ARTHROPLASTY, KNEE, TOTAL, USING IMAGELESS COMPUTER-ASSISTED NAVIGATION
Anesthesia: Spinal | Site: Knee | Laterality: Right

## 2020-09-06 MED ORDER — MENTHOL 3 MG MT LOZG
1.0000 | LOZENGE | OROMUCOSAL | Status: DC | PRN
  Filled 2020-09-06: qty 9

## 2020-09-06 MED ORDER — DEXAMETHASONE SODIUM PHOSPHATE 10 MG/ML IJ SOLN: 8.0000 mg | Freq: Once | INTRAMUSCULAR | Status: AC

## 2020-09-06 MED ORDER — NEOMYCIN-POLYMYXIN B GU 40-200000 IR SOLN
Status: AC
  Filled 2020-09-06: qty 1

## 2020-09-06 MED ORDER — OXYCODONE HCL 5 MG PO TABS
10.0000 mg | ORAL_TABLET | ORAL | Status: DC | PRN
  Administered 2020-09-06 – 2020-09-07 (×2): 10 mg via ORAL
  Filled 2020-09-06 (×3): qty 2

## 2020-09-06 MED ORDER — TRAMADOL HCL 50 MG PO TABS
50.0000 mg | ORAL_TABLET | ORAL | Status: DC | PRN
  Administered 2020-09-06: 100 mg via ORAL
  Administered 2020-09-07: 50 mg via ORAL
  Filled 2020-09-06: qty 2
  Filled 2020-09-06: qty 1

## 2020-09-06 MED ORDER — CEFAZOLIN SODIUM-DEXTROSE 2-4 GM/100ML-% IV SOLN
2.0000 g | INTRAVENOUS | Status: AC
  Administered 2020-09-06: 2 g via INTRAVENOUS

## 2020-09-06 MED ORDER — CEFAZOLIN SODIUM-DEXTROSE 2-4 GM/100ML-% IV SOLN
2.0000 g | Freq: Four times a day (QID) | INTRAVENOUS | Status: AC
  Administered 2020-09-06 – 2020-09-07 (×2): 2 g via INTRAVENOUS
  Filled 2020-09-06 (×2): qty 100

## 2020-09-06 MED ORDER — FENTANYL CITRATE (PF) 100 MCG/2ML IJ SOLN
INTRAMUSCULAR | Status: DC | PRN
  Administered 2020-09-06: 25 ug via INTRAVENOUS

## 2020-09-06 MED ORDER — SENNOSIDES-DOCUSATE SODIUM 8.6-50 MG PO TABS
1.0000 | ORAL_TABLET | Freq: Two times a day (BID) | ORAL | Status: DC
  Administered 2020-09-06 – 2020-09-07 (×2): 1 via ORAL
  Filled 2020-09-06 (×2): qty 1

## 2020-09-06 MED ORDER — ONDANSETRON HCL 4 MG PO TABS: 4.0000 mg | ORAL_TABLET | Freq: Four times a day (QID) | ORAL | Status: DC | PRN

## 2020-09-06 MED ORDER — APIXABAN 5 MG PO TABS
5.0000 mg | ORAL_TABLET | Freq: Two times a day (BID) | ORAL | Status: DC
  Administered 2020-09-07: 5 mg via ORAL
  Filled 2020-09-06: qty 1

## 2020-09-06 MED ORDER — ACETAMINOPHEN 325 MG PO TABS
325.0000 mg | ORAL_TABLET | Freq: Four times a day (QID) | ORAL | Status: DC | PRN
Start: 2020-09-07 — End: 2020-09-08

## 2020-09-06 MED ORDER — ENSURE PRE-SURGERY PO LIQD
296.0000 mL | Freq: Once | ORAL | Status: DC
  Filled 2020-09-06: qty 296

## 2020-09-06 MED ORDER — BUPIVACAINE HCL (PF) 0.5 % IJ SOLN
INTRAMUSCULAR | Status: DC | PRN
  Administered 2020-09-06: 3 mL

## 2020-09-06 MED ORDER — FENTANYL CITRATE (PF) 100 MCG/2ML IJ SOLN
25.0000 ug | INTRAMUSCULAR | Status: DC | PRN
Start: 2020-09-06 — End: 2020-09-06

## 2020-09-06 MED ORDER — SODIUM CHLORIDE 0.9 % IV SOLN
INTRAVENOUS | Status: DC | PRN
  Administered 2020-09-06: 50 ug/min via INTRAVENOUS

## 2020-09-06 MED ORDER — CELECOXIB 200 MG PO CAPS
200.0000 mg | ORAL_CAPSULE | Freq: Two times a day (BID) | ORAL | Status: DC
  Administered 2020-09-06 – 2020-09-07 (×2): 200 mg via ORAL
  Filled 2020-09-06 (×2): qty 1

## 2020-09-06 MED ORDER — ROSUVASTATIN CALCIUM 10 MG PO TABS
10.0000 mg | ORAL_TABLET | Freq: Every day | ORAL | Status: DC
  Administered 2020-09-06: 10 mg via ORAL
  Filled 2020-09-06: qty 1

## 2020-09-06 MED ORDER — FENTANYL CITRATE (PF) 100 MCG/2ML IJ SOLN
INTRAMUSCULAR | Status: AC
  Filled 2020-09-06: qty 2

## 2020-09-06 MED ORDER — MAGNESIUM HYDROXIDE 400 MG/5ML PO SUSP
30.0000 mL | Freq: Every day | ORAL | Status: DC
  Administered 2020-09-06 – 2020-09-07 (×2): 30 mL via ORAL
  Filled 2020-09-06 (×2): qty 30

## 2020-09-06 MED ORDER — TRANEXAMIC ACID-NACL 1000-0.7 MG/100ML-% IV SOLN
1000.0000 mg | INTRAVENOUS | Status: AC
  Administered 2020-09-06: 1000 mg via INTRAVENOUS

## 2020-09-06 MED ORDER — DOXAZOSIN MESYLATE 4 MG PO TABS
8.0000 mg | ORAL_TABLET | Freq: Every day | ORAL | Status: DC
  Administered 2020-09-06: 8 mg via ORAL
  Filled 2020-09-06 (×2): qty 1
  Filled 2020-09-06 (×2): qty 2

## 2020-09-06 MED ORDER — ACETAMINOPHEN 10 MG/ML IV SOLN
INTRAVENOUS | Status: DC | PRN
  Administered 2020-09-06: 1000 mg via INTRAVENOUS

## 2020-09-06 MED ORDER — CHLORHEXIDINE GLUCONATE 0.12 % MT SOLN: 15.0000 mL | Freq: Once | OROMUCOSAL | Status: AC

## 2020-09-06 MED ORDER — ONDANSETRON HCL 4 MG/2ML IJ SOLN: 4.0000 mg | Freq: Once | INTRAMUSCULAR | Status: DC | PRN

## 2020-09-06 MED ORDER — BUPIVACAINE LIPOSOME 1.3 % IJ SUSP
INTRAMUSCULAR | Status: AC
  Filled 2020-09-06: qty 20

## 2020-09-06 MED ORDER — CEFAZOLIN SODIUM-DEXTROSE 2-4 GM/100ML-% IV SOLN
INTRAVENOUS | Status: AC
  Filled 2020-09-06: qty 100

## 2020-09-06 MED ORDER — FLEET ENEMA 7-19 GM/118ML RE ENEM: 1.0000 | ENEMA | Freq: Once | RECTAL | Status: DC | PRN

## 2020-09-06 MED ORDER — CHLORHEXIDINE GLUCONATE 0.12 % MT SOLN
OROMUCOSAL | Status: AC
  Administered 2020-09-06: 15 mL via OROMUCOSAL
  Filled 2020-09-06: qty 15

## 2020-09-06 MED ORDER — SODIUM CHLORIDE FLUSH 0.9 % IV SOLN
INTRAVENOUS | Status: AC
  Filled 2020-09-06: qty 40

## 2020-09-06 MED ORDER — TOPIRAMATE 25 MG PO TABS
50.0000 mg | ORAL_TABLET | Freq: Two times a day (BID) | ORAL | Status: DC
  Administered 2020-09-07: 50 mg via ORAL
  Filled 2020-09-06 (×3): qty 2

## 2020-09-06 MED ORDER — OXYCODONE HCL 5 MG PO TABS: 5.0000 mg | ORAL_TABLET | ORAL | Status: DC | PRN

## 2020-09-06 MED ORDER — DEXAMETHASONE SODIUM PHOSPHATE 10 MG/ML IJ SOLN
INTRAMUSCULAR | Status: AC
  Administered 2020-09-06: 8 mg via INTRAVENOUS
  Filled 2020-09-06: qty 1

## 2020-09-06 MED ORDER — FAMOTIDINE 20 MG PO TABS: 20.0000 mg | ORAL_TABLET | Freq: Once | ORAL | Status: AC

## 2020-09-06 MED ORDER — LEVOTHYROXINE SODIUM 50 MCG PO TABS
75.0000 ug | ORAL_TABLET | Freq: Every day | ORAL | Status: DC
  Administered 2020-09-07: 75 ug via ORAL
  Filled 2020-09-06: qty 1

## 2020-09-06 MED ORDER — SODIUM CHLORIDE 0.9 % IV SOLN: INTRAVENOUS | Status: DC

## 2020-09-06 MED ORDER — ACETAMINOPHEN 500 MG PO TABS
1000.0000 mg | ORAL_TABLET | Freq: Four times a day (QID) | ORAL | Status: AC
  Administered 2020-09-06 – 2020-09-07 (×4): 1000 mg via ORAL
  Filled 2020-09-06 (×4): qty 2

## 2020-09-06 MED ORDER — SURGIPHOR WOUND IRRIGATION SYSTEM - OPTIME
TOPICAL | Status: DC | PRN
  Administered 2020-09-06: 1 via TOPICAL

## 2020-09-06 MED ORDER — TRANEXAMIC ACID-NACL 1000-0.7 MG/100ML-% IV SOLN
INTRAVENOUS | Status: AC
  Filled 2020-09-06: qty 100

## 2020-09-06 MED ORDER — FUROSEMIDE 20 MG PO TABS
20.0000 mg | ORAL_TABLET | Freq: Every day | ORAL | Status: DC
  Administered 2020-09-06 – 2020-09-07 (×2): 20 mg via ORAL
  Filled 2020-09-06 (×2): qty 1

## 2020-09-06 MED ORDER — DIPHENHYDRAMINE HCL 25 MG PO CAPS: 50.0000 mg | ORAL_CAPSULE | Freq: Every evening | ORAL | Status: DC | PRN

## 2020-09-06 MED ORDER — ALUM & MAG HYDROXIDE-SIMETH 200-200-20 MG/5ML PO SUSP: 30.0000 mL | ORAL | Status: DC | PRN

## 2020-09-06 MED ORDER — HYDROMORPHONE HCL 1 MG/ML IJ SOLN: 0.5000 mg | INTRAMUSCULAR | Status: DC | PRN

## 2020-09-06 MED ORDER — DIPHENHYDRAMINE HCL 12.5 MG/5ML PO ELIX: 12.5000 mg | ORAL_SOLUTION | ORAL | Status: DC | PRN

## 2020-09-06 MED ORDER — METOCLOPRAMIDE HCL 10 MG PO TABS
10.0000 mg | ORAL_TABLET | Freq: Three times a day (TID) | ORAL | Status: DC
  Administered 2020-09-06 – 2020-09-07 (×3): 10 mg via ORAL
  Filled 2020-09-06 (×3): qty 1

## 2020-09-06 MED ORDER — PROPOFOL 10 MG/ML IV BOLUS
INTRAVENOUS | Status: DC | PRN
  Administered 2020-09-06 (×3): 10 mg via INTRAVENOUS
  Administered 2020-09-06: 20 mg via INTRAVENOUS
  Administered 2020-09-06: 10 mg via INTRAVENOUS
  Administered 2020-09-06: 20 mg via INTRAVENOUS
  Administered 2020-09-06: 10 mg via INTRAVENOUS

## 2020-09-06 MED ORDER — PROPOFOL 1000 MG/100ML IV EMUL
INTRAVENOUS | Status: AC
  Filled 2020-09-06: qty 100

## 2020-09-06 MED ORDER — CHLORHEXIDINE GLUCONATE 4 % EX LIQD
60.0000 mL | Freq: Once | CUTANEOUS | Status: AC
  Administered 2020-09-06: 4 via TOPICAL

## 2020-09-06 MED ORDER — ONDANSETRON HCL 4 MG/2ML IJ SOLN: 4.0000 mg | Freq: Four times a day (QID) | INTRAMUSCULAR | Status: DC | PRN

## 2020-09-06 MED ORDER — FAMOTIDINE 20 MG PO TABS
ORAL_TABLET | ORAL | Status: AC
  Administered 2020-09-06: 20 mg via ORAL
  Filled 2020-09-06: qty 1

## 2020-09-06 MED ORDER — QUINAPRIL HCL 10 MG PO TABS
20.0000 mg | ORAL_TABLET | Freq: Every day | ORAL | Status: DC
  Administered 2020-09-06: 20 mg via ORAL
  Filled 2020-09-06 (×2): qty 2

## 2020-09-06 MED ORDER — ORAL CARE MOUTH RINSE: 15.0000 mL | Freq: Once | OROMUCOSAL | Status: AC

## 2020-09-06 MED ORDER — TRANEXAMIC ACID-NACL 1000-0.7 MG/100ML-% IV SOLN: 1000.0000 mg | Freq: Once | INTRAVENOUS | Status: AC

## 2020-09-06 MED ORDER — PHENOL 1.4 % MT LIQD
1.0000 | OROMUCOSAL | Status: DC | PRN
  Filled 2020-09-06: qty 177

## 2020-09-06 MED ORDER — ACETAMINOPHEN 10 MG/ML IV SOLN: 1000.0000 mg | Freq: Four times a day (QID) | INTRAVENOUS | Status: DC

## 2020-09-06 MED ORDER — BUPIVACAINE HCL (PF) 0.25 % IJ SOLN
INTRAMUSCULAR | Status: AC
  Filled 2020-09-06: qty 60

## 2020-09-06 MED ORDER — DEXMEDETOMIDINE (PRECEDEX) IN NS 20 MCG/5ML (4 MCG/ML) IV SYRINGE
PREFILLED_SYRINGE | INTRAVENOUS | Status: DC | PRN
  Administered 2020-09-06: 20 ug via INTRAVENOUS

## 2020-09-06 MED ORDER — PROPOFOL 500 MG/50ML IV EMUL
INTRAVENOUS | Status: DC | PRN
  Administered 2020-09-06: 100 ug/kg/min via INTRAVENOUS

## 2020-09-06 MED ORDER — SODIUM CHLORIDE 0.9 % IV SOLN
INTRAVENOUS | Status: DC | PRN
  Administered 2020-09-06: 60 mL

## 2020-09-06 MED ORDER — PANTOPRAZOLE SODIUM 40 MG PO TBEC
40.0000 mg | DELAYED_RELEASE_TABLET | Freq: Two times a day (BID) | ORAL | Status: DC
  Administered 2020-09-06 – 2020-09-07 (×2): 40 mg via ORAL
  Filled 2020-09-06 (×2): qty 1

## 2020-09-06 MED ORDER — SODIUM CHLORIDE 0.9 % IR SOLN
Status: DC | PRN
  Administered 2020-09-06: 1000 mL

## 2020-09-06 MED ORDER — DIPHENHYDRAMINE-APAP (SLEEP) 25-500 MG PO TABS: 2.0000 | ORAL_TABLET | Freq: Every day | ORAL | Status: DC

## 2020-09-06 MED ORDER — FERROUS SULFATE 325 (65 FE) MG PO TABS
325.0000 mg | ORAL_TABLET | Freq: Two times a day (BID) | ORAL | Status: DC
  Administered 2020-09-07: 325 mg via ORAL
  Filled 2020-09-06: qty 1

## 2020-09-06 MED ORDER — BISACODYL 10 MG RE SUPP: 10.0000 mg | Freq: Every day | RECTAL | Status: DC | PRN

## 2020-09-06 MED ORDER — BUPIVACAINE HCL (PF) 0.25 % IJ SOLN
INTRAMUSCULAR | Status: DC | PRN
  Administered 2020-09-06: 60 mL

## 2020-09-06 MED ORDER — CELECOXIB 200 MG PO CAPS
ORAL_CAPSULE | ORAL | Status: AC
  Administered 2020-09-06: 400 mg via ORAL
  Filled 2020-09-06: qty 2

## 2020-09-06 MED ORDER — TRANEXAMIC ACID-NACL 1000-0.7 MG/100ML-% IV SOLN
INTRAVENOUS | Status: AC
  Administered 2020-09-06: 1000 mg via INTRAVENOUS
  Filled 2020-09-06: qty 100

## 2020-09-06 MED ORDER — CELECOXIB 200 MG PO CAPS: 400.0000 mg | ORAL_CAPSULE | Freq: Once | ORAL | Status: AC

## 2020-09-06 SURGICAL SUPPLY — 76 items
BATTERY INSTRU NAVIGATION (MISCELLANEOUS) ×8 IMPLANT
BLADE SAW 70X12.5 (BLADE) ×2 IMPLANT
BLADE SAW 90X13X1.19 OSCILLAT (BLADE) ×2 IMPLANT
BLADE SAW 90X25X1.19 OSCILLAT (BLADE) ×2 IMPLANT
BONE CEMENT GENTAMICIN (Cement) ×4 IMPLANT
BTRY SRG DRVR LF (MISCELLANEOUS) ×4
CANISTER SUCT 3000ML PPV (MISCELLANEOUS) ×2 IMPLANT
CEMENT BONE GENTAMICIN 40 (Cement) IMPLANT
CEMENT TIBIA MBT SIZE 5 (Knees) IMPLANT
COOLER POLAR GLACIER W/PUMP (MISCELLANEOUS) ×2 IMPLANT
COVER WAND RF STERILE (DRAPES) ×2 IMPLANT
CUFF TOURN SGL QUICK 24 (TOURNIQUET CUFF)
CUFF TOURN SGL QUICK 30 (TOURNIQUET CUFF)
CUFF TRNQT CYL 24X4X16.5-23 (TOURNIQUET CUFF) IMPLANT
CUFF TRNQT CYL 30X4X21-28X (TOURNIQUET CUFF) IMPLANT
DRAPE 3/4 80X56 (DRAPES) ×2 IMPLANT
DRESSING PEEL AND PLAC PRVNA20 (GAUZE/BANDAGES/DRESSINGS) ×1 IMPLANT
DRSG DERMACEA 8X12 NADH (GAUZE/BANDAGES/DRESSINGS) ×2 IMPLANT
DRSG MEPILEX SACRM 8.7X9.8 (GAUZE/BANDAGES/DRESSINGS) ×2 IMPLANT
DRSG OPSITE POSTOP 4X14 (GAUZE/BANDAGES/DRESSINGS) ×2 IMPLANT
DRSG PEEL AND PLACE PREVENA 20 (GAUZE/BANDAGES/DRESSINGS) ×2
DRSG TEGADERM 4X4.75 (GAUZE/BANDAGES/DRESSINGS) ×2 IMPLANT
DURAPREP 26ML APPLICATOR (WOUND CARE) ×4 IMPLANT
ELECT CAUTERY BLADE 6.4 (BLADE) ×2 IMPLANT
ELECT REM PT RETURN 9FT ADLT (ELECTROSURGICAL) ×2
ELECTRODE REM PT RTRN 9FT ADLT (ELECTROSURGICAL) ×1 IMPLANT
EX-PIN ORTHOLOCK NAV 4X150 (PIN) ×4 IMPLANT
FEMUR SIGMA PS SZ 4.0 R (Femur) ×1 IMPLANT
GLOVE SURG ENC MOIS LTX SZ7.5 (GLOVE) ×4 IMPLANT
GLOVE SURG ENC TEXT LTX SZ7.5 (GLOVE) ×4 IMPLANT
GLOVE SURG UNDER LTX SZ8 (GLOVE) ×2 IMPLANT
GLOVE SURG UNDER POLY LF SZ7.5 (GLOVE) ×2 IMPLANT
GOWN STRL REUS W/ TWL LRG LVL3 (GOWN DISPOSABLE) ×2 IMPLANT
GOWN STRL REUS W/ TWL XL LVL3 (GOWN DISPOSABLE) ×1 IMPLANT
GOWN STRL REUS W/TWL LRG LVL3 (GOWN DISPOSABLE) ×4
GOWN STRL REUS W/TWL XL LVL3 (GOWN DISPOSABLE) ×2
HEMOVAC 400CC 10FR (MISCELLANEOUS) ×2 IMPLANT
HOLDER FOLEY CATH W/STRAP (MISCELLANEOUS) ×2 IMPLANT
HOOD PEEL AWAY FLYTE STAYCOOL (MISCELLANEOUS) ×4 IMPLANT
IRRIGATION SURGIPHOR STRL (IV SOLUTION) ×2 IMPLANT
IV NS IRRIG 3000ML ARTHROMATIC (IV SOLUTION) ×2 IMPLANT
KIT TURNOVER KIT A (KITS) ×2 IMPLANT
KNIFE SCULPS 14X20 (INSTRUMENTS) ×2 IMPLANT
LABEL OR SOLS (LABEL) ×2 IMPLANT
MANIFOLD NEPTUNE II (INSTRUMENTS) ×4 IMPLANT
NDL SAFETY ECLIPSE 18X1.5 (NEEDLE) ×1 IMPLANT
NDL SPNL 20GX3.5 QUINCKE YW (NEEDLE) ×2 IMPLANT
NEEDLE HYPO 18GX1.5 SHARP (NEEDLE) ×2
NEEDLE SPNL 20GX3.5 QUINCKE YW (NEEDLE) ×4 IMPLANT
NS IRRIG 500ML POUR BTL (IV SOLUTION) ×2 IMPLANT
PACK TOTAL KNEE (MISCELLANEOUS) ×2 IMPLANT
PAD WRAPON POLAR KNEE (MISCELLANEOUS) ×1 IMPLANT
PATELLA DOME PFC 38MM (Knees) ×1 IMPLANT
PENCIL SMOKE EVACUATOR COATED (MISCELLANEOUS) ×2 IMPLANT
PIN FIXATION 1/8DIA X 3INL (PIN) ×6 IMPLANT
PLATE ROT INSERT 10MM SIZE 4 (Plate) ×1 IMPLANT
PULSAVAC PLUS IRRIG FAN TIP (DISPOSABLE) ×2
SOL PREP PVP 2OZ (MISCELLANEOUS) ×2
SOLUTION PREP PVP 2OZ (MISCELLANEOUS) ×1 IMPLANT
SPONGE DRAIN TRACH 4X4 STRL 2S (GAUZE/BANDAGES/DRESSINGS) ×2 IMPLANT
STAPLER SKIN PROX 35W (STAPLE) ×2 IMPLANT
STOCKINETTE IMPERV 14X48 (MISCELLANEOUS) IMPLANT
STRAP TIBIA SHORT (MISCELLANEOUS) ×2 IMPLANT
SUCTION FRAZIER HANDLE 10FR (MISCELLANEOUS) ×2
SUCTION TUBE FRAZIER 10FR DISP (MISCELLANEOUS) ×1 IMPLANT
SUT VIC AB 0 CT1 36 (SUTURE) ×4 IMPLANT
SUT VIC AB 1 CT1 36 (SUTURE) ×4 IMPLANT
SUT VIC AB 2-0 CT2 27 (SUTURE) ×2 IMPLANT
SYR 20ML LL LF (SYRINGE) ×2 IMPLANT
SYR 30ML LL (SYRINGE) ×4 IMPLANT
TIBIA MBT CEMENT SIZE 5 (Knees) ×2 IMPLANT
TIP FAN IRRIG PULSAVAC PLUS (DISPOSABLE) ×1 IMPLANT
TOWEL OR 17X26 4PK STRL BLUE (TOWEL DISPOSABLE) ×2 IMPLANT
TOWER CARTRIDGE SMART MIX (DISPOSABLE) ×2 IMPLANT
TRAY FOLEY MTR SLVR 16FR STAT (SET/KITS/TRAYS/PACK) ×2 IMPLANT
WRAPON POLAR PAD KNEE (MISCELLANEOUS) ×2

## 2020-09-06 NOTE — Anesthesia Procedure Notes (Addendum)
Spinal  Patient location during procedure: OR Start time: 09/06/2020 11:50 AM End time: 09/06/2020 12:02 PM Reason for block: surgical anesthesia Staffing Performed: resident/CRNA  Anesthesiologist: Molli Barrows, MD Resident/CRNA: Nelda Marseille, CRNA Preanesthetic Checklist Completed: patient identified, IV checked, site marked, risks and benefits discussed, surgical consent, monitors and equipment checked, pre-op evaluation and timeout performed Spinal Block Patient position: sitting Prep: Betadine Patient monitoring: heart rate, continuous pulse ox, blood pressure and cardiac monitor Approach: midline Location: L3-4 Injection technique: single-shot Needle Needle type: Quincke  Needle gauge: 22 G Needle length: 9 cm Assessment Sensory level: T10 Events: CSF return Additional Notes Negative paresthesia. Negative blood return. Positive free-flowing CSF. Expiration date of kit checked and confirmed. Patient tolerated procedure well, without complications.

## 2020-09-06 NOTE — Anesthesia Preprocedure Evaluation (Signed)
Anesthesia Evaluation  Patient identified by MRN, date of birth, ID band Patient awake    Reviewed: Allergy & Precautions, H&P , NPO status , Patient's Chart, lab work & pertinent test results, reviewed documented beta blocker date and time   History of Anesthesia Complications (+) history of anesthetic complications  Airway Mallampati: III   Neck ROM: full    Dental  (+) Poor Dentition   Pulmonary sleep apnea and Continuous Positive Airway Pressure Ventilation , COPD,  COPD inhaler, former smoker,    Pulmonary exam normal        Cardiovascular Exercise Tolerance: Poor hypertension, + angina with exertion + CAD  Normal cardiovascular exam+ dysrhythmias + Valvular Problems/Murmurs  Rhythm:regular Rate:Normal     Neuro/Psych  Neuromuscular disease negative psych ROS   GI/Hepatic Neg liver ROS, hiatal hernia, PUD, GERD  Medicated,  Endo/Other  diabetesHypothyroidism   Renal/GU negative Renal ROS  negative genitourinary   Musculoskeletal   Abdominal   Peds  Hematology  (+) Blood dyscrasia, anemia ,   Anesthesia Other Findings Past Medical History: No date: Actinic keratosis No date: Anginal pain ([place]) No date: Aortic atherosclerosis (HCC) No date: Arthritis No date: Atrial fibrillation ([place]) 02/06/2010: Basal cell carcinoma     Comment:  Left shoulder. Superficial. 10/11/2013: Basal cell carcinoma     Comment:  Right medial forearm. Superficial 04/10/2015: Basal cell carcinoma     Comment:  Right inf. lat. thigh. Superficial 12/09/2016: Basal cell carcinoma     Comment:  Right cheek. Superficial. No date: Chronic airway obstruction, not elsewhere classified No date: Colon polyps No date: Complication of anesthesia     Comment:  SPINAL DID NOT WORK FOR LAST KNEE REPLACEMENT AND HAD TO              HAVE GA No date: Coronary atherosclerosis of native coronary artery     Comment:  nonobstructive No date:  Diabetes mellitus without complication (HCC) No date: Essential hypertension, benign No date: GERD (gastroesophageal reflux disease)     Comment:  H/O No date: Glucose intolerance (impaired glucose tolerance) No date: Heart murmur No date: History of kidney stones No date: Hyperlipidemia No date: Hypothyroidism No date: Iron deficiency anemia No date: Knee pain, right No date: LBBB (left bundle branch block) No date: Occlusion and stenosis of carotid artery without mention of  cerebral infarction     Comment:  wears compression stockings No date: Other dyspnea and respiratory abnormality     Comment:  w/ pseuodowheeze resolves with purse lip manuever No date: Precordial pain No date: Shoulder pain, right 10/06/2007: Squamous cell carcinoma of skin     Comment:  Right forearm. SCCis 10/11/2013: Squamous cell carcinoma of skin     Comment:  Left mid lat. pretibial. KA-like pattern. No date: Tremor No date: Unspecified sleep apnea     Comment:  HAS NOT USED CPAP IN 20 YEARS No date: Venous insufficiency No date: Wears dentures     Comment:  full upper Past Surgical History: No date: CARDIAC CATHETERIZATION     Comment:  X2 03/17/12: COLONOSCOPY     Comment:  Dr Ricardo Jericho 11/25/2014: ESOPHAGOGASTRODUODENOSCOPY (EGD) WITH PROPOFOL; N/A     Comment:  Procedure: ESOPHAGOGASTRODUODENOSCOPY (EGD) WITH               PROPOFOL;  Surgeon: Lucilla Lame, MD;  Location: [facility];  Service: Endoscopy;  Laterality: N/A;  with biopsy 01/05/2015: ESOPHAGOGASTRODUODENOSCOPY (EGD) WITH PROPOFOL; N/A     Comment:  Procedure: ESOPHAGOGASTRODUODENOSCOPY (EGD) WITH               PROPOFOL;  Surgeon: Lucilla Lame, MD;  Location: [facility];  Service: Endoscopy;  Laterality: N/A; No date: EYE SURGERY; Bilateral     Comment:  cataract  2012: TOTAL KNEE ARTHROPLASTY; Left BMI    Body Mass Index: 33.06 kg/m      Reproductive/Obstetrics negative OB ROS                             Anesthesia Physical Anesthesia Plan  ASA: III  Anesthesia Plan: Spinal   Post-op Pain Management:    Induction:   PONV Risk Score and Plan:   Airway Management Planned:   Additional Equipment:   Intra-op Plan:   Post-operative Plan:   Informed Consent: I have reviewed the patients History and Physical, chart, labs and discussed the procedure including the risks, benefits and alternatives for the proposed anesthesia with the patient or authorized representative who has indicated his/her understanding and acceptance.     Dental Advisory Given  Plan Discussed with: CRNA  Anesthesia Plan Comments:         Anesthesia Quick Evaluation

## 2020-09-06 NOTE — Op Note (Signed)
OPERATIVE NOTE  DATE OF SURGERY:  09/06/2020  PATIENT NAME:  Jared Rice   DOB: 1932-10-11  MRN: [id number]  PRE-OPERATIVE DIAGNOSIS: Degenerative arthrosis of the right knee, primary  POST-OPERATIVE DIAGNOSIS:  Same  PROCEDURE:  Right total knee arthroplasty using computer-assisted navigation  SURGEON:  Marciano Sequin. M.D.  ASSISTANT: Cassell Smiles, PA-C (present and scrubbed throughout the case, critical for assistance with exposure, retraction, instrumentation, and closure)  ANESTHESIA: spinal  ESTIMATED BLOOD LOSS: 50 mL  FLUIDS REPLACED: 1200 mL of crystalloid  TOURNIQUET TIME: 107 minutes  DRAINS: 2 medium Hemovac drains  SOFT TISSUE RELEASES: Anterior cruciate ligament, posterior cruciate ligament, deep medial collateral ligament, patellofemoral ligament  IMPLANTS UTILIZED: DePuy PFC Sigma size 4 posterior stabilized femoral component (cemented), size 8 MBT tibial component (cemented), 38 mm 3 peg oval dome patella (cemented), and a 10 mm stabilized rotating platform polyethylene insert.  INDICATIONS FOR SURGERY: Jared Rice is a 87 y.o. year old male with a long history of progressive knee pain. X-rays demonstrated severe degenerative changes in tricompartmental fashion. The patient had not seen any significant improvement despite conservative nonsurgical intervention. After discussion of the risks and benefits of surgical intervention, the patient expressed understanding of the risks benefits and agree with plans for total knee arthroplasty.   The risks, benefits, and alternatives were discussed at length including but not limited to the risks of infection, bleeding, nerve injury, stiffness, blood clots, the need for revision surgery, cardiopulmonary complications, among others, and they were willing to proceed.  PROCEDURE IN DETAIL: The patient was brought into the operating room and, after adequate spinal anesthesia was achieved, a tourniquet was placed on the  patient's upper thigh. The patient's knee and leg were cleaned and prepped with alcohol and DuraPrep and draped in the usual sterile fashion. A "timeout" was performed as per usual protocol. The lower extremity was exsanguinated using an Esmarch, and the tourniquet was inflated to 300 mmHg. An anterior longitudinal incision was made followed by a standard mid vastus approach. The deep fibers of the medial collateral ligament were elevated in a subperiosteal fashion off of the medial flare of the tibia so as to maintain a continuous soft tissue sleeve. The patella was subluxed laterally and the patellofemoral ligament was incised. Inspection of the knee demonstrated severe degenerative changes with full-thickness loss of articular cartilage. Osteophytes were debrided using a rongeur. Anterior and posterior cruciate ligaments were excised. Two 4.0 mm Schanz pins were inserted in the femur and into the tibia for attachment of the array of trackers used for computer-assisted navigation. Hip center was identified using a circumduction technique. Distal landmarks were mapped using the computer. The distal femur and proximal tibia were mapped using the computer. The distal femoral cutting guide was positioned using computer-assisted navigation so as to achieve a 5 distal valgus cut. The femur was sized and it was felt that a size 4 femoral component was appropriate. A size 4 femoral cutting guide was positioned and the anterior cut was performed and verified using the computer. This was followed by completion of the posterior and chamfer cuts. Femoral cutting guide for the central box was then positioned in the center box cut was performed.  Attention was then directed to the proximal tibia. Medial and lateral menisci were excised. The extramedullary tibial cutting guide was positioned using computer-assisted navigation so as to achieve a 0 varus-valgus alignment and 0 posterior slope. The cut was performed and  verified using the computer. The proximal  tibia was sized and it was felt that a size 5 tibial tray was appropriate. Tibial and femoral trials were inserted followed by insertion of a 10 mm polyethylene insert.  The knee was felt to be tight both in flexion and extension.  Trial components were removed and the extra medullary tibial cutting guide was repositioned so as to resect an additional 2 mm of bone.  Cut was performed verified using computer.  Trial components were reinserted with a 10 mm polyethylene trial.  This allowed for excellent mediolateral soft tissue balancing both in flexion and in full extension. Finally, the patella was cut and prepared so as to accommodate a 38 mm 3 peg oval dome patella. A patella trial was placed and the knee was placed through a range of motion with excellent patellar tracking appreciated. The femoral trial was removed after debridement of posterior osteophytes. The central post-hole for the tibial component was reamed followed by insertion of a keel punch. Tibial trials were then removed. Cut surfaces of bone were irrigated with copious amounts of normal saline using pulsatile lavage and then suctioned dry. Polymethylmethacrylate cement with gentamicin was prepared in the usual fashion using a vacuum mixer. Cement was applied to the cut surface of the proximal tibia as well as along the undersurface of a size 5 MBT tibial component. Tibial component was positioned and impacted into place. Excess cement was removed using [employer] fast streamer. Cement was then applied to the cut surfaces of the femur as well as along the posterior flanges of the size 4 femoral component. The femoral component was positioned and impacted into place. Excess cement was removed using [employer] fast streamer. A 10 mm polyethylene trial was inserted and the knee was brought into full extension with steady axial compression applied. Finally, cement was applied to the backside of a 38 mm 3 peg oval dome patella and  the patellar component was positioned and patellar clamp applied. Excess cement was removed using [employer] fast streamer. After adequate curing of the cement, the tourniquet was deflated after a total tourniquet time of 107 minutes. Hemostasis was achieved using electrocautery. The knee was irrigated with copious amounts of normal saline using pulsatile lavage followed by 500 ml of Surgiphor and then suctioned dry. 20 mL of 1.3% Exparel and 60 mL of 0.25% Marcaine in 40 mL of normal saline was injected along the posterior capsule, medial and lateral gutters, and along the arthrotomy site. A 10 mm stabilized rotating platform polyethylene insert was inserted and the knee was placed through a range of motion with excellent mediolateral soft tissue balancing appreciated and excellent patellar tracking noted. 2 medium drains were placed in the wound bed and brought out through separate stab incisions. The medial parapatellar portion of the incision was reapproximated using interrupted sutures of #1 Vicryl. Subcutaneous tissue was approximated in layers using first #0 Vicryl followed #2-0 Vicryl. The skin was approximated with skin staples. A sterile dressing was applied.  The patient tolerated the procedure well and was transported to the recovery room in stable condition.    Deitrich P. Holley Bouche., M.D.

## 2020-09-06 NOTE — Anesthesia Procedure Notes (Signed)
Date/Time: 09/06/2020 12:00 PM Performed by: Nelda Marseille, CRNA Pre-anesthesia Checklist: Patient identified, Patient being monitored, Timeout performed, Emergency Drugs available and Suction available Patient Re-evaluated:Patient Re-evaluated prior to induction Oxygen Delivery Method: Simple face mask Preoxygenation: Pre-oxygenation with 100% oxygen Induction Type: IV induction Ventilation: Mask ventilation without difficulty Placement Confirmation: breath sounds checked- equal and bilateral

## 2020-09-06 NOTE — H&P (Signed)
The patient has been re-examined, and the chart reviewed, and there have been no interval changes to the documented history and physical.    The risks, benefits, and alternatives have been discussed at length. The patient expressed understanding of the risks benefits and agreed with plans for surgical intervention.  Oden P. Britzy Graul, Jr. M.D.    

## 2020-09-06 NOTE — Transfer of Care (Signed)
Immediate Anesthesia Transfer of Care Note  Patient: Jared Rice  Procedure(s) Performed: COMPUTER ASSISTED TOTAL KNEE ARTHROPLASTY (Right Knee)  Patient Location: PACU  Anesthesia Type:Spinal  Level of Consciousness: awake and sedated  Airway & Oxygen Therapy: Patient Spontanous Breathing and Patient connected to face mask oxygen  Post-op Assessment: Report given to RN and Post -op Vital signs reviewed and stable  Post vital signs: Reviewed and stable  Last Vitals:  Vitals Value Taken Time  BP 117/74 09/06/20 1554  Temp    Pulse 61 09/06/20 1557  Resp 20 09/06/20 1557  SpO2 99 % 09/06/20 1557  Vitals shown include unvalidated device data.  Last Pain:  Vitals:   09/06/20 1011  TempSrc: Oral  PainSc: 1          Complications: No complications documented.

## 2020-09-06 NOTE — Plan of Care (Signed)

## 2020-09-07 ENCOUNTER — Encounter: Payer: Self-pay | Admitting: Orthopedic Surgery

## 2020-09-07 DIAGNOSIS — M1711 Unilateral primary osteoarthritis, right knee: Secondary | ICD-10-CM | POA: Diagnosis not present

## 2020-09-07 MED ORDER — CELECOXIB 200 MG PO CAPS: 200.0000 mg | ORAL_CAPSULE | Freq: Two times a day (BID) | ORAL | 0 refills | Status: DC

## 2020-09-07 MED ORDER — TRAMADOL HCL 50 MG PO TABS: 50.0000 mg | ORAL_TABLET | Freq: Four times a day (QID) | ORAL | 0 refills | Status: DC | PRN

## 2020-09-07 MED ORDER — OXYCODONE HCL 5 MG PO TABS: 5.0000 mg | ORAL_TABLET | ORAL | 0 refills | Status: DC | PRN

## 2020-09-07 MED ORDER — ONDANSETRON HCL 4 MG PO TABS: 4.0000 mg | ORAL_TABLET | Freq: Four times a day (QID) | ORAL | 0 refills | Status: DC | PRN

## 2020-09-07 NOTE — Progress Notes (Signed)
Physical Therapy Treatment Patient Details Name: Jared Rice MRN: [id number] DOB: [birth year] Today's Date: 09/07/2020    History of Present Illness Patient is an 87 year old male with degenerative arthrosis of the right knee, s/p right total knee arthroplasty on 09/06/20. Past medical history of left total knee arthroplasty, essential HTN, anemia.    PT Comments    Patient has progressed well with physical therapy with increased independence and activity tolerance this session. Stair training completed this session and patient demonstrated correct technique safely after initial instruction on sequencing. Patient also with increased ambulation distance to 138ft with rolling walker, demonstrating step-through and heel-toe gait pattern.  From a mobility stand point, patient could discharge home today with the assistance from his family with HHPT recommended. Therapist has reviewed home exercise program with the patient. If patient remains in the hospital, PT will continue to see to progress activity and independence.    Follow Up Recommendations  Home health PT;Supervision for mobility/OOB     Equipment Recommendations  None recommended by PT    Recommendations for Other Services       Precautions / Restrictions Precautions Precautions: Knee Restrictions Weight Bearing Restrictions: Yes RLE Weight Bearing: Weight bearing as tolerated    Mobility  Bed Mobility Overal bed mobility: Needs Assistance Bed Mobility: Supine to Sit;Sit to Supine     Supine to sit: Min assist Sit to supine: Min assist   General bed mobility comments: assistance for trunk support to sit upright and assistance for RLE support to return to bed. Of note, patient reports he will not be sleeping in a bed at home as he has used a recliner for sleeping for years at home    Transfers Overall transfer level: Needs assistance Equipment used: Rolling walker (2 wheeled) Transfers: Sit to/from Stand Sit to  Stand: Min guard         General transfer comment: Min guard assistance provided with sit to stand transfers from bed and from recliner. patient demonstrated correct technique with hand placement and RLE positioning with transfers after initial verbal cues.  Ambulation/Gait Ambulation/Gait assistance: Min guard Gait Distance (Feet): 150 Feet Assistive device: Rolling walker (2 wheeled) Gait Pattern/deviations: Step-through pattern;Decreased stance time - right Gait velocity: decreased   General Gait Details: patient demonstrated step through gait pattern with heel-toe technique with RLE with mild decreased stance time on RLE. Occasional cues and education provided for positioning of rolling walker and safety with turns   Stairs Stairs: Yes Stairs assistance: Min guard Stair Management: Two rails;Step to pattern Number of Stairs: 4 General stair comments: Patient went up and down 4 steps with Min guard for safety after initial instruction for technique.   Wheelchair Mobility    Modified Rankin (Stroke Patients Only)       Balance Overall balance assessment: Needs assistance Sitting-balance support: Feet supported;No upper extremity supported Sitting balance-Leahy Scale: Good     Standing balance support: Bilateral upper extremity supported Standing balance-Leahy Scale: Fair Standing balance comment: Patient using rolling walker for support in standing position                            Cognition Arousal/Alertness: Awake/alert Behavior During Therapy: WFL for tasks assessed/performed Overall Cognitive Status: Within Functional Limits for tasks assessed  Exercises Other Exercises Other Exercises: Educ re: d/c recs, AE for LB dressing/bathing, polar care mgmt, TED hose mgmt, falls prevention    General Comments        Pertinent Vitals/Pain Pain Assessment: No/denies pain Pain Score: 2  Pain  Location: right knee Pain Intervention(s): Limited activity within patient's tolerance;Monitored during session;Repositioned    Home Living Family/patient expects to be discharged to:: Private residence Living Arrangements: Spouse/significant other Available Help at Discharge: Family Type of Home: House Home Access: Stairs to enter Entrance Stairs-Rails: Can reach both Home Layout: Two level;Bed/bath upstairs;1/2 bath on main level Home Equipment: Walker - 2 wheels;Cane - single point;Grab bars - tub/shower;Shower seat - built in;Grab bars - toilet;Hand held shower head      Prior Function Level of Independence: Independent with assistive device(s)      Comments: patient reports he uses rolling walker for mobility recently. No assistance required for ADLs as baseline. Patient drives   PT Goals (current goals can now be found in the care plan section) Acute Rehab PT Goals Patient Stated Goal: to go home later today PT Goal Formulation: With patient Time For Goal Achievement: 09/21/20 Potential to Achieve Goals: Good Progress towards PT goals: Progressing toward goals    Frequency    BID      PT Plan Current plan remains appropriate    Co-evaluation              AM-PAC PT "6 Clicks" Mobility   Outcome Measure  Help needed turning from your back to your side while in a flat bed without using bedrails?: A Little Help needed moving from lying on your back to sitting on the side of a flat bed without using bedrails?: A Little Help needed moving to and from a bed to a chair (including a wheelchair)?: A Little Help needed standing up from a chair using your arms (e.g., wheelchair or bedside chair)?: A Little Help needed to walk in hospital room?: A Little Help needed climbing 3-5 steps with a railing? : A Little 6 Click Score: 18    End of Session Equipment Utilized During Treatment: Gait belt Activity Tolerance: Patient tolerated treatment well Patient left: in  bed;with call bell/phone within reach;with bed alarm set;with SCD's reapplied;with family/visitor present (polar care reapplied to right knee) Nurse Communication: Mobility status PT Visit Diagnosis: Other abnormalities of gait and mobility (R26.89);Pain;Difficulty in walking, not elsewhere classified (R26.2) Pain - Right/Left: Right Pain - part of body: Knee     Time: 1325-1410 PT Time Calculation (min) (ACUTE ONLY): 45 min  Charges:  $Gait Training: 23-37 mins $Therapeutic Activity: 8-22 mins                     Minna Merritts, PT, MPT    Percell Locus 09/07/2020, 2:28 PM

## 2020-09-07 NOTE — Evaluation (Signed)
Occupational Therapy Evaluation Patient Details Name: Jared Rice MRN: [id number] DOB: Mar 28, 1933 Today's Date: 09/07/2020    History of Present Illness Patient is an 87 year old male with degenerative arthrosis of the right knee, s/p right total knee arthroplasty on 09/06/20. Past medical history of left total knee arthroplasty, essential HTN, anemia.   Clinical Impression   Pt seen for OT evaluation this date, POD#1 from above surgery. Pt was independent in all ADLs prior to surgery, however using RW for ambulation due to R knee pain. Pt is eager to return to PLOF with less pain and improved ability to be involved in housekeeping and yard work tasks. Mr. Sedalia Muta describes only mild pain at present (1-3/10) and is able to bear weight on R leg while standing. Pt currently requires Min/Mod A for bed mobility and transfers, MaxA for LB dressing while in seated position due to pain and limited AROM of R knee. Pt and spouse instructed in polar care mgt, falls prevention strategies, home/routines modifications, DME/AE for LB bathing and dressing tasks, and compression stocking mgt. They express understanding and provide teach-back. This is pt's second TKA (L TKA ~ 10 years ago), and pt and wife state they know what to expect and anticipate that they can manage any needs at home. No addition OT services needed at present.    Follow Up Recommendations  No OT follow up    Equipment Recommendations  None recommended by OT    Recommendations for Other Services       Precautions / Restrictions Precautions Precautions: Knee Precaution Booklet Issued: Yes (comment) Required Braces or Orthoses:  (Knee immobilizer not required as patient able to perform SLR x 10 reps independently with RLE) Restrictions Weight Bearing Restrictions: Yes RLE Weight Bearing: Weight bearing as tolerated      Mobility Bed Mobility Overal bed mobility: Needs Assistance Bed Mobility: Supine to Sit     Supine to sit:  Mod assist     General bed mobility comments: Pt able to move RLE without assistance. Extra time required.    Transfers Overall transfer level: Needs assistance Equipment used: Rolling walker (2 wheeled) Transfers: Sit to/from Stand Sit to Stand: Min assist         General transfer comment: MinA for sit>stand with bed in raised position. Pt used rocking motion/momentum to come into standing.    Balance Overall balance assessment: Needs assistance Sitting-balance support: Feet supported;No upper extremity supported Sitting balance-Leahy Scale: Good     Standing balance support: Bilateral upper extremity supported Standing balance-Leahy Scale: Fair Standing balance comment: Pt able to bear weight on RLE w/o pain                           ADL either performed or assessed with clinical judgement   ADL Overall ADL's : Needs assistance/impaired                     Lower Body Dressing: Maximal assistance                       Vision Patient Visual Report: No change from baseline       Perception     Praxis      Pertinent Vitals/Pain Pain Assessment: 0-10 Pain Score: 2  Pain Location: right knee Pain Descriptors / Indicators: Sore Pain Intervention(s): Limited activity within patient's tolerance;Monitored during session;Repositioned     Hand Dominance  Extremity/Trunk Assessment Upper Extremity Assessment Upper Extremity Assessment: Overall WFL for tasks assessed   Lower Extremity Assessment Lower Extremity Assessment: RLE deficits/detail RLE Deficits / Details: patient able to SLR independently. no knee buckling with weight bearing. patient able to activate hip/knee/ankle movement in gravity elimintated position RLE Sensation: WNL       Communication Communication Communication: No difficulties   Cognition Arousal/Alertness: Awake/alert Behavior During Therapy: WFL for tasks assessed/performed Overall Cognitive Status:  Within Functional Limits for tasks assessed                                     General Comments       Exercises Exercises: Total Joint Total Joint Exercises Ankle Circles/Pumps: AROM;Strengthening;Right;20 reps;Seated Quad Sets: AROM;Strengthening;Right;10 reps;Seated Straight Leg Raises: AROM;Strengthening;Right;10 reps;Supine Long Arc Quad: AAROM;Strengthening;Right;10 reps;Seated Goniometric ROM: right knee active knee ROM 3-73 degrees. Other Exercises Other Exercises: Educ re: d/c recs, AE for LB dressing/bathing, polar care mgmt, TED hose mgmt, falls prevention   Shoulder Instructions      Home Living Family/patient expects to be discharged to:: Private residence Living Arrangements: Spouse/significant other Available Help at Discharge: Family Type of Home: House Home Access: Stairs to enter Technical brewer of Steps: 2 Entrance Stairs-Rails: Can reach both Home Layout: Two level;Bed/bath upstairs;1/2 bath on main level Alternate Level Stairs-Number of Steps:  (1 flight) Alternate Level Stairs-Rails: Right;Left;Can reach both Bathroom Shower/Tub: Occupational psychologist: Handicapped height     Home Equipment: Environmental consultant - 2 wheels;Cane - single point;Grab bars - tub/shower;Shower seat - built in;Grab bars - toilet;Hand held shower head          Prior Functioning/Environment Level of Independence: Independent with assistive device(s)        Comments: patient reports he uses rolling walker for mobility recently. No assistance required for ADLs as baseline. Patient drives        OT Problem List: Decreased strength;Decreased range of motion;Decreased activity tolerance;Impaired balance (sitting and/or standing);Decreased knowledge of use of DME or AE      OT Treatment/Interventions:      OT Goals(Current goals can be found in the care plan section) Acute Rehab OT Goals Patient Stated Goal: to go home later today OT Goal Formulation:  With patient/family Time For Goal Achievement: 09/21/20 Potential to Achieve Goals: Good  OT Frequency:     Barriers to D/C:            Co-evaluation              AM-PAC OT "6 Clicks" Daily Activity     Outcome Measure Help from another person eating meals?: None Help from another person taking care of personal grooming?: None Help from another person toileting, which includes using toliet, bedpan, or urinal?: A Lot Help from another person bathing (including washing, rinsing, drying)?: A Lot Help from another person to put on and taking off regular upper body clothing?: None Help from another person to put on and taking off regular lower body clothing?: A Lot 6 Click Score: 18   End of Session Equipment Utilized During Treatment: Rolling walker Nurse Communication: Mobility status  Activity Tolerance: Patient tolerated treatment well;No increased pain Patient left: in chair;with call bell/phone within reach;with chair alarm set;with family/visitor present;with nursing/sitter in room  OT Visit Diagnosis: Unsteadiness on feet (R26.81);Muscle weakness (generalized) (M62.81)  Time: 3532-9924 OT Time Calculation (min): 39 min Charges:  OT General Charges $OT Visit: 1 Visit OT Evaluation $OT Eval Low Complexity: 1 Low OT Treatments $Self Care/Home Management : 38-52 mins  Josiah Lobo, PhD, MS, OTR/L 09/07/20, 11:47 AM

## 2020-09-07 NOTE — Progress Notes (Signed)
Met with the patient and the spouse at the bedside, he lives at home with his wife,  He has a rw at home, he needs a 3 in 1 He is set up with [facility] He can afford his medications and has transportation to the Doctor No additional needs

## 2020-09-07 NOTE — Evaluation (Signed)
Physical Therapy Evaluation Patient Details Name: Jared Rice MRN: [id number] DOB: 02/22/1933 Today's Date: 09/07/2020   History of Present Illness  Patient is an 87 year old male with degenerative arthrosis of the right knee, s/p right total knee arthroplasty on 09/06/20. Past medical history of left total knee arthroplasty, essential HTN, anemia.  Clinical Impression  Patient is eager to start physical therapy and wants to go home later today if possible. Patient is Mod I with use of rolling walker and independent with ADLs at baseline.  During PT evaluation, patient required moderate assistance for bed mobility and Mod A for lifting assistance to stand. Verbal cues for sequencing and technique for increased independence and safety with mobility. No loss of balance in standing position and no dizziness is reported. Patient ambulated 63ft with rolling walker with Min guard assistance and standing activity tolerance limited by fatigue. Unable to complete ambulation around the nursing station at this time. Patient reports 2/10 pain after mobilizing with therapy in right knee.  Of note, patient has an upstairs bedroom and bathroom at home and will need to be able to navigate stairs prior to discharge. Recommend to continue PT to maximize independence in preparation for discharge home with family support. PT will reassess in the afternoon if patient will be safe to discharge home today.     Follow Up Recommendations Home health PT;Supervision for mobility/OOB    Equipment Recommendations  None recommended by PT    Recommendations for Other Services       Precautions / Restrictions Precautions Precautions: Knee Precaution Booklet Issued: Yes (comment) Required Braces or Orthoses:  (Knee immobilizer not required as patient able to perform SLR x 10 reps independently with RLE) Restrictions Weight Bearing Restrictions: Yes RLE Weight Bearing: Weight bearing as tolerated      Mobility  Bed  Mobility Overal bed mobility: Needs Assistance Bed Mobility: Supine to Sit     Supine to sit: Mod assist     General bed mobility comments: assistance for RLE and trunk support to sit up on left side of bed. verbal cues for hand placement and sequencing. extra time required to complete tasks    Transfers Overall transfer level: Needs assistance Equipment used: Rolling walker (2 wheeled) Transfers: Sit to/from Stand Sit to Stand: Mod assist         General transfer comment: Mod A for lifting assistance to stand from bed in lowest height position. Patient uses momentum to facilitate standing. Verbal cues for hand placement and RLE positioning with transfers  Ambulation/Gait Ambulation/Gait assistance: Min guard Gait Distance (Feet): 80 Feet Assistive device: Rolling walker (2 wheeled) Gait Pattern/deviations: Step-to pattern;Antalgic;Decreased step length - right;Decreased stance time - right Gait velocity: decreased   General Gait Details: Occasional verbal cues for rolling walker positioning and technique. No loss of balance noted, Min guard assistance for safety. Overall activity tolerance is limited by fatigue with prolonged standing activity  Stairs            Wheelchair Mobility    Modified Rankin (Stroke Patients Only)       Balance Overall balance assessment: Needs assistance Sitting-balance support: Feet supported;No upper extremity supported Sitting balance-Leahy Scale: Good     Standing balance support: Bilateral upper extremity supported Standing balance-Leahy Scale: Fair Standing balance comment: patient relying heavily on rolling walker for UE support in standing  Pertinent Vitals/Pain Pain Assessment: 0-10 Pain Score: 2  Pain Location: right knee Pain Descriptors / Indicators: Sore Pain Intervention(s): Monitored during session;Premedicated before session;Ice applied (polar care re-applied at end of  session)    [place] expects to be discharged to:: Private residence Living Arrangements: Spouse/significant other Available Help at Discharge: Family Type of Home: House Home Access: Stairs to enter Entrance Stairs-Rails: Can reach both Entrance Stairs-Number of Steps: 2 [place]: Two level;Bed/bath upstairs Home Equipment: [place] - 2 wheels;Cane - single point;Grab bars - tub/shower;Shower seat      Prior Function Level of Independence: Independent with assistive device(s)         Comments: patient reports he uses rolling walker for mobility recently. No assistance required for ADLs as baseline. Patient drives     Hand Dominance        Extremity/Trunk Assessment   Upper Extremity Assessment Upper Extremity Assessment: Overall WFL for tasks assessed    Lower Extremity Assessment Lower Extremity Assessment: RLE deficits/detail (LLE grossly WFL for tasks assessed) RLE Deficits / Details: patient able to SLR independently. no knee buckling with weight bearing. patient able to activate hip/knee/ankle movement in gravity elimintated position RLE Sensation: WNL       Communication   Communication: No difficulties  Cognition Arousal/Alertness: Awake/alert Behavior During Therapy: WFL for tasks assessed/performed Overall Cognitive Status: Within Functional Limits for tasks assessed                                        General Comments      Exercises Total Joint Exercises Ankle Circles/Pumps: AROM;Strengthening;Right;20 reps;Seated Quad Sets: AROM;Strengthening;Right;10 reps;Seated Straight Leg Raises: AROM;Strengthening;Right;10 reps;Supine Long Arc Quad: AAROM;Strengthening;Right;10 reps;Seated Goniometric ROM: right knee active knee ROM 3-73 degrees.   Assessment/Plan    PT Assessment Patient needs continued PT services  PT Problem List Decreased strength;Decreased range of motion;Decreased activity tolerance;Decreased  balance;Decreased mobility;Decreased safety awareness;Decreased knowledge of precautions;Decreased knowledge of use of DME       PT Treatment Interventions DME instruction;Gait training;Functional mobility training;Stair training;Therapeutic activities;Therapeutic exercise;Balance training;Patient/family education    PT Goals (Current goals can be found in the Care Plan section)  Acute Rehab PT Goals Patient Stated Goal: to go home later today PT Goal Formulation: With patient Time For Goal Achievement: 09/21/20 Potential to Achieve Goals: Good    Frequency BID   Barriers to discharge Inaccessible home environment (patient has an upstairs bedroom/bathroom)      Co-evaluation               AM-PAC PT "6 Clicks" Mobility  Outcome Measure Help needed turning from your back to your side while in a flat bed without using bedrails?: A Little Help needed moving from lying on your back to sitting on the side of a flat bed without using bedrails?: A Lot Help needed moving to and from a bed to a chair (including a wheelchair)?: A Lot Help needed standing up from a chair using your arms (e.g., wheelchair or bedside chair)?: A Little Help needed to walk in hospital room?: A Little Help needed climbing 3-5 steps with a railing? : A Little 6 Click Score: 16    End of Session Equipment Utilized During Treatment: Gait belt Activity Tolerance: Patient limited by fatigue Patient left: in chair;with call bell/phone within reach;with chair alarm set;with SCD's reapplied;with family/visitor present (polar care reapplied to right knee, spouse at  the bedside) Nurse Communication: Mobility status PT Visit Diagnosis: Other abnormalities of gait and mobility (R26.89);Pain;Difficulty in walking, not elsewhere classified (R26.2) Pain - Right/Left: Right Pain - part of body: Knee    Time: 0820-0916 PT Time Calculation (min) (ACUTE ONLY): 56 min   Charges:   PT Evaluation $PT Eval Moderate  Complexity: 1 Mod PT Treatments $Gait Training: 8-22 mins $Therapeutic Exercise: 8-22 mins        Minna Merritts, PT, MPT   Percell Locus 09/07/2020, 9:37 AM

## 2020-09-07 NOTE — Plan of Care (Signed)

## 2020-09-07 NOTE — Discharge Summary (Signed)
Physician Discharge Summary  Patient ID: Jared Rice MRN: [id number] DOB/AGE: [birth year] 87 y.o.  Admit date: 09/06/2020 Discharge date: 09/07/2020  Admission Diagnoses:  Total knee replacement status [Z96.659]  Discharge Diagnoses: Patient Active Problem List   Diagnosis Date Noted  . Total knee replacement status 09/06/2020  . Atrial fibrillation ([place]) 05/12/2020  . Urinary frequency 11/22/2019  . Leukocytes in urine 11/22/2019  . Skin tear of forearm without complication, initial encounter- left 11/22/2019  . Wound cellulitis- right lower leg anterior  10/29/2019  . Leg wound, right, sequela 10/29/2019  . Chronic fatigue 11/29/2015  . Benign prostatic hyperplasia 02/22/2015  . Gastrointestinal ulcer   . History of peptic ulcer   . Gastritis   . Hematochezia   . Acute gastric ulcer   . Melena 11/12/2014  . Malignant neoplasm of skin 09/21/2014  . Cataract 09/21/2014  . Colon polyp 09/21/2014  . CAFL (chronic airflow limitation) ([place]) 09/21/2014  . Diabetes ([place]) 09/21/2014  . DD (diverticular disease) 09/21/2014  . Benign essential tremor 09/21/2014  . Personal history of tobacco use, presenting hazards to health 09/21/2014  . Acid reflux 09/21/2014  . Hemorrhoids 09/21/2014  . 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 HLD (hyperlipidemia) 09/21/2014  . BP (high blood pressure) 09/21/2014  . Hypothyroidism 09/21/2014  . Lymphoma ([place]) 09/21/2014  . Malaise and fatigue 09/21/2014  . Mononeuritis 09/21/2014  . Muscle ache 09/21/2014  . Neuropathy 09/21/2014  . Numbness and tingling 09/21/2014  . Arthritis, degenerative 09/21/2014  . Awareness of heartbeats 09/21/2014  . Borderline diabetes 09/21/2014  . Acne erythematosa 09/21/2014  . Rotator cuff syndrome 09/21/2014  . Athlete's foot 09/21/2014  . Has a tremor 09/21/2014  . Stasis, venous 09/21/2014  . Avitaminosis D 09/21/2014  . Arthritis of knee, degenerative 11/16/2013  . Bradycardia, sinus 12/31/2011  .  Left bundle branch block 12/31/2011  . First degree AV block 12/31/2011  . Edema of both legs 12/31/2011  . Bradycardia 12/31/2011  . Localized edema 12/31/2011  . COPD UNSPECIFIED 11/09/2009  . HYPERTENSION, BENIGN 10/03/2009  . SLEEP APNEA 10/03/2009  . DYSPNEA 10/03/2009  . Other forms of dyspnea 10/03/2009  . CAD, NATIVE VESSEL 05/04/2009  . Carotid disease, bilateral ([place]) 05/04/2009  . Disorder of artery or arteriole ([place]) 05/04/2009    Past Medical History:  Diagnosis Date  . Actinic keratosis   . Anginal pain ([place])   . Aortic atherosclerosis ([place])   . Arthritis   . Atrial fibrillation ([place])   . Basal cell carcinoma 02/06/2010   Left shoulder. Superficial.  . Basal cell carcinoma 10/11/2013   Right medial forearm. Superficial  . Basal cell carcinoma 04/10/2015   Right inf. lat. thigh. Superficial  . Basal cell carcinoma 12/09/2016   Right cheek. Superficial.  . Chronic airway obstruction, not elsewhere classified   . Colon polyps   . Complication of anesthesia    SPINAL DID NOT WORK FOR LAST KNEE REPLACEMENT AND HAD TO HAVE GA  . Coronary atherosclerosis of native coronary artery    nonobstructive  . Diabetes mellitus without complication ([place])   . Essential hypertension, benign   . GERD (gastroesophageal reflux disease)    H/O  . Glucose intolerance (impaired glucose tolerance)   . Heart murmur   . History of kidney stones   . Hyperlipidemia   . Hypothyroidism   . Iron deficiency anemia   . Knee pain, right   . LBBB (left bundle branch block)   . Occlusion and stenosis of carotid artery without  mention of cerebral infarction    wears compression stockings  . Other dyspnea and respiratory abnormality    w/ pseuodowheeze resolves with purse lip manuever  . Precordial pain   . Shoulder pain, right   . Squamous cell carcinoma of skin 10/06/2007   Right forearm. SCCis  . Squamous cell carcinoma of skin 10/11/2013   Left mid lat. pretibial. KA-like pattern.   . Tremor   . Unspecified sleep apnea    HAS NOT USED CPAP IN 20 YEARS  . Venous insufficiency   . Wears dentures    full upper     Transfusion: None.   Consultants (if any):   Discharged Condition: Improved  Hospital Course: Jared Rice is an 87 y.o. male who was admitted 09/06/2020 with a diagnosis of primary degenerative arthrosis of the right knee and went to the operating room on 09/06/2020 and underwent the above named procedures.    Surgeries: Procedure(s): COMPUTER ASSISTED TOTAL KNEE ARTHROPLASTY on 09/06/2020 Patient tolerated the surgery well. Taken to PACU where she was stabilized and then transferred to the orthopedic floor.  Started on Eliquis 5mg  twice daily. Foot pumps applied bilaterally at 80 mm. Heels elevated on bed with rolled towels. No evidence of DVT. Negative [place]. Physical therapy started on day #1 for gait training and transfer. OT started day #1 for ADL and assisted devices.  Patient's IV , foley and hemovac was d/c on day #2.  Implants: DePuy PFC Sigma size 4 posterior stabilized femoral component (cemented), size 8 MBT tibial component (cemented), 38 mm 3 peg oval dome patella (cemented), and a 10 mm stabilized rotating platform polyethylene insert.  He was given perioperative antibiotics:  Anti-infectives (From admission, onward)   Start     Dose/Rate Route Frequency Ordered Stop   09/06/20 1900  ceFAZolin (ANCEF) IVPB 2g/100 mL premix        2 g 200 mL/hr over 30 Minutes Intravenous Every 6 hours 09/06/20 1734 09/07/20 0124   09/06/20 1009  ceFAZolin (ANCEF) 2-4 GM/100ML-% IVPB       Note to Pharmacy: Norton Blizzard  : cabinet override      09/06/20 1009 09/06/20 1223   09/06/20 0945  ceFAZolin (ANCEF) IVPB 2g/100 mL premix        2 g 200 mL/hr over 30 Minutes Intravenous On call to O.R. 09/06/20 2585 09/06/20 1252    .  He was given sequential compression devices, early ambulation, and Eliquis for DVT prophylaxis.  He benefited  maximally from the hospital stay and there were no complications.    Recent vital signs:  Vitals:   09/07/20 0503 09/07/20 0739  BP: 112/61 109/72  Pulse: (!) 58 68  Resp: 16 16  Temp: 97.7 F (36.5 C) 98 F (36.7 C)  SpO2: 97% 97%    Recent laboratory studies:  Lab Results  Component Value Date   HGB 11.6 (L) 08/24/2020   HGB 11.7 (L) 07/05/2020   HGB 11.6 (L) 11/11/2019   Lab Results  Component Value Date   WBC 7.2 08/24/2020   PLT 173 08/24/2020   Lab Results  Component Value Date   INR 1.3 (H) 08/24/2020   Lab Results  Component Value Date   NA 141 08/24/2020   K 4.0 08/24/2020   CL 107 08/24/2020   CO2 25 08/24/2020   BUN 16 08/24/2020   CREATININE 1.12 08/24/2020   GLUCOSE 90 08/24/2020    Discharge Medications:   Allergies as of 09/07/2020  Reactions   Dexilant [dexlansoprazole] Rash   Primidone Itching, Rash      Medication List    TAKE these medications   acetaminophen 500 MG tablet Commonly known as: TYLENOL Take 1,000 mg by mouth every 6 (six) hours as needed for moderate pain.   apixaban 5 MG Tabs tablet Commonly known as: Eliquis Take 1 tablet (5 mg total) by mouth 2 (two) times daily.   celecoxib 200 MG capsule Commonly known as: CELEBREX Take 1 capsule (200 mg total) by mouth 2 (two) times daily.   diclofenac Sodium 1 % Gel Commonly known as: VOLTAREN Apply 2 g topically 4 (four) times daily as needed (knee pain).   diphenhydramine-acetaminophen 25-500 MG Tabs tablet Commonly known as: TYLENOL PM Take 2 tablets by mouth at bedtime.   doxazosin 8 MG tablet Commonly known as: CARDURA Take 1 tablet (8 mg total) by mouth daily. What changed: when to take this   furosemide 40 MG tablet Commonly known as: LASIX Take 1 tablet by mouth once daily (dose change) What changed: See the new instructions.   levothyroxine 75 MCG tablet Commonly known as: SYNTHROID Take 1 tablet by mouth daily in the morning What changed:   how  much to take  how to take this  when to take this  additional instructions   ondansetron 4 MG tablet Commonly known as: ZOFRAN Take 1 tablet (4 mg total) by mouth every 6 (six) hours as needed for nausea.   oxyCODONE 5 MG immediate release tablet Commonly known as: Oxy IR/ROXICODONE Take 1 tablet (5 mg total) by mouth every 4 (four) hours as needed for moderate pain (pain score 4-6).   quinapril 20 MG tablet Commonly known as: ACCUPRIL Take 1 tablet (20 mg total) by mouth daily. What changed: when to take this   rosuvastatin 20 MG tablet Commonly known as: CRESTOR Patient takes 10mg  What changed:   how much to take  how to take this  when to take this  additional instructions   topiramate 50 MG tablet Commonly known as: TOPAMAX Take 1 tablet by mouth twice daily   traMADol 50 MG tablet Commonly known as: ULTRAM Take 1-2 tablets (50-100 mg total) by mouth every 6 (six) hours as needed for moderate pain.            Durable Medical Equipment  (From admission, onward)         Start     Ordered   09/06/20 1735  DME Walker rolling  Once       Question:  Patient needs a walker to treat with the following condition  Answer:  Total knee replacement status   09/06/20 1734   09/06/20 1735  DME Bedside commode  Once       Question:  Patient needs a bedside commode to treat with the following condition  Answer:  Total knee replacement status   09/06/20 1734          Diagnostic Studies: DG Knee Right Port  Result Date: 09/06/2020 CLINICAL DATA:  Postop right knee replacement. EXAM: PORTABLE RIGHT KNEE - 1-2 VIEW COMPARISON:  02/14/2020 FINDINGS: The prosthetic components appear well seated and well aligned. No acute fracture or evidence of an operative complication. IMPRESSION: Well-positioned total right knee arthroplasty. Electronically Signed   By: Lajean Manes M.D.   On: 09/06/2020 16:22    Disposition: Plan for discharge home this afternoon.      Follow-up Information    [facility] On 09/21/2020.  Specialty: Orthopedic Surgery Why: at 9:45am Contact information: [place] [address] [phone]        Dereck Leep, MD On 10/19/2020.   Specialty: Orthopedic Surgery Why: at 2:00pm Contact information: [place] [address] [phone]              Signed: MAYAN KLOEPFER PA-C 09/07/2020, 4:01 PM

## 2020-09-07 NOTE — Progress Notes (Signed)
  Subjective: 1 Day Post-Op Procedure(s) (LRB): COMPUTER ASSISTED TOTAL KNEE ARTHROPLASTY (Right) Patient reports pain as moderate.  Reports significant pain with Bone Foam. Patient is well, and has had no acute complaints or problems Plan is to go Home after hospital stay. Negative for chest pain and shortness of breath Fever: no Gastrointestinal: negative for nausea and vomiting.   Patient has not had a bowel movement.  Objective: Vital signs in last 24 hours: Temp:  [97.2 F (36.2 C)-98.5 F (36.9 C)] 98 F (36.7 C) (04/14 0739) Pulse Rate:  [57-77] 68 (04/14 0739) Resp:  [16-20] 16 (04/14 0739) BP: (109-151)/(61-97) 109/72 (04/14 0739) SpO2:  [94 %-100 %] 97 % (04/14 0739) Weight:  [116.8 kg] 116.8 kg (04/13 1011)  Intake/Output from previous day:  Intake/Output Summary (Last 24 hours) at 09/07/2020 0814 Last data filed at 09/07/2020 0527 Gross per 24 hour  Intake 1835.67 ml  Output 1115 ml  Net 720.67 ml    Intake/Output this shift: No intake/output data recorded.  Labs: No results for input(s): HGB in the last 72 hours. No results for input(s): WBC, RBC, HCT, PLT in the last 72 hours. No results for input(s): NA, K, CL, CO2, BUN, CREATININE, GLUCOSE, CALCIUM in the last 72 hours. No results for input(s): LABPT, INR in the last 72 hours.   EXAM General - Patient is Alert, Appropriate and Oriented Extremity - Neurovascular intact Dorsiflexion/Plantar flexion intact Compartment soft Dressing/Incision -Postoperative dressing remains in place., Polar Care in place and working. , Hemovac in place. Moderate sanguinous drainage noted over distal post-op dressing Motor Function - intact, moving foot and toes well on exam.  Cardiovascular- Irregular rate and rhythm, no m/r/g Respiratory- Lungs clear to auscultation bilaterally Gastrointestinal- soft, nontender and active bowel sounds   Assessment/Plan: 1 Day Post-Op Procedure(s) (LRB): COMPUTER ASSISTED TOTAL KNEE  ARTHROPLASTY (Right) Active Problems:   Total knee replacement status  Estimated body mass index is 33.06 kg/m as calculated from the following:   Height as of this encounter: 6\' 2"  (1.88 m).   Weight as of this encounter: 116.8 kg. Advance diet Up with therapy    DVT Prophylaxis - Eloquis, Ted hose and foot pumps Weight-Bearing as tolerated to right leg  Cassell Smiles, PA-C [facility] Orthopaedic Surgery 09/07/2020, 8:14 AM

## 2020-09-07 NOTE — Progress Notes (Signed)
Subjective: 1 Day Post-Op Procedure(s) (LRB): COMPUTER ASSISTED TOTAL KNEE ARTHROPLASTY (Right) Patient reports pain as mild.   Patient is well, and has had no acute complaints or problems Plan is to go Home after hospital stay. Negative for chest pain and shortness of breath Fever: no Gastrointestinal:Negative for nausea and vomiting Patient has completed all steps with PT and is anxious to return home. Patient has had a BM.  Objective: Vital signs in last 24 hours: Temp:  [97.2 F (36.2 C)-98.5 F (36.9 C)] 98 F (36.7 C) (04/14 0739) Pulse Rate:  [57-77] 68 (04/14 0739) Resp:  [16-20] 16 (04/14 0739) BP: (109-151)/(61-97) 109/72 (04/14 0739) SpO2:  [95 %-100 %] 97 % (04/14 0739)  Intake/Output from previous day:  Intake/Output Summary (Last 24 hours) at 09/07/2020 1555 Last data filed at 09/07/2020 1345 Gross per 24 hour  Intake 405.67 ml  Output 815 ml  Net -409.33 ml    Intake/Output this shift: Total I/O In: 120 [P.O.:120] Out: -   Labs: No results for input(s): HGB in the last 72 hours. No results for input(s): WBC, RBC, HCT, PLT in the last 72 hours. No results for input(s): NA, K, CL, CO2, BUN, CREATININE, GLUCOSE, CALCIUM in the last 72 hours. No results for input(s): LABPT, INR in the last 72 hours.   EXAM General - Patient is Alert, Appropriate and Oriented Extremity - ABD soft Sensation intact distally Intact pulses distally Dorsiflexion/Plantar flexion intact No cellulitis present Dressing/Incision - Bulky dressing removed, moderate bloody drainage to the honeycomb. Hemovac removed and honeycomb changed.  Observed incision for any active drainage which was not visualized.  No bleeding noted. 4x4 with tegaderm applied. Motor Function - intact, moving foot and toes well on exam.  Abdomen soft with normal bowel sounds.  Past Medical History:  Diagnosis Date  . Actinic keratosis   . Anginal pain ([place])   . Aortic atherosclerosis ([place])   .  Arthritis   . Atrial fibrillation ([place])   . Basal cell carcinoma 02/06/2010   Left shoulder. Superficial.  . Basal cell carcinoma 10/11/2013   Right medial forearm. Superficial  . Basal cell carcinoma 04/10/2015   Right inf. lat. thigh. Superficial  . Basal cell carcinoma 12/09/2016   Right cheek. Superficial.  . Chronic airway obstruction, not elsewhere classified   . Colon polyps   . Complication of anesthesia    SPINAL DID NOT WORK FOR LAST KNEE REPLACEMENT AND HAD TO HAVE GA  . Coronary atherosclerosis of native coronary artery    nonobstructive  . Diabetes mellitus without complication ([place])   . Essential hypertension, benign   . GERD (gastroesophageal reflux disease)    H/O  . Glucose intolerance (impaired glucose tolerance)   . Heart murmur   . History of kidney stones   . Hyperlipidemia   . Hypothyroidism   . Iron deficiency anemia   . Knee pain, right   . LBBB (left bundle branch block)   . Occlusion and stenosis of carotid artery without mention of cerebral infarction    wears compression stockings  . Other dyspnea and respiratory abnormality    w/ pseuodowheeze resolves with purse lip manuever  . Precordial pain   . Shoulder pain, right   . Squamous cell carcinoma of skin 10/06/2007   Right forearm. SCCis  . Squamous cell carcinoma of skin 10/11/2013   Left mid lat. pretibial. KA-like pattern.  . Tremor   . Unspecified sleep apnea    HAS NOT USED CPAP IN 20 YEARS  .  Venous insufficiency   . Wears dentures    full upper    Assessment/Plan: 1 Day Post-Op Procedure(s) (LRB): COMPUTER ASSISTED TOTAL KNEE ARTHROPLASTY (Right) Active Problems:   Total knee replacement status  Estimated body mass index is 33.06 kg/m as calculated from the following:   Height as of this encounter: 6\' 2"  (1.88 m).   Weight as of this encounter: 116.8 kg. Advance diet Up with therapy D/C IV fluids when tolerating po intake.  Bulky dressing removed and hemovac  removed. Patient has completed all tasks with PT, including stairs. Patient has had a BM. Plan for discharge home this afternoon.  DVT Prophylaxis - Foot Pumps, TED hose and Eliquis Weight-Bearing as tolerated to right leg  J. Cameron Proud, PA-C [facility] Orthopaedic Surgery 09/07/2020, 3:55 PM

## 2020-09-07 NOTE — Progress Notes (Signed)
Discharge Note: Reviewed discharge note with pt.  PT verbalized understanding. Compression stockings on. PT d/ced with Bone foam, Incentive spirometer, polar care, two extra honeycomb dressings, and personal belongings. IV intact upon removal. Pt transported to home by a family member.

## 2020-09-08 DIAGNOSIS — K5790 Diverticulosis of intestine, part unspecified, without perforation or abscess without bleeding: Secondary | ICD-10-CM | POA: Diagnosis not present

## 2020-09-08 DIAGNOSIS — G250 Essential tremor: Secondary | ICD-10-CM | POA: Diagnosis not present

## 2020-09-08 DIAGNOSIS — L570 Actinic keratosis: Secondary | ICD-10-CM | POA: Diagnosis not present

## 2020-09-08 DIAGNOSIS — I447 Left bundle-branch block, unspecified: Secondary | ICD-10-CM | POA: Diagnosis not present

## 2020-09-08 DIAGNOSIS — G4730 Sleep apnea, unspecified: Secondary | ICD-10-CM | POA: Diagnosis not present

## 2020-09-08 DIAGNOSIS — E1141 Type 2 diabetes mellitus with diabetic mononeuropathy: Secondary | ICD-10-CM | POA: Diagnosis not present

## 2020-09-08 DIAGNOSIS — I440 Atrioventricular block, first degree: Secondary | ICD-10-CM | POA: Diagnosis not present

## 2020-09-08 DIAGNOSIS — I700 Atherosclerosis of aorta: Secondary | ICD-10-CM | POA: Diagnosis not present

## 2020-09-08 DIAGNOSIS — J449 Chronic obstructive pulmonary disease, unspecified: Secondary | ICD-10-CM | POA: Diagnosis not present

## 2020-09-08 DIAGNOSIS — E785 Hyperlipidemia, unspecified: Secondary | ICD-10-CM | POA: Diagnosis not present

## 2020-09-08 DIAGNOSIS — D500 Iron deficiency anemia secondary to blood loss (chronic): Secondary | ICD-10-CM | POA: Diagnosis not present

## 2020-09-08 DIAGNOSIS — I872 Venous insufficiency (chronic) (peripheral): Secondary | ICD-10-CM | POA: Diagnosis not present

## 2020-09-08 DIAGNOSIS — K649 Unspecified hemorrhoids: Secondary | ICD-10-CM | POA: Diagnosis not present

## 2020-09-08 DIAGNOSIS — Z8572 Personal history of non-Hodgkin lymphomas: Secondary | ICD-10-CM | POA: Diagnosis not present

## 2020-09-08 DIAGNOSIS — K219 Gastro-esophageal reflux disease without esophagitis: Secondary | ICD-10-CM | POA: Diagnosis not present

## 2020-09-08 DIAGNOSIS — Z471 Aftercare following joint replacement surgery: Secondary | ICD-10-CM | POA: Diagnosis not present

## 2020-09-08 DIAGNOSIS — I10 Essential (primary) hypertension: Secondary | ICD-10-CM | POA: Diagnosis not present

## 2020-09-08 DIAGNOSIS — E1151 Type 2 diabetes mellitus with diabetic peripheral angiopathy without gangrene: Secondary | ICD-10-CM | POA: Diagnosis not present

## 2020-09-08 DIAGNOSIS — Z86010 Personal history of colonic polyps: Secondary | ICD-10-CM | POA: Diagnosis not present

## 2020-09-08 DIAGNOSIS — E039 Hypothyroidism, unspecified: Secondary | ICD-10-CM | POA: Diagnosis not present

## 2020-09-08 DIAGNOSIS — I4891 Unspecified atrial fibrillation: Secondary | ICD-10-CM | POA: Diagnosis not present

## 2020-09-08 DIAGNOSIS — N400 Enlarged prostate without lower urinary tract symptoms: Secondary | ICD-10-CM | POA: Diagnosis not present

## 2020-09-08 DIAGNOSIS — I2510 Atherosclerotic heart disease of native coronary artery without angina pectoris: Secondary | ICD-10-CM | POA: Diagnosis not present

## 2020-09-08 DIAGNOSIS — Z85828 Personal history of other malignant neoplasm of skin: Secondary | ICD-10-CM | POA: Diagnosis not present

## 2020-09-11 DIAGNOSIS — K649 Unspecified hemorrhoids: Secondary | ICD-10-CM | POA: Diagnosis not present

## 2020-09-11 DIAGNOSIS — I872 Venous insufficiency (chronic) (peripheral): Secondary | ICD-10-CM | POA: Diagnosis not present

## 2020-09-11 DIAGNOSIS — K219 Gastro-esophageal reflux disease without esophagitis: Secondary | ICD-10-CM | POA: Diagnosis not present

## 2020-09-11 DIAGNOSIS — E039 Hypothyroidism, unspecified: Secondary | ICD-10-CM | POA: Diagnosis not present

## 2020-09-11 DIAGNOSIS — I2510 Atherosclerotic heart disease of native coronary artery without angina pectoris: Secondary | ICD-10-CM | POA: Diagnosis not present

## 2020-09-11 DIAGNOSIS — I700 Atherosclerosis of aorta: Secondary | ICD-10-CM | POA: Diagnosis not present

## 2020-09-11 DIAGNOSIS — J449 Chronic obstructive pulmonary disease, unspecified: Secondary | ICD-10-CM | POA: Diagnosis not present

## 2020-09-11 DIAGNOSIS — G4730 Sleep apnea, unspecified: Secondary | ICD-10-CM | POA: Diagnosis not present

## 2020-09-11 DIAGNOSIS — I10 Essential (primary) hypertension: Secondary | ICD-10-CM | POA: Diagnosis not present

## 2020-09-11 DIAGNOSIS — D500 Iron deficiency anemia secondary to blood loss (chronic): Secondary | ICD-10-CM | POA: Diagnosis not present

## 2020-09-11 DIAGNOSIS — I4891 Unspecified atrial fibrillation: Secondary | ICD-10-CM | POA: Diagnosis not present

## 2020-09-11 DIAGNOSIS — E1151 Type 2 diabetes mellitus with diabetic peripheral angiopathy without gangrene: Secondary | ICD-10-CM | POA: Diagnosis not present

## 2020-09-11 DIAGNOSIS — K5790 Diverticulosis of intestine, part unspecified, without perforation or abscess without bleeding: Secondary | ICD-10-CM | POA: Diagnosis not present

## 2020-09-11 DIAGNOSIS — Z471 Aftercare following joint replacement surgery: Secondary | ICD-10-CM | POA: Diagnosis not present

## 2020-09-11 DIAGNOSIS — I447 Left bundle-branch block, unspecified: Secondary | ICD-10-CM | POA: Diagnosis not present

## 2020-09-11 DIAGNOSIS — G250 Essential tremor: Secondary | ICD-10-CM | POA: Diagnosis not present

## 2020-09-11 DIAGNOSIS — Z85828 Personal history of other malignant neoplasm of skin: Secondary | ICD-10-CM | POA: Diagnosis not present

## 2020-09-11 DIAGNOSIS — Z86010 Personal history of colonic polyps: Secondary | ICD-10-CM | POA: Diagnosis not present

## 2020-09-11 DIAGNOSIS — N400 Enlarged prostate without lower urinary tract symptoms: Secondary | ICD-10-CM | POA: Diagnosis not present

## 2020-09-11 DIAGNOSIS — L570 Actinic keratosis: Secondary | ICD-10-CM | POA: Diagnosis not present

## 2020-09-11 DIAGNOSIS — E785 Hyperlipidemia, unspecified: Secondary | ICD-10-CM | POA: Diagnosis not present

## 2020-09-11 DIAGNOSIS — Z8572 Personal history of non-Hodgkin lymphomas: Secondary | ICD-10-CM | POA: Diagnosis not present

## 2020-09-11 DIAGNOSIS — E1141 Type 2 diabetes mellitus with diabetic mononeuropathy: Secondary | ICD-10-CM | POA: Diagnosis not present

## 2020-09-11 DIAGNOSIS — I440 Atrioventricular block, first degree: Secondary | ICD-10-CM | POA: Diagnosis not present

## 2020-09-12 NOTE — Anesthesia Postprocedure Evaluation (Signed)
Anesthesia Post Note  Patient: Jared Rice A Hauk  Procedure(s) Performed: COMPUTER ASSISTED TOTAL KNEE ARTHROPLASTY (Right Knee)  Patient location during evaluation: PACU Anesthesia Type: Spinal Level of consciousness: awake and alert Pain management: pain level controlled Vital Signs Assessment: post-procedure vital signs reviewed and stable Respiratory status: spontaneous breathing, nonlabored ventilation, respiratory function stable and patient connected to nasal cannula oxygen Cardiovascular status: blood pressure returned to baseline and stable Postop Assessment: no apparent nausea or vomiting Anesthetic complications: no   No complications documented.   Last Vitals:  Vitals:   09/07/20 0503 09/07/20 0739  BP: 112/61 109/72  Pulse: (!) 58 68  Resp: 16 16  Temp: 36.5 C 36.7 C  SpO2: 97% 97%    Last Pain:  Vitals:   09/07/20 1200  TempSrc:   PainSc: 2                  Molli Barrows

## 2020-09-13 DIAGNOSIS — Z471 Aftercare following joint replacement surgery: Secondary | ICD-10-CM | POA: Diagnosis not present

## 2020-09-13 DIAGNOSIS — Z96651 Presence of right artificial knee joint: Secondary | ICD-10-CM | POA: Diagnosis not present

## 2020-09-13 DIAGNOSIS — Z9989 Dependence on other enabling machines and devices: Secondary | ICD-10-CM | POA: Diagnosis not present

## 2020-09-15 ENCOUNTER — Telehealth: Payer: Self-pay

## 2020-09-15 DIAGNOSIS — E039 Hypothyroidism, unspecified: Secondary | ICD-10-CM

## 2020-09-15 MED ORDER — LEVOTHYROXINE SODIUM 75 MCG PO TABS: 75.0000 ug | ORAL_TABLET | Freq: Every day | ORAL | 1 refills | Status: DC

## 2020-09-15 NOTE — Telephone Encounter (Signed)
[facility] faxed refill request for the following medications:  levothyroxine (SYNTHROID) 75 MCG tablet   Please advise.

## 2020-09-21 ENCOUNTER — Ambulatory Visit: Payer: Self-pay | Admitting: Family Medicine

## 2020-09-21 DIAGNOSIS — M25561 Pain in right knee: Secondary | ICD-10-CM | POA: Diagnosis not present

## 2020-09-22 DIAGNOSIS — Z96651 Presence of right artificial knee joint: Secondary | ICD-10-CM | POA: Diagnosis not present

## 2020-09-22 DIAGNOSIS — Z471 Aftercare following joint replacement surgery: Secondary | ICD-10-CM | POA: Diagnosis not present

## 2020-09-24 DIAGNOSIS — Z471 Aftercare following joint replacement surgery: Secondary | ICD-10-CM | POA: Diagnosis not present

## 2020-09-26 DIAGNOSIS — M25561 Pain in right knee: Secondary | ICD-10-CM | POA: Diagnosis not present

## 2020-09-28 ENCOUNTER — Ambulatory Visit: Payer: PPO | Admitting: Dermatology

## 2020-09-28 DIAGNOSIS — M25561 Pain in right knee: Secondary | ICD-10-CM | POA: Diagnosis not present

## 2020-10-03 DIAGNOSIS — M25561 Pain in right knee: Secondary | ICD-10-CM | POA: Diagnosis not present

## 2020-10-04 DIAGNOSIS — R531 Weakness: Secondary | ICD-10-CM | POA: Diagnosis not present

## 2020-10-05 ENCOUNTER — Telehealth: Payer: Self-pay

## 2020-10-05 DIAGNOSIS — M25561 Pain in right knee: Secondary | ICD-10-CM | POA: Diagnosis not present

## 2020-10-05 NOTE — Progress Notes (Signed)
Chronic Care Management Pharmacy Assistant   Name: ANTERO DEROSIA  MRN: [id number] DOB: Dec 15, 1932  Reason for Encounter:Hypertension and Hyperlipidemia Disease State Call.   Recent office visits:  None ID  Recent consult visits:  09/21/2020 Barbaraann Barthel PT (Physical Therapy) 09/21/2020 Tamala Julian (Orthopedic Surgery) 08/31/2020 Tamala Julian (Orthopedic surgery)  Hospital visits:  Medication Reconciliation was completed by comparing discharge summary, patient's EMR and Pharmacy list, and upon discussion with patient.  Admitted to the hospital on 09/06/2020 due to Total Knee replacment. Discharge date was 09/07/2020. Discharged from [place]?Medications Started at [facility] Discharge:?? -started none  Medication Changes at Hospital Discharge: -Changed none  Medications Discontinued at Hospital Discharge: -Stopped None   Medications that remain the same after Hospital Discharge:??  -All other medications will remain the same.    Medications: Outpatient Encounter Medications as of 10/05/2020  Medication Sig  . acetaminophen (TYLENOL) 500 MG tablet Take 1,000 mg by mouth every 6 (six) hours as needed for moderate pain.  [name] apixaban (ELIQUIS) 5 MG TABS tablet Take 1 tablet (5 mg total) by mouth 2 (two) times daily.  . celecoxib (CELEBREX) 200 MG capsule Take 1 capsule (200 mg total) by mouth 2 (two) times daily.  . diclofenac Sodium (VOLTAREN) 1 % GEL Apply 2 g topically 4 (four) times daily as needed (knee pain).  [name] diphenhydramine-acetaminophen (TYLENOL PM) 25-500 MG TABS Take 2 tablets by mouth at bedtime.  [name] doxazosin (CARDURA) 8 MG tablet Take 1 tablet (8 mg total) by mouth daily. (Patient taking differently: Take 8 mg by mouth at bedtime.)  . furosemide (LASIX) 40 MG tablet Take 1 tablet by mouth once daily (dose change) (Patient taking differently: Take 20 mg by mouth daily.)  . levothyroxine (SYNTHROID) 75 MCG tablet Take 1 tablet (75 mcg  total) by mouth daily before breakfast. Take 1 tablet by mouth daily in the morning  . ondansetron (ZOFRAN) 4 MG tablet Take 1 tablet (4 mg total) by mouth every 6 (six) hours as needed for nausea.  [name] oxyCODONE (OXY IR/ROXICODONE) 5 MG immediate release tablet Take 1 tablet (5 mg total) by mouth every 4 (four) hours as needed for moderate pain (pain score 4-6).  [name] quinapril (ACCUPRIL) 20 MG tablet Take 1 tablet (20 mg total) by mouth daily. (Patient taking differently: Take 20 mg by mouth at bedtime.)  . rosuvastatin (CRESTOR) 20 MG tablet Patient takes 10mg  (Patient taking differently: Take 10 mg by mouth at bedtime.)  . topiramate (TOPAMAX) 50 MG tablet Take 1 tablet by mouth twice daily (Patient taking differently: Take 50 mg by mouth 2 (two) times daily.)  . traMADol (ULTRAM) 50 MG tablet Take 1-2 tablets (50-100 mg total) by mouth every 6 (six) hours as needed for moderate pain.   No facility-administered encounter medications on file as of 10/05/2020.   Star Rating Drugs: Quinapril 20 mg last filled on 08/09/2020 for 90 day supply at [facility] order pharmacy. Rosuvastatin 10 mg last filled 08/23/2020 for 90 day supply at [facility] order pharmacy.  Reviewed chart prior to disease state call. Spoke with patient regarding BP  Recent Office Vitals: BP Readings from Last 3 Encounters:  09/07/20 109/72  08/24/20 102/68  06/22/20 (!) 138/94   Pulse Readings from Last 3 Encounters:  09/07/20 68  08/24/20 85  06/22/20 74    Wt Readings from Last 3 Encounters:  09/06/20 257 lb 8 oz (116.8 kg)  08/24/20 257 lb 8 oz (  116.8 kg)  06/22/20 264 lb (119.7 kg)     Kidney Function Lab Results  Component Value Date/Time   CREATININE 1.12 08/24/2020 12:18 PM   CREATININE 1.05 07/05/2020 10:31 AM   CREATININE 0.87 10/13/2013 10:55 AM   GFRNONAA >60 08/24/2020 12:18 PM   GFRNONAA >60 10/13/2013 10:55 AM   GFRAA 74 07/05/2020 10:31 AM   GFRAA >60 10/13/2013 10:55 AM    BMP Latest Ref  Rng & Units 08/24/2020 07/05/2020 11/11/2019  Glucose 70 - 99 mg/dL 90 102(H) 94  BUN 8 - 23 mg/dL 16 17 21   Creatinine 0.61 - 1.24 mg/dL 1.12 1.05 1.32(H)  BUN/Creat Ratio 10 - 24 - 16 16  Sodium 135 - 145 mmol/L 141 144 141  Potassium 3.5 - 5.1 mmol/L 4.0 4.3 4.4  Chloride 98 - 111 mmol/L 107 107(H) 102  CO2 22 - 32 mmol/L 25 23 27   Calcium 8.9 - 10.3 mg/dL 8.6(L) 8.8 8.6    . Current antihypertensive regimen:   quinapril 20 mg  One tablet daily  doxazosin 8 mg One tablet  daily . How often are you checking your Blood Pressure? Patient states he does not have a blood pressure machine to check his blood pressure.Patient reports his blood presure is always good when he has a follow up with his providers. . Current home BP readings: None ID . What recent interventions/DTPs have been made by any provider to improve Blood Pressure control since last CPP Visit: None ID . Any recent hospitalizations or ED visits since last visit with CPP? Yes . What diet changes have been made to improve Blood Pressure Control?  o No change Indicated . What exercise is being done to improve your Blood Pressure Control?  o Patient reports he is attending  physical therapy.   Adherence Review: Is the patient currently on ACE/ARB medication? Yes Does the patient have >5 day gap between last estimated fill dates? No   10/05/2020 Name: MOUHAMADOU GITTLEMAN MRN: [id number] DOB: 03/17/1933 Gerda Diss Vadala is a 87 y.o. year old male who is a primary care patient of Jerrol Banana., MD.  Comprehensive medication review performed; Spoke to patient regarding cholesterol  Lipid Panel    Component Value Date/Time   CHOL 100 07/05/2020 1031   TRIG 36 07/05/2020 1031   HDL 39 (L) 07/05/2020 1031   LDLCALC 51 07/05/2020 1031    10-year ASCVD risk score: The ASCVD Risk score Mikey Bussing DC Jr., et al., 2013) failed to calculate for the following reasons:   The 2013 ASCVD risk score is only valid for ages 47 to  98  . Current antihyperlipidemic regimen:  o Rosuvastatin 10 mg 1 tablet daily  . Previous antihyperlipidemic medications tried: None ID . ASCVD risk enhancing conditions: age >67 and HTN . What recent interventions/DTPs have been made by any provider to improve Cholesterol control since last CPP Visit: None ID . Any recent hospitalizations or ED visits since last visit with CPP? Yes . What diet changes have been made to improve Cholesterol?  o No Change Indicated . What exercise is being done to improve Cholesterol?  o Patient reports he is attending  physical therapy.  Adherence Review: Does the patient have >5 day gap between last estimated fill dates? No   Anderson Malta Clinical Production designer, theatre/television/film [phone]

## 2020-10-12 DIAGNOSIS — M25561 Pain in right knee: Secondary | ICD-10-CM | POA: Diagnosis not present

## 2020-10-17 ENCOUNTER — Telehealth: Payer: Self-pay

## 2020-10-17 DIAGNOSIS — M25561 Pain in right knee: Secondary | ICD-10-CM | POA: Diagnosis not present

## 2020-10-17 NOTE — Telephone Encounter (Signed)
Pt called for refill of [facility] intertrigo cream, ok [facility] intertrigo cream emailed to [facility]

## 2020-10-19 DIAGNOSIS — Z96651 Presence of right artificial knee joint: Secondary | ICD-10-CM | POA: Insufficient documentation

## 2020-10-25 ENCOUNTER — Other Ambulatory Visit: Payer: Self-pay

## 2020-10-25 ENCOUNTER — Ambulatory Visit: Payer: PPO | Admitting: Dermatology

## 2020-10-25 DIAGNOSIS — L300 Nummular dermatitis: Secondary | ICD-10-CM | POA: Diagnosis not present

## 2020-10-25 DIAGNOSIS — L309 Dermatitis, unspecified: Secondary | ICD-10-CM

## 2020-10-25 MED ORDER — TRIAMCINOLONE ACETONIDE 0.1 % EX CREA: 1.0000 "application " | TOPICAL_CREAM | Freq: Two times a day (BID) | CUTANEOUS | 0 refills | Status: DC

## 2020-10-25 MED ORDER — TACROLIMUS 0.1 % EX OINT: TOPICAL_OINTMENT | Freq: Two times a day (BID) | CUTANEOUS | 1 refills | Status: DC

## 2020-10-25 NOTE — Progress Notes (Signed)
   Follow-Up Visit   Subjective  Jared Rice is a 87 y.o. male who presents for the following: Rash (Buttocks, ~3wks, itchy prn, Vasolin qhs).  Patient accompanied by wife who contributes to history.  The following portions of the chart were reviewed this encounter and updated as appropriate:       Review of Systems:  No other skin or systemic complaints except as noted in HPI or Assessment and Plan.  Objective  Well appearing patient in no apparent distress; mood and affect are within normal limits.  A focused examination was performed including buttocks. Relevant physical exam findings are noted in the Assessment and Plan.  Objective  buttocks: Pink scaly patch bil medial buttocks   Assessment & Plan  Dermatitis buttocks  Nummular Dermatitis  Start Triamcinolone cream 1 to 2 times a day to rash on buttocks for up to 2 weeks, after 2 weeks stop the Triamcinolone cream Start Tacrolimus ointment 1 to 2 times a day to rash on buttocks until clear,  When using with triamcinolone cream apply triamcinolone cream first and then the tacrolimus ointment  triamcinolone cream (KENALOG) 0.1 % - buttocks  tacrolimus (PROTOPIC) 0.1 % ointment - buttocks  Return in about 1 month (around 11/24/2020) for dermatitis.   I, Othelia Pulling, RMA, am acting as scribe for Brendolyn Patty, MD .'  Documentation: I have reviewed the above documentation for accuracy and completeness, and I agree with the above.  Brendolyn Patty MD

## 2020-10-25 NOTE — Patient Instructions (Addendum)
If you have any questions or concerns for your doctor, please call our main line at [phone] and press option 4 to reach your doctor's medical assistant. If no one answers, please leave a voicemail as directed and we will return your call as soon as possible. Messages left after 4 pm will be answered the following business day.   You may also send [place] a message via [place]. We typically respond to MyChart messages within 1-2 business days.  For prescription refills, please ask your pharmacy to contact our office. Our fax number is [phone].  If you have an urgent issue when the clinic is closed that cannot wait until the next business day, you can page your doctor at the number below.    Please note that while we do our best to be available for urgent issues outside of office hours, we are not available 24/7.   If you have an urgent issue and are unable to reach [place], you may choose to seek medical care at your doctor's office, retail clinic, urgent care center, or emergency room.  If you have a medical emergency, please immediately call 911 or go to the emergency department.  Pager Numbers  - Dr. Nehemiah Massed: [phone]  - Dr. Laurence Ferrari: [phone]  - Dr. Nicole Kindred: [phone]  In the event of inclement weather, please call our main line at [phone] for an update on the status of any delays or closures.  Dermatology Medication Tips: Please keep the boxes that topical medications come in in order to help keep track of the instructions about where and how to use these. Pharmacies typically print the medication instructions only on the boxes and not directly on the medication tubes.   If your medication is too expensive, please contact our office at [phone] option 4 or send [place] a message through [place].   We are unable to tell what your co-pay for medications will be in advance as this is different depending on your insurance coverage. However, we may be able to find a substitute  medication at lower cost or fill out paperwork to get insurance to cover a needed medication.   If a prior authorization is required to get your medication covered by your insurance company, please allow [place] 1-2 business days to complete this process.  Drug prices often vary depending on where the prescription is filled and some pharmacies may offer cheaper prices.  The website www.goodrx.com contains coupons for medications through different pharmacies. The prices here do not account for what the cost may be with help from insurance (it may be cheaper with your insurance), but the website can give you the price if you did not use any insurance.  - You can print the associated coupon and take it with your prescription to the pharmacy.  - You may also stop by our office during regular business hours and pick up a GoodRx coupon card.  - If you need your prescription sent electronically to a different pharmacy, notify our office through [facility] or by phone at [phone] option 4.    Start Triamcinolone cream 1 to 2 times a day to rash on buttocks for up to 2 weeks, after 2 weeks stop the Triamcinolone cream Start Tacrolimus ointment 1 to 2 times a day to rash on buttocks until clear,  When using with triamcinolone cream apply triamcinolone cream first and then the tacrolimus ointment

## 2020-10-26 DIAGNOSIS — M25561 Pain in right knee: Secondary | ICD-10-CM | POA: Diagnosis not present

## 2020-10-31 ENCOUNTER — Telehealth: Payer: Self-pay | Admitting: Cardiology

## 2020-10-31 ENCOUNTER — Other Ambulatory Visit: Payer: Self-pay

## 2020-10-31 DIAGNOSIS — I712 Thoracic aortic aneurysm, without rupture, unspecified: Secondary | ICD-10-CM

## 2020-10-31 DIAGNOSIS — M25561 Pain in right knee: Secondary | ICD-10-CM | POA: Diagnosis not present

## 2020-10-31 NOTE — Telephone Encounter (Signed)
Spoke with patient regarding the Monday 11/06/20 3:00pm CTA chest/aorta scheduled at [facility]---arrival time is 2:45 pm--1st floor admissions office for check in.  Liquids only 4 hours prior to study----patient to have lab work this week at [facility].  Patient voiced his understanding

## 2020-10-31 NOTE — Addendum Note (Signed)
Addended by: Waylan Rocher on: 10/31/2020 08:23 AM   Modules accepted: Orders

## 2020-10-31 NOTE — Addendum Note (Signed)
Addended by: Waylan Rocher on: 10/31/2020 07:54 AM   Modules accepted: Orders

## 2020-11-02 DIAGNOSIS — M25561 Pain in right knee: Secondary | ICD-10-CM | POA: Diagnosis not present

## 2020-11-03 DIAGNOSIS — I712 Thoracic aortic aneurysm, without rupture: Secondary | ICD-10-CM | POA: Diagnosis not present
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
## 2020-11-04 LAB — BASIC METABOLIC PANEL
BUN/Creatinine Ratio: 14 (ref 10–24)
BUN: 18 mg/dL (ref 8–27)
CO2: 25 mmol/L (ref 20–29)
Calcium: 8.7 mg/dL (ref 8.6–10.2)
Chloride: 106 mmol/L (ref 96–106)
Creatinine, Ser: 1.33 mg/dL — ABNORMAL HIGH (ref 0.76–1.27)
Glucose: 100 mg/dL — ABNORMAL HIGH (ref 65–99)
Potassium: 4.6 mmol/L (ref 3.5–5.2)
Sodium: 142 mmol/L (ref 134–144)
eGFR: 52 mL/min/{1.73_m2} — ABNORMAL LOW (ref >59)

## 2020-11-06 ENCOUNTER — Ambulatory Visit (HOSPITAL_COMMUNITY)
Admission: RE | Admit: 2020-11-06 | Discharge: 2020-11-06 | Disposition: A | Discharge status: Home / Self Care | Payer: PPO | Source: Ambulatory Visit | Attending: General Practice | Admitting: General Practice

## 2020-11-06 ENCOUNTER — Other Ambulatory Visit: Payer: Self-pay

## 2020-11-06 DIAGNOSIS — I712 Thoracic aortic aneurysm, without rupture, unspecified: Secondary | ICD-10-CM

## 2020-11-06 DIAGNOSIS — K8020 Calculus of gallbladder without cholecystitis without obstruction: Secondary | ICD-10-CM | POA: Diagnosis not present

## 2020-11-06 MED ORDER — IOHEXOL 350 MG/ML SOLN
100.0000 mL | Freq: Once | INTRAVENOUS | Status: AC | PRN
  Administered 2020-11-06: 100 mL via INTRAVENOUS

## 2020-11-07 DIAGNOSIS — M25561 Pain in right knee: Secondary | ICD-10-CM | POA: Diagnosis not present

## 2020-11-07 NOTE — Progress Notes (Signed)
Seen by patient Jared Rice on 11/06/2020 10:38 AM

## 2020-11-09 ENCOUNTER — Telehealth: Payer: Self-pay | Admitting: Cardiology

## 2020-11-09 NOTE — Telephone Encounter (Signed)
Spoke to patient chest ct results given.He will keep appointment already scheduled with Dr.Jordan 11/14/20 at 3:40 am.

## 2020-11-09 NOTE — Telephone Encounter (Signed)
PT is calling to find out what his results mean

## 2020-11-10 DIAGNOSIS — E1142 Type 2 diabetes mellitus with diabetic polyneuropathy: Secondary | ICD-10-CM | POA: Diagnosis not present

## 2020-11-10 DIAGNOSIS — M79675 Pain in left toe(s): Secondary | ICD-10-CM | POA: Diagnosis not present

## 2020-11-10 DIAGNOSIS — Q828 Other specified congenital malformations of skin: Secondary | ICD-10-CM | POA: Diagnosis not present

## 2020-11-10 DIAGNOSIS — B351 Tinea unguium: Secondary | ICD-10-CM | POA: Diagnosis not present

## 2020-11-10 DIAGNOSIS — L84 Corns and callosities: Secondary | ICD-10-CM | POA: Diagnosis not present

## 2020-11-10 DIAGNOSIS — I872 Venous insufficiency (chronic) (peripheral): Secondary | ICD-10-CM | POA: Diagnosis not present

## 2020-11-10 DIAGNOSIS — I739 Peripheral vascular disease, unspecified: Secondary | ICD-10-CM | POA: Diagnosis not present

## 2020-11-10 DIAGNOSIS — D2372 Other benign neoplasm of skin of left lower limb, including hip: Secondary | ICD-10-CM | POA: Diagnosis not present

## 2020-11-10 DIAGNOSIS — E6609 Other obesity due to excess calories: Secondary | ICD-10-CM | POA: Diagnosis not present

## 2020-11-10 DIAGNOSIS — L851 Acquired keratosis [keratoderma] palmaris et plantaris: Secondary | ICD-10-CM | POA: Diagnosis not present

## 2020-11-10 DIAGNOSIS — Z6832 Body mass index (BMI) 32.0-32.9, adult: Secondary | ICD-10-CM | POA: Diagnosis not present

## 2020-11-10 DIAGNOSIS — M79674 Pain in right toe(s): Secondary | ICD-10-CM | POA: Diagnosis not present

## 2020-11-14 ENCOUNTER — Other Ambulatory Visit: Payer: Self-pay

## 2020-11-14 ENCOUNTER — Encounter: Payer: Self-pay | Admitting: Cardiology

## 2020-11-14 ENCOUNTER — Ambulatory Visit: Payer: PPO | Admitting: Cardiology

## 2020-11-14 VITALS — BP 114/68 | HR 70 | Ht 73.0 in | Wt 248.2 lb

## 2020-11-14 DIAGNOSIS — I4819 Other persistent atrial fibrillation: Secondary | ICD-10-CM

## 2020-11-14 DIAGNOSIS — I2510 Atherosclerotic heart disease of native coronary artery without angina pectoris: Secondary | ICD-10-CM

## 2020-11-14 DIAGNOSIS — I712 Thoracic aortic aneurysm, without rupture, unspecified: Secondary | ICD-10-CM

## 2020-11-14 DIAGNOSIS — I10 Essential (primary) hypertension: Secondary | ICD-10-CM

## 2020-11-21 DIAGNOSIS — M25561 Pain in right knee: Secondary | ICD-10-CM | POA: Diagnosis not present

## 2020-11-22 ENCOUNTER — Telehealth: Payer: Self-pay

## 2020-11-22 NOTE — Telephone Encounter (Signed)
Pt called for refill of [facility] intertrigo cream, ok [facility] intertrigo cream emailed to [facility]

## 2020-11-24 DIAGNOSIS — M7542 Impingement syndrome of left shoulder: Secondary | ICD-10-CM | POA: Diagnosis not present

## 2020-11-24 DIAGNOSIS — M7582 Other shoulder lesions, left shoulder: Secondary | ICD-10-CM | POA: Diagnosis not present

## 2020-11-24 DIAGNOSIS — Z96651 Presence of right artificial knee joint: Secondary | ICD-10-CM | POA: Diagnosis not present

## 2020-11-29 ENCOUNTER — Other Ambulatory Visit: Payer: Self-pay

## 2020-11-29 ENCOUNTER — Ambulatory Visit: Payer: PPO | Admitting: Dermatology

## 2020-11-29 DIAGNOSIS — L304 Erythema intertrigo: Secondary | ICD-10-CM | POA: Diagnosis not present

## 2020-11-29 DIAGNOSIS — L309 Dermatitis, unspecified: Secondary | ICD-10-CM

## 2020-11-29 DIAGNOSIS — L89312 Pressure ulcer of right buttock, stage 2: Secondary | ICD-10-CM | POA: Diagnosis not present

## 2020-11-29 MED ORDER — KETOCONAZOLE 2 % EX CREA: 1.0000 "application " | TOPICAL_CREAM | CUTANEOUS | 1 refills | Status: AC

## 2020-11-29 MED ORDER — MUPIROCIN 2 % EX OINT: 1.0000 "application " | TOPICAL_OINTMENT | Freq: Every day | CUTANEOUS | 0 refills | Status: DC

## 2020-11-29 NOTE — Patient Instructions (Addendum)
Discontinue Triamcinolone 0.1% cream. Continue Tacrolimus oint qd-bid as needed. Apply Mupirocin oint and band aid to ulcer of buttock. Apply Ketoconazole cream to buttocks once or twice daily.    If you have any questions or concerns for your doctor, please call our main line at [phone] and press option 4 to reach your doctor's medical assistant. If no one answers, please leave a voicemail as directed and we will return your call as soon as possible. Messages left after 4 pm will be answered the following business day.   You may also send [place] a message via [place]. We typically respond to MyChart messages within 1-2 business days.  For prescription refills, please ask your pharmacy to contact our office. Our fax number is [phone].  If you have an urgent issue when the clinic is closed that cannot wait until the next business day, you can page your doctor at the number below.    Please note that while we do our best to be available for urgent issues outside of office hours, we are not available 24/7.   If you have an urgent issue and are unable to reach [place], you may choose to seek medical care at your doctor's office, retail clinic, urgent care center, or emergency room.  If you have a medical emergency, please immediately call 911 or go to the emergency department.  Pager Numbers  - Dr. Nehemiah Massed: [phone]  - Dr. Laurence Ferrari: [phone]  - Dr. Nicole Kindred: [phone]  In the event of inclement weather, please call our main line at [phone] for an update on the status of any delays or closures.  Dermatology Medication Tips: Please keep the boxes that topical medications come in in order to help keep track of the instructions about where and how to use these. Pharmacies typically print the medication instructions only on the boxes and not directly on the medication tubes.   If your medication is too expensive, please contact our office at [phone] option 4 or send [place] a  message through [place].   We are unable to tell what your co-pay for medications will be in advance as this is different depending on your insurance coverage. However, we may be able to find a substitute medication at lower cost or fill out paperwork to get insurance to cover a needed medication.   If a prior authorization is required to get your medication covered by your insurance company, please allow [place] 1-2 business days to complete this process.  Drug prices often vary depending on where the prescription is filled and some pharmacies may offer cheaper prices.  The website www.goodrx.com contains coupons for medications through different pharmacies. The prices here do not account for what the cost may be with help from insurance (it may be cheaper with your insurance), but the website can give you the price if you did not use any insurance.  - You can print the associated coupon and take it with your prescription to the pharmacy.  - You may also stop by our office during regular business hours and pick up a GoodRx coupon card.  - If you need your prescription sent electronically to a different pharmacy, notify our office through [facility] or by phone at [phone] option 4.

## 2020-11-29 NOTE — Progress Notes (Signed)
   Follow-Up Visit   Subjective  Jared Rice is a 87 y.o. male who presents for the following: Follow-up (Nummular Dermatitis follow up of buttocks - TMC and Tacrolimus have not made a big improvement). Used TMC for the full month rather than just two weeks.  Now has open sore.  Accompanied by wife  The following portions of the chart were reviewed this encounter and updated as appropriate:        Review of Systems:  No other skin or systemic complaints except as noted in HPI or Assessment and Plan.  Objective  Well appearing patient in no apparent distress; mood and affect are within normal limits.  A focused examination was performed including buttocks. Relevant physical exam findings are noted in the Assessment and Plan.  Gluteal Crease Violaceous patch with mild scale of bilateral medial buttock  Right medial buttock 9 x 5 mm full thickness ulcer    Assessment & Plan  Dermatitis Gluteal Crease  Improved  Discontinue TMC 0.1% cream due to risk skin atrophy.  May continue Tacrolimus 0.1% oint qd-bid prn itch  Related Medications tacrolimus (PROTOPIC) 0.1 % ointment Apply topically 2 (two) times daily. Bid aa rash on buttocks until clear then prn flares, avoid face, groin, axilla  Pressure injury of right buttock, stage 2 (HCC) Right medial buttock  Chronic ulceration due to pressure (decubitus) Avoid sitting in one position for long periods, get up and move around. When seated, recommend a donut pillow to alleviate the pressure on the tailbone.  Mupirocin ointment and band aid daily Consider Duoderm if not improved on f/up  mupirocin ointment (BACTROBAN) 2 % - Right medial buttock Apply 1 application topically daily. And cover with band aid  Erythema intertrigo Buttocks, inguinal creases  Intertrigo is a chronic recurrent rash that occurs in skin fold areas that may be associated with friction; heat; moisture; yeast; fungus; and bacteria.  It is exacerbated  by increased movement / activity; sweating; and higher atmospheric temperature.   Start ketoconazole 2% cream qd/bid to body folds  ketoconazole (NIZORAL) 2 % cream - Buttocks, inguinal creases Apply 1 application topically as directed. Qd-bid  Return for Follow up 1 mo as scheduled with Dr. Nehemiah Massed.  I, Ashok Cordia, CMA, am acting as scribe for Brendolyn Patty, MD .  Documentation: I have reviewed the above documentation for accuracy and completeness, and I agree with the above.  Brendolyn Patty MD

## 2020-12-07 ENCOUNTER — Telehealth: Payer: Self-pay

## 2020-12-07 NOTE — Progress Notes (Signed)
    Chronic Care Management Pharmacy Assistant   Name: Jared Rice  MRN: [id number] DOB: [birth year]  PAP Assistance for Eliquis to be completed as well for the patient today.  Patient called to remind of appointment with Junius Argyle, CPP on 12/08/2020 @ 1300 via Telephone.  Patient aware of appointment date, time, and type of appointment (either telephone or in person). Patient aware to have/bring all medications, supplements, blood pressure and/or blood sugar logs to visit.  Questions: Have you had any recent office visit or specialist visit outside of [place]? Yes he saw Dr. Marry Guan 05/26 for knee surgery  Are there any concerns you would like to discuss during your office visit? Nothing he can think of at this moment  Are you having any problems obtaining your medications? Yes patient reports he does need assistance with Eliquis and I Informed him I will do the paperwork for PAP today and get it in the mail for him. He should receive the application next week and once he does make sure he signs it and returns it along with proof of income to the clinic to have them fax it for him. Patient v/u to all.  PAP application completed and emailed to Pattricia Boss, PTM for her to print and mail to the patient's confirmed address on file.   If patient has any PAP medications ask if they are having any problems getting their PAP medication or refill?  Care Gaps: FOOT EXAM, OPHTHALMOLOGY EXAM (last completed 04/27/2019) , COVID-19 Vaccine    Star Rating Drugs: Quinapril 20 mg last filled on 11/24/2020 for a 90-Day supply at [facility] Rosuvastatin 20 mg last filled on 11/24/2020 for a 90-Day supply at [facility]  Any gaps in medications fill history? No care gaps with any of the patients medications  Lynann Bologna, CPA/CMA Clinical Pharmacist Assistant Phone: [phone]

## 2020-12-08 ENCOUNTER — Ambulatory Visit (INDEPENDENT_AMBULATORY_CARE_PROVIDER_SITE_OTHER): Payer: PPO

## 2020-12-08 DIAGNOSIS — I4891 Unspecified atrial fibrillation: Secondary | ICD-10-CM

## 2020-12-08 DIAGNOSIS — E785 Hyperlipidemia, unspecified: Secondary | ICD-10-CM

## 2020-12-08 DIAGNOSIS — I10 Essential (primary) hypertension: Secondary | ICD-10-CM

## 2020-12-08 NOTE — Progress Notes (Signed)
Chronic Care Management Pharmacy Note  12/11/2020 Name:  Jared Rice MRN:  [id number] DOB:  09-20-1932  Summary: Patient presents for CCM follow-up. He underwent Knee replacement in April recovering well. He continues to follow PT exercises to maintain his strength. He has no complaints regarding his medications, but does report he just entered the [facility] for his Eliquis.  Recommendations/Changes made from today's visit: Continue current medications Will see if patient qualifies for patient assistance after reaching 3% out of pocket cost  Plan: CPP follow-up in 6 months    Subjective: Jared Rice is an 87 y.o. year old male who is a primary patient of Jerrol Banana., MD.  The CCM team was consulted for assistance with disease management and care coordination needs.    Engaged with patient by telephone for follow up visit in response to provider referral for pharmacy case management and/or care coordination services.   Consent to Services:  The patient was given information about Chronic Care Management services, agreed to services, and gave verbal consent prior to initiation of services.  Please see initial visit note for detailed documentation.   Patient Care Team: Jerrol Banana., MD as PCP - General (Unknown Physician Specialty) Martinique, Peter M, MD as PCP - Cardiology (Cardiology) Dingeldein, Remo Lipps, MD as Consulting Physician (Ophthalmology) Ralene Bathe, MD (Dermatology) Marry Guan Laurice Record, MD (Orthopedic Surgery) Caroline More, DPM as Consulting Physician (Podiatry)  Recent office visits: 06/22/20: Patient presented to Dr. Rosanna Randy for follow-up.   Recent consult visits: 11/14/20: Patient presented to Dr. Martinique (Cardiology) for follow-up. Celecoxib, ondansetron, oxycodone stopped.   Hospital visits: 09/06/20: Patient hospitalized for total knee replacement.    Objective:  Lab Results  Component Value Date   CREATININE 1.33 (H) 11/03/2020    BUN 18 11/03/2020   GFRNONAA >60 08/24/2020   GFRAA 74 07/05/2020   NA 142 11/03/2020   K 4.6 11/03/2020   CALCIUM 8.7 11/03/2020   CO2 25 11/03/2020   GLUCOSE 100 (H) 11/03/2020    Lab Results  Component Value Date/Time   HGBA1C 5.9 (H) 07/05/2020 10:31 AM   HGBA1C 6.2 (A) 10/20/2019 01:43 PM   HGBA1C 6.3 (H) 06/25/2019 09:08 AM    Last diabetic Eye exam:  Lab Results  Component Value Date/Time   HMDIABEYEEXA No Retinopathy 04/27/2019 12:00 AM    Last diabetic Foot exam: No results found for: HMDIABFOOTEX   Lab Results  Component Value Date   CHOL 100 07/05/2020   HDL 39 (L) 07/05/2020   LDLCALC 51 07/05/2020   TRIG 36 07/05/2020   CHOLHDL 2.6 07/05/2020    Hepatic Function Latest Ref Rng & Units 08/24/2020 07/05/2020 11/11/2019  Total Protein 6.5 - 8.1 g/dL 6.4(L) 5.8(L) 6.0  Albumin 3.5 - 5.0 g/dL 3.9 4.0 3.9  AST 15 - 41 U/L 10(L) 8 8  ALT 0 - 44 U/L 6 6 5   Alk Phosphatase 38 - 126 U/L 51 67 66  Total Bilirubin 0.3 - 1.2 mg/dL 0.7 0.4 0.4    Lab Results  Component Value Date/Time   TSH 3.840 07/05/2020 10:31 AM   TSH 4.130 06/25/2019 09:08 AM    CBC Latest Ref Rng & Units 08/24/2020 07/05/2020 11/11/2019  WBC 4.0 - 10.5 K/uL 7.2 5.4 6.4  Hemoglobin 13.0 - 17.0 g/dL 11.6(L) 11.7(L) 11.6(L)  Hematocrit 39.0 - 52.0 % 37.3(L) 36.2(L) 37.7  Platelets 150 - 400 K/uL 173 164 160    No results found for: VD25OH  Clinical ASCVD:  No  The ASCVD Risk score Mikey Bussing DC Jr., et al., 2013) failed to calculate for the following reasons:   The 2013 ASCVD risk score is only valid for ages 51 to 35    Depression screen PHQ 2/9 05/31/2020 03/20/2020 05/24/2019  Decreased Interest 0 0 0  Down, Depressed, Hopeless 0 0 0  PHQ - 2 Score 0 0 0    Social History   Tobacco Use  Smoking Status Former   Packs/day: 2.00   Years: 31.00   Pack years: 62.00   Types: Cigarettes   Quit date: 12/24/1980   Years since quitting: 39.9  Smokeless Tobacco Never  Tobacco Comments   quit  in 1982   BP Readings from Last 3 Encounters:  11/14/20 114/68  09/07/20 109/72  08/24/20 102/68   Pulse Readings from Last 3 Encounters:  11/14/20 70  09/07/20 68  08/24/20 85   Wt Readings from Last 3 Encounters:  11/14/20 248 lb 3.2 oz (112.6 kg)  09/06/20 257 lb 8 oz (116.8 kg)  08/24/20 257 lb 8 oz (116.8 kg)   BMI Readings from Last 3 Encounters:  11/14/20 32.75 kg/m  09/06/20 33.06 kg/m  08/24/20 33.06 kg/m    Assessment/Interventions: Review of patient past medical history, allergies, medications, health status, including review of consultants reports, laboratory and other test data, was performed as part of comprehensive evaluation and provision of chronic care management services.   SDOH:  (Social Determinants of Health) assessments and interventions performed: Yes SDOH Interventions    Flowsheet Row Most Recent Value  SDOH Interventions   Financial Strain Interventions Intervention Not Indicated      SDOH Screenings   Alcohol Screen: Low Risk    Last Alcohol Screening Score (AUDIT): 0  Depression (PHQ2-9): Low Risk    PHQ-2 Score: 0  Financial Resource Strain: Medium Risk   Difficulty of Paying Living Expenses: Somewhat hard  Food Insecurity: No Food Insecurity   Worried About Charity fundraiser in the Last Year: Never true   Ran Out of Food in the Last Year: Never true  Housing: Low Risk    Last Housing Risk Score: 0  Physical Activity: Inactive   Days of Exercise per Week: 0 days   Minutes of Exercise per Session: 0 min  Social Connections: Moderately Integrated   Frequency of Communication with Friends and Family: More than three times a week   Frequency of Social Gatherings with Friends and Family: More than three times a week   Attends Religious Services: Never   Marine scientist or Organizations: Yes   Attends Music therapist: More than 4 times per year   Marital Status: Married  Stress: No Stress Concern Present    Feeling of Stress : Not at all  Tobacco Use: Medium Risk   Smoking Tobacco Use: Former   Smokeless Tobacco Use: Never  Transportation Needs: No Data processing manager (Medical): No   Lack of Transportation (Non-Medical): No    CCM Care Plan  Allergies  Allergen Reactions   Dexilant [Dexlansoprazole] Rash   Primidone Itching and Rash    Medications Reviewed Today     Reviewed by Arvin Collard, CMA (Certified Medical Assistant) on 11/29/20 at 78  Med List Status: <None>   Medication Order Taking? Sig Documenting Provider Last Dose Status Informant  acetaminophen (TYLENOL) 500 MG tablet [id number] No Take 1,000 mg by mouth every 6 (six) hours as needed for moderate pain. [provider]  Taking Active Self  apixaban (ELIQUIS) 5 MG TABS tablet [id number] No Take 1 tablet (5 mg total) by mouth 2 (two) times daily. Martinique, Peter M, MD Taking Active Self  diclofenac Sodium (VOLTAREN) 1 % GEL [id number] No Apply 2 g topically 4 (four) times daily as needed (knee pain). [provider] Taking Active Self  diphenhydramine-acetaminophen (TYLENOL PM) 25-500 MG TABS [id number] No Take 2 tablets by mouth at bedtime. [provider] Taking Active Self  doxazosin (CARDURA) 8 MG tablet [id number] No Take 1 tablet (8 mg total) by mouth daily.  Patient taking differently: Take 8 mg by mouth at bedtime.   Jerrol Banana., MD Taking Active   furosemide (LASIX) 40 MG tablet [id number] No Take 1 tablet by mouth once daily (dose change)  Patient taking differently: Take 20 mg by mouth daily.   Jerrol Banana., MD Taking Active Self  levothyroxine (SYNTHROID) 75 MCG tablet [id number] No Take 1 tablet (75 mcg total) by mouth daily before breakfast. Take 1 tablet by mouth daily in the morning Jerrol Banana., MD Taking Active   quinapril (ACCUPRIL) 20 MG tablet [id number] No Take 1 tablet (20 mg total) by mouth daily.  Patient taking  differently: Take 20 mg by mouth at bedtime.   Jerrol Banana., MD Taking Active   rosuvastatin (CRESTOR) 20 MG tablet [id number] No Patient takes 63m  Patient taking differently: Take 10 mg by mouth at bedtime.   GJerrol Banana, MD Taking Active   tacrolimus (PROTOPIC) 0.1 % ointment [id number] Apply topically 2 (two) times daily. Bid aa rash on buttocks until clear then prn flares, avoid face, groin, axilla SBrendolyn Patty MD Taking Active   topiramate (TOPAMAX) 50 MG tablet [id number] Take 1 tablet by mouth twice daily  Patient taking differently: Take 50 mg by mouth 2 (two) times daily.   GJerrol Banana, MD Taking Active Self  traMADol (Veatrice Bourbon 50 MG tablet [id number] Take 1-2 tablets (50-100 mg total) by mouth every 6 (six) hours as needed for moderate pain. MLattie Corns PA-C Taking Active   triamcinolone cream (KENALOG) 0.1 % [id number] Apply 1 application topically 2 (two) times daily. Bid aa rash buttocks up to 2 weeks, then d/c SBrendolyn Patty MD Taking Active             Patient Active Problem List   Diagnosis Date Noted   Total knee replacement status 09/06/2020   Atrial fibrillation ([place] 05/12/2020   Urinary frequency 11/22/2019   Leukocytes in urine 11/22/2019   Skin tear of forearm without complication, initial encounter- left 11/22/2019   Wound cellulitis- right lower leg anterior  10/29/2019   Leg wound, right, sequela 10/29/2019   Chronic fatigue 11/29/2015   Benign prostatic hyperplasia 02/22/2015   Gastrointestinal ulcer    History of peptic ulcer    Gastritis    Hematochezia    Acute gastric ulcer    Melena 11/12/2014   Malignant neoplasm of skin 09/21/2014   Cataract 09/21/2014   Colon polyp 09/21/2014   CAFL (chronic airflow limitation) ([place] 09/21/2014   Diabetes ([place] 09/21/2014   DD (diverticular disease) 09/21/2014   Benign essential tremor 09/21/2014   Personal history of tobacco use, presenting hazards to  health 09/21/2014   Acid reflux 09/21/2014   Hemorrhoids 09/21/2014   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   HLD (hyperlipidemia) 09/21/2014   BP (high blood pressure) 09/21/2014   Hypothyroidism 09/21/2014   Lymphoma ([place]  09/21/2014   Malaise and fatigue 09/21/2014   Mononeuritis 09/21/2014   Muscle ache 09/21/2014   Neuropathy 09/21/2014   Numbness and tingling 09/21/2014   Arthritis, degenerative 09/21/2014   Awareness of heartbeats 09/21/2014   Borderline diabetes 09/21/2014   Acne erythematosa 09/21/2014   Rotator cuff syndrome 09/21/2014   Athlete's foot 09/21/2014   Has a tremor 09/21/2014   Stasis, venous 09/21/2014   Avitaminosis D 09/21/2014   Arthritis of knee, degenerative 11/16/2013   Bradycardia, sinus 12/31/2011   Left bundle branch block 12/31/2011   First degree AV block 12/31/2011   Edema of both legs 12/31/2011   Bradycardia 12/31/2011   Localized edema 12/31/2011   COPD UNSPECIFIED 11/09/2009   HYPERTENSION, BENIGN 10/03/2009   SLEEP APNEA 10/03/2009   DYSPNEA 10/03/2009   Other forms of dyspnea 10/03/2009   CAD, NATIVE VESSEL 05/04/2009   Carotid disease, bilateral ([place]) 05/04/2009   Disorder of artery or arteriole ([place]) 05/04/2009    Immunization History  Administered Date(s) Administered   Fluad Quad(high Dose 65+) 02/24/2019, 02/14/2020   Influenza Split 02/20/2011, 02/27/2012   Influenza, High Dose Seasonal PF 02/22/2014, 02/18/2015, 02/29/2016, 02/26/2017, 04/22/2018   Influenza,inj,Quad PF,6+ Mos 02/13/2013   Influenza-Unspecified 02/24/2013   PFIZER(Purple Top)SARS-COV-2 Vaccination 06/17/2019, 07/08/2019, 04/26/2020   Pneumococcal Conjugate-13 04/14/2014   Pneumococcal Polysaccharide-23 03/25/1997, 04/05/2003   Td 08/29/2003   Tdap 02/20/2011, 02/14/2020   Zoster Recombinat (Shingrix) 02/26/2017, 06/25/2017   Zoster, Live 05/12/2007    Conditions to be addressed/monitored:  Hypertension, Hyperlipidemia, Diabetes, Atrial Fibrillation,  Coronary Artery Disease, COPD, Hypothyroidism, and BPH  Care Plan : General Pharmacy (Adult)  Updates made by Germaine Pomfret, RPH since 12/11/2020 12:00 AM     Problem: Hypertension, Hyperlipidemia, Diabetes, Atrial Fibrillation, Coronary Artery Disease, COPD, Hypothyroidism, and BPH   Priority: High     Long-Range Goal: Patient-Specific Goal   Start Date: 12/11/2020  Expected End Date: 12/11/2021  This Visit's Progress: On track  Priority: High  Note:   Current Barriers:  No barriers noted   Pharmacist Clinical Goal(s):  Patient will maintain control of blood pressure as evidenced by BP less than 140/90  through collaboration with PharmD and provider.   Interventions: 1:1 collaboration with Jerrol Banana., MD regarding development and update of comprehensive plan of care as evidenced by provider attestation and co-signature Inter-disciplinary care team collaboration (see longitudinal plan of care) Comprehensive medication review performed; medication list updated in electronic medical record  Hypertension (BP goal <140/90) -Controlled -Current treatment: Doxazosin 8 mg nightly  Furosemide 40 mg 1/2 tablet daily  Quinapril 20 mg daily  -Medications previously tried: NA  -Current home readings: [phone] systolics -Current dietary habits: low sodium diet -Current exercise habits: Working with PT to do daily exercises and build knee strength -Denies hypotensive/hypertensive symptoms -Educated on Proper BP monitoring technique; -Counseled to monitor BP at home weekly, document, and provide log at future appointments -Recommended to continue current medication  Atrial Fibrillation (Goal: prevent stroke and major bleeding) -Controlled -CHADSVASC: 5 -Current treatment: Rate control: None Anticoagulation: Eliquis 5 mg twice daily -Medications previously tried: NA -Counseled on avoidance of NSAIDs due to increased bleeding risk with anticoagulants; -Recommended to  continue current medication  Hyperlipidemia: (LDL goal < 70) -Controlled -Current treatment: Rosuvastatin 20 mg 1/2 tablet nightly  -Medications previously tried: NA  -Educated on Importance of limiting foods high in cholesterol; -Recommended to continue current medication  Hypothyroidism (Goal: Maintain stable thyroid) -Controlled -Current treatment  Levothyroxine 75 mcg daily  -Medications  previously tried: NA  -Recommended to continue current medication  Chronic Kidney Disease Stage 3a  -All medications assessed for renal dosing and appropriateness in chronic kidney disease. -Will need to reduce Eliquis if creatinine > 1.5.  -Recommended to continue current medication   Patient Goals/Self-Care Activities Patient will:  - check blood pressure weekly, document, and provide at future appointments  Follow Up Plan: Telephone follow up appointment with care management team member scheduled for:  06/08/2021 at 1:00 PM      Medication Assistance: None required.  Patient affirms current coverage meets needs.  Compliance/Adherence/Medication fill history: Care Gaps: Foot exam, ophthalmology exam  Covid Booster   Star-Rating Drugs: Quinapril 20 mg last filled on 11/24/2020 for a 90-Day supply at [facility] Rosuvastatin 20 mg last filled on 11/24/2020 for a 90-Day supply at [facility]  Patient's preferred pharmacy is:  [facility] [address] [place] [address] Phone: [phone] Fax: [phone]  [facility] ([place], [place] [address] Phone: [phone] Fax: [phone]  Uses pill box? Yes Pt endorses 100% compliance  We discussed: Current pharmacy is preferred with insurance plan and patient is satisfied with pharmacy services Patient decided to: Continue current medication management  strategy  Care Plan and Follow Up Patient Decision:  Patient agrees to Care Plan and Follow-up.  Plan: Telephone follow up appointment with care management team member scheduled for:  06/08/2021 at 1:00 PM

## 2020-12-11 NOTE — Patient Instructions (Signed)
Visit Information It was great speaking with you today!  Please let me know if you have any questions about our visit.   Goals Addressed             This Visit's Progress    Track and Manage My Blood Pressure-Hypertension       Timeframe:  Long-Range Goal Priority:  High Start Date:  12/11/20                            Expected End Date: 12/11/21                       Follow Up Date 02/23/2021    - check blood pressure weekly    Why is this important?   You won't feel high blood pressure, but it can still hurt your blood vessels.  High blood pressure can cause heart or kidney problems. It can also cause a stroke.  Making lifestyle changes like losing a little weight or eating less salt will help.  Checking your blood pressure at home and at different times of the day can help to control blood pressure.  If the doctor prescribes medicine remember to take it the way the doctor ordered.  Call the office if you cannot afford the medicine or if there are questions about it.     Notes:         Patient Care Plan: General Pharmacy (Adult)     Problem Identified: Hypertension, Hyperlipidemia, Diabetes, Atrial Fibrillation, Coronary Artery Disease, COPD, Hypothyroidism, and BPH   Priority: High     Long-Range Goal: Patient-Specific Goal   Start Date: 12/11/2020  Expected End Date: 12/11/2021  This Visit's Progress: On track  Priority: High  Note:   Current Barriers:  No barriers noted   Pharmacist Clinical Goal(s):  Patient will maintain control of blood pressure as evidenced by BP less than 140/90  through collaboration with PharmD and provider.   Interventions: 1:1 collaboration with Jared Rice., MD regarding development and update of comprehensive plan of care as evidenced by provider attestation and co-signature Inter-disciplinary care team collaboration (see longitudinal plan of care) Comprehensive medication review performed; medication list updated in  electronic medical record  Hypertension (BP goal <140/90) -Controlled -Current treatment: Doxazosin 8 mg nightly  Furosemide 40 mg 1/2 tablet daily  Quinapril 20 mg daily  -Medications previously tried: NA  -Current home readings: 400-867Y systolics -Current dietary habits: low sodium diet -Current exercise habits: Working with PT to do daily exercises and build knee strength -Denies hypotensive/hypertensive symptoms -Educated on Proper BP monitoring technique; -Counseled to monitor BP at home weekly, document, and provide log at future appointments -Recommended to continue current medication  Atrial Fibrillation (Goal: prevent stroke and major bleeding) -Controlled -CHADSVASC: 5 -Current treatment: Rate control: None Anticoagulation: Eliquis 5 mg twice daily -Medications previously tried: NA -Counseled on avoidance of NSAIDs due to increased bleeding risk with anticoagulants; -Recommended to continue current medication  Hyperlipidemia: (LDL goal < 70) -Controlled -Current treatment: Rosuvastatin 20 mg 1/2 tablet nightly  -Medications previously tried: NA  -Educated on Importance of limiting foods high in cholesterol; -Recommended to continue current medication  Hypothyroidism (Goal: Maintain stable thyroid) -Controlled -Current treatment  Levothyroxine 75 mcg daily  -Medications previously tried: NA  -Recommended to continue current medication  Chronic Kidney Disease Stage 3a  -All medications assessed for renal dosing and appropriateness in chronic kidney disease. -Will need to  reduce Eliquis if creatinine > 1.5.  -Recommended to continue current medication   Patient Goals/Self-Care Activities Patient will:  - check blood pressure weekly, document, and provide at future appointments  Follow Up Plan: Telephone follow up appointment with care management team member scheduled for:  06/08/2021 at 1:00 PM    Patient agreed to services and verbal consent obtained.    The patient verbalized understanding of instructions, educational materials, and care plan provided today and declined offer to receive copy of patient instructions, educational materials, and care plan.   Jared Rice, PharmD, [place] [phone]

## 2020-12-19 ENCOUNTER — Telehealth: Payer: Self-pay

## 2020-12-19 NOTE — Telephone Encounter (Signed)
Refill of Skin Medicinals Cream sent in for intertrigo.

## 2020-12-27 ENCOUNTER — Encounter: Payer: Self-pay | Admitting: Dermatology

## 2020-12-27 ENCOUNTER — Other Ambulatory Visit: Payer: Self-pay

## 2020-12-27 ENCOUNTER — Ambulatory Visit: Payer: PPO | Admitting: Dermatology

## 2020-12-27 DIAGNOSIS — L98491 Non-pressure chronic ulcer of skin of other sites limited to breakdown of skin: Secondary | ICD-10-CM | POA: Diagnosis not present

## 2020-12-27 DIAGNOSIS — R234 Changes in skin texture: Secondary | ICD-10-CM

## 2020-12-27 DIAGNOSIS — L304 Erythema intertrigo: Secondary | ICD-10-CM | POA: Diagnosis not present

## 2020-12-27 NOTE — Patient Instructions (Signed)

## 2020-12-27 NOTE — Progress Notes (Signed)
   Follow-Up Visit   Subjective  Jared Rice is a 87 y.o. male who presents for the following: intertrigo  (Of the lower abdominal area and groin. Patient currently using Skin Medicinal mix BID PRN flares.) and ulceration (Of the R medial buttocks - patient currently using Tacrolimus but continues to have ulcerations. ).  The following portions of the chart were reviewed this encounter and updated as appropriate:   Tobacco  Allergies  Meds  Problems  Med Hx  Surg Hx  Fam Hx     Review of Systems:  No other skin or systemic complaints except as noted in HPI or Assessment and Plan.  Objective  Well appearing patient in no apparent distress; mood and affect are within normal limits.  A focused examination was performed including the buttocks. Relevant physical exam findings are noted in the Assessment and Plan.  Groin, infra abdominal Mild pinkness of the groin and infra abdominal area.   Buttocks Grey pink plaques with crusting of the B/L med buttocks.    Assessment & Plan  Erythema intertrigo Groin, infra abdominal Start Zeasorb AF powder daily when clear and  continue Skin Medicinals mix BID PRN flares.  Intertrigo is a chronic recurrent rash that occurs in skin fold areas that may be associated with friction; heat; moisture; yeast; fungus; and bacteria.  It is exacerbated by increased movement / activity; sweating; and higher atmospheric temperature.  Related Medications ketoconazole (NIZORAL) 2 % cream Apply 1 application topically as directed. Qd-bid  Non-pressure chronic ulcer of skin of other sites limited to breakdown of skin ([place]) - with ulcers and crusts. Buttocks Due to pressure from sitting -  Chronic and persistent. Recommend using C shaped pillow to avoid pressure while sitting. Continue Tacrolimus ointment QD-BID.  Encourage walking.  Return in about 6 months (around 06/29/2021) for TBSE.  Jared Rice, CMA, am acting as scribe for Sarina Ser, MD  .  Documentation: I have reviewed the above documentation for accuracy and completeness, and I agree with the above.  Sarina Ser, MD

## 2020-12-28 DIAGNOSIS — R197 Diarrhea, unspecified: Secondary | ICD-10-CM | POA: Diagnosis not present

## 2021-01-02 DIAGNOSIS — Z8719 Personal history of other diseases of the digestive system: Secondary | ICD-10-CM | POA: Diagnosis not present

## 2021-01-02 DIAGNOSIS — Z09 Encounter for follow-up examination after completed treatment for conditions other than malignant neoplasm: Secondary | ICD-10-CM | POA: Diagnosis not present

## 2021-01-06 ENCOUNTER — Other Ambulatory Visit
Admission: RE | Admit: 2021-01-06 | Discharge: 2021-01-06 | Disposition: A | Discharge status: Home / Self Care | Payer: PPO | Source: Ambulatory Visit | Attending: Nurse Practitioner | Admitting: Nurse Practitioner

## 2021-01-06 DIAGNOSIS — R6889 Other general symptoms and signs: Secondary | ICD-10-CM | POA: Insufficient documentation

## 2021-01-08 LAB — URINE CULTURE
Culture: <10000 — AB
Special Requests: NORMAL

## 2021-01-10 ENCOUNTER — Telehealth: Payer: Self-pay

## 2021-01-10 DIAGNOSIS — I10 Essential (primary) hypertension: Secondary | ICD-10-CM

## 2021-01-10 NOTE — Progress Notes (Signed)
Chronic Care Management Pharmacy Assistant   Name: Jared Rice  MRN: [id number] DOB: December 31, 1932  Reason for Encounter: Hypertension and PHQ9 Disease State Call/Eliquis Application Status   Recent office visits:  None ID  Recent consult visits:  08/0/2022 Fortunato Curling, MD (Dermatology) for [facility] AF powder daily- Return in 6 months  12/08/2020 Skip Estimable, MD (Orthopedic Surgery) for Post Op visit- No medication changes noted; patient requested to follow-up in 9 months.  Hospital visits:  None in previous 6 months  Medications: Outpatient Encounter Medications as of 01/10/2021  Medication Sig   acetaminophen (TYLENOL) 500 MG tablet Take 1,000 mg by mouth every 6 (six) hours as needed for moderate pain.   apixaban (ELIQUIS) 5 MG TABS tablet Take 1 tablet (5 mg total) by mouth 2 (two) times daily.   diclofenac Sodium (VOLTAREN) 1 % GEL Apply 2 g topically 4 (four) times daily as needed (knee pain).   diphenhydramine-acetaminophen (TYLENOL PM) 25-500 MG TABS Take 2 tablets by mouth at bedtime.   doxazosin (CARDURA) 8 MG tablet Take 1 tablet (8 mg total) by mouth daily. (Patient taking differently: Take 8 mg by mouth at bedtime.)   furosemide (LASIX) 40 MG tablet Take 1 tablet by mouth once daily (dose change)   ketoconazole (NIZORAL) 2 % cream Apply 1 application topically as directed. Qd-bid   levothyroxine (SYNTHROID) 75 MCG tablet Take 1 tablet (75 mcg total) by mouth daily before breakfast. Take 1 tablet by mouth daily in the morning   mupirocin ointment (BACTROBAN) 2 % Apply 1 application topically daily. And cover with band aid   quinapril (ACCUPRIL) 20 MG tablet Take 1 tablet (20 mg total) by mouth daily. (Patient taking differently: Take 20 mg by mouth at bedtime.)   rosuvastatin (CRESTOR) 20 MG tablet Patient takes '10mg'$  (Patient taking differently: Take 10 mg by mouth at bedtime.)   tacrolimus (PROTOPIC) 0.1 % ointment Apply topically 2 (two) times  daily. Bid aa rash on buttocks until clear then prn flares, avoid face, groin, axilla   topiramate (TOPAMAX) 50 MG tablet Take 1 tablet by mouth twice daily (Patient taking differently: Take 50 mg by mouth 2 (two) times daily.)   traMADol (ULTRAM) 50 MG tablet Take 1-2 tablets (50-100 mg total) by mouth every 6 (six) hours as needed for moderate pain.   No facility-administered encounter medications on file as of 01/10/2021.   Care Gaps: Foot Exam Ophthalmology Exam (last completed 04/27/2019 COVID-19 Vaccine Booster 4 Influenza Vaccine (Last completed 02/14/2020) Hemoglobin A1C (last completed 07/05/2020)  Star Rating Drugs: Quinapril 20 mg last filled on 11/24/2020 for a 90-Day supply with [facility] Rosuvastatin 20 mg 11/24/2020 for a 90-Day supply with [facility]  Reviewed chart prior to disease state call. Spoke with patient regarding BP  Recent Office Vitals: BP Readings from Last 3 Encounters:  11/14/20 114/68  09/07/20 109/72  08/24/20 102/68   Pulse Readings from Last 3 Encounters:  11/14/20 70  09/07/20 68  08/24/20 85    Wt Readings from Last 3 Encounters:  11/14/20 248 lb 3.2 oz (112.6 kg)  09/06/20 257 lb 8 oz (116.8 kg)  08/24/20 257 lb 8 oz (116.8 kg)     Kidney Function Lab Results  Component Value Date/Time   CREATININE 1.33 (H) 11/03/2020 01:35 PM   CREATININE 1.12 08/24/2020 12:18 PM   CREATININE 0.87 10/13/2013 10:55 AM   GFRNONAA >60 08/24/2020 12:18 PM   GFRNONAA >60 10/13/2013 10:55 AM  GFRAA 74 07/05/2020 10:31 AM   GFRAA >60 10/13/2013 10:55 AM    BMP Latest Ref Rng & Units 11/03/2020 08/24/2020 07/05/2020  Glucose 65 - 99 mg/dL 100(H) 90 102(H)  BUN 8 - 27 mg/dL '18 16 17  '$ Creatinine 0.76 - 1.27 mg/dL 1.33(H) 1.12 1.05  BUN/Creat Ratio 10 - 24 14 - 16  Sodium 134 - 144 mmol/L 142 141 144  Potassium 3.5 - 5.2 mmol/L 4.6 4.0 4.3  Chloride 96 - 106 mmol/L 106 107 107(H)  CO2 20 - 29 mmol/L '25 25 23  '$ Calcium 8.6  - 10.2 mg/dL 8.7 8.6(L) 8.8    Current antihypertensive regimen:  Doxazosin 8 mg nightly  Furosemide 40 mg 1/2 tablet daily  Quinapril 20 mg daily   How often are you checking your Blood Pressure?  Patient stated that he does not have a monitor at home, but he has no reason to think his blood pressure has not been normal. I did advise him I would reach out to [facility] to see if we can get one ordered for him through his insurance    Current home BP readings: No numbers available   What recent interventions/DTPs have been made by any provider to improve Blood Pressure control since last CPP Visit: None ID  Any recent hospitalizations or ED visits since last visit with CPP? No  What diet changes have been made to improve Blood Pressure Control?  Patient reports that he is eating okay no certain diet. Patient stated that he has been dealing with diarrhea and he is taking Imodium, and started on Probiotics as that is what he was advised to do by the nurse line that he called that is connected to his insurance.  What exercise is being done to improve your Blood Pressure Control?  Patient states he tries to get a little exercise in when he can  Adherence Review: Is the patient currently on ACE/ARB medication? Yes Does the patient have >5 day gap between last estimated fill dates? No   PHQ9 Questions  Little interest or pleasure in doing things?  Not at all 0 Several days +1 More than half the days +2 Nearly every day +3  Feeling down, depressed, or hopeless?  Not at all 0 Several days +1 More than half the days +2 Nearly every day +3  Trouble falling or staying asleep, or sleeping too much?  Not at all 0 Several days +1 More than half the days +2 Nearly every day +3  Feeling tired or having little energy?  Not at all 0 Several days +1 More than half the days +2 Nearly every day +3 Poor appetite or overeating? Not at all 0 Several days +1 More than half the days  +2 Nearly every day +3  Feeling bad about yourself -- or that you are a failure or have let yourself or your family down?  Not at all 0 Several days+1 More than half the days+2 Nearly every day+3  Trouble concentrating on things, such as reading the newspaper or watching television?  Not at all 0 Several days +1 More than half the days +2 Nearly every day +3  Moving or speaking so slowly that other people could have noticed? Or so fidgety or restless that you have been moving a lot more than usual?  Not at all 0 Several days +1 More than half the days +2 Nearly every day +3  Thoughts that you would be better off dead, or thoughts of hurting yourself  in some way?  Not at all 0 Several days +1 More than half the days +2 Nearly every day +3  06/08/2021 @ 1300 appointment with Junius Argyle, CPP  AWV completed 05/31/2020  Lynann Bologna, CPA/CMA Clinical Pharmacist Assistant Phone: [phone]

## 2021-01-11 DIAGNOSIS — Z87448 Personal history of other diseases of urinary system: Secondary | ICD-10-CM | POA: Diagnosis not present

## 2021-01-11 DIAGNOSIS — Z09 Encounter for follow-up examination after completed treatment for conditions other than malignant neoplasm: Secondary | ICD-10-CM | POA: Diagnosis not present

## 2021-01-15 ENCOUNTER — Telehealth: Payer: PPO

## 2021-01-15 ENCOUNTER — Encounter: Payer: Self-pay | Admitting: Family Medicine

## 2021-01-15 ENCOUNTER — Telehealth (INDEPENDENT_AMBULATORY_CARE_PROVIDER_SITE_OTHER): Payer: PPO | Admitting: Family Medicine

## 2021-01-15 ENCOUNTER — Telehealth: Payer: Self-pay

## 2021-01-15 ENCOUNTER — Ambulatory Visit: Payer: Self-pay

## 2021-01-15 DIAGNOSIS — R059 Cough, unspecified: Secondary | ICD-10-CM | POA: Diagnosis not present

## 2021-01-15 DIAGNOSIS — R197 Diarrhea, unspecified: Secondary | ICD-10-CM

## 2021-01-15 NOTE — Telephone Encounter (Signed)
Returned call to patient. Scheduled patient for phone visit today at 1:00 pm.

## 2021-01-15 NOTE — Telephone Encounter (Signed)
Patient was seen today via virtual visit with Dr. Rosanna Randy. Patient called back after appointment to let Dr. Rosanna Randy know the results of his COVID test.

## 2021-01-15 NOTE — Telephone Encounter (Signed)
Copied from [place] [id number]. Topic: General - Other >> Jan 15, 2021  4:26 PM Yvette Rack wrote: Reason for CRM: Pt reports that he tested positive for Covid and wanted to make Dr. Rosanna Randy aware.

## 2021-01-15 NOTE — Telephone Encounter (Signed)
Copied from [place] [id number]. Topic: Appointment Scheduling - Scheduling Inquiry for Clinic >> Jan 15, 2021  8:05 AM Oneta Rack wrote: Patient requesting a work in with PCP for today, patient experiencing congestion and diarrhea for 1 week, low grade fever.

## 2021-01-15 NOTE — Telephone Encounter (Signed)
Pt c/o sore throat and productive cough that started 3-4 days ago.[name] Pt phlegm from chest is green in color. Afebrile, denies SOB or chest pain.Pt stated that he has not had a covid exposure.  Pt has had diarrhea for a week. Pt stated is it watery. Advised pt to push water and drink warm Tea to help with chest congestion. Pt has h/o atrial fib and bronchitis. Pt is fully vaccinated including booster shot [employer]).  Assisted pt with address ad phone number of a local UC.  Pt advised to get a humidifier and a home test. Advised pt to go to UC for green phlegm.Care advice given and pt verbalized understanding.     Reason for Disposition  MILD difficulty breathing (e.g., minimal/no SOB at rest, SOB with walking, pulse <100)  Answer Assessment - Initial Assessment Questions 1. COVID-19 DIAGNOSIS: "Who made your COVID-19 diagnosis?" "Was it confirmed by a positive lab test or self-test?" If not diagnosed by a doctor (or NP/PA), ask "Are there lots of cases (community spread) where you live?" Note: See public health department website, if unsure.     No one- yes 2. COVID-19 EXPOSURE: "Was there any known exposure to COVID before the symptoms began?" CDC Definition of close contact: within 6 feet (2 meters) for a total of 15 minutes or more over a 24-hour period.      no 3. ONSET: "When did the COVID-19 symptoms start?"    3 -4 days ago 4. WORST SYMPTOM: "What is your worst symptom?" (e.g., cough, fever, shortness of breath, muscle aches)     Cough tryng to get phlegm up 5. COUGH: "Do you have a cough?" If Yes, ask: "How bad is the cough?"       Occasionally when he feels chest congestion 6. FEVER: "Do you have a fever?" If Yes, ask: "What is your temperature, how was it measured, and when did it start?"     no 7. RESPIRATORY STATUS: "Describe your breathing?" (e.g., shortness of breath, wheezing, unable to speak)      No SOB, wheezing 8. BETTER-SAME-WORSE: "Are you getting better, staying the same  or getting worse compared to yesterday?"  If getting worse, ask, "In what way?"     same 9. HIGH RISK DISEASE: "Do you have any chronic medical problems?" (e.g., asthma, heart or lung disease, weak immune system, obesity, etc.)     Bronchits, a fib on Eliquis high risk 10. VACCINE: "Have you had the COVID-19 vaccine?" If Yes, ask: "Which one, how many shots, when did you get it?"       Yes- 2 shot- last year 11. BOOSTER: "Have you received your COVID-19 booster?" If Yes, ask: "Which one and when did you get it?"       Yes fully vaccinated-Pfizer 13. OTHER SYMPTOMS: "Do you have any other symptoms?"  (e.g., chills, fatigue, headache, loss of smell or taste, muscle pain, sore throat)       Diarrhea (watery)-mild, sore throat- chest congestion coughing up green 14. O2 SATURATION MONITOR:  "Do you use an oxygen saturation monitor (pulse oximeter) at home?" If Yes, ask "What is your reading (oxygen level) today?" "What is your usual oxygen saturation reading?" (e.g., 95%)       no  Protocols used: Coronavirus (COVID-19) Diagnosed or Suspected-A-AH

## 2021-01-15 NOTE — Progress Notes (Signed)
Virtual telephone visit    Virtual Visit via Telephone Note   This visit type was conducted due to national recommendations for restrictions regarding the COVID-19 Pandemic (e.g. social distancing) in an effort to limit this patient's exposure and mitigate transmission in our community. Due to his co-morbid illnesses, this patient is at least at moderate risk for complications without adequate follow up. This format is felt to be most appropriate for this patient at this time. The patient did not have access to video technology or had technical difficulties with video requiring transitioning to audio format only (telephone). Physical exam was limited to content and character of the telephone converstion.    Patient location: Home Provider location: Office  I discussed the limitations of evaluation and management by telemedicine and the availability of in person appointments. The patient expressed understanding and agreed to proceed.  This is a telephone call as patient was unable to connect virtually. Visit Date: 01/15/2021  Today's healthcare provider: Wilhemena Durie, MD   No chief complaint on file.  Subjective    HPI  Patient complains of diarrhea for 7 to 10 days.  He has had no abdominal pain and he has had no previous abdominal surgeries.  No known fevers. He has been taking Imodium and over-the-counter probiotics.  He occasionally has fecal incontinence.  No blood in his stool. In the past 4 to 5 days he has developed a cough productive of clear phlegm.  No shortness of breath.  He is not been diagnosed with COVID but is suspicious.  He has had COVID vaccines.       Medications: Outpatient Medications Prior to Visit  Medication Sig   acetaminophen (TYLENOL) 500 MG tablet Take 1,000 mg by mouth every 6 (six) hours as needed for moderate pain.   apixaban (ELIQUIS) 5 MG TABS tablet Take 1 tablet (5 mg total) by mouth 2 (two) times daily.   diclofenac Sodium (VOLTAREN)  1 % GEL Apply 2 g topically 4 (four) times daily as needed (knee pain).   diphenhydramine-acetaminophen (TYLENOL PM) 25-500 MG TABS Take 2 tablets by mouth at bedtime.   doxazosin (CARDURA) 8 MG tablet Take 1 tablet (8 mg total) by mouth daily. (Patient taking differently: Take 8 mg by mouth at bedtime.)   furosemide (LASIX) 40 MG tablet Take 1 tablet by mouth once daily (dose change)   levothyroxine (SYNTHROID) 75 MCG tablet Take 1 tablet (75 mcg total) by mouth daily before breakfast. Take 1 tablet by mouth daily in the morning   mupirocin ointment (BACTROBAN) 2 % Apply 1 application topically daily. And cover with band aid   quinapril (ACCUPRIL) 20 MG tablet Take 1 tablet (20 mg total) by mouth daily. (Patient taking differently: Take 20 mg by mouth at bedtime.)   rosuvastatin (CRESTOR) 20 MG tablet Patient takes 32m (Patient taking differently: Take 10 mg by mouth at bedtime.)   tacrolimus (PROTOPIC) 0.1 % ointment Apply topically 2 (two) times daily. Bid aa rash on buttocks until clear then prn flares, avoid face, groin, axilla   topiramate (TOPAMAX) 50 MG tablet Take 1 tablet by mouth twice daily (Patient taking differently: Take 50 mg by mouth 2 (two) times daily.)   traMADol (ULTRAM) 50 MG tablet Take 1-2 tablets (50-100 mg total) by mouth every 6 (six) hours as needed for moderate pain.   No facility-administered medications prior to visit.    Review of Systems  Constitutional:  Negative for appetite change, chills and fever.  Respiratory:  Negative for chest tightness, shortness of breath and wheezing.   Cardiovascular:  Negative for chest pain and palpitations.  Gastrointestinal:  Negative for abdominal pain, nausea and vomiting.      Objective    There were no vitals taken for this visit.      Assessment & Plan     1. Diarrhea, unspecified type After discussion with patient will continue to push fluids and if he does not improve in the near future we will obtain lab work  in the form of CBC met C and lipase and x-rays in the form of KUB and chest x-ray.  He has had no previous abdominal surgeries of the small bowel obstruction is unlikely.  Certainly at 54 he has not had screening done for colon in recent years so it is reasonable to consider all possible options if he does not improve.  At this time push fluids and try probiotics twice a day.  2. Cough Due to the cough we will have patient go to [facility] to get COVID PCR testing done.  He is well past the point of any treatment options.  Again consider KUB and chest x-ray if he persists with symptoms   No follow-ups on file.    I discussed the assessment and treatment plan with the patient. The patient was provided an opportunity to ask questions and all were answered. The patient agreed with the plan and demonstrated an understanding of the instructions.   The patient was advised to call back or seek an in-person evaluation if the symptoms worsen or if the condition fails to improve as anticipated.  I provided 11 minutes of non-face-to-face time during this encounter.  I, Wilhemena Durie, MD, have reviewed all documentation for this visit. The documentation on 01/15/21 for the exam, diagnosis, procedures, and orders are all accurate and complete.   Hanna Ra Cranford Mon, MD [facility] [phone] (phone) [phone] (fax)  [place]

## 2021-01-16 ENCOUNTER — Telehealth: Payer: Self-pay

## 2021-01-16 ENCOUNTER — Telehealth: Payer: Self-pay | Admitting: Family Medicine

## 2021-01-16 NOTE — Telephone Encounter (Signed)
Advised patient as below.  

## 2021-01-16 NOTE — Telephone Encounter (Signed)
Advised patient of Dr Darnell Level recommendations in another message.

## 2021-01-16 NOTE — Telephone Encounter (Signed)
See previous note

## 2021-01-16 NOTE — Telephone Encounter (Signed)
Copied from [place] [id number]. Topic: General - Other >> Jan 16, 2021  1:14 PM Yvette Rack wrote: Reason for CRM: Pt stated he requested a Rx for medication for Covid and would like to know when the Rx will be sent to his pharmacy. Pt asked that a message be sent to [facility] asking that she contact him with an update on the Rx request.

## 2021-01-16 NOTE — Telephone Encounter (Signed)
Pt states after conversation yesterday,he went and had a covid test. Pt states it was positive.  Pt wants to know if Dr Rosanna Randy wants to prescribe anything for him? Please send to [place], [place]  In addition, pt's wife is going to get tested this am, and she is also pt of Dr Rosanna Randy.

## 2021-01-22 ENCOUNTER — Telehealth: Payer: PPO | Admitting: Family Medicine

## 2021-01-23 ENCOUNTER — Telehealth: Payer: Self-pay

## 2021-01-23 NOTE — Telephone Encounter (Signed)
[facility] refills sent in for intertrigo.

## 2021-01-24 ENCOUNTER — Telehealth: Payer: Self-pay | Admitting: Family Medicine

## 2021-01-24 DIAGNOSIS — E039 Hypothyroidism, unspecified: Secondary | ICD-10-CM

## 2021-01-24 MED ORDER — LEVOTHYROXINE SODIUM 75 MCG PO TABS: 75.0000 ug | ORAL_TABLET | Freq: Every day | ORAL | 1 refills | Status: DC

## 2021-01-24 NOTE — Telephone Encounter (Signed)
[facility] faxed refill request for the following medications:  levothyroxine (SYNTHROID) 75 MCG tablet    Please advise.

## 2021-01-25 ENCOUNTER — Other Ambulatory Visit: Payer: Self-pay | Admitting: Family Medicine

## 2021-01-25 MED ORDER — DOXAZOSIN MESYLATE 8 MG PO TABS: 8.0000 mg | ORAL_TABLET | Freq: Every day | ORAL | 3 refills | Status: DC

## 2021-01-25 NOTE — Telephone Encounter (Signed)
[facility] faxed refill request for the following medications:   doxazosin (CARDURA) 8 MG tablet   Please advise.

## 2021-01-25 NOTE — Telephone Encounter (Signed)
Requested medication (s) are due for refill today:yes  Requested medication (s) are on the active medication list: yes  Last refill: 07/13/20  #180  1 refill  Future visit scheduled yes with Nurse, 06/05/21  Notes to clinic: not delegated  Requested Prescriptions  Pending Prescriptions Disp Refills   topiramate (TOPAMAX) 50 MG tablet [Pharmacy Med Name: Topiramate 50 MG Oral Tablet] 180 tablet 0    Sig: Take 1 tablet by mouth twice daily     Not Delegated - Neurology: Anticonvulsants - topiramate & zonisamide Failed - 01/25/2021  8:33 AM      Failed - This refill cannot be delegated      Failed - Cr in normal range and within 360 days    Creatinine  Date Value Ref Range Status  10/13/2013 0.87 0.60 - 1.30 mg/dL Final   Creatinine, Ser  Date Value Ref Range Status  11/03/2020 1.33 (H) 0.76 - 1.27 mg/dL Final          Passed - CO2 in normal range and within 360 days    CO2  Date Value Ref Range Status  11/03/2020 25 20 - 29 mmol/L Final   Co2  Date Value Ref Range Status  10/13/2013 30 21 - 32 mmol/L Final          Passed - Valid encounter within last 12 months    Recent Outpatient Visits           1 week ago Diarrhea, unspecified type   [facility] Jerrol Banana., MD   7 months ago Essential hypertension   [facility] Jerrol Banana., MD   9 months ago Frequent urination   [place], [place]   10 months ago Urinary frequency   [place], [place]   11 months ago Wound cellulitis- right lower leg anterior    [facility] Jerrol Banana., MD       Future Appointments             In 5 months Nehemiah Massed Monia Sabal, MD [place]

## 2021-01-25 NOTE — Addendum Note (Signed)
Addended by: Julieta Bellini on: 01/25/2021 04:41 PM   Modules accepted: Orders

## 2021-02-01 MED ORDER — BLOOD PRESSURE KIT: PACK | 0 refills | Status: DC

## 2021-02-01 NOTE — Addendum Note (Signed)
Addended by: Daron Offer A on: 02/01/2021 08:26 AM   Modules accepted: Orders

## 2021-02-02 DIAGNOSIS — Z20822 Contact with and (suspected) exposure to covid-19: Secondary | ICD-10-CM | POA: Diagnosis not present

## 2021-02-02 DIAGNOSIS — Z03818 Encounter for observation for suspected exposure to other biological agents ruled out: Secondary | ICD-10-CM | POA: Diagnosis not present

## 2021-02-05 ENCOUNTER — Telehealth: Payer: Self-pay

## 2021-02-05 NOTE — Telephone Encounter (Signed)
Patient states that he spoke with Jared Rice in front office and she scheduled him appt to be evaluated by Dr.Gilbert on 02/06/21. KW

## 2021-02-05 NOTE — Telephone Encounter (Signed)
Copied from [place] [id number]. Topic: Appointment Scheduling - Scheduling Inquiry for Clinic >> Feb 05, 2021  9:09 AM Tessa Lerner A wrote: Reason for CRM: Patient would like to be seen by [facility]. Rollene Rotunda for stomach discomfort and concerns related to ongoing diarrhea   Please contact further when possible

## 2021-02-05 NOTE — Telephone Encounter (Signed)
Copied from [place] [id number]. Topic: Appointment Scheduling - Scheduling Inquiry for Clinic >> Feb 05, 2021  9:09 AM Tessa Lerner A wrote: Reason for CRM: Patient would like to be seen by [facility]. Rollene Rotunda for stomach discomfort and concerns related to ongoing diarrhea   Please contact further when possible

## 2021-02-06 ENCOUNTER — Ambulatory Visit (INDEPENDENT_AMBULATORY_CARE_PROVIDER_SITE_OTHER): Payer: PPO | Admitting: Family Medicine

## 2021-02-06 ENCOUNTER — Other Ambulatory Visit: Payer: Self-pay

## 2021-02-06 VITALS — BP 125/79 | HR 74 | Temp 98.6°F | Resp 16 | Wt 244.0 lb

## 2021-02-06 DIAGNOSIS — E6609 Other obesity due to excess calories: Secondary | ICD-10-CM | POA: Diagnosis not present

## 2021-02-06 DIAGNOSIS — I25118 Atherosclerotic heart disease of native coronary artery with other forms of angina pectoris: Secondary | ICD-10-CM

## 2021-02-06 DIAGNOSIS — G629 Polyneuropathy, unspecified: Secondary | ICD-10-CM | POA: Diagnosis not present

## 2021-02-06 DIAGNOSIS — I10 Essential (primary) hypertension: Secondary | ICD-10-CM

## 2021-02-06 DIAGNOSIS — Z6833 Body mass index (BMI) 33.0-33.9, adult: Secondary | ICD-10-CM | POA: Diagnosis not present

## 2021-02-06 DIAGNOSIS — R197 Diarrhea, unspecified: Secondary | ICD-10-CM

## 2021-02-06 DIAGNOSIS — E66811 Obesity, class 1: Secondary | ICD-10-CM

## 2021-02-06 DIAGNOSIS — R1084 Generalized abdominal pain: Secondary | ICD-10-CM | POA: Diagnosis not present

## 2021-02-06 DIAGNOSIS — I4891 Unspecified atrial fibrillation: Secondary | ICD-10-CM

## 2021-02-06 NOTE — Patient Instructions (Signed)
Try probiotic twice daily.  Try gatorade once daily.   Stop Vitamin D and Zinc

## 2021-02-06 NOTE — Progress Notes (Signed)
Established patient visit   Patient: Jared Rice   DOB: [birth year]   87 y.o. Male  MRN: [id number] Visit Date: 02/06/2021  Today's healthcare provider: Wilhemena Durie, MD   Chief Complaint  Patient presents with   Diarrhea   Abdominal Pain   Subjective    Diarrhea  This is a new problem. The current episode started 1 to 4 weeks ago (3 weeks). The problem has been gradually worsening. Associated symptoms include abdominal pain and increased flatus. Pertinent negatives include no fever or vomiting.  Abdominal Pain The onset quality is gradual. The problem has been gradually worsening. The pain is mild. The quality of the pain is aching and a sensation of fullness. The abdominal pain does not radiate. Associated symptoms include diarrhea and flatus. Pertinent negatives include no fever, hematochezia, hematuria, melena or vomiting. He has tried nothing for the symptoms. The treatment provided no relief.   Patient has COVID 3 weeks ago but has had this abdominal and GI complaints for longer than that.  Has had no fever or is only lost 4 pounds in 3 months.   Medications: Outpatient Medications Prior to Visit  Medication Sig   acetaminophen (TYLENOL) 500 MG tablet Take 1,000 mg by mouth every 6 (six) hours as needed for moderate pain.   apixaban (ELIQUIS) 5 MG TABS tablet Take 1 tablet (5 mg total) by mouth 2 (two) times daily.   Blood Pressure KIT Use to monitor blood pressure as directed   diclofenac Sodium (VOLTAREN) 1 % GEL Apply 2 g topically 4 (four) times daily as needed (knee pain).   diphenhydramine-acetaminophen (TYLENOL PM) 25-500 MG TABS Take 2 tablets by mouth at bedtime.   doxazosin (CARDURA) 8 MG tablet Take 1 tablet (8 mg total) by mouth daily.   furosemide (LASIX) 40 MG tablet Take 1 tablet by mouth once daily (dose change)   levothyroxine (SYNTHROID) 75 MCG tablet Take 1 tablet (75 mcg total) by mouth daily before breakfast. Take 1 tablet by mouth daily in  the morning   mupirocin ointment (BACTROBAN) 2 % Apply 1 application topically daily. And cover with band aid   quinapril (ACCUPRIL) 20 MG tablet Take 1 tablet (20 mg total) by mouth daily. (Patient taking differently: Take 20 mg by mouth at bedtime.)   rosuvastatin (CRESTOR) 20 MG tablet Patient takes 43m (Patient taking differently: Take 10 mg by mouth at bedtime.)   tacrolimus (PROTOPIC) 0.1 % ointment Apply topically 2 (two) times daily. Bid aa rash on buttocks until clear then prn flares, avoid face, groin, axilla   topiramate (TOPAMAX) 50 MG tablet Take 1 tablet by mouth twice daily   traMADol (ULTRAM) 50 MG tablet Take 1-2 tablets (50-100 mg total) by mouth every 6 (six) hours as needed for moderate pain.   No facility-administered medications prior to visit.    Review of Systems  Constitutional:  Negative for fever.  Gastrointestinal:  Positive for abdominal pain, diarrhea and flatus. Negative for hematochezia, melena and vomiting.  Genitourinary:  Negative for hematuria.       Objective    BP 125/79   Pulse 74   Temp 98.6 F (37 C)   Resp 16   Wt 244 lb (110.7 kg)   BMI 32.19 kg/m   BP Readings from Last 3 Encounters:  02/06/21 125/79  11/14/20 114/68  09/07/20 109/72   Wt Readings from Last 3 Encounters:  02/06/21 244 lb (110.7 kg)  11/14/20 248 lb 3.2 oz (112.6  kg)  09/06/20 257 lb 8 oz (116.8 kg)      Physical Exam Vitals reviewed.  Constitutional:      Appearance: Normal appearance.  HENT:     Head: Normocephalic and atraumatic.     Right Ear: External ear normal.     Left Ear: External ear normal.  Eyes:     General: No scleral icterus.    Conjunctiva/sclera: Conjunctivae normal.  Cardiovascular:     Rate and Rhythm: Normal rate and regular rhythm.     Pulses: Normal pulses.     Heart sounds: Normal heart sounds.  Pulmonary:     Effort: Pulmonary effort is normal.     Breath sounds: Normal breath sounds.  Musculoskeletal:     Comments: He has  1+ lower extremity edema  Skin:    General: Skin is warm and dry.  Neurological:     General: No focal deficit present.     Mental Status: He is alert and oriented to person, place, and time.  Psychiatric:        Mood and Affect: Mood normal.        Behavior: Behavior normal.        Thought Content: Thought content normal.        Judgment: Judgment normal.      No results found for any visits on 02/06/21.  Assessment & Plan     1. Generalized abdominal pain At this time will obtain general abdominal ultrasound and lab work.  We will try probiotic twice a day and follow-up vitamins and over-the-counter medications. - Lipase - CBC with Differential/Platelet - Comprehensive metabolic panel  2. Diarrhea, unspecified type Push Gatorade for his dehydration.  It is mild.  Follow-up in 1 week. - Lipase - CBC with Differential/Platelet - Comprehensive metabolic panel  3. Neuropathy   4. Class 1 obesity due to excess calories with serious comorbidity and body mass index (BMI) of 33.0 to 33.9 in adult Follow-up weight next week.  5. Essential hypertension   6. Atrial fibrillation, unspecified type ([place])   7. Atherosclerosis of native coronary artery of native heart with stable angina pectoris ([place]) All risk factors treated.   No follow-ups on file.      I, Wilhemena Durie, MD, have reviewed all documentation for this visit. The documentation on 02/09/21 for the exam, diagnosis, procedures, and orders are all accurate and complete.    Jesselle Laflamme Cranford Mon, MD  [facility] [phone] (phone) [phone] (fax)  [place]

## 2021-02-07 DIAGNOSIS — R1084 Generalized abdominal pain: Secondary | ICD-10-CM | POA: Diagnosis not present

## 2021-02-07 DIAGNOSIS — R197 Diarrhea, unspecified: Secondary | ICD-10-CM | POA: Diagnosis not present

## 2021-02-08 LAB — COMPREHENSIVE METABOLIC PANEL
ALT: 6 IU/L (ref 0–44)
AST: 12 IU/L (ref 0–40)
Albumin/Globulin Ratio: 1.9 (ref 1.2–2.2)
Albumin: 3.7 g/dL (ref 3.6–4.6)
Alkaline Phosphatase: 71 IU/L (ref 44–121)
BUN/Creatinine Ratio: 19 (ref 10–24)
BUN: 20 mg/dL (ref 8–27)
Bilirubin Total: 0.5 mg/dL (ref 0.0–1.2)
CO2: 24 mmol/L (ref 20–29)
Calcium: 8.6 mg/dL (ref 8.6–10.2)
Chloride: 103 mmol/L (ref 96–106)
Creatinine, Ser: 1.05 mg/dL (ref 0.76–1.27)
Globulin, Total: 2 g/dL (ref 1.5–4.5)
Glucose: 89 mg/dL (ref 65–99)
Potassium: 4.3 mmol/L (ref 3.5–5.2)
Sodium: 139 mmol/L (ref 134–144)
Total Protein: 5.7 g/dL — ABNORMAL LOW (ref 6.0–8.5)
eGFR: 69 mL/min/{1.73_m2} (ref >59)

## 2021-02-08 LAB — CBC WITH DIFFERENTIAL/PLATELET
Basophils Absolute: 0 10*3/uL (ref 0.0–0.2)
Basos: 1 %
EOS (ABSOLUTE): 0.2 10*3/uL (ref 0.0–0.4)
Eos: 5 %
Hematocrit: 31.5 % — ABNORMAL LOW (ref 37.5–51.0)
Hemoglobin: 10.5 g/dL — ABNORMAL LOW (ref 13.0–17.7)
Immature Grans (Abs): 0 10*3/uL (ref 0.0–0.1)
Immature Granulocytes: 0 %
Lymphocytes Absolute: 1.1 10*3/uL (ref 0.7–3.1)
Lymphs: 26 %
MCH: 29.7 pg (ref 26.6–33.0)
MCHC: 33.3 g/dL (ref 31.5–35.7)
MCV: 89 fL (ref 79–97)
Monocytes Absolute: 0.5 10*3/uL (ref 0.1–0.9)
Monocytes: 10 %
Neutrophils Absolute: 2.6 10*3/uL (ref 1.4–7.0)
Neutrophils: 58 %
Platelets: 170 10*3/uL (ref 150–450)
RBC: 3.54 x10E6/uL — ABNORMAL LOW (ref 4.14–5.80)
RDW: 13.6 % (ref 11.6–15.4)
WBC: 4.4 10*3/uL (ref 3.4–10.8)

## 2021-02-08 LAB — LIPASE: Lipase: 28 U/L (ref 13–78)

## 2021-02-13 ENCOUNTER — Encounter: Payer: Self-pay | Admitting: Family Medicine

## 2021-02-13 ENCOUNTER — Other Ambulatory Visit: Payer: Self-pay

## 2021-02-13 ENCOUNTER — Ambulatory Visit (INDEPENDENT_AMBULATORY_CARE_PROVIDER_SITE_OTHER): Payer: PPO | Admitting: Family Medicine

## 2021-02-13 VITALS — BP 96/52 | HR 74 | Temp 99.3°F | Resp 16 | Wt 248.0 lb

## 2021-02-13 DIAGNOSIS — R1084 Generalized abdominal pain: Secondary | ICD-10-CM

## 2021-02-13 DIAGNOSIS — I4891 Unspecified atrial fibrillation: Secondary | ICD-10-CM

## 2021-02-13 DIAGNOSIS — I10 Essential (primary) hypertension: Secondary | ICD-10-CM

## 2021-02-13 DIAGNOSIS — Z8711 Personal history of peptic ulcer disease: Secondary | ICD-10-CM

## 2021-02-13 DIAGNOSIS — E785 Hyperlipidemia, unspecified: Secondary | ICD-10-CM | POA: Diagnosis not present

## 2021-02-13 DIAGNOSIS — I25118 Atherosclerotic heart disease of native coronary artery with other forms of angina pectoris: Secondary | ICD-10-CM

## 2021-02-13 NOTE — Progress Notes (Signed)
Established patient visit   Patient: Jared Rice   DOB: 03/27/1933   87 y.o. Male  MRN: [id number] Visit Date: 02/13/2021  Today's healthcare provider: Wilhemena Durie, MD   Chief Complaint  Patient presents with   Follow-up   Abdominal Pain   Subjective    HPI  Patient is abdominal discomfort is much better.  He is still having 3 diarrhea stools per day on average with no normal stool.  He is feeling much better now.  He is having some mild lightheadedness when he first stands up. No other symptoms.  He is feeling fairly well. Follow up for abdominal pain   The patient was last seen for this 1 weeks ago. Changes made at last visit include labs were stable. Patient has still not been able to get abdominal US.  He reports good compliance with treatment. He feels that condition is Unchanged. Patient reports that he is still having diarrhea. Denies fevers or chills. Denies nausea or vomiting. He has discontinued all OTC Vitamins and has started taking probiotics.   -----------------------------------------------------------------------------------------     Medications: Outpatient Medications Prior to Visit  Medication Sig   acetaminophen (TYLENOL) 500 MG tablet Take 1,000 mg by mouth every 6 (six) hours as needed for moderate pain.   apixaban (ELIQUIS) 5 MG TABS tablet Take 1 tablet (5 mg total) by mouth 2 (two) times daily.   Blood Pressure KIT Use to monitor blood pressure as directed   diclofenac Sodium (VOLTAREN) 1 % GEL Apply 2 g topically 4 (four) times daily as needed (knee pain).   diphenhydramine-acetaminophen (TYLENOL PM) 25-500 MG TABS Take 2 tablets by mouth at bedtime.   doxazosin (CARDURA) 8 MG tablet Take 1 tablet (8 mg total) by mouth daily.   furosemide (LASIX) 40 MG tablet Take 1 tablet by mouth once daily (dose change)   levothyroxine (SYNTHROID) 75 MCG tablet Take 1 tablet (75 mcg total) by mouth daily before breakfast. Take 1 tablet by  mouth daily in the morning   mupirocin ointment (BACTROBAN) 2 % Apply 1 application topically daily. And cover with band aid   quinapril (ACCUPRIL) 20 MG tablet Take 1 tablet (20 mg total) by mouth daily. (Patient taking differently: Take 20 mg by mouth at bedtime.)   rosuvastatin (CRESTOR) 20 MG tablet Patient takes 37m (Patient taking differently: Take 10 mg by mouth at bedtime.)   tacrolimus (PROTOPIC) 0.1 % ointment Apply topically 2 (two) times daily. Bid aa rash on buttocks until clear then prn flares, avoid face, groin, axilla   topiramate (TOPAMAX) 50 MG tablet Take 1 tablet by mouth twice daily   traMADol (ULTRAM) 50 MG tablet Take 1-2 tablets (50-100 mg total) by mouth every 6 (six) hours as needed for moderate pain.   No facility-administered medications prior to visit.    Review of Systems  Constitutional:  Negative for activity change, appetite change, chills and fever.  Gastrointestinal:  Positive for diarrhea. Negative for abdominal pain, blood in stool, constipation, nausea, rectal pain and vomiting.  Neurological:  Negative for dizziness, light-headedness and headaches.  Psychiatric/Behavioral:  Negative for self-injury, sleep disturbance and suicidal ideas. The patient is not nervous/anxious.        Objective    BP (!) 96/52   Pulse 74   Temp 99.3 F (37.4 C)   Resp 16   Wt 248 lb (112.5 kg)   BMI 32.72 kg/m  BP Readings from Last 3 Encounters:  02/13/21 (!) 96/52  02/06/21 125/79  11/14/20 114/68   Wt Readings from Last 3 Encounters:  02/13/21 248 lb (112.5 kg)  02/06/21 244 lb (110.7 kg)  11/14/20 248 lb 3.2 oz (112.6 kg)      Physical Exam Vitals reviewed.  Constitutional:      Appearance: Normal appearance.  HENT:     Head: Normocephalic and atraumatic.     Right Ear: External ear normal.     Left Ear: External ear normal.  Eyes:     General: No scleral icterus.    Conjunctiva/sclera: Conjunctivae normal.  Cardiovascular:     Rate and Rhythm:  Normal rate and regular rhythm.     Pulses: Normal pulses.     Heart sounds: Normal heart sounds.  Pulmonary:     Effort: Pulmonary effort is normal.     Breath sounds: Normal breath sounds.  Abdominal:     Tenderness: There is no abdominal tenderness.  Musculoskeletal:     Comments: He has 1+ lower extremity edema  Skin:    General: Skin is warm and dry.  Neurological:     General: No focal deficit present.     Mental Status: He is alert and oriented to person, place, and time.  Psychiatric:        Mood and Affect: Mood normal.        Behavior: Behavior normal.        Thought Content: Thought content normal.        Judgment: Judgment normal.      No results found for any visits on 02/13/21.  Assessment & Plan     1. Generalized abdominal pain Markedly improved.  Ultrasound still pending.  May need GI referral if symptoms worsen - US Abdomen Complete; Future  2. Atherosclerosis of native coronary artery of native heart with stable angina pectoris ([place]) All risk factors treated.  3. Primary hypertension Patient hypotensive today with normal labs a week ago.  At this time stop Monopril and follow-up in 1 month  4. Atrial fibrillation, unspecified type ([place]) On Eliquis  5. Hyperlipidemia, unspecified hyperlipidemia type   6. History of peptic ulcer No signs of ulcer presently.  Consider restarting PPI or follow-up CBC in the near future   No follow-ups on file.      I, Wilhemena Durie, MD, have reviewed all documentation for this visit. The documentation on 02/17/21 for the exam, diagnosis, procedures, and orders are all accurate and complete.    Makalynn Berwanger Cranford Mon, MD  [facility] [phone] (phone) [phone] (fax)  [place]

## 2021-02-13 NOTE — Patient Instructions (Signed)
Stop Quinapril.  Add Metamucil daily.

## 2021-02-15 ENCOUNTER — Telehealth: Payer: Self-pay | Admitting: Family Medicine

## 2021-02-15 NOTE — Telephone Encounter (Addendum)
Brandy with [facility] would like to know if Dr Rosanna Randy will put in an order for a stool specimen?  Theadora Rama gave him a sterile cup and advised him how to obtain the specimen.   Call back when order is in and she will contact pt  [phone]

## 2021-02-20 ENCOUNTER — Other Ambulatory Visit: Payer: Self-pay | Admitting: *Deleted

## 2021-02-20 DIAGNOSIS — R197 Diarrhea, unspecified: Secondary | ICD-10-CM

## 2021-02-20 NOTE — Telephone Encounter (Signed)
Orders placed. Returned call to [place]. Patient will bring specimen here for culture.

## 2021-02-20 NOTE — Telephone Encounter (Signed)
Returned call to patient to advise the specimen he dropped off earlier was not in the correct container. Patient will come by lab today to pick up correct supplies.

## 2021-02-26 ENCOUNTER — Telehealth: Payer: Self-pay

## 2021-02-26 ENCOUNTER — Ambulatory Visit: Payer: PPO | Attending: Family Medicine

## 2021-02-26 NOTE — Telephone Encounter (Signed)
Copied from [place] [id number]. Topic: General - Other >> Feb 26, 2021 10:31 AM Erick Blinks wrote: Reason for CRM: Jared Rice from [facility] [place] called to report that the patient never showed for his Ultrasound today. Courtesy call

## 2021-03-05 DIAGNOSIS — W19XXXD Unspecified fall, subsequent encounter: Secondary | ICD-10-CM | POA: Diagnosis not present

## 2021-03-05 DIAGNOSIS — S51812D Laceration without foreign body of left forearm, subsequent encounter: Secondary | ICD-10-CM | POA: Diagnosis not present

## 2021-03-15 ENCOUNTER — Ambulatory Visit (INDEPENDENT_AMBULATORY_CARE_PROVIDER_SITE_OTHER): Payer: PPO | Admitting: Family Medicine

## 2021-03-15 ENCOUNTER — Other Ambulatory Visit: Payer: Self-pay

## 2021-03-15 VITALS — BP 131/60 | HR 94 | Temp 98.4°F | Wt 254.0 lb

## 2021-03-15 DIAGNOSIS — K5790 Diverticulosis of intestine, part unspecified, without perforation or abscess without bleeding: Secondary | ICD-10-CM | POA: Diagnosis not present

## 2021-03-15 DIAGNOSIS — Z23 Encounter for immunization: Secondary | ICD-10-CM

## 2021-03-15 DIAGNOSIS — R1084 Generalized abdominal pain: Secondary | ICD-10-CM | POA: Diagnosis not present

## 2021-03-15 DIAGNOSIS — Z96659 Presence of unspecified artificial knee joint: Secondary | ICD-10-CM | POA: Diagnosis not present

## 2021-03-15 DIAGNOSIS — E6609 Other obesity due to excess calories: Secondary | ICD-10-CM

## 2021-03-15 DIAGNOSIS — Z6833 Body mass index (BMI) 33.0-33.9, adult: Secondary | ICD-10-CM | POA: Diagnosis not present

## 2021-03-15 DIAGNOSIS — G250 Essential tremor: Secondary | ICD-10-CM | POA: Diagnosis not present

## 2021-03-15 NOTE — Progress Notes (Signed)
Established patient visit   Patient: Jared Rice   DOB: 1932/10/22   87 y.o. Male  MRN: [id number] Visit Date: 03/15/2021  Today's healthcare provider: Wilhemena Durie, MD   No chief complaint on file.  Subjective    HPI  Patient is an 87 year old male who presents for follow up of abdominal pain.  He tested positive for Covid 2 months ago and his only symptom was diarrhea.  Since then he has continued to have the diarrhea.  He states that he feels he is doing much better but is still having diarrhea. He denies any abdominal pain. He is now having about 2 bowel movements per day.  He is feeling better and wishes to manage this without any further referral.  He did not get his ultrasound done.  He forgot about it.   Medications: Outpatient Medications Prior to Visit  Medication Sig   acetaminophen (TYLENOL) 500 MG tablet Take 1,000 mg by mouth every 6 (six) hours as needed for moderate pain.   apixaban (ELIQUIS) 5 MG TABS tablet Take 1 tablet (5 mg total) by mouth 2 (two) times daily.   Blood Pressure KIT Use to monitor blood pressure as directed   diclofenac Sodium (VOLTAREN) 1 % GEL Apply 2 g topically 4 (four) times daily as needed (knee pain).   diphenhydramine-acetaminophen (TYLENOL PM) 25-500 MG TABS Take 2 tablets by mouth at bedtime.   doxazosin (CARDURA) 8 MG tablet Take 1 tablet (8 mg total) by mouth daily.   furosemide (LASIX) 40 MG tablet Take 1 tablet by mouth once daily (dose change)   levothyroxine (SYNTHROID) 75 MCG tablet Take 1 tablet (75 mcg total) by mouth daily before breakfast. Take 1 tablet by mouth daily in the morning   mupirocin ointment (BACTROBAN) 2 % Apply 1 application topically daily. And cover with band aid   quinapril (ACCUPRIL) 20 MG tablet Take 1 tablet (20 mg total) by mouth daily. (Patient taking differently: Take 20 mg by mouth at bedtime.)   rosuvastatin (CRESTOR) 20 MG tablet Patient takes 66m (Patient taking differently: Take 10 mg  by mouth at bedtime.)   tacrolimus (PROTOPIC) 0.1 % ointment Apply topically 2 (two) times daily. Bid aa rash on buttocks until clear then prn flares, avoid face, groin, axilla   topiramate (TOPAMAX) 50 MG tablet Take 1 tablet by mouth twice daily   traMADol (ULTRAM) 50 MG tablet Take 1-2 tablets (50-100 mg total) by mouth every 6 (six) hours as needed for moderate pain.   No facility-administered medications prior to visit.    Review of Systems  Constitutional:  Negative for fever.  Gastrointestinal:  Positive for diarrhea. Negative for abdominal distention, abdominal pain, anal bleeding, blood in stool, constipation, nausea, rectal pain and vomiting.       Objective    BP 131/60 (BP Location: Left Arm, Patient Position: Sitting, Cuff Size: Large)   Pulse 94   Temp 98.4 F (36.9 C) (Oral)   Wt 254 lb (115.2 kg)   SpO2 96%   BMI 33.51 kg/m  BP Readings from Last 3 Encounters:  03/15/21 131/60  02/13/21 (!) 96/52  02/06/21 125/79   Wt Readings from Last 3 Encounters:  03/15/21 254 lb (115.2 kg)  02/13/21 248 lb (112.5 kg)  02/06/21 244 lb (110.7 kg)      Physical Exam Vitals reviewed.  Constitutional:      Appearance: Normal appearance.  HENT:     Head: Normocephalic and atraumatic.  Right Ear: External ear normal.     Left Ear: External ear normal.  Eyes:     General: No scleral icterus.    Conjunctiva/sclera: Conjunctivae normal.  Cardiovascular:     Rate and Rhythm: Normal rate and regular rhythm.     Pulses: Normal pulses.     Heart sounds: Normal heart sounds.  Pulmonary:     Effort: Pulmonary effort is normal.     Breath sounds: Normal breath sounds.  Abdominal:     Tenderness: There is no abdominal tenderness.  Musculoskeletal:     Comments: He has 1+ lower extremity edema  Skin:    General: Skin is warm and dry.  Neurological:     General: No focal deficit present.     Mental Status: He is alert and oriented to person, place, and time.   Psychiatric:        Mood and Affect: Mood normal.        Behavior: Behavior normal.        Thought Content: Thought content normal.        Judgment: Judgment normal.      No results found for any visits on 03/15/21.  Assessment & Plan     1. Generalized abdominal pain Improved.  Continue probiotic daily.  If it worsens will get ultrasound and refer to GI.  He declines further work-up present  2. Need for influenza vaccination   3. DD (diverticular disease)   4. Benign essential tremor Clinically stable  5. Status post total knee replacement, unspecified laterality   6. Class 1 obesity due to excess calories with serious comorbidity and body mass index (BMI) of 33.0 to 33.9 in adult With arthritis GERD hypertension hyperlipidemia   No follow-ups on file.      I, Wilhemena Durie, MD, have reviewed all documentation for this visit. The documentation on 03/21/21 for the exam, diagnosis, procedures, and orders are all accurate and complete.    Laramie Gelles Cranford Mon, MD  [facility] [phone] (phone) [phone] (fax)  [place]

## 2021-03-19 ENCOUNTER — Telehealth: Payer: Self-pay

## 2021-03-19 NOTE — Telephone Encounter (Signed)
Copied from [place] [id number]. Topic: General - Other >> Mar 19, 2021 11:35 AM Yvette Rack wrote: Reason for CRM: Pt requested to speak with Judson Roch. Pt requests that she return his call. Cb# [phone]

## 2021-03-21 DIAGNOSIS — Z09 Encounter for follow-up examination after completed treatment for conditions other than malignant neoplasm: Secondary | ICD-10-CM | POA: Diagnosis not present

## 2021-03-21 DIAGNOSIS — Z9181 History of falling: Secondary | ICD-10-CM | POA: Diagnosis not present

## 2021-03-21 DIAGNOSIS — Z87828 Personal history of other (healed) physical injury and trauma: Secondary | ICD-10-CM | POA: Diagnosis not present

## 2021-03-28 ENCOUNTER — Ambulatory Visit (INDEPENDENT_AMBULATORY_CARE_PROVIDER_SITE_OTHER): Payer: PPO

## 2021-03-28 ENCOUNTER — Other Ambulatory Visit: Payer: Self-pay

## 2021-03-28 ENCOUNTER — Telehealth: Payer: Self-pay

## 2021-03-28 DIAGNOSIS — Z23 Encounter for immunization: Secondary | ICD-10-CM

## 2021-03-28 NOTE — Progress Notes (Signed)
Chronic Care Management Pharmacy Assistant   Name: Jared Rice  MRN: [id number] DOB: November 27, 1932  Reason for Encounter: Hypertension Disease State Call   Recent office visits:  03/15/2021 [name], MD (PCP Office Visit) for Follow-up- No medication changes noted, no orders placed, no follow-up noted  02/13/2021 [name], MD (PCP Office Visit) for Follow-up- No medication changes noted, US Abdomen Complete placed   02/06/2021 [name], MD (PCP Office Visit) for Diarrhea- No medication changes note, lab order placed,   01/15/2021 [name], MD (PCP Telemedicine) for Diarrhea- No medication changes noted, No orders placed   Recent consult visits:  None ID  Hospital visits:  None in previous 6 months  Medications: Outpatient Encounter Medications as of 03/28/2021  Medication Sig   acetaminophen (TYLENOL) 500 MG tablet Take 1,000 mg by mouth every 6 (six) hours as needed for moderate pain.   apixaban (ELIQUIS) 5 MG TABS tablet Take 1 tablet (5 mg total) by mouth 2 (two) times daily.   Blood Pressure KIT Use to monitor blood pressure as directed   diclofenac Sodium (VOLTAREN) 1 % GEL Apply 2 g topically 4 (four) times daily as needed (knee pain).   diphenhydramine-acetaminophen (TYLENOL PM) 25-500 MG TABS Take 2 tablets by mouth at bedtime.   doxazosin (CARDURA) 8 MG tablet Take 1 tablet (8 mg total) by mouth daily.   furosemide (LASIX) 40 MG tablet Take 1 tablet by mouth once daily (dose change)   levothyroxine (SYNTHROID) 75 MCG tablet Take 1 tablet (75 mcg total) by mouth daily before breakfast. Take 1 tablet by mouth daily in the morning   mupirocin ointment (BACTROBAN) 2 % Apply 1 application topically daily. And cover with band aid   quinapril (ACCUPRIL) 20 MG tablet Take 1 tablet (20 mg total) by mouth daily. (Patient taking differently: Take 20 mg by mouth at bedtime.)   rosuvastatin (CRESTOR) 20 MG tablet Patient takes 64m (Patient taking  differently: Take 10 mg by mouth at bedtime.)   tacrolimus (PROTOPIC) 0.1 % ointment Apply topically 2 (two) times daily. Bid aa rash on buttocks until clear then prn flares, avoid face, groin, axilla   topiramate (TOPAMAX) 50 MG tablet Take 1 tablet by mouth twice daily   traMADol (ULTRAM) 50 MG tablet Take 1-2 tablets (50-100 mg total) by mouth every 6 (six) hours as needed for moderate pain.   No facility-administered encounter medications on file as of 03/28/2021.   Care Gaps: Diabetic Foot Exam Diabetic Eye Exam COVID-19 Vaccine Booster 4 Hemoglobin A1C  Star Rating Drugs: Quinapril 20 mg last filled on 11/24/2020 for a [facility]-Day supply [facility] 11/24/2020 for a 90-Day supply with [facility] Patient only takes 10 mg of this medication daily  [facility] with [facility] they stated the refill dates are correct, and the patient does have 2 refills left.   Reviewed chart prior to disease state call. Spoke with patient regarding BP  Recent Office Vitals: BP Readings from Last 3 Encounters:  03/15/21 131/60  02/13/21 (!) 96/52  02/06/21 125/79   Pulse Readings from Last 3 Encounters:  03/15/21 94  02/13/21 74  02/06/21 74    Wt Readings from Last 3 Encounters:  03/15/21 254 lb (115.2 kg)  02/13/21 248 lb (112.5 kg)  02/06/21 244 lb (110.7 kg)     Kidney Function Lab Results  Component Value Date/Time   CREATININE 1.05 02/07/2021 10:37 AM   CREATININE 1.33 (H)  11/03/2020 01:35 PM   CREATININE 0.87 10/13/2013 10:55 AM   GFRNONAA >60 08/24/2020 12:18 PM   GFRNONAA >60 10/13/2013 10:55 AM   GFRAA 74 07/05/2020 10:31 AM   GFRAA >60 10/13/2013 10:55 AM    BMP Latest Ref Rng & Units 02/07/2021 11/03/2020 08/24/2020  Glucose 65 - 99 mg/dL 89 100(H) 90  BUN 8 - 27 mg/dL 20 18 16   Creatinine 0.76 - 1.27 mg/dL 1.05 1.33(H) 1.12  BUN/Creat Ratio 10 - 24 19 14  -  Sodium 134 - 144 mmol/L 139 142 141  Potassium 3.5 - 5.2  mmol/L 4.3 4.6 4.0  Chloride 96 - 106 mmol/L 103 106 107  CO2 20 - 29 mmol/L 24 25 25   Calcium 8.6 - 10.2 mg/dL 8.6 8.7 8.6(L)    Current antihypertensive regimen:  Quinapril 20 mg 1 tablet daily-discontinued  How often are you checking your Blood Pressure?  Patient reports that he does not have a blood pressure monitor in the home so he does not check it at home. Patient stated he does check his blood pressure when he goes to his doctor's appointments, and there is a home health care agency that comes to the home during the month and checks it for him.  Current home BP readings: None ID 131/60 on 10/20 at [place]  What recent interventions/DTPs have been made by any provider to improve Blood Pressure control since last CPP Visit: The patient was hypotensive during his PCP visit on 02/13/2021, and was told to hold the Quinapril at that time. The patient reports that he is still not taking this medication. Since being off of the medication patient denies having any more hypotension episodes. I sent a message to CPP inquiring if the patient should resume as patient was unsure if he should still be holding the medication. Per CPP the Quinapril will be discontinued at this time, and patient should continue to monitor his blood pressure when he can. Patient is to call providers office if his numbers start going above 140/90. Patient informed and verbalized understanding to all.    Any recent hospitalizations or ED visits since last visit with CPP? No  What diet changes have been made to improve Blood Pressure Control?  Patient stated that he eats a normal diet, he never add salt to any of his food and he does watch the amount of sodium he intakes.   What exercise is being done to improve your Blood Pressure Control?  Patient has no specific exercise regiment. He just moves around as he needs to.  Adherence Review: Is the patient currently on ACE/ARB medication? Yes Does the patient have >5  day gap between last estimated fill dates? No   Lynann Bologna, CPA/CMA Clinical Pharmacist Assistant Phone: [phone]

## 2021-04-09 DIAGNOSIS — M7522 Bicipital tendinitis, left shoulder: Secondary | ICD-10-CM | POA: Diagnosis not present

## 2021-04-09 DIAGNOSIS — M7542 Impingement syndrome of left shoulder: Secondary | ICD-10-CM | POA: Diagnosis not present

## 2021-04-09 DIAGNOSIS — M7582 Other shoulder lesions, left shoulder: Secondary | ICD-10-CM | POA: Diagnosis not present

## 2021-04-11 ENCOUNTER — Telehealth: Payer: Self-pay | Admitting: Cardiology

## 2021-04-11 ENCOUNTER — Other Ambulatory Visit: Payer: Self-pay

## 2021-04-11 MED ORDER — APIXABAN 5 MG PO TABS: 5.0000 mg | ORAL_TABLET | Freq: Two times a day (BID) | ORAL | 3 refills | Status: DC

## 2021-04-11 NOTE — Telephone Encounter (Signed)
Patient calling the office for samples of medication:   1.  What medication and dosage are you requesting samples for? apixaban (ELIQUIS) 5 MG TABS tablet  2.  Are you currently out of this medication? Pt has enough to last him through November, he needs samples to last him through December

## 2021-04-11 NOTE — Telephone Encounter (Signed)
Prescription refill request for Eliquis received. Indication:Afib Last office visit:6/22 Scr:1.3 Age: 87 Weight:115.2 kg  Prescription refilled

## 2021-04-12 ENCOUNTER — Telehealth: Payer: Self-pay | Admitting: Family Medicine

## 2021-04-12 MED ORDER — FUROSEMIDE 40 MG PO TABS: ORAL_TABLET | ORAL | 1 refills | Status: DC

## 2021-04-12 NOTE — Addendum Note (Signed)
Addended by: Randal Buba on: 04/12/2021 02:52 PM   Modules accepted: Orders

## 2021-04-12 NOTE — Telephone Encounter (Signed)
[facility] faxed refill request for the following medications:   furosemide (LASIX) 40 MG tablet   Please advise.

## 2021-04-28 ENCOUNTER — Other Ambulatory Visit: Payer: Self-pay | Admitting: Family Medicine

## 2021-04-28 NOTE — Telephone Encounter (Signed)
Requested medication (s) are due for refill today: yes  Requested medication (s) are on the active medication list: yes  Last refill:  01/26/21 #180  Future visit scheduled: yes  Notes to clinic:  med not  delegated to NT to RF    Requested Prescriptions  Pending Prescriptions Disp Refills   topiramate (TOPAMAX) 50 MG tablet [Pharmacy Med Name: Topiramate 50 MG Oral Tablet] 180 tablet 0    Sig: Take 1 tablet by mouth twice daily     Not Delegated - Neurology: Anticonvulsants - topiramate & zonisamide Failed - 04/28/2021  2:58 AM      Failed - This refill cannot be delegated      Passed - Cr in normal range and within 360 days    Creatinine  Date Value Ref Range Status  10/13/2013 0.87 0.60 - 1.30 mg/dL Final   Creatinine, Ser  Date Value Ref Range Status  02/07/2021 1.05 0.76 - 1.27 mg/dL Final          Passed - CO2 in normal range and within 360 days    CO2  Date Value Ref Range Status  02/07/2021 24 20 - 29 mmol/L Final   Co2  Date Value Ref Range Status  10/13/2013 30 21 - 32 mmol/L Final          Passed - Valid encounter within last 12 months    Recent Outpatient Visits           1 month ago Generalized abdominal pain   [facility] Jerrol Banana., MD   2 months ago Generalized abdominal pain   [facility] Jerrol Banana., MD   2 months ago Generalized abdominal pain   [facility] Jerrol Banana., MD   3 months ago Diarrhea, unspecified type   [facility] Jerrol Banana., MD   10 months ago Essential hypertension   [facility] Jerrol Banana., MD       Future Appointments             In 2 months Ralene Bathe, MD [place]   In 3 months Jerrol Banana., MD [facility], [place]

## 2021-04-30 NOTE — Telephone Encounter (Signed)
LOV:03/15/2021  NOV: 08/14/2021  Thanks,   -Mickel Baas

## 2021-05-25 DIAGNOSIS — M2041 Other hammer toe(s) (acquired), right foot: Secondary | ICD-10-CM | POA: Diagnosis not present

## 2021-05-25 DIAGNOSIS — M2042 Other hammer toe(s) (acquired), left foot: Secondary | ICD-10-CM | POA: Diagnosis not present

## 2021-05-25 DIAGNOSIS — E6609 Other obesity due to excess calories: Secondary | ICD-10-CM | POA: Diagnosis not present

## 2021-05-25 DIAGNOSIS — M216X2 Other acquired deformities of left foot: Secondary | ICD-10-CM | POA: Diagnosis not present

## 2021-05-25 DIAGNOSIS — B351 Tinea unguium: Secondary | ICD-10-CM | POA: Diagnosis not present

## 2021-05-25 DIAGNOSIS — E1142 Type 2 diabetes mellitus with diabetic polyneuropathy: Secondary | ICD-10-CM | POA: Diagnosis not present

## 2021-05-25 DIAGNOSIS — L84 Corns and callosities: Secondary | ICD-10-CM | POA: Diagnosis not present

## 2021-05-25 DIAGNOSIS — I739 Peripheral vascular disease, unspecified: Secondary | ICD-10-CM | POA: Diagnosis not present

## 2021-05-25 DIAGNOSIS — L97511 Non-pressure chronic ulcer of other part of right foot limited to breakdown of skin: Secondary | ICD-10-CM | POA: Diagnosis not present

## 2021-05-25 DIAGNOSIS — M216X1 Other acquired deformities of right foot: Secondary | ICD-10-CM | POA: Diagnosis not present

## 2021-05-25 DIAGNOSIS — Z6832 Body mass index (BMI) 32.0-32.9, adult: Secondary | ICD-10-CM | POA: Diagnosis not present

## 2021-06-05 ENCOUNTER — Ambulatory Visit (INDEPENDENT_AMBULATORY_CARE_PROVIDER_SITE_OTHER): Payer: PPO

## 2021-06-05 DIAGNOSIS — Z0000 Encounter for general adult medical examination without abnormal findings: Secondary | ICD-10-CM | POA: Diagnosis not present

## 2021-06-05 NOTE — Progress Notes (Signed)
Virtual Visit via Telephone Note  I connected with  Jared Rice on 06/05/21 at  3:00 PM EST by telephone and verified that I am speaking with the correct person using two identifiers.  Location: Patient: home Provider: [facility] Persons participating in the virtual visit: [place]   I discussed the limitations, risks, security and privacy concerns of performing an evaluation and management service by telephone and the availability of in person appointments. The patient expressed understanding and agreed to proceed.  Interactive audio and video telecommunications were attempted between this nurse and patient, however failed, due to patient having technical difficulties OR patient did not have access to video capability.  We continued and completed visit with audio only.  Some vital signs may be absent or patient reported.   Dionisio David, LPN  Subjective:   Jared Rice is a 88 y.o. male who presents for Medicare Annual/Subsequent preventive examination.  Review of Systems           Objective:    There were no vitals filed for this visit. There is no height or weight on file to calculate BMI.  Advanced Directives 09/06/2020 09/06/2020 08/24/2020 05/31/2020 05/24/2019 02/24/2019 05/18/2018  Does Patient Have a Medical Advance Directive? - No Yes Yes Yes Yes Yes  Type of Advance Directive - - - [place];Living will [place];Living will [place];Living will [place];Living will  Does patient want to make changes to medical advance directive? - - - - - No - Patient declined -  Copy of [place] in Chart? - - - Yes - validated most recent copy scanned in chart (See row information) Yes - validated most recent copy scanned in chart (See row information) No - copy requested Yes - validated most recent copy scanned in chart (See row information)  Would patient like  information on creating a medical advance directive? No - Patient declined - - - - No - Patient declined -    Current Medications (verified) Outpatient Encounter Medications as of 06/05/2021  Medication Sig   acetaminophen (TYLENOL) 500 MG tablet Take 1,000 mg by mouth every 6 (six) hours as needed for moderate pain.   apixaban (ELIQUIS) 5 MG TABS tablet Take 1 tablet (5 mg total) by mouth 2 (two) times daily.   Blood Pressure KIT Use to monitor blood pressure as directed   diclofenac Sodium (VOLTAREN) 1 % GEL Apply 2 g topically 4 (four) times daily as needed (knee pain).   diphenhydramine-acetaminophen (TYLENOL PM) 25-500 MG TABS Take 2 tablets by mouth at bedtime.   doxazosin (CARDURA) 8 MG tablet Take 1 tablet (8 mg total) by mouth daily.   furosemide (LASIX) 40 MG tablet Take 1 tablet by mouth once daily (dose change)   levothyroxine (SYNTHROID) 75 MCG tablet Take 1 tablet (75 mcg total) by mouth daily before breakfast. Take 1 tablet by mouth daily in the morning   mupirocin ointment (BACTROBAN) 2 % Apply 1 application topically daily. And cover with band aid   quinapril (ACCUPRIL) 20 MG tablet Take 1 tablet (20 mg total) by mouth daily. (Patient taking differently: Take 20 mg by mouth at bedtime.)   rosuvastatin (CRESTOR) 20 MG tablet Patient takes 8m (Patient taking differently: Take 10 mg by mouth at bedtime.)   tacrolimus (PROTOPIC) 0.1 % ointment Apply topically 2 (two) times daily. Bid aa rash on buttocks until clear then prn flares, avoid face, groin, axilla  topiramate (TOPAMAX) 50 MG tablet Take 1 tablet by mouth twice daily   traMADol (ULTRAM) 50 MG tablet Take 1-2 tablets (50-100 mg total) by mouth every 6 (six) hours as needed for moderate pain.   [DISCONTINUED] rosuvastatin (CRESTOR) 10 MG tablet    No facility-administered encounter medications on file as of 06/05/2021.    Allergies (verified) Dexilant [dexlansoprazole] and Primidone   History: Past Medical History:   Diagnosis Date   Actinic keratosis    Anginal pain ([place])    Aortic atherosclerosis (HCC)    Arthritis    Atrial fibrillation ([place])    Basal cell carcinoma 02/06/2010   Left shoulder. Superficial.   Basal cell carcinoma 10/11/2013   Right medial forearm. Superficial   Basal cell carcinoma 04/10/2015   Right inf. lat. thigh. Superficial   Basal cell carcinoma 12/09/2016   Right cheek. Superficial.   Chronic airway obstruction, not elsewhere classified    Colon polyps    Complication of anesthesia    SPINAL DID NOT WORK FOR LAST KNEE REPLACEMENT AND HAD TO HAVE GA   Coronary atherosclerosis of native coronary artery    nonobstructive   Diabetes mellitus without complication (HCC)    Essential hypertension, benign    GERD (gastroesophageal reflux disease)    H/O   Glucose intolerance (impaired glucose tolerance)    Heart murmur    History of kidney stones    Hyperlipidemia    Hypothyroidism    Iron deficiency anemia    Knee pain, right    LBBB (left bundle branch block)    Occlusion and stenosis of carotid artery without mention of cerebral infarction    wears compression stockings   Other dyspnea and respiratory abnormality    w/ pseuodowheeze resolves with purse lip manuever   Precordial pain    Shoulder pain, right    Squamous cell carcinoma of skin 10/06/2007   Right forearm. SCCis   Squamous cell carcinoma of skin 10/11/2013   Left mid lat. pretibial. KA-like pattern.   Tremor    Unspecified sleep apnea    HAS NOT USED CPAP IN 20 YEARS   Venous insufficiency    Wears dentures    full upper   Past Surgical History:  Procedure Laterality Date   CARDIAC CATHETERIZATION     X2   COLONOSCOPY  03/17/12   Dr Byrnett-diverticulosis   ESOPHAGOGASTRODUODENOSCOPY (EGD) WITH PROPOFOL N/A 11/25/2014   Procedure: ESOPHAGOGASTRODUODENOSCOPY (EGD) WITH PROPOFOL;  Surgeon: Lucilla Lame, MD;  Location: [place];  Service: Endoscopy;  Laterality: N/A;  with biopsy    ESOPHAGOGASTRODUODENOSCOPY (EGD) WITH PROPOFOL N/A 01/05/2015   Procedure: ESOPHAGOGASTRODUODENOSCOPY (EGD) WITH PROPOFOL;  Surgeon: Lucilla Lame, MD;  Location: [place];  Service: Endoscopy;  Laterality: N/A;   EYE SURGERY Bilateral    cataract    KNEE ARTHROPLASTY Right 09/06/2020   Procedure: COMPUTER ASSISTED TOTAL KNEE ARTHROPLASTY;  Surgeon: Dereck Leep, MD;  Location: [facility];  Service: Orthopedics;  Laterality: Right;   TOTAL KNEE ARTHROPLASTY Left 2012   Family History  Problem Relation Age of Onset   Heart failure Mother 59       congestive   Heart attack Father 51   Alcohol abuse Father    Alzheimer's disease Brother 39   Alzheimer's disease Sister 36   Colon cancer Neg Hx    Liver disease Neg Hx    Social History   Socioeconomic History   Marital status: Married    Spouse name: Not on file  Number of children: 2   Years of education: Not on file   Highest education level: Bachelor's degree (e.g., BA, AB, BS)  Occupational History   Occupation: retired    Comment: [employer]  Tobacco Use   Smoking status: Former    Packs/day: 2.00    Years: 31.00    Pack years: 62.00    Types: Cigarettes    Quit date: 12/24/1980    Years since quitting: 40.4   Smokeless tobacco: Never   Tobacco comments:    quit in 1982  Vaping Use   Vaping Use: Never used  Substance and Sexual Activity   Alcohol use: No    Alcohol/week: 0.0 standard drinks   Drug use: No   Sexual activity: Not on file  Other Topics Concern   Not on file  Social History Narrative   Lives with wife, retired Engineer, drilling, 2 children, 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   Social Determinants of Health   Financial Resource Strain: Medium Risk   Difficulty of Paying Living Expenses: Somewhat hard  Food Insecurity: Not on file  Transportation Needs: No Transportation Needs   Lack of Transportation (Medical): No   Lack of Transportation (Non-Medical): No  Physical Activity: Not  on file  Stress: Not on file  Social Connections: Not on file    Tobacco Counseling Counseling given: Not Answered Tobacco comments: quit in 1982   Clinical Intake:  Pre-visit preparation completed: Yes  Pain : No/denies pain     Nutritional Risks: None Diabetes: No  How often do you need to have someone help you when you read instructions, pamphlets, or other written materials from your doctor or pharmacy?: 1 - Never  Diabetic?no  Interpreter Needed?: No  Information entered by :: Kirke Shaggy, LPN   Activities of Daily Living In your present state of health, do you have any difficulty performing the following activities: 09/06/2020 08/24/2020  Hearing? N N  Vision? N N  Difficulty concentrating or making decisions? N N  Walking or climbing stairs? Y Y  Dressing or bathing? N N  Doing errands, shopping? N N  Some recent data might be hidden    Patient Care Team: Jerrol Banana., MD as PCP - General (Unknown Physician Specialty) [place], Peter M, MD as PCP - Cardiology (Cardiology) Dingeldein, Remo Lipps, MD as Consulting Physician (Ophthalmology) Ralene Bathe, MD (Dermatology) Marry Guan Laurice Record, MD (Orthopedic Surgery) Caroline More, DPM as Consulting Physician (Podiatry) Germaine Pomfret, [facility] as Pharmacist (Pharmacist)  Indicate any recent Medical Services you may have received from other than [facility] providers in the past year (date may be approximate).     Assessment:   This is a routine wellness examination for Jared Rice.  Hearing/Vision screen No results found.  Dietary issues and exercise activities discussed:     Goals Addressed   None    Depression Screen PHQ 2/9 Scores 05/31/2020 03/20/2020 05/24/2019 05/24/2019 05/18/2018 09/23/2017 05/15/2017  PHQ - 2 Score 0 0 0 0 0 0 0    Fall Risk Fall Risk  05/31/2020 03/20/2020 05/24/2019 04/19/2019 05/18/2018  Falls in the past year? 1 1 0 0 0  Comment - - - Emmi Telephone Survey: data to providers  prior to load -  Number falls in past yr: 0 0 0 - 0  Injury with Fall? 0 1 0 - 0  Follow up Falls prevention discussed - - - -    FALL RISK PREVENTION PERTAINING TO THE HOME:  Any stairs in or around the  home? Yes  If so, are there any without handrails? No  Home free of loose throw rugs in walkways, pet beds, electrical cords, etc? Yes  Adequate lighting in your home to reduce risk of falls? Yes   ASSISTIVE DEVICES UTILIZED TO PREVENT FALLS:  Life alert? No  Use of a cane, walker or w/c? Yes  Grab bars in the bathroom? Yes  Shower chair or bench in shower? Yes  Elevated toilet seat or a handicapped toilet? Yes     Cognitive Function:Normal cognitive status assessed by direct observation by this Nurse Health Advisor. No abnormalities found.       6CIT Screen 05/24/2019 05/18/2018 05/15/2017  What Year? 0 points 0 points 0 points  What month? 0 points - 0 points  What time? 0 points 0 points 0 points  Count back from 20 0 points 0 points 0 points  Months in reverse 0 points 0 points 0 points  Repeat phrase 0 points 0 points 0 points  Total Score 0 - 0    Immunizations Immunization History  Administered Date(s) Administered   Fluad Quad(high Dose 65+) 02/24/2019, 02/14/2020, 03/28/2021   Influenza Split 02/20/2011, 02/27/2012   Influenza, High Dose Seasonal PF 02/22/2014, 02/18/2015, 02/29/2016, 02/26/2017, 04/22/2018   Influenza,inj,Quad PF,6+ Mos 02/13/2013   Influenza-Unspecified 02/24/2013   PFIZER(Purple Top)SARS-COV-2 Vaccination 06/17/2019, 07/08/2019, 04/26/2020   Pneumococcal Conjugate-13 04/14/2014   Pneumococcal Polysaccharide-23 03/25/1997, 04/05/2003   Td 08/29/2003   Tdap 02/20/2011, 02/14/2020   Zoster Recombinat (Shingrix) 02/26/2017, 06/25/2017   Zoster, Live 05/12/2007    TDAP status: Up to date  Flu Vaccine status: Up to date  Pneumococcal vaccine status: Up to date  Covid-19 vaccine status: Completed vaccines  Qualifies for Shingles  Vaccine? Yes   Zostavax completed Yes   Shingrix Completed?: Yes  Screening Tests Health Maintenance  Topic Date Due   FOOT EXAM  Never done   OPHTHALMOLOGY EXAM  04/26/2020   COVID-19 Vaccine (4 - Booster for Pfizer series) 06/21/2020   HEMOGLOBIN A1C  01/02/2021   TETANUS/TDAP  02/13/2030   Pneumonia Vaccine 40+ Years old  Completed   INFLUENZA VACCINE  Completed   Zoster Vaccines- Shingrix  Completed   HPV VACCINES  Aged Out    Health Maintenance  Health Maintenance Due  Topic Date Due   FOOT EXAM  Never done   OPHTHALMOLOGY EXAM  04/26/2020   COVID-19 Vaccine (4 - Booster for [place] series) 06/21/2020   HEMOGLOBIN A1C  01/02/2021    Colorectal cancer screening: No longer required.   Lung Cancer Screening: (Low Dose CT Chest recommended if Age 18-80 years, 30 pack-year currently smoking OR have quit w/in 15years.) does not qualify.    Additional Screening:  Hepatitis C Screening: does not qualify; Completed no  Vision Screening: Recommended annual ophthalmology exams for early detection of glaucoma and other disorders of the eye. Is the patient up to date with their annual eye exam?  Yes  Who is the provider or what is the name of the office in which the patient attends annual eye exams? [facility] If pt is not established with a provider, would they like to be referred to a provider to establish care? No .   Dental Screening: Recommended annual dental exams for proper oral hygiene  Community Resource Referral / Chronic Care Management: CRR required this visit?  No   CCM required this visit?  No      Plan:     I have personally reviewed and noted  the following in the patients chart:   Medical and social history Use of alcohol, tobacco or illicit drugs  Current medications and supplements including opioid prescriptions. Patient is not currently taking opioid prescriptions. Functional ability and status Nutritional status Physical  activity Advanced directives List of other physicians Hospitalizations, surgeries, and ER visits in previous 12 months Vitals Screenings to include cognitive, depression, and falls Referrals and appointments  In addition, I have reviewed and discussed with patient certain preventive protocols, quality metrics, and best practice recommendations. A written personalized care plan for preventive services as well as general preventive health recommendations were provided to patient.     Dionisio David, LPN   7/97/2820   Nurse Notes: none

## 2021-06-05 NOTE — Patient Instructions (Signed)
Jared Rice , Thank you for taking time to come for your Medicare Wellness Visit. I appreciate your ongoing commitment to your health goals. Please review the following plan we discussed and let me know if I can assist you in the future.   Screening recommendations/referrals: Colonoscopy: 03/17/12, no longer required Recommended yearly ophthalmology/optometry visit for glaucoma screening and checkup Recommended yearly dental visit for hygiene and checkup  Vaccinations: Influenza vaccine: 03/28/21 Pneumococcal vaccine: 04/14/14 Tdap vaccine: 02/14/20 Shingles vaccine: Zostavax 05/12/07   Shingrix 02/26/17, 06/25/17   Covid-19: 06/17/19, 07/08/19, 04/26/20  Advanced directives: copies in chart   Conditions/risks identified: none  Next appointment: Follow up in one year for your annual wellness visit. 06/06/22 @ 1:40 pm  Preventive Care 65 Years and Older, Male Preventive care refers to lifestyle choices and visits with your health care provider that can promote health and wellness. What does preventive care include? A yearly physical exam. This is also called an annual well check. Dental exams once or twice a year. Routine eye exams. Ask your health care provider how often you should have your eyes checked. Personal lifestyle choices, including: Daily care of your teeth and gums. Regular physical activity. Eating a healthy diet. Avoiding tobacco and drug use. Limiting alcohol use. Practicing safe sex. Taking low doses of aspirin every day. Taking vitamin and mineral supplements as recommended by your health care provider. What happens during an annual well check? The services and screenings done by your health care provider during your annual well check will depend on your age, overall health, lifestyle risk factors, and family history of disease. Counseling  Your health care provider may ask you questions about your: Alcohol use. Tobacco use. Drug use. Emotional well-being. Home  and relationship well-being. Sexual activity. Eating habits. History of falls. Memory and ability to understand (cognition). Work and work Statistician. Screening  You may have the following tests or measurements: Height, weight, and BMI. Blood pressure. Lipid and cholesterol levels. These may be checked every 5 years, or more frequently if you are over 21 years old. Skin check. Lung cancer screening. You may have this screening every year starting at age 42 if you have a 30-pack-year history of smoking and currently smoke or have quit within the past 15 years. Fecal occult blood test (FOBT) of the stool. You may have this test every year starting at age 88. Flexible sigmoidoscopy or colonoscopy. You may have a sigmoidoscopy every 5 years or a colonoscopy every 10 years starting at age 69. Prostate cancer screening. Recommendations will vary depending on your family history and other risks. Hepatitis C blood test. Hepatitis B blood test. Sexually transmitted disease (STD) testing. Diabetes screening. This is done by checking your blood sugar (glucose) after you have not eaten for a while (fasting). You may have this done every 1-3 years. Abdominal aortic aneurysm (AAA) screening. You may need this if you are a current or former smoker. Osteoporosis. You may be screened starting at age 67 if you are at high risk. Talk with your health care provider about your test results, treatment options, and if necessary, the need for more tests. Vaccines  Your health care provider may recommend certain vaccines, such as: Influenza vaccine. This is recommended every year. Tetanus, diphtheria, and acellular pertussis (Tdap, Td) vaccine. You may need a Td booster every 10 years. Zoster vaccine. You may need this after age 29. Pneumococcal 13-valent conjugate (PCV13) vaccine. One dose is recommended after age 69. Pneumococcal polysaccharide (PPSV23) vaccine. One dose  is recommended after age 63. Talk to  your health care provider about which screenings and vaccines you need and how often you need them. This information is not intended to replace advice given to you by your health care provider. Make sure you discuss any questions you have with your health care provider. Document Released: 06/09/2015 Document Revised: 01/31/2016 Document Reviewed: 03/14/2015 Elsevier Interactive Patient Education  2017 [employer] Prevention in the Home Falls can cause injuries. They can happen to people of all ages. There are many things you can do to make your home safe and to help prevent falls. What can I do on the outside of my home? Regularly fix the edges of walkways and driveways and fix any cracks. Remove anything that might make you trip as you walk through a door, such as a raised step or threshold. Trim any bushes or trees on the path to your home. Use bright outdoor lighting. Clear any walking paths of anything that might make someone trip, such as rocks or tools. Regularly check to see if handrails are loose or broken. Make sure that both sides of any steps have handrails. Any raised decks and porches should have guardrails on the edges. Have any leaves, snow, or ice cleared regularly. Use sand or salt on walking paths during winter. Clean up any spills in your garage right away. This includes oil or grease spills. What can I do in the bathroom? Use night lights. Install grab bars by the toilet and in the tub and shower. Do not use towel bars as grab bars. Use non-skid mats or decals in the tub or shower. If you need to sit down in the shower, use a plastic, non-slip stool. Keep the floor dry. Clean up any water that spills on the floor as soon as it happens. Remove soap buildup in the tub or shower regularly. Attach bath mats securely with double-sided non-slip rug tape. Do not have throw rugs and other things on the floor that can make you trip. What can I do in the bedroom? Use  night lights. Make sure that you have a light by your bed that is easy to reach. Do not use any sheets or blankets that are too big for your bed. They should not hang down onto the floor. Have a firm chair that has side arms. You can use this for support while you get dressed. Do not have throw rugs and other things on the floor that can make you trip. What can I do in the kitchen? Clean up any spills right away. Avoid walking on wet floors. Keep items that you use a lot in easy-to-reach places. If you need to reach something above you, use a strong step stool that has a grab bar. Keep electrical cords out of the way. Do not use floor polish or wax that makes floors slippery. If you must use wax, use non-skid floor wax. Do not have throw rugs and other things on the floor that can make you trip. What can I do with my stairs? Do not leave any items on the stairs. Make sure that there are handrails on both sides of the stairs and use them. Fix handrails that are broken or loose. Make sure that handrails are as long as the stairways. Check any carpeting to make sure that it is firmly attached to the stairs. Fix any carpet that is loose or worn. Avoid having throw rugs at the top or bottom of the stairs.  If you do have throw rugs, attach them to the floor with carpet tape. Make sure that you have a light switch at the top of the stairs and the bottom of the stairs. If you do not have them, ask someone to add them for you. What else can I do to help prevent falls? Wear shoes that: Do not have high heels. Have rubber bottoms. Are comfortable and fit you well. Are closed at the toe. Do not wear sandals. If you use a stepladder: Make sure that it is fully opened. Do not climb a closed stepladder. Make sure that both sides of the stepladder are locked into place. Ask someone to hold it for you, if possible. Clearly mark and make sure that you can see: Any grab bars or handrails. First and last  steps. Where the edge of each step is. Use tools that help you move around (mobility aids) if they are needed. These include: Canes. Walkers. Scooters. Crutches. Turn on the lights when you go into a dark area. Replace any light bulbs as soon as they burn out. Set up your furniture so you have a clear path. Avoid moving your furniture around. If any of your floors are uneven, fix them. If there are any pets around you, be aware of where they are. Review your medicines with your doctor. Some medicines can make you feel dizzy. This can increase your chance of falling. Ask your doctor what other things that you can do to help prevent falls. This information is not intended to replace advice given to you by your health care provider. Make sure you discuss any questions you have with your health care provider. Document Released: 03/09/2009 Document Revised: 10/19/2015 Document Reviewed: 06/17/2014 Elsevier Interactive Patient Education  2017 [employer].

## 2021-06-08 ENCOUNTER — Telehealth: Payer: Self-pay

## 2021-06-14 DIAGNOSIS — I10 Essential (primary) hypertension: Secondary | ICD-10-CM | POA: Diagnosis not present

## 2021-06-14 DIAGNOSIS — I8390 Asymptomatic varicose veins of unspecified lower extremity: Secondary | ICD-10-CM | POA: Diagnosis not present

## 2021-06-14 DIAGNOSIS — I700 Atherosclerosis of aorta: Secondary | ICD-10-CM | POA: Diagnosis not present

## 2021-06-14 DIAGNOSIS — I712 Thoracic aortic aneurysm, without rupture, unspecified: Secondary | ICD-10-CM | POA: Diagnosis not present

## 2021-06-14 DIAGNOSIS — I4819 Other persistent atrial fibrillation: Secondary | ICD-10-CM | POA: Diagnosis not present

## 2021-06-14 DIAGNOSIS — I878 Other specified disorders of veins: Secondary | ICD-10-CM | POA: Diagnosis not present

## 2021-06-14 DIAGNOSIS — E1151 Type 2 diabetes mellitus with diabetic peripheral angiopathy without gangrene: Secondary | ICD-10-CM | POA: Diagnosis not present

## 2021-06-14 DIAGNOSIS — Z7901 Long term (current) use of anticoagulants: Secondary | ICD-10-CM | POA: Diagnosis not present

## 2021-06-14 DIAGNOSIS — J449 Chronic obstructive pulmonary disease, unspecified: Secondary | ICD-10-CM | POA: Diagnosis not present

## 2021-06-14 DIAGNOSIS — D692 Other nonthrombocytopenic purpura: Secondary | ICD-10-CM | POA: Diagnosis not present

## 2021-06-14 DIAGNOSIS — G250 Essential tremor: Secondary | ICD-10-CM | POA: Diagnosis not present

## 2021-06-14 DIAGNOSIS — I2510 Atherosclerotic heart disease of native coronary artery without angina pectoris: Secondary | ICD-10-CM | POA: Diagnosis not present

## 2021-06-18 ENCOUNTER — Telehealth: Payer: Self-pay

## 2021-06-18 NOTE — Progress Notes (Signed)
° ° °  Chronic Care Management Pharmacy Assistant   Name: Jared Rice  MRN: [id number] DOB: [birth year]  Patient called to be reminded of his appointment with Junius Argyle, CPP on 06/19/2021 @ 1545  Appointment not confirmed. Called patient phone just rang, but no voicemail ever picked up. I was unable to leave a message to remind patient about his appointment.  Star Rating Drug: Quinapril 20 mg last filled on 11/24/2020 for a 90-Day supply with [facility] Rosuvastatin 20 mg last filled on 05/07/2021 for a 90-Day supply with [facility]  Any gaps in medications fill history? yes  Care Gaps: Diabetic Foot Exam Diabetic Eye Exam COVID-19 Booster 4 Hemoglobin A1C lab older than 3 months  Lynann Bologna, CPA/CMA Clinical Pharmacist Assistant Phone: [phone]       Lynann Bologna, [place] Clinical Pharmacist Assistant Phone: [phone]

## 2021-06-19 ENCOUNTER — Telehealth: Payer: PPO

## 2021-06-19 ENCOUNTER — Ambulatory Visit (INDEPENDENT_AMBULATORY_CARE_PROVIDER_SITE_OTHER): Payer: PPO

## 2021-06-19 DIAGNOSIS — I10 Essential (primary) hypertension: Secondary | ICD-10-CM

## 2021-06-19 DIAGNOSIS — I4891 Unspecified atrial fibrillation: Secondary | ICD-10-CM

## 2021-06-19 NOTE — Progress Notes (Signed)
Chronic Care Management Pharmacy Note  06/25/2021 Name:  Jared Rice MRN:  [id number] DOB:  1932/11/13  Summary: Patient presents for CCM follow-up. Denies hypotensive/hypertensive symptoms, but does endorse significant polyuria and nocturia since furosemide was increased.   Recommendations/Changes made from today's visit: -DECREASE Furosemide back to 20 mg daily to see if polyuria improves.   Plan: CPP follow-up in 3 months   Subjective: Jared Rice is an 88 y.o. year old male who is a primary patient of Rosanna Randy, Retia Passe., MD.  The CCM team was consulted for assistance with disease management and care coordination needs.    Engaged with patient by telephone for follow up visit in response to provider referral for pharmacy case management and/or care coordination services.   Consent to Services:  The patient was given information about Chronic Care Management services, agreed to services, and gave verbal consent prior to initiation of services.  Please see initial visit note for detailed documentation.   Patient Care Team: Jerrol Banana., MD as PCP - General (Unknown Physician Specialty) Martinique, Peter M, MD as PCP - Cardiology (Cardiology) Dingeldein, Remo Lipps, MD as Consulting Physician (Ophthalmology) Ralene Bathe, MD (Dermatology) Marry Guan Laurice Record, MD (Orthopedic Surgery) Caroline More, DPM as Consulting Physician (Podiatry) Germaine Pomfret, [facility] as Pharmacist (Pharmacist)  Recent office visits: 06/22/20: Patient presented to Dr. Rosanna Randy for follow-up.   Recent consult visits: 11/14/20: Patient presented to Dr. Martinique (Cardiology) for follow-up. Celecoxib, ondansetron, oxycodone stopped.   Hospital visits: 09/06/20: Patient hospitalized for total knee replacement.    Objective:  Lab Results  Component Value Date   CREATININE 1.05 02/07/2021   BUN 20 02/07/2021   GFRNONAA >60 08/24/2020   GFRAA 74 07/05/2020   NA 139 02/07/2021   K 4.3  02/07/2021   CALCIUM 8.6 02/07/2021   CO2 24 02/07/2021   GLUCOSE 89 02/07/2021    Lab Results  Component Value Date/Time   HGBA1C 5.9 (H) 07/05/2020 10:31 AM   HGBA1C 6.2 (A) 10/20/2019 01:43 PM   HGBA1C 6.3 (H) 06/25/2019 09:08 AM    Last diabetic Eye exam:  Lab Results  Component Value Date/Time   HMDIABEYEEXA No Retinopathy 04/27/2019 12:00 AM    Last diabetic Foot exam: No results found for: HMDIABFOOTEX   Lab Results  Component Value Date   CHOL 100 07/05/2020   HDL 39 (L) 07/05/2020   LDLCALC 51 07/05/2020   TRIG 36 07/05/2020   CHOLHDL 2.6 07/05/2020    Hepatic Function Latest Ref Rng & Units 02/07/2021 08/24/2020 07/05/2020  Total Protein 6.0 - 8.5 g/dL 5.7(L) 6.4(L) 5.8(L)  Albumin 3.6 - 4.6 g/dL 3.7 3.9 4.0  AST 0 - 40 IU/L 12 10(L) 8  ALT 0 - 44 IU/L _0 Alk Phosphatase 44 - 121 IU/L 71 51 67  Total Bilirubin 0.0 - 1.2 mg/dL 0.5 0.7 0.4    Lab Results  Component Value Date/Time   TSH 3.840 07/05/2020 10:31 AM   TSH 4.130 06/25/2019 09:08 AM    CBC Latest Ref Rng & Units 02/07/2021 08/24/2020 07/05/2020  WBC 3.4 - 10.8 x10E3/uL 4.4 7.2 5.4  Hemoglobin 13.0 - 17.7 g/dL 10.5(L) 11.6(L) 11.7(L)  Hematocrit 37.5 - 51.0 % 31.5(L) 37.3(L) 36.2(L)  Platelets 150 - 450 x10E3/uL 170 173 164    No results found for: VD25OH  Clinical ASCVD: No  The ASCVD Risk score (Arnett DK, et al., 2019) failed to calculate for the following reasons:   The 2019 ASCVD  risk score is only valid for ages 69 to 98    Depression screen PHQ 2/9 06/05/2021 05/31/2020 03/20/2020  Decreased Interest 0 0 0  Down, Depressed, Hopeless 0 0 0  PHQ - 2 Score 0 0 0  Some recent data might be hidden    Social History   Tobacco Use  Smoking Status Former   Packs/day: 2.00   Years: 31.00   Pack years: 62.00   Types: Cigarettes   Quit date: 12/24/1980   Years since quitting: 40.5  Smokeless Tobacco Never  Tobacco Comments   quit in 1982   BP Readings from Last 3 Encounters:  03/15/21  131/60  02/13/21 (!) 96/52  02/06/21 125/79   Pulse Readings from Last 3 Encounters:  03/15/21 94  02/13/21 74  02/06/21 74   Wt Readings from Last 3 Encounters:  03/15/21 254 lb (115.2 kg)  02/13/21 248 lb (112.5 kg)  02/06/21 244 lb (110.7 kg)   BMI Readings from Last 3 Encounters:  03/15/21 33.51 kg/m  02/13/21 32.72 kg/m  02/06/21 32.19 kg/m    Assessment/Interventions: Review of patient past medical history, allergies, medications, health status, including review of consultants reports, laboratory and other test data, was performed as part of comprehensive evaluation and provision of chronic care management services.   SDOH:  (Social Determinants of Health) assessments and interventions performed: Yes   SDOH Screenings   Alcohol Screen: Low Risk    Last Alcohol Screening Score (AUDIT): 0  Depression (PHQ2-9): Low Risk    PHQ-2 Score: 0  Financial Resource Strain: Low Risk    Difficulty of Paying Living Expenses: Not hard at all  Food Insecurity: No Food Insecurity   Worried About Charity fundraiser in the Last Year: Never true   Ran Out of Food in the Last Year: Never true  Housing: Low Risk    Last Housing Risk Score: 0  Physical Activity: Inactive   Days of Exercise per Week: 0 days   Minutes of Exercise per Session: 0 min  Social Connections: Moderately Integrated   Frequency of Communication with Friends and Family: More than three times a week   Frequency of Social Gatherings with Friends and Family: More than three times a week   Attends Religious Services: Never   Marine scientist or Organizations: Yes   Attends Music therapist: More than 4 times per year   Marital Status: Married  Stress: No Stress Concern Present   Feeling of Stress : Not at all  Tobacco Use: Medium Risk   Smoking Tobacco Use: Former   Smokeless Tobacco Use: Never   Passive Exposure: Not on Pensions consultant Needs: No Transportation Needs   Lack of  Transportation (Medical): No   Lack of Transportation (Non-Medical): No    CCM Care Plan  Allergies  Allergen Reactions   Dexilant [Dexlansoprazole] Rash   Primidone Itching and Rash    Medications Reviewed Today     Reviewed by Germaine Pomfret, [facility] (Pharmacist) on 06/19/21 at [place] List Status: <None>   Medication Order Taking? Sig Documenting Provider Last Dose Status Informant  apixaban (ELIQUIS) 5 MG TABS tablet [id number]  Take 1 tablet (5 mg total) by mouth 2 (two) times daily. Martinique, Peter M, MD  Active   diphenhydramine-acetaminophen (TYLENOL PM) 25-500 MG TABS [id number] Yes Take 2 tablets by mouth at bedtime. [provider] Taking Active Self  doxazosin (CARDURA) 8 MG tablet [id number]  Take 1 tablet (8 mg  total) by mouth daily. Jerrol Banana., MD  Active   furosemide (LASIX) 40 MG tablet [id number]  Take 1 tablet by mouth once daily (dose change) Jerrol Banana., MD  Active   Hydrocortisone-Iodoquinol [facility]) [id number] Yes Apply 1 application topically daily as needed. [provider] Taking Active   levothyroxine (SYNTHROID) 75 MCG tablet [id number]  Take 1 tablet (75 mcg total) by mouth daily before breakfast. Take 1 tablet by mouth daily in the morning Jerrol Banana., MD  Active   rosuvastatin (CRESTOR) 20 MG tablet [id number]  Patient takes 47m  Patient taking differently: Take 10 mg by mouth at bedtime.   GJerrol Banana, MD  Active   topiramate (TOPAMAX) 50 MG tablet [id number] Take 1 tablet by mouth twice daily Fisher, DKirstie Peri MD  Active   traMADol (ULTRAM) 50 MG tablet [id number] Take 1-2 tablets (50-100 mg total) by mouth every 6 (six) hours as needed for moderate pain.  Patient not taking: Reported on 06/05/2021   MLattie Corns PA-C  Active             Patient Active Problem List   Diagnosis Date Noted   Status post total right knee replacement 10/19/2020   Total knee replacement  status 09/06/2020   Atrial fibrillation ([place] 05/12/2020   Urinary frequency 11/22/2019   Leukocytes in urine 11/22/2019   Skin tear of forearm without complication, initial encounter- left 11/22/2019   Wound cellulitis- right lower leg anterior  10/29/2019   Leg wound, right, sequela 10/29/2019   Chronic fatigue 11/29/2015   Benign prostatic hyperplasia 02/22/2015   Gastrointestinal ulcer    History of peptic ulcer    Hematochezia    Acute gastric ulcer    Melena 11/12/2014   Malignant neoplasm of skin 09/21/2014   Cataract 09/21/2014   Colon polyp 09/21/2014   CAFL (chronic airflow limitation) ([place] 09/21/2014   Diabetes ([place] 09/21/2014   DD (diverticular disease) 09/21/2014   Benign essential tremor 09/21/2014   Personal history of tobacco use, presenting hazards to health 09/21/2014   Acid reflux 09/21/2014   Hemorrhoids 09/21/2014   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   HLD (hyperlipidemia) 09/21/2014   BP (high blood pressure) 09/21/2014   Hypothyroidism 09/21/2014   Lymphoma ([place] 09/21/2014   Malaise and fatigue 09/21/2014   Mononeuritis 09/21/2014   Muscle ache 09/21/2014   Neuropathy 09/21/2014   Numbness and tingling 09/21/2014   Arthritis, degenerative 09/21/2014   Awareness of heartbeats 09/21/2014   Borderline diabetes 09/21/2014   Rotator cuff syndrome 09/21/2014   Has a tremor 09/21/2014   Stasis, venous 09/21/2014   Avitaminosis D 09/21/2014   Arthritis of knee, degenerative 11/16/2013   Bradycardia, sinus 12/31/2011   Left bundle branch block 12/31/2011   First degree AV block 12/31/2011   Edema of both legs 12/31/2011   Bradycardia 12/31/2011   Localized edema 12/31/2011   COPD UNSPECIFIED 11/09/2009   HYPERTENSION, BENIGN 10/03/2009   SLEEP APNEA 10/03/2009   DYSPNEA 10/03/2009   Other forms of dyspnea 10/03/2009   CAD, NATIVE VESSEL 05/04/2009   Carotid disease, bilateral ([place] 05/04/2009   Disorder of artery or arteriole ([place] 05/04/2009     Immunization History  Administered Date(s) Administered   Fluad Quad(high Dose 65+) 02/24/2019, 02/14/2020, 03/28/2021   Influenza Split 02/20/2011, 02/27/2012   Influenza, High Dose Seasonal PF 02/22/2014, 02/18/2015, 02/29/2016, 02/26/2017, 04/22/2018   Influenza,inj,Quad PF,6+ Mos 02/13/2013   Influenza-Unspecified 02/24/2013   PFIZER(Purple Top)SARS-COV-2 Vaccination  06/17/2019, 07/08/2019, 04/26/2020   Pneumococcal Conjugate-13 04/14/2014   Pneumococcal Polysaccharide-23 03/25/1997, 04/05/2003   Td 08/29/2003   Tdap 02/20/2011, 02/14/2020   Zoster Recombinat (Shingrix) 02/26/2017, 06/25/2017   Zoster, Live 05/12/2007    Conditions to be addressed/monitored:  Hypertension, Hyperlipidemia, Diabetes, Atrial Fibrillation, Coronary Artery Disease, COPD, Hypothyroidism, and BPH  Care Plan : General Pharmacy (Adult)  Updates made by Germaine Pomfret, RPH since 06/25/2021 12:00 AM     Problem: Hypertension, Hyperlipidemia, Diabetes, Atrial Fibrillation, Coronary Artery Disease, COPD, Hypothyroidism, and BPH   Priority: High     Long-Range Goal: Patient-Specific Goal   Start Date: 12/11/2020  Expected End Date: 12/11/2021  This Visit's Progress: On track  Recent Progress: On track  Priority: High  Note:   Current Barriers:  No barriers noted   Pharmacist Clinical Goal(s):  Patient will maintain control of blood pressure as evidenced by BP less than 140/90  through collaboration with PharmD and provider.   Interventions: 1:1 collaboration with Jerrol Banana., MD regarding development and update of comprehensive plan of care as evidenced by provider attestation and co-signature Inter-disciplinary care team collaboration (see longitudinal plan of care) Comprehensive medication review performed; medication list updated in electronic medical record  Hypertension (BP goal <140/90) -Uncontrolled -Current treatment: Doxazosin 8 mg nightly: Appropriate, Effective,  Safe, Accessible Furosemide 40 mg daily: Appropriate, Effective, Query safe Quinapril 20 mg daily: Appropriate, Effective, Safe, Accessible  -Medications previously tried: NA  -Current home readings: 158/80 -Current dietary habits: low sodium diet -Current exercise habits: Working with PT to do daily exercises and build knee strength -Denies hypotensive/hypertensive symptoms, but does endorse significant polyuria and nocturia since furosemide was increased.  -Educated on Proper BP monitoring technique; -Counseled to monitor BP at home weekly, document, and provide log at future appointments -DECREASE Furosemide back to 20 mg daily to see if polyuria improves.   Atrial Fibrillation (Goal: prevent stroke and major bleeding) -Controlled -CHADSVASC: 5 -Current treatment: Rate control: None Anticoagulation: Eliquis 5 mg twice daily: Appropriate, Effective, Safe, Accessible  -Medications previously tried: NA -Counseled on avoidance of NSAIDs due to increased bleeding risk with anticoagulants; -Recommended to continue current medication  Hyperlipidemia: (LDL goal < 70) -Controlled -Current treatment: Rosuvastatin 20 mg 1/2 tablet nightly  -Medications previously tried: NA  -Educated on Importance of limiting foods high in cholesterol; -Recommended to continue current medication  Hypothyroidism (Goal: Maintain stable thyroid) -Controlled -Current treatment  Levothyroxine 75 mcg daily  -Medications previously tried: NA  -Recommended to continue current medication  Chronic Kidney Disease Stage 3a  -All medications assessed for renal dosing and appropriateness in chronic kidney disease. -Will need to reduce Eliquis if creatinine > 1.5.  -Recommended to continue current medication  Patient Goals/Self-Care Activities Patient will:  - check blood pressure weekly, document, and provide at future appointments  Follow Up Plan: Telephone follow up appointment with care management team  member scheduled for:  09/17/2021 at 3:45 PM       Medication Assistance: None required.  Patient affirms current coverage meets needs.  Compliance/Adherence/Medication fill history: Care Gaps: Foot exam, ophthalmology exam  Covid Booster   Star-Rating Drugs: Quinapril 20 mg last filled on 11/24/2020 for a 90-Day supply at [facility] Rosuvastatin 20 mg last filled on 11/24/2020 for a 90-Day supply at [facility]  Patient's preferred pharmacy is:  [facility] [address], [place] - [place] [address] Phone: [phone] Fax: [phone]  [facility] ([place], [place] [address] Phone: [phone] Fax: [phone]   Uses pill box? Yes Pt endorses 100% compliance  We discussed: Current pharmacy is preferred with insurance plan and patient is satisfied with pharmacy services Patient decided to: Continue current medication management strategy  Care Plan and Follow Up Patient Decision:  Patient agrees to Care Plan and Follow-up.  Plan: Telephone follow up appointment with care management team member scheduled for:  09/17/2021 at 3:45 PM

## 2021-06-25 NOTE — Patient Instructions (Signed)
Visit Information It was great speaking with you today!  Please let me know if you have any questions about our visit.   Goals Addressed             This Visit's Progress    Track and Manage My Blood Pressure-Hypertension   On track    Timeframe:  Long-Range Goal Priority:  High Start Date:  12/11/20                            Expected End Date: 12/11/21                       Follow Up within 90 days    - check blood pressure weekly    Why is this important?   You won't feel high blood pressure, but it can still hurt your blood vessels.  High blood pressure can cause heart or kidney problems. It can also cause a stroke.  Making lifestyle changes like losing a little weight or eating less salt will help.  Checking your blood pressure at home and at different times of the day can help to control blood pressure.  If the doctor prescribes medicine remember to take it the way the doctor ordered.  Call the office if you cannot afford the medicine or if there are questions about it.     Notes:         Patient Care Plan: General Pharmacy (Adult)     Problem Identified: Hypertension, Hyperlipidemia, Diabetes, Atrial Fibrillation, Coronary Artery Disease, COPD, Hypothyroidism, and BPH   Priority: High     Long-Range Goal: Patient-Specific Goal   Start Date: 12/11/2020  Expected End Date: 12/11/2021  This Visit's Progress: On track  Recent Progress: On track  Priority: High  Note:   Current Barriers:  No barriers noted   Pharmacist Clinical Goal(s):  Patient will maintain control of blood pressure as evidenced by BP less than 140/90  through collaboration with PharmD and provider.   Interventions: 1:1 collaboration with Jerrol Banana., MD regarding development and update of comprehensive plan of care as evidenced by provider attestation and co-signature Inter-disciplinary care team collaboration (see longitudinal plan of care) Comprehensive medication review  performed; medication list updated in electronic medical record  Hypertension (BP goal <140/90) -Uncontrolled -Current treatment: Doxazosin 8 mg nightly: Appropriate, Effective, Safe, Accessible Furosemide 40 mg daily: Appropriate, Effective, Query safe Quinapril 20 mg daily: Appropriate, Effective, Safe, Accessible  -Medications previously tried: NA  -Current home readings: 158/80 -Current dietary habits: low sodium diet -Current exercise habits: Working with PT to do daily exercises and build knee strength -Denies hypotensive/hypertensive symptoms, but does endorse significant polyuria and nocturia since furosemide was increased.  -Educated on Proper BP monitoring technique; -Counseled to monitor BP at home weekly, document, and provide log at future appointments -DECREASE Furosemide back to 20 mg daily to see if polyuria improves.   Atrial Fibrillation (Goal: prevent stroke and major bleeding) -Controlled -CHADSVASC: 5 -Current treatment: Rate control: None Anticoagulation: Eliquis 5 mg twice daily: Appropriate, Effective, Safe, Accessible  -Medications previously tried: NA -Counseled on avoidance of NSAIDs due to increased bleeding risk with anticoagulants; -Recommended to continue current medication  Hyperlipidemia: (LDL goal < 70) -Controlled -Current treatment: Rosuvastatin 20 mg 1/2 tablet nightly  -Medications previously tried: NA  -Educated on Importance of limiting foods high in cholesterol; -Recommended to continue current medication  Hypothyroidism (Goal: Maintain stable thyroid) -Controlled -Current  treatment  Levothyroxine 75 mcg daily  -Medications previously tried: NA  -Recommended to continue current medication  Chronic Kidney Disease Stage 3a  -All medications assessed for renal dosing and appropriateness in chronic kidney disease. -Will need to reduce Eliquis if creatinine > 1.5.  -Recommended to continue current medication  Patient Goals/Self-Care  Activities Patient will:  - check blood pressure weekly, document, and provide at future appointments  Follow Up Plan: Telephone follow up appointment with care management team member scheduled for:  09/17/2021 at 3:45 PM      Patient agreed to services and verbal consent obtained.   Patient verbalizes understanding of instructions and care plan provided today and agrees to view in [place]. Active MyChart status confirmed with patient.    Junius Argyle, PharmD, Para March, CPP  Clinical Pharmacist Practitioner  [facility] [phone]

## 2021-06-26 DIAGNOSIS — I129 Hypertensive chronic kidney disease with stage 1 through stage 4 chronic kidney disease, or unspecified chronic kidney disease: Secondary | ICD-10-CM | POA: Diagnosis not present

## 2021-06-26 DIAGNOSIS — Z87891 Personal history of nicotine dependence: Secondary | ICD-10-CM | POA: Diagnosis not present

## 2021-06-26 DIAGNOSIS — N1831 Chronic kidney disease, stage 3a: Secondary | ICD-10-CM | POA: Diagnosis not present

## 2021-06-26 DIAGNOSIS — I4891 Unspecified atrial fibrillation: Secondary | ICD-10-CM

## 2021-07-05 ENCOUNTER — Other Ambulatory Visit: Payer: Self-pay

## 2021-07-05 ENCOUNTER — Ambulatory Visit: Payer: PPO | Admitting: Dermatology

## 2021-07-05 DIAGNOSIS — L814 Other melanin hyperpigmentation: Secondary | ICD-10-CM | POA: Diagnosis not present

## 2021-07-05 DIAGNOSIS — Z1283 Encounter for screening for malignant neoplasm of skin: Secondary | ICD-10-CM | POA: Diagnosis not present

## 2021-07-05 DIAGNOSIS — D229 Melanocytic nevi, unspecified: Secondary | ICD-10-CM

## 2021-07-05 DIAGNOSIS — D1800 Hemangioma unspecified site: Secondary | ICD-10-CM

## 2021-07-05 DIAGNOSIS — L821 Other seborrheic keratosis: Secondary | ICD-10-CM | POA: Diagnosis not present

## 2021-07-05 DIAGNOSIS — Z85828 Personal history of other malignant neoplasm of skin: Secondary | ICD-10-CM

## 2021-07-05 DIAGNOSIS — I872 Venous insufficiency (chronic) (peripheral): Secondary | ICD-10-CM

## 2021-07-05 DIAGNOSIS — L89326 Pressure-induced deep tissue damage of left buttock: Secondary | ICD-10-CM | POA: Diagnosis not present

## 2021-07-05 DIAGNOSIS — Z872 Personal history of diseases of the skin and subcutaneous tissue: Secondary | ICD-10-CM

## 2021-07-05 DIAGNOSIS — L578 Other skin changes due to chronic exposure to nonionizing radiation: Secondary | ICD-10-CM

## 2021-07-05 DIAGNOSIS — L304 Erythema intertrigo: Secondary | ICD-10-CM

## 2021-07-05 NOTE — Progress Notes (Signed)
Follow-Up Visit   Subjective  Jared Rice is a 88 y.o. male who presents for the following: Total body skin exam (Hx of BCCs, SCCs, AKs) and Intertrigo (Groin, infra abdominal, SM Intertrigo cream).  He also complains of a sore on his buttocks he would like checked. The patient presents for Total-Body Skin Exam (TBSE) for skin cancer screening and mole check.  The patient has spots, moles and lesions to be evaluated, some may be new or changing and the patient has concerns that these could be cancer.  The following portions of the chart were reviewed this encounter and updated as appropriate:   Tobacco   Allergies   Meds   Problems   Med Hx   Surg Hx   Fam Hx      Review of Systems:  No other skin or systemic complaints except as noted in HPI or Assessment and Plan.  Objective  Well appearing patient in no apparent distress; mood and affect are within normal limits.  A full examination was performed including scalp, head, eyes, ears, nose, lips, neck, chest, axillae, abdomen, back, buttocks, bilateral upper extremities, bilateral lower extremities, hands, feet, fingers, toes, fingernails, and toenails. All findings within normal limits unless otherwise noted below.  groin, infra abdominal Clear today  L buttock Red patch with scale  bil lower legs Severe stasis changes   Assessment & Plan   Lentigines - Scattered tan macules - Due to sun exposure - Benign-appearing, observe - Recommend daily broad spectrum sunscreen SPF 30+ to sun-exposed areas, reapply every 2 hours as needed. - Call for any changes  Seborrheic Keratoses - Stuck-on, waxy, tan-brown papules and/or plaques  - Benign-appearing - Discussed benign etiology and prognosis. - Observe - Call for any changes  Melanocytic Nevi - Tan-brown and/or pink-flesh-colored symmetric macules and papules - Benign appearing on exam today - Observation - Call clinic for new or changing moles - Recommend daily use of  broad spectrum spf 30+ sunscreen to sun-exposed areas.   Hemangiomas - Red papules - Discussed benign nature - Observe - Call for any changes  Actinic Damage - Chronic condition, secondary to cumulative UV/sun exposure - diffuse scaly erythematous macules with underlying dyspigmentation - Recommend daily broad spectrum sunscreen SPF 30+ to sun-exposed areas, reapply every 2 hours as needed.  - Staying in the shade or wearing long sleeves, sun glasses (UVA+UVB protection) and wide brim hats (4-inch brim around the entire circumference of the hat) are also recommended for sun protection.  - Call for new or changing lesions.  Skin cancer screening performed today.  Intertrigo Chronic and persistent condition with duration or expected duration over one year. Condition is symptomatic / bothersome to patient. Not to goal. groin, infra abdominal Intertrigo is a chronic recurrent rash that occurs in skin fold areas that may be associated with friction; heat; moisture; yeast; fungus; and bacteria.  It is exacerbated by increased movement / activity; sweating; and higher atmospheric temperature.  Cont [facility] Intertrigo cr qd prn  Pressure injury of deep tissue of left buttock Chronic and persistent condition with duration or expected duration over one year. Condition is symptomatic/ bothersome to patient. Not currently at goal. L buttock Impending Decubitus Ulcer  Start [facility] Intertrigo cream qd  Stasis dermatitis of both legs bil lower legs Severe Stasis in the legs causes chronic leg swelling, which may result in itchy or painful rashes, skin discoloration, skin texture changes, and sometimes ulceration.  Recommend daily graduated compression hose/stockings- easiest  to put on first thing in morning, remove at bedtime.  Elevate legs as much as possible. Avoid salt/sodium rich foods.  Recommend compression socks qd  Skin cancer screening  History of Basal Cell  Carcinoma of the Skin - No evidence of recurrence today - Recommend regular full body skin exams - Recommend daily broad spectrum sunscreen SPF 30+ to sun-exposed areas, reapply every 2 hours as needed.  - Call if any new or changing lesions are noted between office visits   History of Squamous Cell Carcinoma of the Skin - No evidence of recurrence today - No lymphadenopathy - Recommend regular full body skin exams - Recommend daily broad spectrum sunscreen SPF 30+ to sun-exposed areas, reapply every 2 hours as needed.  - Call if any new or changing lesions are noted between office visits   History of PreCancerous Actinic Keratosis  - site(s) of PreCancerous Actinic Keratosis clear today. - these may recur and new lesions may form requiring treatment to prevent transformation into skin cancer - observe for new or changing spots and contact [facility] for appointment if occur - photoprotection with sun protective clothing; sunglasses and broad spectrum sunscreen with SPF of at least 30 + and frequent self skin exams recommended - yearly exams by a dermatologist recommended for persons with history of PreCancerous Actinic Keratoses   Return in about 6 months (around 01/02/2022) for UBSE, Hx of BCC, Hx of SCC, Hx of AKs.  I, Othelia Pulling, RMA, am acting as scribe for Sarina Ser, MD . Documentation: I have reviewed the above documentation for accuracy and completeness, and I agree with the above.  Sarina Ser, MD

## 2021-07-05 NOTE — Patient Instructions (Addendum)
If You Need Anything After Your Visit ° °If you have any questions or concerns for your doctor, please call our main line at [phone] and press option 4 to reach your doctor's medical assistant. If no one answers, please leave a voicemail as directed and we will return your call as soon as possible. Messages left after 4 pm will be answered the following business day.  ° °You may also send us a message via MyChart. We typically respond to MyChart messages within 1-2 business days. ° °For prescription refills, please ask your pharmacy to contact our office. Our fax number is [phone]. ° °If you have an urgent issue when the clinic is closed that cannot wait until the next business day, you can page your doctor at the number below.   ° °Please note that while we do our best to be available for urgent issues outside of office hours, we are not available 24/7.  ° °If you have an urgent issue and are unable to reach us, you may choose to seek medical care at your doctor's office, retail clinic, urgent care center, or emergency room. ° °If you have a medical emergency, please immediately call 911 or go to the emergency department. ° °Pager Numbers ° °- Dr. Kowalski: [phone] ° °- Dr. Moye: [phone] ° °- Dr. Stewart: [phone] ° °In the event of inclement weather, please call our main line at [phone] for an update on the status of any delays or closures. ° °Dermatology Medication Tips: °Please keep the boxes that topical medications come in in order to help keep track of the instructions about where and how to use these. Pharmacies typically print the medication instructions only on the boxes and not directly on the medication tubes.  ° °If your medication is too expensive, please contact our office at [phone] option 4 or send us a message through MyChart.  ° °We are unable to tell what your co-pay for medications will be in advance as this is different depending on your insurance coverage.  However, we may be able to find a substitute medication at lower cost or fill out paperwork to get insurance to cover a needed medication.  ° °If a prior authorization is required to get your medication covered by your insurance company, please allow us 1-2 business days to complete this process. ° °Drug prices often vary depending on where the prescription is filled and some pharmacies may offer cheaper prices. ° °The website www.goodrx.com contains coupons for medications through different pharmacies. The prices here do not account for what the cost may be with help from insurance (it may be cheaper with your insurance), but the website can give you the price if you did not use any insurance.  °- You can print the associated coupon and take it with your prescription to the pharmacy.  °- You may also stop by our office during regular business hours and pick up a GoodRx coupon card.  °- If you need your prescription sent electronically to a different pharmacy, notify our office through [facility] MyChart or by phone at [phone] option 4. ° ° ° ° °Si Usted Necesita Algo Después de Su Visita ° °También puede enviarnos un mensaje a través de MyChart. Por lo general respondemos a los mensajes de MyChart en el transcurso de 1 a 2 días hábiles. ° °Para renovar recetas, por favor pida a su farmacia que se ponga en contacto con nuestra oficina. Nuestro número de fax es el [phone]. ° °Si tiene   un asunto urgente cuando la clnica est cerrada y que no puede esperar hasta el siguiente da hbil, puede llamar/localizar a su doctor(a) al nmero que aparece a continuacin.   Por favor, tenga en cuenta que aunque hacemos todo lo posible para estar disponibles para asuntos urgentes fuera del horario de [place], no estamos disponibles las 24 horas del da, los 7 das de la [place].   Si tiene un problema urgente y no puede comunicarse con nosotros, puede optar por buscar atencin mdica  en el consultorio de su  doctor(a), en una clnica privada, en un centro de atencin urgente o en una sala de emergencias.  Si tiene Engineering geologist, por favor llame inmediatamente al 911 o vaya a la sala de emergencias.  Nmeros de bper  - Dr. Nehemiah Massed: [phone]  - Dra. Moye: [phone]  - Dra. Nicole Kindred: [phone]  En caso de inclemencias del [place], por favor llame a Johnsie Kindred principal al [phone] para una actualizacin sobre el [place] de cualquier retraso o cierre.  Consejos para la medicacin en dermatologa: Por favor, guarde las cajas en las que vienen los medicamentos de uso tpico para ayudarle a seguir las instrucciones sobre dnde y cmo usarlos. Las farmacias generalmente imprimen las instrucciones del medicamento slo en las cajas y no directamente en los tubos del [place].   Si su medicamento es muy caro, por favor, pngase en contacto con Zigmund Daniel llamando al [phone] y presione la opcin 4 o envenos un mensaje a travs de Pharmacist, community.   No podemos decirle cul ser su copago por los medicamentos por adelantado ya que esto es diferente dependiendo de la cobertura de su seguro. Sin embargo, es posible que podamos encontrar un medicamento sustituto a Electrical engineer un formulario para que el seguro cubra el medicamento que se considera necesario.   Si se requiere una autorizacin previa para que su compaa de seguros [place] su medicamento, por favor permtanos de 1 a 2 das hbiles para completar este proceso.  Los precios de los medicamentos varan con frecuencia dependiendo del Environmental consultant de dnde se surte la receta y alguna farmacias pueden ofrecer precios ms baratos.  El sitio web www.goodrx.com tiene cupones para medicamentos de Airline pilot. Los precios aqu no tienen en cuenta lo que podra costar con la ayuda del seguro (puede ser ms barato con su seguro), pero el sitio web puede darle el precio si no utiliz Research scientist (physical sciences).  - Puede imprimir el cupn  correspondiente y llevarlo con su receta a la farmacia.  - Tambin puede pasar por nuestra oficina durante el horario de atencin regular y Charity fundraiser una tarjeta de cupones de GoodRx.  - Si necesita que su receta se enve electrnicamente a una farmacia diferente, informe a nuestra oficina a travs de MyChart de [place] o por telfono llamando al [phone] y presione la opcin 4.   For spot on buttocks, start [facility] Intertrigo cream once daily   Instructions for [facility] Medications  One or more of your medications was sent to the [facility] mail order compounding pharmacy. You will receive an email from them and can purchase the medicine through that link. It will then be mailed to your home at the address you confirmed. If for any reason you do not receive an email from them, please check your spam folder. If you still do not find the email, please let us know. [facility] phone number is [phone].

## 2021-07-08 ENCOUNTER — Encounter: Payer: Self-pay | Admitting: Dermatology

## 2021-07-11 DIAGNOSIS — M7582 Other shoulder lesions, left shoulder: Secondary | ICD-10-CM | POA: Diagnosis not present

## 2021-07-11 DIAGNOSIS — M7522 Bicipital tendinitis, left shoulder: Secondary | ICD-10-CM | POA: Diagnosis not present

## 2021-07-11 DIAGNOSIS — M7542 Impingement syndrome of left shoulder: Secondary | ICD-10-CM | POA: Diagnosis not present

## 2021-07-19 ENCOUNTER — Encounter: Payer: Self-pay | Admitting: Physician Assistant

## 2021-07-19 ENCOUNTER — Other Ambulatory Visit: Payer: Self-pay

## 2021-07-19 ENCOUNTER — Ambulatory Visit
Admission: RE | Admit: 2021-07-19 | Discharge: 2021-07-19 | Disposition: A | Discharge status: Home / Self Care | Payer: PPO | Source: Ambulatory Visit | Attending: Physician Assistant | Admitting: Physician Assistant

## 2021-07-19 ENCOUNTER — Ambulatory Visit (INDEPENDENT_AMBULATORY_CARE_PROVIDER_SITE_OTHER): Payer: PPO | Admitting: Physician Assistant

## 2021-07-19 ENCOUNTER — Ambulatory Visit
Admission: RE | Admit: 2021-07-19 | Discharge: 2021-07-19 | Disposition: A | Discharge status: Home / Self Care | Payer: PPO | Attending: Physician Assistant | Admitting: Physician Assistant

## 2021-07-19 ENCOUNTER — Ambulatory Visit: Payer: Self-pay | Admitting: *Deleted

## 2021-07-19 VITALS — BP 144/100 | HR 81 | Resp 16 | Wt 263.7 lb

## 2021-07-19 DIAGNOSIS — R0602 Shortness of breath: Secondary | ICD-10-CM | POA: Insufficient documentation

## 2021-07-19 DIAGNOSIS — I4891 Unspecified atrial fibrillation: Secondary | ICD-10-CM | POA: Diagnosis not present

## 2021-07-19 DIAGNOSIS — J439 Emphysema, unspecified: Secondary | ICD-10-CM | POA: Diagnosis not present

## 2021-07-19 MED ORDER — QUINAPRIL HCL 5 MG PO TABS: ORAL_TABLET | ORAL | 0 refills | Status: DC

## 2021-07-19 NOTE — Progress Notes (Signed)
Established patient visit   Patient: Jared Rice   DOB: 1932-10-27   88 y.o. Male  MRN: [id number] Visit Date: 07/19/2021  Today's healthcare provider: Mardene Speak, PA-C   Chief Complaint  Patient presents with   Shortness of Breath    Patient comes in office today with concerns of shortness of breath that began this morning at 6:30. Patient states that he was laying in bed and could not get a deep breath, he states as the day has gone on his breathing has improved.    Subjective    HPI    Fatigue  He reports new onset fatigue which he describes as a lack of energy, feeling exhausted, feeling sleepy, and feeling weak. It began a few days ago and occurs all the time. It is described as mild and staying constant. He has not started new medications around the time the fatigue started.   Associated symptoms: No arthralgias No bleeding  No melena No chest discomfort  No heart palpitations No heart racing   Yes dyspnea No feeling depressed  No feeling anxious or under stress No fevers  No loss of appetite No nausea  No vomiting No sleeping problems    Wt Readings from Last 3 Encounters:  07/19/21 263 lb 11.2 oz (119.6 kg)  03/15/21 254 lb (115.2 kg)  02/13/21 248 lb (112.5 kg)    Lab Results  Component Value Date   WBC 4.4 02/07/2021   HGB 10.5 (L) 02/07/2021   HCT 31.5 (L) 02/07/2021   MCV 89 02/07/2021   PLT 170 02/07/2021   Lab Results  Component Value Date   TSH 3.840 07/05/2020   Lab Results  Component Value Date   NA 139 02/07/2021   K 4.3 02/07/2021   CO2 24 02/07/2021   BUN 20 02/07/2021   CREATININE 1.05 02/07/2021   CALCIUM 8.6 02/07/2021   GLUCOSE 89 02/07/2021       Shortness of breath  Onset: sudden, at 6:30 am (see above) Description of breathing discomfort: "could not take a deep breath, as day go on it gone better " right now, can get a deep breath nothing like this morning" Severity: moderate to severe, does not recall if he  felt such sob before Episode duration: 1.5 hours and get better Frequency:1 episode Related to exertion: no, "it just woke him up"  Cough: no Chest tightness: no Wheezing: a little bit Fevers: no Chest pain: no Palpitations:  no Nausea: no Diaphoresis: no Deconditioning: "feels very healthy " Status: better Aggravating factors: no Alleviating factors: no Treatments attempted: "tried to take a deep breath it helped"  Medications: Outpatient Medications Prior to Visit  Medication Sig   apixaban (ELIQUIS) 5 MG TABS tablet Take 1 tablet (5 mg total) by mouth 2 (two) times daily.   diphenhydramine-acetaminophen (TYLENOL PM) 25-500 MG TABS Take 2 tablets by mouth at bedtime.   doxazosin (CARDURA) 8 MG tablet Take 1 tablet (8 mg total) by mouth daily.   furosemide (LASIX) 40 MG tablet Take 1 tablet by mouth once daily (dose change)   Hydrocortisone-Iodoquinol (IODOQUINOL-HC EX) Apply 1 application topically daily as needed.   levothyroxine (SYNTHROID) 75 MCG tablet Take 1 tablet (75 mcg total) by mouth daily before breakfast. Take 1 tablet by mouth daily in the morning   rosuvastatin (CRESTOR) 20 MG tablet Patient takes 10mg  (Patient taking differently: Take 10 mg by mouth at bedtime.)   topiramate (TOPAMAX) 50 MG tablet Take 1 tablet by  mouth twice daily   traMADol (ULTRAM) 50 MG tablet Take 1-2 tablets (50-100 mg total) by mouth every 6 (six) hours as needed for moderate pain.   No facility-administered medications prior to visit.    Review of Systems  Constitutional:  Positive for fatigue. Negative for chills and diaphoresis.  Eyes:  Negative for visual disturbance.  Respiratory:  Positive for shortness of breath. Negative for cough and chest tightness.   Cardiovascular:  Positive for leg swelling. Negative for chest pain and palpitations.  Gastrointestinal:  Positive for blood in stool and diarrhea.  All other systems reviewed and are negative.    Objective    BP (!) 144/100     Pulse 81    Resp 16    Wt 263 lb 11.2 oz (119.6 kg)    SpO2 96%    BMI 34.79 kg/m    Physical Exam Vitals reviewed.  Constitutional:      Appearance: He is well-developed. He is obese.  HENT:     Head: Normocephalic and atraumatic.  Eyes:     Extraocular Movements: Extraocular movements intact.     Pupils: Pupils are equal, round, and reactive to light.  Neck:     Vascular: No JVD.  Cardiovascular:     Rate and Rhythm: Rhythm irregular.  Chest:     Chest wall: No tenderness.  Musculoskeletal:     Cervical back: Normal range of motion.     Right lower leg: Tenderness present. Edema present.     Left lower leg: Tenderness present. Edema present.  Neurological:     General: No focal deficit present.     Mental Status: He is alert.     Cranial Nerves: No cranial nerve deficit.  Psychiatric:        Mood and Affect: Mood is not anxious.       Assessment & Plan     Dyspnea -secondary to CHF, COPD, MI -Ordered labs for evaluation: CMP, CBC, thyroid, troponin T, BNP -Ordered chest XR, EKG, initial O2 saturation was 96%, dropped to 84% after exercise, returned back to 97% post oxygen administration. -Continue Lasix 40mg , add Quinapril -FU with Dr.Gilbert on next Tuesday -FU with Dr.Jordan, a cardiologist     I discussed the assessment and treatment plan with the patient. I consulted Dr. Rosanna Randy, his primary physician. The patient was provided an opportunity to ask questions and all were answered. The patient agreed with the plan and demonstrated an understanding of the instructions.   The patient was advised to call back or seek an in-person evaluation if the symptoms worsen or if the condition fails to improve as anticipated.   The entirety of the information documented in the History of Present Illness, Review of Systems and Physical Exam were personally obtained by me. Portions of this information were initially documented by the CMA and reviewed by me for thoroughness and  accuracy.    Mardene Speak, PA-C  Mardene Speak, [place] [phone] (phone) [phone] (fax)  [place]

## 2021-07-19 NOTE — Telephone Encounter (Signed)
°  Chief Complaint: SOB Symptoms: SOB this am laying in bed at 530, better now" Increased fatigue last few days. Frequency: This AM Pertinent Negatives: Patient denies cp, dizziness, palpitations,sweating. Disposition: [] ED /[] Urgent Care (no appt availability in office) / [x] Appointment(In office/virtual)/ []  [facility]/ [] Home Care/ [] Refused Recommended Disposition /[] [facility]/ []  Follow-up with PCP Additional Notes: Pt has appt today. Advised ED for worsening symptoms.     Reason for Disposition  [1] MILD difficulty breathing (e.g., minimal/no SOB at rest, SOB with walking, pulse <100) AND [2] NEW-onset or WORSE than normal  Answer Assessment - Initial Assessment Questions 1. RESPIRATORY STATUS: "Describe your breathing?" (e.g., wheezing, shortness of breath, unable to speak, severe coughing)      SOB this AM at 0530, laying in bed 2. ONSET: "When did this breathing problem begin?"      This AM 3. PATTERN "Does the difficult breathing come and go, or has it been constant since it started?"      Comes and goes, "Better now" 4. SEVERITY: "How bad is your breathing?" (e.g., mild, moderate, severe)    - MILD: No SOB at rest, mild SOB with walking, speaks normally in sentences, can lie down, no retractions, pulse < 100.    - MODERATE: SOB at rest, SOB with minimal exertion and prefers to sit, cannot lie down flat, speaks in phrases, mild retractions, audible wheezing, pulse 100-120.    - SEVERE: Very SOB at rest, speaks in single words, struggling to breathe, sitting hunched forward, retractions, pulse > 120      Mild now 5. RECURRENT SYMPTOM: "Have you had difficulty breathing before?" If Yes, ask: "When was the last time?" and "What happened that time?"      NO 6. CARDIAC HISTORY: "Do you have any history of heart disease?" (e.g., heart attack, angina, bypass surgery, angioplasty)      Afib 7. LUNG HISTORY: "Do you have any history of lung disease?"  (e.g.,  pulmonary embolus, asthma, emphysema)      8. CAUSE: "What do you think is causing the breathing problem?"      Not sure 9. OTHER SYMPTOMS: "Do you have any other symptoms? (e.g., dizziness, runny nose, cough, chest pain, fever)     LAst 3-4 days, increased fatigue 10. O2 SATURATION MONITOR:  "Do you use an oxygen saturation monitor (pulse oximeter) at home?" If Yes, "What is your reading (oxygen level) today?" "What is your usual oxygen saturation reading?" (e.g., 95%)  Protocols used: Breathing Difficulty-A-AH

## 2021-07-20 LAB — TSH: TSH: 8.24 u[IU]/mL — ABNORMAL HIGH (ref 0.450–4.500)

## 2021-07-20 LAB — COMPREHENSIVE METABOLIC PANEL
ALT: 6 IU/L (ref 0–44)
AST: 10 IU/L (ref 0–40)
Albumin/Globulin Ratio: 2.1 (ref 1.2–2.2)
Albumin: 4.1 g/dL (ref 3.6–4.6)
Alkaline Phosphatase: 74 IU/L (ref 44–121)
BUN/Creatinine Ratio: 21 (ref 10–24)
BUN: 20 mg/dL (ref 8–27)
Bilirubin Total: 0.7 mg/dL (ref 0.0–1.2)
CO2: 25 mmol/L (ref 20–29)
Calcium: 9 mg/dL (ref 8.6–10.2)
Chloride: 107 mmol/L — ABNORMAL HIGH (ref 96–106)
Creatinine, Ser: 0.97 mg/dL (ref 0.76–1.27)
Globulin, Total: 2 g/dL (ref 1.5–4.5)
Glucose: 82 mg/dL (ref 70–99)
Potassium: 4.4 mmol/L (ref 3.5–5.2)
Sodium: 142 mmol/L (ref 134–144)
Total Protein: 6.1 g/dL (ref 6.0–8.5)
eGFR: 76 mL/min/{1.73_m2} (ref >59)

## 2021-07-20 LAB — CBC WITH DIFFERENTIAL/PLATELET
Basophils Absolute: 0 10*3/uL (ref 0.0–0.2)
Basos: 1 %
EOS (ABSOLUTE): 0.1 10*3/uL (ref 0.0–0.4)
Eos: 2 %
Hematocrit: 34.6 % — ABNORMAL LOW (ref 37.5–51.0)
Hemoglobin: 11.3 g/dL — ABNORMAL LOW (ref 13.0–17.7)
Immature Grans (Abs): 0 10*3/uL (ref 0.0–0.1)
Immature Granulocytes: 0 %
Lymphocytes Absolute: 1.3 10*3/uL (ref 0.7–3.1)
Lymphs: 19 %
MCH: 30.8 pg (ref 26.6–33.0)
MCHC: 32.7 g/dL (ref 31.5–35.7)
MCV: 94 fL (ref 79–97)
Monocytes Absolute: 0.7 10*3/uL (ref 0.1–0.9)
Monocytes: 10 %
Neutrophils Absolute: 4.6 10*3/uL (ref 1.4–7.0)
Neutrophils: 68 %
Platelets: 162 10*3/uL (ref 150–450)
RBC: 3.67 x10E6/uL — ABNORMAL LOW (ref 4.14–5.80)
RDW: 14 % (ref 11.6–15.4)
WBC: 6.8 10*3/uL (ref 3.4–10.8)

## 2021-07-20 LAB — TROPONIN T: Troponin T (Highly Sensitive): 29 ng/L (ref 0–22)

## 2021-07-20 LAB — PRO B NATRIURETIC PEPTIDE: NT-Pro BNP: 1863 pg/mL — ABNORMAL HIGH (ref 0–486)

## 2021-07-20 MED ORDER — DOXYCYCLINE HYCLATE 100 MG PO TABS: 100.0000 mg | ORAL_TABLET | Freq: Two times a day (BID) | ORAL | 0 refills | Status: DC

## 2021-07-23 ENCOUNTER — Other Ambulatory Visit: Payer: Self-pay | Admitting: Physician Assistant

## 2021-07-23 DIAGNOSIS — R0602 Shortness of breath: Secondary | ICD-10-CM

## 2021-07-23 DIAGNOSIS — I4891 Unspecified atrial fibrillation: Secondary | ICD-10-CM

## 2021-07-23 NOTE — Progress Notes (Signed)
Ar[place]ina P[place],acting as a scribe for Wilhemena Durie, MD.,have documented all relevant documentation on the behalf of Wilhemena Durie, MD,as directed by  Wilhemena Durie, MD while in the presence of Wilhemena Durie, MD.     Established patient visit   Patient: Jared Rice   DOB: 1933-02-22   88 y.o. Male  MRN: [id number] Visit Date: 07/24/2021  Today's healthcare provider: Wilhemena Durie, MD   No chief complaint on file.  Subjective    HPI  Patient is an 88 year old male who presents for 1 week follow up of shortness of breath.  Patient presented last week with shortness of breath and difficulty getting a deep breath.  Patient had labs that included Troponin of 29 and BNP of 1,863.  His TSH was 8.240, Met C and CBC were stable for him.  CXR revealed emphysema with bronchitic changes. Patient does not think he is doing any better.  His breathing is a little better but he still complains of swelling and diarrhea and just not feeling well. Patient was thinking his weight went up since last week but it is actually down 5 pounds. He is feeling about better overall and has no further orthopnea.  He is frustrated by about 6 months of "diarrhea" where he has 4-5 loose stools per day. Medications: Outpatient Medications Prior to Visit  Medication Sig   apixaban (ELIQUIS) 5 MG TABS tablet Take 1 tablet (5 mg total) by mouth 2 (two) times daily.   diphenhydramine-acetaminophen (TYLENOL PM) 25-500 MG TABS Take 2 tablets by mouth at bedtime.   doxazosin (CARDURA) 8 MG tablet Take 1 tablet (8 mg total) by mouth daily.   doxycycline (VIBRA-TABS) 100 MG tablet Take 1 tablet (100 mg total) by mouth 2 (two) times daily.   furosemide (LASIX) 40 MG tablet Take 1 tablet by mouth once daily (dose change)   Hydrocortisone-Iodoquinol (IODOQUINOL-HC EX) Apply 1 application topically daily as needed.   levothyroxine (SYNTHROID) 75 MCG tablet Take 1 tablet (75 mcg total) by mouth daily  before breakfast. Take 1 tablet by mouth daily in the morning   quinapril (ACCUPRIL) 5 MG tablet Take 1 tab once daily   rosuvastatin (CRESTOR) 20 MG tablet Patient takes 54m (Patient taking differently: Take 10 mg by mouth at bedtime.)   topiramate (TOPAMAX) 50 MG tablet Take 1 tablet by mouth twice daily   traMADol (ULTRAM) 50 MG tablet Take 1-2 tablets (50-100 mg total) by mouth every 6 (six) hours as needed for moderate pain.   No facility-administered medications prior to visit.    Review of Systems  Respiratory:  Positive for shortness of breath. Negative for wheezing.   Cardiovascular:  Positive for leg swelling. Negative for chest pain and palpitations.   Last CBC Lab Results  Component Value Date   WBC 6.8 07/19/2021   HGB 11.3 (L) 07/19/2021   HCT 34.6 (L) 07/19/2021   MCV 94 07/19/2021   MCH 30.8 07/19/2021   RDW 14.0 07/19/2021   PLT 162 07/19/2021       Objective    BP (!) 161/81 (BP Location: Right Arm, Patient Position: Sitting, Cuff Size: Normal)    Pulse 78    Temp 97.7 F (36.5 C) (Oral)    Wt 258 lb (117 kg)    SpO2 94%    BMI 34.04 kg/m  BP Readings from Last 3 Encounters:  07/24/21 (!) 161/81  07/19/21 (!) 144/100  03/15/21 131/60   Wt Readings  from Last 3 Encounters:  07/24/21 258 lb (117 kg)  07/19/21 263 lb 11.2 oz (119.6 kg)  03/15/21 254 lb (115.2 kg)      Physical Exam Vitals reviewed.  Constitutional:      Appearance: Normal appearance.  HENT:     Head: Normocephalic and atraumatic.     Right Ear: External ear normal.     Left Ear: External ear normal.  Eyes:     General: No scleral icterus.    Conjunctiva/sclera: Conjunctivae normal.  Cardiovascular:     Rate and Rhythm: Normal rate and regular rhythm.     Pulses: Normal pulses.     Heart sounds: Normal heart sounds.  Pulmonary:     Effort: Pulmonary effort is normal.     Breath sounds: Normal breath sounds.  Abdominal:     Tenderness: There is no abdominal tenderness.   Musculoskeletal:     Right lower leg: Edema present.     Left lower leg: Edema present.     Comments: He has 1+to 2+ lower extremity edema  Skin:    General: Skin is warm and dry.  Neurological:     General: No focal deficit present.     Mental Status: He is alert and oriented to person, place, and time.  Psychiatric:        Mood and Affect: Mood normal.        Behavior: Behavior normal.        Thought Content: Thought content normal.        Judgment: Judgment normal.      No results found for any visits on 07/24/21.  Assessment & Plan     1. Shortness of breath Continue full dose furosemide for another few days and then go back to half dose.  Renal panel next visit.  2. Atrial fibrillation, unspecified type ([place]) On Eliquis daily  3. Atherosclerosis of native coronary artery of native heart with stable angina pectoris ([place]) All risk factors treated I think mild troponin elevation due to stress from CHF and bronchitis.  Clinically better.  No work-up at this time. 4. Diarrhea, unspecified type No work-up as it is minimally symptomatic in this 88 year old.  At this time try Metamucil daily and a probiotic daily.  5. CAFL (chronic airflow limitation) ([place]) May be chronic bronchitis in addition to other issues stress his heart  6. Acute congestive heart failure, unspecified heart failure type (HCC) Mild heart failure, clinically better.  Consider following up on troponin and/or proBNP in the future if he clinically worsens. Patient does not desaturate on the walk around the office today. 7. Class 1 obesity due to excess calories with serious comorbidity and body mass index (BMI) of 33.0 to 33.9 in adult Patient comfortable.   No follow-ups on file.      I, Wilhemena Durie, MD, have reviewed all documentation for this visit. The documentation on 07/25/21 for the exam, diagnosis, procedures, and orders are all accurate and complete.    Keviana Guida Cranford Mon, MD   [facility] [phone] (phone) [phone] (fax)  [place]

## 2021-07-24 ENCOUNTER — Encounter: Payer: Self-pay | Admitting: Family Medicine

## 2021-07-24 ENCOUNTER — Ambulatory Visit (INDEPENDENT_AMBULATORY_CARE_PROVIDER_SITE_OTHER): Payer: PPO | Admitting: Family Medicine

## 2021-07-24 ENCOUNTER — Other Ambulatory Visit: Payer: Self-pay

## 2021-07-24 VITALS — BP 161/81 | HR 78 | Temp 97.7°F | Wt 258.0 lb

## 2021-07-24 DIAGNOSIS — I25118 Atherosclerotic heart disease of native coronary artery with other forms of angina pectoris: Secondary | ICD-10-CM

## 2021-07-24 DIAGNOSIS — J449 Chronic obstructive pulmonary disease, unspecified: Secondary | ICD-10-CM | POA: Diagnosis not present

## 2021-07-24 DIAGNOSIS — I4891 Unspecified atrial fibrillation: Secondary | ICD-10-CM | POA: Diagnosis not present

## 2021-07-24 DIAGNOSIS — R0602 Shortness of breath: Secondary | ICD-10-CM | POA: Diagnosis not present

## 2021-07-24 DIAGNOSIS — R197 Diarrhea, unspecified: Secondary | ICD-10-CM | POA: Diagnosis not present

## 2021-07-24 DIAGNOSIS — E6609 Other obesity due to excess calories: Secondary | ICD-10-CM | POA: Diagnosis not present

## 2021-07-24 DIAGNOSIS — Z6833 Body mass index (BMI) 33.0-33.9, adult: Secondary | ICD-10-CM

## 2021-07-24 DIAGNOSIS — I509 Heart failure, unspecified: Secondary | ICD-10-CM

## 2021-07-24 NOTE — Patient Instructions (Signed)
Cut furosemide dose back in half in 1 week. For diarrhea try Metamucil every morning and a probiotic over-the-counter daily

## 2021-07-26 DIAGNOSIS — Z8709 Personal history of other diseases of the respiratory system: Secondary | ICD-10-CM | POA: Diagnosis not present

## 2021-07-26 DIAGNOSIS — G250 Essential tremor: Secondary | ICD-10-CM | POA: Diagnosis not present

## 2021-07-26 DIAGNOSIS — I4819 Other persistent atrial fibrillation: Secondary | ICD-10-CM | POA: Diagnosis not present

## 2021-07-26 DIAGNOSIS — I10 Essential (primary) hypertension: Secondary | ICD-10-CM | POA: Diagnosis not present

## 2021-08-08 ENCOUNTER — Other Ambulatory Visit: Payer: Self-pay | Admitting: Family Medicine

## 2021-08-08 MED ORDER — RAMIPRIL 5 MG PO CAPS: 5.0000 mg | ORAL_CAPSULE | Freq: Every day | ORAL | 0 refills | Status: DC

## 2021-08-13 DIAGNOSIS — L97511 Non-pressure chronic ulcer of other part of right foot limited to breakdown of skin: Secondary | ICD-10-CM | POA: Diagnosis not present

## 2021-08-13 DIAGNOSIS — B351 Tinea unguium: Secondary | ICD-10-CM | POA: Diagnosis not present

## 2021-08-13 DIAGNOSIS — I739 Peripheral vascular disease, unspecified: Secondary | ICD-10-CM | POA: Diagnosis not present

## 2021-08-13 DIAGNOSIS — E1142 Type 2 diabetes mellitus with diabetic polyneuropathy: Secondary | ICD-10-CM | POA: Diagnosis not present

## 2021-08-14 ENCOUNTER — Ambulatory Visit (INDEPENDENT_AMBULATORY_CARE_PROVIDER_SITE_OTHER): Payer: PPO | Admitting: Family Medicine

## 2021-08-14 ENCOUNTER — Other Ambulatory Visit: Payer: Self-pay

## 2021-08-14 ENCOUNTER — Encounter: Payer: Self-pay | Admitting: Family Medicine

## 2021-08-14 VITALS — BP 143/72 | HR 62 | Temp 98.4°F | Resp 18 | Ht 74.0 in | Wt 255.0 lb

## 2021-08-14 DIAGNOSIS — I509 Heart failure, unspecified: Secondary | ICD-10-CM | POA: Diagnosis not present

## 2021-08-14 DIAGNOSIS — I10 Essential (primary) hypertension: Secondary | ICD-10-CM | POA: Diagnosis not present

## 2021-08-14 DIAGNOSIS — E785 Hyperlipidemia, unspecified: Secondary | ICD-10-CM

## 2021-08-14 DIAGNOSIS — N189 Chronic kidney disease, unspecified: Secondary | ICD-10-CM

## 2021-08-14 DIAGNOSIS — R7303 Prediabetes: Secondary | ICD-10-CM

## 2021-08-14 NOTE — Progress Notes (Signed)
?  ? ? ?Established patient visit ? ?I,[name],acting as a scribe for Wilhemena Durie, MD.,have documented all relevant documentation on the behalf of Wilhemena Durie, MD,as directed by  Wilhemena Durie, MD while in the presence of Wilhemena Durie, MD. ? ? ?Patient: Jared Rice   DOB: 1933-01-29   88 y.o. Male  MRN: [id number] ?Visit Date: 08/14/2021 ? ?Today's healthcare provider: Wilhemena Durie, MD  ? ?Chief Complaint  ?Patient presents with  ? Follow-up  ? Hypertension  ? Hyperlipidemia  ? Hypothyroidism  ? Prediabetes  ? ?Subjective  ?  ?HPI  ?Patient has been switched from quinapril to ramipril due to lack of availability of quinapril.  May need to rapidly escalate the dose of the ramipril.  Overall he feels well and has no complaints. ?He has had no recent falls.  No chest pain or shortness of breath.  His legs are doing well. ?Hypertension, follow-up ? ?BP Readings from Last 3 Encounters:  ?08/14/21 (!) 143/72  ?07/24/21 (!) 161/81  ?07/19/21 (!) 144/100  ? Wt Readings from Last 3 Encounters:  ?08/14/21 255 lb (115.7 kg)  ?07/24/21 258 lb (117 kg)  ?07/19/21 263 lb 11.2 oz (119.6 kg)  ?  ? ?He was last seen for hypertension 06/22/2020.  ?BP at that visit was 138/94. ?Management since that visit includes; Under fair control on quinapril 20. ?He reports good compliance with treatment. ?He is not having side effects. none ?He is not exercising. ?He is adherent to low salt diet.   ?Outside blood pressures are not checking. ? ?He does not smoke. ? ?Use of agents associated with hypertension: none.  ? ?--------------------------------------------------------------------------------------------------- ?Lipid/Cholesterol, follow-up ? ?Last Lipid Panel: ?Lab Results  ?Component Value Date  ? CHOL 100 07/05/2020  ? LDLCALC 51 07/05/2020  ? HDL 39 (L) 07/05/2020  ? TRIG 36 07/05/2020  ? ? ?He was last seen for this 13 months ago.  ?Management since that visit includes; on rosuvastatin. ? ?He  reports good compliance with treatment. ?He is not having side effects. none ? ?He is following a Regular, Low Sodium diet. ?Current exercise: none ? ?Last metabolic panel ?Lab Results  ?Component Value Date  ? GLUCOSE 82 07/19/2021  ? NA 142 07/19/2021  ? K 4.4 07/19/2021  ? BUN 20 07/19/2021  ? CREATININE 0.97 07/19/2021  ? EGFR 76 07/19/2021  ? GFRNONAA >60 08/24/2020  ? CALCIUM 9.0 07/19/2021  ? AST 10 07/19/2021  ? ALT 6 07/19/2021  ? ?The ASCVD Risk score (Arnett DK, et al., 2019) failed to calculate for the following reasons: ?  The 2019 ASCVD risk score is only valid for ages 62 to 88 ? ?--------------------------------------------------------------------------------------------------- ?Prediabetes, Follow-up ? ?Lab Results  ?Component Value Date  ? HGBA1C 5.9 (H) 07/05/2020  ? HGBA1C 6.2 (A) 10/20/2019  ? HGBA1C 6.3 (H) 06/25/2019  ? GLUCOSE 82 07/19/2021  ? GLUCOSE 89 02/07/2021  ? GLUCOSE 100 (H) 11/03/2020  ? ? ?Last seen for for this 13 months ago.  ?Management since that visit includes; labs checked showing-stable. ?Current symptoms include none and have been unchanged. ? ?Prior visit with dietician: no ?Current diet: well balanced ?Current exercise: none ? ?Pertinent Labs: ?   ?Component Value Date/Time  ? CHOL 100 07/05/2020 1031  ? TRIG 36 07/05/2020 1031  ? CHOLHDL 2.6 07/05/2020 1031  ? CREATININE 0.97 07/19/2021 1556  ? CREATININE 0.87 10/13/2013 1055  ? ? ?Wt Readings from Last 3 Encounters:  ?08/14/21 255 lb (115.7 kg)  ?  07/24/21 258 lb (117 kg)  ?07/19/21 263 lb 11.2 oz (119.6 kg)  ? ? ?----------------------------------------------------------------------------------------- ?Hypothyroid, follow-up ? ?Lab Results  ?Component Value Date  ? TSH 8.240 (H) 07/19/2021  ? TSH 3.840 07/05/2020  ? TSH 4.130 06/25/2019  ? ? ?Wt Readings from Last 3 Encounters:  ?08/14/21 255 lb (115.7 kg)  ?07/24/21 258 lb (117 kg)  ?07/19/21 263 lb 11.2 oz (119.6 kg)  ? ? ?He was last seen for hypothyroid 1 months  ago.  ?Management since that visit includes; labs checked. ?He reports good compliance with treatment. ?He is not having side effects. none ? ?----------------------------------------------------------------------------------------- ? ? ?Medications: ?Outpatient Medications Prior to Visit  ?Medication Sig  ? apixaban (ELIQUIS) 5 MG TABS tablet Take 1 tablet (5 mg total) by mouth 2 (two) times daily.  ? diphenhydramine-acetaminophen (TYLENOL PM) 25-500 MG TABS Take 2 tablets by mouth at bedtime.  ? doxazosin (CARDURA) 8 MG tablet Take 1 tablet (8 mg total) by mouth daily.  ? doxycycline (VIBRA-TABS) 100 MG tablet Take 1 tablet (100 mg total) by mouth 2 (two) times daily.  ? furosemide (LASIX) 40 MG tablet Take 1 tablet by mouth once daily (dose change)  ? Hydrocortisone-Iodoquinol (IODOQUINOL-HC EX) Apply 1 application topically daily as needed.  ? levothyroxine (SYNTHROID) 75 MCG tablet Take 1 tablet (75 mcg total) by mouth daily before breakfast. Take 1 tablet by mouth daily in the morning  ? ramipril (ALTACE) 5 MG capsule Take 1 capsule (5 mg total) by mouth daily.  ? rosuvastatin (CRESTOR) 20 MG tablet Patient takes 3m (Patient taking differently: Take 10 mg by mouth at bedtime.)  ? topiramate (TOPAMAX) 50 MG tablet Take 1 tablet by mouth twice daily  ? traMADol (ULTRAM) 50 MG tablet Take 1-2 tablets (50-100 mg total) by mouth every 6 (six) hours as needed for moderate pain.  ? ?No facility-administered medications prior to visit.  ? ? ?Review of Systems  ?Constitutional:  Negative for appetite change, chills and fever.  ?Respiratory:  Negative for chest tightness, shortness of breath and wheezing.   ?Cardiovascular:  Negative for chest pain and palpitations.  ?Gastrointestinal:  Negative for abdominal pain, nausea and vomiting.  ? ?Last metabolic panel ?Lab Results  ?Component Value Date  ? GLUCOSE 82 07/19/2021  ? NA 142 07/19/2021  ? K 4.4 07/19/2021  ? CL 107 (H) 07/19/2021  ? CO2 25 07/19/2021  ? BUN 20  07/19/2021  ? CREATININE 0.97 07/19/2021  ? EGFR 76 07/19/2021  ? CALCIUM 9.0 07/19/2021  ? PHOS 3.3 12/25/2016  ? PROT 6.1 07/19/2021  ? ALBUMIN 4.1 07/19/2021  ? LABGLOB 2.0 07/19/2021  ? AGRATIO 2.1 07/19/2021  ? BILITOT 0.7 07/19/2021  ? ALKPHOS 74 07/19/2021  ? AST 10 07/19/2021  ? ALT 6 07/19/2021  ? ANIONGAP 9 08/24/2020  ? ?  ?  Objective  ?  ?BP (!) 143/72 (BP Location: Right Arm, Patient Position: Sitting, Cuff Size: Large)   Pulse 62   Temp 98.4 ?F (36.9 ?C) (Temporal)   Resp 18   Ht 6' 2"  (1.88 m)   Wt 255 lb (115.7 kg)   SpO2 94%   BMI 32.74 kg/m?  ?BP Readings from Last 3 Encounters:  ?08/14/21 (!) 143/72  ?07/24/21 (!) 161/81  ?07/19/21 (!) 144/100  ? ?Wt Readings from Last 3 Encounters:  ?08/14/21 255 lb (115.7 kg)  ?07/24/21 258 lb (117 kg)  ?07/19/21 263 lb 11.2 oz (119.6 kg)  ? ?  ? ?Physical Exam ?Vitals reviewed.  ?  Constitutional:   ?   Appearance: Normal appearance.  ?HENT:  ?   Head: Normocephalic and atraumatic.  ?   Right Ear: External ear normal.  ?   Left Ear: External ear normal.  ?Eyes:  ?   General: No scleral icterus. ?   Conjunctiva/sclera: Conjunctivae normal.  ?Cardiovascular:  ?   Rate and Rhythm: Normal rate and regular rhythm.  ?   Pulses: Normal pulses.  ?   Heart sounds: Normal heart sounds.  ?Pulmonary:  ?   Effort: Pulmonary effort is normal.  ?   Breath sounds: Normal breath sounds.  ?Abdominal:  ?   Tenderness: There is no abdominal tenderness.  ?Musculoskeletal:  ?   Right lower leg: Edema present.  ?   Left lower leg: Edema present.  ?   Comments: He has 1+to 2+ lower extremity edema  ?Skin: ?   General: Skin is warm and dry.  ?Neurological:  ?   General: No focal deficit present.  ?   Mental Status: He is alert and oriented to person, place, and time.  ?Psychiatric:     ?   Mood and Affect: Mood normal.     ?   Behavior: Behavior normal.     ?   Thought Content: Thought content normal.     ?   Judgment: Judgment normal.  ?  ? ? ?No results found for any visits on  08/14/21. ? Assessment & Plan  ?  ? ?1. Essential hypertension ?May need to go up on ramipril dose.  Follow clinically ?- Comprehensive Metabolic Panel (CMET) ? ?2. Hyperlipidemia, unspecified hyperlipidemia type ?On

## 2021-08-15 DIAGNOSIS — I509 Heart failure, unspecified: Secondary | ICD-10-CM | POA: Diagnosis not present

## 2021-08-15 DIAGNOSIS — E785 Hyperlipidemia, unspecified: Secondary | ICD-10-CM | POA: Diagnosis not present

## 2021-08-15 DIAGNOSIS — I10 Essential (primary) hypertension: Secondary | ICD-10-CM | POA: Diagnosis not present

## 2021-08-15 DIAGNOSIS — N189 Chronic kidney disease, unspecified: Secondary | ICD-10-CM | POA: Diagnosis not present

## 2021-08-15 DIAGNOSIS — R7303 Prediabetes: Secondary | ICD-10-CM | POA: Diagnosis not present

## 2021-08-16 LAB — LIPID PANEL
Chol/HDL Ratio: 2.5 ratio (ref 0.0–5.0)
Cholesterol, Total: 107 mg/dL (ref 100–199)
HDL: 42 mg/dL (ref >39)
LDL Chol Calc (NIH): 55 mg/dL (ref 0–99)
Triglycerides: 35 mg/dL (ref 0–149)
VLDL Cholesterol Cal: 10 mg/dL (ref 5–40)

## 2021-08-16 LAB — COMPREHENSIVE METABOLIC PANEL
ALT: 7 IU/L (ref 0–44)
AST: 11 IU/L (ref 0–40)
Albumin/Globulin Ratio: 2 (ref 1.2–2.2)
Albumin: 3.9 g/dL (ref 3.6–4.6)
Alkaline Phosphatase: 63 IU/L (ref 44–121)
BUN/Creatinine Ratio: 18 (ref 10–24)
BUN: 24 mg/dL (ref 8–27)
Bilirubin Total: 0.3 mg/dL (ref 0.0–1.2)
CO2: 26 mmol/L (ref 20–29)
Calcium: 8.7 mg/dL (ref 8.6–10.2)
Chloride: 108 mmol/L — ABNORMAL HIGH (ref 96–106)
Creatinine, Ser: 1.3 mg/dL — ABNORMAL HIGH (ref 0.76–1.27)
Globulin, Total: 2 g/dL (ref 1.5–4.5)
Glucose: 90 mg/dL (ref 70–99)
Potassium: 4 mmol/L (ref 3.5–5.2)
Sodium: 144 mmol/L (ref 134–144)
Total Protein: 5.9 g/dL — ABNORMAL LOW (ref 6.0–8.5)
eGFR: 54 mL/min/{1.73_m2} — ABNORMAL LOW (ref >59)

## 2021-08-16 LAB — HEMOGLOBIN A1C
Est. average glucose Bld gHb Est-mCnc: 120 mg/dL
Hgb A1c MFr Bld: 5.8 % — ABNORMAL HIGH (ref 4.8–5.6)

## 2021-09-03 ENCOUNTER — Telehealth: Payer: Self-pay

## 2021-09-03 NOTE — Telephone Encounter (Signed)
Patient left nurse voicemail inquiring about other treatments for intertrigo. He was last here in february and currently uses the skin medicinals compounded cream. Patient is asking if there is anything oral he can take to help this condition?  ?

## 2021-09-04 MED ORDER — FLUCONAZOLE 200 MG PO TABS: 200.0000 mg | ORAL_TABLET | ORAL | 2 refills | Status: DC

## 2021-09-04 NOTE — Telephone Encounter (Signed)
RX sent in and patient advised of information. aw ?

## 2021-09-08 ENCOUNTER — Other Ambulatory Visit: Payer: Self-pay | Admitting: Family Medicine

## 2021-09-10 ENCOUNTER — Other Ambulatory Visit: Payer: Self-pay | Admitting: Pharmacist Clinician (PhC)/ Clinical Pharmacy Specialist

## 2021-09-10 DIAGNOSIS — I8393 Asymptomatic varicose veins of bilateral lower extremities: Secondary | ICD-10-CM | POA: Diagnosis not present

## 2021-09-10 DIAGNOSIS — Z8679 Personal history of other diseases of the circulatory system: Secondary | ICD-10-CM | POA: Diagnosis not present

## 2021-09-10 DIAGNOSIS — I2510 Atherosclerotic heart disease of native coronary artery without angina pectoris: Secondary | ICD-10-CM | POA: Diagnosis not present

## 2021-09-10 DIAGNOSIS — D692 Other nonthrombocytopenic purpura: Secondary | ICD-10-CM | POA: Diagnosis not present

## 2021-09-10 DIAGNOSIS — I739 Peripheral vascular disease, unspecified: Secondary | ICD-10-CM | POA: Diagnosis not present

## 2021-09-10 DIAGNOSIS — I4819 Other persistent atrial fibrillation: Secondary | ICD-10-CM | POA: Diagnosis not present

## 2021-09-10 DIAGNOSIS — Z87891 Personal history of nicotine dependence: Secondary | ICD-10-CM | POA: Diagnosis not present

## 2021-09-10 DIAGNOSIS — J449 Chronic obstructive pulmonary disease, unspecified: Secondary | ICD-10-CM | POA: Diagnosis not present

## 2021-09-10 DIAGNOSIS — Z7901 Long term (current) use of anticoagulants: Secondary | ICD-10-CM | POA: Diagnosis not present

## 2021-09-10 DIAGNOSIS — I10 Essential (primary) hypertension: Secondary | ICD-10-CM | POA: Diagnosis not present

## 2021-09-10 DIAGNOSIS — G250 Essential tremor: Secondary | ICD-10-CM | POA: Diagnosis not present

## 2021-09-10 MED ORDER — APIXABAN 5 MG PO TABS: 5.0000 mg | ORAL_TABLET | Freq: Two times a day (BID) | ORAL | 1 refills | Status: DC

## 2021-09-10 NOTE — Telephone Encounter (Signed)
Future visit in 3 months  ?Requested Prescriptions  ?Pending Prescriptions Disp Refills  ?? ramipril (ALTACE) 5 MG capsule [Pharmacy Med Name: Ramipril 5 MG Oral Capsule] 30 capsule 0  ?  Sig: Take 1 capsule by mouth once daily  ?  ? Cardiovascular:  ACE Inhibitors Failed - 09/08/2021  1:30 PM  ?  ?  Failed - Cr in normal range and within 180 days  ?  Creatinine  ?Date Value Ref Range Status  ?10/13/2013 0.87 0.60 - 1.30 mg/dL Final  ? ?Creatinine, Ser  ?Date Value Ref Range Status  ?08/15/2021 1.30 (H) 0.76 - 1.27 mg/dL Final  ?   ?  ?  Failed - Last BP in normal range  ?  BP Readings from Last 1 Encounters:  ?08/14/21 (!) 143/72  ?   ?  ?  Passed - K in normal range and within 180 days  ?  Potassium  ?Date Value Ref Range Status  ?08/15/2021 4.0 3.5 - 5.2 mmol/L Final  ?04/27/2014 3.7 3.5 - 5.1 mmol/L Final  ?   ?  ?  Passed - Patient is not pregnant  ?  ?  Passed - Valid encounter within last 6 months  ?  Recent Outpatient Visits   ?      ? 3 weeks ago Essential hypertension  ? [facility] Jerrol Banana., MD  ? 1 month ago Shortness of breath  ? [facility] Jerrol Banana., MD  ? 1 month ago Shortness of breath  ? [facility] [place], [place], [place]  ? 5 months ago Generalized abdominal pain  ? [facility] Jerrol Banana., MD  ? 6 months ago Generalized abdominal pain  ? [facility] Jerrol Ban[facility]., [facility]  ?  ?  ?Future Appointments   ?        ? In 3 months Jerrol Banana., MD [facility]  ? In 3 months Ralene Bathe, MD [place]  ?  ? ?  ?  ?  ? ?

## 2021-09-10 NOTE — Telephone Encounter (Signed)
88 M 115.7 kg, SCr 1.30.  LOV [place] 10/2020 ? ?Refill Eliquis 5 mg to [facility] ?

## 2021-09-11 ENCOUNTER — Telehealth: Payer: Self-pay | Admitting: Cardiology

## 2021-09-11 DIAGNOSIS — Z96651 Presence of right artificial knee joint: Secondary | ICD-10-CM | POA: Diagnosis not present

## 2021-09-11 DIAGNOSIS — Z96652 Presence of left artificial knee joint: Secondary | ICD-10-CM | POA: Diagnosis not present

## 2021-09-11 DIAGNOSIS — Z96653 Presence of artificial knee joint, bilateral: Secondary | ICD-10-CM | POA: Diagnosis not present

## 2021-09-11 NOTE — Telephone Encounter (Signed)
Patient calling the office for samples of medication: ? ? ?1.  What medication and dosage are you requesting samples for?apixaban (ELIQUIS) 5 MG TABS tablet ? ?2.  Are you currently out of this medication? Patient is in the donut hole ?  ?

## 2021-09-12 ENCOUNTER — Telehealth: Payer: Self-pay | Admitting: Pharmacist Clinician (PhC)/ Clinical Pharmacy Specialist

## 2021-09-12 ENCOUNTER — Other Ambulatory Visit: Payer: Self-pay | Admitting: Family Medicine

## 2021-09-12 NOTE — Telephone Encounter (Signed)
Requested Prescriptions  ?Pending Prescriptions Disp Refills  ?? rosuvastatin (CRESTOR) 10 MG tablet [Pharmacy Med Name: ROSUVASTATIN CALCIUM 10 MG TAB] 90 tablet 3  ?  Sig: Take 1 tablet by mouth once daily  ?  ? Cardiovascular:  Antilipid - Statins 2 Failed - 09/12/2021 11:58 AM  ?  ?  Failed - Cr in normal range and within 360 days  ?  Creatinine  ?Date Value Ref Range Status  ?10/13/2013 0.87 0.60 - 1.30 mg/dL Final  ? ?Creatinine, Ser  ?Date Value Ref Range Status  ?08/15/2021 1.30 (H) 0.76 - 1.27 mg/dL Final  ?   ?  ?  Failed - Lipid Panel in normal range within the last 12 months  ?  Cholesterol, Total  ?Date Value Ref Range Status  ?08/15/2021 107 100 - 199 mg/dL Final  ? ?LDL Chol Calc (NIH)  ?Date Value Ref Range Status  ?08/15/2021 55 0 - 99 mg/dL Final  ? ?HDL  ?Date Value Ref Range Status  ?08/15/2021 42 >39 mg/dL Final  ? ?Triglycerides  ?Date Value Ref Range Status  ?08/15/2021 35 0 - 149 mg/dL Final  ? ?  ?  ?  Passed - Patient is not pregnant  ?  ?  Passed - Valid encounter within last 12 months  ?  Recent Outpatient Visits   ?      ? 4 weeks ago Essential hypertension  ? [facility] Jerrol Banana., MD  ? 1 month ago Shortness of breath  ? [facility] Jerrol Banana., MD  ? 1 month ago Shortness of breath  ? [facility] [place], [place], [place]  ? 6 months ago Generalized abdominal pain  ? [facility] Jerrol Banana., MD  ? 7 months ago Generalized abdominal pain  ? [facility] Jerrol Banana., MD  ?  ?  ?Future Appointments   ?        ? In 3 months Jerrol Banana., MD [facility]  ? In 3 months Ralene Bathe, MD [place]  ?  ? ?  ?  ?  ? ?

## 2021-09-12 NOTE — Telephone Encounter (Signed)
Spoke with patient about option of clinical drug trial for asundexian (Factor X! Inhibitor), as he has difficulty paying for his Eliquis.  He is interested in learning about this study.   Will reach out to Dr. [place] to be sure he doesn't have any concerns.  If not, I will forward his information to [facility].  ?

## 2021-09-12 NOTE — Telephone Encounter (Signed)
88 M SCr 1.3, 115.7 kg, LOV [place] 6/22 ? ?Ok to give Eliquis 5 mg samples, will speak with patient about patient assistance as well ?

## 2021-09-17 ENCOUNTER — Ambulatory Visit (INDEPENDENT_AMBULATORY_CARE_PROVIDER_SITE_OTHER): Payer: PPO

## 2021-09-17 ENCOUNTER — Telehealth: Payer: Self-pay

## 2021-09-17 DIAGNOSIS — E785 Hyperlipidemia, unspecified: Secondary | ICD-10-CM

## 2021-09-17 DIAGNOSIS — I10 Essential (primary) hypertension: Secondary | ICD-10-CM

## 2021-09-17 DIAGNOSIS — N390 Urinary tract infection, site not specified: Secondary | ICD-10-CM | POA: Diagnosis not present

## 2021-09-17 DIAGNOSIS — R197 Diarrhea, unspecified: Secondary | ICD-10-CM | POA: Diagnosis not present

## 2021-09-17 NOTE — Telephone Encounter (Signed)
Copied from [place] [id number]. Topic: General - Other ?>> Sep 17, 2021 12:21 PM Pawlus, Brayton Layman A wrote: ?Reason for CRM: [facility] RN stated she started the pt Cipro '500mg'$  twice a day for 5 days for UTI.  ?[phone]. ?

## 2021-09-17 NOTE — Progress Notes (Signed)
Chronic Care Management Pharmacy Note  09/19/2021 Name:  Jared Rice MRN:  [id number] DOB:  12-14-1932  Summary: Patient presents for CCM follow-up.  -Given recent borderline hypotension readings, will have patient continue to monitor blood pressure and hypotension symptoms, will adjust medications as needed.  Recommendations/Changes made from today's visit: Continue current medications  Plan: CPP follow-up in 2 months   Subjective: Jared Rice is an 88 y.o. year old male who is a primary patient of Rosanna Randy, Retia Passe., MD.  The CCM team was consulted for assistance with disease management and care coordination needs.    Engaged with patient by telephone for follow up visit in response to provider referral for pharmacy case management and/or care coordination services.   Consent to Services:  The patient was given information about Chronic Care Management services, agreed to services, and gave verbal consent prior to initiation of services.  Please see initial visit note for detailed documentation.   Patient Care Team: Jerrol Banana., MD as PCP - General (Unknown Physician Specialty) Martinique, Peter M, MD as PCP - Cardiology (Cardiology) Dingeldein, Remo Lipps, MD as Consulting Physician (Ophthalmology) Ralene Bathe, MD (Dermatology) Marry Guan Laurice Record, MD (Orthopedic Surgery) Caroline More, DPM as Consulting Physician (Podiatry) Germaine Pomfret, [facility] as Pharmacist (Pharmacist)  Recent office visits: 08/14/21: patient presented to Dr. Rosanna Randy for follow-up.   Recent consult visits: 11/14/20: Patient presented to Dr. Martinique (Cardiology) for follow-up. Celecoxib, ondansetron, oxycodone stopped.   Hospital visits: 09/06/20: Patient hospitalized for total knee replacement.    Objective:  Lab Results  Component Value Date   CREATININE 1.30 (H) 08/15/2021   BUN 24 08/15/2021   GFRNONAA >60 08/24/2020   GFRAA 74 07/05/2020   NA 144 08/15/2021   K 4.0  08/15/2021   CALCIUM 8.7 08/15/2021   CO2 26 08/15/2021   GLUCOSE 90 08/15/2021    Lab Results  Component Value Date/Time   HGBA1C 5.8 (H) 08/15/2021 08:42 AM   HGBA1C 5.9 (H) 07/05/2020 10:31 AM    Last diabetic Eye exam:  Lab Results  Component Value Date/Time   HMDIABEYEEXA No Retinopathy 04/27/2019 12:00 AM    Last diabetic Foot exam: No results found for: HMDIABFOOTEX   Lab Results  Component Value Date   CHOL 107 08/15/2021   HDL 42 08/15/2021   LDLCALC 55 08/15/2021   TRIG 35 08/15/2021   CHOLHDL 2.5 08/15/2021       Latest Ref Rng & Units 08/15/2021    8:45 AM 07/19/2021    3:56 PM 02/07/2021   10:37 AM  Hepatic Function  Total Protein 6.0 - 8.5 g/dL 5.9   6.1   5.7    Albumin 3.6 - 4.6 g/dL 3.9   4.1   3.7    AST 0 - 40 IU/L 11   10   12     ALT 0 - 44 IU/L 7   6   6     Alk Phosphatase 44 - 121 IU/L 63   74   71    Total Bilirubin 0.0 - 1.2 mg/dL 0.3   0.7   0.5      Lab Results  Component Value Date/Time   TSH 8.240 (H) 07/19/2021 03:56 PM   TSH 3.840 07/05/2020 10:31 AM       Latest Ref Rng & Units 07/19/2021    3:56 PM 02/07/2021   10:37 AM 08/24/2020   12:18 PM  CBC  WBC 3.4 - 10.8 x10E3/uL 6.8  4.4   7.2    Hemoglobin 13.0 - 17.7 g/dL 11.3   10.5   11.6    Hematocrit 37.5 - 51.0 % 34.6   31.5   37.3    Platelets 150 - 450 x10E3/uL 162   170   173      No results found for: VD25OH  Clinical ASCVD: No  The ASCVD Risk score (Arnett DK, et al., 2019) failed to calculate for the following reasons:   The 2019 ASCVD risk score is only valid for ages 101 to 33       06/05/2021    3:08 PM 05/31/2020    3:00 PM 03/20/2020   12:55 PM  Depression screen PHQ 2/9  Decreased Interest 0 0 0  Down, Depressed, Hopeless 0 0 0  PHQ - 2 Score 0 0 0    Social History   Tobacco Use  Smoking Status Former   Packs/day: 2.00   Years: 31.00   Pack years: 62.00   Types: Cigarettes   Quit date: 12/24/1980   Years since quitting: 40.7  Smokeless Tobacco Never   Tobacco Comments   quit in 1982   BP Readings from Last 3 Encounters:  08/14/21 (!) 143/72  07/24/21 (!) 161/81  07/19/21 (!) 144/100   Pulse Readings from Last 3 Encounters:  08/14/21 62  07/24/21 78  07/19/21 81   Wt Readings from Last 3 Encounters:  08/14/21 255 lb (115.7 kg)  07/24/21 258 lb (117 kg)  07/19/21 263 lb 11.2 oz (119.6 kg)   BMI Readings from Last 3 Encounters:  08/14/21 32.74 kg/m  07/24/21 34.04 kg/m  07/19/21 34.79 kg/m    Assessment/Interventions: Review of patient past medical history, allergies, medications, health status, including review of consultants reports, laboratory and other test data, was performed as part of comprehensive evaluation and provision of chronic care management services.   SDOH:  (Social Determinants of Health) assessments and interventions performed: Yes   SDOH Screenings   Alcohol Screen: Low Risk    Last Alcohol Screening Score (AUDIT): 0  Depression (PHQ2-9): Low Risk    PHQ-2 Score: 0  Financial Resource Strain: Low Risk    Difficulty of Paying Living Expenses: Not hard at all  Food Insecurity: No Food Insecurity   Worried About Charity fundraiser in the Last Year: Never true   Ran Out of Food in the Last Year: Never true  Housing: Low Risk    Last Housing Risk Score: 0  Physical Activity: Inactive   Days of Exercise per Week: 0 days   Minutes of Exercise per Session: 0 min  Social Connections: Moderately Integrated   Frequency of Communication with Friends and Family: More than three times a week   Frequency of Social Gatherings with Friends and Family: More than three times a week   Attends Religious Services: Never   Marine scientist or Organizations: Yes   Attends Music therapist: More than 4 times per year   Marital Status: Married  Stress: No Stress Concern Present   Feeling of Stress : Not at all  Tobacco Use: Medium Risk   Smoking Tobacco Use: Former   Smokeless Tobacco Use:  Never   Passive Exposure: Not on Pensions consultant Needs: No Transportation Needs   Lack of Transportation (Medical): No   Lack of Transportation (Non-Medical): No    CCM Care Plan  Allergies  Allergen Reactions   Dexilant [Dexlansoprazole] Rash   Primidone Itching and Rash  Medications Reviewed Today     Reviewed by Julieta Bellini, CMA (Certified Medical Assistant) on 08/14/21 at 1444  Med List Status: <None>   Medication Order Taking? Sig Documenting Provider Last Dose Status Informant  apixaban (ELIQUIS) 5 MG TABS tablet [id number] Yes Take 1 tablet (5 mg total) by mouth 2 (two) times daily. Martinique, Peter M, MD Taking Active   diphenhydramine-acetaminophen (TYLENOL PM) 25-500 MG TABS [id number] Yes Take 2 tablets by mouth at bedtime. [provider] Taking Active Self  doxazosin (CARDURA) 8 MG tablet [id number] Yes Take 1 tablet (8 mg total) by mouth daily. Jerrol Banana., MD Taking Active   doxycycline (VIBRA-TABS) 100 MG tablet [id number] Yes Take 1 tablet (100 mg total) by mouth 2 (two) times daily. Mardene Speak, PA-C Taking Active   furosemide (LASIX) 40 MG tablet [id number] Yes Take 1 tablet by mouth once daily (dose change) Jerrol Banana., MD Taking Active   Hydrocortisone-Iodoquinol [facility]) [id number] Yes Apply 1 application topically daily as needed. [provider] Taking Active   levothyroxine (SYNTHROID) 75 MCG tablet [id number] Yes Take 1 tablet (75 mcg total) by mouth daily before breakfast. Take 1 tablet by mouth daily in the morning Jerrol Banana., MD Taking Active   ramipril (ALTACE) 5 MG capsule [id number] Yes Take 1 capsule (5 mg total) by mouth daily. Jerrol Banana., MD Taking Active   rosuvastatin (CRESTOR) 20 MG tablet [id number] Yes Patient takes 56m  Patient taking differently: Take 10 mg by mouth at bedtime.   GJerrol Banana, MD Taking Active   topiramate (TOPAMAX) 50 MG tablet [id number]  Take 1 tablet by mouth twice daily Fisher, DKirstie Peri MD Taking Active   traMADol (Veatrice Bourbon 50 MG tablet [id number] Take 1-2 tablets (50-100 mg total) by mouth every 6 (six) hours as needed for moderate pain. MLattie Corns PA-C Taking Active             Patient Active Problem List   Diagnosis Date Noted   Status post total right knee replacement 10/19/2020   Total knee replacement status 09/06/2020   Atrial fibrillation ([place] 05/12/2020   Urinary frequency 11/22/2019   Leukocytes in urine 11/22/2019   Skin tear of forearm without complication, initial encounter- left 11/22/2019   Wound cellulitis- right lower leg anterior  10/29/2019   Leg wound, right, sequela 10/29/2019   Chronic fatigue 11/29/2015   Benign prostatic hyperplasia 02/22/2015   Gastrointestinal ulcer    History of peptic ulcer    Hematochezia    Acute gastric ulcer    Melena 11/12/2014   Malignant neoplasm of skin 09/21/2014   Cataract 09/21/2014   Colon polyp 09/21/2014   CAFL (chronic airflow limitation) ([place] 09/21/2014   Diabetes ([place] 09/21/2014   DD (diverticular disease) 09/21/2014   Benign essential tremor 09/21/2014   Personal history of tobacco use, presenting hazards to health 09/21/2014   Acid reflux 09/21/2014   Hemorrhoids 09/21/2014   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   HLD (hyperlipidemia) 09/21/2014   BP (high blood pressure) 09/21/2014   Hypothyroidism 09/21/2014   Lymphoma ([place] 09/21/2014   Malaise and fatigue 09/21/2014   Mononeuritis 09/21/2014   Muscle ache 09/21/2014   Neuropathy 09/21/2014   Numbness and tingling 09/21/2014   Arthritis, degenerative 09/21/2014   Awareness of heartbeats 09/21/2014   Borderline diabetes 09/21/2014   Rotator cuff syndrome 09/21/2014   Has a tremor 09/21/2014   Stasis, venous 09/21/2014   Avitaminosis D 09/21/2014  Arthritis of knee, degenerative 11/16/2013   Bradycardia, sinus 12/31/2011   Left bundle branch block 12/31/2011   First  degree AV block 12/31/2011   Edema of both legs 12/31/2011   Bradycardia 12/31/2011   Localized edema 12/31/2011   COPD UNSPECIFIED 11/09/2009   HYPERTENSION, BENIGN 10/03/2009   SLEEP APNEA 10/03/2009   DYSPNEA 10/03/2009   Other forms of dyspnea 10/03/2009   CAD, NATIVE VESSEL 05/04/2009   Carotid disease, bilateral ([place]) 05/04/2009   Disorder of artery or arteriole ([place]) 05/04/2009    Immunization History  Administered Date(s) Administered   Fluad Quad(high Dose 65+) 02/24/2019, 02/14/2020, 03/28/2021   Influenza Split 02/20/2011, 02/27/2012   Influenza, High Dose Seasonal PF 02/22/2014, 02/18/2015, 02/29/2016, 02/26/2017, 04/22/2018   Influenza,inj,Quad PF,6+ Mos 02/13/2013   Influenza-Unspecified 02/24/2013   PFIZER(Purple Top)SARS-COV-2 Vaccination 06/17/2019, 07/08/2019, 04/26/2020   Pneumococcal Conjugate-13 04/14/2014   Pneumococcal Polysaccharide-23 03/25/1997, 04/05/2003   Td 08/29/2003   Tdap 02/20/2011, 02/14/2020   Zoster Recombinat (Shingrix) 02/26/2017, 06/25/2017   Zoster, Live 05/12/2007    Conditions to be addressed/monitored:  Hypertension, Hyperlipidemia, Diabetes, Atrial Fibrillation, Coronary Artery Disease, COPD, Hypothyroidism, and BPH  Care Plan : General Pharmacy (Adult)  Updates made by Germaine Pomfret, RPH since 09/19/2021 12:00 AM     Problem: Hypertension, Hyperlipidemia, Diabetes, Atrial Fibrillation, Coronary Artery Disease, COPD, Hypothyroidism, and BPH   Priority: High     Long-Range Goal: Patient-Specific Goal   Start Date: 12/11/2020  Expected End Date: 12/11/2021  This Visit's Progress: On track  Recent Progress: On track  Priority: High  Note:   Current Barriers:  No barriers noted   Pharmacist Clinical Goal(s):  Patient will maintain control of blood pressure as evidenced by BP less than 140/90  through collaboration with PharmD and provider.   Interventions: 1:1 collaboration with Jerrol Banana., MD regarding  development and update of comprehensive plan of care as evidenced by provider attestation and co-signature Inter-disciplinary care team collaboration (see longitudinal plan of care) Comprehensive medication review performed; medication list updated in electronic medical record  Hypertension (BP goal <140/90) -Uncontrolled -Current treatment: Doxazosin 8 mg nightly: Appropriate, Effective, Safe, Accessible Furosemide 40 mg daily: Appropriate, Effective, Query safe Ramipril 5 mg daily: Appropriate, Effective, Safe, Accessible  -Medications previously tried: NA  -Current home readings: 16/10, 960A systolics  -Denies hypotension symptoms. -Current dietary habits: low sodium diet -Current exercise habits: NA -Given recent borderline readings, will have patient continue to monitor blood pressure and hypotension symptoms, will adjust medications as needed. -Continue current medications  Atrial Fibrillation (Goal: prevent stroke and major bleeding) -Controlled -CHADSVASC: 5 -Current treatment: Rate control: None Anticoagulation: Eliquis 5 mg twice daily: Appropriate, Effective, Safe, Accessible  -Medications previously tried: NA -Counseled on avoidance of NSAIDs due to increased bleeding risk with anticoagulants; -Recommended to continue current medication  Hyperlipidemia: (LDL goal < 70) -Controlled -Current treatment: Rosuvastatin 20 mg 1/2 tablet nightly  -Medications previously tried: NA  -Educated on Importance of limiting foods high in cholesterol; -Recommended to continue current medication  Hypothyroidism (Goal: Maintain stable thyroid) -Controlled -Current treatment  Levothyroxine 75 mcg daily  -Medications previously tried: NA  -Recommended to continue current medication  Chronic Kidney Disease Stage 3a  -All medications assessed for renal dosing and appropriateness in chronic kidney disease. -Will need to reduce Eliquis if creatinine > 1.5.  -Recommended to continue  current medication  Patient Goals/Self-Care Activities Patient will:  - check blood pressure weekly, document, and provide at future appointments  Follow Up Plan: Telephone  follow up appointment with care management team member scheduled for:  11/05/2021 at 3:45 PM        Medication Assistance: None required.  Patient affirms current coverage meets needs.  Compliance/Adherence/Medication fill history: Care Gaps: Foot exam, ophthalmology exam  Covid Booster   Star-Rating Drugs: Quinapril 20 mg last filled on 11/24/2020 for a 90-Day supply at [facility] Rosuvastatin 20 mg last filled on 11/24/2020 for a 90-Day supply at [facility]  Patient's preferred pharmacy is:  [facility] [address], [place] - [place] [address] Phone: [phone] Fax: [phone]  [facility] ([place], [place] [address] Phone: [phone] Fax: [phone]   Uses pill box? Yes Pt endorses 100% compliance  We discussed: Current pharmacy is preferred with insurance plan and patient is satisfied with pharmacy services Patient decided to: Continue current medication management strategy  Care Plan and Follow Up Patient Decision:  Patient agrees to Care Plan and Follow-up.  Plan: Telephone follow up appointment with care management team member scheduled for:  11/05/2021 at 3:45 PM

## 2021-09-19 NOTE — Patient Instructions (Signed)
Visit Information ?It was great speaking with you today!  Please let me know if you have any questions about our visit. ? ? Goals Addressed   ? ?  ?  ?  ?  ? This Visit's Progress  ?  Track and Manage My Blood Pressure-Hypertension   On track  ?  Timeframe:  Long-Range Goal ?Priority:  High ?Start Date:  12/11/20                            ?Expected End Date: 12/11/21                      ? ?Follow Up within 90 days  ?  ?- check blood pressure weekly  ?  ?Why is this important?   ?You won't feel high blood pressure, but it can still hurt your blood vessels.  ?High blood pressure can cause heart or kidney problems. It can also cause a stroke.  ?Making lifestyle changes like losing a little weight or eating less salt will help.  ?Checking your blood pressure at home and at different times of the day can help to control blood pressure.  ?If the doctor prescribes medicine remember to take it the way the doctor ordered.  ?Call the office if you cannot afford the medicine or if there are questions about it.   ?  ?Notes:  ?  ? ?  ? ? ?Patient Care Plan: General Pharmacy (Adult)  ?  ? ?Problem Identified: Hypertension, Hyperlipidemia, Diabetes, Atrial Fibrillation, Coronary Artery Disease, COPD, Hypothyroidism, and BPH   ?Priority: High  ?  ? ?Long-Range Goal: Patient-Specific Goal   ?Start Date: 12/11/2020  ?Expected End Date: 12/11/2021  ?This Visit's Progress: On track  ?Recent Progress: On track  ?Priority: High  ?Note:   ?Current Barriers:  ?No barriers noted  ? ?Pharmacist Clinical Goal(s):  ?Patient will maintain control of blood pressure as evidenced by BP less than 140/90  through collaboration with PharmD and provider.  ? ?Interventions: ?1:1 collaboration with Jerrol Banana., MD regarding development and update of comprehensive plan of care as evidenced by provider attestation and co-signature ?Inter-disciplinary care team collaboration (see longitudinal plan of care) ?Comprehensive medication review  performed; medication list updated in electronic medical record ? ?Hypertension (BP goal <140/90) ?-Uncontrolled ?-Current treatment: ?Doxazosin 8 mg nightly: Appropriate, Effective, Safe, Accessible ?Furosemide 40 mg daily: Appropriate, Effective, Query safe ?Ramipril 5 mg daily: Appropriate, Effective, Safe, Accessible  ?-Medications previously tried: NA  ?-Current home readings: 33/29, 518A systolics  ?-Denies hypotension symptoms. ?-Current dietary habits: low sodium diet ?-Current exercise habits: NA ?-Given recent borderline readings, will have patient continue to monitor blood pressure and hypotension symptoms, will adjust medications as needed. ?-Continue current medications ? ?Atrial Fibrillation (Goal: prevent stroke and major bleeding) ?-Controlled ?-CHADSVASC: 5 ?-Current treatment: ?Rate control: None ?Anticoagulation: Eliquis 5 mg twice daily: Appropriate, Effective, Safe, Accessible  ?-Medications previously tried: NA ?-Counseled on avoidance of NSAIDs due to increased bleeding risk with anticoagulants; ?-Recommended to continue current medication ? ?Hyperlipidemia: (LDL goal < 70) ?-Controlled ?-Current treatment: ?Rosuvastatin 20 mg 1/2 tablet nightly  ?-Medications previously tried: NA  ?-Educated on Importance of limiting foods high in cholesterol; ?-Recommended to continue current medication ? ?Hypothyroidism (Goal: Maintain stable thyroid) ?-Controlled ?-Current treatment  ?Levothyroxine 75 mcg daily  ?-Medications previously tried: NA  ?-Recommended to continue current medication ? ?Chronic Kidney Disease Stage 3a  ?-All medications assessed for renal  dosing and appropriateness in chronic kidney disease. ?-Will need to reduce Eliquis if creatinine > 1.5.  ?-Recommended to continue current medication ? ?Patient Goals/Self-Care Activities ?Patient will:  ?- check blood pressure weekly, document, and provide at future appointments ? ?Follow Up Plan: Telephone follow up appointment with care  management team member scheduled for:  11/05/2021 at 3:45 PM ?  ? ?Patient agreed to services and verbal consent obtained.  ? ?Patient verbalizes understanding of instructions and care plan provided today and agrees to view in [place]. Active MyChart status confirmed with patient.   ? ?Junius Argyle, PharmD, BCACP, CPP  ?Clinical Pharmacist Practitioner  ?[place] ?[phone]  ?

## 2021-09-20 DIAGNOSIS — N390 Urinary tract infection, site not specified: Secondary | ICD-10-CM | POA: Diagnosis not present

## 2021-09-20 DIAGNOSIS — B9689 Other specified bacterial agents as the cause of diseases classified elsewhere: Secondary | ICD-10-CM | POA: Diagnosis not present

## 2021-09-23 DIAGNOSIS — E785 Hyperlipidemia, unspecified: Secondary | ICD-10-CM

## 2021-09-23 DIAGNOSIS — N1831 Chronic kidney disease, stage 3a: Secondary | ICD-10-CM | POA: Diagnosis not present

## 2021-09-23 DIAGNOSIS — E039 Hypothyroidism, unspecified: Secondary | ICD-10-CM | POA: Diagnosis not present

## 2021-09-23 DIAGNOSIS — I129 Hypertensive chronic kidney disease with stage 1 through stage 4 chronic kidney disease, or unspecified chronic kidney disease: Secondary | ICD-10-CM | POA: Diagnosis not present

## 2021-09-23 DIAGNOSIS — I4891 Unspecified atrial fibrillation: Secondary | ICD-10-CM | POA: Diagnosis not present

## 2021-09-25 DIAGNOSIS — Z87440 Personal history of urinary (tract) infections: Secondary | ICD-10-CM | POA: Diagnosis not present

## 2021-09-25 DIAGNOSIS — Z09 Encounter for follow-up examination after completed treatment for conditions other than malignant neoplasm: Secondary | ICD-10-CM | POA: Diagnosis not present

## 2021-09-26 ENCOUNTER — Other Ambulatory Visit: Payer: Self-pay | Admitting: Family Medicine

## 2021-09-26 ENCOUNTER — Telehealth: Payer: Self-pay

## 2021-09-26 DIAGNOSIS — E039 Hypothyroidism, unspecified: Secondary | ICD-10-CM

## 2021-09-26 NOTE — Progress Notes (Signed)
? ? ?Chronic Care Management ?Pharmacy Assistant  ? ?Name: Jared Rice  MRN: [id number] DOB: February 19, 1933 ? ?Reason for Encounter: Hypertension Disease State Call ?  ?Recent office visits:  ?None ID ? ?Recent consult visits:  ?None ID  ? ?Hospital visits:  ?None in previous 6 months ? ?Medications: ?Outpatient Encounter Medications as of 09/26/2021  ?Medication Sig  ? apixaban (ELIQUIS) 5 MG TABS tablet Take 1 tablet (5 mg total) by mouth 2 (two) times daily.  ? diphenhydramine-acetaminophen (TYLENOL PM) 25-500 MG TABS Take 2 tablets by mouth at bedtime.  ? doxazosin (CARDURA) 8 MG tablet Take 1 tablet (8 mg total) by mouth daily.  ? doxycycline (VIBRA-TABS) 100 MG tablet Take 1 tablet (100 mg total) by mouth 2 (two) times daily.  ? fluconazole (DIFLUCAN) 200 MG tablet Take 1 tablet (200 mg total) by mouth as directed. Take 1 PO on Mon, Wed and Fri for 3 doses  ? furosemide (LASIX) 40 MG tablet Take 1 tablet by mouth once daily (dose change)  ? Hydrocortisone-Iodoquinol (IODOQUINOL-HC EX) Apply 1 application topically daily as needed.  ? levothyroxine (SYNTHROID) 75 MCG tablet Take 1 tablet (75 mcg total) by mouth daily before breakfast. Take 1 tablet by mouth daily in the morning  ? ramipril (ALTACE) 5 MG capsule Take 1 capsule by mouth once daily  ? rosuvastatin (CRESTOR) 10 MG tablet Take 1 tablet by mouth once daily  ? topiramate (TOPAMAX) 50 MG tablet Take 1 tablet by mouth twice daily  ? traMADol (ULTRAM) 50 MG tablet Take 1-2 tablets (50-100 mg total) by mouth every 6 (six) hours as needed for moderate pain.  ? ?No facility-administered encounter medications on file as of 09/26/2021.  ? ?Care Gaps: ?Diabetic Foot Exam ?Diabetic Eye Exam ?COVID-19 Vaccine Booster 4 ?BP > 140/90 ? ?Star Rating Drugs: ?Ramipril 5 mg last filled on 09/11/2021 for a 30-Day supply with [facility] ?Rosuvastatin 10 mg last filled on 09/14/2021 for a 90-Day supply with [facility] ? ?Reviewed chart prior to disease  state call. Spoke with patient regarding BP ? ?Recent Office Vitals: ?BP Readings from Last 3 Encounters:  ?08/14/21 (!) 143/72  ?07/24/21 (!) 161/81  ?07/19/21 (!) 144/100  ? ?Pulse Readings from Last 3 Encounters:  ?08/14/21 62  ?07/24/21 78  ?07/19/21 81  ?  ?Wt Readings from Last 3 Encounters:  ?08/14/21 255 lb (115.7 kg)  ?07/24/21 258 lb (117 kg)  ?07/19/21 263 lb 11.2 oz (119.6 kg)  ?  ?Kidney Function ?Lab Results  ?Component Value Date/Time  ? CREATININE 1.30 (H) 08/15/2021 08:45 AM  ? CREATININE 0.97 07/19/2021 03:56 PM  ? CREATININE 0.87 10/13/2013 10:55 AM  ? GFRNONAA >60 08/24/2020 12:18 PM  ? GFRNONAA >60 10/13/2013 10:55 AM  ? GFRAA 74 07/05/2020 10:31 AM  ? GFRAA >60 10/13/2013 10:55 AM  ? ? ?  Latest Ref Rng & Units 08/15/2021  ?  8:45 AM 07/19/2021  ?  3:56 PM 02/07/2021  ? 10:37 AM  ?BMP  ?Glucose 70 - 99 mg/dL 90   82   89    ?BUN 8 - 27 mg/dL '24   20   20    '$ ?Creatinine 0.76 - 1.27 mg/dL 1.30   0.97   1.05    ?BUN/Creat Ratio 10 - '24 18   21   19    '$ ?Sodium 134 - 144 mmol/L 144   142   139    ?Potassium 3.5 - 5.2 mmol/L 4.0   4.4  4.3    ?Chloride 96 - 106 mmol/L 108   107   103    ?CO2 20 - 29 mmol/L '26   25   24    '$ ?Calcium 8.6 - 10.2 mg/dL 8.7   9.0   8.6    ? ?Current antihypertensive regimen:  ?Doxazosin 8 mg nightly ?Furosemide 40 mg daily ?Ramipril 5 mg daily ? ?How often are you checking your Blood Pressure?  Patient stated that although he has a monitor in the home he has not been checking his blood pressure. He did advise that a nurse stopped by a few days ago and took it but it was normal although he can not remember the exact number.  ? ?Current home BP readings: N/A ? ?What recent interventions/DTPs have been made by any provider to improve Blood Pressure control since last CPP Visit: None ID ? ?Any recent hospitalizations or ED visits since last visit with CPP? No ? ?What diet changes have been made to improve Blood Pressure Control?  ?Patient hasn't made any changes since speaking  with CPP last week ? ?Adherence Review: ?Is the patient currently on ACE/ARB medication? Yes ?Does the patient have >5 day gap between last estimated fill dates? No ? ?I spoke with the patient, and he reports that he is doing well. Patient stated that he has not been checking his blood pressure but he does deny having any hypertensive or hypotension symptoms. I encouraged the patient to start taking his blood pressure a few times a week if he could so he can provide a few numbers to CPP when he calls him next month. Patient  is agreeable to start checking his blood pressures prior to next appointment with CPP.  ? ?Patient has no other concerns or issues at this time.  ? ?Patient has a follow-up telephone appointment with Junius Argyle, CPP on 11/05/2021 @ 1500. ? ?Lynann Bologna, CPA/CMA ?Clinical Pharmacist Assistant ?Phone: [phone]  ? ? ? ? ? ?

## 2021-09-26 NOTE — Telephone Encounter (Signed)
Requested Prescriptions  ?Pending Prescriptions Disp Refills  ?? levothyroxine (SYNTHROID) 75 MCG tablet [Pharmacy Med Name: LEVOTHYROXINE 75 MCG TABLET] 90 tablet 0  ?  Sig: Take 1 tablet by mouth daily in the morning before breakfast  ?  ? Endocrinology:  Hypothyroid Agents Failed - 09/26/2021  9:49 AM  ?  ?  Failed - TSH in normal range and within 360 days  ?  TSH  ?Date Value Ref Range Status  ?07/19/2021 8.240 (H) 0.450 - 4.500 uIU/mL Final  ?   ?  ?  Passed - Valid encounter within last 12 months  ?  Recent Outpatient Visits   ?      ? 1 month ago Essential hypertension  ? [facility] Jerrol Banana., MD  ? 2 months ago Shortness of breath  ? [facility] Jerrol Banana., MD  ? 2 months ago Shortness of breath  ? [facility] [place], [place], [place]  ? 6 months ago Generalized abdominal pain  ? [facility] Jerrol Banana., MD  ? 7 months ago Generalized abdominal pain  ? [facility] Jerrol Banana., MD  ?  ?  ?Future Appointments   ?        ? In 3 months Jerrol Banana., MD [facility]  ? In 3 months Ralene Bathe, MD [place]  ?  ? ?  ?  ?  ? ?

## 2021-10-01 ENCOUNTER — Telehealth: Payer: Self-pay | Admitting: Family Medicine

## 2021-10-01 NOTE — Telephone Encounter (Signed)
Hope with [facility] is calling with a verbal that it is ok to change the manafactuer on the levothyroxine (SYNTHROID) 75 MCG tablet [[id number]] changing from [place] to lupin Please advise CB- [phone] ?

## 2021-10-02 ENCOUNTER — Telehealth: Payer: Self-pay

## 2021-10-02 NOTE — Telephone Encounter (Signed)
Okay to change? 

## 2021-10-02 NOTE — Telephone Encounter (Signed)
Copied from [place]. Topic: General - Other ?>> Oct 02, 2021  2:43 PM Tessa Lerner A wrote: ?Reason for CRM: Milssy with [facility] has called for confirmation from a member of staff that the patient's levothyroxine (SYNTHROID) 75 MCG tablet [[id number]] manufacturer can be changed to [place]  ? ?Ref # [id number]  ? ?Please contact further when possible ?

## 2021-10-03 NOTE — Telephone Encounter (Signed)
Left detailed message advising change was okay. ?

## 2021-10-08 DIAGNOSIS — I739 Peripheral vascular disease, unspecified: Secondary | ICD-10-CM | POA: Diagnosis not present

## 2021-10-08 DIAGNOSIS — B351 Tinea unguium: Secondary | ICD-10-CM | POA: Diagnosis not present

## 2021-10-08 DIAGNOSIS — L84 Corns and callosities: Secondary | ICD-10-CM | POA: Diagnosis not present

## 2021-10-08 DIAGNOSIS — E1142 Type 2 diabetes mellitus with diabetic polyneuropathy: Secondary | ICD-10-CM | POA: Diagnosis not present

## 2021-10-15 ENCOUNTER — Telehealth: Payer: Self-pay

## 2021-10-15 NOTE — Telephone Encounter (Signed)
Pt called requesting a refill of [facility] intertrigo cream, okay refills sent to [facility].

## 2021-10-25 NOTE — Progress Notes (Unsigned)
.     I,Sha'taria Tyson,acting as a Education administrator for [employer], PA-C.,have documented all relevant documentation on the behalf of Mikey Kirschner, PA-C,as directed by  Mikey Kirschner, PA-C while in the presence of Mikey Kirschner, PA-C.  Acute Office Visit  Subjective:     Patient ID: KHAMBREL AMSDEN, male    DOB: June 10, 1932, 88 y.o.   MRN: [id number]  No chief complaint on file.   HPI Patient is in today for diarrhea concerns. -Last Eye Exam? ROS      Objective:    There were no vitals taken for this visit. {Vitals History (Optional):23777}  Physical Exam  No results found for any visits on 10/26/21.      Assessment & Plan:   Problem List Items Addressed This Visit   None   No orders of the defined types were placed in this encounter.   No follow-ups on file.  Beverlee Nims, [place]

## 2021-10-26 ENCOUNTER — Encounter: Payer: Self-pay | Admitting: Physician Assistant

## 2021-10-26 ENCOUNTER — Ambulatory Visit (INDEPENDENT_AMBULATORY_CARE_PROVIDER_SITE_OTHER): Payer: PPO | Admitting: Physician Assistant

## 2021-10-26 VITALS — BP 155/88 | HR 70 | Ht 73.0 in | Wt 249.3 lb

## 2021-10-26 DIAGNOSIS — R197 Diarrhea, unspecified: Secondary | ICD-10-CM

## 2021-10-26 DIAGNOSIS — E039 Hypothyroidism, unspecified: Secondary | ICD-10-CM | POA: Insufficient documentation

## 2021-10-26 DIAGNOSIS — R7989 Other specified abnormal findings of blood chemistry: Secondary | ICD-10-CM | POA: Diagnosis not present

## 2021-10-26 DIAGNOSIS — E038 Other specified hypothyroidism: Secondary | ICD-10-CM

## 2021-10-26 NOTE — Assessment & Plan Note (Signed)
Last TSH elevated will recheck

## 2021-10-26 NOTE — Assessment & Plan Note (Signed)
Likely functional -- advised starting metamucil daily and as he becomes regular to take as needed If no improvement, labs abnormal, or changes in symptoms can investigate further

## 2021-10-27 LAB — CBC WITH DIFFERENTIAL/PLATELET
Basophils Absolute: 0 10*3/uL (ref 0.0–0.2)
Basos: 1 %
EOS (ABSOLUTE): 0.3 10*3/uL (ref 0.0–0.4)
Eos: 5 %
Hematocrit: 36.1 % — ABNORMAL LOW (ref 37.5–51.0)
Hemoglobin: 11.7 g/dL — ABNORMAL LOW (ref 13.0–17.7)
Immature Grans (Abs): 0 10*3/uL (ref 0.0–0.1)
Immature Granulocytes: 0 %
Lymphocytes Absolute: 1.5 10*3/uL (ref 0.7–3.1)
Lymphs: 23 %
MCH: 30.5 pg (ref 26.6–33.0)
MCHC: 32.4 g/dL (ref 31.5–35.7)
MCV: 94 fL (ref 79–97)
Monocytes Absolute: 0.6 10*3/uL (ref 0.1–0.9)
Monocytes: 10 %
Neutrophils Absolute: 4.1 10*3/uL (ref 1.4–7.0)
Neutrophils: 61 %
Platelets: 179 10*3/uL (ref 150–450)
RBC: 3.84 x10E6/uL — ABNORMAL LOW (ref 4.14–5.80)
RDW: 14.4 % (ref 11.6–15.4)
WBC: 6.5 10*3/uL (ref 3.4–10.8)

## 2021-10-27 LAB — TSH+FREE T4
Free T4: 1.2 ng/dL (ref 0.82–1.77)
TSH: 5.36 u[IU]/mL — ABNORMAL HIGH (ref 0.450–4.500)

## 2021-10-27 LAB — COMPREHENSIVE METABOLIC PANEL
ALT: 7 IU/L (ref 0–44)
AST: 8 IU/L (ref 0–40)
Albumin/Globulin Ratio: 2.2 (ref 1.2–2.2)
Albumin: 4.1 g/dL (ref 3.6–4.6)
Alkaline Phosphatase: 64 IU/L (ref 44–121)
BUN/Creatinine Ratio: 14 (ref 10–24)
BUN: 15 mg/dL (ref 8–27)
Bilirubin Total: 0.6 mg/dL (ref 0.0–1.2)
CO2: 26 mmol/L (ref 20–29)
Calcium: 8.7 mg/dL (ref 8.6–10.2)
Chloride: 101 mmol/L (ref 96–106)
Creatinine, Ser: 1.1 mg/dL (ref 0.76–1.27)
Globulin, Total: 1.9 g/dL (ref 1.5–4.5)
Glucose: 97 mg/dL (ref 70–99)
Potassium: 3.9 mmol/L (ref 3.5–5.2)
Sodium: 139 mmol/L (ref 134–144)
Total Protein: 6 g/dL (ref 6.0–8.5)
eGFR: 65 mL/min/{1.73_m2} (ref >59)

## 2021-11-02 ENCOUNTER — Telehealth: Payer: Self-pay

## 2021-11-02 NOTE — Progress Notes (Cosign Needed)
    Chronic Care Management Pharmacy Assistant   Name: Jared Rice  MRN: [id number] DOB: 07-Feb-1933  Patient called to be reminded of his telephone appointment with Junius Argyle, CPP on 11/05/2021 @ 1500  Patient aware of appointment date, time, and type of appointment (either telephone or in person). Patient aware to have/bring all medications, supplements, blood pressure and/or blood sugar logs to visit.   Star Rating Drug: Ramipril 5 mg last filled on 09/11/2021 for a 30-Day supply with [facility] Rosuvastatin 10 mg last filled on 09/14/2020 for a 90-Day supply with [facility]   Any gaps in medications fill history? yes   Care Gaps: Diabetic Foot Exam Diabetic Eye Exam COVID-19 Booster 4 BP > 140/90   Lynann Bologna, CPA/CMA Clinical Pharmacist Assistant Phone: [phone]

## 2021-11-05 ENCOUNTER — Ambulatory Visit (INDEPENDENT_AMBULATORY_CARE_PROVIDER_SITE_OTHER): Payer: PPO

## 2021-11-05 DIAGNOSIS — E038 Other specified hypothyroidism: Secondary | ICD-10-CM

## 2021-11-05 DIAGNOSIS — I10 Essential (primary) hypertension: Secondary | ICD-10-CM

## 2021-11-05 NOTE — Progress Notes (Signed)
Chronic Care Management Pharmacy Note  11/06/2021 Name:  Jared Rice MRN:  [id number] DOB:  [birth year]  Summary: Patient presents for CCM follow-up.  -Subclinical hypothyroid at last check, patient denies potential symptoms of hypothyroidism   Recommendations/Changes made from today's visit: -Medications previously tried: NA  -Recommended to continue current medication -Recommend rechecking TSH at next PCP follow-up.   Plan: CPP follow-up in 6 months   Subjective: Jared Rice is an 88 y.o. year old male who is a primary patient of Rosanna Randy, Retia Passe., MD.  The CCM team was consulted for assistance with disease management and care coordination needs.    Engaged with patient by telephone for follow up visit in response to provider referral for pharmacy case management and/or care coordination services.   Consent to Services:  The patient was given information about Chronic Care Management services, agreed to services, and gave verbal consent prior to initiation of services.  Please see initial visit note for detailed documentation.   Patient Care Team: Jerrol Banana., MD as PCP - General (Unknown Physician Specialty) [place]ique, Peter M, MD as PCP - Cardiology (Cardiology) Dingeldein, Remo Lipps, MD as Consulting Physician (Ophthalmology) Ralene Bathe, MD (Dermatology) Marry Guan Laurice Record, MD (Orthopedic Surgery) Caroline More, DPM as Consulting Physician (Podiatry) Germaine Pomfret, [facility] as Pharmacist (Pharmacist)  Recent office visits: 08/14/21: patient presented to Dr. Rosanna Randy for follow-up.   Recent consult visits: 10/08/21: Patient presented to Caroline More, DPM (Podiatry)   Hospital visits: None in past 6 months    Objective:  Lab Results  Component Value Date   CREATININE 1.10 10/26/2021   BUN 15 10/26/2021   GFRNONAA >60 08/24/2020   GFRAA 74 07/05/2020   NA 139 10/26/2021   K 3.9 10/26/2021   CALCIUM 8.7 10/26/2021   CO2 26 10/26/2021    GLUCOSE 97 10/26/2021    Lab Results  Component Value Date/Time   HGBA1C 5.8 (H) 08/15/2021 08:42 AM   HGBA1C 5.9 (H) 07/05/2020 10:31 AM    Last diabetic Eye exam:  Lab Results  Component Value Date/Time   HMDIABEYEEXA No Retinopathy 04/27/2019 12:00 AM    Last diabetic Foot exam: No results found for: "HMDIABFOOTEX"   Lab Results  Component Value Date   CHOL 107 08/15/2021   HDL 42 08/15/2021   LDLCALC 55 08/15/2021   TRIG 35 08/15/2021   CHOLHDL 2.5 08/15/2021       Latest Ref Rng & Units 10/26/2021   11:19 AM 08/15/2021    8:45 AM 07/19/2021    3:56 PM  Hepatic Function  Total Protein 6.0 - 8.5 g/dL 6.0  5.9  6.1   Albumin 3.6 - 4.6 g/dL 4.1  3.9  4.1   AST 0 - 40 IU/L 8  11  10    ALT 0 - 44 IU/L 7  7  6    Alk Phosphatase 44 - 121 IU/L 64  63  74   Total Bilirubin 0.0 - 1.2 mg/dL 0.6  0.3  0.7     Lab Results  Component Value Date/Time   TSH 5.360 (H) 10/26/2021 11:19 AM   TSH 8.240 (H) 07/19/2021 03:56 PM   FREET4 1.20 10/26/2021 11:19 AM       Latest Ref Rng & Units 10/26/2021   11:19 AM 07/19/2021    3:56 PM 02/07/2021   10:37 AM  CBC  WBC 3.4 - 10.8 x10E3/uL 6.5  6.8  4.4   Hemoglobin 13.0 - 17.7 g/dL 11.7  11.3  10.5   Hematocrit 37.5 - 51.0 % 36.1  34.6  31.5   Platelets 150 - 450 x10E3/uL 179  162  170     No results found for: "VD25OH"  Clinical ASCVD: No  The ASCVD Risk score (Arnett DK, et al., 2019) failed to calculate for the following reasons:   The 2019 ASCVD risk score is only valid for ages 26 to 49       06/05/2021    3:08 PM 05/31/2020    3:00 PM 03/20/2020   12:55 PM  Depression screen PHQ 2/9  Decreased Interest 0 0 0  Down, Depressed, Hopeless 0 0 0  PHQ - 2 Score 0 0 0    Social History   Tobacco Use  Smoking Status Former   Packs/day: 2.00   Years: 31.00   Total pack years: 62.00   Types: Cigarettes   Quit date: 12/24/1980   Years since quitting: 40.8  Smokeless Tobacco Never  Tobacco Comments   quit in 1982   BP  Readings from Last 3 Encounters:  10/26/21 (!) 155/88  08/14/21 (!) 143/72  07/24/21 (!) 161/81   Pulse Readings from Last 3 Encounters:  10/26/21 70  08/14/21 62  07/24/21 78   Wt Readings from Last 3 Encounters:  10/26/21 249 lb 4.8 oz (113.1 kg)  08/14/21 255 lb (115.7 kg)  07/24/21 258 lb (117 kg)   BMI Readings from Last 3 Encounters:  10/26/21 32.89 kg/m  08/14/21 32.74 kg/m  07/24/21 34.04 kg/m    Assessment/Interventions: Review of patient past medical history, allergies, medications, health status, including review of consultants reports, laboratory and other test data, was performed as part of comprehensive evaluation and provision of chronic care management services.   SDOH:  (Social Determinants of Health) assessments and interventions performed: Yes   SDOH Screenings   Alcohol Screen: Low Risk  (06/05/2021)   Alcohol Screen    Last Alcohol Screening Score (AUDIT): 0  Depression (PHQ2-9): Low Risk  (06/05/2021)   Depression (PHQ2-9)    PHQ-2 Score: 0  Financial Resource Strain: Low Risk  (06/05/2021)   Overall Financial Resource Strain (CARDIA)    Difficulty of Paying Living Expenses: Not hard at all  Food Insecurity: No Food Insecurity (06/05/2021)   Hunger Vital Sign    Worried About Running Out of Food in the Last Year: Never true    Ran Out of Food in the Last Year: Never true  Housing: Low Risk  (06/05/2021)   Housing    Last Housing Risk Score: 0  Physical Activity: Inactive (06/05/2021)   Exercise Vital Sign    Days of Exercise per Week: 0 days    Minutes of Exercise per Session: 0 min  Social Connections: Moderately Integrated (06/05/2021)   Social Connection and Isolation Panel [NHANES]    Frequency of Communication with Friends and Family: More than three times a week    Frequency of Social Gatherings with Friends and Family: More than three times a week    Attends Religious Services: Never    Marine scientist or Organizations: Yes     Attends Archivist Meetings: More than 4 times per year    Marital Status: Married  Stress: No Stress Concern Present (06/05/2021)   [employer]    Feeling of Stress : Not at all  Tobacco Use: Medium Risk (10/26/2021)   Patient History    Smoking Tobacco Use: Former    Smokeless  Tobacco Use: Never    Passive Exposure: Not on file  Transportation Needs: No Transportation Needs (06/05/2021)   PRAPARE - Hydrologist (Medical): No    Lack of Transportation (Non-Medical): No    CCM Care Plan  Allergies  Allergen Reactions   Dexilant [Dexlansoprazole] Rash   Primidone Itching and Rash    Medications Reviewed Today     Reviewed by Emelia Loron (Physician Assistant Certified) on 27/03/50 at 1136  Med List Status: <None>   Medication Order Taking? Sig Documenting Provider Last Dose Status Informant  apixaban (ELIQUIS) 5 MG TABS tablet [id number] No Take 1 tablet (5 mg total) by mouth 2 (two) times daily.  Patient not taking: Reported on 10/26/2021   Martinique, Peter M, MD Not Taking Active   diphenhydramine-acetaminophen (TYLENOL PM) 25-500 MG TABS [id number] Yes Take 2 tablets by mouth at bedtime. [provider] Taking Active Self  doxazosin (CARDURA) 8 MG tablet [id number] Yes Take 1 tablet (8 mg total) by mouth daily. Jerrol Banana., MD Taking Active   doxycycline (VIBRA-TABS) 100 MG tablet [id number] Yes Take 1 tablet (100 mg total) by mouth 2 (two) times daily. Mardene Speak, PA-C Taking Active   fluconazole (DIFLUCAN) 200 MG tablet [id number] Yes Take 1 tablet (200 mg total) by mouth as directed. Take 1 PO on Mon, Wed and Fri for 3 doses Ralene Bathe, MD Taking Active   furosemide (LASIX) 40 MG tablet [id number] Yes Take 1 tablet by mouth once daily (dose change) Jerrol Banana., MD Taking Active   Hydrocortisone-Iodoquinol [facility]) [id number] Yes  Apply 1 application topically daily as needed. [provider] Taking Active   levothyroxine (SYNTHROID) 75 MCG tablet [id number] Yes Take 1 tablet by mouth daily in the morning before breakfast Jerrol Banana., MD Taking Active   ramipril (ALTACE) 5 MG capsule [id number] Yes Take 1 capsule by mouth once daily Jerrol Banana., MD Taking Active   rosuvastatin (CRESTOR) 10 MG tablet [id number] Yes Take 1 tablet by mouth once daily Jerrol Banana., MD Taking Active   topiramate (TOPAMAX) 50 MG tablet [id number] Yes Take 1 tablet by mouth twice daily Fisher, Kirstie Peri, MD Taking Active   traMADol (ULTRAM) 50 MG tablet [id number] Yes Take 1-2 tablets (50-100 mg total) by mouth every 6 (six) hours as needed for moderate pain. Lattie Corns, PA-C Taking Active             Patient Active Problem List   Diagnosis Date Noted   Diarrhea 10/26/2021   Status post total right knee replacement 10/19/2020   Total knee replacement status 09/06/2020   Atrial fibrillation ([place]) 05/12/2020   Urinary frequency 11/22/2019   Leukocytes in urine 11/22/2019   Skin tear of forearm without complication, initial encounter- left 11/22/2019   Wound cellulitis- right lower leg anterior  10/29/2019   Leg wound, right, sequela 10/29/2019   Chronic fatigue 11/29/2015   Benign prostatic hyperplasia 02/22/2015   Gastrointestinal ulcer    History of peptic ulcer    Hematochezia    Acute gastric ulcer    Melena 11/12/2014   Malignant neoplasm of skin 09/21/2014   Cataract 09/21/2014   Colon polyp 09/21/2014   CAFL (chronic airflow limitation) ([place]) 09/21/2014   Diabetes ([place]) 09/21/2014   DD (diverticular disease) 09/21/2014   Benign essential tremor 09/21/2014   Personal history of tobacco use, presenting hazards to health 09/21/2014   Acid reflux 09/21/2014  Hemorrhoids 09/21/2014   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   HLD (hyperlipidemia) 09/21/2014   BP (high blood pressure)  09/21/2014   Hypothyroidism 09/21/2014   Lymphoma ([place]) 09/21/2014   Malaise and fatigue 09/21/2014   Mononeuritis 09/21/2014   Muscle ache 09/21/2014   Neuropathy 09/21/2014   Numbness and tingling 09/21/2014   Arthritis, degenerative 09/21/2014   Awareness of heartbeats 09/21/2014   Borderline diabetes 09/21/2014   Rotator cuff syndrome 09/21/2014   Has a tremor 09/21/2014   Stasis, venous 09/21/2014   Avitaminosis D 09/21/2014   Arthritis of knee, degenerative 11/16/2013   Bradycardia, sinus 12/31/2011   Left bundle branch block 12/31/2011   First degree AV block 12/31/2011   Edema of both legs 12/31/2011   Bradycardia 12/31/2011   Localized edema 12/31/2011   COPD UNSPECIFIED 11/09/2009   HYPERTENSION, BENIGN 10/03/2009   SLEEP APNEA 10/03/2009   DYSPNEA 10/03/2009   Other forms of dyspnea 10/03/2009   CAD, NATIVE VESSEL 05/04/2009   Carotid disease, bilateral ([place]) 05/04/2009   Disorder of artery or arteriole ([place]) 05/04/2009    Immunization History  Administered Date(s) Administered   Fluad Quad(high Dose 65+) 02/24/2019, 02/14/2020, 03/28/2021   Influenza Split 02/20/2011, 02/27/2012   Influenza, High Dose Seasonal PF 02/22/2014, 02/18/2015, 02/29/2016, 02/26/2017, 04/22/2018   Influenza,inj,Quad PF,6+ Mos 02/13/2013   Influenza-Unspecified 02/24/2013   PFIZER(Purple Top)SARS-COV-2 Vaccination 06/17/2019, 07/08/2019, 04/26/2020   Pneumococcal Conjugate-13 04/14/2014   Pneumococcal Polysaccharide-23 03/25/1997, 04/05/2003   Td 08/29/2003   Tdap 02/20/2011, 02/14/2020   Zoster Recombinat (Shingrix) 02/26/2017, 06/25/2017   Zoster, Live 05/12/2007    Conditions to be addressed/monitored:  Hypertension, Hyperlipidemia, Diabetes, Atrial Fibrillation, Coronary Artery Disease, COPD, Hypothyroidism, and BPH  Care Plan : General Pharmacy (Adult)  Updates made by Germaine Pomfret, RPH since 11/06/2021 12:00 AM     Problem: Hypertension, Hyperlipidemia, Diabetes,  Atrial Fibrillation, Coronary Artery Disease, COPD, Hypothyroidism, and BPH   Priority: High     Long-Range Goal: Patient-Specific Goal   Start Date: 12/11/2020  Expected End Date: 12/11/2021  This Visit's Progress: On track  Recent Progress: On track  Priority: High  Note:   Current Barriers:  No barriers noted   Pharmacist Clinical Goal(s):  Patient will maintain control of blood pressure as evidenced by BP less than 140/90  through collaboration with PharmD and provider.   Interventions: 1:1 collaboration with Jerrol Banana., MD regarding development and update of comprehensive plan of care as evidenced by provider attestation and co-signature Inter-disciplinary care team collaboration (see longitudinal plan of care) Comprehensive medication review performed; medication list updated in electronic medical record  Hypertension (BP goal <140/90) -Controlled -Current treatment: Doxazosin 8 mg nightly: Appropriate, Effective, Safe, Accessible Furosemide 40 mg daily: Appropriate, Effective, Query safe Ramipril 5 mg daily: Appropriate, Effective, Safe, Accessible  -Medications previously tried: NA  -Current home readings: 23/95, 320E systolics  -Denies hypotension symptoms. -Current dietary habits: low sodium diet -Current exercise habits: NA -Continue current medications  Atrial Fibrillation (Goal: prevent stroke and major bleeding) -Controlled -CHADSVASC: 5 -Current treatment: Rate control: None Anticoagulation: Eliquis 5 mg twice daily: Appropriate, Effective, Safe, Accessible  -Medications previously tried: NA -Counseled on avoidance of NSAIDs due to increased bleeding risk with anticoagulants; -Recommended to continue current medication  Hyperlipidemia: (LDL goal < 70) -Controlled -Current treatment: Rosuvastatin 20 mg 1/2 tablet nightly  -Medications previously tried: NA  -Educated on Importance of limiting foods high in cholesterol; -Recommended to continue  current medication  Hypothyroidism (Goal: Maintain stable thyroid) -Controlled -Subclinical hypothyroid,  patient denies potential symptoms of hypothyroidism  -Current treatment  Levothyroxine 75 mcg daily  -Medications previously tried: NA  -Recommended to continue current medication -Recommend rechecking TSH at next PCP follow-up.   Chronic Kidney Disease Stage 3a  -All medications assessed for renal dosing and appropriateness in chronic kidney disease. -Will need to reduce Eliquis if creatinine > 1.5.  -Recommended to continue current medication  Patient Goals/Self-Care Activities Patient will:  - check blood pressure weekly, document, and provide at future appointments  Follow Up Plan: Telephone follow up appointment with care management team member scheduled for:  05/06/2022 at 3:00 PM    Medication Assistance: None required.  Patient affirms current coverage meets needs.  Compliance/Adherence/Medication fill history: Care Gaps: Foot exam, ophthalmology exam  Covid Booster   Star-Rating Drugs: Quinapril 20 mg last filled on 11/24/2020 for a 90-Day supply at [facility] Rosuvastatin 20 mg last filled on 11/24/2020 for a 90-Day supply at [facility]  Patient's preferred pharmacy is:  [facility] [address] [name] [address] Phone: [phone] Fax: [phone]  [facility] ([place], [place] [address] Phone: [phone] Fax: [phone]  Uses pill box? Yes Pt endorses 100% compliance  We discussed: Current pharmacy is preferred with insurance plan and patient is satisfied with pharmacy services Patient decided to: Continue current medication management strategy  Care Plan and Follow Up Patient Decision:  Patient agrees to Care Plan and Follow-up.  Plan: Telephone follow up appointment with care  management team member scheduled for:  05/06/2022 at 3:00 PM

## 2021-11-06 NOTE — Patient Instructions (Signed)
Visit Information It was great speaking with you today!  Please let me know if you have any questions about our visit.   Goals Addressed             This Visit's Progress    Track and Manage My Blood Pressure-Hypertension   On track    Timeframe:  Long-Range Goal Priority:  High Start Date:  12/11/20                            Expected End Date: 12/11/21                       Follow Up within 90 days    - check blood pressure weekly    Why is this important?   You won't feel high blood pressure, but it can still hurt your blood vessels.  High blood pressure can cause heart or kidney problems. It can also cause a stroke.  Making lifestyle changes like losing a little weight or eating less salt will help.  Checking your blood pressure at home and at different times of the day can help to control blood pressure.  If the doctor prescribes medicine remember to take it the way the doctor ordered.  Call the office if you cannot afford the medicine or if there are questions about it.     Notes:         Patient Care Plan: General Pharmacy (Adult)     Problem Identified: Hypertension, Hyperlipidemia, Diabetes, Atrial Fibrillation, Coronary Artery Disease, COPD, Hypothyroidism, and BPH   Priority: High     Long-Range Goal: Patient-Specific Goal   Start Date: 12/11/2020  Expected End Date: 12/11/2021  This Visit's Progress: On track  Recent Progress: On track  Priority: High  Note:   Current Barriers:  No barriers noted   Pharmacist Clinical Goal(s):  Patient will maintain control of blood pressure as evidenced by BP less than 140/90  through collaboration with PharmD and provider.   Interventions: 1:1 collaboration with Jerrol Banana., MD regarding development and update of comprehensive plan of care as evidenced by provider attestation and co-signature Inter-disciplinary care team collaboration (see longitudinal plan of care) Comprehensive medication review  performed; medication list updated in electronic medical record  Hypertension (BP goal <140/90) -Controlled -Current treatment: Doxazosin 8 mg nightly: Appropriate, Effective, Safe, Accessible Furosemide 40 mg daily: Appropriate, Effective, Query safe Ramipril 5 mg daily: Appropriate, Effective, Safe, Accessible  -Medications previously tried: NA  -Current home readings: 52/84, 132G systolics  -Denies hypotension symptoms. -Current dietary habits: low sodium diet -Current exercise habits: NA -Continue current medications  Atrial Fibrillation (Goal: prevent stroke and major bleeding) -Controlled -CHADSVASC: 5 -Current treatment: Rate control: None Anticoagulation: Eliquis 5 mg twice daily: Appropriate, Effective, Safe, Accessible  -Medications previously tried: NA -Counseled on avoidance of NSAIDs due to increased bleeding risk with anticoagulants; -Recommended to continue current medication  Hyperlipidemia: (LDL goal < 70) -Controlled -Current treatment: Rosuvastatin 20 mg 1/2 tablet nightly  -Medications previously tried: NA  -Educated on Importance of limiting foods high in cholesterol; -Recommended to continue current medication  Hypothyroidism (Goal: Maintain stable thyroid) -Controlled -Subclinical hypothyroid, patient denies potential symptoms of hypothyroidism  -Current treatment  Levothyroxine 75 mcg daily  -Medications previously tried: NA  -Recommended to continue current medication -Recommend rechecking TSH at next PCP follow-up.   Chronic Kidney Disease Stage 3a  -All medications assessed for renal dosing and appropriateness  in chronic kidney disease. -Will need to reduce Eliquis if creatinine > 1.5.  -Recommended to continue current medication  Patient Goals/Self-Care Activities Patient will:  - check blood pressure weekly, document, and provide at future appointments  Follow Up Plan: Telephone follow up appointment with care management team member  scheduled for:  05/06/2022 at 3:00 PM      Patient agreed to services and verbal consent obtained.   Patient verbalizes understanding of instructions and care plan provided today and agrees to view in [place]. Active MyChart status and patient understanding of how to access instructions and care plan via MyChart confirmed with patient.     Junius Argyle, PharmD, Para [place], CPP  Clinical Pharmacist Practitioner  [facility] [phone]

## 2021-11-15 ENCOUNTER — Other Ambulatory Visit: Payer: Self-pay | Admitting: Family Medicine

## 2021-11-23 DIAGNOSIS — E038 Other specified hypothyroidism: Secondary | ICD-10-CM | POA: Diagnosis not present

## 2021-11-23 DIAGNOSIS — E039 Hypothyroidism, unspecified: Secondary | ICD-10-CM

## 2021-11-23 DIAGNOSIS — I10 Essential (primary) hypertension: Secondary | ICD-10-CM | POA: Diagnosis not present

## 2021-11-23 DIAGNOSIS — E785 Hyperlipidemia, unspecified: Secondary | ICD-10-CM

## 2021-12-14 DIAGNOSIS — Z87828 Personal history of other (healed) physical injury and trauma: Secondary | ICD-10-CM | POA: Diagnosis not present

## 2021-12-14 DIAGNOSIS — R238 Other skin changes: Secondary | ICD-10-CM | POA: Diagnosis not present

## 2021-12-17 ENCOUNTER — Other Ambulatory Visit: Payer: Self-pay | Admitting: Family Medicine

## 2021-12-17 DIAGNOSIS — I10 Essential (primary) hypertension: Secondary | ICD-10-CM

## 2021-12-17 IMAGING — DX DG KNEE 1-2V PORT*R*
2 series · 2 of 2 positions shown · non-contrast
Comparison: 02/14/2020

CLINICAL DATA: Postop right knee replacement.

EXAM:
PORTABLE RIGHT KNEE - 1-2 VIEW

[knee ap]
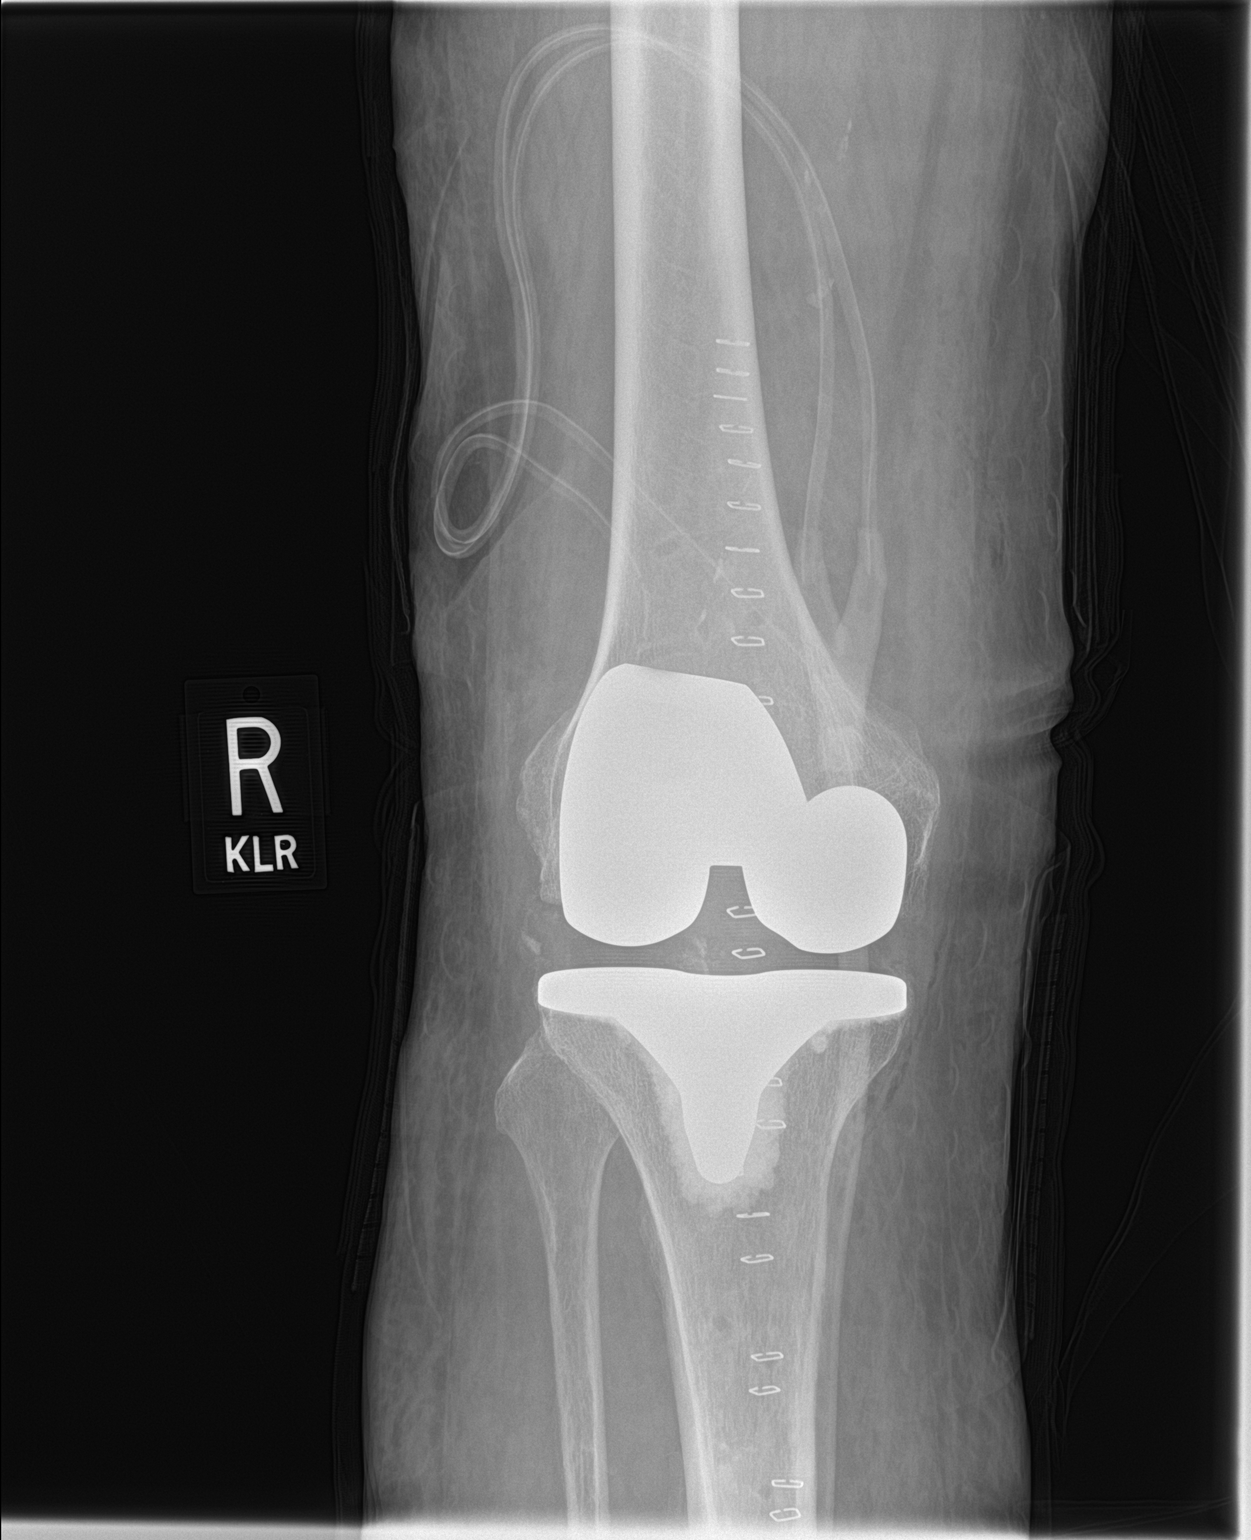

[knee lat]
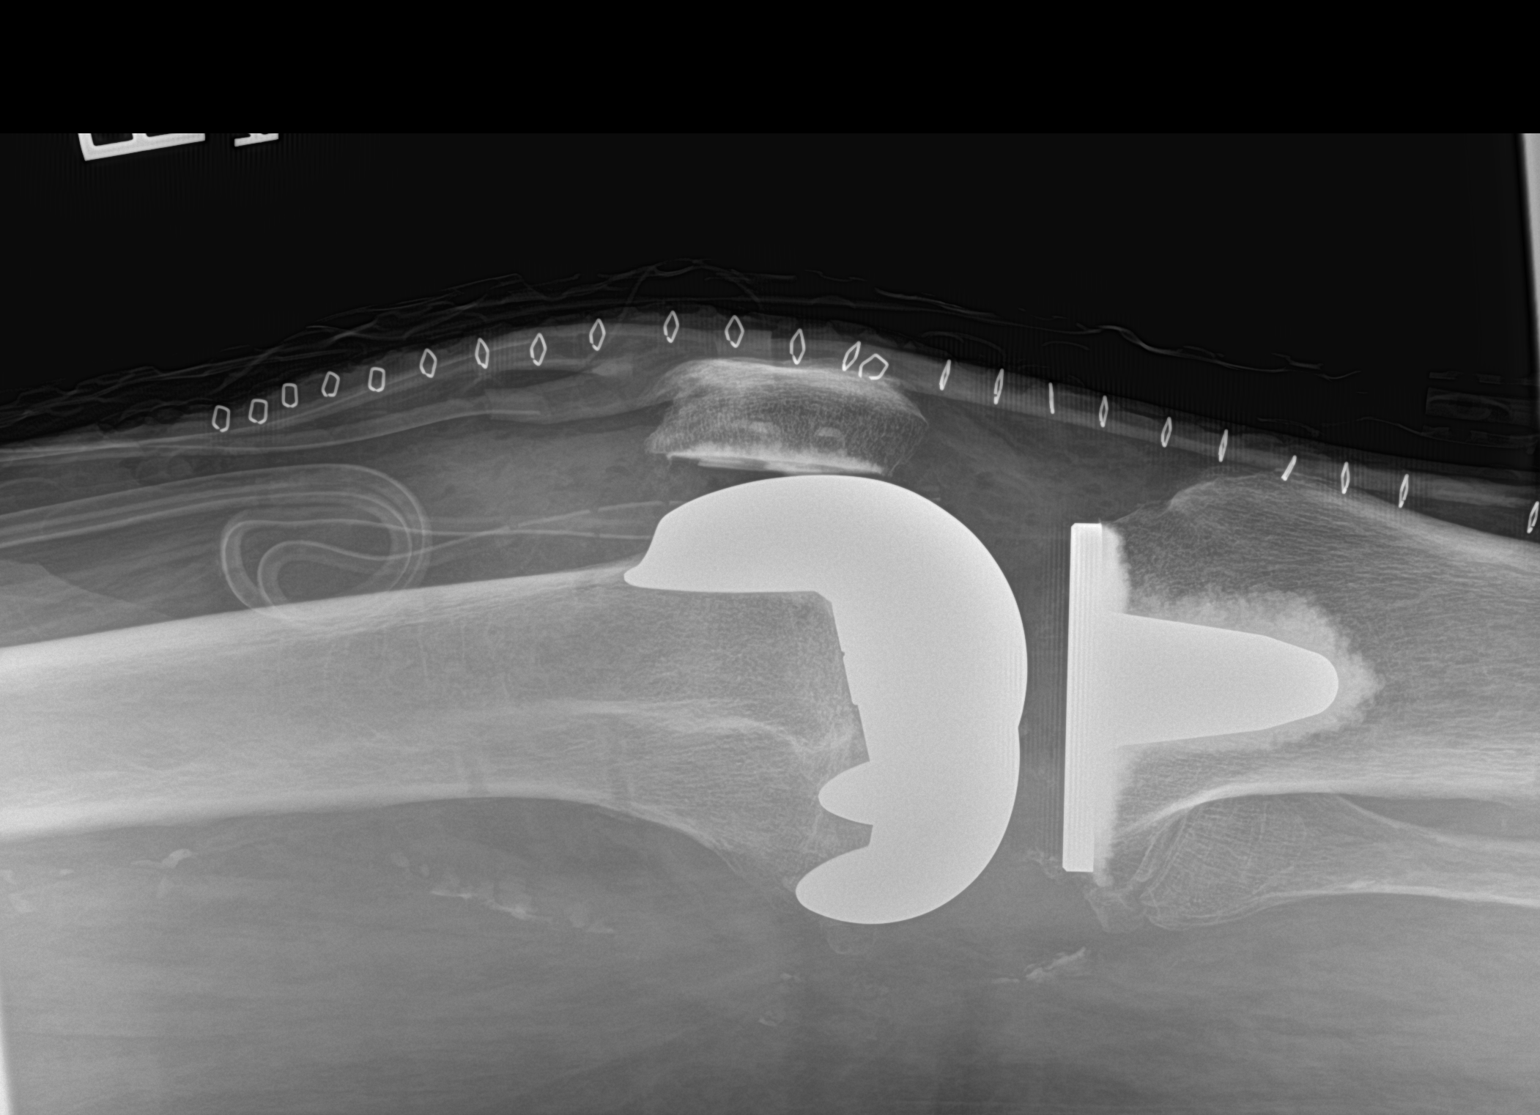

[2 of 2 positions shown; findings below may reference images not displayed]

FINDINGS: The prosthetic components appear well seated and well aligned.

No acute fracture or evidence of an operative complication.
IMPRESSION: Well-positioned total right knee arthroplasty.

## 2021-12-20 DIAGNOSIS — M7542 Impingement syndrome of left shoulder: Secondary | ICD-10-CM | POA: Diagnosis not present

## 2021-12-20 DIAGNOSIS — M7522 Bicipital tendinitis, left shoulder: Secondary | ICD-10-CM | POA: Diagnosis not present

## 2021-12-20 DIAGNOSIS — M7582 Other shoulder lesions, left shoulder: Secondary | ICD-10-CM | POA: Diagnosis not present

## 2021-12-26 DIAGNOSIS — R234 Changes in skin texture: Secondary | ICD-10-CM | POA: Diagnosis not present

## 2021-12-26 DIAGNOSIS — Z7901 Long term (current) use of anticoagulants: Secondary | ICD-10-CM | POA: Diagnosis not present

## 2021-12-31 NOTE — Progress Notes (Deleted)
Complete physical exam   Patient: Jared Rice   DOB: 1933-03-17   88 y.o. Male  MRN: [id number] Visit Date: 01/02/2022  Today's healthcare provider: Wilhemena Durie, MD   No chief complaint on file.  Subjective    Jared Rice is a 88 y.o. male who presents today for a complete physical exam.  He reports consuming a {diet types:17450} diet. {Exercise:19826} He generally feels {well/fairly well/poorly:18703}. He reports sleeping {well/fairly well/poorly:18703}. He {does/does not:200015} have additional problems to discuss today.  HPI  Patient had AWV with NHA on 06/05/2021.  Past Medical History:  Diagnosis Date   Actinic keratosis    Anginal pain (HCC)    Aortic atherosclerosis (HCC)    Arthritis    Atrial fibrillation ([place])    Basal cell carcinoma 02/06/2010   Left shoulder. Superficial.   Basal cell carcinoma 10/11/2013   Right medial forearm. Superficial   Basal cell carcinoma 04/10/2015   Right inf. lat. thigh. Superficial   Basal cell carcinoma 12/09/2016   Right cheek. Superficial.   Chronic airway obstruction, not elsewhere classified    Colon polyps    Complication of anesthesia    SPINAL DID NOT WORK FOR LAST KNEE REPLACEMENT AND HAD TO HAVE GA   Coronary atherosclerosis of native coronary artery    nonobstructive   Diabetes mellitus without complication (HCC)    Essential hypertension, benign    GERD (gastroesophageal reflux disease)    H/O   Glucose intolerance (impaired glucose tolerance)    Heart murmur    History of kidney stones    Hyperlipidemia    Hypothyroidism    Iron deficiency anemia    Knee pain, right    LBBB (left bundle branch block)    Occlusion and stenosis of carotid artery without mention of cerebral infarction    wears compression stockings   Other dyspnea and respiratory abnormality    w/ pseuodowheeze resolves with purse lip manuever   Precordial pain    Shoulder pain, right    Squamous cell carcinoma of skin  10/06/2007   Right forearm. SCCis   Squamous cell carcinoma of skin 10/11/2013   Left mid lat. pretibial. KA-like pattern.   Tremor    Unspecified sleep apnea    HAS NOT USED CPAP IN 20 YEARS   Venous insufficiency    Wears dentures    full upper   Past Surgical History:  Procedure Laterality Date   CARDIAC CATHETERIZATION     X2   COLONOSCOPY  03/17/12   Dr Byrnett-diverticulosis   ESOPHAGOGASTRODUODENOSCOPY (EGD) WITH PROPOFOL N/A 11/25/2014   Procedure: ESOPHAGOGASTRODUODENOSCOPY (EGD) WITH PROPOFOL;  Surgeon: Lucilla Lame, MD;  Location: [facility];  Service: Endoscopy;  Laterality: N/A;  with biopsy   ESOPHAGOGASTRODUODENOSCOPY (EGD) WITH PROPOFOL N/A 01/05/2015   Procedure: ESOPHAGOGASTRODUODENOSCOPY (EGD) WITH PROPOFOL;  Surgeon: Lucilla Lame, MD;  Location: [facility];  Service: Endoscopy;  Laterality: N/A;   EYE SURGERY Bilateral    cataract    KNEE ARTHROPLASTY Right 09/06/2020   Procedure: COMPUTER ASSISTED TOTAL KNEE ARTHROPLASTY;  Surgeon: Dereck Leep, MD;  Location: [facility];  Service: Orthopedics;  Laterality: Right;   TOTAL KNEE ARTHROPLASTY Left 2012   Social History   Socioeconomic History   Marital status: Married    Spouse name: Not on file   Number of children: 2   Years of education: Not on file   Highest education level: Bachelor's degree (e.g., BA, AB, BS)  Occupational History  Occupation: retired    Comment: Jared Rice  Tobacco Use   Smoking status: Former    Packs/day: 2.00    Years: 31.00    Total pack years: 62.00    Types: Cigarettes    Quit date: 12/24/1980    Years since quitting: 41.0   Smokeless tobacco: Never   Tobacco comments:    quit in 1982  Vaping Use   Vaping Use: Never used  Substance and Sexual Activity   Alcohol use: No    Alcohol/week: 0.0 standard drinks of alcohol   Drug use: No   Sexual activity: Not on file  Other Topics Concern   Not on file  Social History Narrative    Lives with wife, retired Engineer, drilling, 2 children, 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   Social Determinants of Health   Financial Resource Strain: [place]  (06/05/2021)   Overall Financial Resource Strain (CARDIA)    Difficulty of Paying Living Expenses: Not hard at all  Food Insecurity: No Food Insecurity (06/05/2021)   Hunger Vital Sign    Worried About Running Out of Food in the Last Year: Never true    [place] in the Last Year: Never true  Transportation Needs: No Transportation Needs (06/05/2021)   PRAPARE - Hydrologist (Medical): No    Lack of Transportation (Non-Medical): No  Physical Activity: Inactive (06/05/2021)   Exercise Vital Sign    Days of Exercise per Week: 0 days    Minutes of Exercise per Session: 0 min  Stress: No Stress Concern Present (06/05/2021)   [place]    Feeling of Stress : Not at all  Social Connections: Moderately Integrated (06/05/2021)   Social Connection and Isolation Panel [NHANES]    Frequency of Communication with Friends and Family: More than three times a week    Frequency of Social Gatherings with Friends and Family: More than three times a week    Attends Religious Services: Never    Marine scientist or Organizations: Yes    Attends Music therapist: More than 4 times per year    Marital Status: Married  Human resources officer Violence: Not At Risk (06/05/2021)   Humiliation, Afraid, Rape, and Kick questionnaire    Fear of Current or Ex-Partner: No    Emotionally Abused: No    Physically Abused: No    Sexually Abused: No   Family Status  Relation Name Status   Mother  Deceased at age 74   Father  Deceased at age 22   Brother  Deceased   Sister  Deceased   Sister  Alive   Sister  Deceased   MGM  Deceased   MGF  Deceased   PGM  Deceased   PGF  Deceased   Neg Hx  (Not Specified)   Family History  Problem Relation Age of Onset    Heart failure Mother 62       congestive   Heart attack Father 60   Alcohol abuse Father    Alzheimer's disease Brother 90   Alzheimer's disease Sister 79   Colon cancer Neg Hx    Liver disease Neg Hx    Allergies  Allergen Reactions   Dexilant [Dexlansoprazole] Rash   Primidone Itching and Rash    Patient Care Team: Jerrol Banana., MD as PCP - General (Unknown Physician Specialty) [place], Peter M, MD as PCP - Cardiology (Cardiology) Dingeldein, Remo Lipps, MD  as Consulting Physician (Ophthalmology) Ralene Bathe, MD (Dermatology) Marry Guan, Laurice Record, MD (Orthopedic Surgery) Caroline More, DPM as Consulting Physician (Podiatry) Germaine Pomfret, [facility] as Pharmacist (Pharmacist)   Medications: Outpatient Medications Prior to Visit  Medication Sig   apixaban (ELIQUIS) 5 MG TABS tablet Take 1 tablet (5 mg total) by mouth 2 (two) times daily. (Patient not taking: Reported on 10/26/2021)   diphenhydramine-acetaminophen (TYLENOL PM) 25-500 MG TABS Take 2 tablets by mouth at bedtime.   doxazosin (CARDURA) 8 MG tablet Take 1 tablet (8 mg total) by mouth daily.   doxycycline (VIBRA-TABS) 100 MG tablet Take 1 tablet (100 mg total) by mouth 2 (two) times daily.   fluconazole (DIFLUCAN) 200 MG tablet Take 1 tablet (200 mg total) by mouth as directed. Take 1 PO on Mon, Wed and Fri for 3 doses   furosemide (LASIX) 40 MG tablet Take 1 tablet by mouth once daily (dose change)   Hydrocortisone-Iodoquinol (IODOQUINOL-HC EX) Apply 1 application topically daily as needed.   levothyroxine (SYNTHROID) 75 MCG tablet Take 1 tablet by mouth daily in the morning before breakfast   ramipril (ALTACE) 5 MG capsule Take 1 capsule by mouth once daily   rosuvastatin (CRESTOR) 10 MG tablet Take 1 tablet by mouth once daily   topiramate (TOPAMAX) 50 MG tablet Take 1 tablet by mouth twice daily   traMADol (ULTRAM) 50 MG tablet Take 1-2 tablets (50-100 mg total) by mouth every 6 (six) hours as needed for  moderate pain.   No facility-administered medications prior to visit.    Review of Systems  All other systems reviewed and are negative.   {Labs  Heme  Chem  Endocrine  Serology  Results Review (optional):23779}  Objective    There were no vitals taken for this visit. {Show previous vital signs (optional):23777}   Physical Exam  ***  Last depression screening scores    06/05/2021    3:08 PM 05/31/2020    3:00 PM 03/20/2020   12:55 PM  PHQ 2/9 Scores  PHQ - 2 Score 0 0 0   Last fall risk screening    06/05/2021    3:11 PM  [place] in the past year? 0  Number falls in past yr: 0  Injury with Fall? 0  Risk for fall due to : No Fall Risks  Follow up Falls evaluation completed   Last Audit-C alcohol use screening    06/05/2021    3:08 PM  Alcohol Use Disorder Test (AUDIT)  1. How often do you have a drink containing alcohol? 0  2. How many drinks containing alcohol do you have on a typical day when you are drinking? 0  3. How often do you have six or more drinks on one occasion? 0  AUDIT-C Score 0   A score of 3 or more in women, and 4 or more in men indicates increased risk for alcohol abuse, EXCEPT if all of the points are from question 1   No results found for any visits on 01/02/22.  Assessment & Plan    Routine Health Maintenance and Physical Exam  Exercise Activities and Dietary recommendations  Goals      DIET - EAT MORE FRUITS AND VEGETABLES     Exercise 3x per week (30 min per time)     Recommend to start exercising 3 times a week for 30 minutes at a time.       Prevent falls     Recommend to  remove any items from the home that may cause slips or trips.     Track and Manage My Blood Pressure-Hypertension     Timeframe:  Long-Range Goal Priority:  High Start Date:  12/11/20                            Expected End Date: 12/11/21                       Follow Up within 90 days    - check blood pressure weekly    Why is this  important?   You won't feel high blood pressure, but it can still hurt your blood vessels.  High blood pressure can cause heart or kidney problems. It can also cause a stroke.  Making lifestyle changes like losing a little weight or eating less salt will help.  Checking your blood pressure at home and at different times of the day can help to control blood pressure.  If the doctor prescribes medicine remember to take it the way the doctor ordered.  Call the office if you cannot afford the medicine or if there are questions about it.     Notes:         Immunization History  Administered Date(s) Administered   Fluad Quad(high Dose 65+) 02/24/2019, 02/14/2020, 03/28/2021   Influenza Split 02/20/2011, 02/27/2012   Influenza, High Dose Seasonal PF 02/22/2014, 02/18/2015, 02/29/2016, 02/26/2017, 04/22/2018   Influenza,inj,Quad PF,6+ Mos 02/13/2013   Influenza-Unspecified 02/24/2013   PFIZER(Purple Top)SARS-COV-2 Vaccination 06/17/2019, 07/08/2019, 04/26/2020   Pneumococcal Conjugate-13 04/14/2014   Pneumococcal Polysaccharide-23 03/25/1997, 04/05/2003   Td 08/29/2003   Tdap 02/20/2011, 02/14/2020   Zoster Recombinat (Shingrix) 02/26/2017, 06/25/2017   Zoster, Live 05/12/2007    Health Maintenance  Topic Date Due   FOOT EXAM  Never done   OPHTHALMOLOGY EXAM  04/26/2020   COVID-19 Vaccine (4 - Booster for Pfizer series) 06/21/2020   INFLUENZA VACCINE  12/25/2021   HEMOGLOBIN A1C  02/15/2022   TETANUS/TDAP  02/13/2030   Pneumonia Vaccine 27+ Years old  Completed   Zoster Vaccines- Shingrix  Completed   HPV VACCINES  Aged Out    Discussed health benefits of physical activity, and encouraged him to engage in regular exercise appropriate for his age and condition.  ***  No follow-ups on file.     {provider attestation***:1}   Wilhemena Durie, MD  [facility] [phone] (phone) [phone] (fax)  [place]

## 2022-01-01 ENCOUNTER — Telehealth: Payer: Self-pay

## 2022-01-01 ENCOUNTER — Encounter: Payer: Self-pay | Admitting: Family Medicine

## 2022-01-01 ENCOUNTER — Ambulatory Visit (INDEPENDENT_AMBULATORY_CARE_PROVIDER_SITE_OTHER): Payer: PPO | Admitting: Family Medicine

## 2022-01-01 VITALS — BP 163/77 | HR 68 | Resp 18 | Wt 246.0 lb

## 2022-01-01 DIAGNOSIS — L309 Dermatitis, unspecified: Secondary | ICD-10-CM | POA: Diagnosis not present

## 2022-01-01 MED ORDER — CETIRIZINE HCL 10 MG PO TABS: 10.0000 mg | ORAL_TABLET | Freq: Every day | ORAL | 0 refills | Status: DC

## 2022-01-01 MED ORDER — CLOTRIMAZOLE-BETAMETHASONE 1-0.05 % EX LOTN: TOPICAL_LOTION | Freq: Two times a day (BID) | CUTANEOUS | 1 refills | Status: DC

## 2022-01-01 NOTE — Telephone Encounter (Signed)
Pt called left message on the voicemail his back is itching extremely bad, he would like to know what she he be using to help stop the itch.    Called left message with pt wife, returning his call please call back to discuss irritation on his back

## 2022-01-01 NOTE — Progress Notes (Signed)
Established patient visit  I,[name],acting as a scribe for [name], MD.,have documented all relevant documentation on the behalf of [name], MD,as directed by  [name], MD while in the presence of [name], MD.   Patient: Jared Rice   DOB: [birth year]   89 y.o. Male  MRN: [id number] Visit Date: 01/01/2022  Today's healthcare provider: [name], MD   Chief Complaint  Patient presents with   Rash   Subjective    Rash This is a new problem. The current episode started in the past 7 days (5 days). The affected locations include the back and chest. The rash is characterized by itchiness and redness. He was exposed to nothing. Pertinent negatives include no congestion, cough, diarrhea, eye pain, facial edema, fatigue, fever, joint pain, nail changes, rhinorrhea, shortness of breath, sore throat or vomiting. Past treatments include anti-itch cream. The treatment provided no relief.    Patient has had a rash on back and upper chest for 5 days. Patient states no new medications, detergents, soaps, lotions, or fabric softeners. Patient states rash is extremely itchy. Patient has been treating rash with several different otc creams with no relief.  Medications: Outpatient Medications Prior to Visit  Medication Sig   apixaban (ELIQUIS) 5 MG TABS tablet Take 1 tablet (5 mg total) by mouth 2 (two) times daily.   diphenhydramine-acetaminophen (TYLENOL PM) 25-500 MG TABS Take 2 tablets by mouth at bedtime.   doxazosin (CARDURA) 8 MG tablet Take 1 tablet (8 mg total) by mouth daily.   doxycycline (VIBRA-TABS) 100 MG tablet Take 1 tablet (100 mg total) by mouth 2 (two) times daily.   fluconazole (DIFLUCAN) 200 MG tablet Take 1 tablet (200 mg total) by mouth as directed. Take 1 PO on Mon, Wed and Fri for 3 doses   furosemide (LASIX) 40 MG tablet Take 1 tablet by mouth once daily (dose change)   Hydrocortisone-Iodoquinol (IODOQUINOL-HC EX) Apply 1 application  topically daily as needed.   levothyroxine (SYNTHROID) 75 MCG tablet Take 1 tablet by mouth daily in the morning before breakfast   ramipril (ALTACE) 5 MG capsule Take 1 capsule by mouth once daily   rosuvastatin (CRESTOR) 10 MG tablet Take 1 tablet by mouth once daily   topiramate (TOPAMAX) 50 MG tablet Take 1 tablet by mouth twice daily   traMADol (ULTRAM) 50 MG tablet Take 1-2 tablets (50-100 mg total) by mouth every 6 (six) hours as needed for moderate pain.   No facility-administered medications prior to visit.    Review of Systems  Constitutional:  Negative for fatigue and fever.  HENT:  Negative for congestion, rhinorrhea and sore throat.   Eyes:  Negative for pain.  Respiratory:  Negative for cough and shortness of breath.   Gastrointestinal:  Negative for diarrhea and vomiting.  Musculoskeletal:  Negative for joint pain.  Skin:  Positive for rash. Negative for nail changes.       Objective    BP (!) 163/77 (BP Location: Left Arm, Patient Position: Sitting, Cuff Size: Large)   Pulse 68   Resp 18   Wt 246 lb (111.6 kg)   SpO2 95%   BMI 32.46 kg/m    Physical Exam     Assessment & Plan     1. Dermatitis Non-specific, few vesicular lesions suspicious for urticaria, although seems to be spreading with much smaller rash on upper chest.   Will try clotrimazole-betamethasone (LOTRISONE) lotion; Apply topically 2 (two) times daily.  Dispense: 60 mL; Refill: 1 - cetirizine (ZYRTEC ALLERGY) 10 MG tablet; Take 1 tablet (10 mg total) by mouth daily.  Dispense: 30 tablet; Refill: 0   Call if symptoms change or if not rapidly improving.        The entirety of the information documented in the History of Present Illness, Review of Systems and Physical Exam were personally obtained by me. Portions of this information were initially documented by the CMA and reviewed by me for thoroughness and accuracy.     [name], MD  [facility] [phone]  (phone) [phone] (fax)  [place]

## 2022-01-02 ENCOUNTER — Encounter: Payer: PPO | Admitting: Family Medicine

## 2022-01-02 DIAGNOSIS — Z0000 Encounter for general adult medical examination without abnormal findings: Secondary | ICD-10-CM

## 2022-01-03 ENCOUNTER — Ambulatory Visit: Payer: PPO | Admitting: Dermatology

## 2022-01-14 ENCOUNTER — Ambulatory Visit: Payer: PPO | Admitting: Dermatology

## 2022-01-14 DIAGNOSIS — R21 Rash and other nonspecific skin eruption: Secondary | ICD-10-CM | POA: Diagnosis not present

## 2022-01-14 MED ORDER — TRIAMCINOLONE ACETONIDE 0.1 % EX CREA: TOPICAL_CREAM | CUTANEOUS | 0 refills | Status: DC

## 2022-01-14 NOTE — Patient Instructions (Signed)
Gentle Skin Care Guide  1. Bathe no more than once a day.  2. Avoid bathing in hot water  3. Use a mild soap like Dove, Vanicream, Cetaphil, CeraVe. Can use Lever 2000 or Cetaphil antibacterial soap  4. Use soap only where you need it. On most days, use it under your arms, between your legs, and on your feet. Let the water rinse other areas unless visibly dirty.  5. When you get out of the bath/shower, use a towel to gently blot your skin dry, don't rub it.  6. While your skin is still a little damp, apply a moisturizing cream such as Vanicream, CeraVe, Cetaphil, Eucerin, Sarna lotion or plain Vaseline Jelly. For hands apply Neutrogena [place] Hand Cream or Excipial Hand Cream.  7. Reapply moisturizer any time you start to itch or feel dry.  8. Sometimes using free and clear laundry detergents can be helpful. Fabric softener sheets should be avoided. Downy Free & Gentle liquid, or any liquid fabric softener that is free of dyes and perfumes, it acceptable to use  Due to recent changes in healthcare laws, you may see results of your pathology and/or laboratory studies on MyChart before the doctors have had a chance to review them. We understand that in some cases there may be results that are confusing or concerning to you. Please understand that not all results are received at the same time and often the doctors may need to interpret multiple results in order to provide you with the best plan of care or course of treatment. Therefore, we ask that you please give [place] 2 business days to thoroughly review all your results before contacting the office for clarification. Should we see a critical lab result, you will be contacted sooner.   If You Need Anything After Your Visit  If you have any questions or concerns for your doctor, please call our main line at [phone] and press option 4 to reach your doctor's medical assistant. If no one answers, please leave a voicemail as directed and we  will return your call as soon as possible. Messages left after 4 pm will be answered the following business day.   You may also send [place] a message via [place]. We typically respond to MyChart messages within 1-2 business days.  For prescription refills, please ask your pharmacy to contact our office. Our fax number is [phone].  If you have an urgent issue when the clinic is closed that cannot wait until the next business day, you can page your doctor at the number below.    Please note that while we do our best to be available for urgent issues outside of office hours, we are not available 24/7.   If you have an urgent issue and are unable to reach [place], you may choose to seek medical care at your doctor's office, retail clinic, urgent care center, or emergency room.  If you have a medical emergency, please immediately call 911 or go to the emergency department.  Pager Numbers  - Dr. Nehemiah Massed: [phone]  - Dr. Laurence Ferrari: [phone]  - Dr. Nicole Kindred: [phone]  In the event of inclement weather, please call our main line at [phone] for an update on the status of any delays or closures.  Dermatology Medication Tips: Please keep the boxes that topical medications come in in order to help keep track of the instructions about where and how to use these. Pharmacies typically print the medication instructions only on the boxes and not directly on  the medication tubes.   If your medication is too expensive, please contact our office at [phone] option 4 or send [place] a message through [place].   We are unable to tell what your co-pay for medications will be in advance as this is different depending on your insurance coverage. However, we may be able to find a substitute medication at lower cost or fill out paperwork to get insurance to cover a needed medication.   If a prior authorization is required to get your medication covered by your insurance company, please allow [place] 1-2  business days to complete this process.  Drug prices often vary depending on where the prescription is filled and some pharmacies may offer cheaper prices.  The website www.goodrx.com contains coupons for medications through different pharmacies. The prices here do not account for what the cost may be with help from insurance (it may be cheaper with your insurance), but the website can give you the price if you did not use any insurance.  - You can print the associated coupon and take it with your prescription to the pharmacy.  - You may also stop by our office during regular business hours and pick up a GoodRx coupon card.  - If you need your prescription sent electronically to a different pharmacy, notify our office through [facility] or by phone at [phone] option 4.     Si Usted Necesita Algo Despus de Su Visita  Tambin puede enviarnos un mensaje a travs de Pharmacist, community. Por lo general respondemos a los mensajes de MyChart en el transcurso de 1 a 2 das hbiles.  Para renovar recetas, por favor pida a su farmacia que se ponga en contacto con nuestra oficina. Harland Dingwall de fax es [place] [phone].  Si tiene un asunto urgente cuando la clnica est cerrada y que no puede esperar hasta el siguiente da hbil, puede llamar/localizar a su doctor(a) al nmero que aparece a continuacin.   Por favor, tenga en cuenta que aunque hacemos todo lo posible para estar disponibles para asuntos urgentes fuera del horario de [place], no estamos disponibles las 24 horas del da, los 7 das de la [place].   Si tiene un problema urgente y no puede comunicarse con nosotros, puede optar por buscar atencin mdica  en el consultorio de su doctor(a), en una clnica privada, en un centro de atencin urgente o en una sala de emergencias.  Si tiene Engineering geologist, por favor llame inmediatamente al 911 o vaya a la sala de emergencias.  Nmeros de bper  - Dr. Nehemiah Massed: [phone]  - Dra.  Moye: [phone]  - Dra. Nicole Kindred: [phone]  En caso de inclemencias del [place], por favor llame a Johnsie Kindred principal al [phone] para una actualizacin sobre el [place] de cualquier retraso o cierre.  Consejos para la medicacin en dermatologa: Por favor, guarde las cajas en las que vienen los medicamentos de uso tpico para ayudarle a seguir las instrucciones sobre dnde y cmo usarlos. Las farmacias generalmente imprimen las instrucciones del medicamento slo en las cajas y no directamente en los tubos del [place].   Si su medicamento es muy caro, por favor, pngase en contacto con Zigmund Daniel llamando al [phone] y presione la opcin 4 o envenos un mensaje a travs de Pharmacist, community.   No podemos decirle cul ser su copago por los medicamentos por adelantado ya que esto es diferente dependiendo de la cobertura de su seguro. Sin embargo, es posible que podamos encontrar un medicamento sustituto a  menor costo o llenar un formulario para que el seguro cubra el medicamento que se considera necesario.   Si se requiere una autorizacin previa para que su compaa de seguros [place] su medicamento, por favor permtanos de 1 a 2 das hbiles para completar este proceso.  Los precios de los medicamentos varan con frecuencia dependiendo del Environmental consultant de dnde se surte la receta y alguna farmacias pueden ofrecer precios ms baratos.  El sitio web www.goodrx.com tiene cupones para medicamentos de Airline pilot. Los precios aqu no tienen en cuenta lo que podra costar con la ayuda del seguro (puede ser ms barato con su seguro), pero el sitio web puede darle el precio si no utiliz Research scientist (physical sciences).  - Puede imprimir el cupn correspondiente y llevarlo con su receta a la farmacia.  - Tambin puede pasar por nuestra oficina durante el horario de atencin regular y Charity fundraiser una tarjeta de cupones de GoodRx.  - Si necesita que su receta se enve electrnicamente a Chiropodist,  informe a nuestra oficina a travs de MyChart de [place] o por telfono llamando al [phone] y presione la opcin 4.   9. If your doctor has given you prescription creams you may apply moisturizers over them

## 2022-01-14 NOTE — Progress Notes (Unsigned)
   Follow-Up Visit   Subjective  Jared Rice is a 88 y.o. male who presents for the following: Itching/rash (On the chest and back - very itch and irritating. Currently using Cortisone cream and a back scratcher. No new medications, soaps, or detergents since rash started about two weeks ago).  The following portions of the chart were reviewed this encounter and updated as appropriate:   Tobacco  Allergies  Meds  Problems  Med Hx  Surg Hx  Fam Hx     Review of Systems:  No other skin or systemic complaints except as noted in HPI or Assessment and Plan.  Objective  Well appearing patient in no apparent distress; mood and affect are within normal limits.  A focused examination was performed including the face, trunk, and extremities. Relevant physical exam findings are noted in the Assessment and Plan.  Chest, back Scaly erythematous papules and patches +/- dyspigmentation, lichenification, excoriations.             Assessment & Plan  Rash and other nonspecific skin eruption Chest, back Atopic dermatitis vs contact dermatitis  Start TMC 0.1% cream BID x 2 weeks. Topical steroids (such as triamcinolone, fluocinolone, fluocinonide, mometasone, clobetasol, halobetasol, betamethasone, hydrocortisone) can cause thinning and lightening of the skin if they are used for too long in the same area. Your physician has selected the right strength medicine for your problem and area affected on the body. Please use your medication only as directed by your physician to prevent side effects.   If not improving after two weeks patient to contact the office to be worked in for a biopsy.  May also consider patch testing in the future.  D/C Dial soap. Recommend gentle cleansers like Dove. Samples given of Dove, Cetaphil, and Cln gentle cleansers.  triamcinolone cream (KENALOG) 0.1 % - Chest, back Apply to aa's rash BID x 2 weeks. Avoid applying to face, groin, and axilla. Use as directed.  Long-term use can cause thinning of the skin.  Return in about 6 months (around 07/17/2022) for TBSE.  [name], CMA, am acting as scribe for Sarina Ser, MD . Documentation: I have reviewed the above documentation for accuracy and completeness, and I agree with the above.  Sarina Ser, MD

## 2022-01-16 ENCOUNTER — Encounter: Payer: Self-pay | Admitting: Dermatology

## 2022-02-11 ENCOUNTER — Telehealth: Payer: Self-pay

## 2022-02-11 NOTE — Progress Notes (Signed)
Chronic Care Management Pharmacy Assistant   Name: BEACHER EVERY  MRN: [id number] DOB: 08/15/1932  Reason for Encounter: Hypertension Disease State Call   Recent office visits:  01/01/2022 [name], MD (PCP Office Visit) for Rash- Started: Cetirizine HCl 10 mg daily, Clotrimazole-Betamethasone 1-0.05% topical twice daily, No orders placed, BP 163/77  Recent consult visits:  01/14/2022 Sarina Ser, MD (Dermatology) for Rash- Started: Triamcinolone Acetonide 0.1% apply BID X 2 weeks, No orders placed, Patient to follow-up in 6 months  12/20/2021 Hezzie Bump, MD (Orthopedic) for Follow-up- No medication changes noted, Patient to follow-up as needed  Hospital visits:  None in previous 6 months  Medications: Outpatient Encounter Medications as of 02/11/2022  Medication Sig   apixaban (ELIQUIS) 5 MG TABS tablet Take 1 tablet (5 mg total) by mouth 2 (two) times daily.   cetirizine (ZYRTEC ALLERGY) 10 MG tablet Take 1 tablet (10 mg total) by mouth daily.   clotrimazole-betamethasone (LOTRISONE) lotion Apply topically 2 (two) times daily.   diphenhydramine-acetaminophen (TYLENOL PM) 25-500 MG TABS Take 2 tablets by mouth at bedtime.   doxazosin (CARDURA) 8 MG tablet Take 1 tablet (8 mg total) by mouth daily.   doxycycline (VIBRA-TABS) 100 MG tablet Take 1 tablet (100 mg total) by mouth 2 (two) times daily.   fluconazole (DIFLUCAN) 200 MG tablet Take 1 tablet (200 mg total) by mouth as directed. Take 1 PO on Mon, Wed and Fri for 3 doses   furosemide (LASIX) 40 MG tablet Take 1 tablet by mouth once daily (dose change)   Hydrocortisone-Iodoquinol (IODOQUINOL-HC EX) Apply 1 application topically daily as needed.   levothyroxine (SYNTHROID) 75 MCG tablet Take 1 tablet by mouth daily in the morning before breakfast   ramipril (ALTACE) 5 MG capsule Take 1 capsule by mouth once daily   rosuvastatin (CRESTOR) 10 MG tablet Take 1 tablet by mouth once daily   topiramate (TOPAMAX) 50  MG tablet Take 1 tablet by mouth twice daily   traMADol (ULTRAM) 50 MG tablet Take 1-2 tablets (50-100 mg total) by mouth every 6 (six) hours as needed for moderate pain.   triamcinolone cream (KENALOG) 0.1 % Apply to aa's rash BID x 2 weeks. Avoid applying to face, groin, and axilla. Use as directed. Long-term use can cause thinning of the skin.   No facility-administered encounter medications on file as of 02/11/2022.   Care Gaps: Diabetic Foot Exam Diabetic Eye Exam Influenza Vaccine BP> 140/90  Star Rating Drugs: Ramipril 5 mg last filled on 12/21/2021 for a 90-Day supply with [facility] Rosuvastatin 10 mg last filled on 11/22/2021 for a 90-Day supply with [facility]  Reviewed chart prior to disease state call. Spoke with patient regarding BP  Recent Office Vitals: BP Readings from Last 3 Encounters:  01/01/22 (!) 163/77  10/26/21 (!) 155/88  08/14/21 (!) 143/72   Pulse Readings from Last 3 Encounters:  01/01/22 68  10/26/21 70  08/14/21 62    Wt Readings from Last 3 Encounters:  01/01/22 246 lb (111.6 kg)  10/26/21 249 lb 4.8 oz (113.1 kg)  08/14/21 255 lb (115.7 kg)   Kidney Function Lab Results  Component Value Date/Time   CREATININE 1.10 10/26/2021 11:19 AM   CREATININE 1.30 (H) 08/15/2021 08:45 AM   CREATININE 0.87 10/13/2013 10:55 AM   GFRNONAA >60 08/24/2020 12:18 PM   GFRNONAA >60 10/13/2013 10:55 AM   GFRAA 74 07/05/2020 10:31 AM   GFRAA >60 10/13/2013 10:55 AM  Latest Ref Rng & Units 10/26/2021   11:19 AM 08/15/2021    8:45 AM 07/19/2021    3:56 PM  BMP  Glucose 70 - 99 mg/dL 97  90  82   BUN 8 - 27 mg/dL '15  24  20   '$ Creatinine 0.76 - 1.27 mg/dL 1.10  1.30  0.97   BUN/Creat Ratio 10 - '24 14  18  21   '$ Sodium 134 - 144 mmol/L 139  144  142   Potassium 3.5 - 5.2 mmol/L 3.9  4.0  4.4   Chloride 96 - 106 mmol/L 101  108  107   CO2 20 - 29 mmol/L '26  26  25   '$ Calcium 8.6 - 10.2 mg/dL 8.7  8.7  9.0    Current antihypertensive  regimen:  Doxazosin 8 mg nightly Furosemide 40 mg daily Ramipril 5 mg daily   How often are you checking your Blood Pressure? daily Current home BP readings:  53/74, 827M systolics   What recent interventions/DTPs have been made by any provider to improve Blood Pressure control since last CPP Visit: None  Any recent hospitalizations or ED visits since last visit with CPP? No What diet changes have been made to improve Blood Pressure Control?  None   Adherence Review: Is the patient currently on ACE/ARB medication? Yes Does the patient have >5 day gap between last estimated fill dates? No  I spoke with the patient and he reports outside of the rash he has he is doing well. The patient reports he still has a rash and it still itches very badly. He has seen dermatology that prescribed him a topical ointment per patient but he is still itching. The patient stated he is using the cream/ointment as directed. Per patient that is his only issue at this time.  The patient did want to follow-up with Dr. Rosanna Randy prior to him leaving and I was able to get him transferred over to the front desk to see if they would be able to get him scheduled. Patient has no other concerns or issues prior to my transfer.  Patient has a follow-up appointment with Junius Argyle, CPP on 05/06/2022 @ 1500.  Lynann Bologna, CPA/CMA Clinical Pharmacist Assistant Phone: [phone]

## 2022-02-16 IMAGING — CT CT ANGIO CHEST
3 of 7 series · 18 of 46 positions shown · IV contrast (omnipaque)
Comparison: 02/24/2019

CLINICAL DATA: Follow-up thoracic aneurysm

EXAM:
CT ANGIOGRAPHY CHEST WITH CONTRAST
TECHNIQUE: Multidetector CT imaging of the chest was performed using the
standard protocol during bolus administration of intravenous
contrast. Multiplanar CT image reconstructions and MIPs were
obtained to evaluate the vascular anatomy.
CONTRAST:  100mL OMNIPAQUE IOHEXOL 350 MG/ML SOLN

[Series 6: dissection 2mm · axial · 0.90mm/px · z∈[+1369,+1669]mm · 13 of 176 slices shown]
[im 13/176  lung]
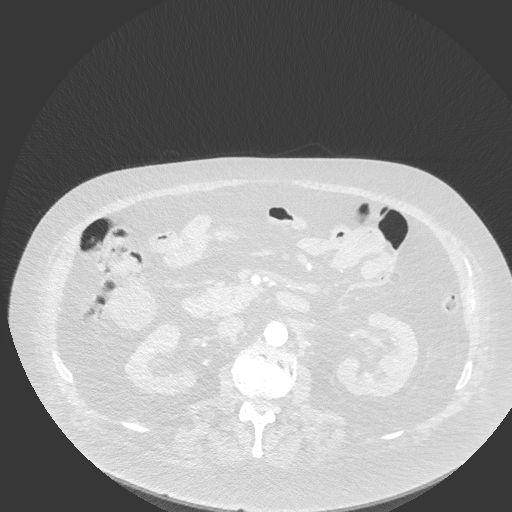
[im 26/176  soft-tissue]
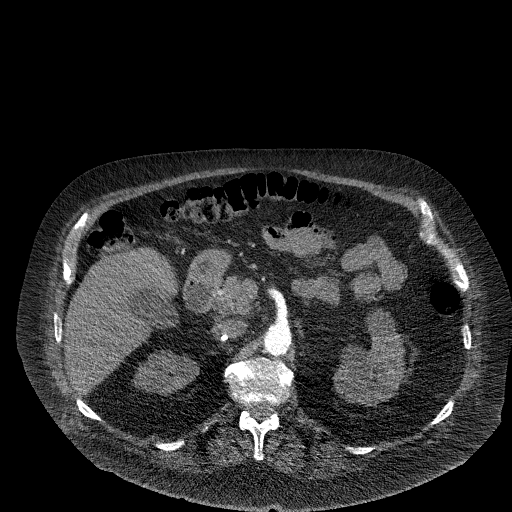
[im 38/176  lung]
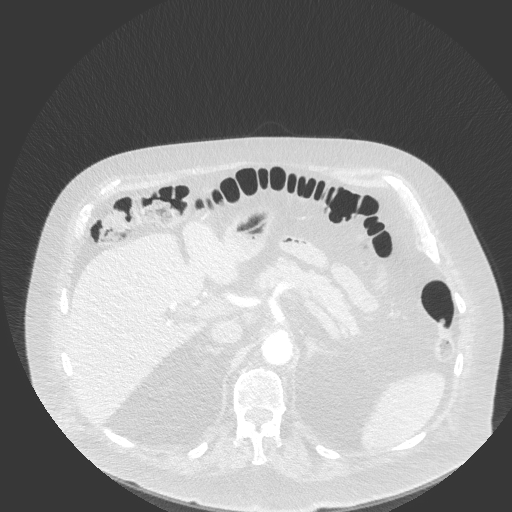
[im 51/176  soft-tissue]
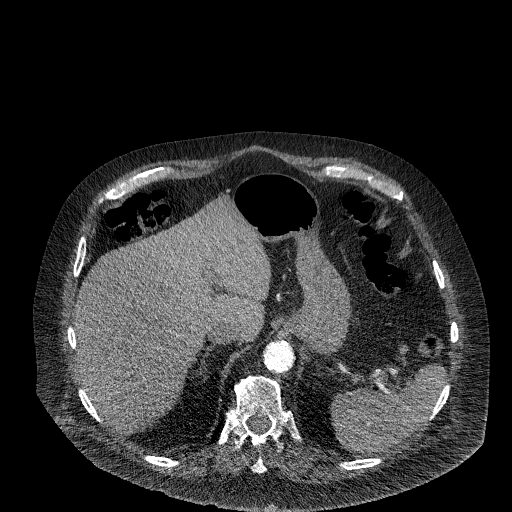
[im 63/176  lung]
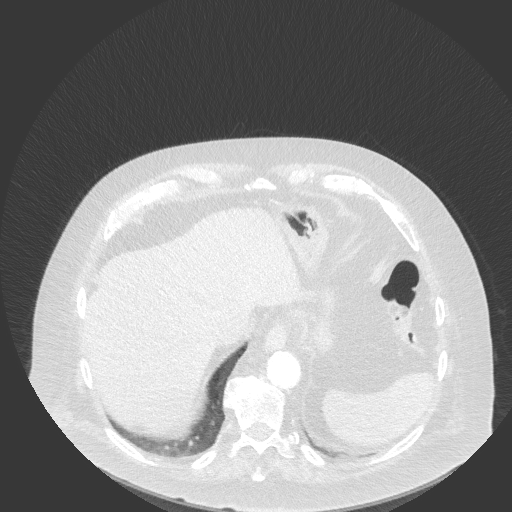
[im 76/176  soft-tissue]
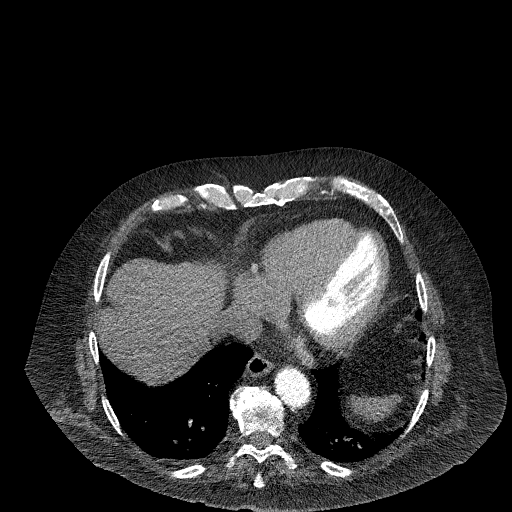
[im 88/176  lung]
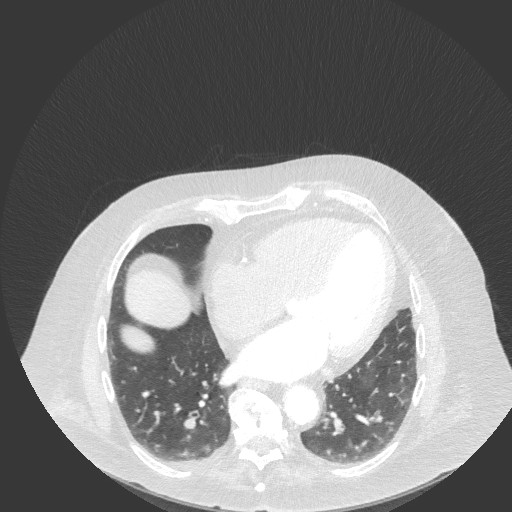
[im 101/176  soft-tissue]
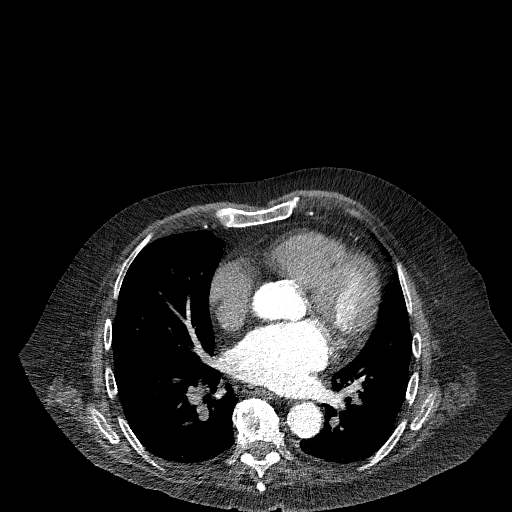
[im 113/176  lung]
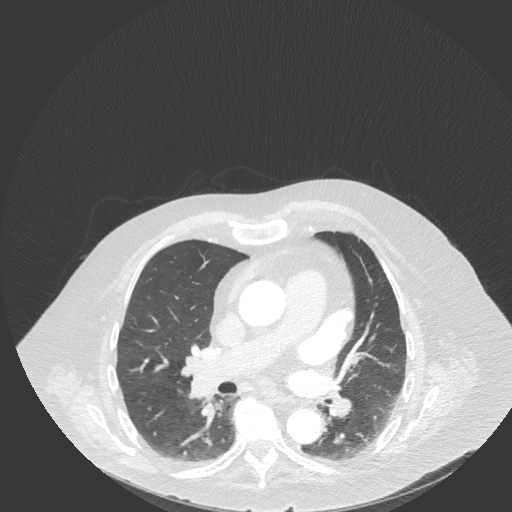
[im 126/176  soft-tissue]
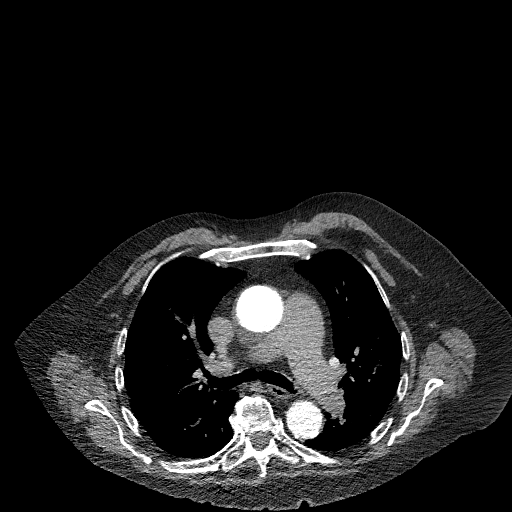
[im 138/176  lung]
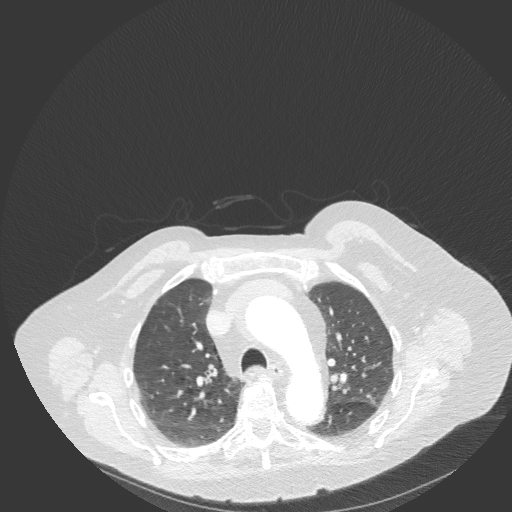
[im 151/176  soft-tissue]
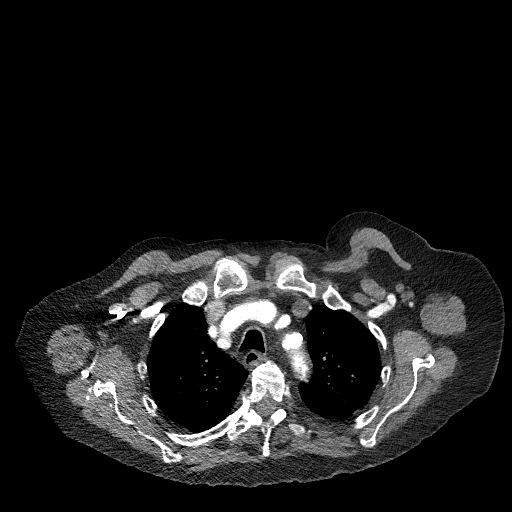
[im 163/176  lung]
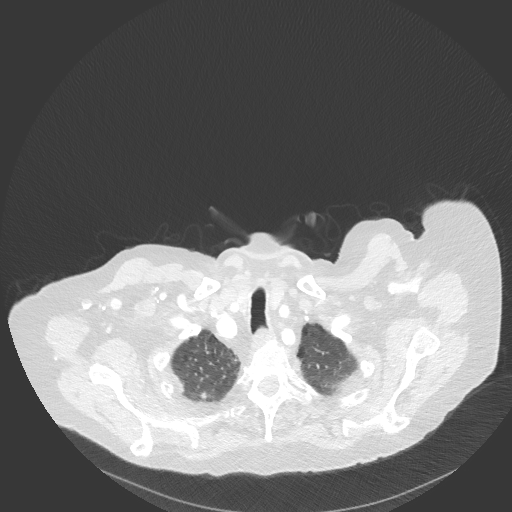

[Series 7: lung · axial · 0.90mm/px · z∈[+1473,+1497]mm · 2 of 124 slices shown]
[im 13/124  soft-tissue]
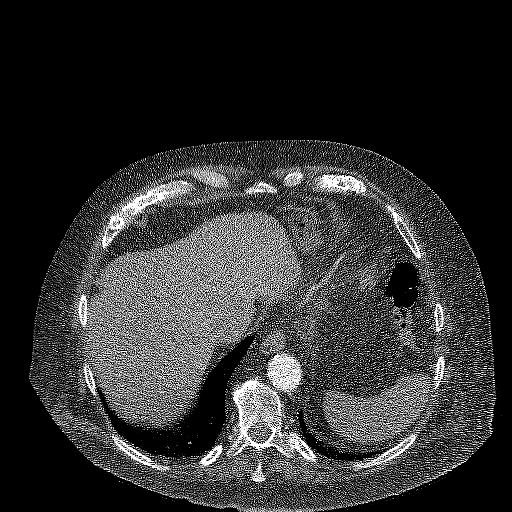
[im 25/124  soft-tissue]
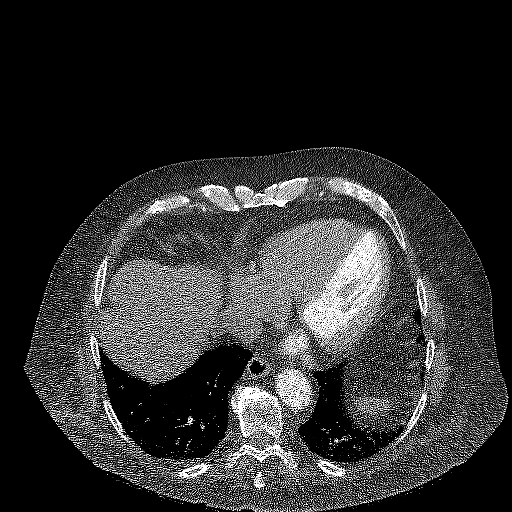

[Series 9: dissection 2mm cor · coronal · 0.69mm/px · 3 of 145 slices shown]
[im 37/145  soft-tissue]
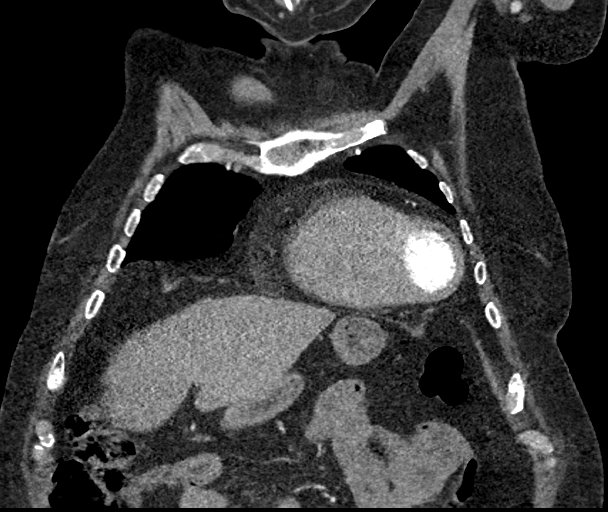
[im 73/145  soft-tissue]
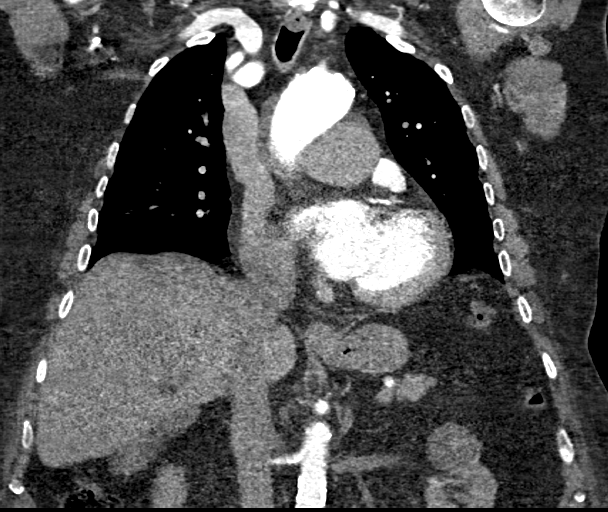
[im 109/145  soft-tissue]
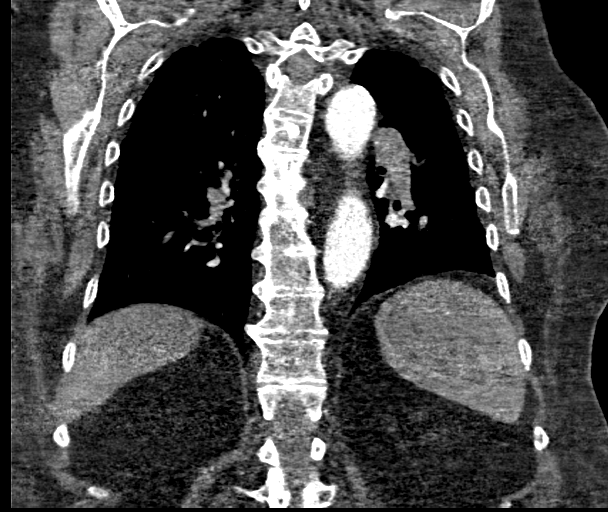

[18 of 46 positions shown; findings below may reference images not displayed]

FINDINGS: Cardiovascular: Initial precontrast images were obtained but failed
to demonstrate any findings suggestive of acute aortic injury.

Post-contrast images demonstrate atherosclerotic calcification
without evidence of dissection. The ascending aorta is mildly
prominent at 4 cm stable in appearance from the prior exam. Coronary
calcifications are noted. Heart is at the upper limits of normal in
size. Pulmonary artery as visualized shows no significant
enlargement.

Mediastinum/Nodes: Thoracic inlet is within normal limits. No
sizable hilar or mediastinal adenopathy is noted. The esophagus as
visualized is within normal limits.

Lungs/Pleura: Lungs are well aerated bilaterally. Some dependent
atelectatic changes are noted. No focal infiltrate or sizable
parenchymal nodule is seen. Mild emphysematous changes are noted.

Upper Abdomen: Small dependent gallstone is noted within the
gallbladder. Left renal cyst is noted in a parapelvic location
stable from prior exam. Cortical cysts are noted bilaterally.

Musculoskeletal: Degenerative changes of the thoracic spine are
noted. No acute rib abnormality is seen. Prior healed sternal
fracture is noted.

Review of the MIP images confirms the above findings.
IMPRESSION: Mild dilatation of the ascending aorta to 4 cm. This is stable from
the prior exam. Recommend annual imaging followup by CTA or MRA.
This recommendation follows 4949
ACCF/AHA/AATS/ACR/ASA/SCA/ERNO/SEVERJANEN/THOMASINA/TIGER Guidelines for the
Diagnosis and Management of Patients with Thoracic Aortic Disease.
Circulation. 4949; 121: E266-e369. Aortic aneurysm NOS (QJGAN-ZDC.I)

Cholelithiasis without complicating factors.

Renal cyst bilaterally

Aortic Atherosclerosis (QJGAN-ZMH.H) and Emphysema (QJGAN-KCK.N).

## 2022-02-23 ENCOUNTER — Other Ambulatory Visit: Payer: Self-pay | Admitting: Dermatology

## 2022-02-23 DIAGNOSIS — R21 Rash and other nonspecific skin eruption: Secondary | ICD-10-CM

## 2022-03-17 ENCOUNTER — Other Ambulatory Visit: Payer: Self-pay | Admitting: Family Medicine

## 2022-03-18 ENCOUNTER — Ambulatory Visit (INDEPENDENT_AMBULATORY_CARE_PROVIDER_SITE_OTHER): Payer: PPO | Admitting: Family Medicine

## 2022-03-18 ENCOUNTER — Encounter: Payer: Self-pay | Admitting: Family Medicine

## 2022-03-18 ENCOUNTER — Ambulatory Visit: Payer: Self-pay | Admitting: *Deleted

## 2022-03-18 VITALS — BP 137/70 | HR 67 | Temp 97.6°F | Resp 18 | Wt 262.0 lb

## 2022-03-18 DIAGNOSIS — L03115 Cellulitis of right lower limb: Secondary | ICD-10-CM | POA: Diagnosis not present

## 2022-03-18 MED ORDER — CEFDINIR 300 MG PO CAPS: 600.0000 mg | ORAL_CAPSULE | Freq: Every day | ORAL | 0 refills | Status: AC

## 2022-03-18 NOTE — Telephone Encounter (Signed)
  Chief Complaint: right leg swelling  Symptoms: right leg swelling from ankle to knee. Pitting edema, pain to touch, red, can walk on leg.  Frequency: 3 days  Pertinent Negatives: Patient denies chest pain no difficulty breathing, no fever.  Disposition: '[]'$ ED /'[]'$ Urgent Care (no appt availability in office) / '[x]'$ Appointment(In office/virtual)/ '[]'$  [facility]/ '[]'$ Home Care/ '[]'$ Refused Recommended Disposition /'[]'$ [facility]/ '[]'$  Follow-up with PCP Additional Notes:   Appt today .    Reason for Disposition  [1] MODERATE leg swelling (e.g., swelling extends up to knees) AND [2] new-onset or worsening  Answer Assessment - Initial Assessment Questions 1. ONSET: "When did the swelling start?" (e.g., minutes, hours, days)     3 days ago  2. LOCATION: "What part of the leg is swollen?"  "Are both legs swollen or just one leg?"     Right leg from ankle to knee 3. SEVERITY: "How bad is the swelling?" (e.g., localized; mild, moderate, severe)   - Localized: Small area of swelling localized to one leg.   - MILD pedal edema: Swelling limited to foot and ankle, pitting edema < 1/4 inch (6 mm) deep, rest and elevation eliminate most or all swelling.   - MODERATE edema: Swelling of lower leg to knee, pitting edema > 1/4 inch (6 mm) deep, rest and elevation only partially reduce swelling.   - SEVERE edema: Swelling extends above knee, facial or hand swelling present.      Moderate  4. REDNESS: "Does the swelling look red or infected?"     Red  5. PAIN: "Is the swelling painful to touch?" If Yes, ask: "How painful is it?"   (Scale 1-10; mild, moderate or severe)     Yes  6. FEVER: "Do you have a fever?" If Yes, ask: "What is it, how was it measured, and when did it start?"      No  7. CAUSE: "What do you think is causing the leg swelling?"     Not sure  8. MEDICAL HISTORY: "Do you have a history of blood clots (e.g., DVT), cancer, heart failure, kidney disease, or liver failure?"      Hx heart issues  9. RECURRENT SYMPTOM: "Have you had leg swelling before?" If Yes, ask: "When was the last time?" "What happened that time?"     na 10. OTHER SYMPTOMS: "Do you have any other symptoms?" (e.g., chest pain, difficulty breathing)       No  11. PREGNANCY: "Is there any chance you are pregnant?" "When was your last menstrual period?"       na  Protocols used: Leg Swelling and Edema-A-AH

## 2022-03-18 NOTE — Progress Notes (Signed)
I,Roshena L Chambers,acting as a scribe for [name], MD.,have documented all relevant documentation on the behalf of [name], MD,as directed by  [name], MD while in the presence of [name], MD.   Established patient visit   Patient: Jared Rice   DOB: [birth year]   88 y.o. Male  MRN: [id number] Visit Date: 03/18/2022  Today's healthcare provider: [name], MD   Chief Complaint  Patient presents with   Leg Swelling   Subjective    HPI  Lower extremity swelling: Patient reports having swelling of both legs for the past 3-4 days. Swelling is located from the knee down to the ankle. Associated symptoms includes redness and tenderness during palpation anterior aspect of right ankle. . No known injuries. No dyspnea.   Medications: Outpatient Medications Prior to Visit  Medication Sig   apixaban (ELIQUIS) 5 MG TABS tablet Take 1 tablet (5 mg total) by mouth 2 (two) times daily.   clotrimazole-betamethasone (LOTRISONE) lotion Apply topically 2 (two) times daily.   diphenhydramine-acetaminophen (TYLENOL PM) 25-500 MG TABS Take 2 tablets by mouth at bedtime.   doxazosin (CARDURA) 8 MG tablet Take 1 tablet by mouth once daily   furosemide (LASIX) 40 MG tablet Take 1 tablet by mouth once daily (dose change)   Hydrocortisone-Iodoquinol (IODOQUINOL-HC EX) Apply 1 application topically daily as needed.   levothyroxine (SYNTHROID) 75 MCG tablet Take 1 tablet by mouth daily in the morning before breakfast   ramipril (ALTACE) 5 MG capsule Take 1 capsule by mouth once daily   rosuvastatin (CRESTOR) 10 MG tablet Take 1 tablet by mouth once daily   topiramate (TOPAMAX) 50 MG tablet Take 1 tablet by mouth twice daily   traMADol (ULTRAM) 50 MG tablet Take 1-2 tablets (50-100 mg total) by mouth every 6 (six) hours as needed for moderate pain.   triamcinolone cream (KENALOG) 0.1 % APPLY TO RASH ON AFFECTED AREA TWICE A DAY FOR 2 WEEKS. AVOID FACE, GROIN, AND AXILLA.  LONG TERM USE CAN CAUSE THINNING OF THE SKIN   cetirizine (ZYRTEC ALLERGY) 10 MG tablet Take 1 tablet (10 mg total) by mouth daily.   [DISCONTINUED] doxycycline (VIBRA-TABS) 100 MG tablet Take 1 tablet (100 mg total) by mouth 2 (two) times daily. (Patient not taking: Reported on 03/18/2022)   [DISCONTINUED] fluconazole (DIFLUCAN) 200 MG tablet Take 1 tablet (200 mg total) by mouth as directed. Take 1 PO on Mon, Wed and Fri for 3 doses   No facility-administered medications prior to visit.    Review of Systems  Constitutional:  Negative for appetite change, chills and fever.  Respiratory:  Negative for chest tightness, shortness of breath and wheezing.   Cardiovascular:  Positive for leg swelling. Negative for chest pain and palpitations.  Gastrointestinal:  Negative for abdominal pain, nausea and vomiting.  Skin:  Positive for color change.       Objective    BP 137/70 (BP Location: Left Arm, Patient Position: Sitting, Cuff Size: Large)   Pulse 67   Temp 97.6 F (36.4 C) (Oral)   Resp 18   Wt 262 lb (118.8 kg)   SpO2 97% Comment: room air  BMI 34.57 kg/m    Today's Vitals   03/18/22 1130 03/18/22 1134  BP: (!) 125/94 137/70  Pulse: 72 67  Resp: 18   Temp: 97.6 F (36.4 C)   TempSrc: Oral   SpO2: 97%   Weight: 262 lb (118.8 kg)    Body mass index is 34.57  kg/m.   Physical Exam   General: Appearance:    Obese male in no acute distress  Eyes:    PERRL, conjunctiva/corneas clear, EOM's intact       Lungs:     Clear to auscultation bilaterally, respirations unlabored  Heart:    Normal heart rate. Irregularly irregular rhythm. No murmurs, rubs, or gallops.    MS:   Area of tender erythema right anterior lower leg. No calf tenderness or erythema. No cords.  3+ right LE, 2+ left LE. No open wounds.   Neurologic:   Awake, alert, oriented x 3. No apparent focal neurological defect.        (Anterior right lower leg)     Assessment & Plan     1. Cellulitis of right  lower extremity  - cefdinir (OMNICEF) 300 MG capsule; Take 2 capsules (600 mg total) by mouth daily for 7 days.  Dispense: 14 capsule; Refill: 0   Call if symptoms change or if not rapidly improving.   Recheck in 1 week.      The entirety of the information documented in the History of Present Illness, Review of Systems and Physical Exam were personally obtained by me. Portions of this information were initially documented by the CMA and reviewed by me for thoroughness and accuracy.     [name], MD  [facility] [phone] (phone) [phone] (fax)  [place]

## 2022-03-20 ENCOUNTER — Ambulatory Visit: Payer: PPO

## 2022-03-20 ENCOUNTER — Ambulatory Visit (INDEPENDENT_AMBULATORY_CARE_PROVIDER_SITE_OTHER): Payer: PPO

## 2022-03-20 DIAGNOSIS — Z23 Encounter for immunization: Secondary | ICD-10-CM | POA: Diagnosis not present
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
## 2022-03-20 NOTE — Progress Notes (Deleted)
     I,Sha'taria Markeda Narvaez,acting as a Education administrator for [employer], PA-C.,have documented all relevant documentation on the behalf of Mikey Kirschner, PA-C,as directed by  Mikey Kirschner, PA-C while in the presence of Mikey Kirschner, PA-C.   Established patient visit   Patient: Jared Rice   DOB: 1932/06/29   88 y.o. Male  MRN: [id number] Visit Date: 03/21/2022  Today's healthcare provider: Mikey Kirschner, PA-C   No chief complaint on file.  Subjective    HPI  Follow up for Cellulitis of right lower extremity  The patient was last seen for this 1 weeks ago. Changes made at last visit include given cefdinir (OMNICEF) 300 MG capsule; Take 2 capsules (600 mg total) by mouth daily for 7 days.  He reports {excellent/good/fair/poor:19665} compliance with treatment. He feels that condition is {improved/worse/unchanged:3041574}. He {is/is not:21021397} having side effects. ***  -----------------------------------------------------------------------------------------   Medications: Outpatient Medications Prior to Visit  Medication Sig   apixaban (ELIQUIS) 5 MG TABS tablet Take 1 tablet (5 mg total) by mouth 2 (two) times daily.   cefdinir (OMNICEF) 300 MG capsule Take 2 capsules (600 mg total) by mouth daily for 7 days.   cetirizine (ZYRTEC ALLERGY) 10 MG tablet Take 1 tablet (10 mg total) by mouth daily.   clotrimazole-betamethasone (LOTRISONE) lotion Apply topically 2 (two) times daily.   diphenhydramine-acetaminophen (TYLENOL PM) 25-500 MG TABS Take 2 tablets by mouth at bedtime.   doxazosin (CARDURA) 8 MG tablet Take 1 tablet by mouth once daily   furosemide (LASIX) 40 MG tablet Take 1 tablet by mouth once daily (dose change)   Hydrocortisone-Iodoquinol (IODOQUINOL-HC EX) Apply 1 application topically daily as needed.   levothyroxine (SYNTHROID) 75 MCG tablet Take 1 tablet by mouth daily in the morning before breakfast   ramipril (ALTACE) 5 MG capsule Take 1 capsule by mouth once  daily   rosuvastatin (CRESTOR) 10 MG tablet Take 1 tablet by mouth once daily   topiramate (TOPAMAX) 50 MG tablet Take 1 tablet by mouth twice daily   traMADol (ULTRAM) 50 MG tablet Take 1-2 tablets (50-100 mg total) by mouth every 6 (six) hours as needed for moderate pain.   triamcinolone cream (KENALOG) 0.1 % APPLY TO RASH ON AFFECTED AREA TWICE A DAY FOR 2 WEEKS. AVOID FACE, GROIN, AND AXILLA. LONG TERM USE CAN CAUSE THINNING OF THE SKIN   No facility-administered medications prior to visit.    Review of Systems  {Labs  Heme  Chem  Endocrine  Serology  Results Review (optional):23779}   Objective    There were no vitals taken for this visit. {Show previous vital signs (optional):23777}  Physical Exam  ***  No results found for any visits on 03/21/22.  Assessment & Plan     ***  No follow-ups on file.      {provider attestation***:1}   Mikey Kirschner, PA-C  [facility] [phone] (phone) [phone] (fax)  [place]

## 2022-03-21 ENCOUNTER — Ambulatory Visit: Payer: PPO | Admitting: Physician Assistant

## 2022-03-21 ENCOUNTER — Encounter: Payer: Self-pay | Admitting: General Practice

## 2022-03-21 ENCOUNTER — Ambulatory Visit: Payer: PPO | Attending: General Practice | Admitting: Nurse Practitioner

## 2022-03-21 VITALS — BP 136/86 | HR 82 | Ht 73.0 in | Wt 260.2 lb

## 2022-03-21 DIAGNOSIS — I779 Disorder of arteries and arterioles, unspecified: Secondary | ICD-10-CM

## 2022-03-21 DIAGNOSIS — I10 Essential (primary) hypertension: Secondary | ICD-10-CM | POA: Diagnosis not present

## 2022-03-21 DIAGNOSIS — I7121 Aneurysm of the ascending aorta, without rupture: Secondary | ICD-10-CM | POA: Diagnosis not present

## 2022-03-21 DIAGNOSIS — I4819 Other persistent atrial fibrillation: Secondary | ICD-10-CM

## 2022-03-21 DIAGNOSIS — J449 Chronic obstructive pulmonary disease, unspecified: Secondary | ICD-10-CM | POA: Diagnosis not present

## 2022-03-21 DIAGNOSIS — M7989 Other specified soft tissue disorders: Secondary | ICD-10-CM

## 2022-03-21 DIAGNOSIS — R609 Edema, unspecified: Secondary | ICD-10-CM | POA: Diagnosis not present

## 2022-03-21 DIAGNOSIS — L0390 Cellulitis, unspecified: Secondary | ICD-10-CM

## 2022-03-21 DIAGNOSIS — I2510 Atherosclerotic heart disease of native coronary artery without angina pectoris: Secondary | ICD-10-CM | POA: Diagnosis not present

## 2022-03-21 NOTE — Patient Instructions (Signed)
Medication Instructions:  The current medical regimen is effective;  continue present plan and medications as directed. Please refer to the Current Medication list given to you today.  *If you need a refill on your cardiac medications before your next appointment, please call your pharmacy*  Lab Work: BMET TODAY If you have labs (blood work) drawn today and your tests are completely normal, you will receive your results only by:  [place] (if you have MyChart) OR A paper copy in the mail  If you have any lab test that is abnormal or we need to change your treatment, we will call you to review the results.  Testing/Procedures: NONE  Follow-Up: At [facility], you and your health needs are our priority.  As part of our continuing mission to provide you with exceptional heart care, we have created designated Provider Care Teams.  These Care Teams include your primary Cardiologist (physician) and Advanced Practice Providers (APPs -  Physician Assistants and Nurse Practitioners) who all work together to provide you with the care you need, when you need it.  Your next appointment:   1 month(s)  The format for your next appointment:   In Person  Provider:   Coletta Memos, FNP-C OR ANY APP   Other Instructions   Important Information About Sugar

## 2022-03-22 LAB — BASIC METABOLIC PANEL WITH GFR
BUN/Creatinine Ratio: 14 (ref 10–24)
BUN: 16 mg/dL (ref 8–27)
CO2: 27 mmol/L (ref 20–29)
Calcium: 8.8 mg/dL (ref 8.6–10.2)
Chloride: 105 mmol/L (ref 96–106)
Creatinine, Ser: 1.12 mg/dL (ref 0.76–1.27)
Glucose: 88 mg/dL (ref 70–99)
Potassium: 3.9 mmol/L (ref 3.5–5.2)
Sodium: 142 mmol/L (ref 134–144)
eGFR: 64 mL/min/1.73

## 2022-03-25 ENCOUNTER — Ambulatory Visit: Payer: PPO | Admitting: Family Medicine

## 2022-03-25 DIAGNOSIS — N1831 Chronic kidney disease, stage 3a: Secondary | ICD-10-CM | POA: Diagnosis not present

## 2022-03-25 DIAGNOSIS — R600 Localized edema: Secondary | ICD-10-CM | POA: Diagnosis not present

## 2022-03-25 DIAGNOSIS — D692 Other nonthrombocytopenic purpura: Secondary | ICD-10-CM | POA: Diagnosis not present

## 2022-03-25 DIAGNOSIS — I129 Hypertensive chronic kidney disease with stage 1 through stage 4 chronic kidney disease, or unspecified chronic kidney disease: Secondary | ICD-10-CM | POA: Diagnosis not present

## 2022-03-25 DIAGNOSIS — I4819 Other persistent atrial fibrillation: Secondary | ICD-10-CM | POA: Diagnosis not present

## 2022-03-25 DIAGNOSIS — I739 Peripheral vascular disease, unspecified: Secondary | ICD-10-CM | POA: Diagnosis not present

## 2022-03-25 DIAGNOSIS — Z872 Personal history of diseases of the skin and subcutaneous tissue: Secondary | ICD-10-CM | POA: Diagnosis not present

## 2022-03-25 DIAGNOSIS — D6869 Other thrombophilia: Secondary | ICD-10-CM | POA: Diagnosis not present

## 2022-03-25 DIAGNOSIS — I8393 Asymptomatic varicose veins of bilateral lower extremities: Secondary | ICD-10-CM | POA: Diagnosis not present

## 2022-03-25 DIAGNOSIS — Z7901 Long term (current) use of anticoagulants: Secondary | ICD-10-CM | POA: Diagnosis not present

## 2022-03-26 DIAGNOSIS — M7582 Other shoulder lesions, left shoulder: Secondary | ICD-10-CM | POA: Diagnosis not present

## 2022-03-26 DIAGNOSIS — M7542 Impingement syndrome of left shoulder: Secondary | ICD-10-CM | POA: Diagnosis not present

## 2022-03-26 DIAGNOSIS — M7522 Bicipital tendinitis, left shoulder: Secondary | ICD-10-CM | POA: Diagnosis not present

## 2022-03-27 ENCOUNTER — Ambulatory Visit: Payer: PPO

## 2022-03-28 ENCOUNTER — Telehealth: Payer: Self-pay

## 2022-03-28 NOTE — Progress Notes (Signed)
Chronic Care Management Pharmacy Assistant   Name: Jared Rice  MRN: [id number] DOB: 1933/01/08  Reason for Encounter: Hypertension Disease State Call   Recent office visits:  03/18/2022 Jared Huh, MD (PCP Office Visit) for Cellulitis R Leg- Stopped: Fluconazole 200 mg, Doxycycline Hyclate 100 mg due to patient not taking, Started: Cefdinir 600 mg daily, No orders placed, Patient to follow-up in 1 week.  Recent consult visits:  03/21/2022 Jared Bud, NP (Cardiology) for Follow-up- No medication changes noted, Lab orders placed, Patient to follow-up in 1 month.  Hospital visits:  None in previous 6 months  Medications: Outpatient Encounter Medications as of 03/28/2022  Medication Sig   apixaban (ELIQUIS) 5 MG TABS tablet Take 1 tablet (5 mg total) by mouth 2 (two) times daily.   cetirizine (ZYRTEC ALLERGY) 10 MG tablet Take 1 tablet (10 mg total) by mouth daily.   clotrimazole-betamethasone (LOTRISONE) lotion Apply topically 2 (two) times daily.   diphenhydramine-acetaminophen (TYLENOL PM) 25-500 MG TABS Take 2 tablets by mouth at bedtime.   doxazosin (CARDURA) 8 MG tablet Take 1 tablet by mouth once daily   furosemide (LASIX) 40 MG tablet Take 1 tablet by mouth once daily (dose change)   Hydrocortisone-Iodoquinol (IODOQUINOL-HC EX) Apply 1 application topically daily as needed.   levothyroxine (SYNTHROID) 75 MCG tablet Take 1 tablet by mouth daily in the morning before breakfast   ramipril (ALTACE) 5 MG capsule Take 1 capsule by mouth once daily   rosuvastatin (CRESTOR) 10 MG tablet Take 1 tablet by mouth once daily   topiramate (TOPAMAX) 50 MG tablet Take 1 tablet by mouth twice daily   traMADol (ULTRAM) 50 MG tablet Take 1-2 tablets (50-100 mg total) by mouth every 6 (six) hours as needed for moderate pain.   triamcinolone cream (KENALOG) 0.1 % APPLY TO RASH ON AFFECTED AREA TWICE A DAY FOR 2 WEEKS. AVOID FACE, GROIN, AND AXILLA. LONG TERM USE CAN CAUSE THINNING OF THE  SKIN   No facility-administered encounter medications on file as of 03/28/2022.   Care Gaps: Diabetic Foot Exam Diabetic Eye Exam Hemoglobin A1C  Star Rating Drugs: Ramipril 5 mg last filled on 12/21/2021 for a 90-Day supply with Jared Rice Rosuvastatin 10 mg last filled on 01/31/2022 for a 90-Day supply with Jared Rice  Reviewed chart prior to disease state call. Spoke with patient regarding BP  Recent Office Vitals: BP Readings from Last 3 Encounters:  03/21/22 136/86  03/18/22 137/70  01/01/22 (!) 163/77   Pulse Readings from Last 3 Encounters:  03/21/22 82  03/18/22 67  01/01/22 68    Wt Readings from Last 3 Encounters:  03/21/22 260 lb 3.2 oz (118 kg)  03/18/22 262 lb (118.8 kg)  01/01/22 246 lb (111.6 kg)    Kidney Function Lab Results  Component Value Date/Time   CREATININE 1.12 03/21/2022 04:18 PM   CREATININE 1.10 10/26/2021 11:19 AM   CREATININE 0.87 10/13/2013 10:55 AM   GFRNONAA >60 08/24/2020 12:18 PM   GFRNONAA >60 10/13/2013 10:55 AM   GFRAA 74 07/05/2020 10:31 AM   GFRAA >60 10/13/2013 10:55 AM      Latest Ref Rng & Units 03/21/2022    4:18 PM 10/26/2021   11:19 AM 08/15/2021    8:45 AM  BMP  Glucose 70 - 99 mg/dL 88  97  90   BUN 8 - 27 mg/dL '16  15  24   '$ Creatinine 0.76 - 1.27 mg/dL 1.12  1.10  1.30  BUN/Creat Ratio 10 - '24 14  14  18   '$ Sodium 134 - 144 mmol/L 142  139  144   Potassium 3.5 - 5.2 mmol/L 3.9  3.9  4.0   Chloride 96 - 106 mmol/L 105  101  108   CO2 20 - 29 mmol/L '27  26  26   '$ Calcium 8.6 - 10.2 mg/dL 8.8  8.7  8.7    Current antihypertensive regimen:  Doxazosin 8 mg nightly Furosemide 40 mg daily Ramipril 5 mg daily   Adherence Review: Is the patient currently on ACE/ARB medication? Yes Does the patient have >5 day gap between last estimated fill dates? No  I have attempted 3 separate times to contact the patient to complete his monthly call. I have left HIPAA compliant voicemails requesting a return  call from the patient. My attempts have been unsuccessful at this time.   Patient has a follow-up telephone appointment with Jared Rice, CPP on 05/06/2022 @ 1500.  Jared Rice, CPA/CMA Clinical Pharmacist Assistant Phone: [phone]   11/02 LVM requesting patient to return my call 11/06 LVM requesting patient to return my call

## 2022-04-04 ENCOUNTER — Other Ambulatory Visit: Payer: Self-pay | Admitting: Cardiology

## 2022-04-05 ENCOUNTER — Ambulatory Visit: Payer: PPO | Admitting: Physician Assistant

## 2022-04-05 ENCOUNTER — Emergency Department
Admission: EM | Admit: 2022-04-05 | Discharge: 2022-04-05 | Disposition: A | Discharge status: Home / Self Care | Payer: PPO | Attending: Student in an Organized Health Care Education/Training Program | Admitting: Student in an Organized Health Care Education/Training Program

## 2022-04-05 ENCOUNTER — Other Ambulatory Visit: Payer: Self-pay

## 2022-04-05 DIAGNOSIS — Z7901 Long term (current) use of anticoagulants: Secondary | ICD-10-CM | POA: Diagnosis not present

## 2022-04-05 DIAGNOSIS — S81812A Laceration without foreign body, left lower leg, initial encounter: Secondary | ICD-10-CM | POA: Insufficient documentation

## 2022-04-05 DIAGNOSIS — W268XXA Contact with other sharp object(s), not elsewhere classified, initial encounter: Secondary | ICD-10-CM | POA: Insufficient documentation

## 2022-04-05 DIAGNOSIS — Y92015 Private garage of single-family (private) house as the place of occurrence of the external cause: Secondary | ICD-10-CM | POA: Insufficient documentation

## 2022-04-05 DIAGNOSIS — S8992XA Unspecified injury of left lower leg, initial encounter: Secondary | ICD-10-CM | POA: Diagnosis present

## 2022-04-05 MED ORDER — CEPHALEXIN 500 MG PO CAPS: 1000.0000 mg | ORAL_CAPSULE | Freq: Two times a day (BID) | ORAL | 0 refills | Status: DC

## 2022-04-05 NOTE — Telephone Encounter (Signed)
Prescription refill request for Eliquis received. Indication:afib Last office visit:10/23 Scr:1.1 Age: 88 Weight:118 kg  Prescription refilled

## 2022-04-05 NOTE — ED Triage Notes (Signed)
Pt reports that he hit his left lower leg on a box in his garage, pta and has an injury that is bleeding, pt states that he is on eliquis

## 2022-04-05 NOTE — ED Provider Notes (Signed)
[facility] Provider Note  Patient Contact: 4:15 PM (approximate)   History   Laceration   HPI  Jared Rice is a 88 y.o. male who presents the emergency department complaining of a laceration to the left leg.  Patient states that he was working in his garage when he accidentally made contact with the corner of a box with the left leg.  Patient states his skin is "very thin" and caused a relatively small laceration but he is having difficulty controlling the bleeding as he is on Eliquis.  Patient states that at this time it is just a small amount of oozing bleeding.  No other injury or complaint.  Last tetanus shot was 2 years ago.     Physical Exam   Triage Vital Signs: ED Triage Vitals  Enc Vitals Group     BP 04/05/22 1611 (!) 179/98     Pulse Rate 04/05/22 1611 72     Resp 04/05/22 1611 18     Temp 04/05/22 1611 97.8 F (36.6 C)     Temp Source 04/05/22 1611 Oral     SpO2 04/05/22 1611 93 %     Weight 04/05/22 1603 260 lb (117.9 kg)     Height 04/05/22 1603 '6\' 1"'$  (1.854 m)     Head Circumference --      Peak Flow --      Pain Score 04/05/22 1602 0     Pain Loc --      Pain Edu? --      Excl. in [place]? --     Most recent vital signs: Vitals:   04/05/22 1611 04/05/22 1630  BP: (!) 179/98 (!) 156/83  Pulse: 72 67  Resp: 18   Temp: 97.8 F (36.6 C)   SpO2: 93% 95%     General: Alert and in no acute distress.  Cardiovascular:  Good peripheral perfusion Respiratory: Normal respiratory effort without tachypnea or retractions. Lungs CTAB. Musculoskeletal: Full range of motion to all extremities.  Visualization of the left lower extremity reveals a relatively superficial laceration that measures approximately 2 cm in length.  This is crescent-shaped.  There is missing epidermal tissue in this laceration.  Appears to be more of a deep skin tear than true laceration.  There is a small low amount of oozing bleeding that is controlled with any  direct pressure. Neurologic:  No gross focal neurologic deficits are appreciated.  Skin:   No rash noted Other:   ED Results / Procedures / Treatments   Labs (all labs ordered are listed, but only abnormal results are displayed) Labs Reviewed - No data to display   EKG     RADIOLOGY    No results found.  PROCEDURES:  Critical Care performed: No  Procedures   MEDICATIONS ORDERED IN ED: Medications - No data to display   IMPRESSION / MDM / [place] / ED COURSE  I reviewed the triage vital signs and the nursing notes.                              Differential diagnosis includes, but is not limited to, laceration, skin tear, bleeding varicose vein  Patient's presentation is most consistent with acute presentation with potential threat to life or bodily function.   Patient's diagnosis is consistent with leg laceration.  Patient presents emergency department for laceration of the left leg.  Patient has a relatively superficial laceration but  had ongoing bleeding due to Eliquis use.  This was easily controlled with direct pressure as there was only a very minimal amount of slow ooze of bleeding.  Patient had missing epidermal tissue and was more of a skin tear.  As such area was dressed using Surgicel dressing with overlying gauze.  Wound care instructions discussed with the patient.  Antibiotics prophylactically..  Follow-up with primary care as needed.  Patient is given ED precautions to return to the ED for any worsening or new symptoms.        FINAL CLINICAL IMPRESSION(S) / ED DIAGNOSES   Final diagnoses:  Laceration of left lower extremity, initial encounter     Rx / DC Orders   ED Discharge Orders          Ordered    cephALEXin (KEFLEX) 500 MG capsule  2 times daily        04/05/22 1710             Note:  This document was prepared using Dragon voice recognition software and may include unintentional dictation errors.   Darletta Moll, PA-C 04/05/22 1712    Merlyn Lot, MD 04/05/22 1747

## 2022-04-08 ENCOUNTER — Telehealth: Payer: Self-pay

## 2022-04-08 DIAGNOSIS — S81812D Laceration without foreign body, left lower leg, subsequent encounter: Secondary | ICD-10-CM | POA: Diagnosis not present

## 2022-04-08 DIAGNOSIS — I4891 Unspecified atrial fibrillation: Secondary | ICD-10-CM | POA: Diagnosis not present

## 2022-04-08 DIAGNOSIS — S40811D Abrasion of right upper arm, subsequent encounter: Secondary | ICD-10-CM | POA: Diagnosis not present

## 2022-04-08 DIAGNOSIS — S41111D Laceration without foreign body of right upper arm, subsequent encounter: Secondary | ICD-10-CM | POA: Diagnosis not present

## 2022-04-08 DIAGNOSIS — Z7901 Long term (current) use of anticoagulants: Secondary | ICD-10-CM | POA: Diagnosis not present

## 2022-04-08 MED ORDER — HYDROCORTISONE 2.5 % EX CREA: TOPICAL_CREAM | Freq: Two times a day (BID) | CUTANEOUS | 3 refills | Status: DC | PRN

## 2022-04-08 NOTE — Telephone Encounter (Signed)
Pt called requesting a refill of Intertrigo [facility] skin cream, pt request to 60 mg tube, ,okay refill sent to [facility]

## 2022-04-08 NOTE — Telephone Encounter (Signed)
Copied from [place] [id number]. Topic: General - Other >> Apr 08, 2022  2:39 PM Ja-Kwan M wrote: Reason for CRM: Jared Rice with [facility] reports that pt was seen today and topical ointment was prescribed for the wounds. Cb# [phone]

## 2022-04-08 NOTE — Telephone Encounter (Signed)
Acknowledged.   Eulis Foster, MD  [facility]  [phone]

## 2022-04-09 ENCOUNTER — Other Ambulatory Visit: Payer: Self-pay

## 2022-04-09 DIAGNOSIS — E039 Hypothyroidism, unspecified: Secondary | ICD-10-CM

## 2022-04-09 MED ORDER — LEVOTHYROXINE SODIUM 75 MCG PO TABS: ORAL_TABLET | ORAL | 0 refills | Status: DC

## 2022-04-09 MED ORDER — FUROSEMIDE 40 MG PO TABS: ORAL_TABLET | ORAL | 1 refills | Status: DC

## 2022-04-10 ENCOUNTER — Other Ambulatory Visit: Payer: Self-pay

## 2022-04-10 DIAGNOSIS — Z79899 Other long term (current) drug therapy: Secondary | ICD-10-CM

## 2022-04-10 MED ORDER — POTASSIUM CHLORIDE CRYS ER 20 MEQ PO TBCR: EXTENDED_RELEASE_TABLET | ORAL | 0 refills | Status: DC

## 2022-04-11 DIAGNOSIS — S51811D Laceration without foreign body of right forearm, subsequent encounter: Secondary | ICD-10-CM | POA: Diagnosis not present

## 2022-04-11 DIAGNOSIS — S81812D Laceration without foreign body, left lower leg, subsequent encounter: Secondary | ICD-10-CM | POA: Diagnosis not present

## 2022-04-24 NOTE — Progress Notes (Deleted)
Cardiology Office Note:    Date:  04/24/2022   ID:  Jared Rice, DOB 23-Apr-1933, MRN [id number]  PCP:  Eulis Foster, MD  Cardiologist:  Peter Martinique, MD  Electrophysiologist:  None   Referring MD: Mikey Kirschner, PA-C   Chief Complaint: follow-up of lower extremity swelling  History of Present Illness:    Jared Rice is a 88 y.o. male with a history of non-obstructive CAD noted on cardiac catheterization in 2005, persistent atrial fibrillation on Eliquis, LBBB, mild bilateral carotid stenosis, hypertension, hyperlipidemia, type 2 diabetes mellitus, chronic lower extremity edema, hypothyroidism, GERD, COPD,  and obstructive sleep apnea who is followed by Dr. Martinique and presents today for follow-up of lower extremity edema.   Patient has had several cardiac catheterization over there years all revealing non-obstructive disease. Last cardiac catheterization I can see is from 2005 and showed mild non-obstructive disease with only 20-30% stenosis of the LAD, 1st Diag, and PDA. Last ischemic evaluation was a Myoview in 05/2010 which was normal. Last Echo in 12/2011 showed LVEF of 55-60% with mild LVH and grade 1 diastolic dysfunction as well as abnormal septal motion and dyssynergy consistent with LBBB. He has chronic lower extremity edema and has had multiple lower extremity dopplers in the past screening for DVT which have been negative (last doppler was in 02/2019). He also has known bilateral carotid stenosis. Last carotid dopplers in 2016 showed  1-49% stenosis of bilateral ICAs was stable from prior imaging in 2013.   Patient was last seen by Karen Chafe, NP, on 03/21/2022 at which time he had recently been diagnosed with cellulitis of his right leg and was being treated with antibiotics. He has chronic lower extremity edema but edema was significantly worse in this leg. Otherwise, he was doing well from a cardiac standpoint. He denied missing any doses of his Eliquis. His  Lasix was increased to '40mg'$  twice daily for 5 days and then he was instructed to go back to once daily dosing. Plan was for follow-up in 1 month with low threshold for lower extremity dopplers to rule out DVT if no improvement in edema.   Non-Obstructive CAD Chronic LBBB Noted on several cardiac catheterizations over the years (last one in 2005). Reportedly had a Myoview in 2012 which was normal (I am unable to see this report but listed in prior cardiac notes). - No chest pain.  - No aspirin due to need for Eliquis.  - Continue statin.  Persistent Atrial Fibrillation Rate controlled. *** - Not on any AV nodal agents.  - Continue chronic anticoagulation with Eliquis '5mg'$  twice daily.  Chronic Lower Extremity Edema Patient has chronic lower extremity edema. This was noted to be worse on the right leg at visit last month after he had recently been diagnosed with cellulitis. Lasix was increased for 5 days. - *** - Continue Lasix '40mg'$  daily.  Carotid Stenosis Last carotid ultrasound in 2016 showed 1-49% stenosis of bilateral ICAs which was stable from prior imaging in 2013. - No aspirin due to need for Eliquis.  - Continue statin. - ***  Hypertension BP well controlled. - Continue Ramipril '5mg'$  daily.  - Also on Cardura '8mg'$  daily.   Hyperlipidemia Lipid panel in 07/2021: Total Cholesterol 107, Triglycerides 35, HDL 42, LDL 55.  - Continue Crestor '10mg'$  daily.  Type 2 Diabetes Mellitus Hemoglobin A1c 5.8 in 07/2021.  - Management per PCP.  Past Medical History:  Diagnosis Date   Actinic keratosis    Anginal pain ([place])  Aortic atherosclerosis (HCC)    Arthritis    Atrial fibrillation ([place])    Basal cell carcinoma 02/06/2010   Left shoulder. Superficial.   Basal cell carcinoma 10/11/2013   Right medial forearm. Superficial   Basal cell carcinoma 04/10/2015   Right inf. lat. thigh. Superficial   Basal cell carcinoma 12/09/2016   Right cheek. Superficial.   Chronic airway  obstruction, not elsewhere classified    Colon polyps    Complication of anesthesia    SPINAL DID NOT WORK FOR LAST KNEE REPLACEMENT AND HAD TO HAVE GA   Coronary atherosclerosis of native coronary artery    nonobstructive   Diabetes mellitus without complication (HCC)    Essential hypertension, benign    GERD (gastroesophageal reflux disease)    H/O   Glucose intolerance (impaired glucose tolerance)    Heart murmur    History of kidney stones    Hyperlipidemia    Hypothyroidism    Iron deficiency anemia    Knee pain, right    LBBB (left bundle branch block)    Occlusion and stenosis of carotid artery without mention of cerebral infarction    wears compression stockings   Other dyspnea and respiratory abnormality    w/ pseuodowheeze resolves with purse lip manuever   Precordial pain    Shoulder pain, right    Squamous cell carcinoma of skin 10/06/2007   Right forearm. SCCis   Squamous cell carcinoma of skin 10/11/2013   Left mid lat. pretibial. KA-like pattern.   Tremor    Unspecified sleep apnea    HAS NOT USED CPAP IN 20 YEARS   Venous insufficiency    Wears dentures    full upper    Past Surgical History:  Procedure Laterality Date   CARDIAC CATHETERIZATION     X2   COLONOSCOPY  03/17/12   Dr Byrnett-diverticulosis   ESOPHAGOGASTRODUODENOSCOPY (EGD) WITH PROPOFOL N/A 11/25/2014   Procedure: ESOPHAGOGASTRODUODENOSCOPY (EGD) WITH PROPOFOL;  Surgeon: Lucilla Lame, MD;  Location: [place];  Service: Endoscopy;  Laterality: N/A;  with biopsy   ESOPHAGOGASTRODUODENOSCOPY (EGD) WITH PROPOFOL N/A 01/05/2015   Procedure: ESOPHAGOGASTRODUODENOSCOPY (EGD) WITH PROPOFOL;  Surgeon: Lucilla Lame, MD;  Location: [place];  Service: Endoscopy;  Laterality: N/A;   EYE SURGERY Bilateral    cataract    KNEE ARTHROPLASTY Right 09/06/2020   Procedure: COMPUTER ASSISTED TOTAL KNEE ARTHROPLASTY;  Surgeon: Dereck Leep, MD;  Location: [facility];  Service: Orthopedics;   Laterality: Right;   TOTAL KNEE ARTHROPLASTY Left 2012    Current Medications: No outpatient medications have been marked as taking for the 04/29/22 encounter (Appointment) with Darreld Mclean, PA-C.     Allergies:   Dexilant [dexlansoprazole] and Primidone   Social History   Socioeconomic History   Marital status: Married    Spouse name: Not on file   Number of children: 2   Years of education: Not on file   Highest education level: Bachelor's degree (e.g., BA, AB, BS)  Occupational History   Occupation: retired    Comment: [employer]  Tobacco Use   Smoking status: Former    Packs/day: 2.00    Years: 31.00    Total pack years: 62.00    Types: Cigarettes    Quit date: 12/24/1980    Years since quitting: 41.3   Smokeless tobacco: Never   Tobacco comments:    quit in 1982  Vaping Use   Vaping Use: Never used  Substance and Sexual Activity   Alcohol  use: No    Alcohol/week: 0.0 standard drinks of alcohol   Drug use: No   Sexual activity: Not on file  Other Topics Concern   Not on file  Social History Narrative   Lives with wife, retired Engineer, drilling, 2 children, 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   Social Determinants of Health   Financial Resource Strain: [place]  (06/05/2021)   Overall Financial Resource Strain (CARDIA)    Difficulty of Paying Living Expenses: Not hard at all  Food Insecurity: No Food Insecurity (06/05/2021)   Hunger Vital Sign    Worried About Running Out of Food in the Last Year: Never true    Ran Out of Food in the Last Year: Never true  Transportation Needs: No Transportation Needs (06/05/2021)   PRAPARE - Hydrologist (Medical): No    Lack of Transportation (Non-Medical): No  Physical Activity: Inactive (06/05/2021)   Exercise Vital Sign    Days of Exercise per Week: 0 days    Minutes of Exercise per Session: 0 min  Stress: No Stress Concern Present (06/05/2021)   [place]    Feeling of Stress : Not at all  Social Connections: Moderately Integrated (06/05/2021)   Social Connection and Isolation Panel [NHANES]    Frequency of Communication with Friends and Family: More than three times a week    Frequency of Social Gatherings with Friends and Family: More than three times a week    Attends Religious Services: Never    Marine scientist or Organizations: Yes    Attends Music therapist: More than 4 times per year    Marital Status: Married     Family History: The patient's family history includes Alcohol abuse in his father; Alzheimer's disease (age of onset: 31) in his brother; Alzheimer's disease (age of onset: 48) in his sister; Heart attack (age of onset: 65) in his father; Heart failure (age of onset: 15) in his mother. There is no history of Colon cancer or Liver disease.  ROS:   Please see the history of present illness.     EKGs/Labs/Other Studies Reviewed:    The following studies were reviewed today:  Echocardiogram 01/03/2012: Study Conclusions: - Left ventricle: The cavity size was mildly dilated. Wall    thickness was increased in a pattern of mild LVH. There    was mild focal basal hypertrophy of the septum. Systolic    function was normal. The estimated ejection fraction was    in the range of 55% to 60%. Wall motion was normal; there    were no regional wall motion abnormalities. Doppler    parameters are consistent with abnormal left ventricular    relaxation (grade 1 diastolic dysfunction).  - Ventricular septum: Septal motion showed abnormal function    and dyssynergy. These changes are consistent with a left    bundle branch block.  - Left atrium: The atrium was moderately dilated.  - Right atrium: The atrium was mildly dilated.  - Pulmonary arteries: Systolic pressure was mildly    increased. PA peak pressure: 24m Hg (S).    EKG:  EKG not ordered today.   Recent  Labs: 07/19/2021: NT-Pro BNP 1,863 10/26/2021: ALT 7; Hemoglobin 11.7; Platelets 179; TSH 5.360 03/21/2022: BUN 16; Creatinine, Ser 1.12; Potassium 3.9; Sodium 142  Recent Lipid Panel    Component Value Date/Time   CHOL 107 08/15/2021 0842   TRIG 35 08/15/2021 0842  HDL 42 08/15/2021 0842   CHOLHDL 2.5 08/15/2021 0842   LDLCALC 55 08/15/2021 0842    Physical Exam:    Vital Signs: There were no vitals taken for this visit.    Wt Readings from Last 3 Encounters:  04/05/22 260 lb (117.9 kg)  03/21/22 260 lb 3.2 oz (118 kg)  03/18/22 262 lb (118.8 kg)     General: 88 y.o. male in no acute distress. HEENT: Normocephalic and atraumatic. Sclera clear. EOMs intact. Neck: Supple. No carotid bruits. No JVD. Heart: *** RRR. Distinct S1 and S2. No murmurs, gallops, or rubs. Radial and distal pedal pulses 2+ and equal bilaterally. Lungs: No increased work of breathing. Clear to ausculation bilaterally. No wheezes, rhonchi, or rales.  Abdomen: Soft, non-distended, and non-tender to palpation. Bowel sounds present in all 4 quadrants.  MSK: Normal strength and tone for age. *** Extremities: No lower extremity edema.    Skin: Warm and dry. Neuro: Alert and oriented x3. No focal deficits. Psych: Normal affect. Responds appropriately.   Assessment:    No diagnosis found.  Plan:     Disposition: Follow up in ***   Medication Adjustments/Labs and Tests Ordered: Current medicines are reviewed at length with the patient today.  Concerns regarding medicines are outlined above.  No orders of the defined types were placed in this encounter.  No orders of the defined types were placed in this encounter.   There are no Patient Instructions on file for this visit.   Signed, Darreld Mclean, PA-C  04/24/2022 11:52 AM    [facility]

## 2022-04-26 ENCOUNTER — Other Ambulatory Visit: Payer: Self-pay

## 2022-04-26 DIAGNOSIS — Z79899 Other long term (current) drug therapy: Secondary | ICD-10-CM | POA: Diagnosis not present

## 2022-04-27 LAB — BASIC METABOLIC PANEL
BUN/Creatinine Ratio: 14 (ref 10–24)
BUN: 18 mg/dL (ref 8–27)
CO2: 28 mmol/L (ref 20–29)
Calcium: 9.1 mg/dL (ref 8.6–10.2)
Chloride: 102 mmol/L (ref 96–106)
Creatinine, Ser: 1.27 mg/dL (ref 0.76–1.27)
Glucose: 99 mg/dL (ref 70–99)
Potassium: 4.3 mmol/L (ref 3.5–5.2)
Sodium: 144 mmol/L (ref 134–144)
eGFR: 55 mL/min/{1.73_m2} — ABNORMAL LOW (ref >59)

## 2022-04-29 ENCOUNTER — Ambulatory Visit: Payer: PPO | Admitting: Student

## 2022-05-06 ENCOUNTER — Ambulatory Visit (INDEPENDENT_AMBULATORY_CARE_PROVIDER_SITE_OTHER): Payer: PPO

## 2022-05-06 DIAGNOSIS — I10 Essential (primary) hypertension: Secondary | ICD-10-CM

## 2022-05-06 DIAGNOSIS — I4891 Unspecified atrial fibrillation: Secondary | ICD-10-CM

## 2022-05-06 NOTE — Progress Notes (Unsigned)
Chronic Care Management Pharmacy Note  05/07/2022 Name:  Jared Rice MRN:  [id number] DOB:  May 14, 1933  Summary: Patient presents for CCM follow-up.   Recommendations/Changes made from today's visit: -Recommended to continue current medication   Plan: CPP follow-up in 6 months   Subjective: Jared Rice is an 88 y.o. year old male who is a primary patient of Simmons-Robinson, Riki Sheer, MD.  The CCM team was consulted for assistance with disease management and care coordination needs.    Engaged with patient by telephone for follow up visit in response to provider referral for pharmacy case management and/or care coordination services.   Consent to Services:  The patient was given information about Chronic Care Management services, agreed to services, and gave verbal consent prior to initiation of services.  Please see initial visit note for detailed documentation.   Patient Care Team: Eulis Foster, MD as PCP - General (Family Medicine) Martinique, Peter M, MD as PCP - Cardiology (Cardiology) Estill [name], MD as Consulting Physician (Ophthalmology) Ralene Bathe, MD (Dermatology) Marry Guan Laurice Record, MD (Orthopedic Surgery) Caroline More, DPM as Consulting Physician (Podiatry) Germaine Pomfret, [facility] as Pharmacist (Pharmacist)  Recent office visits: 03/18/22: Patient presented to Dr. Caryn Section for cellulitis. Cefdinir.  01/01/22: Patient presented to Dr. Caryn Section for dermatitis. .   Recent consult visits: 03/21/22: Patient presented to [name], NP (Cardiology).  10/08/21: Patient presented to Caroline More, DPM ([place])   Hospital visits: 04/05/22: Patient presented to ED.    Objective:  Lab Results  Component Value Date   CREATININE 1.27 04/26/2022   BUN 18 04/26/2022   GFRNONAA >60 08/24/2020   GFRAA 74 07/05/2020   NA 144 04/26/2022   K 4.3 04/26/2022   CALCIUM 9.1 04/26/2022   CO2 28 04/26/2022   GLUCOSE 99 04/26/2022    Lab  Results  Component Value Date/Time   HGBA1C 5.8 (H) 08/15/2021 08:42 AM   HGBA1C 5.9 (H) 07/05/2020 10:31 AM    Last diabetic Eye exam:  Lab Results  Component Value Date/Time   HMDIABEYEEXA No Retinopathy 04/27/2019 12:00 AM    Last diabetic Foot exam: No results found for: "HMDIABFOOTEX"   Lab Results  Component Value Date   CHOL 107 08/15/2021   HDL 42 08/15/2021   LDLCALC 55 08/15/2021   TRIG 35 08/15/2021   CHOLHDL 2.5 08/15/2021       Latest Ref Rng & Units 10/26/2021   11:19 AM 08/15/2021    8:45 AM 07/19/2021    3:56 PM  Hepatic Function  Total Protein 6.0 - 8.5 g/dL 6.0  5.9  6.1   Albumin 3.6 - 4.6 g/dL 4.1  3.9  4.1   AST 0 - 40 IU/L _0 ALT 0 - 44 IU/L _1 Alk Phosphatase 44 - 121 IU/L 64  63  74   Total Bilirubin 0.0 - 1.2 mg/dL 0.6  0.3  0.7     Lab Results  Component Value Date/Time   TSH 5.360 (H) 10/26/2021 11:19 AM   TSH 8.240 (H) 07/19/2021 03:56 PM   FREET4 1.20 10/26/2021 11:19 AM       Latest Ref Rng & Units 10/26/2021   11:19 AM 07/19/2021    3:56 PM 02/07/2021   10:37 AM  CBC  WBC 3.4 - 10.8 x10E3/uL 6.5  6.8  4.4   Hemoglobin 13.0 - 17.7 g/dL 11.7  11.3  10.5   Hematocrit 37.5 - 51.0 %  36.1  34.6  31.5   Platelets 150 - 450 x10E3/uL 179  162  170     No results found for: "VD25OH"  Clinical ASCVD: No  The ASCVD Risk score (Arnett DK, et al., 2019) failed to calculate for the following reasons:   The 2019 ASCVD risk score is only valid for ages 10 to 40       06/05/2021    3:08 PM 05/31/2020    3:00 PM 03/20/2020   12:55 PM  Depression screen PHQ 2/9  Decreased Interest 0 0 0  Down, Depressed, Hopeless 0 0 0  PHQ - 2 Score 0 0 0    Social History   Tobacco Use  Smoking Status Former   Packs/day: 2.00   Years: 31.00   Total pack years: 62.00   Types: Cigarettes   Quit date: 12/24/1980   Years since quitting: 41.3  Smokeless Tobacco Never  Tobacco Comments   quit in 1982   BP Readings from Last 3 Encounters:   04/05/22 (!) 156/83  03/21/22 136/86  03/18/22 137/70   Pulse Readings from Last 3 Encounters:  04/05/22 67  03/21/22 82  03/18/22 67   Wt Readings from Last 3 Encounters:  04/05/22 260 lb (117.9 kg)  03/21/22 260 lb 3.2 oz (118 kg)  03/18/22 262 lb (118.8 kg)   BMI Readings from Last 3 Encounters:  04/05/22 34.30 kg/m  03/21/22 34.33 kg/m  03/18/22 34.57 kg/m    Assessment/Interventions: Review of patient past medical history, allergies, medications, health status, including review of consultants reports, laboratory and other test data, was performed as part of comprehensive evaluation and provision of chronic care management services.   SDOH:  (Social Determinants of Health) assessments and interventions performed: Yes SDOH Interventions    Flowsheet Row Clinical Support from 06/05/2021 in [place] Management from 12/08/2020 in [place] Management from 06/09/2020 in [place] from 05/31/2020 in [place] Interventions Intervention Not Indicated -- -- --  Housing Interventions Intervention Not Indicated -- -- --  Transportation Interventions Intervention Not Indicated -- Intervention Not Indicated --  Financial Strain Interventions Intervention Not Indicated Intervention Not Indicated Intervention Not Indicated --  Physical Activity Interventions Patient Refused -- -- Patient Refused  Stress Interventions Intervention Not Indicated -- -- --  Social Connections Interventions Intervention Not Indicated -- -- --       SDOH Screenings   Food Insecurity: No Food Insecurity (06/05/2021)  Housing: Low Risk  (06/05/2021)  Transportation Needs: No Transportation Needs (06/05/2021)  Alcohol Screen: Low Risk  (06/05/2021)  Depression (PHQ2-9): Low Risk  (06/05/2021)  Financial Resource Strain: Low Risk  (06/05/2021)  Physical Activity:  Inactive (06/05/2021)  Social Connections: Moderately Integrated (06/05/2021)  Stress: No Stress Concern Present (06/05/2021)  Tobacco Use: Medium Risk (03/21/2022)    CCM Care Plan  Allergies  Allergen Reactions   Dexilant [Dexlansoprazole] Rash   Primidone Itching and Rash    Medications Reviewed Today     Reviewed by Hinton Dyer, CMA (Certified Medical Assistant) on 03/21/22 at 1502  Med List Status: <None>   Medication Order Taking? Sig Documenting Provider Last Dose Status Informant  apixaban (ELIQUIS) 5 MG TABS tablet [id number] Yes Take 1 tablet (5 mg total) by mouth 2 (two) times daily. Martinique, Peter M, MD Taking Active   cefdinir (OMNICEF) 300 MG capsule [id number] Yes Take 2 capsules (600 mg  total) by mouth daily for 7 days. Birdie Sons, MD Taking Active   cetirizine (ZYRTEC ALLERGY) 10 MG tablet [id number]  Take 1 tablet (10 mg total) by mouth daily. Birdie Sons, MD  Expired 01/31/22 2359   clotrimazole-betamethasone (LOTRISONE) lotion [id number] Yes Apply topically 2 (two) times daily. Birdie Sons, MD Taking Active   diphenhydramine-acetaminophen (TYLENOL PM) 25-500 MG TABS [id number] Yes Take 2 tablets by mouth at bedtime. [provider] Taking Active Self  doxazosin (CARDURA) 8 MG tablet [id number] Yes Take 1 tablet by mouth once daily Drubel, Ria Comment, PA-C Taking Active   furosemide (LASIX) 40 MG tablet [id number] Yes Take 1 tablet by mouth once daily (dose change) Jerrol Banana., MD Taking Active   Hydrocortisone-Iodoquinol [facility]) [id number] Yes Apply 1 application topically daily as needed. [provider] Taking Active   levothyroxine (SYNTHROID) 75 MCG tablet [id number] Yes Take 1 tablet by mouth daily in the morning before breakfast Jerrol Banana., MD Taking Active   ramipril (ALTACE) 5 MG capsule [id number] Yes Take 1 capsule by mouth once daily Jerrol Banana., MD Taking Active   rosuvastatin (CRESTOR)  10 MG tablet [id number] Yes Take 1 tablet by mouth once daily Jerrol Banana., MD Taking Active   topiramate (TOPAMAX) 50 MG tablet [id number] Yes Take 1 tablet by mouth twice daily Fisher, Kirstie Peri, MD Taking Active   traMADol (ULTRAM) 50 MG tablet [id number] Yes Take 1-2 tablets (50-100 mg total) by mouth every 6 (six) hours as needed for moderate pain. Lattie Corns, PA-C Taking Active   triamcinolone cream (KENALOG) 0.1 % [id number] Yes APPLY TO RASH ON AFFECTED AREA TWICE A DAY FOR 2 WEEKS. AVOID FACE, GROIN, AND AXILLA. LONG TERM USE CAN CAUSE THINNING OF THE SKIN Ralene Bathe, MD Taking Active             Patient Active Problem List   Diagnosis Date Noted   Diarrhea 10/26/2021   Status post total right knee replacement 10/19/2020   Total knee replacement status 09/06/2020   Atrial fibrillation ([place]) 05/12/2020   Urinary frequency 11/22/2019   Leukocytes in urine 11/22/2019   Skin tear of forearm without complication, initial encounter- left 11/22/2019   Wound cellulitis- right lower leg anterior  10/29/2019   Leg wound, right, sequela 10/29/2019   Chronic fatigue 11/29/2015   Benign prostatic hyperplasia 02/22/2015   Gastrointestinal ulcer    History of peptic ulcer    Hematochezia    Acute gastric ulcer    Melena 11/12/2014   Malignant neoplasm of skin 09/21/2014   Cataract 09/21/2014   Colon polyp 09/21/2014   CAFL (chronic airflow limitation) ([place]) 09/21/2014   Diabetes ([place]) 09/21/2014   DD (diverticular disease) 09/21/2014   Benign essential tremor 09/21/2014   Personal history of tobacco use, presenting hazards to health 09/21/2014   Acid reflux 09/21/2014   Hemorrhoids 09/21/2014   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   HLD (hyperlipidemia) 09/21/2014   BP (high blood pressure) 09/21/2014   Hypothyroidism 09/21/2014   Lymphoma ([place]) 09/21/2014   Malaise and fatigue 09/21/2014   Mononeuritis 09/21/2014   Muscle ache 09/21/2014   Neuropathy  09/21/2014   Numbness and tingling 09/21/2014   Arthritis, degenerative 09/21/2014   Awareness of heartbeats 09/21/2014   Borderline diabetes 09/21/2014   Rotator cuff syndrome 09/21/2014   Has a tremor 09/21/2014   Stasis, venous 09/21/2014   Avitaminosis D 09/21/2014   Arthritis of knee, degenerative 11/16/2013  Bradycardia, sinus 12/31/2011   Left bundle branch block 12/31/2011   First degree AV block 12/31/2011   Edema of both legs 12/31/2011   Bradycardia 12/31/2011   Localized edema 12/31/2011   COPD UNSPECIFIED 11/09/2009   HYPERTENSION, BENIGN 10/03/2009   SLEEP APNEA 10/03/2009   DYSPNEA 10/03/2009   Other forms of dyspnea 10/03/2009   CAD, NATIVE VESSEL 05/04/2009   Carotid disease, bilateral ([place]) 05/04/2009   Disorder of artery or arteriole ([place]) 05/04/2009    Immunization History  Administered Date(s) Administered   Fluad Quad(high Dose 65+) 02/24/2019, 02/14/2020, 03/28/2021, 03/20/2022   Influenza Split 02/20/2011, 02/27/2012   Influenza, High Dose Seasonal PF 02/22/2014, 02/18/2015, 02/29/2016, 02/26/2017, 04/22/2018   Influenza,inj,Quad PF,6+ Mos 02/13/2013   Influenza-Unspecified 02/24/2013   PFIZER(Purple Top)SARS-COV-2 Vaccination 06/17/2019, 07/08/2019, 04/26/2020   Pneumococcal Conjugate-13 04/14/2014   Pneumococcal Polysaccharide-23 03/25/1997, 04/05/2003   Td 08/29/2003   Tdap 02/20/2011, 02/14/2020   Zoster Recombinat (Shingrix) 02/26/2017, 06/25/2017   Zoster, Live 05/12/2007    Conditions to be addressed/monitored:  Hypertension, Hyperlipidemia, Diabetes, Atrial Fibrillation, Coronary Artery Disease, COPD, Hypothyroidism, and BPH  Care Plan : General Pharmacy (Adult)  Updates made by Germaine Pomfret, RPH since 05/07/2022 12:00 AM     Problem: Hypertension, Hyperlipidemia, Diabetes, Atrial Fibrillation, Coronary Artery Disease, COPD, Hypothyroidism, and BPH   Priority: High     Long-Range Goal: Patient-Specific Goal   Start Date:  12/11/2020  Expected End Date: 05/08/2023  This Visit's Progress: On track  Recent Progress: On track  Priority: High  Note:   Current Barriers:  No barriers noted   Pharmacist Clinical Goal(s):  Patient will maintain control of blood pressure as evidenced by BP less than 140/90  through collaboration with PharmD and provider.   Interventions: 1:1 collaboration with Jerrol Banana., MD regarding development and update of comprehensive plan of care as evidenced by provider attestation and co-signature Inter-disciplinary care team collaboration (see longitudinal plan of care) Comprehensive medication review performed; medication list updated in electronic medical record  Hypertension (BP goal <140/90) -Controlled -Current treatment: Doxazosin 8 mg nightly: Appropriate, Effective, Safe, Accessible Furosemide 40 mg daily: Appropriate, Effective, Query safe Ramipril 5 mg daily: Appropriate, Effective, Safe, Accessible  -Medications previously tried: NA  -Current home readings: 40/98, 119J systolics  -Denies hypotension symptoms. -Current dietary habits: low sodium diet -Current exercise habits: NA -Continue current medications  Atrial Fibrillation (Goal: prevent stroke and major bleeding) -Controlled -CHADSVASC: 5 -Current treatment: Rate control: None Anticoagulation: Eliquis 5 mg twice daily: Appropriate, Effective, Safe, Accessible  -Medications previously tried: NA -Counseled on avoidance of NSAIDs due to increased bleeding risk with anticoagulants; -Recommended to continue current medication  Hyperlipidemia: (LDL goal < 70) -Controlled -Current treatment: Rosuvastatin 10 mg daily  -Medications previously tried: NA  -Educated on Importance of limiting foods high in cholesterol; -Recommended to continue current medication  Hypothyroidism (Goal: Maintain stable thyroid) -Controlled  -Current treatment  Levothyroxine 75 mcg daily  -Medications previously tried:  NA  -Recommended to continue current medication -Continue current medications   Chronic Kidney Disease Stage 2-3a -All medications assessed for renal dosing and appropriateness in chronic kidney disease. -Will need to reduce Eliquis if creatinine > 1.5.  -Recommended to continue current medication  Patient Goals/Self-Care Activities Patient will:  - check blood pressure weekly, document, and provide at future appointments  Follow Up Plan: Telephone follow up appointment with care management team member scheduled for:  11/04/2022 at 3:00 PM     Medication Assistance: None required.  Patient  affirms current coverage meets needs.  Compliance/Adherence/Medication fill history: Care Gaps: Foot exam, ophthalmology exam  Covid Booster   Star-Rating Drugs: Quinapril 20 mg last filled on 11/24/2020 for a 90-Day supply at [facility] Rosuvastatin 20 mg last filled on 11/24/2020 for a 90-Day supply at [facility]  Patient's preferred pharmacy is:  [facility] [address], [place] - [place] [address] [place] [address] Phone: [phone] Fax: [phone]  [place] ([place], [place] [address] Phone: [phone] Fax: [phone]  [facility] - [place], [place] - [address] [place] [address] Phone: [phone] Fax: [phone]  Uses pill box? Yes Pt endorses 100% compliance  We discussed: Current pharmacy is preferred with insurance plan and patient is satisfied with pharmacy services Patient decided to: Continue current medication management strategy  Care Plan and Follow Up Patient Decision:  Patient agrees to Care Plan and Follow-up.  Plan: Telephone follow up appointment with care management team member scheduled for:  11/04/2022 at 3:00 PM

## 2022-05-07 NOTE — Patient Instructions (Addendum)
Visit Information It was great speaking with you today!  Please let me know if you have any questions about our visit.   Goals Addressed             This Visit's Progress    Track and Manage My Blood Pressure-Hypertension   On track    Timeframe:  Long-Range Goal Priority:  High Start Date:  12/11/20                            Expected End Date: 12/12/2022                       Follow Up within 90 days    - check blood pressure weekly    Why is this important?   You won't feel high blood pressure, but it can still hurt your blood vessels.  High blood pressure can cause heart or kidney problems. It can also cause a stroke.  Making lifestyle changes like losing a little weight or eating less salt will help.  Checking your blood pressure at home and at different times of the day can help to control blood pressure.  If the doctor prescribes medicine remember to take it the way the doctor ordered.  Call the office if you cannot afford the medicine or if there are questions about it.     Notes:         Patient Care Plan: General Pharmacy (Adult)     Problem Identified: Hypertension, Hyperlipidemia, Diabetes, Atrial Fibrillation, Coronary Artery Disease, COPD, Hypothyroidism, and BPH   Priority: High     Long-Range Goal: Patient-Specific Goal   Start Date: 12/11/2020  Expected End Date: 05/08/2023  This Visit's Progress: On track  Recent Progress: On track  Priority: High  Note:   Current Barriers:  No barriers noted   Pharmacist Clinical Goal(s):  Patient will maintain control of blood pressure as evidenced by BP less than 140/90  through collaboration with PharmD and provider.   Interventions: 1:1 collaboration with Jerrol Banana., MD regarding development and update of comprehensive plan of care as evidenced by provider attestation and co-signature Inter-disciplinary care team collaboration (see longitudinal plan of care) Comprehensive medication review  performed; medication list updated in electronic medical record  Hypertension (BP goal <140/90) -Controlled -Current treatment: Doxazosin 8 mg nightly: Appropriate, Effective, Safe, Accessible Furosemide 40 mg daily: Appropriate, Effective, Query safe Ramipril 5 mg daily: Appropriate, Effective, Safe, Accessible  -Medications previously tried: NA  -Current home readings: 58/52, 778E systolics  -Denies hypotension symptoms. -Current dietary habits: low sodium diet -Current exercise habits: NA -Continue current medications  Atrial Fibrillation (Goal: prevent stroke and major bleeding) -Controlled -CHADSVASC: 5 -Current treatment: Rate control: None Anticoagulation: Eliquis 5 mg twice daily: Appropriate, Effective, Safe, Accessible  -Medications previously tried: NA -Counseled on avoidance of NSAIDs due to increased bleeding risk with anticoagulants; -Recommended to continue current medication  Hyperlipidemia: (LDL goal < 70) -Controlled -Current treatment: Rosuvastatin 10 mg daily  -Medications previously tried: NA  -Educated on Importance of limiting foods high in cholesterol; -Recommended to continue current medication  Hypothyroidism (Goal: Maintain stable thyroid) -Controlled  -Current treatment  Levothyroxine 75 mcg daily  -Medications previously tried: NA  -Recommended to continue current medication -Continue current medications   Chronic Kidney Disease Stage 2-3a -All medications assessed for renal dosing and appropriateness in chronic kidney disease. -Will need to reduce Eliquis if creatinine > 1.5.  -Recommended  to continue current medication  Patient Goals/Self-Care Activities Patient will:  - check blood pressure weekly, document, and provide at future appointments  Follow Up Plan: Telephone follow up appointment with care management team member scheduled for:  11/04/2022 at 3:00 PM    Patient agreed to services and verbal consent obtained.   Patient  verbalizes understanding of instructions and care plan provided today and agrees to view in [place]. Active MyChart status and patient understanding of how to access instructions and care plan via MyChart confirmed with patient.     Junius Argyle, PharmD, [place], CPP  Clinical Pharmacist Practitioner  [facility] [phone]

## 2022-05-23 ENCOUNTER — Ambulatory Visit: Payer: PPO | Admitting: Dermatology

## 2022-05-26 DIAGNOSIS — I10 Essential (primary) hypertension: Secondary | ICD-10-CM

## 2022-05-26 DIAGNOSIS — I4891 Unspecified atrial fibrillation: Secondary | ICD-10-CM

## 2022-05-30 ENCOUNTER — Telehealth: Payer: Self-pay | Admitting: Family Medicine

## 2022-05-30 DIAGNOSIS — I4891 Unspecified atrial fibrillation: Secondary | ICD-10-CM

## 2022-05-30 DIAGNOSIS — E038 Other specified hypothyroidism: Secondary | ICD-10-CM

## 2022-05-30 DIAGNOSIS — E785 Hyperlipidemia, unspecified: Secondary | ICD-10-CM

## 2022-05-30 DIAGNOSIS — E119 Type 2 diabetes mellitus without complications: Secondary | ICD-10-CM

## 2022-05-30 DIAGNOSIS — I10 Essential (primary) hypertension: Secondary | ICD-10-CM

## 2022-05-30 NOTE — Telephone Encounter (Signed)
Pt's step daughter is wanting to have labs ordered prior to appt on 1/17.  His wife has appt on the same day and is coming in earlier to get labs as well.  Wanted to see if they could do labs at the same time.   CM# [phone] [facility]

## 2022-05-31 ENCOUNTER — Telehealth: Payer: Self-pay

## 2022-05-31 NOTE — Telephone Encounter (Signed)
Copied from [place] [phone]. Topic: General - Other >> May 31, 2022 11:50 AM Sabas Sous wrote: Reason for CRM: Pt's daughter also wants lab orders placed prior to the patients appt.  Duplicate call

## 2022-05-31 NOTE — Telephone Encounter (Signed)
Copied from [place] [id number]. Topic: Appointment Scheduling - Scheduling Inquiry for Clinic >> May 31, 2022 11:48 AM Sabas Sous wrote: Reason for CRM: Pt's daughter called and would like to have pt continue care with Dr. Caryn Section   Best contact: [phone]

## 2022-06-03 NOTE — Telephone Encounter (Signed)
Patient advised.

## 2022-06-03 NOTE — Telephone Encounter (Signed)
Labs ordered. Patient may have labs drawn 1 week prior to appt for physical   Jared Foster, MD  [facility]

## 2022-06-04 NOTE — Telephone Encounter (Signed)
Pt's daughter Ileene Hutchinson called asking if Dr. Caryn Section would see her dad.  She states he would rather have a male provider.  He was a patient of Dr. Marlan Palau for many years.  CB#  [phone]

## 2022-06-04 NOTE — Telephone Encounter (Signed)
Daughter Ileene Hutchinson also would like Dr. Quentin Cornwall or Dr, Caryn Section please speak with her prior to his appt to discuss some issues that are going on with her dad.  CB# [phone]

## 2022-06-05 DIAGNOSIS — I4891 Unspecified atrial fibrillation: Secondary | ICD-10-CM | POA: Diagnosis not present

## 2022-06-05 DIAGNOSIS — E038 Other specified hypothyroidism: Secondary | ICD-10-CM | POA: Diagnosis not present

## 2022-06-05 DIAGNOSIS — E785 Hyperlipidemia, unspecified: Secondary | ICD-10-CM | POA: Diagnosis not present

## 2022-06-05 DIAGNOSIS — I10 Essential (primary) hypertension: Secondary | ICD-10-CM | POA: Diagnosis not present

## 2022-06-05 DIAGNOSIS — E119 Type 2 diabetes mellitus without complications: Secondary | ICD-10-CM | POA: Diagnosis not present

## 2022-06-05 NOTE — Telephone Encounter (Signed)
Returned call to patient's daughter   Daughter confirms that pt had lab work done today   Daughter plans to attend appt   Expresses concern:  Decreased memory, forgets short term conversations Often forget trips to ED Decreased memory during the hour Family hx of alzheimer's Explosive diarrhea for 1.5 years Problem making it to bathroom with urinating, she is not sure if he sees urologist  Eulis Foster, MD  [facility]

## 2022-06-05 NOTE — Progress Notes (Signed)
I,Joseline E Rosas,acting as a scribe for [employer], MD.,have documented all relevant documentation on the behalf of Eulis Foster, MD,as directed by  Eulis Foster, MD while in the presence of Eulis Foster, MD.   Complete physical exam   Patient: Jared Rice   DOB: Jul 27, 1932   89 y.o. Male  MRN: [id number] Visit Date: 06/12/2022  Today's healthcare provider: Eulis Foster, MD   Chief Complaint  Patient presents with   Annual Exam   Subjective    Jared Rice is a 89 y.o. male who presents today for a complete physical exam.   He reports consuming a general diet. The patient does not participate in regular exercise at present. He generally feels fairly well. He reports sleeping well. He does have additional problems to discuss today.  HPI   Bonnie's daughter/the patient's stepdaughter, Etheleen Mayhew, is present    Diarrhea  They report that he has diarrhea daily They state that this prevents him from leaving the home  Described as loose stools  He denies periods of constipation  Denies hematochezia  Denies melena  Family states he sometimes barely makes it to the restroom In time   Memory Changes Family reports that patient normally helps to care for his wife but has been increasingly forgetful and will not remind her to take her supplements  Received a call from the patient's daughter stating that her dad often  and has increased forgetfulness, for example will forget conversations after an hour with his daughter  He is also reported to have been evaluated in the ED and later does not remember ever going to the ED Patient has fam hx of Alzheimer's dementia     Hx of Frequent UTI  Requests for frequent UTI and difficulty with urination  Sometimes cannot make it to the restroom and has urinary leakage    HTN: blood pressure elevated, attribute this to talking during BP measurement, patient reports being  lower normally and in the one hundred teens over 60s  Denies HA, chest pain   Hypothyroidism:  Patient's TSH was mildly elevated  He is agreeable to dose adjustment of synthroid 4mg  Will need repeat TSH in 3 months   AWV done 06/06/2022  Past Medical History:  Diagnosis Date   Actinic keratosis    [place]inal pain [place]    Aortic atherosclerosis ([place]    Arthritis    Atrial fibrillation ([place]    Basal cell carcinoma 02/06/2010   Left shoulder. Superficial.   Basal cell carcinoma 10/11/2013   Right medial forearm. Superficial   Basal cell carcinoma 04/10/2015   Right inf. lat. thigh. Superficial   Basal cell carcinoma 12/09/2016   Right cheek. Superficial.   Chronic airway obstruction, not elsewhere classified    Colon polyps    Complication of anesthesia    SPINAL DID NOT WORK FOR LAST KNEE REPLACEMENT AND HAD TO HAVE GA   Coronary atherosclerosis of native coronary artery    nonobstructive   Diabetes mellitus without complication (HCC)    Essential hypertension, benign    GERD (gastroesophageal reflux disease)    H/O   Glucose intolerance (impaired glucose tolerance)    Heart murmur    History of kidney stones    Hyperlipidemia    Hypothyroidism    Iron deficiency anemia    Knee pain, right    LBBB (left bundle branch block)    Occlusion and stenosis of carotid artery without mention of cerebral infarction    wears  compression stockings   Other dyspnea and respiratory abnormality    w/ pseuodowheeze resolves with purse lip manuever   Precordial pain    Shoulder pain, right    Squamous cell carcinoma of skin 10/06/2007   Right forearm. SCCis   Squamous cell carcinoma of skin 10/11/2013   Left mid lat. pretibial. KA-like pattern.   Tremor    Unspecified sleep apnea    HAS NOT USED CPAP IN 20 YEARS   Venous insufficiency    Wears dentures    full upper   Past Surgical History:  Procedure Laterality Date   CARDIAC CATHETERIZATION     X2   COLONOSCOPY   03/17/12   Dr Byrnett-diverticulosis   ESOPHAGOGASTRODUODENOSCOPY (EGD) WITH PROPOFOL N/A 11/25/2014   Procedure: ESOPHAGOGASTRODUODENOSCOPY (EGD) WITH PROPOFOL;  Surgeon: Lucilla Lame, MD;  Location: [facility];  Service: Endoscopy;  Laterality: N/A;  with biopsy   ESOPHAGOGASTRODUODENOSCOPY (EGD) WITH PROPOFOL N/A 01/05/2015   Procedure: ESOPHAGOGASTRODUODENOSCOPY (EGD) WITH PROPOFOL;  Surgeon: Lucilla Lame, MD;  Location: [facility];  Service: Endoscopy;  Laterality: N/A;   EYE SURGERY Bilateral    cataract    KNEE ARTHROPLASTY Right 09/06/2020   Procedure: COMPUTER ASSISTED TOTAL KNEE ARTHROPLASTY;  Surgeon: Dereck Leep, MD;  Location: [facility];  Service: Orthopedics;  Laterality: Right;   TOTAL KNEE ARTHROPLASTY Left 2012   Social History   Socioeconomic History   Marital status: Married    Spouse name: Not on file   Number of children: 2   Years of education: Not on file   Highest education level: Bachelor's degree (e.g., BA, AB, BS)  Occupational History   Occupation: retired    Comment: [employer]  Tobacco Use   Smoking status: Former    Packs/day: 2.00    Years: 31.00    Total pack years: 62.00    Types: Cigarettes    Quit date: 12/24/1980    Years since quitting: 41.4   Smokeless tobacco: Never   Tobacco comments:    quit in 1982  Vaping Use   Vaping Use: Never used  Substance and Sexual Activity   Alcohol use: No    Alcohol/week: 0.0 standard drinks of alcohol   Drug use: No   Sexual activity: Not on file  Other Topics Concern   Not on file  Social History Narrative   Lives with wife, retired Engineer, drilling, 2 children, 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   Social Determinants of Health   Financial Resource Strain: [place]  (06/06/2022)   Overall Financial Resource Strain (CARDIA)    Difficulty of Paying Living Expenses: Not hard at all  Food Insecurity: No Food Insecurity (06/06/2022)   Hunger Vital Sign    Worried About Running  Out of Food in the Last Year: Never true    [place] in the Last Year: Never true  Transportation Needs: No Transportation Needs (06/06/2022)   PRAPARE - Hydrologist (Medical): No    Lack of Transportation (Non-Medical): No  Physical Activity: Inactive (06/06/2022)   Exercise Vital Sign    Days of Exercise per Week: 0 days    Minutes of Exercise per Session: 0 min  Stress: No Stress Concern Present (06/06/2022)   [place]    Feeling of Stress : Not at all  Social Connections: [place] (06/06/2022)   Social Connection and Isolation Panel [NHANES]    Frequency of Communication with Friends and Family:  More than three times a week    Frequency of Social Gatherings with Friends and Family: More than three times a week    Attends Religious Services: 1 to 4 times per year    Active Member of Clubs or Organizations: Yes    Attends Archivist Meetings: 1 to 4 times per year    Marital Status: Married  Human resources officer Violence: Not At Risk (06/06/2022)   Humiliation, Afraid, Rape, and Kick questionnaire    Fear of Current or Ex-Partner: No    Emotionally Abused: No    Physically Abused: No    Sexually Abused: No   Family Status  Relation Name Status   Mother  Deceased at age 71   Father  Deceased at age 72   Brother  Deceased   Sister  Deceased   Sister  Alive   Sister  Deceased   MGM  Deceased   MGF  Deceased   PGM  Deceased   PGF  Deceased   Neg Hx  (Not Specified)   Family History  Problem Relation Age of Onset   Heart failure Mother 42       congestive   Heart attack Father 71   Alcohol abuse Father    Alzheimer's disease Brother 43   Alzheimer's disease Sister 30   Colon cancer Neg Hx    Liver disease Neg Hx    Allergies  Allergen Reactions   Dexilant [Dexlansoprazole] Rash   Primidone Itching and Rash    Patient Care Team: Eulis Foster, MD as PCP - General (Family Medicine) [place], Peter M, MD as PCP - Cardiology (Cardiology) Dingeldein, Remo Lipps, MD as Consulting Physician (Ophthalmology) Ralene Bathe, MD (Dermatology) Hooten, Laurice Record, MD (Orthopedic Surgery) Caroline More, DPM as Consulting Physician (Podiatry) Germaine Pomfret, [facility] as Pharmacist (Pharmacist)   Medications: Outpatient Medications Prior to Visit  Medication Sig   apixaban (ELIQUIS) 5 MG TABS tablet Take 1 tablet by mouth twice a day   diphenhydramine-acetaminophen (TYLENOL PM) 25-500 MG TABS Take 2 tablets by mouth at bedtime.   doxazosin (CARDURA) 8 MG tablet Take 1 tablet by mouth once daily   furosemide (LASIX) 40 MG tablet Take 1 tablet by mouth once daily (dose change)   ramipril (ALTACE) 5 MG capsule Take 1 capsule by mouth once daily   rosuvastatin (CRESTOR) 10 MG tablet Take 1 tablet by mouth once daily   topiramate (TOPAMAX) 50 MG tablet Take 1 tablet by mouth twice daily   [DISCONTINUED] levothyroxine (SYNTHROID) 75 MCG tablet Take 1 tablet by mouth daily in the morning before breakfast   hydrocortisone 2.5 % cream Apply topically 2 (two) times daily as needed (Rash).   potassium chloride SA (KLOR-CON M20) 20 MEQ tablet Take 1 tablet by mouth daily for 5 days then stop   [DISCONTINUED] clotrimazole-betamethasone (LOTRISONE) lotion Apply topically 2 (two) times daily.   [DISCONTINUED] Hydrocortisone-Iodoquinol (IODOQUINOL-HC EX) Apply 1 application topically daily as needed.   [DISCONTINUED] triamcinolone cream (KENALOG) 0.1 % APPLY TO RASH ON AFFECTED AREA TWICE A DAY FOR 2 WEEKS. AVOID FACE, GROIN, AND AXILLA. LONG TERM USE CAN CAUSE THINNING OF THE SKIN   No facility-administered medications prior to visit.    Review of Systems  Gastrointestinal:  Positive for diarrhea.  Neurological:  Positive for tremors.  All other systems reviewed and are negative.     Objective    BP (!) 148/88 (BP Location: Left Arm, Patient  Position: Sitting, Cuff Size: Large)  Pulse 86   Temp (!) 97.5 F (36.4 C) (Oral)   Resp 16   Ht '6\' 1"'$  (1.854 m)   Wt 251 lb (113.9 kg)   BMI 33.12 kg/m     Physical Exam Vitals reviewed.  Constitutional:      General: He is not in acute distress.    Appearance: Normal appearance. He is not ill-appearing, toxic-appearing or diaphoretic.     Comments: Elderly male seated in exam room chair in NAD, pleasant demeanor   Eyes:     Conjunctiva/sclera: Conjunctivae normal.  Cardiovascular:     Rate and Rhythm: Normal rate and regular rhythm.     Pulses: Normal pulses.     Heart sounds: Normal heart sounds. No murmur heard.    No friction rub. No gallop.  Pulmonary:     Effort: Pulmonary effort is normal. No respiratory distress.     Breath sounds: Normal breath sounds. No stridor. No wheezing, rhonchi or rales.  Abdominal:     General: Bowel sounds are normal. There is no distension.     Palpations: Abdomen is soft.     Tenderness: There is no abdominal tenderness.  Musculoskeletal:     Right lower leg: No edema.     Left lower leg: No edema.  Skin:    Findings: No erythema or rash.  Neurological:     Mental Status: He is alert and oriented to person, place, and time.     Last depression screening scores    06/06/2022    1:51 PM 06/05/2021    3:08 PM 05/31/2020    3:00 PM  PHQ 2/9 Scores  PHQ - 2 Score 0 0 0   Last fall risk screening    06/12/2022    2:26 PM  [place] in the past year? 0  Number falls in past yr: 0  Injury with Fall? 0  Risk for fall due to : No Fall Risks   Last Audit-C alcohol use screening    06/05/2021    3:08 PM  Alcohol Use Disorder Test (AUDIT)  1. How often do you have a drink containing alcohol? 0  2. How many drinks containing alcohol do you have on a typical day when you are drinking? 0  3. How often do you have six or more drinks on one occasion? 0  AUDIT-C Score 0   A score of 3 or more in women, and 4 or more in men  indicates increased risk for alcohol abuse, EXCEPT if all of the points are from question 1   No results found for any visits on 06/12/22.  Assessment & Plan    Routine Health Maintenance and Physical Exam  Exercise Activities and Dietary recommendations  Goals      DIET - EAT MORE FRUITS AND VEGETABLES     Exercise 3x per week (30 min per time)     Recommend to start exercising 3 times a week for 30 minutes at a time.      Patient Stated     Keep doing what I'm doing      Prevent falls     Recommend to remove any items from the home that may cause slips or trips.     Track and Manage My Blood Pressure-Hypertension     Timeframe:  Long-Range Goal Priority:  High Start Date:  12/11/20  Expected End Date: 12/12/2022                       Follow Up within 90 days    - check blood pressure weekly    Why is this important?   You won't feel high blood pressure, but it can still hurt your blood vessels.  High blood pressure can cause heart or kidney problems. It can also cause a stroke.  Making lifestyle changes like losing a little weight or eating less salt will help.  Checking your blood pressure at home and at different times of the day can help to control blood pressure.  If the doctor prescribes medicine remember to take it the way the doctor ordered.  Call the office if you cannot afford the medicine or if there are questions about it.     Notes:         Immunization History  Administered Date(s) Administered   Fluad Quad(high Dose 65+) 02/24/2019, 02/14/2020, 03/28/2021, 03/20/2022   Influenza Split 02/20/2011, 02/27/2012   Influenza, High Dose Seasonal PF 02/22/2014, 02/18/2015, 02/29/2016, 02/26/2017, 04/22/2018   Influenza,inj,Quad PF,6+ Mos 02/13/2013   Influenza-Unspecified 02/24/2013   PFIZER(Purple Top)SARS-COV-2 Vaccination 06/17/2019, 07/08/2019, 04/26/2020   Pneumococcal Conjugate-13 04/14/2014   Pneumococcal Polysaccharide-23  03/25/1997, 04/05/2003   Td 08/29/2003   Tdap 02/20/2011, 02/14/2020   Zoster Recombinat (Shingrix) 02/26/2017, 06/25/2017   Zoster, Live 05/12/2007    Health Maintenance  Topic Date Due   FOOT EXAM  Never done   OPHTHALMOLOGY EXAM  04/26/2020   COVID-19 Vaccine (4 - 2023-24 season) 01/25/2022   HEMOGLOBIN A1C  12/04/2022   Medicare Annual Wellness (AWV)  06/07/2023   DTaP/Tdap/Td (4 - Td or Tdap) 02/13/2030   Pneumonia Vaccine 17+ Years old  Completed   INFLUENZA VACCINE  Completed   Zoster Vaccines- Shingrix  Completed   HPV VACCINES  Aged Out    Discussed health benefits of physical activity, and encouraged him to engage in regular exercise appropriate for his age and condition.  Problem List Items Addressed This Visit       Cardiovascular and Mediastinum   HYPERTENSION, BENIGN    Blood pressure initially elevated  150/95, improved on recheck to 148/88  Will continue ramipril 5 mg daily Continue doxazosin 8 mg daily, prescribed for BPH  Follows with cardiology, Dr. Nehemiah Massed         Endocrine   Hypothyroidism    Reccent TSH mildly elevated with normal T4 Will adjust dose of synthroid to 71mg to see if this helps with diarrhea symptoms  Follow up in 1 month       Relevant Medications   levothyroxine (SYNTHROID) 88 MCG tablet     Genitourinary   Benign prostatic hyperplasia - Primary    Worsening  Patient and family request referral to urology for evaluation  He will continue doxazosin 8 mg daily Ambulatory referral submitted to urology       Relevant Orders   Ambulatory referral to Urology   Frequent UTI    Reports frequent urinary tract infection history Family request that he follow-up with urology Referral submitted to urology       Relevant Orders   Ambulatory referral to Urology     Other   Diarrhea    Patient reports frequent watery stools Unaware of any recent antibiotic treatment Recommended dietary modifications including increased  hydration and increasing fiber rich foods in diet Will follow-up in 1 month Can consider infectious etiology at that  time and collect stool sample if symptoms or not improved with lifestyle modifications Review of labs does not show signs of dehydration at this time Today we adjusted his thyroid medication to see if this helps with symptom management      Memory changes    Previously obtained labs prior to this appointment, notable for mildly elevated TSH No other reversible causes of memory changes noted with labs He has responded normally to Mini-Mental Status questions today Consider MoCA in future for further evaluation        Return in about 1 month (around 07/13/2022) for Diarrhea, hypertension.      The entirety of the information documented in the History of Present Illness, Review of Systems and Physical Exam were personally obtained by me. Portions of this information were initially documented by Lyndel Pleasure, CMA and reviewed by me for thoroughness and accuracy.Eulis Foster, MD     Eulis Foster, MD  [facility] [phone] (phone) [phone] (fax)  [place]

## 2022-06-06 ENCOUNTER — Ambulatory Visit (INDEPENDENT_AMBULATORY_CARE_PROVIDER_SITE_OTHER): Payer: Medicare HMO

## 2022-06-06 VITALS — Wt 245.0 lb

## 2022-06-06 DIAGNOSIS — Z0000 Encounter for general adult medical examination without abnormal findings: Secondary | ICD-10-CM | POA: Diagnosis not present

## 2022-06-06 LAB — CBC
Hematocrit: 38.2 % (ref 37.5–51.0)
Hemoglobin: 12.1 g/dL — ABNORMAL LOW (ref 13.0–17.7)
MCH: 30 pg (ref 26.6–33.0)
MCHC: 31.7 g/dL (ref 31.5–35.7)
MCV: 95 fL (ref 79–97)
Platelets: 164 10*3/uL (ref 150–450)
RBC: 4.03 x10E6/uL — ABNORMAL LOW (ref 4.14–5.80)
RDW: 13 % (ref 11.6–15.4)
WBC: 6 10*3/uL (ref 3.4–10.8)

## 2022-06-06 LAB — COMPREHENSIVE METABOLIC PANEL
ALT: 6 IU/L (ref 0–44)
AST: 6 IU/L (ref 0–40)
Albumin/Globulin Ratio: 2 (ref 1.2–2.2)
Albumin: 3.9 g/dL (ref 3.7–4.7)
Alkaline Phosphatase: 65 IU/L (ref 44–121)
BUN/Creatinine Ratio: 15 (ref 10–24)
BUN: 17 mg/dL (ref 8–27)
Bilirubin Total: 0.4 mg/dL (ref 0.0–1.2)
CO2: 27 mmol/L (ref 20–29)
Calcium: 9 mg/dL (ref 8.6–10.2)
Chloride: 106 mmol/L (ref 96–106)
Creatinine, Ser: 1.17 mg/dL (ref 0.76–1.27)
Globulin, Total: 2 g/dL (ref 1.5–4.5)
Glucose: 97 mg/dL (ref 70–99)
Potassium: 4.3 mmol/L (ref 3.5–5.2)
Sodium: 145 mmol/L — ABNORMAL HIGH (ref 134–144)
Total Protein: 5.9 g/dL — ABNORMAL LOW (ref 6.0–8.5)
eGFR: 60 mL/min/{1.73_m2} (ref >59)

## 2022-06-06 LAB — TSH+FREE T4
Free T4: 1.09 ng/dL (ref 0.82–1.77)
TSH: 4.64 u[IU]/mL — ABNORMAL HIGH (ref 0.450–4.500)

## 2022-06-06 LAB — LIPID PANEL
Chol/HDL Ratio: 2.8 ratio (ref 0.0–5.0)
Cholesterol, Total: 122 mg/dL (ref 100–199)
HDL: 44 mg/dL (ref >39)
LDL Chol Calc (NIH): 68 mg/dL (ref 0–99)
Triglycerides: 39 mg/dL (ref 0–149)
VLDL Cholesterol Cal: 10 mg/dL (ref 5–40)

## 2022-06-06 LAB — HEMOGLOBIN A1C
Est. average glucose Bld gHb Est-mCnc: 123 mg/dL
Hgb A1c MFr Bld: 5.9 % — ABNORMAL HIGH (ref 4.8–5.6)

## 2022-06-06 NOTE — Patient Instructions (Signed)
Mr. Jared Rice , Thank you for taking time to come for your Medicare Wellness Visit. I appreciate your ongoing commitment to your health goals. Please review the following plan we discussed and let me know if I can assist you in the future.   These are the goals we discussed:  Goals      DIET - EAT MORE FRUITS AND VEGETABLES     Exercise 3x per week (30 min per time)     Recommend to start exercising 3 times a week for 30 minutes at a time.      Patient Stated     Keep doing what I'm doing      Prevent falls     Recommend to remove any items from the home that may cause slips or trips.     Track and Manage My Blood Pressure-Hypertension     Timeframe:  Long-Range Goal Priority:  High Start Date:  12/11/20                            Expected End Date: 12/12/2022                       Follow Up within 90 days    - check blood pressure weekly    Why is this important?   You won't feel high blood pressure, but it can still hurt your blood vessels.  High blood pressure can cause heart or kidney problems. It can also cause a stroke.  Making lifestyle changes like losing a little weight or eating less salt will help.  Checking your blood pressure at home and at different times of the day can help to control blood pressure.  If the doctor prescribes medicine remember to take it the way the doctor ordered.  Call the office if you cannot afford the medicine or if there are questions about it.     Notes:         This is a list of the screening recommended for you and due dates:  Health Maintenance  Topic Date Due   Complete foot exam   Never done   Eye exam for diabetics  04/26/2020   COVID-19 Vaccine (4 - 2023-24 season) 01/25/2022   Hemoglobin A1C  12/04/2022   Medicare Annual Wellness Visit  06/07/2023   DTaP/Tdap/Td vaccine (4 - Td or Tdap) 02/13/2030   Pneumonia Vaccine  Completed   Flu Shot  Completed   Zoster (Shingles) Vaccine  Completed   HPV Vaccine  Aged Out     Advanced directives: copies in chart   Conditions/risks identified: keep doing what I'm doing   Next appointment: Follow up in one year for your annual wellness visit.   Preventive Care 25 Years and Older, Male  Preventive care refers to lifestyle choices and visits with your health care provider that can promote health and wellness. What does preventive care include? A yearly physical exam. This is also called an annual well check. Dental exams once or twice a year. Routine eye exams. Ask your health care provider how often you should have your eyes checked. Personal lifestyle choices, including: Daily care of your teeth and gums. Regular physical activity. Eating a healthy diet. Avoiding tobacco and drug use. Limiting alcohol use. Practicing safe sex. Taking low doses of aspirin every day. Taking vitamin and mineral supplements as recommended by your health care provider. What happens during an annual well check? The services  and screenings done by your health care provider during your annual well check will depend on your age, overall health, lifestyle risk factors, and family history of disease. Counseling  Your health care provider may ask you questions about your: Alcohol use. Tobacco use. Drug use. Emotional well-being. Home and relationship well-being. Sexual activity. Eating habits. History of falls. Memory and ability to understand (cognition). Work and work Statistician. Screening  You may have the following tests or measurements: Height, weight, and BMI. Blood pressure. Lipid and cholesterol levels. These may be checked every 5 years, or more frequently if you are over 21 years old. Skin check. Lung cancer screening. You may have this screening every year starting at age 83 if you have a 30-pack-year history of smoking and currently smoke or have quit within the past 15 years. Fecal occult blood test (FOBT) of the stool. You may have this test every year  starting at age 27. Flexible sigmoidoscopy or colonoscopy. You may have a sigmoidoscopy every 5 years or a colonoscopy every 10 years starting at age 30. Prostate cancer screening. Recommendations will vary depending on your family history and other risks. Hepatitis C blood test. Hepatitis B blood test. Sexually transmitted disease (STD) testing. Diabetes screening. This is done by checking your blood sugar (glucose) after you have not eaten for a while (fasting). You may have this done every 1-3 years. Abdominal aortic aneurysm (AAA) screening. You may need this if you are a current or former smoker. Osteoporosis. You may be screened starting at age 29 if you are at high risk. Talk with your health care provider about your test results, treatment options, and if necessary, the need for more tests. Vaccines  Your health care provider may recommend certain vaccines, such as: Influenza vaccine. This is recommended every year. Tetanus, diphtheria, and acellular pertussis (Tdap, Td) vaccine. You may need a Td booster every 10 years. Zoster vaccine. You may need this after age 27. Pneumococcal 13-valent conjugate (PCV13) vaccine. One dose is recommended after age 25. Pneumococcal polysaccharide (PPSV23) vaccine. One dose is recommended after age 67. Talk to your health care provider about which screenings and vaccines you need and how often you need them. This information is not intended to replace advice given to you by your health care provider. Make sure you discuss any questions you have with your health care provider. Document Released: 06/09/2015 Document Revised: 01/31/2016 Document Reviewed: 03/14/2015 Elsevier Interactive Patient Education  2017 [place] Prevention in the Home Falls can cause injuries. They can happen to people of all ages. There are many things you can do to make your home safe and to help prevent falls. What can I do on the outside of my home? Regularly fix  the edges of walkways and driveways and fix any cracks. Remove anything that might make you trip as you walk through a door, such as a raised step or threshold. Trim any bushes or trees on the path to your home. Use bright outdoor lighting. Clear any walking paths of anything that might make someone trip, such as rocks or tools. Regularly check to see if handrails are loose or broken. Make sure that both sides of any steps have handrails. Any raised decks and porches should have guardrails on the edges. Have any leaves, snow, or ice cleared regularly. Use sand or salt on walking paths during winter. Clean up any spills in your garage right away. This includes oil or grease spills. What can I do in  the bathroom? Use night lights. Install grab bars by the toilet and in the tub and shower. Do not use towel bars as grab bars. Use non-skid mats or decals in the tub or shower. If you need to sit down in the shower, use a plastic, non-slip stool. Keep the floor dry. Clean up any water that spills on the floor as soon as it happens. Remove soap buildup in the tub or shower regularly. Attach bath mats securely with double-sided non-slip rug tape. Do not have throw rugs and other things on the floor that can make you trip. What can I do in the bedroom? Use night lights. Make sure that you have a light by your bed that is easy to reach. Do not use any sheets or blankets that are too big for your bed. They should not hang down onto the floor. Have a firm chair that has side arms. You can use this for support while you get dressed. Do not have throw rugs and other things on the floor that can make you trip. What can I do in the kitchen? Clean up any spills right away. Avoid walking on wet floors. Keep items that you use a lot in easy-to-reach places. If you need to reach something above you, use a strong step stool that has a grab bar. Keep electrical cords out of the way. Do not use floor polish  or wax that makes floors slippery. If you must use wax, use non-skid floor wax. Do not have throw rugs and other things on the floor that can make you trip. What can I do with my stairs? Do not leave any items on the stairs. Make sure that there are handrails on both sides of the stairs and use them. Fix handrails that are broken or loose. Make sure that handrails are as long as the stairways. Check any carpeting to make sure that it is firmly attached to the stairs. Fix any carpet that is loose or worn. Avoid having throw rugs at the top or bottom of the stairs. If you do have throw rugs, attach them to the floor with carpet tape. Make sure that you have a light switch at the top of the stairs and the bottom of the stairs. If you do not have them, ask someone to add them for you. What else can I do to help prevent falls? Wear shoes that: Do not have high heels. Have rubber bottoms. Are comfortable and fit you well. Are closed at the toe. Do not wear sandals. If you use a stepladder: Make sure that it is fully opened. Do not climb a closed stepladder. Make sure that both sides of the stepladder are locked into place. Ask someone to hold it for you, if possible. Clearly mark and make sure that you can see: Any grab bars or handrails. First and last steps. Where the edge of each step is. Use tools that help you move around (mobility aids) if they are needed. These include: Canes. Walkers. Scooters. Crutches. Turn on the lights when you go into a dark area. Replace any light bulbs as soon as they burn out. Set up your furniture so you have a clear path. Avoid moving your furniture around. If any of your floors are uneven, fix them. If there are any pets around you, be aware of where they are. Review your medicines with your doctor. Some medicines can make you feel dizzy. This can increase your chance of falling. Ask your doctor what  other things that you can do to help prevent  falls. This information is not intended to replace advice given to you by your health care provider. Make sure you discuss any questions you have with your health care provider. Document Released: 03/09/2009 Document Revised: 10/19/2015 Document Reviewed: 06/17/2014 Elsevier Interactive Patient Education  2017 [employer].

## 2022-06-06 NOTE — Progress Notes (Signed)
I connected with  Gerda Diss Forgette on 06/06/22 by a audio enabled telemedicine application and verified that I am speaking with the correct person using two identifiers.  Patient Location: Home  Provider Location: Office/Clinic  I discussed the limitations of evaluation and management by telemedicine. The patient expressed understanding and agreed to proceed.   Subjective:   Jared Rice is a 89 y.o. male who presents for Medicare Annual/Subsequent preventive examination.  Review of Systems     Cardiac Risk Factors include: advanced age (>72mn, >>68women);hypertension;diabetes mellitus;dyslipidemia;male gender;obesity (BMI >30kg/m2)     Objective:    Today's Vitals   06/06/22 1344  Weight: 245 lb (111.1 kg)   Body mass index is 32.32 kg/m.     06/06/2022    1:54 PM 06/05/2021    3:10 PM 09/06/2020    7:42 PM 09/06/2020   10:24 AM 08/24/2020   12:57 PM 05/31/2020    3:04 PM 05/24/2019   10:51 AM  Advanced Directives  Does Patient Have a Medical Advance Directive? Yes Yes  No Yes Yes Yes  Type of AParamed[place]of [place]Living will [place]ilt[place] ForestLiving will    [place]uth or ConsequencesLiving will [place]Living will  Does patient want to make changes to medical advance directive? No - Patient declined Yes (Inpatient - patient defers changing a medical advance directive and declines information at this time)       Copy of [place] Chart? Yes - validated most recent copy scanned in chart (See row information) Yes - validated most recent copy scanned in chart (See row information)    Yes - validated most recent copy scanned in chart (See row information) Yes - validated most recent copy scanned in chart (See row information)  Would patient like information on creating a medical advance directive?   No - Patient declined        Current Medications (verified) Outpatient Encounter Medications as of  06/06/2022  Medication Sig   apixaban (ELIQUIS) 5 MG TABS tablet Take 1 tablet by mouth twice a day   clotrimazole-betamethasone (LOTRISONE) lotion Apply topically 2 (two) times daily.   diphenhydramine-acetaminophen (TYLENOL PM) 25-500 MG TABS Take 2 tablets by mouth at bedtime.   doxazosin (CARDURA) 8 MG tablet Take 1 tablet by mouth once daily   furosemide (LASIX) 40 MG tablet Take 1 tablet by mouth once daily (dose change)   hydrocortisone 2.5 % cream Apply topically 2 (two) times daily as needed (Rash).   Hydrocortisone-Iodoquinol (IODOQUINOL-HC EX) Apply 1 application topically daily as needed.   levothyroxine (SYNTHROID) 75 MCG tablet Take 1 tablet by mouth daily in the morning before breakfast   ramipril (ALTACE) 5 MG capsule Take 1 capsule by mouth once daily   rosuvastatin (CRESTOR) 10 MG tablet Take 1 tablet by mouth once daily   topiramate (TOPAMAX) 50 MG tablet Take 1 tablet by mouth twice daily   triamcinolone cream (KENALOG) 0.1 % APPLY TO RASH ON AFFECTED AREA TWICE A DAY FOR 2 WEEKS. AVOID FACE, GROIN, AND AXILLA. LONG TERM USE CAN CAUSE THINNING OF THE SKIN   potassium chloride SA (KLOR-CON M20) 20 MEQ tablet Take 1 tablet by mouth daily for 5 days then stop (Patient not taking: Reported on 06/06/2022)   [DISCONTINUED] cetirizine (ZYRTEC ALLERGY) 10 MG tablet Take 1 tablet (10 mg total) by mouth daily.   [DISCONTINUED] traMADol (ULTRAM) 50 MG tablet Take 1-2 tablets (50-100 mg total) by mouth every  6 (six) hours as needed for moderate pain.   No facility-administered encounter medications on file as of 06/06/2022.    Allergies (verified) Dexilant [dexlansoprazole] and Primidone   History: Past Medical History:  Diagnosis Date   Actinic keratosis    Anginal pain ([place])    Aortic atherosclerosis (HCC)    Arthritis    Atrial fibrillation ([place])    Basal cell carcinoma 02/06/2010   Left shoulder. Superficial.   Basal cell carcinoma 10/11/2013   Right medial forearm.  Superficial   Basal cell carcinoma 04/10/2015   Right inf. lat. thigh. Superficial   Basal cell carcinoma 12/09/2016   Right cheek. Superficial.   Chronic airway obstruction, not elsewhere classified    Colon polyps    Complication of anesthesia    SPINAL DID NOT WORK FOR LAST KNEE REPLACEMENT AND HAD TO HAVE GA   Coronary atherosclerosis of native coronary artery    nonobstructive   Diabetes mellitus without complication (HCC)    Essential hypertension, benign    GERD (gastroesophageal reflux disease)    H/O   Glucose intolerance (impaired glucose tolerance)    Heart murmur    History of kidney stones    Hyperlipidemia    Hypothyroidism    Iron deficiency anemia    Knee pain, right    LBBB (left bundle branch block)    Occlusion and stenosis of carotid artery without mention of cerebral infarction    wears compression stockings   Other dyspnea and respiratory abnormality    w/ pseuodowheeze resolves with purse lip manuever   Precordial pain    Shoulder pain, right    Squamous cell carcinoma of skin 10/06/2007   Right forearm. SCCis   Squamous cell carcinoma of skin 10/11/2013   Left mid lat. pretibial. KA-like pattern.   Tremor    Unspecified sleep apnea    HAS NOT USED CPAP IN 20 YEARS   Venous insufficiency    Wears dentures    full upper   Past Surgical History:  Procedure Laterality Date   CARDIAC CATHETERIZATION     X2   COLONOSCOPY  03/17/12   Dr Byrnett-diverticulosis   ESOPHAGOGASTRODUODENOSCOPY (EGD) WITH PROPOFOL N/A 11/25/2014   Procedure: ESOPHAGOGASTRODUODENOSCOPY (EGD) WITH PROPOFOL;  Surgeon: Lucilla Lame, MD;  Location: [place];  Service: Endoscopy;  Laterality: N/A;  with biopsy   ESOPHAGOGASTRODUODENOSCOPY (EGD) WITH PROPOFOL N/A 01/05/2015   Procedure: ESOPHAGOGASTRODUODENOSCOPY (EGD) WITH PROPOFOL;  Surgeon: Lucilla Lame, MD;  Location: [place];  Service: Endoscopy;  Laterality: N/A;   EYE SURGERY Bilateral    cataract     KNEE ARTHROPLASTY Right 09/06/2020   Procedure: COMPUTER ASSISTED TOTAL KNEE ARTHROPLASTY;  Surgeon: Dereck Leep, MD;  Location: [facility];  Service: Orthopedics;  Laterality: Right;   TOTAL KNEE ARTHROPLASTY Left 2012   Family History  Problem Relation Age of Onset   Heart failure Mother 28       congestive   Heart attack Father 83   Alcohol abuse Father    Alzheimer's disease Brother 72   Alzheimer's disease Sister 42   Colon cancer Neg Hx    Liver disease Neg Hx    Social History   Socioeconomic History   Marital status: Married    Spouse name: Not on file   Number of children: 2   Years of education: Not on file   Highest education level: Bachelor's degree (e.g., BA, AB, BS)  Occupational History   Occupation: retired    Comment: [employer]  Transportation  Tobacco Use   Smoking status: Former    Packs/day: 2.00    Years: 31.00    Total pack years: 62.00    Types: Cigarettes    Quit date: 12/24/1980    Years since quitting: 41.4   Smokeless tobacco: Never   Tobacco comments:    quit in 1982  Vaping Use   Vaping Use: Never used  Substance and Sexual Activity   Alcohol use: No    Alcohol/week: 0.0 standard drinks of alcohol   Drug use: No   Sexual activity: Not on file  Other Topics Concern   Not on file  Social History Narrative   Lives with wife, retired Engineer, drilling, 2 children, 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   Social Determinants of Health   Financial Resource Strain: [place]  (06/06/2022)   Overall Financial Resource Strain (CARDIA)    Difficulty of Paying Living Expenses: Not hard at all  Food Insecurity: No Food Insecurity (06/06/2022)   Hunger Vital Sign    Worried About Running Out of Food in the Last Year: Never true    [place] in the Last Year: Never true  Transportation Needs: No Transportation Needs (06/06/2022)   PRAPARE - Hydrologist (Medical): No    Lack of Transportation (Non-Medical): No  Physical  Activity: Inactive (06/06/2022)   Exercise Vital Sign    Days of Exercise per Week: 0 days    Minutes of Exercise per Session: 0 min  Stress: No Stress Concern Present (06/06/2022)   [place]    Feeling of Stress : Not at all  Social Connections: [place] (06/06/2022)   Social Connection and Isolation Panel [NHANES]    Frequency of Communication with Friends and Family: More than three times a week    Frequency of Social Gatherings with Friends and Family: More than three times a week    Attends Religious Services: 1 to 4 times per year    Active Member of [employer] or Organizations: Yes    Attends Archivist Meetings: 1 to 4 times per year    Marital Status: Married    Tobacco Counseling Counseling given: Not Answered Tobacco comments: quit in 1982   Clinical Intake:  Pre-visit preparation completed: Yes  Pain : No/denies pain     BMI - recorded: 32.32 Nutritional Status: BMI > 30  Obese Nutritional Risks: None Diabetes: Yes CBG done?: No  How often do you need to have someone help you when you read instructions, pamphlets, or other written materials from your doctor or pharmacy?: 1 - Never  Diabetic?Nutrition Risk Assessment:  Has the patient had any N/V/D within the last 2 months?  No  Does the patient have any non-healing wounds?  No  Has the patient had any unintentional weight loss or weight gain?  No   Diabetes:  Is the patient diabetic?  Yes  If diabetic, was a CBG obtained today?  No  Did the patient bring in their glucometer from home?  No  How often do you monitor your CBG's? N/A.   Financial Strains and Diabetes Management:  Are you having any financial strains with the device, your supplies or your medication? No .  Does the patient want to be seen by Chronic Care Management for management of their diabetes?  No  Would the patient like to be referred to a Nutritionist  or for Diabetic Management?  No   Diabetic  Exams:  Diabetic Eye Exam: Overdue for diabetic eye exam. Pt has been advised about the importance in completing this exam. Patient advised to call and schedule an eye exam. Diabetic Foot Exam: Overdue, Pt has been advised about the importance in completing this exam. Pt is scheduled for diabetic foot exam on next appt .   Interpreter Needed?: No  Information entered by :: Charlott Rakes, LPN   Activities of Daily Living    06/06/2022    1:54 PM  In your present state of health, do you have any difficulty performing the following activities:  Hearing? 0  Vision? 0  Difficulty concentrating or making decisions? 0  Walking or climbing stairs? 0  Dressing or bathing? 0  Doing errands, shopping? 0  Preparing Food and eating ? N  Using the Toilet? N  In the past six months, have you accidently leaked urine? Y  Comment at times have to rush  Do you have problems with loss of bowel control? N  Managing your Medications? N  Managing your Finances? N  Housekeeping or managing your Housekeeping? N    Patient Care Team: Eulis Foster, MD as PCP - General (Family Medicine) [place], Peter M, MD as PCP - Cardiology (Cardiology) Dingeldein, Remo Lipps, MD as Consulting Physician (Ophthalmology) Ralene Bathe, MD (Dermatology) Marry Guan Laurice Record, MD (Orthopedic Surgery) Caroline More, DPM as Consulting Physician (Podiatry) Germaine Pomfret, [facility] as Pharmacist (Pharmacist)  Indicate any recent Medical Services you may have received from other than [facility] providers in the past year (date may be approximate).     Assessment:   This is a routine wellness examination for Diandre.  Hearing/Vision screen Hearing Screening - Comments:: Pt denies any hearing issues  Vision Screening - Comments:: Pt follows up with [facility] Dr D  Dietary issues and exercise activities discussed: Current Exercise Habits: The patient does not  participate in regular exercise at present   Goals Addressed             This Visit's Progress    Patient Stated       Keep doing what I'm doing        Depression Screen    06/06/2022    1:51 PM 06/05/2021    3:08 PM 05/31/2020    3:00 PM 03/20/2020   12:55 PM 05/24/2019   10:51 AM 05/24/2019   10:44 AM 05/18/2018   10:57 AM  PHQ 2/9 Scores  PHQ - 2 Score 0 0 0 0 0 0 0    Fall Risk    06/06/2022    1:54 PM 06/05/2021    3:11 PM 05/31/2020    3:04 PM 03/20/2020   12:55 PM 05/24/2019   10:51 AM  [place] in the past year? 0 0 1 1 0  Number falls in past yr: 0 0 0 0 0  Injury with Fall? 0 0 0 1 0  Risk for fall due to : Impaired vision;Impaired balance/gait No Fall Risks     Follow up Falls prevention discussed Falls evaluation completed Falls prevention discussed      FALL RISK PREVENTION PERTAINING TO THE HOME:  Any stairs in or around the home? Yes  If so, are there any without handrails? No  Home free of loose throw rugs in walkways, pet beds, electrical cords, etc? Yes  Adequate lighting in your home to reduce risk of falls? Yes   ASSISTIVE DEVICES UTILIZED TO PREVENT FALLS:  Life alert? No  Use  of a cane, walker or w/c? Yes  Grab bars in the bathroom? Yes  Shower chair or bench in shower? Yes  Elevated toilet seat or a handicapped toilet? Yes   TIMED UP AND GO:  Was the test performed? No .   Cognitive Function:        06/06/2022    1:55 PM 05/24/2019   10:55 AM 05/18/2018   11:02 AM 05/15/2017    1:30 PM  6CIT Screen  What Year? 0 points 0 points 0 points 0 points  What month? 0 points 0 points  0 points  What time? 0 points 0 points 0 points 0 points  Count back from 20 0 points 0 points 0 points 0 points  Months in reverse 0 points 0 points 0 points 0 points  Repeat phrase 0 points 0 points 0 points 0 points  Total Score 0 points 0 points  0 points    Immunizations Immunization History  Administered Date(s) Administered    Fluad Quad(high Dose 65+) 02/24/2019, 02/14/2020, 03/28/2021, 03/20/2022   Influenza Split 02/20/2011, 02/27/2012   Influenza, High Dose Seasonal PF 02/22/2014, 02/18/2015, 02/29/2016, 02/26/2017, 04/22/2018   Influenza,inj,Quad PF,6+ Mos 02/13/2013   Influenza-Unspecified 02/24/2013   PFIZER(Purple Top)SARS-COV-2 Vaccination 06/17/2019, 07/08/2019, 04/26/2020   Pneumococcal Conjugate-13 04/14/2014   Pneumococcal Polysaccharide-23 03/25/1997, 04/05/2003   Td 08/29/2003   Tdap 02/20/2011, 02/14/2020   Zoster Recombinat (Shingrix) 02/26/2017, 06/25/2017   Zoster, Live 05/12/2007    TDAP status: Up to date  Flu Vaccine status: Up to date  Pneumococcal vaccine status: Up to date  Covid-19 vaccine status: Completed vaccines  Qualifies for Shingles Vaccine? Yes   Zostavax completed Yes   Shingrix Completed?: Yes  Screening Tests Health Maintenance  Topic Date Due   FOOT EXAM  Never done   OPHTHALMOLOGY EXAM  04/26/2020   COVID-19 Vaccine (4 - 2023-24 season) 01/25/2022   HEMOGLOBIN A1C  12/04/2022   Medicare Annual Wellness (AWV)  06/07/2023   DTaP/Tdap/Td (4 - Td or Tdap) 02/13/2030   Pneumonia Vaccine 73+ Years old  Completed   INFLUENZA VACCINE  Completed   Zoster Vaccines- Shingrix  Completed   HPV VACCINES  Aged Out    Health Maintenance  Health Maintenance Due  Topic Date Due   FOOT EXAM  Never done   OPHTHALMOLOGY EXAM  04/26/2020   COVID-19 Vaccine (4 - 2023-24 season) 01/25/2022    Colorectal cancer screening: No longer required.    Additional Screening:   Vision Screening: Recommended annual ophthalmology exams for early detection of glaucoma and other disorders of the eye. Is the patient up to date with their annual eye exam?  Yes  Who is the provider or what is the name of the office in which the patient attends annual eye exams? [facility]  If pt is not established with a provider, would they like to be referred to a provider to establish  care? No .   Dental Screening: Recommended annual dental exams for proper oral hygiene  Community Resource Referral / Chronic Care Management: CRR required this visit?  No   CCM required this visit?  No      Plan:     I have personally reviewed and noted the following in the patient's chart:   Medical and social history Use of alcohol, tobacco or illicit drugs  Current medications and supplements including opioid prescriptions. Patient is not currently taking opioid prescriptions. Functional ability and status Nutritional status Physical activity Advanced directives List of other  physicians Hospitalizations, surgeries, and ER visits in previous 12 months Vitals Screenings to include cognitive, depression, and falls Referrals and appointments  In addition, I have reviewed and discussed with patient certain preventive protocols, quality metrics, and best practice recommendations. A written personalized care plan for preventive services as well as general preventive health recommendations were provided to patient.     Willette Brace, LPN   06/12/4351   Nurse Notes: none

## 2022-06-10 NOTE — Progress Notes (Deleted)
Cardiology Office Note:    Date:  06/10/2022   ID:  Jared Rice, DOB 08/18/1932, MRN [id number]  PCP:  Eulis Foster, MD  Cardiologist:  Peter Martinique, MD  Electrophysiologist:  None   Referring MD: Mikey Kirschner, PA-C   Chief Complaint: follow-up of lower extremity edema  History of Present Illness:    Jared Rice is a 89 y.o. male with a history of non-obstructive CAD noted on cardiac catheterization in 2005, persistent atrial fibrillation on Eliquis, LBBB, mild bilateral carotid stenosis, hypertension, hyperlipidemia, type 2 diabetes mellitus, chronic lower extremity edema, hypothyroidism, GERD, COPD,  and obstructive sleep apnea who is followed by Dr. Martinique and presents today for follow-up of lower extremity edema.   Patient has had several cardiac catheterization over there years all revealing non-obstructive disease. Last cardiac catheterization I can see is from 2005 and showed mild non-obstructive disease with only 20-30% stenosis of the LAD, 1st Diag, and PDA. Last ischemic evaluation was a Myoview in 05/2010 which was normal. Last Echo in 12/2011 showed LVEF of 55-60% with mild LVH and grade 1 diastolic dysfunction as well as abnormal septal motion and dyssynergy consistent with LBBB. He has chronic lower extremity edema and has had multiple lower extremity dopplers in the past screening for DVT which have been negative (last doppler was in 02/2019). He also has known bilateral carotid stenosis. Last carotid dopplers in 2016 showed  1-49% stenosis of bilateral ICAs was stable from prior imaging in 2013.   Patient was last seen by Karen Chafe, NP, on 03/21/2022 at which time he had recently been diagnosed with cellulitis of his right leg and was being treated with antibiotics. He has chronic lower extremity edema but edema was significantly worse in this leg. Otherwise, he was doing well from a cardiac standpoint. He denied missing any doses of his Eliquis. His Lasix  was increased to '40mg'$  twice daily for 5 days and then he was instructed to go back to once daily dosing. Plan was for follow-up in 1 month with low threshold for lower extremity dopplers to rule out DVT if no improvement in edema.   Non-Obstructive CAD Chronic LBBB Noted on several cardiac catheterizations over the years (last one in 2005). Reportedly had a Myoview in 2012 which was normal (I am unable to see this report but listed in prior cardiac notes). - No chest pain.  - No aspirin due to need for Eliquis.  - Continue statin.  Persistent Atrial Fibrillation Rate controlled. *** - Not on any AV nodal agents.  - Continue chronic anticoagulation with Eliquis '5mg'$  twice daily.  Chronic Lower Extremity Edema Patient has chronic lower extremity edema. This was noted to be worse on the right leg at visit last month after he had recently been diagnosed with cellulitis. Lasix was increased for 5 days. - *** - Continue Lasix '40mg'$  daily.  Carotid Stenosis Last carotid ultrasound in 2016 showed 1-49% stenosis of bilateral ICAs which was stable from prior imaging in 2013. - No aspirin due to need for Eliquis.  - Continue statin. - ***  Hypertension BP well controlled. - Continue Ramipril '5mg'$  daily.  - Also on Cardura '8mg'$  daily.   Hyperlipidemia Lipid panel in 05/2022: Total Cholesterol 122, Triglycerides 39, HDL 44, LDL 68. LDL <70 given non-obstructive CAD. - Continue Crestor '10mg'$  daily.  Type 2 Diabetes Mellitus Hemoglobin A1c 5.9 in 05/2022.  - Management per PCP.  Past Medical History:  Diagnosis Date   Actinic keratosis  Anginal pain (HCC)    Aortic atherosclerosis (HCC)    Arthritis    Atrial fibrillation ([facility])    Basal cell carcinoma 02/06/2010   Left shoulder. Superficial.   Basal cell carcinoma 10/11/2013   Right medial forearm. Superficial   Basal cell carcinoma 04/10/2015   Right inf. lat. thigh. Superficial   Basal cell carcinoma 12/09/2016   Right cheek.  Superficial.   Chronic airway obstruction, not elsewhere classified    Colon polyps    Complication of anesthesia    SPINAL DID NOT WORK FOR LAST KNEE REPLACEMENT AND HAD TO HAVE GA   Coronary atherosclerosis of native coronary artery    nonobstructive   Diabetes mellitus without complication (HCC)    Essential hypertension, benign    GERD (gastroesophageal reflux disease)    H/O   Glucose intolerance (impaired glucose tolerance)    Heart murmur    History of kidney stones    Hyperlipidemia    Hypothyroidism    Iron deficiency anemia    Knee pain, right    LBBB (left bundle branch block)    Occlusion and stenosis of carotid artery without mention of cerebral infarction    wears compression stockings   Other dyspnea and respiratory abnormality    w/ pseuodowheeze resolves with purse lip manuever   Precordial pain    Shoulder pain, right    Squamous cell carcinoma of skin 10/06/2007   Right forearm. SCCis   Squamous cell carcinoma of skin 10/11/2013   Left mid lat. pretibial. KA-like pattern.   Tremor    Unspecified sleep apnea    HAS NOT USED CPAP IN 20 YEARS   Venous insufficiency    Wears dentures    full upper    Past Surgical History:  Procedure Laterality Date   CARDIAC CATHETERIZATION     X2   COLONOSCOPY  03/17/12   Dr Byrnett-diverticulosis   ESOPHAGOGASTRODUODENOSCOPY (EGD) WITH PROPOFOL N/A 11/25/2014   Procedure: ESOPHAGOGASTRODUODENOSCOPY (EGD) WITH PROPOFOL;  Surgeon: Lucilla Lame, MD;  Location: [place];  Service: Endoscopy;  Laterality: N/A;  with biopsy   ESOPHAGOGASTRODUODENOSCOPY (EGD) WITH PROPOFOL N/A 01/05/2015   Procedure: ESOPHAGOGASTRODUODENOSCOPY (EGD) WITH PROPOFOL;  Surgeon: Lucilla Lame, MD;  Location: [place];  Service: Endoscopy;  Laterality: N/A;   EYE SURGERY Bilateral    cataract    KNEE ARTHROPLASTY Right 09/06/2020   Procedure: COMPUTER ASSISTED TOTAL KNEE ARTHROPLASTY;  Surgeon: Dereck Leep, MD;  Location: [facility];  Service: Orthopedics;  Laterality: Right;   TOTAL KNEE ARTHROPLASTY Left 2012    Current Medications: No outpatient medications have been marked as taking for the 06/24/22 encounter (Appointment) with Darreld Mclean, PA-C.     Allergies:   Dexilant [dexlansoprazole] and Primidone   Social History   Socioeconomic History   Marital status: Married    Spouse name: Not on file   Number of children: 2   Years of education: Not on file   Highest education level: Bachelor's degree (e.g., BA, AB, BS)  Occupational History   Occupation: retired    Comment: [employer]  Tobacco Use   Smoking status: Former    Packs/day: 2.00    Years: 31.00    Total pack years: 62.00    Types: Cigarettes    Quit date: 12/24/1980    Years since quitting: 41.4   Smokeless tobacco: Never   Tobacco comments:    quit in 1982  Vaping Use   Vaping Use: Never used  Substance  and Sexual Activity   Alcohol use: No    Alcohol/week: 0.0 standard drinks of alcohol   Drug use: No   Sexual activity: Not on file  Other Topics Concern   Not on file  Social History Narrative   Lives with wife, retired Engineer, drilling, 2 children, 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   Social Determinants of Health   Financial Resource Strain: [place]  (06/06/2022)   Overall Financial Resource Strain (CARDIA)    Difficulty of Paying Living Expenses: Not hard at all  Food Insecurity: No Food Insecurity (06/06/2022)   Hunger Vital Sign    Worried About Running Out of Food in the Last Year: Never true    Ran Out of Food in the Last Year: Never true  Transportation Needs: No Transportation Needs (06/06/2022)   PRAPARE - Hydrologist (Medical): No    Lack of Transportation (Non-Medical): No  Physical Activity: Inactive (06/06/2022)   Exercise Vital Sign    Days of Exercise per Week: 0 days    Minutes of Exercise per Session: 0 min  Stress: No Stress Concern Present (06/06/2022)    [place]    Feeling of Stress : Not at all  Social Connections: [place] (06/06/2022)   Social Connection and Isolation Panel [NHANES]    Frequency of Communication with Friends and Family: More than three times a week    Frequency of Social Gatherings with Friends and Family: More than three times a week    Attends Religious Services: 1 to 4 times per year    Active Member of [employer] or Organizations: Yes    Attends Archivist Meetings: 1 to 4 times per year    Marital Status: Married     Family History: The patient's family history includes Alcohol abuse in his father; Alzheimer's disease (age of onset: 55) in his brother; Alzheimer's disease (age of onset: 32) in his sister; Heart attack (age of onset: 48) in his father; Heart failure (age of onset: 70) in his mother. There is no history of Colon cancer or Liver disease.  ROS:   Please see the history of present illness.     EKGs/Labs/Other Studies Reviewed:    The following studies were reviewed:  Echocardiogram 01/03/2012: Study Conclusions: - Left ventricle: The cavity size was mildly dilated. Wall    thickness was increased in a pattern of mild LVH. There    was mild focal basal hypertrophy of the septum. Systolic    function was normal. The estimated ejection fraction was    in the range of 55% to 60%. Wall motion was normal; there    were no regional wall motion abnormalities. Doppler    parameters are consistent with abnormal left ventricular    relaxation (grade 1 diastolic dysfunction).  - Ventricular septum: Septal motion showed abnormal function    and dyssynergy. These changes are consistent with a left    bundle branch block.  - Left atrium: The atrium was moderately dilated.  - Right atrium: The atrium was mildly dilated.  - Pulmonary arteries: Systolic pressure was mildly    increased. PA peak pressure: 21m Hg (S).   _______________  Carotid Dopplers 07/20/2014: 1-49% stenosis of bilateral ICAs. >50% stenosis of right subclavian artery. <50% of left subclavian artery.  Stable compared to doppler results in 2013.    EKG:  EKG not ordered today.   Recent Labs: 07/19/2021: NT-Pro BNP 1,863 06/05/2022: ALT 6;  BUN 17; Creatinine, Ser 1.17; Hemoglobin 12.1; Platelets 164; Potassium 4.3; Sodium 145; TSH 4.640  Recent Lipid Panel    Component Value Date/Time   CHOL 122 06/05/2022 1021   TRIG 39 06/05/2022 1021   HDL 44 06/05/2022 1021   CHOLHDL 2.8 06/05/2022 1021   LDLCALC 68 06/05/2022 1021    Physical Exam:    Vital Signs: There were no vitals taken for this visit.    Wt Readings from Last 3 Encounters:  06/06/22 245 lb (111.1 kg)  04/05/22 260 lb (117.9 kg)  03/21/22 260 lb 3.2 oz (118 kg)     General: 89 y.o. male in no acute distress. HEENT: Normocephalic and atraumatic. Sclera clear. EOMs intact. Neck: Supple. No carotid bruits. No JVD. Heart: *** RRR. Distinct S1 and S2. No murmurs, gallops, or rubs. Radial and distal pedal pulses 2+ and equal bilaterally. Lungs: No increased work of breathing. Clear to ausculation bilaterally. No wheezes, rhonchi, or rales.  Abdomen: Soft, non-distended, and non-tender to palpation. Bowel sounds present in all 4 quadrants.  MSK: Normal strength and tone for age. *** Extremities: No lower extremity edema.    Skin: Warm and dry. Neuro: Alert and oriented x3. No focal deficits. Psych: Normal affect. Responds appropriately.   Assessment:    No diagnosis found.  Plan:     Disposition: Follow up in ***   Medication Adjustments/Labs and Tests Ordered: Current medicines are reviewed at length with the patient today.  Concerns regarding medicines are outlined above.  No orders of the defined types were placed in this encounter.  No orders of the defined types were placed in this encounter.   There are no Patient Instructions on file for  this visit.   Signed, Darreld Mclean, PA-C  06/10/2022 6:41 PM    [facility]

## 2022-06-12 ENCOUNTER — Ambulatory Visit (INDEPENDENT_AMBULATORY_CARE_PROVIDER_SITE_OTHER): Payer: Medicare HMO | Admitting: Family Medicine

## 2022-06-12 ENCOUNTER — Encounter: Payer: Self-pay | Admitting: Family Medicine

## 2022-06-12 VITALS — BP 148/88 | HR 86 | Temp 97.5°F | Resp 16 | Ht 73.0 in | Wt 251.0 lb

## 2022-06-12 DIAGNOSIS — R350 Frequency of micturition: Secondary | ICD-10-CM | POA: Diagnosis not present

## 2022-06-12 DIAGNOSIS — R413 Other amnesia: Secondary | ICD-10-CM | POA: Diagnosis not present

## 2022-06-12 DIAGNOSIS — E039 Hypothyroidism, unspecified: Secondary | ICD-10-CM | POA: Diagnosis not present

## 2022-06-12 DIAGNOSIS — N390 Urinary tract infection, site not specified: Secondary | ICD-10-CM | POA: Diagnosis not present

## 2022-06-12 DIAGNOSIS — R197 Diarrhea, unspecified: Secondary | ICD-10-CM | POA: Diagnosis not present

## 2022-06-12 DIAGNOSIS — I10 Essential (primary) hypertension: Secondary | ICD-10-CM

## 2022-06-12 DIAGNOSIS — N401 Enlarged prostate with lower urinary tract symptoms: Secondary | ICD-10-CM

## 2022-06-12 MED ORDER — LEVOTHYROXINE SODIUM 88 MCG PO TABS: ORAL_TABLET | ORAL | 0 refills | Status: DC

## 2022-06-12 NOTE — Patient Instructions (Addendum)
Your thyroid levels were abnormal.  I have increased the dose of your Synthroid medication.  We will plan to recheck your thyroid levels in 3 months.  Today we were able to review the labs that were recently collected.  In regards to changes in memory, this is likely associated with mild cognitive impairment.  I do recommend establishing with the organization below to increase activities outside of the home with your wife.  [place], [place] [id number] [phone]  [facility] offers an array of activities for adults age 87 and over.    Hours: Monday - Friday, 8 am - 4 pm; Evening activities beginning at 6 pm, Monday - Thursday.  Most programs & activities require pre-registration due to limited space.    Your blood pressure was elevated today.  I would not make any changes to your blood pressure medication at this time however please follow-up in 1 month for blood pressure recheck and we can discuss if we need to add any additional agents at that time.  Until your next visit, please record blood pressure daily for [place] to review at her next visit.  We have placed a referral to urology to evaluate for frequent urinary tract infections and difficulties with frequent urinating.

## 2022-06-14 NOTE — Assessment & Plan Note (Signed)
Blood pressure initially elevated  150/95, improved on recheck to 148/88  Will continue ramipril 5 mg daily Continue doxazosin 8 mg daily, prescribed for BPH  Follows with cardiology, Dr. Nehemiah Massed

## 2022-06-14 NOTE — Assessment & Plan Note (Signed)
Worsening  Patient and family request referral to urology for evaluation  He will continue doxazosin 8 mg daily Ambulatory referral submitted to urology

## 2022-06-14 NOTE — Assessment & Plan Note (Signed)
Previously obtained labs prior to this appointment, notable for mildly elevated TSH No other reversible causes of memory changes noted with labs He has responded normally to Mini-Mental Status questions today Consider MoCA in future for further evaluation

## 2022-06-14 NOTE — Assessment & Plan Note (Signed)
Reccent TSH mildly elevated with normal T4 Will adjust dose of synthroid to 35mg to see if this helps with diarrhea symptoms  Follow up in 1 month

## 2022-06-14 NOTE — Assessment & Plan Note (Addendum)
Chronic problem dating back to at least Patient reports frequent watery stools Unaware of any recent antibiotic treatment Recommended dietary modifications including increased hydration and increasing fiber rich foods in diet Will follow-up in 1 month Can consider infectious etiology at that time and collect stool sample if symptoms or not improved with lifestyle modifications Review of labs does not show signs of dehydration at this time, has mildly increased Na levels  Today we adjusted his thyroid medication to see if this helps with symptom management

## 2022-06-14 NOTE — Assessment & Plan Note (Signed)
Reports frequent urinary tract infection history Family request that he follow-up with urology Referral submitted to urology

## 2022-06-19 ENCOUNTER — Telehealth: Payer: Self-pay

## 2022-06-19 DIAGNOSIS — E038 Other specified hypothyroidism: Secondary | ICD-10-CM

## 2022-06-19 NOTE — Telephone Encounter (Signed)
Patient's daughter Jared Rice advised

## 2022-06-19 NOTE — Telephone Encounter (Signed)
Three month follow up will be for thyroid labs. Patient's additional appointments with PCP will be determined based on symptoms/clinical discussion at his upcoming February appt.   Future lab ordered   Eulis Foster, MD  [facility]

## 2022-06-19 NOTE — Telephone Encounter (Signed)
Copied from [place] [id number]. Topic: Appointment Scheduling - Scheduling Inquiry for Clinic >> Jun 19, 2022 10:24 AM Jared Rice wrote: Patient's daughter, Jared Rice called to see if 3 mo f/u for thyroid was just going to be labs or if patient needed a visit with pcp. No lab orders in chart. Please follow up with patient's daughter for scheduling.
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
## 2022-06-24 ENCOUNTER — Ambulatory Visit: Payer: PPO | Attending: Student | Admitting: Cardiology

## 2022-06-24 ENCOUNTER — Encounter: Payer: Self-pay | Admitting: Student

## 2022-06-24 VITALS — BP 130/72 | HR 80 | Ht 73.0 in | Wt 248.0 lb

## 2022-06-24 DIAGNOSIS — I10 Essential (primary) hypertension: Secondary | ICD-10-CM | POA: Diagnosis not present

## 2022-06-24 DIAGNOSIS — E785 Hyperlipidemia, unspecified: Secondary | ICD-10-CM | POA: Diagnosis not present

## 2022-06-24 DIAGNOSIS — I7121 Aneurysm of the ascending aorta, without rupture: Secondary | ICD-10-CM | POA: Diagnosis not present

## 2022-06-24 DIAGNOSIS — I447 Left bundle-branch block, unspecified: Secondary | ICD-10-CM

## 2022-06-24 DIAGNOSIS — I4819 Other persistent atrial fibrillation: Secondary | ICD-10-CM | POA: Diagnosis not present

## 2022-06-24 DIAGNOSIS — R600 Localized edema: Secondary | ICD-10-CM

## 2022-06-24 DIAGNOSIS — I2510 Atherosclerotic heart disease of native coronary artery without angina pectoris: Secondary | ICD-10-CM | POA: Diagnosis not present

## 2022-06-24 NOTE — Patient Instructions (Signed)
Medication Instructions:  No changes *If you need a refill on your cardiac medications before your next appointment, please call your pharmacy*   Lab Work: None If you have labs (blood work) drawn today and your tests are completely normal, you will receive your results only by: [place] (if you have MyChart) OR A paper copy in the mail If you have any lab test that is abnormal or we need to change your treatment, we will call you to review the results.  Follow-Up: At [facility], you and your health needs are our priority.  As part of our continuing mission to provide you with exceptional heart care, we have created designated Provider Care Teams.  These Care Teams include your primary Cardiologist (physician) and Advanced Practice Providers (APPs -  Physician Assistants and Nurse Practitioners) who all work together to provide you with the care you need, when you need it.  We recommend signing up for the patient portal called "MyChart".  Sign up information is provided on this After Visit Summary.  MyChart is used to connect with patients for Virtual Visits (Telemedicine).  Patients are able to view lab/test results, encounter notes, upcoming appointments, etc.  Non-urgent messages can be sent to your provider as well.   To learn more about what you can do with MyChart, go to [email or web address].    Your next appointment:   6 month(s)  Provider:   Peter Martinique, MD     Instructions: Wear compression socks.  Elevate feet when able.

## 2022-06-28 DIAGNOSIS — B351 Tinea unguium: Secondary | ICD-10-CM | POA: Diagnosis not present

## 2022-06-28 DIAGNOSIS — M2042 Other hammer toe(s) (acquired), left foot: Secondary | ICD-10-CM | POA: Diagnosis not present

## 2022-06-28 DIAGNOSIS — E6609 Other obesity due to excess calories: Secondary | ICD-10-CM | POA: Diagnosis not present

## 2022-06-28 DIAGNOSIS — E1142 Type 2 diabetes mellitus with diabetic polyneuropathy: Secondary | ICD-10-CM | POA: Diagnosis not present

## 2022-06-28 DIAGNOSIS — M216X2 Other acquired deformities of left foot: Secondary | ICD-10-CM | POA: Diagnosis not present

## 2022-06-28 DIAGNOSIS — L84 Corns and callosities: Secondary | ICD-10-CM | POA: Diagnosis not present

## 2022-06-28 DIAGNOSIS — I739 Peripheral vascular disease, unspecified: Secondary | ICD-10-CM | POA: Diagnosis not present

## 2022-06-28 DIAGNOSIS — Z6832 Body mass index (BMI) 32.0-32.9, adult: Secondary | ICD-10-CM | POA: Diagnosis not present

## 2022-06-28 DIAGNOSIS — M2041 Other hammer toe(s) (acquired), right foot: Secondary | ICD-10-CM | POA: Diagnosis not present

## 2022-06-28 DIAGNOSIS — M216X1 Other acquired deformities of right foot: Secondary | ICD-10-CM | POA: Diagnosis not present

## 2022-07-01 ENCOUNTER — Ambulatory Visit: Payer: PPO | Admitting: Family Medicine

## 2022-07-01 ENCOUNTER — Ambulatory Visit: Payer: Self-pay | Admitting: *Deleted

## 2022-07-01 ENCOUNTER — Encounter: Payer: Self-pay | Admitting: Family Medicine

## 2022-07-01 ENCOUNTER — Ambulatory Visit (INDEPENDENT_AMBULATORY_CARE_PROVIDER_SITE_OTHER): Payer: PPO | Admitting: Family Medicine

## 2022-07-01 VITALS — BP 143/78 | HR 87 | Temp 99.8°F | Ht 74.0 in | Wt 249.0 lb

## 2022-07-01 DIAGNOSIS — N401 Enlarged prostate with lower urinary tract symptoms: Secondary | ICD-10-CM

## 2022-07-01 DIAGNOSIS — N390 Urinary tract infection, site not specified: Secondary | ICD-10-CM | POA: Diagnosis not present

## 2022-07-01 DIAGNOSIS — R351 Nocturia: Secondary | ICD-10-CM

## 2022-07-01 DIAGNOSIS — R350 Frequency of micturition: Secondary | ICD-10-CM

## 2022-07-01 LAB — POCT URINALYSIS DIPSTICK
Bilirubin, UA: NEGATIVE
Glucose, UA: NEGATIVE
Ketones, UA: NEGATIVE
Leukocytes, UA: NEGATIVE
Nitrite, UA: NEGATIVE
Protein, UA: NEGATIVE
Spec Grav, UA: 1.01 (ref 1.010–1.025)
Urobilinogen, UA: 4 E.U./dL — AB
pH, UA: 7 (ref 5.0–8.0)

## 2022-07-01 MED ORDER — FINASTERIDE 5 MG PO TABS: 5.0000 mg | ORAL_TABLET | Freq: Every day | ORAL | 0 refills | Status: DC

## 2022-07-01 NOTE — Progress Notes (Signed)
    SUBJECTIVE:   CHIEF COMPLAINT / HPI:   URINARY SYMPTOMS - h/o frequent UTI and BPH, referred to urology 05/2022. - had shaking tremors of arms and legs this morning which typically occur with UTIs per patient. - on doxazosin, reports compliance. - has upcoming appt with Urology 2/16 - denies personal or family h/o prostate cancer.  Dysuria: no Urinary frequency:  maybe Urgency: yes, no change from usual Small volume voids: sometimes Urinary incontinence: yes Foul odor: no Hematuria: no Abdominal pain: no Back pain: no Suprapubic pain/pressure: no Flank pain: no Fever:  no Vomiting: no Relief with cranberry juice: hasn't taken Relief with pyridium: hasn't taken Previous urinary tract infection: yes Recurrent urinary tract infection: reports yes, no recurrent positive cultures per chart review. Patient reports last UTI has been "a while." History of sexually transmitted disease: no Penile discharge: no Treatments attempted: none   IPSS Questionnaire (AUA-7): Over the past month.   1)  How often have you had a sensation of not emptying your bladder completely after you finish urinating?  0 - Not at all  2)  How often have you had to urinate again less than two hours after you finished urinating? 2 - Less than half the time  3)  How often have you found you stopped and started again several times when you urinated?  3 - About half the time  4) How difficult have you found it to postpone urination?  5 - Almost always  5) How often have you had a weak urinary stream?  2 - Less than half the time  6) How often have you had to push or strain to begin urination?  0 - Not at all  7) How many times did you most typically get up to urinate from the time you went to bed until the time you got up in the morning?  2 - 2 times  Total score:  0-7 mildly symptomatic   8-19 moderately symptomatic   20-35 severely symptomatic       OBJECTIVE:   BP (!) 143/78 (BP Location: Right  Arm, Patient Position: Sitting, Cuff Size: Large)   Pulse 87   Temp 99.8 F (37.7 C)   Ht '6\' 2"'$  (1.88 m)   Wt 249 lb (112.9 kg)   SpO2 90%   BMI 31.97 kg/m   Gen: well appearing, in NAD Card: Reg rate Lungs: Comfortable WOB on RA Ext: WWP, no edema   ASSESSMENT/PLAN:   Benign prostatic hyperplasia Suspect urinary symptoms more from undercontrolled BPH, U/A not consistent with infection however will send for culture to rule out. On max dose of doxazosin, will initiate finasteride ahead of urology appt. Last PSA 2015 wnl, will recheck today. Some blood on dipstick today, recommend repeat at follow up. F/u prn.      Myles Gip, DO

## 2022-07-01 NOTE — Telephone Encounter (Signed)
  Chief Complaint: UTI Symptoms Symptoms: Frequency and urgency, shaking, "Maybe a little fever" Frequency: Last night Pertinent Negatives: Patient denies dysuria Disposition: '[]'$ ED /'[]'$ Urgent Care (no appt availability in office) / '[x]'$ Appointment(In office/virtual)/ '[]'$  [facility]/ '[]'$ Home Care/ '[]'$ Refused Recommended Disposition /'[]'$ [facility]/ '[]'$  Follow-up with PCP Additional Notes: Pt has appt at 1300 today. Pt requesting Cipro be sent in "I get this a few times a year and Dr. Rosanna Randy would send this in for me. I really don't feel well enouht to come in." Reports "I shake when I get these and that's what's happening." Advised would need to be seen, explained reasoning for appt/ specimen/antibiotics. Pt requests to ask provider. Advised to not cancel appt and NT would route to practice for PCPs review. Advised would more than likely need to be seen.  Requesting CB. Reason for Disposition . Urinating more frequently than usual (i.e., frequency)  Answer Assessment - Initial Assessment Questions 1. SYMPTOM: "What's the main symptom you're concerned about?" (e.g., frequency, incontinence)     Frequency and urgency 2. ONSET: "When did the    start?"     Last night 3. PAIN: "Is there any pain?" If Yes, ask: "How bad is it?" (Scale: 1-10; mild, moderate, severe)     no 4. CAUSE: "What do you think is causing the symptoms?"     UTI 5. OTHER SYMPTOMS: "Do you have any other symptoms?" (e.g., blood in urine, fever, flank pain, pain with urination)     No pain, "Shaking"   "Maybe a little fever."  Protocols used: Urinary Symptoms-A-AH

## 2022-07-01 NOTE — Progress Notes (Deleted)
      Established patient visit   Patient: Jared Rice   DOB: [birth year]   89 y.o. Male  MRN: [id number] Visit Date: 07/01/2022  Today's healthcare provider: Gwyneth Sprout, FNP   No chief complaint on file.  Subjective    HPI  Urinary symptoms  He reports {chronicity:119221} {urinary symptoms:765916}. The current episode started {onset initial:119223} and is {progression:119226}. Patient states symptoms are {severity:119268} in intensity, occurring {frequency of symptoms:119294}. He  {recent treatment:18834} been recently treated for similar symptoms.    Associated symptoms: {Yes/No:20286} abdominal pain {Yes/No:20286} back pain  {Yes/No:20286} chills {Yes/No:20286} constipation  {Yes/No:20286} cramping {Yes/No:20286} diarrhea  {Yes/No:20286} discharge {Yes/No:20286} fever  {Yes/No:20286} hematuria {Yes/No:20286} nausea  {Yes/No:20286} vomiting    ---------------------------------------------------------------------------------------   Medications: Outpatient Medications Prior to Visit  Medication Sig   apixaban (ELIQUIS) 5 MG TABS tablet Take 1 tablet by mouth twice a day   diphenhydramine-acetaminophen (TYLENOL PM) 25-500 MG TABS Take 2 tablets by mouth at bedtime.   doxazosin (CARDURA) 8 MG tablet Take 1 tablet by mouth once daily   furosemide (LASIX) 40 MG tablet Take 1 tablet by mouth once daily (dose change)   hydrocortisone 2.5 % cream Apply topically 2 (two) times daily as needed (Rash).   levothyroxine (SYNTHROID) 88 MCG tablet Take 1 tablet by mouth daily in the morning before breakfast   ramipril (ALTACE) 5 MG capsule Take 1 capsule by mouth once daily   rosuvastatin (CRESTOR) 10 MG tablet Take 1 tablet by mouth once daily   topiramate (TOPAMAX) 50 MG tablet Take 1 tablet by mouth twice daily   No facility-administered medications prior to visit.    Review of Systems  {Labs  Heme  Chem  Endocrine  Serology  Results Review (optional):23779}    Objective    There were no vitals taken for this visit. {Show previous vital signs (optional):23777}  Physical Exam  ***  No results found for any visits on 07/01/22.  Assessment & Plan     ***  No follow-ups on file.      {provider attestation***:1}   Gwyneth Sprout, [place] [phone] (phone) [phone] (fax)  [place]

## 2022-07-01 NOTE — Assessment & Plan Note (Addendum)
Suspect urinary symptoms more from undercontrolled BPH, U/A not consistent with infection however will send for culture to rule out. On max dose of doxazosin, will initiate finasteride ahead of urology appt. Last PSA 2015 wnl, will recheck today. Some blood on dipstick today, recommend repeat at follow up. F/u prn.

## 2022-07-02 LAB — PSA: Prostate Specific Ag, Serum: 3.5 ng/mL (ref 0.0–4.0)

## 2022-07-04 LAB — URINE CULTURE: Organism ID, Bacteria: NO GROWTH

## 2022-07-11 ENCOUNTER — Telehealth: Payer: Self-pay

## 2022-07-11 NOTE — Telephone Encounter (Signed)
Patient called his [facility] intertrigo cream is on the way from [facility], he wanted to know what can he use until his cream arrives, discussed with patient he can use otc hydrocortisone cream until his prescription arrives.

## 2022-07-12 ENCOUNTER — Ambulatory Visit: Payer: PPO | Admitting: Urology

## 2022-07-15 ENCOUNTER — Ambulatory Visit (INDEPENDENT_AMBULATORY_CARE_PROVIDER_SITE_OTHER): Payer: PPO | Admitting: Dermatology

## 2022-07-15 VITALS — BP 166/93

## 2022-07-15 DIAGNOSIS — Z8589 Personal history of malignant neoplasm of other organs and systems: Secondary | ICD-10-CM

## 2022-07-15 DIAGNOSIS — L821 Other seborrheic keratosis: Secondary | ICD-10-CM | POA: Diagnosis not present

## 2022-07-15 DIAGNOSIS — D229 Melanocytic nevi, unspecified: Secondary | ICD-10-CM

## 2022-07-15 DIAGNOSIS — Z85828 Personal history of other malignant neoplasm of skin: Secondary | ICD-10-CM

## 2022-07-15 DIAGNOSIS — L304 Erythema intertrigo: Secondary | ICD-10-CM

## 2022-07-15 DIAGNOSIS — L570 Actinic keratosis: Secondary | ICD-10-CM

## 2022-07-15 DIAGNOSIS — Z79899 Other long term (current) drug therapy: Secondary | ICD-10-CM

## 2022-07-15 DIAGNOSIS — D692 Other nonthrombocytopenic purpura: Secondary | ICD-10-CM | POA: Diagnosis not present

## 2022-07-15 DIAGNOSIS — Z7189 Other specified counseling: Secondary | ICD-10-CM | POA: Diagnosis not present

## 2022-07-15 DIAGNOSIS — L578 Other skin changes due to chronic exposure to nonionizing radiation: Secondary | ICD-10-CM | POA: Diagnosis not present

## 2022-07-15 DIAGNOSIS — Z1283 Encounter for screening for malignant neoplasm of skin: Secondary | ICD-10-CM

## 2022-07-15 DIAGNOSIS — L814 Other melanin hyperpigmentation: Secondary | ICD-10-CM

## 2022-07-15 MED ORDER — FLUCONAZOLE 200 MG PO TABS: 200.0000 mg | ORAL_TABLET | ORAL | 0 refills | Status: DC

## 2022-07-15 NOTE — Progress Notes (Deleted)
Established patient visit   Patient: Jared Rice   DOB: 10-16-1932   89 y.o. Male  MRN: [id number] Visit Date: 07/16/2022  Today's healthcare provider: Eulis Foster, MD   No chief complaint on file.  Subjective    HPI  Hypertension, follow-up  BP Readings from Last 3 Encounters:  07/01/22 (!) 143/78  06/24/22 130/72  06/12/22 (!) 148/88   Wt Readings from Last 3 Encounters:  07/01/22 249 lb (112.9 kg)  06/24/22 248 lb (112.5 kg)  06/12/22 251 lb (113.9 kg)     He was last seen for hypertension 1 months ago.  BP at that visit was 148/88. Management since that visit includes continue ramipril 5 mg daily Continue doxazosin 8 mg daily, prescribed for .  He reports {excellent/good/fair/poor:19665} compliance with treatment.  Outside blood pressures are {***enter patient reported home BP readings, or 'not being checked':1}.  Hypothyroid and Diarrhea, follow-up  Lab Results  Component Value Date   TSH 4.640 (H) 06/05/2022   TSH 5.360 (H) 10/26/2021   TSH 8.240 (H) 07/19/2021   FREET4 1.09 06/05/2022   FREET4 1.20 10/26/2021    Wt Readings from Last 3 Encounters:  07/01/22 249 lb (112.9 kg)  06/24/22 248 lb (112.5 kg)  06/12/22 251 lb (113.9 kg)    He was last seen for hypothyroid 1 months ago.  Management since that visit includes adjust dose of synthroid to 73mg to see if this helps with diarrhea symptoms .  Symptoms: {Yes/No:20286} change in energy level {Yes/No:20286} constipation  {Yes/No:20286} diarrhea {Yes/No:20286} heat / cold intolerance  {Yes/No:20286} nervousness {Yes/No:20286} palpitations  {Yes/No:20286} weight changes    -----------------------------------------------------------------------------------------  Medications: Outpatient Medications Prior to Visit  Medication Sig   apixaban (ELIQUIS) 5 MG TABS tablet Take 1 tablet by mouth twice a day   diphenhydramine-acetaminophen (TYLENOL PM) 25-500 MG TABS Take 2 tablets  by mouth at bedtime.   doxazosin (CARDURA) 8 MG tablet Take 1 tablet by mouth once daily   finasteride (PROSCAR) 5 MG tablet Take 1 tablet (5 mg total) by mouth daily.   furosemide (LASIX) 40 MG tablet Take 1 tablet by mouth once daily (dose change)   hydrocortisone 2.5 % cream Apply topically 2 (two) times daily as needed (Rash).   levothyroxine (SYNTHROID) 88 MCG tablet Take 1 tablet by mouth daily in the morning before breakfast   ramipril (ALTACE) 5 MG capsule Take 1 capsule by mouth once daily   rosuvastatin (CRESTOR) 10 MG tablet Take 1 tablet by mouth once daily   topiramate (TOPAMAX) 50 MG tablet Take 1 tablet by mouth twice daily   No facility-administered medications prior to visit.    Review of Systems  {Labs  Heme  Chem  Endocrine  Serology  Results Review (optional):23779}   Objective    There were no vitals taken for this visit. {Show previous vital signs (optional):23777}  Physical Exam  ***  No results found for any visits on 07/16/22.  Assessment & Plan     Problem List Items Addressed This Visit   None    No follow-ups on file.        The entirety of the information documented in the History of Present Illness, Review of Systems and Physical Exam were personally obtained by me. Portions of this information were initially documented by *** and reviewed by me for thoroughness and accuracy.MEulis Foster MD     MEulis Foster MD  [phone](phone) [phone](fax)   [facility]  

## 2022-07-15 NOTE — Progress Notes (Unsigned)
Follow-Up Visit   Subjective  Jared Rice is a 89 y.o. male who presents for the following: Annual Exam (History of BCC and SCC - The patient presents for Upper Body Skin Exam (UBSE) for skin cancer screening and mole check.  The patient has spots, moles and lesions to be evaluated, some may be new or changing and the patient has concerns that these could be cancer./).  Accompanied by wife  The following portions of the chart were reviewed this encounter and updated as appropriate:   Tobacco  Allergies  Meds  Problems  Med Hx  Surg Hx  Fam Hx     Review of Systems:  No other skin or systemic complaints except as noted in HPI or Assessment and Plan.  Objective  Well appearing patient in no apparent distress; mood and affect are within normal limits.  All skin waist up examined.  Face (6) Erythematous thin papules/macules with gritty scale.   Groin areas Erythema and maceration    Assessment & Plan   History of Basal Cell Carcinoma of the Skin - No evidence of recurrence today - Recommend regular full body skin exams - Recommend daily broad spectrum sunscreen SPF 30+ to sun-exposed areas, reapply every 2 hours as needed.  - Call if any new or changing lesions are noted between office visits  History of Squamous Cell Carcinoma of the Skin - No evidence of recurrence today - No lymphadenopathy - Recommend regular full body skin exams - Recommend daily broad spectrum sunscreen SPF 30+ to sun-exposed areas, reapply every 2 hours as needed.  - Call if any new or changing lesions are noted between office visits  Lentigines - Scattered tan macules - Due to sun exposure - Benign-appearing, observe - Recommend daily broad spectrum sunscreen SPF 30+ to sun-exposed areas, reapply every 2 hours as needed. - Call for any changes  Seborrheic Keratoses - Stuck-on, waxy, tan-brown papules and/or plaques  - Benign-appearing - Discussed benign etiology and prognosis. -  Observe - Call for any changes  Melanocytic Nevi - Tan-brown and/or pink-flesh-colored symmetric macules and papules - Benign appearing on exam today - Observation - Call clinic for new or changing moles - Recommend daily use of broad spectrum spf 30+ sunscreen to sun-exposed areas.   Hemangiomas - Red papules - Discussed benign nature - Observe - Call for any changes  Actinic Damage - Chronic condition, secondary to cumulative UV/sun exposure - diffuse scaly erythematous macules with underlying dyspigmentation - Recommend daily broad spectrum sunscreen SPF 30+ to sun-exposed areas, reapply every 2 hours as needed.  - Staying in the shade or wearing long sleeves, sun glasses (UVA+UVB protection) and wide brim hats (4-inch brim around the entire circumference of the hat) are also recommended for sun protection.  - Call for new or changing lesions.  Skin cancer screening performed today.  Purpura - Chronic; persistent and recurrent.  Treatable, but not curable. - Violaceous macules and patches - Benign - Related to trauma, age, sun damage and/or use of blood thinners, chronic use of topical and/or oral steroids - Observe - Can use OTC arnica containing moisturizer such as Dermend Bruise Formula if desired - Call for worsening or other concerns  AK (actinic keratosis) (6) Face Destruction of lesion - Face Complexity: simple   Destruction method: cryotherapy   Informed consent: discussed and consent obtained   Timeout:  patient name, date of birth, surgical site, and procedure verified Lesion destroyed using liquid nitrogen: Yes   Region frozen  until ice ball extended beyond lesion: Yes   Outcome: patient tolerated procedure well with no complications   Post-procedure details: wound care instructions given    Erythema intertrigo Groin areas Chronic and persistent condition with duration or expected duration over one year. Condition is bothersome/symptomatic for patient.  Currently flared. Intertrigo is a chronic recurrent rash that occurs in skin fold areas that may be associated with friction; heat; moisture; yeast; fungus; and bacteria.  It is exacerbated by increased movement / activity; sweating; and higher atmospheric temperature.  Start Diflucan 200 mg 1 po qweek for 4 weeks Continue topical treatment.  fluconazole (DIFLUCAN) 200 MG tablet - Groin areas Take 1 tablet (200 mg total) by mouth as directed. Take one tablet per week for 4 weeks  Return in about 3 months (around 10/13/2022) for AK follow up.  I, Ashok Cordia, CMA, am acting as scribe for Sarina Ser, MD . Documentation: I have reviewed the above documentation for accuracy and completeness, and I agree with the above.  Sarina Ser, MD

## 2022-07-15 NOTE — Patient Instructions (Signed)
Cryotherapy Aftercare  Wash gently with soap and water everyday.   Apply Vaseline and Band-Aid daily until healed.     Due to recent changes in healthcare laws, you may see results of your pathology and/or laboratory studies on MyChart before the doctors have had a chance to review them. We understand that in some cases there may be results that are confusing or concerning to you. Please understand that not all results are received at the same time and often the doctors may need to interpret multiple results in order to provide you with the best plan of care or course of treatment. Therefore, we ask that you please give us 2 business days to thoroughly review all your results before contacting the office for clarification. Should we see a critical lab result, you will be contacted sooner.   If You Need Anything After Your Visit  If you have any questions or concerns for your doctor, please call our main line at [phone] and press option 4 to reach your doctor's medical assistant. If no one answers, please leave a voicemail as directed and we will return your call as soon as possible. Messages left after 4 pm will be answered the following business day.   You may also send us a message via MyChart. We typically respond to MyChart messages within 1-2 business days.  For prescription refills, please ask your pharmacy to contact our office. Our fax number is [phone].  If you have an urgent issue when the clinic is closed that cannot wait until the next business day, you can page your doctor at the number below.    Please note that while we do our best to be available for urgent issues outside of office hours, we are not available 24/7.   If you have an urgent issue and are unable to reach us, you may choose to seek medical care at your doctor's office, retail clinic, urgent care center, or emergency room.  If you have a medical emergency, please immediately call 911 or go to the  emergency department.  Pager Numbers  - Dr. Kowalski: [phone]  - Dr. Moye: [phone]  - Dr. Stewart: [phone]  In the event of inclement weather, please call our main line at [phone] for an update on the status of any delays or closures.  Dermatology Medication Tips: Please keep the boxes that topical medications come in in order to help keep track of the instructions about where and how to use these. Pharmacies typically print the medication instructions only on the boxes and not directly on the medication tubes.   If your medication is too expensive, please contact our office at [phone] option 4 or send us a message through MyChart.   We are unable to tell what your co-pay for medications will be in advance as this is different depending on your insurance coverage. However, we may be able to find a substitute medication at lower cost or fill out paperwork to get insurance to cover a needed medication.   If a prior authorization is required to get your medication covered by your insurance company, please allow us 1-2 business days to complete this process.  Drug prices often vary depending on where the prescription is filled and some pharmacies may offer cheaper prices.  The website www.goodrx.com contains coupons for medications through different pharmacies. The prices here do not account for what the cost may be with help from insurance (it may be cheaper with your insurance), but the website can   give you the price if you did not use any insurance.  - You can print the associated coupon and take it with your prescription to the pharmacy.  - You may also stop by our office during regular business hours and pick up a GoodRx coupon card.  - If you need your prescription sent electronically to a different pharmacy, notify our office through [facility] MyChart or by phone at [phone] option 4.     Si Usted Necesita Algo Despus de Su Visita  Tambin puede  enviarnos un mensaje a travs de MyChart. Por lo general respondemos a los mensajes de MyChart en el transcurso de 1 a 2 das hbiles.  Para renovar recetas, por favor pida a su farmacia que se ponga en contacto con nuestra oficina. Nuestro nmero de fax es el [phone].  Si tiene un asunto urgente cuando la clnica est cerrada y que no puede esperar hasta el siguiente da hbil, puede llamar/localizar a su doctor(a) al nmero que aparece a continuacin.   Por favor, tenga en cuenta que aunque hacemos todo lo posible para estar disponibles para asuntos urgentes fuera del horario de oficina, no estamos disponibles las 24 horas del da, los 7 das de la semana.   Si tiene un problema urgente y no puede comunicarse con nosotros, puede optar por buscar atencin mdica  en el consultorio de su doctor(a), en una clnica privada, en un centro de atencin urgente o en una sala de emergencias.  Si tiene una emergencia mdica, por favor llame inmediatamente al 911 o vaya a la sala de emergencias.  Nmeros de bper  - Dr. Kowalski: [phone]  - Dra. Moye: [phone]  - Dra. Stewart: [phone]  En caso de inclemencias del tiempo, por favor llame a nuestra lnea principal al [phone] para una actualizacin sobre el estado de cualquier retraso o cierre.  Consejos para la medicacin en dermatologa: Por favor, guarde las cajas en las que vienen los medicamentos de uso tpico para ayudarle a seguir las instrucciones sobre dnde y cmo usarlos. Las farmacias generalmente imprimen las instrucciones del medicamento slo en las cajas y no directamente en los tubos del medicamento.   Si su medicamento es muy caro, por favor, pngase en contacto con nuestra oficina llamando al [phone] y presione la opcin 4 o envenos un mensaje a travs de MyChart.   No podemos decirle cul ser su copago por los medicamentos por adelantado ya que esto es diferente dependiendo de la cobertura de su seguro.  Sin embargo, es posible que podamos encontrar un medicamento sustituto a menor costo o llenar un formulario para que el seguro cubra el medicamento que se considera necesario.   Si se requiere una autorizacin previa para que su compaa de seguros cubra su medicamento, por favor permtanos de 1 a 2 das hbiles para completar este proceso.  Los precios de los medicamentos varan con frecuencia dependiendo del lugar de dnde se surte la receta y alguna farmacias pueden ofrecer precios ms baratos.  El sitio web www.goodrx.com tiene cupones para medicamentos de diferentes farmacias. Los precios aqu no tienen en cuenta lo que podra costar con la ayuda del seguro (puede ser ms barato con su seguro), pero el sitio web puede darle el precio si no utiliz ningn seguro.  - Puede imprimir el cupn correspondiente y llevarlo con su receta a la farmacia.  - Tambin puede pasar por nuestra oficina durante el horario de atencin regular y recoger una tarjeta de cupones de GoodRx.  -   Si necesita que su receta se enve electrnicamente a una farmacia diferente, informe a nuestra oficina a travs de MyChart de [facility] o por telfono llamando al [phone] y presione la opcin 4.  

## 2022-07-16 ENCOUNTER — Ambulatory Visit: Payer: PPO | Admitting: Family Medicine

## 2022-07-16 ENCOUNTER — Encounter: Payer: Self-pay | Admitting: Dermatology

## 2022-07-17 ENCOUNTER — Encounter: Payer: Self-pay | Admitting: Physician Assistant

## 2022-07-17 ENCOUNTER — Ambulatory Visit: Payer: Self-pay

## 2022-07-17 ENCOUNTER — Ambulatory Visit (INDEPENDENT_AMBULATORY_CARE_PROVIDER_SITE_OTHER): Payer: PPO | Admitting: Physician Assistant

## 2022-07-17 VITALS — BP 170/77 | Temp 97.6°F | Ht 74.0 in | Wt 247.0 lb

## 2022-07-17 DIAGNOSIS — L03115 Cellulitis of right lower limb: Secondary | ICD-10-CM

## 2022-07-17 DIAGNOSIS — S8011XA Contusion of right lower leg, initial encounter: Secondary | ICD-10-CM

## 2022-07-17 MED ORDER — AMOXICILLIN-POT CLAVULANATE 875-125 MG PO TABS: 1.0000 | ORAL_TABLET | Freq: Two times a day (BID) | ORAL | 0 refills | Status: AC

## 2022-07-17 NOTE — Progress Notes (Signed)
      Established patient visit   Patient: Jared Rice   DOB: 02-09-1933   89 y.o. Male  MRN: [id number] Visit Date: 07/17/2022  Today's healthcare provider: Mikey Kirschner, PA-C  Cc. Right lower leg pain and bruising  Subjective    HPI Pt reports a bruise without a known injury to his right lower leg. He is anticoagulated with eliquis. Reports significant pain at the site of injury. Denies additional swelling, discharge, numbness.  Medications: Outpatient Medications Prior to Visit  Medication Sig   apixaban (ELIQUIS) 5 MG TABS tablet Take 1 tablet by mouth twice a day   diphenhydramine-acetaminophen (TYLENOL PM) 25-500 MG TABS Take 2 tablets by mouth at bedtime.   doxazosin (CARDURA) 8 MG tablet Take 1 tablet by mouth once daily   finasteride (PROSCAR) 5 MG tablet Take 1 tablet (5 mg total) by mouth daily.   fluconazole (DIFLUCAN) 200 MG tablet Take 1 tablet (200 mg total) by mouth as directed. Take one tablet per week for 4 weeks   furosemide (LASIX) 40 MG tablet Take 1 tablet by mouth once daily (dose change)   hydrocortisone 2.5 % cream Apply topically 2 (two) times daily as needed (Rash).   levothyroxine (SYNTHROID) 88 MCG tablet Take 1 tablet by mouth daily in the morning before breakfast   ramipril (ALTACE) 5 MG capsule Take 1 capsule by mouth once daily   rosuvastatin (CRESTOR) 10 MG tablet Take 1 tablet by mouth once daily   topiramate (TOPAMAX) 50 MG tablet Take 1 tablet by mouth twice daily   No facility-administered medications prior to visit.       Objective    BP (!) 170/77 (BP Location: Left Arm, Patient Position: Sitting, Cuff Size: Normal)   Temp 97.6 F (36.4 C)   Ht 6' 2"$  (1.88 m)   Wt 247 lb (112 kg)   BMI 31.71 kg/m    Physical Exam Vitals reviewed.  Constitutional:      Appearance: He is not ill-appearing.  HENT:     Head: Normocephalic.  Eyes:     Conjunctiva/sclera: Conjunctivae normal.  Cardiovascular:     Rate and Rhythm: Normal  rate.  Pulmonary:     Effort: Pulmonary effort is normal. No respiratory distress.  Skin:    Comments: Dusky dry skin b/l, cool to touch There is a 4x4 ecchymosis very tender to touch on the right anterior lower extremity. Some associated erythema and local edema.   Neurological:     General: No focal deficit present.     Mental Status: He is alert and oriented to person, place, and time.  Psychiatric:        Mood and Affect: Mood normal.        Behavior: Behavior normal.      No results found for any visits on 07/17/22.  Assessment & Plan     Right lower extremity cellulitis associated with hematoma  Concern for infection given significant vascular disease and level of pain Rx augmentin 875 mg bid x 5 days Advised pt to watch for progressive signs of infection  F/u with pcp x 4 weeks  I, Mikey Kirschner, PA-C have reviewed all documentation for this visit. The documentation on  07/17/22 for the exam, diagnosis, procedures, and orders are all accurate and complete.  Mikey Kirschner, PA-C [facility] [address] [place], [place], [address] Office: [phone] Fax: [place]

## 2022-07-17 NOTE — Telephone Encounter (Signed)
     Chief Complaint: Right lower leg, inner aspect at calf. Bruised, very painful. Symptoms: Above Frequency: Yesterday Pertinent Negatives: Patient denies  Disposition: []$ ED /[]$ Urgent Care (no appt availability in office) / [x]$ Appointment(In office/virtual)/ []$  [facility]/ []$ Home Care/ []$ Refused Recommended Disposition /[]$ [facility]/ []$  Follow-up with PCP Additional Notes:   Reason for Disposition  [1] SEVERE pain (e.g., excruciating, unable to do any normal activities) AND [2] not improved after 2 hours of pain medicine  Answer Assessment - Initial Assessment Questions 1. ONSET: "When did the pain start?"      Yesterday 2. LOCATION: "Where is the pain located?"      Right calf, inner area 3. PAIN: "How bad is the pain?"    (Scale 1-10; or mild, moderate, severe)   -  MILD (1-3): doesn't interfere with normal activities    -  MODERATE (4-7): interferes with normal activities (e.g., work or school) or awakens from sleep, limping    -  SEVERE (8-10): excruciating pain, unable to do any normal activities, unable to walk     Severe 4. WORK OR EXERCISE: "Has there been any recent work or exercise that involved this part of the body?"      No 5. CAUSE: "What do you think is causing the leg pain?"     Unsure 6. OTHER SYMPTOMS: "Do you have any other symptoms?" (e.g., chest pain, back pain, breathing difficulty, swelling, rash, fever, numbness, weakness)     Bruised,swollen 7. PREGNANCY: "Is there any chance you are pregnant?" "When was your last menstrual period?"     N/a  Protocols used: Leg Pain-A-AH

## 2022-07-18 ENCOUNTER — Ambulatory Visit: Payer: PPO | Admitting: Family Medicine

## 2022-07-24 ENCOUNTER — Ambulatory Visit: Payer: Self-pay | Admitting: *Deleted

## 2022-07-24 NOTE — Telephone Encounter (Signed)
Message from Alm Bustard sent at 07/24/2022  1:26 PM EST  Summary: left leg pain   Jared Rice pt daughter states that he is still having left leg pain and he has finished taking the antibiotic. Jared Rice is wondering if pt need a CT scan to make sure it is not a blood clot in pt leg.  Please advise          Call History   Type Contact Phone/Fax User  07/24/2022 01:25 PM EST Phone (Incoming) Jared Rice [phone] Leanor Kail    Reason for Disposition  [1] Localized rash is very painful AND [2] no fever    Known cellulitis in left leg.   Just completed antibiotic from last week    No changes in leg.  Answer Assessment - Initial Assessment Questions 1. ONSET: "When did the pain start?"      I returned call to daughter Ileene Hutchinson.   Pt was in last week.   Given an antibiotic and finished it.    His left leg is still hurting.   He hopes it not a blood clot.    Nothing has changed with his leg.   I'm not sure if he should wait until Friday's appt.     2. LOCATION: "Where is the pain located?"      Left lower leg.    He doesn't remember things very well.   He has thin skin and he is on blood thinner.   His leg stays swollen anyway.     3. PAIN: "How bad is the pain?"    (Scale 1-10; or mild, moderate, severe)   -  MILD (1-3): doesn't interfere with normal activities    -  MODERATE (4-7): interferes with normal activities (e.g., work or school) or awakens from sleep, limping    -  SEVERE (8-10): excruciating pain, unable to do any normal activities, unable to walk     Moderate pain.    No change from when he was seen last week by Mikey Kirschner. 4. WORK OR EXERCISE: "Has there been any recent work or exercise that involved this part of the body?"      No 5. CAUSE: "What do you think is causing the leg pain?"     Cellulitis    This is a chronic problem for him for a while. 6. OTHER SYMPTOMS: "Do you have any other symptoms?" (e.g., chest pain, back pain, breathing difficulty, swelling, rash,  fever, numbness, weakness)     No changes in leg after taking the antibiotics than when he was seen last week in the office. 7. PREGNANCY: "Is there any chance you are pregnant?" "When was your last menstrual period?"     N/A  Protocols used: Leg Pain-A-AH

## 2022-07-24 NOTE — Telephone Encounter (Signed)
  Chief Complaint: Seen by Mikey Kirschner, PA-C on 07/17/2022 for cellulitis of leg.   Given antibiotic but leg is unchanged after completing the antibiotic.     Daughter Ileene Hutchinson has made him an appt. For this Friday July 26, 2022.    She wants to know if Mikey Kirschner wants to order a CT scan or anything before seeing him again on Friday.   Maybe r/o a blood clot.    He is on blood thinners. Symptoms: Leg is still swollen and sore, painful without change after completing the antibiotic given to him last week. Frequency: continues without changes Pertinent Negatives: Patient denies his leg being any better.    Disposition: []$ ED /[]$ Urgent Care (no appt availability in office) / []$ Appointment(In office/virtual)/ []$  [facility]/ []$ Home Care/ []$ Refused Recommended Disposition /[]$ [facility]/ [x]$  Follow-up with PCP Additional Notes: Message sent to Mikey Kirschner, PA-C regarding any scans or anything needed prior to his appt. On Friday.  Ileene Hutchinson, daughter agreeable to being called back at [phone].

## 2022-07-25 ENCOUNTER — Ambulatory Visit: Payer: PPO | Admitting: Urology

## 2022-07-25 ENCOUNTER — Telehealth: Payer: Self-pay

## 2022-07-25 ENCOUNTER — Encounter: Payer: Self-pay | Admitting: Urology

## 2022-07-25 VITALS — BP 95/61 | HR 96 | Ht 73.0 in | Wt 248.0 lb

## 2022-07-25 DIAGNOSIS — R350 Frequency of micturition: Secondary | ICD-10-CM | POA: Diagnosis not present

## 2022-07-25 DIAGNOSIS — N401 Enlarged prostate with lower urinary tract symptoms: Secondary | ICD-10-CM

## 2022-07-25 DIAGNOSIS — N433 Hydrocele, unspecified: Secondary | ICD-10-CM | POA: Diagnosis not present

## 2022-07-25 LAB — BLADDER SCAN AMB NON-IMAGING: Scan Result: 0

## 2022-07-25 MED ORDER — GEMTESA 75 MG PO TABS: 1.0000 | ORAL_TABLET | Freq: Every day | ORAL | 0 refills | Status: DC

## 2022-07-25 NOTE — Progress Notes (Signed)
07/25/2022 3:23 PM   [name] 1932/05/29 [id number]  Referring provider: Eulis Foster, MD [address] [place],  [place] [address]  Chief Complaint  Patient presents with   Benign Prostatic Hypertrophy    HPI: VENKAT Rice is a 89 y.o. male referred for evaluation of lower urinary tract symptoms.  Prior history of BPH and on doxazosin 8 mg and finasteride 5 mg for several years Recent onset of worsening lower urinary tract symptoms primarily urinary frequency and urgency.  Urgency is his most bothersome symptom Rare episodes of small-volume urge incontinence He has nocturia x 1 IPSS 9/35    Denies dysuria, gross hematuria No flank, abdominal or pelvic pain  PMH: Past Medical History:  Diagnosis Date   Actinic keratosis    Anginal pain (HCC)    Aortic atherosclerosis (HCC)    Arthritis    Atrial fibrillation ([facility])    Basal cell carcinoma 02/06/2010   Left shoulder. Superficial.   Basal cell carcinoma 10/11/2013   Right medial forearm. Superficial   Basal cell carcinoma 04/10/2015   Right inf. lat. thigh. Superficial   Basal cell carcinoma 12/09/2016   Right cheek. Superficial.   Chronic airway obstruction, not elsewhere classified    Colon polyps    Complication of anesthesia    SPINAL DID NOT WORK FOR LAST KNEE REPLACEMENT AND HAD TO HAVE GA   Coronary atherosclerosis of native coronary artery    nonobstructive   Diabetes mellitus without complication (HCC)    Essential hypertension, benign    GERD (gastroesophageal reflux disease)    H/O   Glucose intolerance (impaired glucose tolerance)    Heart murmur    History of kidney stones    Hyperlipidemia    Hypothyroidism    Iron deficiency anemia    Knee pain, right    LBBB (left bundle branch block)    Occlusion and stenosis of carotid artery without mention of cerebral infarction    wears compression stockings   Other dyspnea and respiratory abnormality    w/  pseuodowheeze resolves with purse lip manuever   Precordial pain    Shoulder pain, right    Squamous cell carcinoma of skin 10/06/2007   Right forearm. SCCis   Squamous cell carcinoma of skin 10/11/2013   Left mid lat. pretibial. KA-like pattern.   Tremor    Unspecified sleep apnea    HAS NOT USED CPAP IN 20 YEARS   Venous insufficiency    Wears dentures    full upper   Wound cellulitis- right lower leg anterior  10/29/2019    Surgical History: Past Surgical History:  Procedure Laterality Date   CARDIAC CATHETERIZATION     X2   COLONOSCOPY  03/17/12   Dr Byrnett-diverticulosis   ESOPHAGOGASTRODUODENOSCOPY (EGD) WITH PROPOFOL N/A 11/25/2014   Procedure: ESOPHAGOGASTRODUODENOSCOPY (EGD) WITH PROPOFOL;  Surgeon: Lucilla Lame, MD;  Location: [facility];  Service: Endoscopy;  Laterality: N/A;  with biopsy   ESOPHAGOGASTRODUODENOSCOPY (EGD) WITH PROPOFOL N/A 01/05/2015   Procedure: ESOPHAGOGASTRODUODENOSCOPY (EGD) WITH PROPOFOL;  Surgeon: Lucilla Lame, MD;  Location: [facility];  Service: Endoscopy;  Laterality: N/A;   EYE SURGERY Bilateral    cataract    KNEE ARTHROPLASTY Right 09/06/2020   Procedure: COMPUTER ASSISTED TOTAL KNEE ARTHROPLASTY;  Surgeon: Dereck Leep, MD;  Location: [facility];  Service: Orthopedics;  Laterality: Right;   TOTAL KNEE ARTHROPLASTY Left 2012    Home Medications:  Allergies as of 07/25/2022  Reactions   Dexilant [dexlansoprazole] Rash   Primidone Itching, Rash        Medication List        Accurate as of July 25, 2022  3:23 PM. If you have any questions, ask your nurse or doctor.          STOP taking these medications    fluconazole 200 MG tablet Commonly known as: DIFLUCAN Stopped by: Abbie Sons, MD       TAKE these medications    diphenhydramine-acetaminophen 25-500 MG Tabs tablet Commonly known as: TYLENOL PM Take 2 tablets by mouth at bedtime.   doxazosin 8 MG tablet Commonly known as:  CARDURA Take 1 tablet by mouth once daily   Eliquis 5 MG Tabs tablet Generic drug: apixaban Take 1 tablet by mouth twice a day   finasteride 5 MG tablet Commonly known as: Proscar Take 1 tablet (5 mg total) by mouth daily.   furosemide 40 MG tablet Commonly known as: LASIX Take 1 tablet by mouth once daily (dose change)   hydrocortisone 2.5 % cream Apply topically 2 (two) times daily as needed (Rash).   levothyroxine 88 MCG tablet Commonly known as: SYNTHROID Take 1 tablet by mouth daily in the morning before breakfast   ramipril 5 MG capsule Commonly known as: ALTACE Take 1 capsule by mouth once daily   rosuvastatin 10 MG tablet Commonly known as: CRESTOR Take 1 tablet by mouth once daily   topiramate 50 MG tablet Commonly known as: TOPAMAX Take 1 tablet by mouth twice daily        Allergies:  Allergies  Allergen Reactions   Dexilant [Dexlansoprazole] Rash   Primidone Itching and Rash    Family History: Family History  Problem Relation Age of Onset   Heart failure Mother 59       congestive   Heart attack Father 42   Alcohol abuse Father    Alzheimer's disease Brother 7   Alzheimer's disease Sister 46   Colon cancer Neg Hx    Liver disease Neg Hx     Social History:  reports that he quit smoking about 41 years ago. His smoking use included cigarettes. He has a 62.00 pack-year smoking history. He has never used smokeless tobacco. He reports that he does not drink alcohol and does not use drugs.   Physical Exam: BP 95/61   Pulse 96   Ht 6' 1"$  (1.854 m)   Wt 248 lb (112.5 kg)   BMI 32.72 kg/m   Constitutional:  Alert, No acute distress. HEENT: [place] AT Respiratory: Normal respiratory effort, no increased work of breathing. GU: Prostate 40 g, smooth without nodules.  Large right hydrocele Skin: No rashes, bruises or suspicious lesions. Neurologic: Grossly intact, no focal deficits, moving all 4 extremities. Psychiatric: Normal mood and  affect.   Assessment & Plan:    1. Benign prostatic hyperplasia with LUTS Bothersome urinary frequency and urgency.  He has no obstructive voiding symptoms His symptoms are bothersome enough that he desires additional medical management and he was given samples of Gemtesa 75 mg daily x 28 days PA follow-up 1 month for symptom recheck.  If his symptoms are significantly improved he may call the office for refill and will schedule follow-up appointment in 6 months PVR today 0 mL  2.  Right hydrocele Large right hydrocele.  He has no bothersome symptoms He did undergo a right hydrocelectomy by Dr. Elnoria Howard in 2015   Abbie Sons, [facility] [address] [place], [place] [address] [phone]

## 2022-07-25 NOTE — Telephone Encounter (Signed)
Copied from [place] [id number]. Topic: General - Inquiry >> Jul 25, 2022  8:37 AM Ludger Nutting wrote: Patient's daughter called to get an update on request for a CT on patient's leg since there has been no improvement after taking antibiotics from visit on 2/21. Ileene Hutchinson stated that patient will be at [facility] for another appointment today and wanted to get the CT done while already there. Please follow up with Ileene Hutchinson [phone].

## 2022-07-25 NOTE — Telephone Encounter (Signed)
Duplicate now. Pt has appt sched tom

## 2022-07-26 ENCOUNTER — Ambulatory Visit (INDEPENDENT_AMBULATORY_CARE_PROVIDER_SITE_OTHER): Payer: PPO | Admitting: Physician Assistant

## 2022-07-26 ENCOUNTER — Encounter: Payer: Self-pay | Admitting: Physician Assistant

## 2022-07-26 VITALS — BP 116/59 | HR 77 | Temp 97.6°F | Resp 16 | Wt 243.0 lb

## 2022-07-26 DIAGNOSIS — S8011XD Contusion of right lower leg, subsequent encounter: Secondary | ICD-10-CM | POA: Diagnosis not present

## 2022-07-26 DIAGNOSIS — L03115 Cellulitis of right lower limb: Secondary | ICD-10-CM | POA: Diagnosis not present

## 2022-07-26 NOTE — Progress Notes (Signed)
I,Sulibeya S Dimas,acting as a Education administrator for [employer], PA-C.,have documented all relevant documentation on the behalf of Mikey Kirschner, PA-C,as directed by  Mikey Kirschner, PA-C while in the presence of Mikey Kirschner, PA-C.     Established patient visit   Patient: Jared Rice   DOB: 18-Mar-1933   89 y.o. Male  MRN: [id number] Visit Date: 07/26/2022  Today's healthcare provider: Mikey Kirschner, PA-C   Chief Complaint  Patient presents with   Follow-up   Subjective    HPI  Follow up for cellulitis of right lower extremity   The patient was last seen for this 9 days ago. Changes made at last visit include start Augmentin 875 mg BID x 5 days. [name]  He reports excellent compliance with treatment. Treatment completed. He feels that condition is Unchanged. He reports redness, swelling and pain with touch.  He is not having side effects.   Pt presents with wife today. His daughter has called concerned over a blood clot. -----------------------------------------------------------------------------------------   Medications: Outpatient Medications Prior to Visit  Medication Sig   apixaban (ELIQUIS) 5 MG TABS tablet Take 1 tablet by mouth twice a day   diphenhydramine-acetaminophen (TYLENOL PM) 25-500 MG TABS Take 2 tablets by mouth at bedtime.   doxazosin (CARDURA) 8 MG tablet Take 1 tablet by mouth once daily   finasteride (PROSCAR) 5 MG tablet Take 1 tablet (5 mg total) by mouth daily.   furosemide (LASIX) 40 MG tablet Take 1 tablet by mouth once daily (dose change)   hydrocortisone 2.5 % cream Apply topically 2 (two) times daily as needed (Rash).   levothyroxine (SYNTHROID) 88 MCG tablet Take 1 tablet by mouth daily in the morning before breakfast   ramipril (ALTACE) 5 MG capsule Take 1 capsule by mouth once daily   rosuvastatin (CRESTOR) 10 MG tablet Take 1 tablet by mouth once daily   topiramate (TOPAMAX) 50 MG tablet Take 1 tablet by mouth twice daily   Vibegron  (GEMTESA) 75 MG TABS Take 1 tablet (75 mg total) by mouth daily.   No facility-administered medications prior to visit.    Review of Systems  Constitutional:  Positive for fever. Negative for chills.  Respiratory:  Negative for cough and shortness of breath.   Cardiovascular:  Positive for leg swelling.  Skin:  Positive for color change and rash.     Objective    BP (!) 116/59 (BP Location: Left Arm, Patient Position: Sitting, Cuff Size: Large)   Pulse 77   Temp 97.6 F (36.4 C) (Temporal)   Resp 16   Wt 243 lb (110.2 kg)   SpO2 97%   BMI 32.06 kg/m    Physical Exam Skin:    Comments: Improved edema of RLE Chronic dusky erythema and trace edema is still present RLE the area of injury is smaller but now more raised and concentrated.  Still tender to touch.      No results found for any visits on 07/26/22.  Assessment & Plan     RLE hematoma, cellulitis Improving Recommended keeping feet elevated  Again gave ED precautions Low suspicion for DVT given Eliquis and symptoms Patient was also evaluated by Dr Caryn Section who agreed with plan  Return if symptoms worsen or fail to improve.      I, Mikey Kirschner, PA-C have reviewed all documentation for this visit. The documentation on  07/26/22 for the exam, diagnosis, procedures, and orders are all accurate and complete.  Mikey Kirschner, PA-C [facility] [address] [place], [place], [address] Office: [phone] Fax: [place]

## 2022-08-01 ENCOUNTER — Other Ambulatory Visit: Payer: Self-pay | Admitting: Family Medicine

## 2022-08-01 ENCOUNTER — Ambulatory Visit: Payer: Self-pay

## 2022-08-01 NOTE — Telephone Encounter (Signed)
Unable to refill per protocol, Rx request is too soon. Last refill 07/01/22 for 90 days.  Requested Prescriptions  Pending Prescriptions Disp Refills   finasteride (PROSCAR) 5 MG tablet [Pharmacy Med Name: Finasteride 5 MG Oral Tablet] 90 tablet 0    Sig: Take 1 tablet by mouth once daily     Urology: 5-alpha Reductase Inhibitors Passed - 08/01/2022 10:51 AM      Passed - PSA in normal range and within 360 days    PSA  Date Value Ref Range Status  04/14/2014 1.6  Final   Prostate Specific Ag, Serum  Date Value Ref Range Status  07/01/2022 3.5 0.0 - 4.0 ng/mL Final    Comment:    Roche ECLIA methodology. According to the American Urological Association, Serum PSA should decrease and remain at undetectable levels after radical prostatectomy. The AUA defines biochemical recurrence as an initial PSA value 0.2 ng/mL or greater followed by a subsequent confirmatory PSA value 0.2 ng/mL or greater. Values obtained with different assay methods or kits cannot be used interchangeably. Results cannot be interpreted as absolute evidence of the presence or absence of malignant disease.          Passed - Valid encounter within last 12 months    Recent Outpatient Visits           6 days ago Cellulitis of right lower extremity   [place] Mikey Kirschner, PA-C   2 weeks ago Cellulitis of right lower extremity   [place] Mikey Kirschner, PA-C   1 month ago Frequent UTI   [place] Rory Percy M, DO   1 month ago Benign prostatic hyperplasia with urinary frequency   [place] Simmons-Robinson, [place], MD   4 months ago Cellulitis of right lower extremity   [place], Donald E, MD       Future Appointments             In 1 week Simmons-Robinson, Riki Sheer, MD [facility], [place]   In 2 weeks McGowan, Gordan Payment [facility] [place]   In 2 months Ralene Bathe, MD [place]

## 2022-08-01 NOTE — Telephone Encounter (Signed)
  Chief Complaint: Medication Assistance  Symptoms: NA Frequency: today Pertinent Negatives: Patient denies any sx Disposition: '[]'$ ED /'[]'$ Urgent Care (no appt availability in office) / '[]'$ Appointment(In office/virtual)/ '[]'$  [facility]/ '[x]'$ Home Care/ '[]'$ Refused Recommended Disposition /'[]'$ [facility]/ '[]'$  Follow-up with PCP Additional Notes: pt states he was unsure what Finasteride medication was for and forgot to start taking it. Asked if he needed to start taking. Reviewed OV notes from Dr. Ky Barban and Urology provider and both recommended pt taking so advised he is suppose to be taking once daily. Pt verbalized understanding and will start taking today.   Summary: Pt has questions about a medication   Pt has questions about a medication that Dr. Ky Barban prescribed. Pt requests call back to discuss. Cb# [phone]     Reason for Disposition  Caller has medicine question only, adult not sick, AND triager answers question  Answer Assessment - Initial Assessment Questions 1. NAME of MEDICINE: "What medicine(s) are you calling about?"     Finasteride '5mg'$   2. QUESTION: "What is your question?" (e.g., double dose of medicine, side effect)     Wanting to know if suppose to be taking since forgot to start  3. PRESCRIBER: "Who prescribed the medicine?" Reason: if prescribed by specialist, call should be referred to that group.     Dr. Ky Barban 4. SYMPTOMS: "Do you have any symptoms?" If Yes, ask: "What symptoms are you having?"  "How bad are the symptoms (e.g., mild, moderate, severe)     Not currently  Protocols used: Medication Question Call-A-AH

## 2022-08-05 ENCOUNTER — Other Ambulatory Visit: Payer: Self-pay | Admitting: Family Medicine

## 2022-08-05 DIAGNOSIS — E039 Hypothyroidism, unspecified: Secondary | ICD-10-CM

## 2022-08-13 NOTE — Progress Notes (Unsigned)
I,Joseline E Rosas,acting as a scribe for [employer], MD.,have documented all relevant documentation on the behalf of Jared Foster, MD,as directed by  Jared Foster, MD while in the presence of Jared Foster, MD.   Established patient visit   Patient: Jared Rice   DOB: 1932-10-17   89 y.o. Male  MRN: [id number] Visit Date: 08/14/2022  Today's healthcare provider: Eulis Foster, MD   Chief Complaint  Patient presents with   Follow-up   Cellulitis   Subjective    HPI  Patient here for 1 month follow-up Hypertension. Blood Pressure are not being checked. Symptoms: No chest pain No chest pressure  No palpitations No syncope  No dyspnea No orthopnea  No paroxysmal nocturnal dyspnea Yes lower extremity edema   Pertinent labs Lab Results  Component Value Date   CHOL 122 06/05/2022   HDL 44 06/05/2022   LDLCALC 68 06/05/2022   TRIG 39 06/05/2022   CHOLHDL 2.8 06/05/2022   Lab Results  Component Value Date   NA 145 (H) 06/05/2022   K 4.3 06/05/2022   CREATININE 1.17 06/05/2022   EGFR 60 06/05/2022   GLUCOSE 97 06/05/2022   TSH 4.640 (H) 06/05/2022     The ASCVD Risk score (Arnett DK, et al., 2019) failed to calculate for the following reasons:   The 2019 ASCVD risk score is only valid for ages 1 to 45   Diarrhea: This has resolved. Patient reports that having normal bowel movements, twice a day.  Leg Swelling: Patient also concern about cellulitis/ redness in lower extremities. He reports that about a week ago he hit/bump right lower leg on toilet. Skin is not hot or painful. No sores/boils. He was evaluated for cellulitis on 07/30/22  Medications: Outpatient Medications Prior to Visit  Medication Sig   apixaban (ELIQUIS) 5 MG TABS tablet Take 1 tablet by mouth twice a day   diphenhydramine-acetaminophen (TYLENOL PM) 25-500 MG TABS Take 2 tablets by mouth at bedtime.   doxazosin (CARDURA) 8 MG tablet  Take 1 tablet by mouth once daily   furosemide (LASIX) 40 MG tablet Take 1 tablet by mouth once daily (dose change)   levothyroxine (SYNTHROID) 88 MCG tablet TAKE 1 TABLET BY MOUTH ONCE DAILY IN THE MORNING BEFORE BREAKFAST   ramipril (ALTACE) 5 MG capsule Take 1 capsule by mouth once daily   rosuvastatin (CRESTOR) 10 MG tablet Take 1 tablet by mouth once daily   topiramate (TOPAMAX) 50 MG tablet Take 1 tablet by mouth twice daily   finasteride (PROSCAR) 5 MG tablet Take 1 tablet (5 mg total) by mouth daily.   Vibegron (GEMTESA) 75 MG TABS Take 1 tablet (75 mg total) by mouth daily.   [DISCONTINUED] hydrocortisone 2.5 % cream Apply topically 2 (two) times daily as needed (Rash).   No facility-administered medications prior to visit.    Review of Systems     Objective    BP 134/76 (BP Location: Left Arm, Patient Position: Sitting, Cuff Size: Large)   Pulse 72   Resp 12   Wt 244 lb 1.6 oz (110.7 kg)   SpO2 94%   BMI 32.21 kg/m    Physical Exam Vitals reviewed.  Constitutional:      General: He is not in acute distress.    Appearance: Normal appearance. He is not ill-appearing, toxic-appearing or diaphoretic.  Eyes:     Conjunctiva/sclera: Conjunctivae normal.  Neck:     Thyroid: No thyroid mass, thyromegaly or thyroid tenderness.  Vascular: No carotid bruit.  Cardiovascular:     Rate and Rhythm: Normal rate and regular rhythm.     Pulses: Normal pulses.     Heart sounds: Normal heart sounds. No murmur heard.    No friction rub. No gallop.  Pulmonary:     Effort: Pulmonary effort is normal. No respiratory distress.     Breath sounds: Normal breath sounds. No stridor. No wheezing, rhonchi or rales.  Abdominal:     General: Bowel sounds are normal. There is no distension.     Palpations: Abdomen is soft.     Tenderness: There is no abdominal tenderness.  Musculoskeletal:     Right lower leg: Tenderness present. 2+ Pitting Edema present.     Left lower leg: Tenderness  present. 2+ Pitting Edema present.       Legs:     Comments: Nodule on right shin anterior, tender to palpation    Lymphadenopathy:     Cervical: No cervical adenopathy.  Skin:    Findings: No erythema or rash.  Neurological:     Mental Status: He is alert and oriented to person, place, and time.       No results found for any visits on 08/14/22.  Assessment & Plan     Problem List Items Addressed This Visit       Cardiovascular and Mediastinum   HYPERTENSION, BENIGN - Primary    Controlled BP at goal Continue current medications at current doses No medications changes today          Other   Diarrhea    Improved  No further recommendations       Localized swelling of left lower extremity    Right lower extremity with hematoma, venous stasis changes, no warmth to touch  Recommended [place] of right lower extremity  Considered dvt however patient is adherent to eliquis BID        Relevant Orders   US Venous Img Lower Unilateral Right     Return in about 4 months (around 12/14/2022).        The entirety of the information documented in the History of Present Illness, Review of Systems and Physical Exam were personally obtained by me. Portions of this information were initially documented by Lyndel Pleasure, CMA. I, Jared Foster, MD have reviewed the documentation above for thoroughness and accuracy.      Jared Foster, MD  [facility] [phone] (phone) [phone] (fax)  [place]

## 2022-08-14 ENCOUNTER — Ambulatory Visit (INDEPENDENT_AMBULATORY_CARE_PROVIDER_SITE_OTHER): Payer: PPO | Admitting: Family Medicine

## 2022-08-14 ENCOUNTER — Encounter: Payer: Self-pay | Admitting: Family Medicine

## 2022-08-14 VITALS — BP 134/76 | HR 72 | Resp 12 | Wt 244.1 lb

## 2022-08-14 DIAGNOSIS — R2242 Localized swelling, mass and lump, left lower limb: Secondary | ICD-10-CM | POA: Diagnosis not present

## 2022-08-14 DIAGNOSIS — I10 Essential (primary) hypertension: Secondary | ICD-10-CM | POA: Diagnosis not present

## 2022-08-14 DIAGNOSIS — R197 Diarrhea, unspecified: Secondary | ICD-10-CM | POA: Diagnosis not present

## 2022-08-14 NOTE — Assessment & Plan Note (Signed)
Improved  No further recommendations

## 2022-08-14 NOTE — Patient Instructions (Signed)
It was a pleasure to see you today!  Thank you for choosing [facility] for your primary care.   Jared Rice was seen for blood pressure and leg swelling.   Our plans for today were: Today we are going to order an ultrasound to check the swelling in your leg. Someone will call to schedule an appointment with you.  Your blood pressure is improved so please continue your current medications. No changes today.    You should return to our clinic in 4 months for chronic conditions follow up.   Best Wishes,   Dr. Quentin Cornwall

## 2022-08-14 NOTE — Assessment & Plan Note (Signed)
Right lower extremity with hematoma, venous stasis changes, no warmth to touch  Recommended [place] of right lower extremity  Considered dvt however patient is adherent to eliquis BID

## 2022-08-14 NOTE — Assessment & Plan Note (Signed)
Controlled BP at goal Continue current medications at current doses No medications changes today   

## 2022-08-19 NOTE — Progress Notes (Unsigned)
08/20/2022 2:47 PM   Jared Rice May 14, 1933 [id number]  Referring provider: Eulis Foster, MD [address] [place],  [place] [address]  Urological history 1. BPH with LU TS -PSA (06/2022) 3.5  -Doxazosin 8 mg daily and finasteride 5 mg daily  2.  Right hydrocele -Right hydrocelectomy (2015)  Chief Complaint  Patient presents with   Other    IPSS and PVR    HPI: Jared Rice is a 89 y.o. male referred for 1 month follow-up after trial of Gemtesa for frequency and urgency.  I PSS 15/2  PVR 260 mL   He states he is doing well, but he did not take the Gentamicin samples that were given to him.  Patient denies any modifying or aggravating factors.  Patient denies any gross hematuria, dysuria or suprapubic/flank pain.  Patient denies any fevers, chills, nausea or vomiting.     IPSS     Row Name 08/20/22 1400         International Prostate Symptom Score   How often have you had the sensation of not emptying your bladder? More than half the time     How often have you had to urinate less than every two hours? Less than half the time     How often have you found you stopped and started again several times when you urinated? Less than 1 in 5 times     How often have you found it difficult to postpone urination? Almost always     How often have you had a weak urinary stream? Less than 1 in 5 times     How often have you had to strain to start urination? Less than 1 in 5 times     How many times did you typically get up at night to urinate? 1 Time     Total IPSS Score 15       Quality of Life due to urinary symptoms   If you were to spend the rest of your life with your urinary condition just the way it is now how would you feel about that? Mostly Satisfied              Score:  1-7 Mild 8-19 Moderate 20-35 Severe    PMH: Past Medical History:  Diagnosis Date   Actinic keratosis    Anginal pain (HCC)    Aortic atherosclerosis  (HCC)    Arthritis    Atrial fibrillation ([place])    Basal cell carcinoma 02/06/2010   Left shoulder. Superficial.   Basal cell carcinoma 10/11/2013   Right medial forearm. Superficial   Basal cell carcinoma 04/10/2015   Right inf. lat. thigh. Superficial   Basal cell carcinoma 12/09/2016   Right cheek. Superficial.   Chronic airway obstruction, not elsewhere classified    Colon polyps    Complication of anesthesia    SPINAL DID NOT WORK FOR LAST KNEE REPLACEMENT AND HAD TO HAVE GA   Coronary atherosclerosis of native coronary artery    nonobstructive   Diabetes mellitus without complication (HCC)    Essential hypertension, benign    GERD (gastroesophageal reflux disease)    H/O   Glucose intolerance (impaired glucose tolerance)    Heart murmur    History of kidney stones    Hyperlipidemia    Hypothyroidism    Iron deficiency anemia    Knee pain, right    LBBB (left bundle branch block)    Occlusion and stenosis of carotid artery without  mention of cerebral infarction    wears compression stockings   Other dyspnea and respiratory abnormality    w/ pseuodowheeze resolves with purse lip manuever   Precordial pain    Shoulder pain, right    Squamous cell carcinoma of skin 10/06/2007   Right forearm. SCCis   Squamous cell carcinoma of skin 10/11/2013   Left mid lat. pretibial. KA-like pattern.   Tremor    Unspecified sleep apnea    HAS NOT USED CPAP IN 20 YEARS   Venous insufficiency    Wears dentures    full upper   Wound cellulitis- right lower leg anterior  10/29/2019    Surgical History: Past Surgical History:  Procedure Laterality Date   CARDIAC CATHETERIZATION     X2   COLONOSCOPY  03/17/12   Dr Byrnett-diverticulosis   ESOPHAGOGASTRODUODENOSCOPY (EGD) WITH PROPOFOL N/A 11/25/2014   Procedure: ESOPHAGOGASTRODUODENOSCOPY (EGD) WITH PROPOFOL;  Surgeon: Jared Lame, MD;  Location: [facility];  Service: Endoscopy;  Laterality: N/A;  with biopsy    ESOPHAGOGASTRODUODENOSCOPY (EGD) WITH PROPOFOL N/A 01/05/2015   Procedure: ESOPHAGOGASTRODUODENOSCOPY (EGD) WITH PROPOFOL;  Surgeon: Jared Lame, MD;  Location: [facility];  Service: Endoscopy;  Laterality: N/A;   EYE SURGERY Bilateral    cataract    KNEE ARTHROPLASTY Right 09/06/2020   Procedure: COMPUTER ASSISTED TOTAL KNEE ARTHROPLASTY;  Surgeon: Jared Leep, MD;  Location: [facility];  Service: Orthopedics;  Laterality: Right;   TOTAL KNEE ARTHROPLASTY Left 2012    Home Medications:  Allergies as of 08/20/2022       Reactions   Dexilant [dexlansoprazole] Rash   Primidone Itching, Rash        Medication List        Accurate as of August 20, 2022  2:47 PM. If you have any questions, ask your nurse or doctor.          STOP taking these medications    finasteride 5 MG tablet Commonly known as: Proscar Stopped by: Laytoya Ion, PA-C   Gemtesa 75 MG Tabs Generic drug: Vibegron Stopped by: Zara Council, PA-C       TAKE these medications    diphenhydramine-acetaminophen 25-500 MG Tabs tablet Commonly known as: TYLENOL PM Take 2 tablets by mouth at bedtime.   doxazosin 8 MG tablet Commonly known as: CARDURA Take 1 tablet by mouth once daily   Eliquis 5 MG Tabs tablet Generic drug: apixaban Take 1 tablet by mouth twice a day   furosemide 40 MG tablet Commonly known as: LASIX Take 1 tablet by mouth once daily (dose change)   levothyroxine 88 MCG tablet Commonly known as: SYNTHROID TAKE 1 TABLET BY MOUTH ONCE DAILY IN THE MORNING BEFORE BREAKFAST   ramipril 5 MG capsule Commonly known as: ALTACE Take 1 capsule by mouth once daily   rosuvastatin 10 MG tablet Commonly known as: CRESTOR Take 1 tablet by mouth once daily   topiramate 50 MG tablet Commonly known as: TOPAMAX Take 1 tablet by mouth twice daily        Allergies:  Allergies  Allergen Reactions   Dexilant [Dexlansoprazole] Rash   Primidone Itching and Rash    Family  History: Family History  Problem Relation Age of Onset   Heart failure Mother 20       congestive   Heart attack Father 52   Alcohol abuse Father    Alzheimer's disease Brother 79   Alzheimer's disease Sister 45   Colon cancer Neg Hx    Liver disease  Neg Hx     Social History:  reports that he quit smoking about 41 years ago. His smoking use included cigarettes. He has a 62.00 pack-year smoking history. He has never used smokeless tobacco. He reports that he does not drink alcohol and does not use drugs.   Physical Exam: BP 116/72   Pulse 92   Ht 6\' 1"  (1.854 m)   Wt 240 lb (108.9 kg)   BMI 31.66 kg/m   Constitutional:  Well nourished. Alert and oriented, No acute distress. HEENT: [place] AT, moist mucus membranes.  Trachea midline Cardiovascular: No clubbing, cyanosis, or edema. Respiratory: Normal respiratory effort, no increased work of breathing. Neurologic: Grossly intact, no focal deficits, moving all 4 extremities. Psychiatric: Normal mood and affect.   Assessment & Plan:    1. Benign prostatic hyperplasia with LUTS -Continue doxazosin and finasteride  2. Urgency -He did not take the Gemtesa samples -We discussed his urinary symptoms and he states they are slightly bothersome to him, so he will go ahead and start the samples  3. Right hydrocele -Large right hydrocele.  He has no bothersome symptoms  Return in about 3 weeks (around 09/10/2022) for IPSS and PVR.   Andie Mortimer, [place] [address] [place], [place] [address] [phone]

## 2022-08-20 ENCOUNTER — Ambulatory Visit (INDEPENDENT_AMBULATORY_CARE_PROVIDER_SITE_OTHER): Payer: PPO | Admitting: Urology

## 2022-08-20 ENCOUNTER — Encounter: Payer: Self-pay | Admitting: Urology

## 2022-08-20 VITALS — BP 116/72 | HR 92 | Ht 73.0 in | Wt 240.0 lb

## 2022-08-20 DIAGNOSIS — R350 Frequency of micturition: Secondary | ICD-10-CM | POA: Diagnosis not present

## 2022-08-20 DIAGNOSIS — N433 Hydrocele, unspecified: Secondary | ICD-10-CM | POA: Diagnosis not present

## 2022-08-20 DIAGNOSIS — R3915 Urgency of urination: Secondary | ICD-10-CM

## 2022-08-20 DIAGNOSIS — N401 Enlarged prostate with lower urinary tract symptoms: Secondary | ICD-10-CM

## 2022-08-20 LAB — BLADDER SCAN AMB NON-IMAGING

## 2022-08-27 ENCOUNTER — Telehealth: Payer: Self-pay

## 2022-08-27 NOTE — Progress Notes (Signed)
Care Management & Coordination Services Pharmacy Team Pharmacy Assistant   Name: Jared Rice  MRN: [id number] DOB: 04/15/1933  Reason for Encounter: Hypertension  Contacted patient to discuss hypertension disease state. Spoke with patient on 08/29/2022   Chart Updates: Recent office visits:  08/14/2022 Ronnald Ramp, MD (PCP Office Visit) for Follow-up- Stopped: Hydrocortisone 2.5%, US Venous lmg Lower Unilateral Right, Patient to follow-up in 4 months  07/26/2022 Tommi Rumps, PA-C (PCP Office Visit) for Follow-up- No medication changes noted, No orders placed, No follow-up noted  07/17/2022 Alfredia Ferguson, PA-C (PCP Office Visit) for Cellulitis- Started: Amoxicillin-Pot Clavulanate 875-125 mg twice daily, No orders placed, BP: 170/77, Patient to follow-up in 4 weeks  07/01/2022 Ellwood Dense, DO (PCP Office Visit) for Frequent UTI- Started: Finasteride 5 mg daily, Lab orders placed, BP: 143/78, No follow-up noted  06/12/2022 Ronnald Ramp, MD (PCP Office Visit) for Annual Exam- Stopped: Clotrimazole-Betamethasone, Hydrocortisone, Triamcinolone cream due to patient not using, Changed: Levothyroxine Sodium 88 mcg 1 tablet daily this was an increase, Referral to Urology placed, BP: 148/88, Patient to follow-up in 1 month  06/06/2022 Lanier Ensign, LPN (PCP Clinical Support) for Medicare Annual Wellness Exam- Stopped: Cetirizine, Tramadol, No orders placed,  Recent consult visits:  08/20/2022 Michiel Cowboy, PA-C (Urology) for Follow-up- Stopped: Vibegron 75 mg daily, Finasteride 5 mg, Bladder scan, 1 month follow-up after trial of Gemtesa  07/25/2022 Irineo Axon, MD (Urology) for Benign Prostatic- Started: Vibegron 75 mg daily, Stopped: Fluconazole due to patient completing, Bladder Scan ordered,  Patient to follow-up in 1 month  07/15/2022 Armida Sans, MD (Dermatology) for Annual Exam- Started: Fluconazole 200 mg 1 tablet for week for one week, Destruction  of lesion placed, BP: 166/93, Patient to follow-up in 3 months.  06/28/2022 Shary Decamp, DPM (Podiatry) for Toe Pain- No medication changes noted, No orders placed, Patient to follow-up in 3 months.   06/24/2022 Robet Leu, PA-C (Cardiology) for Follow-up- Stopped: Potassium-Chloride 20 mEq, No ordes placed, BP: 130/72, Patient to follow-up in 1 month  Hospital visits:  None in previous 6 months  Medications: Outpatient Encounter Medications as of 08/27/2022  Medication Sig   apixaban (ELIQUIS) 5 MG TABS tablet Take 1 tablet by mouth twice a day   diphenhydramine-acetaminophen (TYLENOL PM) 25-500 MG TABS Take 2 tablets by mouth at bedtime.   doxazosin (CARDURA) 8 MG tablet Take 1 tablet by mouth once daily   furosemide (LASIX) 40 MG tablet Take 1 tablet by mouth once daily (dose change)   levothyroxine (SYNTHROID) 88 MCG tablet TAKE 1 TABLET BY MOUTH ONCE DAILY IN THE MORNING BEFORE BREAKFAST   ramipril (ALTACE) 5 MG capsule Take 1 capsule by mouth once daily   rosuvastatin (CRESTOR) 10 MG tablet Take 1 tablet by mouth once daily   topiramate (TOPAMAX) 50 MG tablet Take 1 tablet by mouth twice daily   No facility-administered encounter medications on file as of 08/27/2022.    Recent Office Vitals: BP Readings from Last 3 Encounters:  08/20/22 116/72  08/14/22 134/76  07/26/22 (!) 116/59   Pulse Readings from Last 3 Encounters:  08/20/22 92  08/14/22 72  07/26/22 77    Wt Readings from Last 3 Encounters:  08/20/22 240 lb (108.9 kg)  08/14/22 244 lb 1.6 oz (110.7 kg)  07/26/22 243 lb (110.2 kg)    Kidney Function Lab Results  Component Value Date/Time   CREATININE 1.17 06/05/2022 10:21 AM   CREATININE 1.27 04/26/2022 03:45 PM   CREATININE 0.87 10/13/2013 10:55 AM  GFRNONAA >60 08/24/2020 12:18 PM   GFRNONAA >60 10/13/2013 10:55 AM   GFRAA 74 07/05/2020 10:31 AM   GFRAA >60 10/13/2013 10:55 AM      Latest Ref Rng & Units 06/05/2022   10:21 AM 04/26/2022     3:45 PM 03/21/2022    4:18 PM  BMP  Glucose 70 - 99 mg/dL 97  99  88   BUN 8 - 27 mg/dL 17  18  16    Creatinine 0.76 - 1.27 mg/dL 7.82  9.56  2.13   BUN/Creat Ratio 10 - 24 15  14  14    Sodium 134 - 144 mmol/L 145  144  142   Potassium 3.5 - 5.2 mmol/L 4.3  4.3  3.9   Chloride 96 - 106 mmol/L 106  102  105   CO2 20 - 29 mmol/L 27  28  27    Calcium 8.6 - 10.2 mg/dL 9.0  9.1  8.8     Current antihypertensive regimen:  Doxazosin 8 mg nightly Furosemide 40 mg daily Ramipril 5 mg daily   Patient verbally confirms he is taking the above medications as directed. Yes  How often are you checking your Blood Pressure? daily  he checks his blood pressure in the morning before taking his medication.  Current home BP readings:   Patient wasn't home and couldn't remember his reading but advised that his numbers are always normal at home  Wrist or arm cuff: Arm  Any readings above 180/100? No  What recent interventions/DTPs have been made by any provider to improve Blood Pressure control since last CPP Visit: None ID  Any recent hospitalizations or ED visits since last visit with CPP? No  What diet changes have been made to improve Blood Pressure Control?  None ID  What exercise is being done to improve your Blood Pressure Control?   None ID  Adherence Review: Is the patient currently on ACE/ARB medication? Yes Does the patient have >5 day gap between last estimated fill dates? Yes  Star Rating Drugs:  Ramipril 5 mg last filled on 08/07/2022 for a 90-Day supply with [facility] Rosuvastatin 10 mg last filled on 04/09/2022 for a 90-Day supply with [facility]  Spoke to patient and he reports today he is doing well. Patient denies any issues or concerns at this time. Patient denies any ill symptoms. Patient has a follow-up with CPP in June and was reminded of that appointment.  Adelene Idler, CPA/CMA Clinical Pharmacist Assistant Phone: [phone]

## 2022-08-29 ENCOUNTER — Ambulatory Visit: Payer: PPO

## 2022-08-29 MED ORDER — ROSUVASTATIN CALCIUM 10 MG PO TABS: ORAL_TABLET | ORAL | 3 refills | Status: DC

## 2022-08-29 NOTE — Addendum Note (Signed)
Addended by: Daron Offer A on: 08/29/2022 03:34 PM   Modules accepted: Orders

## 2022-08-31 ENCOUNTER — Other Ambulatory Visit: Payer: Self-pay

## 2022-08-31 ENCOUNTER — Emergency Department
Admission: EM | Admit: 2022-08-31 | Discharge: 2022-08-31 | Disposition: A | Discharge status: Home / Self Care | Payer: PPO | Attending: Emergency Medicine | Admitting: Emergency Medicine

## 2022-08-31 DIAGNOSIS — S51811A Laceration without foreign body of right forearm, initial encounter: Secondary | ICD-10-CM | POA: Insufficient documentation

## 2022-08-31 DIAGNOSIS — W231XXA Caught, crushed, jammed, or pinched between stationary objects, initial encounter: Secondary | ICD-10-CM | POA: Diagnosis not present

## 2022-08-31 DIAGNOSIS — I10 Essential (primary) hypertension: Secondary | ICD-10-CM | POA: Insufficient documentation

## 2022-08-31 DIAGNOSIS — Z7901 Long term (current) use of anticoagulants: Secondary | ICD-10-CM | POA: Diagnosis not present

## 2022-08-31 DIAGNOSIS — Y92002 Bathroom of unspecified non-institutional (private) residence single-family (private) house as the place of occurrence of the external cause: Secondary | ICD-10-CM | POA: Diagnosis not present

## 2022-08-31 DIAGNOSIS — Z23 Encounter for immunization: Secondary | ICD-10-CM | POA: Insufficient documentation

## 2022-08-31 MED ORDER — TETANUS-DIPHTH-ACELL PERTUSSIS 5-2.5-18.5 LF-MCG/0.5 IM SUSY
0.5000 mL | PREFILLED_SYRINGE | Freq: Once | INTRAMUSCULAR | Status: AC
  Administered 2022-08-31: 0.5 mL via INTRAMUSCULAR
  Filled 2022-08-31: qty 0.5

## 2022-08-31 NOTE — Discharge Instructions (Signed)
You were evaluated in the emergency department for a laceration. It was repaired with steri strips and a bandage. Keep the area clean and dry.  Change the dressing in 48 hours or sooner if it gets wet or soiled.  Wash multiple times per day with soap and water.  Do not go into the ocean or swimming pool.  See your primary care provider this week for wound recheck.  Return to the emergency department for:  -- Fever > 100.64F -- Increase pain in the wound -- Increase redness and swelling -- Pus coming from the wound -- Wound bleeds more than a small amount or it does not stop -- Wound edges come apart -- Severe pain -- Weakness or numbness in the affected area  Or any other new or worsening symptoms. It was a pleasure caring for you.

## 2022-08-31 NOTE — ED Provider Notes (Signed)
[facility] Provider Note    Event Date/Time   First MD Initiated Contact with Patient 08/31/22 1104     (approximate)   History   Laceration   HPI  Jared Rice is a 89 y.o. male who presents today for evaluation of skin tear to his right arm.  Patient reports that he was getting up from the commode and accidentally struck his arm on the corner of the towel rack.  He denies fall to the ground.  There was no head strike.  He denies any pain in his arm, but reports that his skin is very thin and it has torn.  He reports that he takes blood thinners, Eliquis.  No weakness or paresthesias.  Patient Active Problem List   Diagnosis Date Noted   Localized swelling of left lower extremity 08/14/2022   Frequent UTI 06/12/2022   Memory changes 06/12/2022   Diarrhea 10/26/2021   Status post total right knee replacement 10/19/2020   Atrial fibrillation 05/12/2020   Urinary frequency 11/22/2019   Skin tear of forearm without complication, initial encounter- left 11/22/2019   Leg wound, right, sequela 10/29/2019   Chronic fatigue 11/29/2015   Benign prostatic hyperplasia 02/22/2015   Gastrointestinal ulcer    History of peptic ulcer    Hematochezia    Acute gastric ulcer    Melena 11/12/2014   Malignant neoplasm of skin 09/21/2014   Cataract 09/21/2014   Colon polyp 09/21/2014   CAFL (chronic airflow limitation) 09/21/2014   DD (diverticular disease) 09/21/2014   Benign essential tremor 09/21/2014   Personal history of tobacco use, presenting hazards to health 09/21/2014   Acid reflux 09/21/2014   Hemorrhoids 09/21/2014   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   HLD (hyperlipidemia) 09/21/2014   BP (high blood pressure) 09/21/2014   Hypothyroidism 09/21/2014   Lymphoma 09/21/2014   Malaise and fatigue 09/21/2014   Mononeuritis 09/21/2014   Muscle ache 09/21/2014   Neuropathy 09/21/2014   Numbness and tingling 09/21/2014   Arthritis, degenerative  09/21/2014   Awareness of heartbeats 09/21/2014   Prediabetes 09/21/2014   Rotator cuff syndrome 09/21/2014   Has a tremor 09/21/2014   Stasis, venous 09/21/2014   Avitaminosis D 09/21/2014   Arthritis of knee, degenerative 11/16/2013   Bradycardia, sinus 12/31/2011   Left bundle branch block 12/31/2011   First degree AV block 12/31/2011   Edema of both legs 12/31/2011   Bradycardia 12/31/2011   Localized edema 12/31/2011   COPD UNSPECIFIED 11/09/2009   HYPERTENSION, BENIGN 10/03/2009   SLEEP APNEA 10/03/2009   DYSPNEA 10/03/2009   CAD, NATIVE VESSEL 05/04/2009   Carotid disease, bilateral 05/04/2009   Disorder of artery or arteriole 05/04/2009          Physical Exam   Triage Vital Signs: ED Triage Vitals  Enc Vitals Group     BP 08/31/22 1027 (!) 146/95     Pulse Rate 08/31/22 1028 72     Resp 08/31/22 1027 16     Temp 08/31/22 1029 97.9 F (36.6 C)     Temp src --      SpO2 08/31/22 1028 100 %     Weight 08/31/22 1028 230 lb (104.3 kg)     Height 08/31/22 1028 6\' 1"  (1.854 m)     Head Circumference --      Peak Flow --      Pain Score 08/31/22 1028 3     Pain Loc --      Pain Edu? --  Excl. in GC? --     Most recent vital signs: Vitals:   08/31/22 1028 08/31/22 1029  BP:    Pulse: 72   Resp:    Temp:  97.9 F (36.6 C)  SpO2: 100%     Physical Exam Vitals and nursing note reviewed.  Constitutional:      General: Awake and alert. No acute distress.    Appearance: Normal appearance. The patient is normal weight.  HENT:     Head: Normocephalic and atraumatic.     Mouth: Mucous membranes are moist.  Eyes:     General: PERRL. Normal EOMs        Right eye: No discharge.        Left eye: No discharge.     Conjunctiva/sclera: Conjunctivae normal.  Cardiovascular:     Rate and Rhythm: Normal rate and regular rhythm.     Pulses: Normal pulses.  Pulmonary:     Effort: Pulmonary effort is normal. No respiratory distress.     Breath sounds: Normal  breath sounds.  Abdominal:     Abdomen is soft. There is no abdominal tenderness. No rebound or guarding. No distention. Musculoskeletal:        General: No swelling. Normal range of motion.     Cervical back: Normal range of motion and neck supple.  Skin:    General: Skin is warm and dry.     Capillary Refill: Capillary refill takes less than 2 seconds.     Findings: 8 cm V shaped skin tear to the dorsum of right forearm.  No active bleeding.  No swelling, ecchymosis, erythema, or lymphangitis noted.  Full and normal range of motion of wrist, elbow, and shoulder.  Normal supination and pronation.  Normal grip strength, normal intrinsic muscle function of the hand.  Normal radial pulse. Neurological:     Mental Status: The patient is awake and alert.      ED Results / Procedures / Treatments   Labs (all labs ordered are listed, but only abnormal results are displayed) Labs Reviewed - No data to display   EKG     RADIOLOGY     PROCEDURES:  Critical Care performed:   [name].[name].Laceration Repair  Date/Time: 08/31/2022 2:53 PM  Performed by: Jackelyn HoehnPoggi, Bilbo Carcamo E, PA-C Authorized by: Jackelyn HoehnPoggi, Marsheila Alejo E, PA-C   Consent:    Consent obtained:  Verbal   Consent given by:  Patient   Risks, benefits, and alternatives were discussed: yes     Risks discussed:  Infection, need for additional repair, nerve damage, pain, poor cosmetic result, poor wound healing, retained foreign body, tendon damage and vascular damage   Alternatives discussed:  No treatment Universal protocol:    Procedure explained and questions answered to patient or proxy's satisfaction: yes     Relevant documents present and verified: yes     Test results available: yes     Required blood products, implants, devices, and special equipment available: yes     Site/side marked: yes     Immediately prior to procedure, a time out was called: yes     Patient identity confirmed:  Verbally with patient Anesthesia:    Anesthesia  method:  None Laceration details:    Location:  Shoulder/arm   Shoulder/arm location:  R lower arm   Length (cm):  8   Depth (mm):  3 Pre-procedure details:    Preparation:  Patient was prepped and draped in usual sterile fashion Exploration:    Limited defect created (wound  extended): no     Hemostasis achieved with:  Direct pressure   Imaging outcome: foreign body not noted     Wound exploration: wound explored through full range of motion and entire depth of wound visualized     Wound extent: areolar tissue not violated, fascia not violated, no foreign body, no signs of injury, no nerve damage, no tendon damage, no underlying fracture and no vascular damage     Contaminated: no   Treatment:    Area cleansed with:  Povidone-iodine and saline   Amount of cleaning:  Standard   Irrigation solution:  Sterile saline   Irrigation volume:  500cc   Irrigation method:  Pressure wash   Debridement:  None   Undermining:  None   Scar revision: no   Skin repair:    Repair method:  Steri-Strips (skin flap pulled back over tear)   Number of Steri-Strips:  3 Approximation:    Approximation:  Loose Repair type:    Repair type:  Intermediate Post-procedure details:    Dressing:  Non-adherent dressing and sterile dressing   Procedure completion:  Tolerated well, no immediate complications    MEDICATIONS ORDERED IN ED: Medications  Tdap (BOOSTRIX) injection 0.5 mL (0.5 mLs Intramuscular Given 08/31/22 1121)     IMPRESSION / MDM / ASSESSMENT AND PLAN / ED COURSE  I reviewed the triage vital signs and the nursing notes.   Differential diagnosis includes, but is not limited to, skin tear, laceration, contusion, abrasion.  Patient is awake and alert, hemodynamically stable and neurovascularly intact.  He is at his baseline mental status, is answering questions appropriately,, and is in good spirits.  His wound was cleaned with normal saline and Betadine, and the skin flap was pulled over the  raw skin and secured with 2 Steri-Strips.  Nonstick dressing was Xeroform and antimicrobial gauze was applied, and then wrapped with tube gauze.  We discussed continued wound care at home.  His tetanus was updated.  We discussed the importance of close outpatient follow-up in 2 days for wound recheck, as well as strict return precautions.  I do not suspect bony injury as he has no bony tenderness or swelling.  He has full normal range of motion of his wrist, elbow, and shoulder without pain.  Normal radial pulse.  His intrinsic muscle function of his hand is intact and normal.  Sensation intact to light touch throughout arm.  Patient and wife understand and agree with plan.  He was discharged in stable condition.   Patient's presentation is most consistent with acute illness / injury with system symptoms.  FINAL CLINICAL IMPRESSION(S) / ED DIAGNOSES   Final diagnoses:  Forearm laceration, right, initial encounter     Rx / DC Orders   ED Discharge Orders     None        Note:  This document was prepared using Dragon voice recognition software and may include unintentional dictation errors.   Jackelyn Hoehn, PA-C 08/31/22 1504    Merwyn Katos, MD 08/31/22 1910

## 2022-08-31 NOTE — ED Triage Notes (Signed)
Pt to ED for laceration to right forearm, states hit it on towel rack this am. +blood thinners. Bleeding controll at this time.

## 2022-09-04 ENCOUNTER — Telehealth: Payer: Self-pay | Admitting: *Deleted

## 2022-09-04 NOTE — Telephone Encounter (Signed)
     Patient  visit on 08/31/2022  at [facility]  was for treatment   Have you been able to follow up with your primary care physician? Doing quite well out and about the injury is going to pcp to have bandage removed  The patient was able to obtain any needed medicine or equipment.  Are there diet recommendations that you are having difficulty following? na Patient expresses understanding of discharge instructions and education provided has no other needs at this time. yes   Jared Rice -Berneda Rose [facility] [place], Population [facility] [phone] [address] [place] , [place] [address] Email : [email or web address]

## 2022-09-05 ENCOUNTER — Ambulatory Visit: Payer: PPO | Attending: Family Medicine

## 2022-09-06 DIAGNOSIS — R4189 Other symptoms and signs involving cognitive functions and awareness: Secondary | ICD-10-CM | POA: Diagnosis not present

## 2022-09-06 DIAGNOSIS — R2689 Other abnormalities of gait and mobility: Secondary | ICD-10-CM | POA: Diagnosis not present

## 2022-09-06 DIAGNOSIS — G479 Sleep disorder, unspecified: Secondary | ICD-10-CM | POA: Diagnosis not present

## 2022-09-06 DIAGNOSIS — E538 Deficiency of other specified B group vitamins: Secondary | ICD-10-CM | POA: Diagnosis not present

## 2022-09-06 DIAGNOSIS — G250 Essential tremor: Secondary | ICD-10-CM | POA: Diagnosis not present

## 2022-09-11 ENCOUNTER — Ambulatory Visit: Payer: PPO | Admitting: Urology

## 2022-09-11 NOTE — Progress Notes (Unsigned)
09/12/2022 2:29 PM   Jared Rice January 05, 1933 [id number]  Referring provider: Ronnald Ramp, MD [address] [place],  [place] [address]  Urological history 1. BPH with LU TS -PSA (06/2022) 3.5  -Doxazosin 8 mg daily and finasteride 5 mg daily  2.  Right hydrocele -Right hydrocelectomy (2015)  Chief Complaint  Patient presents with   Benign Prostatic Hypertrophy    HPI: Jared Rice is a 89 y.o. male who presents today for follow up after a trial of Gemtesa with his wife, Jared Rice.   He did tear his arm on a towel rack and was seen in the ED.  He needs to have his bandages changed.    I PSS 6/2  PVR 0 mL   He states he is doing better with the Gemtesa.  He takes the medication as he remembers.  Patient denies any modifying or aggravating factors.  Patient denies any gross hematuria, dysuria or suprapubic/flank pain.  Patient denies any fevers, chills, nausea or vomiting.      IPSS     Row Name 09/12/22 1400         International Prostate Symptom Score   How often have you had the sensation of not emptying your bladder? Not at All     How often have you had to urinate less than every two hours? Less than 1 in 5 times     How often have you found you stopped and started again several times when you urinated? Less than 1 in 5 times     How often have you found it difficult to postpone urination? Less than half the time     How often have you had a weak urinary stream? Less than 1 in 5 times     How often have you had to strain to start urination? Not at All     How many times did you typically get up at night to urinate? 1 Time     Total IPSS Score 6       Quality of Life due to urinary symptoms   If you were to spend the rest of your life with your urinary condition just the way it is now how would you feel about that? Mostly Satisfied               Score:  1-7 Mild 8-19 Moderate 20-35 Severe    PMH: Past Medical  History:  Diagnosis Date   Actinic keratosis    Anginal pain    Aortic atherosclerosis    Arthritis    Atrial fibrillation    Basal cell carcinoma 02/06/2010   Left shoulder. Superficial.   Basal cell carcinoma 10/11/2013   Right medial forearm. Superficial   Basal cell carcinoma 04/10/2015   Right inf. lat. thigh. Superficial   Basal cell carcinoma 12/09/2016   Right cheek. Superficial.   Chronic airway obstruction, not elsewhere classified    Colon polyps    Complication of anesthesia    SPINAL DID NOT WORK FOR LAST KNEE REPLACEMENT AND HAD TO HAVE GA   Coronary atherosclerosis of native coronary artery    nonobstructive   Diabetes mellitus without complication    Essential hypertension, benign    GERD (gastroesophageal reflux disease)    H/O   Glucose intolerance (impaired glucose tolerance)    Heart murmur    History of kidney stones    Hyperlipidemia    Hypothyroidism    Iron deficiency anemia    Knee  pain, right    LBBB (left bundle branch block)    Occlusion and stenosis of carotid artery without mention of cerebral infarction    wears compression stockings   Other dyspnea and respiratory abnormality    w/ pseuodowheeze resolves with purse lip manuever   Precordial pain    Shoulder pain, right    Squamous cell carcinoma of skin 10/06/2007   Right forearm. SCCis   Squamous cell carcinoma of skin 10/11/2013   Left mid lat. pretibial. KA-like pattern.   Tremor    Unspecified sleep apnea    HAS NOT USED CPAP IN 20 YEARS   Venous insufficiency    Wears dentures    full upper   Wound cellulitis- right lower leg anterior  10/29/2019    Surgical History: Past Surgical History:  Procedure Laterality Date   CARDIAC CATHETERIZATION     X2   COLONOSCOPY  03/17/12   Dr Byrnett-diverticulosis   ESOPHAGOGASTRODUODENOSCOPY (EGD) WITH PROPOFOL N/A 11/25/2014   Procedure: ESOPHAGOGASTRODUODENOSCOPY (EGD) WITH PROPOFOL;  Surgeon: Midge Minium, MD;  Location: [facility];  Service: Endoscopy;  Laterality: N/A;  with biopsy   ESOPHAGOGASTRODUODENOSCOPY (EGD) WITH PROPOFOL N/A 01/05/2015   Procedure: ESOPHAGOGASTRODUODENOSCOPY (EGD) WITH PROPOFOL;  Surgeon: Midge Minium, MD;  Location: [facility];  Service: Endoscopy;  Laterality: N/A;   EYE SURGERY Bilateral    cataract    KNEE ARTHROPLASTY Right 09/06/2020   Procedure: COMPUTER ASSISTED TOTAL KNEE ARTHROPLASTY;  Surgeon: Donato Heinz, MD;  Location: [facility];  Service: Orthopedics;  Laterality: Right;   TOTAL KNEE ARTHROPLASTY Left 2012    Home Medications:  Allergies as of 09/12/2022       Reactions   Dexilant [dexlansoprazole] Rash   Primidone Itching, Rash        Medication List        Accurate as of September 12, 2022  2:29 PM. If you have any questions, ask your nurse or doctor.          diphenhydramine-acetaminophen 25-500 MG Tabs tablet Commonly known as: TYLENOL PM Take 2 tablets by mouth at bedtime.   doxazosin 8 MG tablet Commonly known as: CARDURA Take 1 tablet by mouth once daily   Eliquis 5 MG Tabs tablet Generic drug: apixaban Take 1 tablet by mouth twice a day   furosemide 40 MG tablet Commonly known as: LASIX Take 1 tablet by mouth once daily (dose change)   levothyroxine 88 MCG tablet Commonly known as: SYNTHROID TAKE 1 TABLET BY MOUTH ONCE DAILY IN THE MORNING BEFORE BREAKFAST   ramipril 5 MG capsule Commonly known as: ALTACE Take 1 capsule by mouth once daily   rosuvastatin 10 MG tablet Commonly known as: CRESTOR Take 1 tablet by mouth once daily   topiramate 50 MG tablet Commonly known as: TOPAMAX Take 1 tablet by mouth twice daily        Allergies:  Allergies  Allergen Reactions   Dexilant [Dexlansoprazole] Rash   Primidone Itching and Rash    Family History: Family History  Problem Relation Age of Onset   Heart failure Mother 7       congestive   Heart attack Father 8   Alcohol abuse Father    Alzheimer's disease  Brother 86   Alzheimer's disease Sister 36   Colon cancer Neg Hx    Liver disease Neg Hx     Social History:  reports that he quit smoking about 41 years ago. His smoking use included cigarettes. He has a  62.00 pack-year smoking history. He has never used smokeless tobacco. He reports that he does not drink alcohol and does not use drugs.   Physical Exam: BP (!) 99/59   Pulse 90   Ht  (1.854 m)   Wt 250 lb (113.4 kg)   BMI 32.98 kg/m   Constitutional:  Well nourished. Alert and oriented, No acute distress. HEENT: [place] AT, moist mucus membranes.  Trachea midline Cardiovascular: No clubbing, cyanosis, or edema. Respiratory: Normal respiratory effort, no increased work of breathing. Neurologic: Grossly intact, no focal deficits, moving all 4 extremities. Psychiatric: Normal mood and affect.   Laboratory data: N/A  Pertinent imaging:  09/12/22 14:12  Scan Result 0 ml   Assessment & Plan:    1. Benign prostatic hyperplasia with LUTS -Continue doxazosin and finasteride  2. Urgency -He is at goal taking the Gemtesa samples and since he does not take them daily, I have given him samples of Gemtesa so he can take them as he needs vs daily  3. Right hydrocele -Large right hydrocele.  He has no bothersome symptoms  4. Right arm wound -Unfortunately being in the urology office, we do not have the supplies to redress a large skin tear in the arm -He will go to his primary care doctor's office after leaving here to see if they can help him with supplies  Return in about 3 months (around 12/12/2022) for IPSS and PVR.   Cloretta Ned  [facility] [address], [address] [place], [place] [address] [phone]

## 2022-09-12 ENCOUNTER — Ambulatory Visit (INDEPENDENT_AMBULATORY_CARE_PROVIDER_SITE_OTHER): Payer: PPO | Admitting: Family Medicine

## 2022-09-12 ENCOUNTER — Ambulatory Visit: Payer: PPO | Admitting: Urology

## 2022-09-12 ENCOUNTER — Encounter: Payer: Self-pay | Admitting: Urology

## 2022-09-12 ENCOUNTER — Encounter: Payer: Self-pay | Admitting: Family Medicine

## 2022-09-12 VITALS — BP 99/59 | HR 90 | Ht 73.0 in | Wt 250.0 lb

## 2022-09-12 VITALS — BP 122/84 | HR 109 | Resp 16 | Wt 249.5 lb

## 2022-09-12 DIAGNOSIS — R350 Frequency of micturition: Secondary | ICD-10-CM

## 2022-09-12 DIAGNOSIS — S51811A Laceration without foreign body of right forearm, initial encounter: Secondary | ICD-10-CM | POA: Diagnosis not present

## 2022-09-12 DIAGNOSIS — R3915 Urgency of urination: Secondary | ICD-10-CM | POA: Diagnosis not present

## 2022-09-12 DIAGNOSIS — N401 Enlarged prostate with lower urinary tract symptoms: Secondary | ICD-10-CM | POA: Diagnosis not present

## 2022-09-12 DIAGNOSIS — S51812D Laceration without foreign body of left forearm, subsequent encounter: Secondary | ICD-10-CM

## 2022-09-12 LAB — BLADDER SCAN AMB NON-IMAGING: Scan Result: 0

## 2022-09-12 NOTE — Progress Notes (Signed)
I,Joseline E Rosas,acting as a scribe for [employer], MD.,have documented all relevant documentation on the behalf of Ronnald Ramp, MD,as directed by  Ronnald Ramp, MD while in the presence of Ronnald Ramp, MD.   Established patient visit   Patient: Jared Rice   DOB: 1932-09-20   89 y.o. Male  MRN: [id number] Visit Date: 09/12/2022  Today's healthcare provider: Ronnald Ramp, MD   No chief complaint on file.  Subjective    HPI  Follow up ER visit  Patient was seen in ER for forearm laceration, right on 08/31/2022. Treatment for this included wound was cleaned with normal saline and Betadine, and the skin flap was pulled over the raw skin and secured with 2 Steri-Strips. Nonstick dressing was Xeroform and antimicrobial gauze was applied, and then wrapped with tube gauze. He reports excellent compliance with treatment. He reports this condition is Improved but noticed today that arm was oozing some blood earlier today  He denies pain, reports he has been adherent to his eliquis  Denies re-injuring the arm recently   -----------------------------------------------------------------------------------------    Medications: Outpatient Medications Prior to Visit  Medication Sig   apixaban (ELIQUIS) 5 MG TABS tablet Take 1 tablet by mouth twice a day   diphenhydramine-acetaminophen (TYLENOL PM) 25-500 MG TABS Take 2 tablets by mouth at bedtime.   doxazosin (CARDURA) 8 MG tablet Take 1 tablet by mouth once daily   furosemide (LASIX) 40 MG tablet Take 1 tablet by mouth once daily (dose change)   levothyroxine (SYNTHROID) 88 MCG tablet TAKE 1 TABLET BY MOUTH ONCE DAILY IN THE MORNING BEFORE BREAKFAST   ramipril (ALTACE) 5 MG capsule Take 1 capsule by mouth once daily   rosuvastatin (CRESTOR) 10 MG tablet Take 1 tablet by mouth once daily   topiramate (TOPAMAX) 50 MG tablet Take 1 tablet by mouth twice daily   No  facility-administered medications prior to visit.    Review of Systems     Objective    BP 122/84 (BP Location: Left Arm, Patient Position: Sitting, Cuff Size: Large)   Pulse (!) 109   Resp 16   Wt 249 lb 8 oz (113.2 kg)   BMI 32.92 kg/m    Physical Exam Musculoskeletal:     Right forearm: Laceration and tenderness present.     Left forearm: Normal.       Arms:  Skin:    Findings: Bruising, erythema, laceration and wound present. No ecchymosis.     Comments: Skin tear laceration on right posterior forearm, fresh blood collected under original wound dressing Palpable radial pulse  No objective signs of infection at this time, area does not feel warm to touch  Granulation tissue is visualized        Results for orders placed or performed in visit on 09/12/22  Bladder Scan (Post Void Residual) in office  Result Value Ref Range   Scan Result 0 ml     Assessment & Plan     Problem List Items Addressed This Visit       Musculoskeletal and Integument   Skin tear of forearm without complication, left, subsequent encounter - Primary    Patient is stable  Wound is healing  Wound was irrigated with NS and nonadherent,light pressure dressing was used to wrap the left forearm  Patient was neurovascularly intact  Recommended follow up in 5 days for wound check and dressing change          Return in about 5 days (  around 09/17/2022) for wound check .        The entirety of the information documented in the History of Present Illness, Review of Systems and Physical Exam were personally obtained by me. Portions of this information were initially documented by Hetty Ely, CMA . I, Ronnald Ramp, MD have reviewed the documentation above for thoroughness and accuracy.      Ronnald Ramp, MD  [facility] [phone] (phone) [phone] (fax)  [facility]

## 2022-09-13 NOTE — Assessment & Plan Note (Signed)
Patient is stable  Wound is healing  Wound was irrigated with NS and nonadherent,light pressure dressing was used to wrap the left forearm  Patient was neurovascularly intact  Recommended follow up in 5 days for wound check and dressing change

## 2022-09-16 NOTE — Progress Notes (Deleted)
      Established patient visit   Patient: Jared Rice   DOB: November 22, 1932   89 y.o. Male  MRN: [id number] Visit Date: 09/17/2022  Today's healthcare provider: Ronnald Ramp, MD   No chief complaint on file.  Subjective    HPI  Follow up for wound check  The patient was last seen for this 5 days ago. Changes made at last visit include wound cleaned.  He reports {excellent/good/fair/poor:19665} compliance with treatment. He feels that condition is {improved/worse/unchanged:3041574}. He {is/is not:21021397} having side effects. ***  -----------------------------------------------------------------------------------------   Medications: Outpatient Medications Prior to Visit  Medication Sig   apixaban (ELIQUIS) 5 MG TABS tablet Take 1 tablet by mouth twice a day   diphenhydramine-acetaminophen (TYLENOL PM) 25-500 MG TABS Take 2 tablets by mouth at bedtime.   doxazosin (CARDURA) 8 MG tablet Take 1 tablet by mouth once daily   furosemide (LASIX) 40 MG tablet Take 1 tablet by mouth once daily (dose change)   levothyroxine (SYNTHROID) 88 MCG tablet TAKE 1 TABLET BY MOUTH ONCE DAILY IN THE MORNING BEFORE BREAKFAST   ramipril (ALTACE) 5 MG capsule Take 1 capsule by mouth once daily   rosuvastatin (CRESTOR) 10 MG tablet Take 1 tablet by mouth once daily   topiramate (TOPAMAX) 50 MG tablet Take 1 tablet by mouth twice daily   No facility-administered medications prior to visit.    Review of Systems  {Labs  Heme  Chem  Endocrine  Serology  Results Review (optional):23779}   Objective    There were no vitals taken for this visit. {Show previous vital signs (optional):23777}  Physical Exam  ***  No results found for any visits on 09/17/22.  Assessment & Plan     ***  No follow-ups on file.      {provider attestation***:1}   Ronnald Ramp, MD  [facility] [phone] (phone) [phone] (fax)  [facility]

## 2022-09-17 ENCOUNTER — Ambulatory Visit: Payer: PPO | Admitting: Family Medicine

## 2022-09-18 ENCOUNTER — Ambulatory Visit: Payer: PPO | Admitting: Physician Assistant

## 2022-09-24 ENCOUNTER — Other Ambulatory Visit: Payer: Self-pay | Admitting: Physician Assistant

## 2022-09-25 ENCOUNTER — Encounter: Payer: Self-pay | Admitting: Family Medicine

## 2022-09-25 ENCOUNTER — Ambulatory Visit (INDEPENDENT_AMBULATORY_CARE_PROVIDER_SITE_OTHER): Payer: PPO | Admitting: Family Medicine

## 2022-09-25 VITALS — BP 140/66 | HR 79 | Temp 98.1°F | Ht 74.0 in | Wt 252.9 lb

## 2022-09-25 DIAGNOSIS — I878 Other specified disorders of veins: Secondary | ICD-10-CM | POA: Diagnosis not present

## 2022-09-25 DIAGNOSIS — L03113 Cellulitis of right upper limb: Secondary | ICD-10-CM

## 2022-09-25 DIAGNOSIS — S51812D Laceration without foreign body of left forearm, subsequent encounter: Secondary | ICD-10-CM

## 2022-09-25 MED ORDER — SULFAMETHOXAZOLE-TRIMETHOPRIM 800-160 MG PO TABS: 1.0000 | ORAL_TABLET | Freq: Two times a day (BID) | ORAL | 0 refills | Status: DC

## 2022-09-25 NOTE — Telephone Encounter (Signed)
Requested Prescriptions  Pending Prescriptions Disp Refills   doxazosin (CARDURA) 8 MG tablet [Pharmacy Med Name: Doxazosin Mesylate 8 MG Oral Tablet] 90 tablet 0    Sig: Take 1 tablet by mouth once daily     Cardiovascular:  Alpha Blockers Passed - 09/24/2022  6:40 AM      Passed - Last BP in normal range    BP Readings from Last 1 Encounters:  09/12/22 122/84         Passed - Valid encounter within last 6 months    Recent Outpatient Visits           1 week ago Skin tear of forearm without complication, left, subsequent encounter   [facility] Simmons-Robinson, [place], MD   1 month ago HYPERTENSION, BENIGN   [name] Simmons-Robinson, [place], MD   2 months ago Cellulitis of right lower extremity   [facility] Alfredia Ferguson, PA-C   2 months ago Cellulitis of right lower extremity   [facility] Alfredia Ferguson, PA-C   2 months ago Frequent UTI   [facility] Rum[place], Darl Householder, DO       Future Appointments             Today [name], FNP [facility]   In 3 weeks Deirdre Evener, MD [facility]   In 2 months McGowan, Elana Alm [facility] [place]   In 3 months Simmons-Robinson, Tawanna Cooler, MD [facility]

## 2022-09-25 NOTE — Assessment & Plan Note (Signed)
4/6 and 4/18 visits Has not changed dressing in 13 days Homero Fellers odor to site with discharge present Recommend cleaning and re dressing and abx to assist with close follow up Home care recs printed; supplies provided

## 2022-09-25 NOTE — Assessment & Plan Note (Signed)
Chronic, stable Continue low salt diet, water intake and exercise/elevation to assist

## 2022-09-25 NOTE — Progress Notes (Signed)
I,J'ya E Hunter,acting as a scribe for [name], FNP.,have documented all relevant documentation on the behalf of [name], FNP,as directed by  [name], FNP while in the presence of [name], FNP.   Established patient visit   Patient: Jared Rice   DOB: 05-Apr-1933   89 y.o. Male  MRN: [id number] Visit Date: 09/25/2022  Today's healthcare provider: [name], FNP  Introduced to nurse practitioner role and practice setting.  All questions answered.  Discussed provider/patient relationship and expectations.  Chief Complaint  Patient presents with   Wound Check   Subjective    HPI  Patient also presents with concern of edema of both legs. Denies numbness.   Care Gaps  Patient is aware that he due for his diabetic eye exam.  Medications: Outpatient Medications Prior to Visit  Medication Sig   apixaban (ELIQUIS) 5 MG TABS tablet Take 1 tablet by mouth twice a day   diphenhydramine-acetaminophen (TYLENOL PM) 25-500 MG TABS Take 2 tablets by mouth at bedtime.   doxazosin (CARDURA) 8 MG tablet Take 1 tablet by mouth once daily   furosemide (LASIX) 40 MG tablet Take 1 tablet by mouth once daily (dose change)   levothyroxine (SYNTHROID) 88 MCG tablet TAKE 1 TABLET BY MOUTH ONCE DAILY IN THE MORNING BEFORE BREAKFAST   ramipril (ALTACE) 5 MG capsule Take 1 capsule by mouth once daily   rosuvastatin (CRESTOR) 10 MG tablet Take 1 tablet by mouth once daily   topiramate (TOPAMAX) 50 MG tablet Take 1 tablet by mouth twice daily   No facility-administered medications prior to visit.    Review of Systems     Objective    BP (!) 140/66 (BP Location: Right Arm, Patient Position: Sitting, Cuff Size: Large)   Pulse 79   Temp 98.1 F (36.7 C) (Oral)   Ht 6\' 2"  (1.88 m)   Wt 252 lb 14.4 oz (114.7 kg)   SpO2 93%   BMI 32.47 kg/m    Physical Exam Vitals and nursing note reviewed.  Constitutional:      Appearance: Normal appearance. He is obese.   HENT:     Head: Normocephalic and atraumatic.  Eyes:     Pupils: Pupils are equal, round, and reactive to light.  Cardiovascular:     Rate and Rhythm: Normal rate and regular rhythm.     Pulses: Normal pulses.     Heart sounds: Normal heart sounds.  Pulmonary:     Effort: Pulmonary effort is normal.     Breath sounds: Normal breath sounds.  Musculoskeletal:        General: Swelling present. Normal range of motion.     Cervical back: Normal range of motion.     Right lower leg: Edema present.     Left lower leg: Edema present.     Comments: Chronic venous insufficiency noted; skin discoloration and hairless, shiny skin. Edema present. At baseline.   Skin:    General: Skin is warm and dry.     Capillary Refill: Capillary refill takes less than 2 seconds.     Findings: Lesion present.       Neurological:     General: No focal deficit present.     Mental Status: He is alert and oriented to person, place, and time. Mental status is at baseline.  Psychiatric:        Mood and Affect: Mood normal.        Behavior: Behavior  normal.        Thought Content: Thought content normal.        Judgment: Judgment normal.     No results found for any visits on 09/25/22.  Assessment & Plan     Problem List Items Addressed This Visit       Musculoskeletal and Integument   Skin tear of forearm without complication, left, subsequent encounter - Primary    4/6 and 4/18 visits Has not changed dressing in 13 days Homero Fellers odor to site with discharge present Recommend cleaning and re dressing and abx to assist with close follow up Home care recs printed; supplies provided        Other   Cellulitis of right upper extremity    Concern for infection at site of skin tear given discharge and order and redness Recommend ABX to assist       Stasis, venous    Chronic, stable Continue low salt diet, water intake and exercise/elevation to assist      Return in about 1 week (around 10/02/2022)  for wound check.     Leilani Merl, FNP, have reviewed all documentation for this visit. The documentation on 09/25/22 for the exam, diagnosis, procedures, and orders are all accurate and complete.  [name], FNP  [facility] [phone] (phone) [phone] (fax)  [facility]

## 2022-09-25 NOTE — Assessment & Plan Note (Signed)
Concern for infection at site of skin tear given discharge and order and redness Recommend ABX to assist

## 2022-09-25 NOTE — Patient Instructions (Addendum)
5/4 Remove dressing Clean with gentle soap and water Clean with iodine Dry, pat with clean towel Add telfa/non stick pad Add guaze Gentle wrap  5/6 Remove dressing Clean with gentle soap and water Clean with iodine Dry, pat with clean towel Add dressing if needed

## 2022-09-30 ENCOUNTER — Encounter: Payer: Self-pay | Admitting: Family Medicine

## 2022-10-02 ENCOUNTER — Encounter: Payer: Self-pay | Admitting: Family Medicine

## 2022-10-02 ENCOUNTER — Ambulatory Visit (INDEPENDENT_AMBULATORY_CARE_PROVIDER_SITE_OTHER): Payer: PPO | Admitting: Family Medicine

## 2022-10-02 VITALS — BP 161/96 | HR 82 | Temp 97.9°F | Resp 20 | Wt 252.0 lb

## 2022-10-02 DIAGNOSIS — S51812D Laceration without foreign body of left forearm, subsequent encounter: Secondary | ICD-10-CM | POA: Diagnosis not present

## 2022-10-02 DIAGNOSIS — R152 Fecal urgency: Secondary | ICD-10-CM | POA: Diagnosis not present

## 2022-10-02 DIAGNOSIS — I10 Essential (primary) hypertension: Secondary | ICD-10-CM

## 2022-10-02 DIAGNOSIS — R195 Other fecal abnormalities: Secondary | ICD-10-CM | POA: Diagnosis not present

## 2022-10-02 DIAGNOSIS — R159 Full incontinence of feces: Secondary | ICD-10-CM | POA: Diagnosis not present

## 2022-10-02 DIAGNOSIS — K529 Noninfective gastroenteritis and colitis, unspecified: Secondary | ICD-10-CM | POA: Diagnosis not present

## 2022-10-02 DIAGNOSIS — R413 Other amnesia: Secondary | ICD-10-CM

## 2022-10-02 NOTE — Assessment & Plan Note (Addendum)
Chronic, uncontrolled today and worse than last week Goal 139/79 or less given chronic ASCVD risk with age and hx of Afib; previously maintained on Eliqus 5 mg BID, Cardura 8 mg, Lasix 40 mg, Ramipril 5 mg  Reports not taking medication due to "old age" Encouraged to take all medications as previously prescribed

## 2022-10-02 NOTE — Assessment & Plan Note (Signed)
10/02/2022 Site was changed at least x1 since last o/v; less odor, less discharge.  Redressed s/p iodine cleaning with use of telfa, gauze and home ACE.  Plan to call on 5/10 to discuss dressing removal/site eval.  4/6 and 4/18 visits Has not changed dressing in 13 days Homero Fellers odor to site with discharge present Recommend cleaning and re dressing and abx to assist with close follow up Home care recs printed; supplies provided

## 2022-10-02 NOTE — Assessment & Plan Note (Signed)
Chronic, ongoing x5 months

## 2022-10-02 NOTE — Assessment & Plan Note (Signed)
Referral to GI to assist; ongoing x 5 months with family concern

## 2022-10-02 NOTE — Progress Notes (Signed)
I,Sulibeya S Dimas,acting as a Neurosurgeon for [name], FNP.,have documented all relevant documentation on the behalf of [name], FNP,as directed by  [name], FNP while in the presence of [name], FNP.   Established patient visit  Patient: Jared Rice   DOB: 1933/05/03   89 y.o. Male  MRN: [id number] Visit Date: 10/02/2022  Today's healthcare provider: [name], FNP  Re Introduced to nurse practitioner role and practice setting.  All questions answered.  Discussed provider/patient relationship and expectations.  Chief Complaint  Patient presents with   Follow-up   Subjective    HPI  Follow up for skin tear of forearm without complication  The patient was last seen for this 1 weeks ago. Changes made at last visit include start Bactrim DS 800-160 mg BID.  He reports  not starting medication He feels that condition is Unchanged. Has reports not taken off dressing since last office visit.  [facility] pharmacy was called and the pharmacist reports that patient did pick up medication the same day; however, patient without memory of picking up or using the medication. Site looks improved; defer additional medication at this time. ----------------------------------------------------------------------------------------- Patient's daughter sent a message through mychart reporting that patient has diarrhea. He reports diarrhea x a few weeks. He reports diarrhea is not that bad. Patient advised that referral would be placed as ongoing x5 months.  Phone call to daughter to inform her of ongoing concerns for mental stability; 10 minute conversation provided. Reached at [phone]. Encouraged to have difficult conversation regarding his ability to care for himself and concern for memory loss given wound care instructions and medication compliance.  Medications: Outpatient Medications Prior to Visit  Medication Sig   apixaban (ELIQUIS) 5 MG TABS tablet Take 1 tablet by  mouth twice a day   diphenhydramine-acetaminophen (TYLENOL PM) 25-500 MG TABS Take 2 tablets by mouth at bedtime.   doxazosin (CARDURA) 8 MG tablet Take 1 tablet by mouth once daily   furosemide (LASIX) 40 MG tablet Take 1 tablet by mouth once daily (dose change)   levothyroxine (SYNTHROID) 88 MCG tablet TAKE 1 TABLET BY MOUTH ONCE DAILY IN THE MORNING BEFORE BREAKFAST   ramipril (ALTACE) 5 MG capsule Take 1 capsule by mouth once daily   rosuvastatin (CRESTOR) 10 MG tablet Take 1 tablet by mouth once daily   sulfamethoxazole-trimethoprim (BACTRIM DS) 800-160 MG tablet Take 1 tablet by mouth 2 (two) times daily.   topiramate (TOPAMAX) 50 MG tablet Take 1 tablet by mouth twice daily   No facility-administered medications prior to visit.    Review of Systems  Constitutional:  Negative for chills and fever.  Respiratory:  Negative for cough and shortness of breath.   Gastrointestinal:  Positive for diarrhea. Negative for abdominal pain, nausea and vomiting.     Objective    BP (!) 161/96 (BP Location: Left Arm, Patient Position: Sitting, Cuff Size: Large)   Pulse 82   Temp 97.9 F (36.6 C) (Temporal)   Resp 20   Wt 252 lb (114.3 kg)   SpO2 92%   BMI 32.35 kg/m   BP Readings from Last 3 Encounters:  10/02/22 (!) 161/96  09/25/22 (!) 140/66  09/12/22 122/84   Wt Readings from Last 3 Encounters:  10/02/22 252 lb (114.3 kg)  09/25/22 252 lb 14.4 oz (114.7 kg)  09/12/22 249 lb 8 oz (113.2 kg)   Physical Exam Constitutional:      General: He is not in  acute distress.    Appearance: Normal appearance. He is obese. He is not ill-appearing, toxic-appearing or diaphoretic.  Abdominal:     Comments: Chronic diarrhea   Musculoskeletal:        General: Swelling and tenderness present.  Skin:    Findings: Lesion present.     Comments: Improving skin tear  Neurological:     Mental Status: He is alert.     Motor: Weakness present.  Psychiatric:        Attention and Perception: He  is inattentive.        Mood and Affect: Mood and affect normal.        Speech: Speech normal.        Behavior: Behavior normal. Behavior is cooperative.        Thought Content: Thought content normal.        Cognition and Memory: Cognition is impaired. Memory is impaired. He exhibits impaired recent memory and impaired remote memory.        Judgment: Judgment normal.    No results found for any visits on 10/02/22.  Assessment & Plan     Problem List Items Addressed This Visit       Cardiovascular and Mediastinum   BP (high blood pressure)    Chronic, uncontrolled today and worse than last week Goal 139/79 or less given chronic ASCVD risk with age and hx of Afib; previously maintained on Eliqus 5 mg BID, Cardura 8 mg, Lasix 40 mg, Ramipril 5 mg  Reports not taking medication due to "old age" Encouraged to take all medications as previously prescribed       Relevant Orders   AMB Referral to Community Care Coordinaton ([facility] Patients)     Digestive   Chronic diarrhea    Chronic, ongoing x5 months       Relevant Orders   Ambulatory referral to Gastroenterology   AMB Referral to Community Care Coordinaton ([facility] Patients)     Musculoskeletal and Integument   Skin tear of forearm without complication, left, subsequent encounter - Primary    10/02/2022 Site was changed at least x1 since last o/v; less odor, less discharge.  Redressed s/p iodine cleaning with use of telfa, gauze and home ACE.  Plan to call on 5/10 to discuss dressing removal/site eval.  4/6 and 4/18 visits Has not changed dressing in 13 days Homero Fellers odor to site with discharge present Recommend cleaning and re dressing and abx to assist with close follow up Home care recs printed; supplies provided      Relevant Orders   AMB Referral to [facility] Coordinaton ([facility] Patients)     Other   Incontinence of feces with fecal urgency    Referral to GI to assist; ongoing x 5 months with family concern        Relevant Orders   Ambulatory referral to Gastroenterology   AMB Referral to Community Care Coordinaton ([facility] Patients)   Loose bowel movements   Relevant Orders   Ambulatory referral to Gastroenterology   AMB Referral to Community Care Coordinaton ([facility] Patients)   Memory loss, short term    Patient presents alone to appt; noting that his wife is ill Reports forgetting his BP meds and is unclear if he took abx or changed dressing to wound as recommended Discussed with daughter who reported GI concern via mychart via phone call Previously referred to neuro to assist with tremor and concern for cognitive impairment CCM referral to case mgmt and SW placed to assist  Relevant Orders   AMB Referral to [facility] Coordinaton ([facility] Patients)   Return for chonic disease management.     Leilani Merl, FNP, have reviewed all documentation for this visit. The documentation on 10/02/22 for the exam, diagnosis, procedures, and orders are all accurate and complete.  [name], FNP  [facility] [phone] (phone) [phone] (fax)  [facility]

## 2022-10-02 NOTE — Assessment & Plan Note (Signed)
Patient presents alone to appt; noting that his wife is ill Reports forgetting his BP meds and is unclear if he took abx or changed dressing to wound as recommended Discussed with daughter who reported GI concern via mychart via phone call Previously referred to neuro to assist with tremor and concern for cognitive impairment CCM referral to case mgmt and SW placed to assist

## 2022-10-03 ENCOUNTER — Telehealth: Payer: Self-pay | Admitting: *Deleted

## 2022-10-03 ENCOUNTER — Other Ambulatory Visit: Payer: Self-pay | Admitting: Family Medicine

## 2022-10-03 DIAGNOSIS — E039 Hypothyroidism, unspecified: Secondary | ICD-10-CM

## 2022-10-03 NOTE — Progress Notes (Signed)
  Care Coordination   Note   10/03/2022 Name: CYNTHIA CHALLENDER MRN: [id number] DOB: 08/02/1932  Rudy Jew Leer is a 89 y.o. year old male who sees Simmons-Robinson, Tawanna Cooler, MD for primary care. I reached out to Fayrene Fearing A Cowdrey by phone today to offer care coordination services.  Mr. Cicchini was given information about Care Coordination services today including:   The Care Coordination services include support from the care team which includes your Nurse Coordinator, Clinical Social Worker, or Pharmacist.  The Care Coordination team is here to help remove barriers to the health concerns and goals most important to you. Care Coordination services are voluntary, and the patient may decline or stop services at any time by request to their care team member.   Care Coordination Consent Status: Patient agreed to services and verbal consent obtained.   Follow up plan:  Telephone appointment with care coordination team member scheduled for:  10/07/2022 and 10/08/2022  Encounter Outcome:  Pt. Scheduled from referral   Burman Nieves, [address] [facility] Care Coordination Care Guide Direct Dial: [phone]

## 2022-10-03 NOTE — Telephone Encounter (Signed)
Requested medication (s) are due for refill today:   Requesting a month too early  Requested medication (s) are on the active medication list:   Yes  Future visit scheduled:   Yes tomorrow 10/04/2022 with Robynn Pane   Last ordered: 08/05/2022 #60, 2 refills  Returned because he is for a repeat TSH after his dose was changed back in March 2024.   Order is in.   Returned also since he has an Nutritional therapist with Robynn Pane.     Requested Prescriptions  Pending Prescriptions Disp Refills   levothyroxine (SYNTHROID) 88 MCG tablet [Pharmacy Med Name: Levothyroxine Sodium 88 MCG Oral Tablet] 60 tablet 0    Sig: TAKE 1 TABLET BY MOUTH ONCE DAILY IN THE MORNING BEFORE BREAKFAST     Endocrinology:  Hypothyroid Agents Failed - 10/03/2022  9:30 AM      Failed - TSH in normal range and within 360 days    TSH  Date Value Ref Range Status  06/05/2022 4.640 (H) 0.450 - 4.500 uIU/mL Final         Passed - Valid encounter within last 12 months    Recent Outpatient Visits           Yesterday Skin tear of forearm without complication, left, subsequent encounter   [facility] Merita Norton T, FNP   1 week ago Skin tear of forearm without complication, left, subsequent encounter   [facility] Merita Norton T, FNP   3 weeks ago Skin tear of forearm without complication, left, subsequent encounter   [facility] Simmons-Robinson, Tawanna Cooler, MD   1 month ago HYPERTENSION, BENIGN   [facility] Simmons-Robinson, [place], MD   2 months ago Cellulitis of right lower extremity   [facility] [place] Alfredia Ferguson, [place]       Future Appointments             Tomorrow [name], FNP [facility]   In 1 week Deirdre Evener, MD [facility]   In 2 months McG[place]an, Elana [facility] [place]   In 2 months Simmons-Robinson,  Tawanna Cooler, MD [facility]

## 2022-10-04 ENCOUNTER — Telehealth (INDEPENDENT_AMBULATORY_CARE_PROVIDER_SITE_OTHER): Payer: PPO | Admitting: Family Medicine

## 2022-10-04 DIAGNOSIS — S51812D Laceration without foreign body of left forearm, subsequent encounter: Secondary | ICD-10-CM

## 2022-10-04 NOTE — Progress Notes (Signed)
I,[name],acting as a Neurosurgeon for Jared Kindle, FNP.,have documented all relevant documentation on the behalf of Jared Kindle, FNP,as directed by  Jared Kindle, FNP while in the presence of Jared Kindle, FNP.   MyChart Video Visit  Virtual Visit via Video Note   This format is felt to be most appropriate for this patient at this time. Physical exam was limited by quality of the video and audio technology used for the visit.   Patient location: kitchen Provider location: [facility] [address]  [address] [place], [place] [address]   I discussed the limitations of evaluation and management by telemedicine and the availability of in person appointments. The patient expressed understanding and agreed to proceed.  Patient: Jared Rice   DOB: 1932-07-03   89 y.o. Male  MRN: [id number] Visit Date: 10/04/2022  Today's healthcare provider: [name], FNP   Subjective    HPI  Patient is here today for video visit to check on his arm, states has had no issues with it and is doing good.  Medications: Outpatient Medications Prior to Visit  Medication Sig   apixaban (ELIQUIS) 5 MG TABS tablet Take 1 tablet by mouth twice a day   diphenhydramine-acetaminophen (TYLENOL PM) 25-500 MG TABS Take 2 tablets by mouth at bedtime.   doxazosin (CARDURA) 8 MG tablet Take 1 tablet by mouth once daily   furosemide (LASIX) 40 MG tablet Take 1 tablet by mouth once daily (dose change)   levothyroxine (SYNTHROID) 88 MCG tablet TAKE 1 TABLET BY MOUTH ONCE DAILY IN THE MORNING BEFORE BREAKFAST   ramipril (ALTACE) 5 MG capsule Take 1 capsule by mouth once daily   rosuvastatin (CRESTOR) 10 MG tablet Take 1 tablet by mouth once daily   sulfamethoxazole-trimethoprim (BACTRIM DS) 800-160 MG tablet Take 1 tablet by mouth 2 (two) times daily.   topiramate (TOPAMAX) 50 MG tablet Take 1 tablet by mouth twice daily   No facility-administered medications prior to visit.    Review  of Systems  All other systems reviewed and are negative.   Objective    There were no vitals taken for this visit.  Physical Exam Constitutional:      Appearance: Normal appearance. He is obese.  Pulmonary:     Effort: Pulmonary effort is normal.  Skin:    Comments: Patient removed dressing from L forearm; advised to keep wound open to air and limit application of vaseline given concern for moisture. Encouraged dry dressing before bed if needed.   Neurological:     Mental Status: He is alert. Mental status is at baseline.  Psychiatric:        Mood and Affect: Mood normal.        Thought Content: Thought content normal.        Assessment & Plan     Problem List Items Addressed This Visit       Musculoskeletal and Integument   Skin tear of forearm without complication, left, subsequent encounter - Primary    10/04/22 Virtual Patient removed dressing from L forearm; advised to keep wound open to air and limit application of vaseline given concern for moisture. Encouraged dry dressing before bed if needed.   10/02/2022 Site was changed at least x1 since last o/v; less odor, less discharge. Redressed s/p iodine cleaning with use of telfa, gauze and home ACE.  4/6 and 4/18 visits Has not changed dressing in 13 days Homero Fellers odor to site with discharge  present Recommend cleaning and re dressing and abx to assist with close follow up Home care recs printed; supplies provided      No follow-ups on file.    I discussed the assessment and treatment plan with the patient. The patient was provided an opportunity to ask questions and all were answered. The patient agreed with the plan and demonstrated an understanding of the instructions.   The patient was advised to call back or seek an in-person evaluation if the symptoms worsen or if the condition fails to improve as anticipated.  I provided 10 minutes of face-to-face time during this encounter discussing skin tear dressing  removal, cleaning, and next steps.  Leilani Merl, FNP, have reviewed all documentation for this visit. The documentation on 10/04/22 for the exam, diagnosis, procedures, and orders are all accurate and complete.  Jared Kindle, FNP [facility] [phone] (phone) [phone] (fax)  [facility]

## 2022-10-04 NOTE — Assessment & Plan Note (Signed)
10/04/22 Virtual Patient removed dressing from L forearm; advised to keep wound open to air and limit application of vaseline given concern for moisture. Encouraged dry dressing before bed if needed.   10/02/2022 Site was changed at least x1 since last o/v; less odor, less discharge. Redressed s/p iodine cleaning with use of telfa, gauze and home ACE.  4/6 and 4/18 visits Has not changed dressing in 13 days Homero Fellers odor to site with discharge present Recommend cleaning and re dressing and abx to assist with close follow up Home care recs printed; supplies provided

## 2022-10-07 ENCOUNTER — Ambulatory Visit: Payer: Self-pay | Admitting: *Deleted

## 2022-10-07 ENCOUNTER — Encounter: Payer: Self-pay | Admitting: *Deleted

## 2022-10-07 NOTE — Patient Outreach (Signed)
  Care Coordination   Initial Visit Note   10/07/2022 Name: LEYLAND NICKLIN MRN: [id number] DOB: 06/20/1932  Rudy Jew Bashore is a 89 y.o. year old male who sees Simmons-Robinson, Tawanna Cooler, MD for primary care. I spoke with  Rudy Jew Ploeger by phone today.  What matters to the patients health and wellness today?  Remain independent.  Uses cane when out of the home, lives with wife.     Goals Addressed             This Visit's Progress    Effective management of HTN       Care Coordination Interventions: Evaluation of current treatment plan related to hypertension self management and patient's adherence to plan as established by provider Reviewed medications with patient and discussed importance of compliance Discussed plans with patient for ongoing care management follow up and provided patient with direct contact information for care management team Advised patient, providing education and rationale, to monitor blood pressure daily and record, calling PCP for findings outside established parameters Discussed complications of poorly controlled blood pressure such as heart disease, stroke, circulatory complications, vision complications, kidney impairment, sexual dysfunction Screening for signs and symptoms of depression related to chronic disease state  Assessed social determinant of health barriers          SDOH assessments and interventions completed:  Yes  SDOH Interventions Today    Flowsheet Row Most Recent Value  SDOH Interventions   Food Insecurity Interventions Intervention Not Indicated  Housing Interventions Intervention Not Indicated  Transportation Interventions Intervention Not Indicated        Care Coordination Interventions:  Yes, provided   Interventions Today    Flowsheet Row Most Recent Value  Chronic Disease   Chronic disease during today's visit Hypertension (HTN)  General Interventions   General Interventions Discussed/Reviewed General  Interventions Reviewed, Doctor Visits, Durable Medical Equipment (DME)  Doctor Visits Discussed/Reviewed Doctor Visits Reviewed, PCP, Specialist, Annual Wellness Visits  [GI 5/21, dermatology 5/22, pharmacy 6/10, and AWV 06/11/2023]  Durable Medical Equipment (DME) Other  [Has blood pressure machine, not using.  Encouraged to monitor at least 2-3 times a week]  PCP/Specialist Visits Compliance with follow-up visit  Education Interventions   Education Provided Provided Education  Provided Verbal Education On Medication, When to see the doctor, Other  [blood pressure monitoring]         Follow up plan: Follow up call scheduled for 6/18    Encounter Outcome:  Pt. Visit Completed   Kemper Durie, RN, MSN, [facility] Care Management Care Management Coordinator [phone]

## 2022-10-08 ENCOUNTER — Ambulatory Visit: Payer: Self-pay | Admitting: *Deleted

## 2022-10-08 NOTE — Patient Outreach (Signed)
  Care Coordination   Initial Visit Note   10/08/2022 Name: KIJON LITTAU MRN: [id number] DOB: 11/19/1932  Rudy Jew Mazariego is a 89 y.o. year old male who sees Simmons-Robinson, Tawanna Cooler, MD for primary care. I spoke with  Rudy Jew Andress by phone today.  What matters to the patients health and wellness today?  Needs assessment completed, patient states having no care coordination needs at this time.    Goals Addressed             This Visit's Progress    Care coordination activities       Care Coordination Interventions: Interventions Today    Flowsheet Row Most Recent Value  Chronic Disease   Chronic disease during today's visit Hypertension (HTN)  General Interventions   General Interventions Discussed/Reviewed [employer] having a housekeeper 2x per month-confirmed having no additional community resource needs]  Doctor Visits Discussed/Reviewed Doctor Visits Discussed  PCP/Specialist Visits Compliance with follow-up visit  Mental Health Interventions   Mental Health Discussed/Reviewed Mental Health Discussed  [facility] health needs assessed, patient confirms having no mental health needs at this time]  Nutrition Interventions   Nutrition Discussed/Reviewed Nutrition Discussed  Safety Interventions   Safety Discussed/Reviewed Safety Discussed  [uses cain when ambulating, has railing up and down stairs]  Advanced Directive Interventions   Advanced Directives Discussed/Reviewed Advanced Directives Reviewed  [patient has documents]             SDOH assessments and interventions completed:  Yes  SDOH Interventions Today    Flowsheet Row Most Recent Value  SDOH Interventions   Food Insecurity Interventions Intervention Not Indicated  Housing Interventions Intervention Not Indicated  Transportation Interventions Intervention Not Indicated        Care Coordination Interventions:  Yes, provided   Follow up plan: No further intervention required.    Encounter Outcome:  Pt. Visit Completed

## 2022-10-08 NOTE — Patient Instructions (Signed)
Visit Information  Thank you for taking time to visit with me today. Please don't hesitate to contact me if I can be of assistance to you.   Following are the goals we discussed today:   Goals Addressed             This Visit's Progress    Care coordination activities       Care Coordination Interventions: Interventions Today    Flowsheet Row Most Recent Value  Chronic Disease   Chronic disease during today's visit Hypertension (HTN)  General Interventions   General Interventions Discussed/Reviewed [employer] having a housekeeper 2x per month-confirmed having no additional community resource needs]  Doctor Visits Discussed/Reviewed Doctor Visits Discussed  PCP/Specialist Visits Compliance with follow-up visit  Mental Health Interventions   Mental Health Discussed/Reviewed Mental Health Discussed  [facility] health needs assessed, patient confirms having no mental health needs at this time]  Nutrition Interventions   Nutrition Discussed/Reviewed Nutrition Discussed  Safety Interventions   Safety Discussed/Reviewed Safety Discussed  [uses cain when ambulating, has railing up and down stairs]  Advanced Directive Interventions   Advanced Directives Discussed/Reviewed Advanced Directives Reviewed  [patient has documents]            If you are experiencing a Mental Health or Behavioral Health Crisis or need someone to talk to, please call 911   Patient verbalizes understanding of instructions and care plan provided today and agrees to view in MyChart. Active MyChart status and patient understanding of how to access instructions and care plan via MyChart confirmed with patient.     No further follow up required: patient to contact this Child psychotherapist with any additional community resource needs  [employer], LCSW Clinical Social Worker  [facility] Care Management [phone]

## 2022-10-14 NOTE — Progress Notes (Unsigned)
Jared Amy, PA-C [address]  [address]  [place], [place] [address]  Main: [phone]  Fax: [phone]   Gastroenterology Consultation  Referring Provider:     Brett Albino* Primary Care Physician:  Ronnald Ramp, MD Primary Gastroenterologist:  Dr. Midge Minium / Jared Amy, PA-C  Reason for Consultation:     Diarrhea, Incontinence        HPI:   Jared Rice is a 89 y.o. y/o male referred for consultation & management  by Ronnald Ramp, MD.    Patient was referred for diarrhea and fecal incontinence.  He is here today with an assistant Jared Rice) who helps with his care 2 days a week.  Patient has dementia.  Patient states he was having diarrhea several weeks ago.  His diarrhea has resolved.  Currently he is having 2 bowel movements daily which are soft.  He denies rectal bleeding, black stools, abdominal pain, heartburn, or weight loss.  No new medications.  He has no concerns today.  Last colonoscopy done in 2013 by Dr. Lemar Livings showed diverticulosis, otherwise normal.  No repeat screening colonoscopy due to advanced age.  Has previous history of peptic ulcer in 2016 which healed, verified by EGDs done by Dr. Servando Snare.  Labs 09/06/2022 showed low vitamin B12 of 100.  Normal folate. Labs 05/2022 showed hemoglobin 12.1, MCV 95, platelets 164.  BUN 17, creatinine 1.17, GFR 60. No stool studies.   Past Medical History:  Diagnosis Date   Actinic keratosis    Anginal pain (HCC)    Aortic atherosclerosis (HCC)    Arthritis    Atrial fibrillation (HCC)    Basal cell carcinoma 02/06/2010   Left shoulder. Superficial.   Basal cell carcinoma 10/11/2013   Right medial forearm. Superficial   Basal cell carcinoma 04/10/2015   Right inf. lat. thigh. Superficial   Basal cell carcinoma 12/09/2016   Right cheek. Superficial.   Chronic airway obstruction, not elsewhere classified    Colon polyps    Complication of anesthesia    SPINAL DID  NOT WORK FOR LAST KNEE REPLACEMENT AND HAD TO HAVE GA   Coronary atherosclerosis of native coronary artery    nonobstructive   Diabetes mellitus without complication (HCC)    Essential hypertension, benign    GERD (gastroesophageal reflux disease)    H/O   Glucose intolerance (impaired glucose tolerance)    Heart murmur    History of kidney stones    Hyperlipidemia    Hypothyroidism    Iron deficiency anemia    Knee pain, right    LBBB (left bundle branch block)    Occlusion and stenosis of carotid artery without mention of cerebral infarction    wears compression stockings   Other dyspnea and respiratory abnormality    w/ pseuodowheeze resolves with purse lip manuever   Precordial pain    Shoulder pain, right    Squamous cell carcinoma of skin 10/06/2007   Right forearm. SCCis   Squamous cell carcinoma of skin 10/11/2013   Left mid lat. pretibial. KA-like pattern.   Tremor    Unspecified sleep apnea    HAS NOT USED CPAP IN 20 YEARS   Venous insufficiency    Wears dentures    full upper   Wound cellulitis- right lower leg anterior  10/29/2019    Past Surgical History:  Procedure Laterality Date   CARDIAC CATHETERIZATION     X2   COLONOSCOPY  03/17/12   Dr Byrnett-diverticulosis   ESOPHAGOGASTRODUODENOSCOPY (EGD) WITH PROPOFOL  N/A 11/25/2014   Procedure: ESOPHAGOGASTRODUODENOSCOPY (EGD) WITH PROPOFOL;  Surgeon: Midge Minium, MD;  Location: [facility];  Service: Endoscopy;  Laterality: N/A;  with biopsy   ESOPHAGOGASTRODUODENOSCOPY (EGD) WITH PROPOFOL N/A 01/05/2015   Procedure: ESOPHAGOGASTRODUODENOSCOPY (EGD) WITH PROPOFOL;  Surgeon: Midge Minium, MD;  Location: [facility];  Service: Endoscopy;  Laterality: N/A;   EYE SURGERY Bilateral    cataract    KNEE ARTHROPLASTY Right 09/06/2020   Procedure: COMPUTER ASSISTED TOTAL KNEE ARTHROPLASTY;  Surgeon: Donato Heinz, MD;  Location: [facility];  Service: Orthopedics;  Laterality: Right;   TOTAL KNEE  ARTHROPLASTY Left 2012    Prior to Admission medications   Medication Sig Start Date End Date Taking? Authorizing Provider  apixaban (ELIQUIS) 5 MG TABS tablet Take 1 tablet by mouth twice a day 04/05/22   [place], Peter M, MD  diphenhydramine-acetaminophen (TYLENOL PM) 25-500 MG TABS Take 2 tablets by mouth at bedtime.    [provider]  doxazosin (CARDURA) 8 MG tablet Take 1 tablet by mouth once daily 09/25/22   Simmons-Robinson, Makiera, MD  furosemide (LASIX) 40 MG tablet Take 1 tablet by mouth once daily (dose change) 04/09/22   Drubel, Lillia Abed, PA-C  levothyroxine (SYNTHROID) 88 MCG tablet TAKE 1 TABLET BY MOUTH ONCE DAILY IN THE MORNING BEFORE BREAKFAST 10/03/22   Simmons-Robinson, Tawanna Cooler, MD  ramipril (ALTACE) 5 MG capsule Take 1 capsule by mouth once daily 12/17/21   Bosie Clos, MD  rosuvastatin (CRESTOR) 10 MG tablet Take 1 tablet by mouth once daily 08/29/22   Simmons-Robinson, Makiera, MD  sulfamethoxazole-trimethoprim (BACTRIM DS) 800-160 MG tablet Take 1 tablet by mouth 2 (two) times daily. 09/25/22   [name], FNP  topiramate (TOPAMAX) 50 MG tablet Take 1 tablet by mouth twice daily 05/01/21   Malva Limes, MD    Family History  Problem Relation Age of Onset   Heart failure Mother 65       congestive   Heart attack Father 56   Alcohol abuse Father    Alzheimer's disease Brother 39   Alzheimer's disease Sister 71   Colon cancer Neg Hx    Liver disease Neg Hx      Social History   Tobacco Use   Smoking status: Former    Packs/day: 2.00    Years: 31.00    Additional pack years: 0.00    Total pack years: 62.00    Types: Cigarettes    Quit date: 12/24/1980    Years since quitting: 41.8   Smokeless tobacco: Never   Tobacco comments:    quit in 1982  Vaping Use   Vaping Use: Never used  Substance Use Topics   Alcohol use: No    Alcohol/week: 0.0 standard drinks of alcohol   Drug use: No    Allergies as of 10/15/2022 - Review Complete  10/07/2022  Allergen Reaction Noted   Dexilant [dexlansoprazole] Rash 11/22/2014   Primidone Itching and Rash 09/21/2014    Review of Systems:    All systems reviewed and negative except where noted in HPI.   Physical Exam:  There were no vitals taken for this visit. No LMP for male patient. Psych:  Alert and cooperative.  Febrile elderly male.  Normal mood and affect.  Some memory impairment.  Walks with a cane. General:   Alert,  Well-developed, well-nourished, pleasant and cooperative in NAD Head:  Normocephalic and atraumatic. Eyes:  Sclera clear, no icterus.   Conjunctiva pink. Neck:  Supple; no masses  or thyromegaly. Lungs:  Respirations even and unlabored.  Clear throughout to auscultation.   No wheezes, crackles, or rhonchi. No acute distress. Heart:  Regular rate and rhythm; no murmurs, clicks, rubs, or gallops. Abdomen:  Normal bowel sounds.  No bruits.  Soft, and non-distended without masses, hepatosplenomegaly or hernias noted.  No Tenderness.  No guarding or rebound tenderness.    Neurologic:  Alert and oriented x3;  grossly normal neurologically. Psych:  Alert and cooperative. Normal mood and affect.  Imaging Studies: No results found.  Assessment and Plan:   Jared Rice is a 89 y.o. y/o male has been referred for loose stools and fecal incontinence.  Patient states his symptoms have greatly improved in the past few weeks.  He is having 2 or 3 soft BMs daily with no more diarrhea.  Incidentally had a low vitamin B12 level checked by his PCP in April.  He denies any current GI symptoms today.  Loose Stools - Improved  Recommend he take Metamucil powder mix 1 packet and a drink once daily.  Follow-up if he has any recurrent diarrhea.  I will order Stool Studies (GI Pathogen Panal and C. Diff PCR) if he has recurrent diarrhea.  Vitamin B12 Deficiency  I advised him to take OTC vitamin B12 1000 mcg once daily.  Follow-up with PCP to monitor labs.  Follow up in as  needed if he has recurrent diarrhea or other GI symptoms.  Jared Amy, PA-C

## 2022-10-15 ENCOUNTER — Encounter: Payer: Self-pay | Admitting: Physician Assistant

## 2022-10-15 ENCOUNTER — Ambulatory Visit (INDEPENDENT_AMBULATORY_CARE_PROVIDER_SITE_OTHER): Payer: PPO | Admitting: Physician Assistant

## 2022-10-15 VITALS — BP 181/95 | HR 71 | Temp 97.6°F | Ht 73.0 in | Wt 245.4 lb

## 2022-10-15 DIAGNOSIS — R195 Other fecal abnormalities: Secondary | ICD-10-CM | POA: Diagnosis not present

## 2022-10-15 DIAGNOSIS — E538 Deficiency of other specified B group vitamins: Secondary | ICD-10-CM

## 2022-10-15 MED ORDER — METAMUCIL SMOOTH TEXTURE 58.6 % PO POWD
1.0000 | Freq: Every day | ORAL | 12 refills | Status: AC
Start: 2022-10-15 — End: 2023-04-13

## 2022-10-15 MED ORDER — VITAMIN B-12 1000 MCG PO TABS
1000.0000 ug | ORAL_TABLET | Freq: Every day | ORAL | Status: AC
Start: 2022-10-15 — End: ?

## 2022-10-15 NOTE — Patient Instructions (Signed)
Follow up as needed

## 2022-10-16 ENCOUNTER — Ambulatory Visit: Payer: PPO | Admitting: Dermatology

## 2022-10-29 IMAGING — CR DG CHEST 2V
1 series · 2 of 2 positions shown · non-contrast
Comparison: 02/24/2019, CT 11/06/2020

CLINICAL DATA: Shortness of breath

EXAM:
CHEST - 2 VIEW

[Series 1: dg chest 2 view · 0.14mm/px · 2 of 2 slices shown]
[im 1/2]
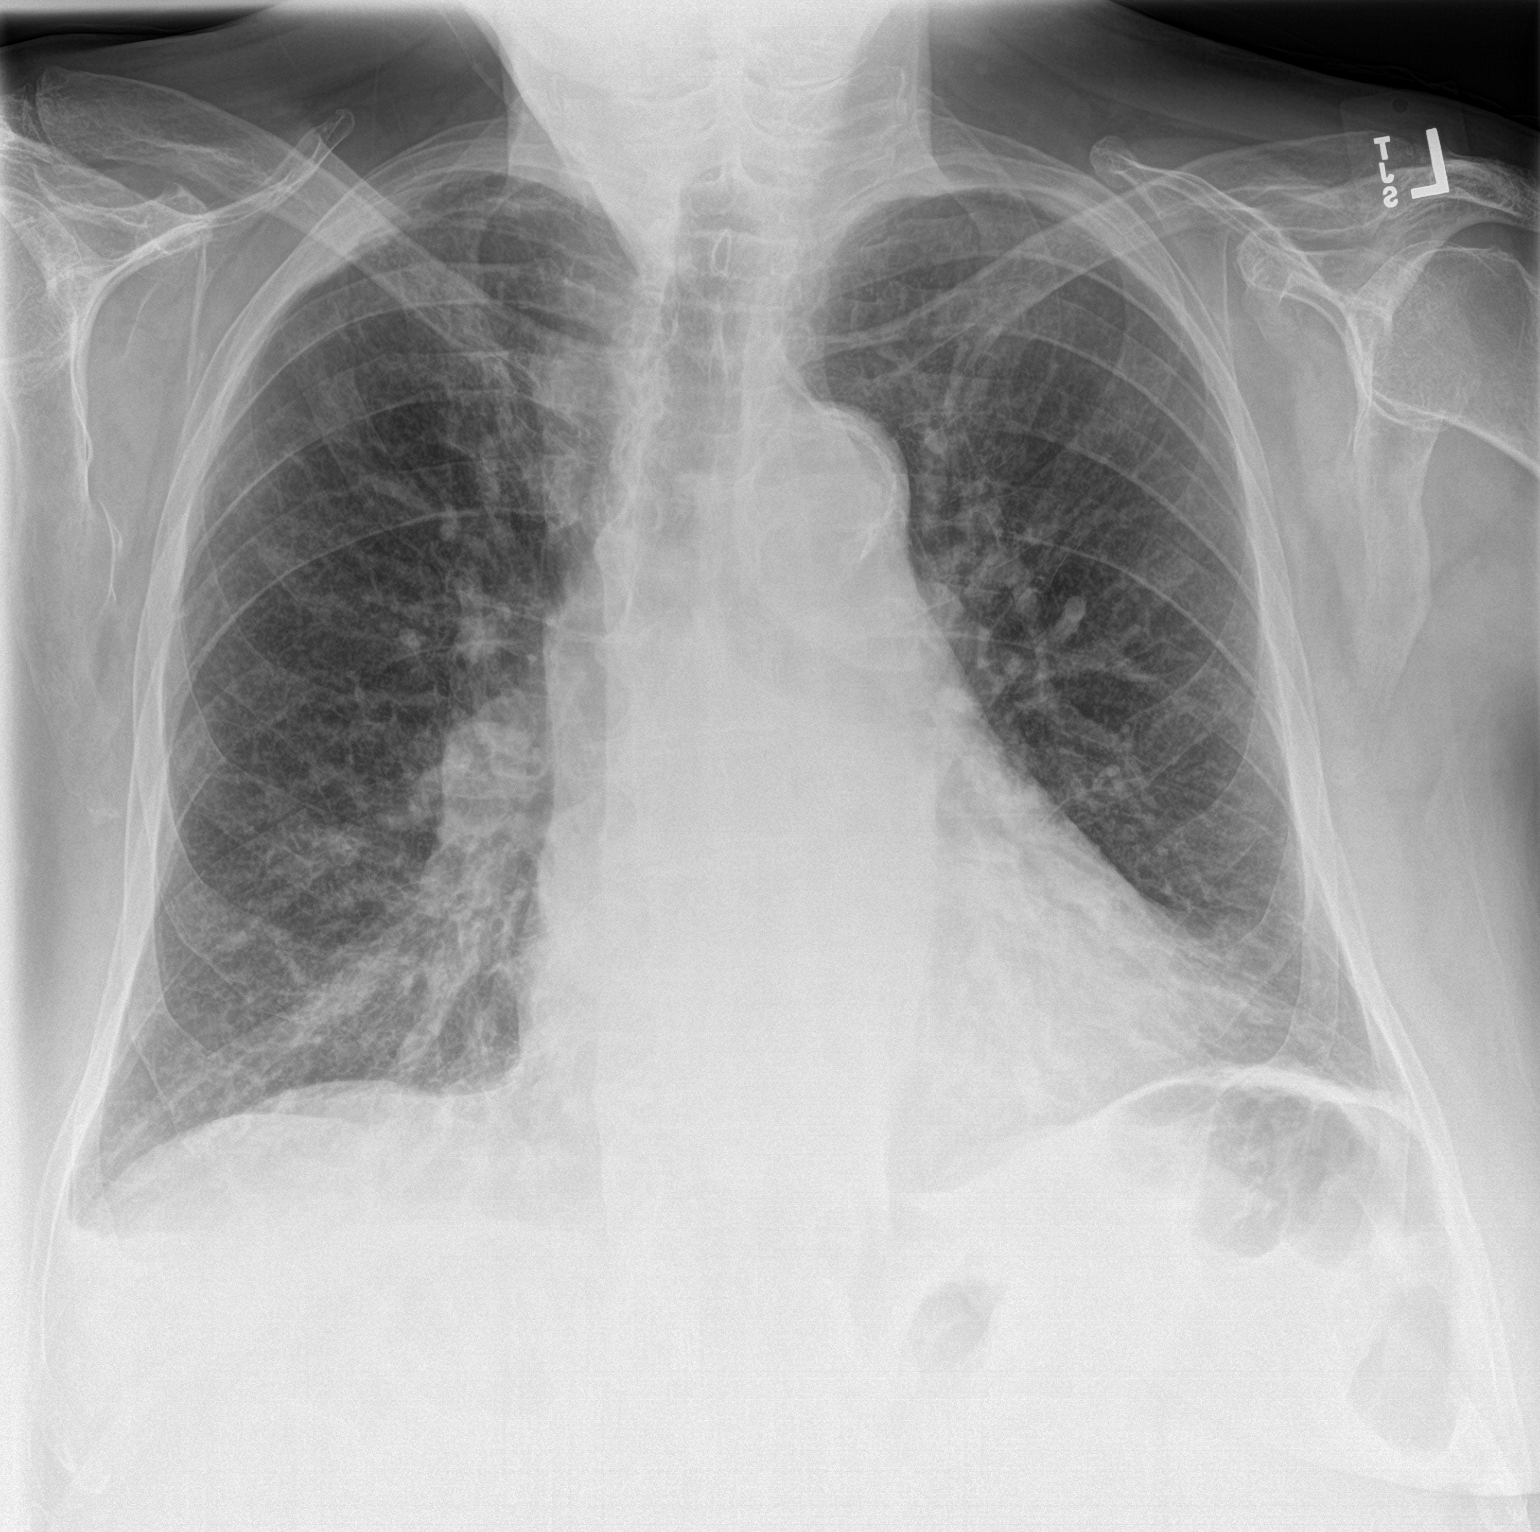
[im 2/2]
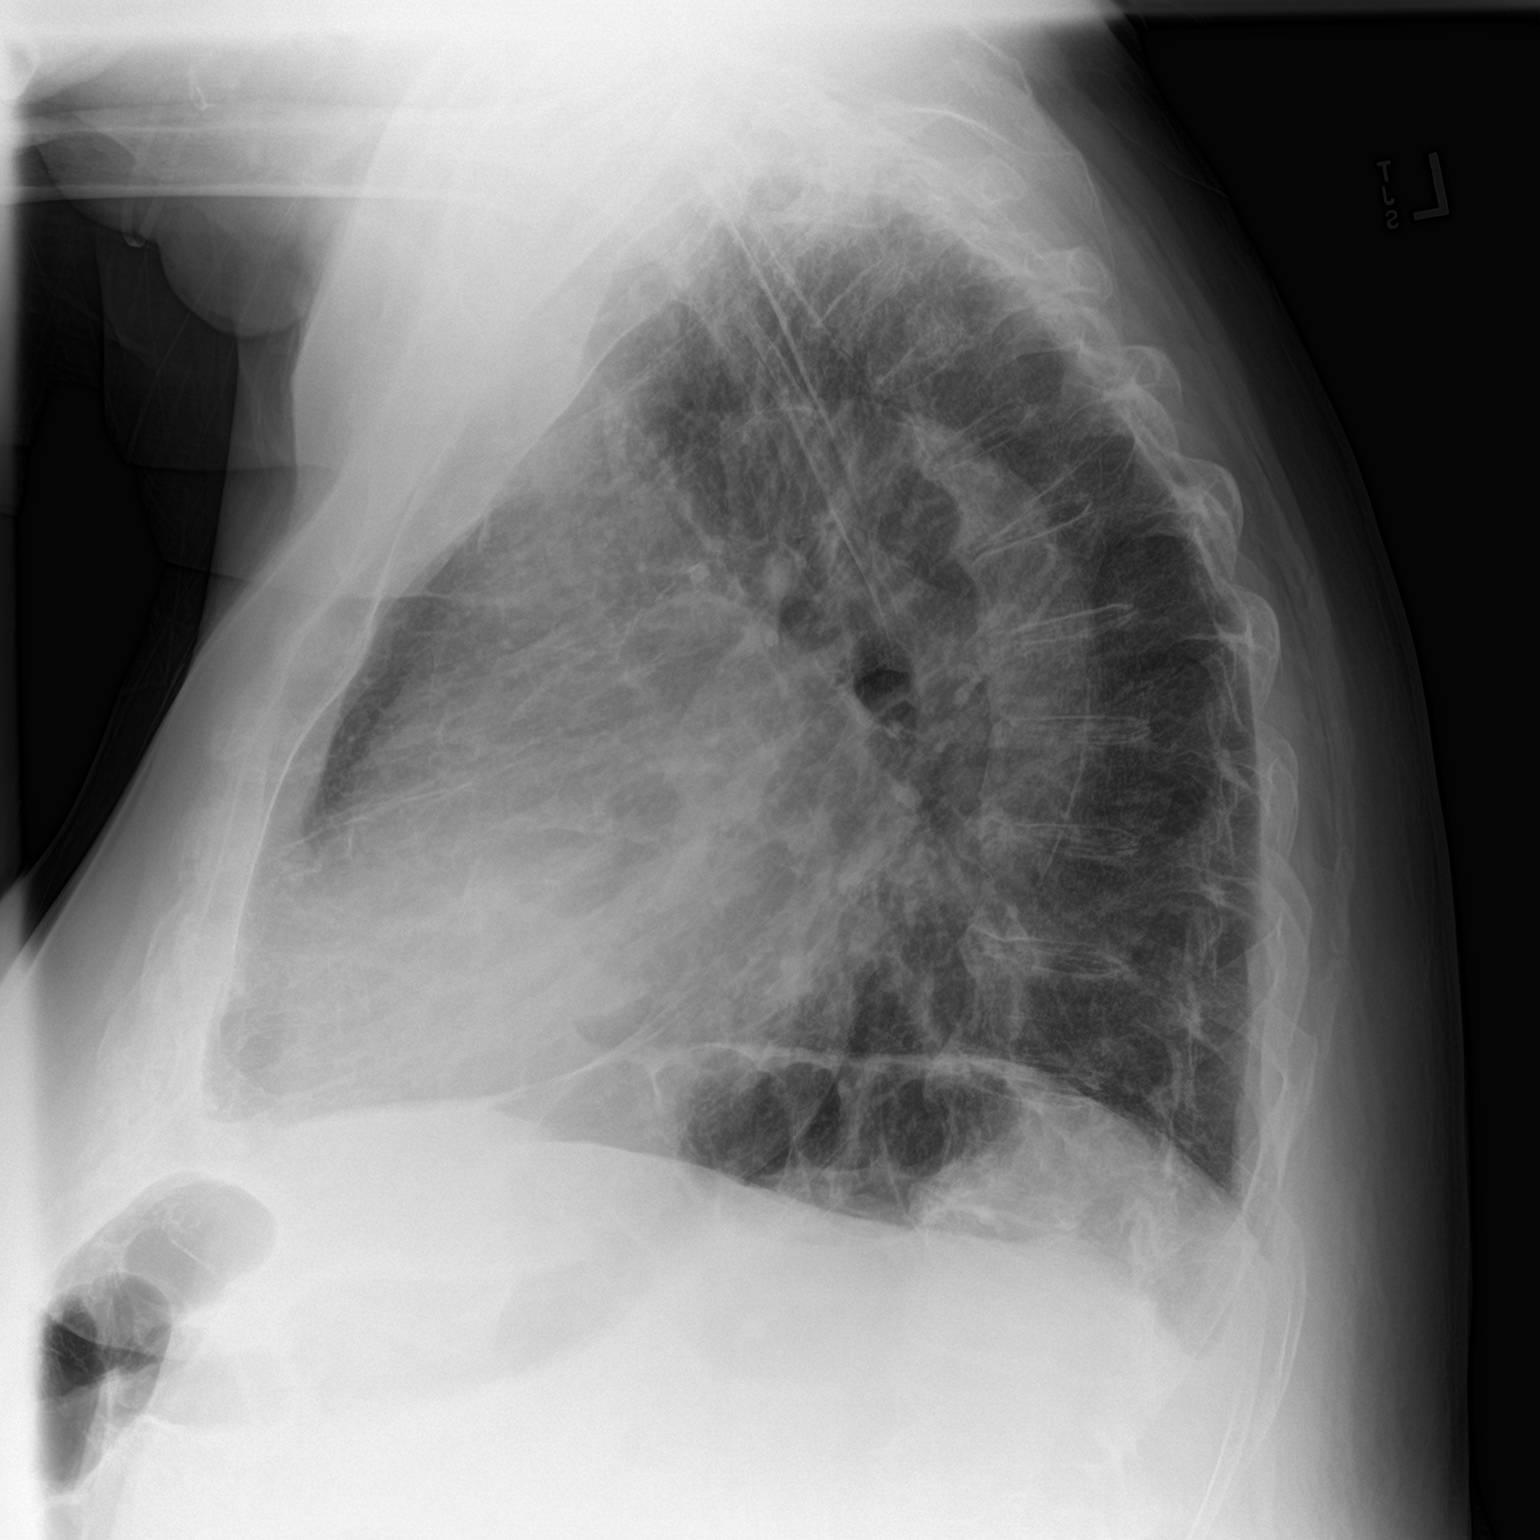

[2 of 2 positions shown; findings below may reference images not displayed]

FINDINGS: Emphysema with diffuse bronchitic changes but no focal airspace
disease, pleural effusion or pneumothorax. Stable cardiomediastinal
silhouette. Aortic atherosclerosis.
IMPRESSION: Emphysema with bronchitic changes.  No focal airspace disease.

## 2022-10-30 ENCOUNTER — Encounter: Payer: Self-pay | Admitting: Family Medicine

## 2022-11-04 ENCOUNTER — Other Ambulatory Visit: Payer: Self-pay | Admitting: Neurology

## 2022-11-04 ENCOUNTER — Ambulatory Visit: Payer: PPO

## 2022-11-04 DIAGNOSIS — R4189 Other symptoms and signs involving cognitive functions and awareness: Secondary | ICD-10-CM

## 2022-11-04 NOTE — Progress Notes (Unsigned)
Care Management & Coordination Services Pharmacy Note  11/04/2022 Name:  Jared Rice MRN:  [id number] DOB:  [birth year]  Summary: Patient presents for follow-up consult.   He has not started the b12 supplement or the Metamucil recommended by his GI. His diarrhea symptoms seem to have resolved at this time.   Recommendations/Changes made from today's visit: ***  Follow up plan: ***   Patient-Specific Goals:  Subjective: Jared Rice is an 89 y.o. year old male who is a primary patient of Simmons-Robinson, Makiera, MD.  The care coordination team was consulted for assistance with disease management and care coordination needs.    Engaged with patient by telephone for follow up visit.  Recent office visits: 10/02/22: Patient presented to Merita Norton, FNP for skin tear.  09/25/22: Patient presented to Merita Norton, FNP for skin tear.  09/12/22: Patient presented to Dr. Neita Garnet for skin tear.   Recent consult visits: 10/15/22: Patient presented to Celso Amy, PA-C (GI) for initial consult.  09/12/22: Patient presented to Michiel Cowboy, PA-C (Urology) for follow-up.  09/06/22: Patient presented to Dr. Sherryll Burger (Neurology) for follow-up.  Hospital visits: {Hospital DC Yes/No:25215}   Objective:  Lab Results  Component Value Date   CREATININE 1.17 06/05/2022   BUN 17 06/05/2022   EGFR 60 06/05/2022   GFRNONAA >60 08/24/2020   GFRAA 74 07/05/2020   NA 145 (H) 06/05/2022   K 4.3 06/05/2022   CALCIUM 9.0 06/05/2022   CO2 27 06/05/2022   GLUCOSE 97 06/05/2022    Lab Results  Component Value Date/Time   HGBA1C 5.9 (H) 06/05/2022 10:21 AM   HGBA1C 5.8 (H) 08/15/2021 08:42 AM    Last diabetic Eye exam:  Lab Results  Component Value Date/Time   HMDIABEYEEXA No Retinopathy 04/27/2019 12:00 AM    Last diabetic Foot exam: No results found for: "HMDIABFOOTEX"   Lab Results  Component Value Date   CHOL 122 06/05/2022   HDL 44 06/05/2022   LDLCALC 68 06/05/2022    TRIG 39 06/05/2022   CHOLHDL 2.8 06/05/2022       Latest Ref Rng & Units 06/05/2022   10:21 AM 10/26/2021   11:19 AM 08/15/2021    8:45 AM  Hepatic Function  Total Protein 6.0 - 8.5 g/dL 5.9  6.0  5.9   Albumin 3.7 - 4.7 g/dL 3.9  4.1  3.9   AST 0 - 40 IU/L 6  8  11    ALT 0 - 44 IU/L 6  7  7    Alk Phosphatase 44 - 121 IU/L 65  64  63   Total Bilirubin 0.0 - 1.2 mg/dL 0.4  0.6  0.3     Lab Results  Component Value Date/Time   TSH 4.640 (H) 06/05/2022 10:21 AM   TSH 5.360 (H) 10/26/2021 11:19 AM   FREET4 1.09 06/05/2022 10:21 AM   FREET4 1.20 10/26/2021 11:19 AM       Latest Ref Rng & Units 06/05/2022   10:21 AM 10/26/2021   11:19 AM 07/19/2021    3:56 PM  CBC  WBC 3.4 - 10.8 x10E3/uL 6.0  6.5  6.8   Hemoglobin 13.0 - 17.7 g/dL 40.9  81.1  91.4   Hematocrit 37.5 - 51.0 % 38.2  36.1  34.6   Platelets 150 - 450 x10E3/uL 164  179  162     Lab Results  Component Value Date/Time   VITAMINB12 315 11/29/2015 12:10 PM    Clinical ASCVD: {YES/NO:21197} The ASCVD Risk score (Arnett DK,  et al., 2019) failed to calculate for the following reasons:   The 2019 ASCVD risk score is only valid for ages 19 to 33    ***Other: (CHADS2VASc if Afib, MMRC or CAT for COPD, ACT, DEXA)     10/08/2022    2:10 PM 09/12/2022    3:07 PM 07/17/2022    3:02 PM  Depression screen PHQ 2/9  Decreased Interest 0 0 0  Down, Depressed, Hopeless 0 0 0  PHQ - 2 Score 0 0 0  Altered sleeping  0 0  Tired, decreased energy  0 0  Change in appetite  0 0  Feeling bad or failure about yourself   0 0  Trouble concentrating  0 0  Moving slowly or fidgety/restless  0 0  Suicidal thoughts  0 0  PHQ-9 Score  0 0  Difficult doing work/chores  Not difficult at all Not difficult at all     Social History   Tobacco Use  Smoking Status Former   Packs/day: 2.00   Years: 31.00   Additional pack years: 0.00   Total pack years: 62.00   Types: Cigarettes   Quit date: 12/24/1980   Years since quitting: 41.8   Smokeless Tobacco Never  Tobacco Comments   quit in 1982   BP Readings from Last 3 Encounters:  10/15/22 (!) 181/95  10/02/22 (!) 161/96  09/25/22 (!) 140/66   Pulse Readings from Last 3 Encounters:  10/15/22 71  10/02/22 82  09/25/22 79   Wt Readings from Last 3 Encounters:  10/15/22 245 lb 6.4 oz (111.3 kg)  10/02/22 252 lb (114.3 kg)  09/25/22 252 lb 14.4 oz (114.7 kg)   BMI Readings from Last 3 Encounters:  10/15/22 32.38 kg/m  10/02/22 32.35 kg/m  09/25/22 32.47 kg/m    Allergies  Allergen Reactions   Dexilant [Dexlansoprazole] Rash   Primidone Itching and Rash    Medications Reviewed Today     Reviewed by Celso Amy, PA-C (Physician Assistant) on 10/15/22 at 1651  Med List Status: <None>   Medication Order Taking? Sig Documenting Provider Last Dose Status Informant  apixaban (ELIQUIS) 5 MG TABS tablet [id number] Yes Take 1 tablet by mouth twice a day [place], Peter M, MD Taking Active   cyanocobalamin (VITAMIN B12) 1000 MCG tablet [id number] Yes Take 1 tablet (1,000 mcg total) by mouth daily. Celso Amy, PA-C  Active   diphenhydramine-acetaminophen (TYLENOL PM) 25-500 MG TABS [id number] Yes Take 2 tablets by mouth at bedtime. [provider] Taking Active Self  doxazosin (CARDURA) 8 MG tablet [id number] Yes Take 1 tablet by mouth once daily Simmons-Robinson, Makiera, MD Taking Active   furosemide (LASIX) 40 MG tablet [id number] Yes Take 1 tablet by mouth once daily (dose change) Drubel, Lillia Abed, PA-C Taking Active   levothyroxine (SYNTHROID) 88 MCG tablet [id number] Yes TAKE 1 TABLET BY MOUTH ONCE DAILY IN THE MORNING BEFORE BREAKFAST Simmons-Robinson, Makiera, MD Taking Active   psyllium (METAMUCIL SMOOTH TEXTURE) 58.6 % powder [id number] Yes Take 1 packet by mouth daily. Mix in a Drink. Celso Amy, PA-C  Active   ramipril (ALTACE) 5 MG capsule [id number] Yes Take 1 capsule by mouth once daily Bosie Clos, MD Taking Active   rosuvastatin  (CRESTOR) 10 MG tablet [id number] Yes Take 1 tablet by mouth once daily Simmons-Robinson, Makiera, MD Taking Active   topiramate (TOPAMAX) 50 MG tablet [id number] Yes Take 1 tablet by mouth twice daily Fisher, Demetrios Isaacs, MD Taking Active  SDOH:  (Social Determinants of Health) assessments and interventions performed: {yes/no:20286} SDOH Interventions    Flowsheet Row Care Coordination from 10/08/2022 in [facility] Coordination Care Coordination from 10/07/2022 in [facility] Clinical Support from 06/06/2022 in [facility] Clinical Support from 06/05/2021 in [facility] Chronic Care Management from 12/08/2020 in [facility] Chronic Care Management from 06/09/2020 in [facility]  SDOH Interventions        Food Insecurity Interventions Intervention Not Indicated Intervention Not Indicated Intervention Not Indicated Intervention Not Indicated -- --  Housing Interventions Intervention Not Indicated Intervention Not Indicated Intervention Not Indicated Intervention Not Indicated -- --  Transportation Interventions Intervention Not Indicated Intervention Not Indicated Intervention Not Indicated Intervention Not Indicated -- Intervention Not Indicated  Utilities Interventions -- -- Intervention Not Indicated -- -- --  Financial Strain Interventions -- -- Intervention Not Indicated Intervention Not Indicated Intervention Not Indicated Intervention Not Indicated  Physical Activity Interventions -- -- Intervention Not Indicated Patient Refused -- --  Stress Interventions -- -- Intervention Not Indicated Intervention Not Indicated -- --  Social Connections Interventions -- -- Intervention Not Indicated Intervention Not Indicated -- --       Medication Assistance: {MEDASSISTANCEINFO:25044}  Medication Access: Within the  past 30 days, how often has patient missed a dose of medication? *** Is a pillbox or other method used to improve adherence? {YES/NO:21197} Factors that may affect medication adherence? {CHL DESC; BARRIERS:21522} Are meds synced by current pharmacy? {YES/NO:21197} Are meds delivered by current pharmacy? {YES/NO:21197} Does patient experience delays in picking up medications due to transportation concerns? {YES/NO:21197}  Compliance/Adherence/Medication fill history: Care Gaps: ***  Star-Rating Drugs: ***   Assessment/Plan  Hypertension (BP goal <140/90) -Controlled -Current treatment: Doxazosin 8 mg nightly: Appropriate, Effective, Safe, Accessible Furosemide 40 mg daily: Appropriate, Effective, Query safe Ramipril 5 mg daily: Appropriate, Effective, Safe, Accessible  -Medications previously tried: NA  -Current home readings:  He has not been monitoring his blood pressure at home routinely.  -Denies hypotension symptoms. -Current dietary habits: low sodium diet -Current exercise habits: NA -Continue current medications  Atrial Fibrillation (Goal: prevent stroke and major bleeding) -Controlled -CHADSVASC: 5 -Current treatment: Rate control: None Anticoagulation: Eliquis 5 mg twice daily: Appropriate, Effective, Safe, Accessible  -Medications previously tried: NA -Counseled on avoidance of NSAIDs due to increased bleeding risk with anticoagulants; -Recommended to continue current medication  Hyperlipidemia: (LDL goal < 70) -Controlled -Current treatment: Rosuvastatin 10 mg daily  -Medications previously tried: NA  -Educated on Importance of limiting foods high in cholesterol; -Recommended to continue current medication  Hypothyroidism (Goal: Maintain stable thyroid) -Controlled  -Current treatment  Levothyroxine 75 mcg daily  -Medications previously tried: NA  -Recommended to continue current medication -Continue current medications   Chronic Kidney Disease Stage  2-3a -All medications assessed for renal dosing and appropriateness in chronic kidney disease. -Will need to reduce Eliquis if creatinine > 1.5.  -Recommended to continue current medication    Follow Up Plan: {CM FOLLOW UP PLAN:22241}    ***

## 2022-11-12 ENCOUNTER — Ambulatory Visit: Payer: Self-pay | Admitting: *Deleted

## 2022-11-12 NOTE — Patient Outreach (Signed)
  Care Coordination   Follow Up Visit Note   11/13/2022 Name: Jared Rice MRN: [id number] DOB: January 01, 1933  Jared Rice is a 89 y.o. year old male who sees Simmons-Robinson, Tawanna Cooler, MD for primary care. I spoke with  Jared Rice by phone today.  What matters to the patients health and wellness today?  Continue chronic care management, attend MD appointments    Goals Addressed             This Visit's Progress    Effective management of HTN       Care Coordination Interventions: Evaluation of current treatment plan related to hypertension self management and patient's adherence to plan as established by provider Reviewed medications with patient and discussed importance of compliance Discussed plans with patient for ongoing care management follow up and provided patient with direct contact information for care management team Advised patient, providing education and rationale, to monitor blood pressure daily and record, calling PCP for findings outside established parameters Discussed complications of poorly controlled blood pressure such as heart disease, stroke, circulatory complications, vision complications, kidney impairment, sexual dysfunction          SDOH assessments and interventions completed:  No     Care Coordination Interventions:  Yes, provided   Interventions Today    Flowsheet Row Most Recent Value  Chronic Disease   Chronic disease during today's visit Hypertension (HTN)  General Interventions   General Interventions Discussed/Reviewed General Interventions Reviewed, Doctor Visits  Doctor Visits Discussed/Reviewed Doctor Visits Reviewed, PCP, Specialist  Gaylord Shih on 6/25, urology 7/18, neurology 7/24, PCP 7/30]  PCP/Specialist Visits Compliance with follow-up visit  Education Interventions   Education Provided Provided Education  Provided Verbal Education On Other  [Reminded to monitor blood pressure several times a week]       Follow up  plan: Follow up call scheduled for 8/6    Encounter Outcome:  Pt. Visit Completed   Kemper Durie, RN, MSN, [facility] Care Management Care Management Coordinator [phone]

## 2022-11-17 ENCOUNTER — Emergency Department
Admission: EM | Admit: 2022-11-17 | Discharge: 2022-11-17 | Disposition: A | Discharge status: Home / Self Care | Payer: PPO | Attending: Emergency Medicine | Admitting: Emergency Medicine

## 2022-11-17 ENCOUNTER — Other Ambulatory Visit: Payer: Self-pay

## 2022-11-17 DIAGNOSIS — W010XXA Fall on same level from slipping, tripping and stumbling without subsequent striking against object, initial encounter: Secondary | ICD-10-CM | POA: Insufficient documentation

## 2022-11-17 DIAGNOSIS — S51012A Laceration without foreign body of left elbow, initial encounter: Secondary | ICD-10-CM | POA: Insufficient documentation

## 2022-11-17 DIAGNOSIS — S81812A Laceration without foreign body, left lower leg, initial encounter: Secondary | ICD-10-CM | POA: Diagnosis not present

## 2022-11-17 DIAGNOSIS — Y92009 Unspecified place in unspecified non-institutional (private) residence as the place of occurrence of the external cause: Secondary | ICD-10-CM | POA: Diagnosis not present

## 2022-11-17 NOTE — ED Provider Notes (Signed)
   [facility] Provider Note    Event Date/Time   First MD Initiated Contact with Patient 11/17/22 1604     (approximate)   History   Fall   HPI  Jared Rice is a 89 y.o. male who presents to the emergency department today because of concerns for a fall and skin tears to the left elbow and left knee.  The patient tripped over a carpet.  He did not hit his head.  He denies any significant pain to these areas.     Physical Exam   Triage Vital Signs: ED Triage Vitals  Enc Vitals Group     BP 11/17/22 1602 (!) 154/86     Pulse Rate 11/17/22 1602 64     Resp 11/17/22 1602 16     Temp 11/17/22 1602 98 F (36.7 C)     Temp Source 11/17/22 1602 Oral     SpO2 11/17/22 1602 98 %     Weight 11/17/22 1600 244 lb 11.4 oz (111 kg)     Height 11/17/22 1600 6\' 1"  (1.854 m)     Head Circumference --      Peak Flow --      Pain Score 11/17/22 1600 10     Pain Loc --      Pain Edu? --      Excl. in GC? --     Most recent vital signs: Vitals:   11/17/22 1602  BP: (!) 154/86  Pulse: 64  Resp: 16  Temp: 98 F (36.7 C)  SpO2: 98%   General: Awake, alert, oriented. CV:  Good peripheral perfusion. Regular rate and rhythm. Resp:  Normal effort. Lungs clear. Abd:  No distention.  Other:  Skin tear to left elbow, skin tear to left knee   ED Results / Procedures / Treatments   Labs (all labs ordered are listed, but only abnormal results are displayed) Labs Reviewed - No data to display   EKG  None   RADIOLOGY None   PROCEDURES:  Critical Care performed: No    MEDICATIONS ORDERED IN ED: Medications - No data to display   IMPRESSION / MDM / ASSESSMENT AND PLAN / ED COURSE  I reviewed the triage vital signs and the nursing notes.                              Differential diagnosis includes, but is not limited to, skin tear, fracture  Patient's presentation is most consistent with acute presentation with potential threat to life or  bodily function.  Patient presented to the emergency department today after a fall and suffering skin tears.  On exam he does have skin tears to the left elbow and left knee.  Patient without any significant tenderness.  No deformities.  At this time I have low concern for underlying fracture.  Will have nursing staff dressed wounds.     FINAL CLINICAL IMPRESSION(S) / ED DIAGNOSES   Final diagnoses:  Skin tear of left elbow without complication, initial encounter  Skin tear of left lower leg without complication, initial encounter     Note:  This document was prepared using Dragon voice recognition software and may include unintentional dictation errors.    Phineas Semen, MD 11/17/22 Zollie Pee

## 2022-11-17 NOTE — ED Triage Notes (Signed)
Pt to ed from home via POV for a fall. He tripped at home and landed on the carpet. Pt denies hitting his head. He has large skin tears on his left knee and left outer arm. Pt is CAOx4, in no acute distress and placed in wheel chair and moved straight to room. Pt is on blood thinners.

## 2022-11-18 ENCOUNTER — Emergency Department
Admission: EM | Admit: 2022-11-18 | Discharge: 2022-11-18 | Disposition: A | Discharge status: Home / Self Care | Payer: PPO | Attending: Emergency Medicine | Admitting: Emergency Medicine

## 2022-11-18 ENCOUNTER — Other Ambulatory Visit: Payer: Self-pay

## 2022-11-18 DIAGNOSIS — Z4801 Encounter for change or removal of surgical wound dressing: Secondary | ICD-10-CM | POA: Diagnosis not present

## 2022-11-18 DIAGNOSIS — Z5321 Procedure and treatment not carried out due to patient leaving prior to being seen by health care provider: Secondary | ICD-10-CM | POA: Insufficient documentation

## 2022-11-18 NOTE — ED Triage Notes (Signed)
Pt comes with c/o wound check to left elbow. Pt has large skin tear noted with bleeding controlled. Pt states he just needs it dressed again and looked at.   Pt was already seen and evaluated prior for it and his fall yesterday.

## 2022-11-21 DIAGNOSIS — S50312D Abrasion of left elbow, subsequent encounter: Secondary | ICD-10-CM | POA: Diagnosis not present

## 2022-11-21 DIAGNOSIS — Z471 Aftercare following joint replacement surgery: Secondary | ICD-10-CM | POA: Diagnosis not present

## 2022-11-21 DIAGNOSIS — M7652 Patellar tendinitis, left knee: Secondary | ICD-10-CM | POA: Diagnosis not present

## 2022-11-21 DIAGNOSIS — S8992XD Unspecified injury of left lower leg, subsequent encounter: Secondary | ICD-10-CM | POA: Diagnosis not present

## 2022-11-21 DIAGNOSIS — Z7901 Long term (current) use of anticoagulants: Secondary | ICD-10-CM | POA: Diagnosis not present

## 2022-11-21 DIAGNOSIS — R58 Hemorrhage, not elsewhere classified: Secondary | ICD-10-CM | POA: Diagnosis not present

## 2022-11-21 DIAGNOSIS — Z96652 Presence of left artificial knee joint: Secondary | ICD-10-CM | POA: Diagnosis not present

## 2022-11-21 DIAGNOSIS — S59902A Unspecified injury of left elbow, initial encounter: Secondary | ICD-10-CM | POA: Diagnosis not present

## 2022-11-21 DIAGNOSIS — L089 Local infection of the skin and subcutaneous tissue, unspecified: Secondary | ICD-10-CM | POA: Diagnosis not present

## 2022-11-21 DIAGNOSIS — S81002D Unspecified open wound, left knee, subsequent encounter: Secondary | ICD-10-CM | POA: Diagnosis not present

## 2022-11-21 DIAGNOSIS — S80212D Abrasion, left knee, subsequent encounter: Secondary | ICD-10-CM | POA: Diagnosis not present

## 2022-11-21 DIAGNOSIS — S8992XA Unspecified injury of left lower leg, initial encounter: Secondary | ICD-10-CM | POA: Diagnosis not present

## 2022-11-21 DIAGNOSIS — S59902D Unspecified injury of left elbow, subsequent encounter: Secondary | ICD-10-CM | POA: Diagnosis not present

## 2022-11-21 DIAGNOSIS — M778 Other enthesopathies, not elsewhere classified: Secondary | ICD-10-CM | POA: Diagnosis not present

## 2022-11-25 ENCOUNTER — Encounter: Payer: Self-pay | Admitting: Family Medicine

## 2022-11-25 ENCOUNTER — Ambulatory Visit (INDEPENDENT_AMBULATORY_CARE_PROVIDER_SITE_OTHER): Payer: PPO | Admitting: Family Medicine

## 2022-11-25 VITALS — BP 117/76 | HR 60 | Temp 97.6°F | Ht 74.0 in | Wt 254.0 lb

## 2022-11-25 DIAGNOSIS — S81812S Laceration without foreign body, left lower leg, sequela: Secondary | ICD-10-CM

## 2022-11-25 DIAGNOSIS — S51812D Laceration without foreign body of left forearm, subsequent encounter: Secondary | ICD-10-CM

## 2022-11-25 DIAGNOSIS — S59902D Unspecified injury of left elbow, subsequent encounter: Secondary | ICD-10-CM | POA: Diagnosis not present

## 2022-11-25 MED ORDER — SILVER SULFADIAZINE 1 % EX CREA
TOPICAL_CREAM | CUTANEOUS | 1 refills | Status: DC
Start: 2022-11-25 — End: 2023-07-02

## 2022-11-25 NOTE — Patient Instructions (Signed)
.   Please review the attached list of medications and notify my office if there are any errors.   . Please bring all of your medications to every appointment so we can make sure that our medication list is the same as yours.   

## 2022-11-25 NOTE — Progress Notes (Signed)
      Established patient visit   Patient: Jared Rice   DOB: [birth year]   89 y.o. Male  MRN: [id number] Visit Date: 11/25/2022  Today's healthcare provider: Mila Merry, MD   Chief Complaint  Patient presents with   hopital follow-up    Left knee, left arm --open wound, bleeding   Subjective    Discussed the use of AI scribe software for clinical note transcription with the patient, who gave verbal consent to proceed.  History of Present Illness   The patient, with a history of falls, presents for a follow-up visit after sustaining skin tears to his knee and elbow from a fall at home on 11/17/2022. The fall occurred when the patient tripped on a rug transitioning from the kitchen to the den. The patient sought immediate care at the emergency room on the day of the incident and returned for a follow-up visit four days later due to issues with the wound dressings. The patient reports discomfort but denies experiencing significant pain. The wounds have been dressed and monitored for at least a week, with no signs of infection reported.       Medications: Outpatient Medications Prior to Visit  Medication Sig   apixaban (ELIQUIS) 5 MG TABS tablet Take 1 tablet by mouth twice a day   cyanocobalamin (VITAMIN B12) 1000 MCG tablet Take 1 tablet (1,000 mcg total) by mouth daily.   diphenhydramine-acetaminophen (TYLENOL PM) 25-500 MG TABS Take 2 tablets by mouth at bedtime.   doxazosin (CARDURA) 8 MG tablet Take 1 tablet by mouth once daily   furosemide (LASIX) 40 MG tablet Take 1 tablet by mouth once daily (dose change)   levothyroxine (SYNTHROID) 88 MCG tablet TAKE 1 TABLET BY MOUTH ONCE DAILY IN THE MORNING BEFORE BREAKFAST   psyllium (METAMUCIL SMOOTH TEXTURE) 58.6 % powder Take 1 packet by mouth daily. Mix in a Drink.   ramipril (ALTACE) 5 MG capsule Take 1 capsule by mouth once daily   rosuvastatin (CRESTOR) 10 MG tablet Take 1 tablet by mouth once daily   topiramate (TOPAMAX) 50  MG tablet Take 1 tablet by mouth twice daily   No facility-administered medications prior to visit.   Review of Systems     Objective    BP 117/76 (BP Location: Right Arm, Patient Position: Sitting, Cuff Size: Large)   Pulse 60   Temp 97.6 F (36.4 C)   Ht 6\' 2"  (1.88 m)   Wt 254 lb (115.2 kg)   SpO2 98%   BMI 32.61 kg/m    Physical Exam  Physical Exam       Assessment & Plan     1. Skin tear of forearm without complication, left, subsequent encounter Improving, no sign infection.   2. Skin tear of lower leg without complication, left, sequela Deep wound, very slowly healing. Does not appear acutely infection.  Fresh sterile Silvadene under Telfa dressing change to arm and knee today.  - silver sulfADIAZINE (SILVADENE) 1 % cream; Apply to affected area every 2-3 days with dressing changes.  Dispense: 85 g; Refill: 1  Call if symptoms change or if not rapidly improving.          Mila Merry, MD  [facility] [phone] (phone) [phone] (fax)  [facility]

## 2022-11-26 DIAGNOSIS — S59902D Unspecified injury of left elbow, subsequent encounter: Secondary | ICD-10-CM | POA: Diagnosis not present

## 2022-11-26 DIAGNOSIS — R58 Hemorrhage, not elsewhere classified: Secondary | ICD-10-CM | POA: Diagnosis not present

## 2022-11-26 DIAGNOSIS — S8992XD Unspecified injury of left lower leg, subsequent encounter: Secondary | ICD-10-CM | POA: Diagnosis not present

## 2022-11-26 DIAGNOSIS — L089 Local infection of the skin and subcutaneous tissue, unspecified: Secondary | ICD-10-CM | POA: Diagnosis not present

## 2022-11-26 DIAGNOSIS — S80212D Abrasion, left knee, subsequent encounter: Secondary | ICD-10-CM | POA: Diagnosis not present

## 2022-11-29 ENCOUNTER — Other Ambulatory Visit: Payer: Self-pay | Admitting: Family Medicine

## 2022-11-29 DIAGNOSIS — S51002D Unspecified open wound of left elbow, subsequent encounter: Secondary | ICD-10-CM | POA: Diagnosis not present

## 2022-12-02 DIAGNOSIS — S8992XD Unspecified injury of left lower leg, subsequent encounter: Secondary | ICD-10-CM | POA: Diagnosis not present

## 2022-12-02 DIAGNOSIS — S51002D Unspecified open wound of left elbow, subsequent encounter: Secondary | ICD-10-CM | POA: Diagnosis not present

## 2022-12-02 DIAGNOSIS — L089 Local infection of the skin and subcutaneous tissue, unspecified: Secondary | ICD-10-CM | POA: Diagnosis not present

## 2022-12-02 DIAGNOSIS — S59902D Unspecified injury of left elbow, subsequent encounter: Secondary | ICD-10-CM | POA: Diagnosis not present

## 2022-12-02 DIAGNOSIS — S80212D Abrasion, left knee, subsequent encounter: Secondary | ICD-10-CM | POA: Diagnosis not present

## 2022-12-02 NOTE — Telephone Encounter (Signed)
Requested Prescriptions  Pending Prescriptions Disp Refills   topiramate (TOPAMAX) 50 MG tablet [Pharmacy Med Name: Topiramate 50 MG Oral Tablet] 180 tablet 3    Sig: Take 1 tablet by mouth twice daily     Neurology: Anticonvulsants - topiramate & zonisamide Passed - 11/29/2022  9:02 PM      Passed - Cr in normal range and within 360 days    Creatinine  Date Value Ref Range Status  10/13/2013 0.87 0.60 - 1.30 mg/dL Final   Creatinine, Ser  Date Value Ref Range Status  06/05/2022 1.17 0.76 - 1.27 mg/dL Final         Passed - CO2 in normal range and within 360 days    CO2  Date Value Ref Range Status  06/05/2022 27 20 - 29 mmol/L Final   Co2  Date Value Ref Range Status  10/13/2013 30 21 - 32 mmol/L Final         Passed - ALT in normal range and within 360 days    ALT  Date Value Ref Range Status  06/05/2022 6 0 - 44 IU/L Final         Passed - AST in normal range and within 360 days    AST  Date Value Ref Range Status  06/05/2022 6 0 - 40 IU/L Final         Passed - Completed PHQ-2 or PHQ-9 in the last 360 days      Passed - Valid encounter within last 12 months    Recent Outpatient Visits           1 week ago Skin tear of forearm without complication, left, subsequent encounter   [facility] Malva Limes, MD   1 month ago Skin tear of forearm without complication, left, subsequent encounter   [facility] Merita Norton T, FNP   2 months ago Skin tear of forearm without complication, left, subsequent encounter   [facility] Merita Norton T, FNP   2 months ago Skin tear of forearm without complication, left, subsequent encounter   [facility] Merita Norton T, FNP   2 months ago Skin tear of forearm without complication, left, subsequent encounter   [facility] Simmons-Robinson, Tawanna Cooler, MD       Future Appointments              In 1 week McGowan, Elana Alm [facility]    In 3 weeks Simmons-Robinson, Tawanna Cooler, MD [name], [place]   In 1 month Deirdre Evener, MD [facility]

## 2022-12-04 DIAGNOSIS — S51002D Unspecified open wound of left elbow, subsequent encounter: Secondary | ICD-10-CM | POA: Diagnosis not present

## 2022-12-05 ENCOUNTER — Other Ambulatory Visit: Payer: Self-pay | Admitting: Family Medicine

## 2022-12-05 DIAGNOSIS — E039 Hypothyroidism, unspecified: Secondary | ICD-10-CM

## 2022-12-06 ENCOUNTER — Encounter: Payer: PPO | Attending: Physician Assistant | Admitting: Physician Assistant

## 2022-12-06 DIAGNOSIS — Z96652 Presence of left artificial knee joint: Secondary | ICD-10-CM | POA: Diagnosis not present

## 2022-12-06 DIAGNOSIS — I480 Paroxysmal atrial fibrillation: Secondary | ICD-10-CM | POA: Diagnosis not present

## 2022-12-06 DIAGNOSIS — I482 Chronic atrial fibrillation, unspecified: Secondary | ICD-10-CM | POA: Diagnosis not present

## 2022-12-06 DIAGNOSIS — E119 Type 2 diabetes mellitus without complications: Secondary | ICD-10-CM | POA: Insufficient documentation

## 2022-12-06 DIAGNOSIS — I872 Venous insufficiency (chronic) (peripheral): Secondary | ICD-10-CM | POA: Insufficient documentation

## 2022-12-06 DIAGNOSIS — Z09 Encounter for follow-up examination after completed treatment for conditions other than malignant neoplasm: Secondary | ICD-10-CM | POA: Diagnosis not present

## 2022-12-06 DIAGNOSIS — S81011A Laceration without foreign body, right knee, initial encounter: Secondary | ICD-10-CM | POA: Diagnosis not present

## 2022-12-06 DIAGNOSIS — I2510 Atherosclerotic heart disease of native coronary artery without angina pectoris: Secondary | ICD-10-CM | POA: Insufficient documentation

## 2022-12-06 DIAGNOSIS — Z6831 Body mass index (BMI) 31.0-31.9, adult: Secondary | ICD-10-CM | POA: Insufficient documentation

## 2022-12-06 DIAGNOSIS — J449 Chronic obstructive pulmonary disease, unspecified: Secondary | ICD-10-CM | POA: Diagnosis not present

## 2022-12-06 DIAGNOSIS — S51012A Laceration without foreign body of left elbow, initial encounter: Secondary | ICD-10-CM | POA: Diagnosis not present

## 2022-12-06 DIAGNOSIS — E669 Obesity, unspecified: Secondary | ICD-10-CM | POA: Insufficient documentation

## 2022-12-06 DIAGNOSIS — J4489 Other specified chronic obstructive pulmonary disease: Secondary | ICD-10-CM | POA: Diagnosis not present

## 2022-12-06 DIAGNOSIS — I10 Essential (primary) hypertension: Secondary | ICD-10-CM | POA: Diagnosis not present

## 2022-12-06 DIAGNOSIS — Z7901 Long term (current) use of anticoagulants: Secondary | ICD-10-CM | POA: Insufficient documentation

## 2022-12-06 DIAGNOSIS — I440 Atrioventricular block, first degree: Secondary | ICD-10-CM | POA: Diagnosis not present

## 2022-12-06 DIAGNOSIS — S81012A Laceration without foreign body, left knee, initial encounter: Secondary | ICD-10-CM | POA: Diagnosis not present

## 2022-12-06 DIAGNOSIS — Z87891 Personal history of nicotine dependence: Secondary | ICD-10-CM | POA: Insufficient documentation

## 2022-12-11 NOTE — Progress Notes (Deleted)
09/12/2022 2:29 PM   Jared Rice October 30, 1932 [id number]  Referring provider: Ronnald Ramp, MD [address] [place],  [place] [address]  Urological history 1. BPH with LU TS -PSA (06/2022) 3.5  -Doxazosin 8 mg daily and finasteride 5 mg daily  2.  Right hydrocele -Right hydrocelectomy (2015)  Chief Complaint  Patient presents with   Benign Prostatic Hypertrophy    HPI: Jared Rice is a 89 y.o. male who presents today for a three month follow up.     He did tear his arm on a towel rack and was seen in the ED.  He needs to have his bandages changed.    I PSS ***  PVR ***  mL     IPSS     Row Name 09/12/22 1400         International Prostate Symptom Score   How often have you had the sensation of not emptying your bladder? Not at All     How often have you had to urinate less than every two hours? Less than 1 in 5 times     How often have you found you stopped and started again several times when you urinated? Less than 1 in 5 times     How often have you found it difficult to postpone urination? Less than half the time     How often have you had a weak urinary stream? Less than 1 in 5 times     How often have you had to strain to start urination? Not at All     How many times did you typically get up at night to urinate? 1 Time     Total IPSS Score 6       Quality of Life due to urinary symptoms   If you were to spend the rest of your life with your urinary condition just the way it is now how would you feel about that? Mostly Satisfied               Score:  1-7 Mild 8-19 Moderate 20-35 Severe    PMH: Past Medical History:  Diagnosis Date   Actinic keratosis    Anginal pain    Aortic atherosclerosis    Arthritis    Atrial fibrillation    Basal cell carcinoma 02/06/2010   Left shoulder. Superficial.   Basal cell carcinoma 10/11/2013   Right medial forearm. Superficial   Basal cell carcinoma 04/10/2015    Right inf. lat. thigh. Superficial   Basal cell carcinoma 12/09/2016   Right cheek. Superficial.   Chronic airway obstruction, not elsewhere classified    Colon polyps    Complication of anesthesia    SPINAL DID NOT WORK FOR LAST KNEE REPLACEMENT AND HAD TO HAVE GA   Coronary atherosclerosis of native coronary artery    nonobstructive   Diabetes mellitus without complication    Essential hypertension, benign    GERD (gastroesophageal reflux disease)    H/O   Glucose intolerance (impaired glucose tolerance)    Heart murmur    History of kidney stones    Hyperlipidemia    Hypothyroidism    Iron deficiency anemia    Knee pain, right    LBBB (left bundle branch block)    Occlusion and stenosis of carotid artery without mention of cerebral infarction    wears compression stockings   Other dyspnea and respiratory abnormality    w/ pseuodowheeze resolves with purse lip manuever   Precordial  pain    Shoulder pain, right    Squamous cell carcinoma of skin 10/06/2007   Right forearm. SCCis   Squamous cell carcinoma of skin 10/11/2013   Left mid lat. pretibial. KA-like pattern.   Tremor    Unspecified sleep apnea    HAS NOT USED CPAP IN 20 YEARS   Venous insufficiency    Wears dentures    full upper   Wound cellulitis- right lower leg anterior  10/29/2019    Surgical History: Past Surgical History:  Procedure Laterality Date   CARDIAC CATHETERIZATION     X2   COLONOSCOPY  03/17/12   Dr Byrnett-diverticulosis   ESOPHAGOGASTRODUODENOSCOPY (EGD) WITH PROPOFOL N/A 11/25/2014   Procedure: ESOPHAGOGASTRODUODENOSCOPY (EGD) WITH PROPOFOL;  Surgeon: Midge Minium, MD;  Location: [facility];  Service: Endoscopy;  Laterality: N/A;  with biopsy   ESOPHAGOGASTRODUODENOSCOPY (EGD) WITH PROPOFOL N/A 01/05/2015   Procedure: ESOPHAGOGASTRODUODENOSCOPY (EGD) WITH PROPOFOL;  Surgeon: Midge Minium, MD;  Location: [facility];  Service: Endoscopy;  Laterality: N/A;   EYE SURGERY  Bilateral    cataract    KNEE ARTHROPLASTY Right 09/06/2020   Procedure: COMPUTER ASSISTED TOTAL KNEE ARTHROPLASTY;  Surgeon: Donato Heinz, MD;  Location: [facility];  Service: Orthopedics;  Laterality: Right;   TOTAL KNEE ARTHROPLASTY Left 2012    Home Medications:  Allergies as of 09/12/2022       Reactions   Dexilant [dexlansoprazole] Rash   Primidone Itching, Rash        Medication List        Accurate as of September 12, 2022  2:29 PM. If you have any questions, ask your nurse or doctor.          diphenhydramine-acetaminophen 25-500 MG Tabs tablet Commonly known as: TYLENOL PM Take 2 tablets by mouth at bedtime.   doxazosin 8 MG tablet Commonly known as: CARDURA Take 1 tablet by mouth once daily   Eliquis 5 MG Tabs tablet Generic drug: apixaban Take 1 tablet by mouth twice a day   furosemide 40 MG tablet Commonly known as: LASIX Take 1 tablet by mouth once daily (dose change)   levothyroxine 88 MCG tablet Commonly known as: SYNTHROID TAKE 1 TABLET BY MOUTH ONCE DAILY IN THE MORNING BEFORE BREAKFAST   ramipril 5 MG capsule Commonly known as: ALTACE Take 1 capsule by mouth once daily   rosuvastatin 10 MG tablet Commonly known as: CRESTOR Take 1 tablet by mouth once daily   topiramate 50 MG tablet Commonly known as: TOPAMAX Take 1 tablet by mouth twice daily        Allergies:  Allergies  Allergen Reactions   Dexilant [Dexlansoprazole] Rash   Primidone Itching and Rash    Family History: Family History  Problem Relation Age of Onset   Heart failure Mother 27       congestive   Heart attack Father 74   Alcohol abuse Father    Alzheimer's disease Brother 58   Alzheimer's disease Sister 7   Colon cancer Neg Hx    Liver disease Neg Hx     Social History:  reports that he quit smoking about 41 years ago. His smoking use included cigarettes. He has a 62.00 pack-year smoking history. He has never used smokeless tobacco. He reports that he does  not drink alcohol and does not use drugs.   Physical Exam: BP (!) 99/59   Pulse 90   Ht 6\' 1"  (1.854 m)   Wt 250 lb (113.4 kg)  BMI 32.98 kg/m   Constitutional:  Well nourished. Alert and oriented, No acute distress. HEENT: [place] AT, moist mucus membranes.  Trachea midline Cardiovascular: No clubbing, cyanosis, or edema. Respiratory: Normal respiratory effort, no increased work of breathing. GU: No CVA tenderness.  No bladder fullness or masses.  Patient with circumcised/uncircumcised phallus. ***Foreskin easily retracted***  Urethral meatus is patent.  No penile discharge. No penile lesions or rashes. Scrotum without lesions, cysts, rashes and/or edema.  Testicles are located scrotally bilaterally. No masses are appreciated in the testicles. Left and right epididymis are normal. Rectal: Patient with  normal sphincter tone. Anus and perineum without scarring or rashes. No rectal masses are appreciated. Prostate is approximately *** grams, *** nodules are appreciated. Seminal vesicles are normal. Neurologic: Grossly intact, no focal deficits, moving all 4 extremities. Psychiatric: Normal mood and affect.   Laboratory data: N/A  Pertinent imaging: ***  Assessment & Plan:    1. Benign prostatic hyperplasia with LUTS -Continue doxazosin and finasteride  2. Urgency -He is at goal taking the Gemtesa samples and since he does not take them daily, I have given him samples of Gemtesa so he can take them as he needs vs daily  3. Right hydrocele -Large right hydrocele.  He has no bothersome symptoms   Return in about 3 months (around 12/12/2022) for IPSS and PVR.   Cloretta Ned  [facility] [address], [address] [place], [place] [address] [phone]

## 2022-12-12 ENCOUNTER — Ambulatory Visit: Payer: PPO | Admitting: Urology

## 2022-12-12 ENCOUNTER — Encounter: Payer: PPO | Admitting: Physician Assistant

## 2022-12-12 DIAGNOSIS — N433 Hydrocele, unspecified: Secondary | ICD-10-CM

## 2022-12-12 DIAGNOSIS — S81012A Laceration without foreign body, left knee, initial encounter: Secondary | ICD-10-CM | POA: Diagnosis not present

## 2022-12-12 DIAGNOSIS — N401 Enlarged prostate with lower urinary tract symptoms: Secondary | ICD-10-CM

## 2022-12-12 DIAGNOSIS — R3915 Urgency of urination: Secondary | ICD-10-CM

## 2022-12-12 NOTE — Progress Notes (Signed)
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 There was Rice Chemical/Enzymatic/Mechanical debridement  performed by Jared Rice, Jared cytogeneticist, Jared Rice. With the following instrument(s): saline gauze. Other agent used was saline gauze. There was no bleeding. The procedure was tolerated well with Rice pain level of 0 throughout and Rice pain level of 0 following the procedure. Post Debridement Measurements: 6.5cm length x 8cm width x 0.2cm depth; 8.168cm^3 volume. Character of Wound/Ulcer Post Debridement is improved. Post procedure Diagnosis  Wound #2: Same as Pre-Procedure Wound #3 Pre-procedure diagnosis of Wound #3 is Rice Skin T located on the Left Elbow . There was Rice Chemical/Enzymatic/Mechanical debridement performed by Jared Rice, Jared cytogeneticist, Jared Rice. With the following instrument(s): saline gauze. Other agent used was saline gauze. There was no bleeding. The procedure was tolerated well with Rice pain level of 0 throughout and Rice pain level of 0 following the procedure. Post Debridement Measurements: 12cm length x 6cm width x 0.1cm depth; 5.655cm^3 volume. Character of Wound/Ulcer Post Debridement is improved. Post procedure Diagnosis Wound #3: Same as Pre-Procedure Plan Follow-up Appointments: Return Appointment in 1 week. Bathing/ Shower/ Hygiene: May shower with wound dressing protected with water repellent cover or cast protector. Anesthetic (Use 'Patient Medications' Section for Anesthetic Order Entry): Lidocaine applied to wound bed Edema Control - Lymphedema / Segmental Compressive Device / Other: Elevate, Exercise Daily and Avoid Standing for Long Periods of Time. Elevate legs to the level of the heart and pump ankles as often as possible Elevate leg(s) parallel to the floor when sitting. The following medication(s) was prescribed: doxycycline hyclate oral 100 mg capsule 1 1 capsule oral twice Rice day x 30 days starting 12/06/2022 WOUND #2: - Knee Wound Laterality: Left Cleanser: Soap and Water 3 x Per Week/30 Days Discharge Instructions: Gently cleanse wound with antibacterial soap, rinse and pat dry prior to dressing wounds Cleanser: Wound Cleanser 3 x Per Week/30 Days Discharge Instructions: Wash your hands with soap and water. Remove old dressing, discard into plastic bag and place into trash. Cleanse the wound with Wound Cleanser prior to applying Rice clean dressing using gauze sponges, not tissues or cotton balls. Do not scrub or use excessive force. Pat dry using gauze sponges, not tissue or cotton balls. Prim Dressing:  Xeroform 5x9-HBD (in/in) (DME) (Generic) 3 x Per Week/30 Days ary Discharge Instructions: Apply Xeroform 5x9-HBD (in/in) as directed Secondary Dressing: ABD Pad 5x9 (in/in) (DME) (Generic) 3 x Per Week/30 Days Discharge Instructions: Cover with ABD pad Secondary Dressing: Kerlix 4.5 x 4.1 (in/yd) (DME) (Generic) 3 x Per Week/30 Days Discharge Instructions: Apply Kerlix 4.5 x 4.1 (in/yd) as instructed Secured With: Medipore T - 68M Medipore H Soft Cloth Surgical T ape ape, 2x2 (in/yd) (DME) (Generic) 3 x Per Week/30 Days WOUND #3: - Elbow Wound Laterality: Left Cleanser: Soap and Water 3 x Per Week/30 Days Discharge Instructions: Gently cleanse wound with antibacterial soap, rinse and pat dry prior to dressing wounds Cleanser: Wound Cleanser 3 x Per Week/30 Days Discharge Instructions: Wash your hands with soap and water. Remove old dressing, discard into plastic bag and place into trash. Cleanse the wound with Wound Cleanser prior to applying Rice clean dressing using gauze sponges, not tissues or cotton balls. Do not scrub or use excessive force. Pat dry using gauze sponges, not tissue or cotton balls. Prim Dressing: Xeroform 5x9-HBD (in/in) (DME) (Generic) 3 x Per Week/30 Days ary Discharge Instructions: Apply Xeroform 5x9-HBD (in/in) as directed Secondary Dressing: ABD Pad 5x9 (in/in) (DME) (Generic) 3 x Per Week/30 Days Discharge Instructions: Cover with ABD pad Secondary Dressing: Kerlix 4.5 x 4.1 (in/yd) (DME) (  Generic) 3 x Per Week/30 Days Discharge Instructions: Apply Kerlix 4.5 x 4.1 (in/yd) as instructed Secured With: Medipore T - 109M Medipore H Soft Cloth Surgical T ape ape, 2x2 (in/yd) (DME) (Generic) 3 x Per Week/30 Days 1. I would recommend based on what I am seeing that we go ahead and have the patient continue to monitor for any evidence of infection or worsening. Based on what I am seeing I do think that he is making progress towards closure but I do believe that we can do some  things to help him out as well. He is in agreement with that plan. Subsequently I did go ahead and send in Rice prescription for doxycycline for him today and the patient is in agreement with that plan. Will see how that does over the next week. 2. I am also can recommend that the patient should continue to monitor for any evidence of infection or worsening. Obviously if anything changes he knows to contact the office and let me know 3. I am going to recommend as well that the patient should elevate his legs is much as possible to help with edema control I think this is still going to be Rice good option for him and honestly I did consider Rice compression wrapping the knee but unfortunately I think this is going to cause his leg to swell and again doing the leg I do not think is going to help with the knee area so we will work on him in Rice Catch-22. Nonetheless I believe at this point that the Xeroform gauze ABD pad JORON, VELIS Rice ([id number]) [id number] Page 10 of 12 and then try to elevate his legs much as possible this could be the best way to go. We will see patient back for reevaluation in 1 week here in the clinic. If anything worsens or changes patient will contact our office for additional recommendations. Electronic Signature(s) Signed: 12/06/2022 1:43:30 PM By: Jared Derry Jared Rice Entered By: Jared Rice on 12/06/2022 13:43:30 -------------------------------------------------------------------------------- ROS/PFSH Details Patient Name: Date of Service: Jared Rice MES Rice. 12/06/2022 11:30 [name] Medical Record Number: [id number] Patient Account Number: [id number] Date of Birth/Sex: Treating RN: [birth year] (89 y.o. Jared Rice) Yevonne Pax Primary Care Provider: Ronnald Ramp Other Clinician: Referring Provider: Treating Provider/Extender: Jared Rice Jared Rice, Jared Rice Weeks in Treatment: 0 Information Obtained From Patient Eyes Medical History: Positive  for: Cataracts - bilateral removal Negative for: Glaucoma; Optic Neuritis Ear/Nose/Mouth/Throat Medical History: Negative for: Chronic sinus problems/congestion; Middle ear problems Hematologic/Lymphatic Medical History: Negative for: Anemia; Hemophilia; Human Immunodeficiency Virus; Lymphedema; Sickle Cell Disease Respiratory Medical History: Negative for: Aspiration; Asthma; Chronic Obstructive Pulmonary Disease (COPD); Pneumothorax; Sleep Apnea; Tuberculosis Cardiovascular Medical History: Positive for: Arrhythmia; Coronary Artery Disease; Hypertension Negative for: Angina; Congestive Heart Failure; Deep Vein Thrombosis; Hypotension; Myocardial Infarction; Peripheral Arterial Disease; Peripheral Venous Disease; Phlebitis; Vasculitis Gastrointestinal Medical History: Negative for: Cirrhosis ; Colitis; Crohns; Hepatitis Rice; Hepatitis B; Hepatitis C Endocrine Medical History: Negative for: Type I Diabetes; Type II Diabetes Genitourinary Medical History: Negative for: End Stage Renal Disease ABRAHIM, SARGENT ([id number]) [id number] Page 11 of 12 Immunological Medical History: Negative for: Lupus Erythematosus; Raynauds; Scleroderma Integumentary (Skin) Medical History: Negative for: History of Burn; History of pressure wounds Musculoskeletal Medical History: Negative for: Gout; Rheumatoid Arthritis; Osteoarthritis; Osteomyelitis Neurologic Medical History: Negative for: Dementia; Neuropathy; Quadriplegia; Paraplegia; Seizure Disorder Oncologic Medical History: Negative for: Received Chemotherapy; Received Radiation Psychiatric Medical History: Negative for: Anorexia/bulimia; Confinement Anxiety HBO Extended History Items Eyes: Cataracts Immunizations Pneumococcal  Vaccine: Received Pneumococcal Vaccination: Yes Received Pneumococcal Vaccination On or After 60th Birthday: Yes Implantable Devices None Hospitalization / Surgery History Type  of Hospitalization/Surgery [facility] october 2020 Family and Social History Cancer: Yes - Child; Diabetes: No; Heart Disease: No; Hypertension: No; Kidney Disease: No; Lung Disease: No; Seizures: No; Stroke: No; Thyroid Problems: No; Tuberculosis: No; Former smoker - 40 years ago; Marital Status - Married; Alcohol Use: Never; Drug Use: No History; Caffeine Use: Daily; Financial Concerns: No; Food, Clothing or Shelter Needs: No; Support System Lacking: No; Transportation Concerns: No Electronic Signature(s) Signed: 12/06/2022 2:27:58 PM By: Jared Derry Jared Rice Signed: 12/12/2022 3:42:23 PM By: Yevonne Pax RN Entered By: Yevonne Pax on 12/06/2022 12:03:37 -------------------------------------------------------------------------------- SuperBill Details Patient Name: Date of Service: Tyler Pita, Fawn Kirk MES Rice. 12/06/2022 ARSAL, TAPPAN Rice ([id number]) [id number] Page 12 of 12 Medical Record Number: [id number] Patient Account Number: [id number] Date of Birth/Sex: Treating RN: 1933/03/12 (89 y.o. Jared Rice) Yevonne Pax Primary Care Provider: Ronnald Ramp Other Clinician: Referring Provider: Treating Provider/Extender: Audley Hose, Jared Rice Weeks in Treatment: 0 Diagnosis Coding ICD-10 Codes Code Description [phone] Laceration without foreign body, left knee, initial encounter S51.012A Laceration without foreign body of left elbow, initial encounter I48.0 Paroxysmal atrial fibrillation I10 Essential (primary) hypertension J44.89 Other specified chronic obstructive pulmonary disease I44.0 Atrioventricular block, first degree I25.10 Atherosclerotic heart disease of native coronary artery without angina pectoris Facility Procedures : CPT4 Code: [id number] Description: 99214 - WOUND CARE VISIT-LEV 4 EST PT Modifier: Quantity: 1 Physician Procedures : CPT4 Code Description Modifier [id number] - WC PHYS LEVEL 4 - NEW PT ICD-10 Diagnosis Description S81.012A  Laceration without foreign body, left knee, initial encounter S51.012A Laceration without foreign body of left elbow, initial encounter I48.0  Paroxysmal atrial fibrillation I10 Essential (primary) hypertension Quantity: 1 Electronic Signature(s) Signed: 12/06/2022 1:43:56 PM By: Jared Derry Jared Rice Previous Signature: 12/06/2022 12:56:01 PM Version By: Yevonne Pax RN Entered By: Jared Rice on 12/06/2022 13:43:56

## 2022-12-12 NOTE — Progress Notes (Signed)
Jared Rice ([id number]) [id number] Page 1 of 10 Visit Report for 12/12/2022 Arrival Information Details Patient Name: Date of Service: Jared Rice MES Rice. 12/12/2022 10:00 [name] Medical Record Number: [id number] Patient Account Number: [id number] Date of Birth/Sex: Treating RN: [birth year] (89 y.o. Jared Rice Primary Care Jared Rice: Jared Rice Other Clinician: Betha Rice Referring Jared Rice: Treating Jared Rice/Extender: Jared Rice in Treatment: 0 Visit Information History Since Last Visit All ordered tests and consults were completed: No Patient Arrived: Jared Rice Added or deleted any medications: No Arrival Time: 10:14 Any new allergies or adverse reactions: No Transfer Assistance: None Had Rice fall or experienced change in No Patient Identification Verified: Yes activities of daily living that may affect Secondary Verification Process Completed: Yes risk of falls: Patient Requires Transmission-Based Precautions: No Signs or symptoms of abuse/neglect since last visito No Patient Has Alerts: No Hospitalized since last visit: No Implantable device outside of the clinic excluding No cellular tissue based products placed in the center since last visit: Has Dressing in Place as Prescribed: Yes Pain Present Now: Yes Electronic Signature(s) Signed: 12/12/2022 12:00:53 PM By: Jared Rice Entered By: Jared Rice on 12/12/2022 10:16:40 -------------------------------------------------------------------------------- Clinic Level of Care Assessment Details Patient Name: Date of Service: Jared Rice MES Rice. 12/12/2022 10:00 [name] Medical Record Number: [id number] Patient Account Number: [id number] Date of Birth/Sex: Treating RN: 1932/08/20 (89 y.o. Jared Rice Primary Care Chakia Counts: Jared Rice Other Clinician: Betha Rice Referring Jared Rice: Treating Jared Rice/Extender: Jared Rice in Treatment: 0 Clinic Level of Care Assessment Items TOOL 1 Quantity Score []  - 0 Use when EandM and Procedure is performed on INITIAL visit ASSESSMENTS - Nursing Assessment / Reassessment []  - 0 General Physical Exam (combine w/ comprehensive assessment (listed just below) when performed on new pt. evals) []  - 0 Comprehensive Assessment (HX, ROS, Risk Assessments, Wounds Hx, etc.) QUADIR, MUNS Rice ([id number]) [id number] Page 2 of 10 ASSESSMENTS - Wound and Skin Assessment / Reassessment []  - 0 Dermatologic / Skin Assessment (not related to wound area) ASSESSMENTS - Ostomy and/or Continence Assessment and Care []  - 0 Incontinence Assessment and Management []  - 0 Ostomy Care Assessment and Management (repouching, etc.) PROCESS - Coordination of Care []  - 0 Simple Patient / Family Education for ongoing care []  - 0 Complex (extensive) Patient / Family Education for ongoing care []  - 0 Staff obtains Chiropractor, Records, T Results / Process Orders est []  - 0 Staff telephones HHA, Nursing Homes / Clarify orders / etc []  - 0 Routine Transfer to another Facility (non-emergent condition) []  - 0 Routine Hospital Admission (non-emergent condition) []  - 0 New Admissions / Manufacturing engineer / Ordering NPWT Apligraf, etc. , []  - 0 Emergency Hospital Admission (emergent condition) PROCESS - Special Needs []  - 0 Pediatric / Minor Patient Management []  - 0 Isolation Patient Management []  - 0 Hearing / Language / Visual special needs []  - 0 Assessment of Community assistance (transportation, D/C planning, etc.) []  - 0 Additional assistance / Altered mentation []  - 0 Support Surface(s) Assessment (bed, cushion, seat, etc.) INTERVENTIONS - Miscellaneous []  - 0 External ear exam []  - 0 Patient Transfer (multiple staff / Nurse, adult / Similar devices) []  - 0 Simple Staple / Suture removal (25 or less) []  - 0 Complex Staple / Suture  removal (26 or more) []  - 0 Hypo/Hyperglycemic Management (do not check if billed separately) []  - 0 Ankle / Brachial Index (ABI) - do not check if billed separately  Has the patient been seen at the hospital within the last three years: Yes Total Score: 0 Level Of Care: ____ Electronic Signature(s) Signed: 12/12/2022 12:00:53 PM By: Jared Rice Entered By: Jared Rice on 12/12/2022 10:52:38 -------------------------------------------------------------------------------- Encounter Discharge Information Details Patient Name: Date of Service: Jared Rice MES Rice. 12/12/2022 10:00 [name] Medical Record Number: [id number] Patient Account Number: [id number] Date of Birth/Sex: Treating RN: 17-May-1933 (89 y.o. Jared Rice Primary Care Alexys Gassett: Jared Rice Other Clinician: Betha Rice Referring Tirrell Buchberger: Treating Brinlynn Gorton/Extender: Jared Rice ([id number]) [id number] Page 3 of 10 Weeks in Treatment: 0 Encounter Discharge Information Items Post Procedure Vitals Discharge Condition: Stable Temperature (F): 97.4 Ambulatory Status: Cane Pulse (bpm): 79 Discharge Destination: Home Respiratory Rate (breaths/min): 18 Transportation: Private Auto Blood Pressure (mmHg): 179/99 Accompanied By: wife Schedule Follow-up Appointment: Yes Clinical Summary of Care: Electronic Signature(s) Signed: 12/12/2022 12:00:53 PM By: Jared Rice Entered By: Jared Rice on 12/12/2022 11:03:12 -------------------------------------------------------------------------------- Lower Extremity Assessment Details Patient Name: Date of Service: Jared Rice MES Rice. 12/12/2022 10:00 [name] Medical Record Number: [id number] Patient Account Number: [id number] Date of Birth/Sex: Treating RN: [birth year] (89 y.o. Jared Rice Primary Care Markee Remlinger: Jared Rice Other Clinician: Betha Rice Referring Bawi Lakins: Treating  Justinn Welter/Extender: Jared Rice in Treatment: 0 Electronic Signature(s) Signed: 12/12/2022 12:00:53 PM By: Jared Rice Signed: 12/12/2022 4:35:02 PM By: Angelina Pih Entered By: Jared Rice on 12/12/2022 10:26:43 -------------------------------------------------------------------------------- Multi Wound Chart Details Patient Name: Date of Service: Jared Rice MES Rice. 12/12/2022 10:00 [name] Medical Record Number: [id number] Patient Account Number: [id number] Date of Birth/Sex: Treating RN: 1932/10/22 (89 y.o. Jared Rice Primary Care Kenzlei Runions: Jared Rice Other Clinician: Betha Rice Referring Trinidee Schrag: Treating Kileen Lange/Extender: Jared Rice in Treatment: 0 Vital Signs Height(in): 73 Pulse(bpm): 79 Weight(lbs): 240 Blood Pressure(mmHg): 190/99 Body Mass Index(BMI): 31.7 Temperature(F): 97.4 Respiratory Rate(breaths/min): 18 [Lenhardt, Naomi Rice ([id number]):Photos:] [id number].pdf Page 4 of 10:2 3 N/Rice N/Rice] Left Knee Left Elbow N/Rice Wound Location: Trauma Trauma N/Rice Wounding Event: Skin T ear Skin T ear N/Rice Primary Etiology: Cataracts, Arrhythmia, Coronary Cataracts, Arrhythmia, Coronary N/Rice Comorbid History: Artery Disease, Hypertension Artery Disease, Hypertension 11/17/2022 11/17/2022 N/Rice Date Acquired: 0 0 N/Rice Weeks of Treatment: Open Open N/Rice Wound Status: No No N/Rice Wound Recurrence: 2.4x7.5x0.2 0.1x0.1x0.1 N/Rice Measurements L x W x D (cm) 14.137 0.008 N/Rice Rice (cm) : rea 2.827 0.001 N/Rice Volume (cm) : 65.40% 100.00% N/Rice % Reduction in Area: 65.40% 100.00% N/Rice % Reduction in Volume: Full Thickness Without Exposed Full Thickness Without Exposed N/Rice Classification: Support Structures Support Structures Medium Medium N/Rice Exudate Rice mount: Serosanguineous Serosanguineous N/Rice Exudate Type: red, brown red, brown N/Rice Exudate Color: Medium (34-66%) Small (1-33%) N/Rice Granulation  Amount: Red Red N/Rice Granulation Quality: Medium (34-66%) Large (67-100%) N/Rice Necrotic Amount: Adherent Slough Eschar, Adherent Slough N/Rice Necrotic Tissue: Fat Layer (Subcutaneous Tissue): Yes Fat Layer (Subcutaneous Tissue): Yes N/Rice Exposed Structures: Fascia: No Fascia: No Tendon: No Tendon: No Muscle: No Muscle: No Joint: No Joint: No Bone: No Bone: No None None N/Rice Epithelialization: Treatment Notes Electronic Signature(s) Signed: 12/12/2022 12:00:53 PM By: Jared Rice Entered By: Jared Rice on 12/12/2022 10:26:48 -------------------------------------------------------------------------------- Multi-Disciplinary Care Plan Details Patient Name: Date of Service: Jared Rice MES Rice. 12/12/2022 10:00 [name] Medical Record Number: [id number] Patient Account Number: [id number] Date of Birth/Sex: Treating RN: [birth year] (89 y.o. Jared Rice Primary Care Primus Gritton: Jared Rice Other Clinician: Betha Rice Referring Chequita Mofield: Treating Rainey Rodger/Extender: Larina Bras,  Kennith Gain, Morrie Sheldon Weeks in Treatment: 0 Active Inactive Necrotic Tissue Nursing Diagnoses: TERY, HOEGER ([id number]) [id number].pdf Page 5 of 10 Knowledge deficit related to management of necrotic/devitalized tissue Goals: Patient/caregiver will verbalize understanding of reason and process for debridement of necrotic tissue Date Initiated: 12/06/2022 Target Resolution Date: 01/06/2023 Goal Status: Active Interventions: Assess patient pain level pre-, during and post procedure and prior to discharge Notes: Wound/Skin Impairment Nursing Diagnoses: Knowledge deficit related to ulceration/compromised skin integrity Goals: Patient/caregiver will verbalize understanding of skin care regimen Date Initiated: 12/06/2022 Target Resolution Date: 01/06/2023 Goal Status: Active Ulcer/skin breakdown will have Rice volume reduction of 30% by week 4 Date Initiated:  12/06/2022 Target Resolution Date: 01/06/2023 Goal Status: Active Ulcer/skin breakdown will have Rice volume reduction of 50% by week 8 Date Initiated: 12/06/2022 Target Resolution Date: 02/06/2023 Goal Status: Active Ulcer/skin breakdown will have Rice volume reduction of 80% by week 12 Date Initiated: 12/06/2022 Target Resolution Date: 03/08/2023 Goal Status: Active Ulcer/skin breakdown will heal within 14 weeks Date Initiated: 12/06/2022 Target Resolution Date: 04/08/2023 Goal Status: Active Interventions: Assess patient/caregiver ability to obtain necessary supplies Assess patient/caregiver ability to perform ulcer/skin care regimen upon admission and as needed Assess ulceration(s) every visit Notes: Electronic Signature(s) Signed: 12/12/2022 12:00:53 PM By: Jared Rice Signed: 12/12/2022 4:35:02 PM By: Angelina Pih Entered By: Jared Rice on 12/12/2022 10:52:47 -------------------------------------------------------------------------------- Pain Assessment Details Patient Name: Date of Service: Jared Rice MES Rice. 12/12/2022 10:00 [name] Medical Record Number: [id number] Patient Account Number: [id number] Date of Birth/Sex: Treating RN: Mar 03, 1933 (89 y.o. Jared Rice Primary Care Shayla Heming: Jared Rice Other Clinician: Betha Rice Referring Aviyana Sonntag: Treating Thresia Ramanathan/Extender: Jared Rice in Treatment: 0 Active Problems Location of Pain Severity and Description of Pain Patient Has Paino No Site Locations RYE, DECOSTE Rice ([id number]) [id number] Page 6 of 10 Pain Management and Medication Current Pain Management: Electronic Signature(s) Signed: 12/12/2022 12:00:53 PM By: Jared Rice Signed: 12/12/2022 4:35:02 PM By: Angelina Pih Entered By: Jared Rice on 12/12/2022 10:19:02 -------------------------------------------------------------------------------- Patient/Caregiver Education Details Patient  Name: Date of Service: Jared Rice MES Rice. 7/18/2024andnbsp10:00 [name] Medical Record Number: [id number] Patient Account Number: [id number] Date of Birth/Gender: Treating RN: 22-Oct-1932 (89 y.o. Jared Rice Primary Care Physician: Jared Rice Other Clinician: Betha Rice Referring Physician: Treating Physician/Extender: Jared Rice in Treatment: 0 Education Assessment Education Provided To: Patient Education Topics Provided Wound/Skin Impairment: Handouts: Other: continue wound care as directed Methods: Explain/Verbal Responses: State content correctly Electronic Signature(s) Signed: 12/12/2022 12:00:53 PM By: Jared Rice Entered By: Jared Rice on 12/12/2022 10:53:32 Garrison [place] ([id number]) [id number] Page 7 of 10 -------------------------------------------------------------------------------- Wound Assessment Details Patient Name: Date of Service: Jared Rice MES Rice. 12/12/2022 10:00 [name] Medical Record Number: [id number] Patient Account Number: [id number] Date of Birth/Sex: Treating RN: 1932-06-24 (89 y.o. Jared Rice Primary Care Tranise Forrest: Jared Rice Other Clinician: Betha Rice Referring Peng Thorstenson: Treating Revis Whalin/Extender: Jared Rice in Treatment: 0 Wound Status Wound Number: 2 Primary Etiology: Skin Tear Wound Location: Left Knee Wound Status: Open Wounding Event: Trauma Comorbid Cataracts, Arrhythmia, Coronary Artery Disease, History: Hypertension Date Acquired: 11/17/2022 Weeks Of Treatment: 0 Clustered Wound: No Photos Wound Measurements Length: (cm) 2.4 Width: (cm) 7.5 Depth: (cm) 0.2 Area: (cm) 14.137 Volume: (cm) 2.827 % Reduction in Area: 65.4% % Reduction in Volume: 65.4% Epithelialization: None Wound Description Classification: Full Thickness Without Exposed Support Structures Exudate Amount: Medium Exudate Type:  Serosanguineous Exudate Color: red, brown Foul Odor After  Cleansing: No Slough/Fibrino Yes Wound Bed Granulation Amount: Medium (34-66%) Exposed Structure Granulation Quality: Red Fascia Exposed: No Necrotic Amount: Medium (34-66%) Fat Layer (Subcutaneous Tissue) Exposed: Yes Necrotic Quality: Adherent Slough Tendon Exposed: No Muscle Exposed: No Joint Exposed: No Bone Exposed: No Treatment Notes Wound #2 (Knee) Wound Laterality: Left Cleanser Soap and Water Discharge Instruction: Gently cleanse wound with antibacterial soap, rinse and pat dry prior to dressing wounds Wound Cleanser MOSTYN, VARNELL Rice ([id number]) [id number] Page 8 of 10 Discharge Instruction: Wash your hands with soap and water. Remove old dressing, discard into plastic bag and place into trash. Cleanse the wound with Wound Cleanser prior to applying Rice clean dressing using gauze sponges, not tissues or cotton balls. Do not scrub or use excessive force. Pat dry using gauze sponges, not tissue or cotton balls. Peri-Wound Care Topical Primary Dressing Xeroform 5x9-HBD (in/in) Discharge Instruction: Apply Xeroform 5x9-HBD (in/in) as directed Secondary Dressing ABD Pad 5x9 (in/in) Discharge Instruction: Cover with ABD pad Kerlix 4.5 x 4.1 (in/yd) Discharge Instruction: Apply Kerlix 4.5 x 4.1 (in/yd) as instructed Secured With Medipore T - 72M Medipore H Soft Cloth Surgical T ape ape, 2x2 (in/yd) Compression Wrap Compression Stockings Add-Ons Electronic Signature(s) Signed: 12/12/2022 12:00:53 PM By: Jared Rice Signed: 12/12/2022 4:35:02 PM By: Angelina Pih Entered By: Jared Rice on 12/12/2022 10:26:10 -------------------------------------------------------------------------------- Wound Assessment Details Patient Name: Date of Service: Jared Rice MES Rice. 12/12/2022 10:00 [name] Medical Record Number: [id number] Patient Account Number: [id number] Date of Birth/Sex: Treating  RN: 1932-10-03 (89 y.o. Jared Rice Primary Care Lucia Mccreadie: Jared Rice Other Clinician: Betha Rice Referring Ashante Yellin: Treating Skyelar Swigart/Extender: Jared Rice in Treatment: 0 Wound Status Wound Number: 3 Primary Etiology: Skin Tear Wound Location: Left Elbow Wound Status: Healed - Epithelialized Wounding Event: Trauma Comorbid Cataracts, Arrhythmia, Coronary Artery Disease, History: Hypertension Date Acquired: 11/17/2022 Weeks Of Treatment: 0 Clustered Wound: No Photos GRABIEL, SCHMUTZ Rice ([id number]) [id number] Page 9 of 10 Wound Measurements Length: (cm) Width: (cm) Depth: (cm) Area: (cm) Volume: (cm) 0 % Reduction in Area: 100% 0 % Reduction in Volume: 100% 0 Epithelialization: Large (67-100%) 0 0 Wound Description Classification: Full Thickness Without Exposed Support Exudate Amount: None Present Structures Foul Odor After Cleansing: No Slough/Fibrino No Wound Bed Granulation Amount: None Present (0%) Exposed Structure Necrotic Amount: None Present (0%) Fascia Exposed: No Fat Layer (Subcutaneous Tissue) Exposed: Yes Tendon Exposed: No Muscle Exposed: No Joint Exposed: No Bone Exposed: No Treatment Notes Wound #3 (Elbow) Wound Laterality: Left Cleanser Peri-Wound Care Topical Primary Dressing Secondary Dressing Secured With Compression Wrap Compression Stockings Add-Ons Electronic Signature(s) Signed: 12/12/2022 12:00:53 PM By: Jared Rice Signed: 12/12/2022 4:35:02 PM By: Angelina Pih Entered By: Jared Rice on 12/12/2022 10:48:32 -------------------------------------------------------------------------------- Vitals Details Patient Name: Date of Service: Jared Rice MES Rice. 12/12/2022 10:00 [name] Medical Record Number: [id number] Patient Account Number: [id number] Date of Birth/Sex: Treating RN: [birth year] (89 y.o. Jared Rice Primary Care Adriaan Maltese: Jared Rice Other Clinician: Betha Rice Referring Khaza Blansett: Treating Dmarion Perfect/Extender: Jared Rice in Treatment: 0 Vital Signs Time Taken: 10:16 Temperature (F): 97.4 Height (in): 73 Pulse (bpm): 79 Weight (lbs): 240 Respiratory Rate (breaths/min): 18 Body Mass Index (BMI): 31.7 Blood Pressure (mmHg): 190/99 NALU, TROUBLEFIELD Rice ([id number]) [id number] Page 10 of 10 Reference Range: 80 - 120 mg / dl Electronic Signature(s) Signed: 12/12/2022 12:00:53 PM By: Jared Rice Entered By: Jared Rice on 12/12/2022 10:18:57

## 2022-12-12 NOTE — Progress Notes (Signed)
RAFE, MACKOWSKI ([id number]) [id number] Page 1 of 10 Visit Report for 12/06/2022 Allergy List Details Patient Name: Date of Service: Jared Rice MES A. 12/06/2022 11:30 A M Medical Record Number: [id number] Patient Account Number: [id number] Date of Birth/Sex: Treating RN: 1932-10-29 (89 y.o. [name]) Yevonne Pax Primary Care Lexia Vandevender: Ronnald Ramp Other Clinician: Referring Koda Routon: Treating Oona Trammel/Extender: Allen Derry Simmons-Robinson, Makiera Weeks in Treatment: 0 Allergies Active Allergies Dexilant primidone Allergy Notes Electronic Signature(s) Signed: 12/12/2022 3:42:23 PM By: Yevonne Pax RN Entered By: Yevonne Pax on 12/06/2022 12:01:45 -------------------------------------------------------------------------------- Arrival Information Details Patient Name: Date of Service: Jared Rice MES A. 12/06/2022 11:30 A M Medical Record Number: [id number] Patient Account Number: [id number] Date of Birth/Sex: Treating RN: Jul 16, 1932 (89 y.o. [name]) Yevonne Pax Primary Care Hommer Cunliffe: Ronnald Ramp Other Clinician: Referring Vernona Peake: Treating Nerine Pulse/Extender: Ranee Gosselin in Treatment: 0 Visit Information Patient Arrived: [name] Arrival Time: 11:54 Accompanied By: wife Transfer Assistance: None Patient Identification Verified: Yes Secondary Verification Process Completed: Yes Patient Requires Transmission-Based Precautions: No Patient Has Alerts: No LUISANGEL, WAINRIGHT ([id number]) History Since Last Visit Added or deleted any medications: No Any new allergies or adverse reactions: No Had a fall or experienced change in activities of daily living that may affect risk of falls: No Signs or symptoms of abuse/neglect since last visito No Hospitalized since last visit: No Implantable device outside of the clinic excluding cellular tissue based products placed in the center since last visit: No Has  Dressing in Place as Prescribed: Yes Pain Present Now: No Electronic Signature(s) Signed: 12/12/2022 3:42:23 PM By: Yevonne Pax RN Entered By: Yevonne Pax on 12/06/2022 12:00:07 -------------------------------------------------------------------------------- Clinic Level of Care Assessment Details Patient Name: Date of Service: Jared Rice MES A. 12/06/2022 11:30 A M Medical Record Number: [id number] Patient Account Number: [id number] Date of Birth/Sex: Treating RN: 08-17-1932 (89 y.o. [name]) Yevonne Pax Primary Care Mithran Strike: Ronnald Ramp Other Clinician: Referring Daci Stubbe: Treating Jodine Muchmore/Extender: Allen Derry Simmons-Robinson, Makiera Weeks in Treatment: 0 Clinic Level of Care Assessment Items TOOL 2 Quantity Score X- 1 0 Use when only an EandM is performed on the INITIAL visit ASSESSMENTS - Nursing Assessment / Reassessment X- 1 20 General Physical Exam (combine w/ comprehensive assessment (listed just below) when performed on new pt. evals) X- 1 25 Comprehensive Assessment (HX, ROS, Risk Assessments, Wounds Hx, etc.) ASSESSMENTS - Wound and Skin A ssessment / Reassessment []  - 0 Simple Wound Assessment / Reassessment - one wound X- 2 5 Complex Wound Assessment / Reassessment - multiple wounds []  - 0 Dermatologic / Skin Assessment (not related to wound area) ASSESSMENTS - Ostomy and/or Continence Assessment and Care []  - 0 Incontinence Assessment and Management []  - 0 Ostomy Care Assessment and Management (repouching, etc.) PROCESS - Coordination of Care X - Simple Patient / Family Education for ongoing care 1 15 []  - 0 Complex (extensive) Patient / Family Education for ongoing care []  - 0 Staff obtains Chiropractor, Records, T Results / Process Orders est []  - 0 Staff telephones HHA, Nursing Homes / Clarify orders / etc []  - 0 Routine Transfer to another Facility (non-emergent condition) []  - 0 Routine Hospital Admission (non-emergent condition) X- 1  15 New Admissions / Manufacturing engineer / Ordering NPWT Apligraf, etc. , []  - 0 Emergency Hospital Admission (emergent condition) X- 1 10 Simple Discharge Coordination []  - 0 Complex (extensive) Discharge Coordination PROCESS - Special Needs []  - 0 Pediatric / Minor Patient Management []  - 0 Isolation Patient Management []  - 0 Hearing /  Language / Visual special needs []  - 0 Assessment of Community assistance (transportation, D/C planning, etc.) MYLEN, MANGAN A ([id number]) [id number] Page 3 of 10 []  - 0 Additional assistance / Altered mentation []  - 0 Support Surface(s) Assessment (bed, cushion, seat, etc.) INTERVENTIONS - Wound Cleansing / Measurement X- 1 5 Wound Imaging (photographs - any number of wounds) []  - 0 Wound Tracing (instead of photographs) []  - 0 Simple Wound Measurement - one wound X- 2 5 Complex Wound Measurement - multiple wounds []  - 0 Simple Wound Cleansing - one wound X- 2 5 Complex Wound Cleansing - multiple wounds INTERVENTIONS - Wound Dressings X - Small Wound Dressing one or multiple wounds 2 10 []  - 0 Medium Wound Dressing one or multiple wounds []  - 0 Large Wound Dressing one or multiple wounds []  - 0 Application of Medications - injection INTERVENTIONS - Miscellaneous []  - 0 External ear exam []  - 0 Specimen Collection (cultures, biopsies, blood, body fluids, etc.) []  - 0 Specimen(s) / Culture(s) sent or taken to Lab for analysis []  - 0 Patient Transfer (multiple staff / Nurse, adult / Similar devices) []  - 0 Simple Staple / Suture removal (25 or less) []  - 0 Complex Staple / Suture removal (26 or more) []  - 0 Hypo / Hyperglycemic Management (close monitor of Blood Glucose) []  - 0 Ankle / Brachial Index (ABI) - do not check if billed separately Has the patient been seen at the hospital within the last three years: Yes Total Score: 140 Level Of Care: New/Established - Level 4 Electronic  Signature(s) Signed: 12/12/2022 3:42:23 PM By: Yevonne Pax RN Entered By: Yevonne Pax on 12/06/2022 12:37:38 -------------------------------------------------------------------------------- Encounter Discharge Information Details Patient Name: Date of Service: Jared Rice MES A. 12/06/2022 11:30 A M Medical Record Number: [id number] Patient Account Number: [id number] Date of Birth/Sex: Treating RN: [birth year] (89 y.o. [name]) Yevonne Pax Primary Care Tashawn Greff: Ronnald Ramp Other Clinician: Referring Mykah Shin: Treating Idona Stach/Extender: Allen Derry Simmons-Robinson, Makiera Weeks in Treatment: 0 Encounter Discharge Information Items Post Procedure Vitals Discharge Condition: Stable Temperature (F): 98.2 Ambulatory Status: Ambulatory Pulse (bpm): 86 Discharge Destination: Home Respiratory Rate (breaths/min): 18 Transportation: Private Auto Blood Pressure (mmHg): 136/78 TYCHO, CHERAMIE A ([id number]) [id number] Page 4 of 10 Accompanied By: self Schedule Follow-up Appointment: Yes Clinical Summary of Care: Electronic Signature(s) Signed: 12/06/2022 12:55:34 PM By: Yevonne Pax RN Entered By: Yevonne Pax on 12/06/2022 12:55:34 -------------------------------------------------------------------------------- Lower Extremity Assessment Details Patient Name: Date of Service: Jared Rice MES A. 12/06/2022 11:30 A M Medical Record Number: [id number] Patient Account Number: [id number] Date of Birth/Sex: Treating RN: 1932/08/18 (89 y.o. [name]) Yevonne Pax Primary Care Evanell Redlich: Ronnald Ramp Other Clinician: Referring Leslye Puccini: Treating Shabnam Ladd/Extender: Audley Hose, Makiera Weeks in Treatment: 0 Electronic Signature(s) Signed: 12/12/2022 3:42:23 PM By: Yevonne Pax RN Entered By: Yevonne Pax on 12/06/2022 12:16:20 -------------------------------------------------------------------------------- Multi Wound Chart  Details Patient Name: Date of Service: Jared Rice MES A. 12/06/2022 11:30 A M Medical Record Number: [id number] Patient Account Number: [id number] Date of Birth/Sex: Treating RN: Jun 05, 1932 (89 y.o. [name]) Yevonne Pax Primary Care Judie Hollick: Ronnald Ramp Other Clinician: Referring Dequincy Born: Treating Kathi Dohn/Extender: Allen Derry Simmons-Robinson, Makiera Weeks in Treatment: 0 Vital Signs Height(in): 73 Pulse(bpm): 86 Weight(lbs): 240 Blood Pressure(mmHg): 136/63 Body Mass Index(BMI): 31.7 Temperature(F): 98.2 Respiratory Rate(breaths/min): 18 [2:Photos:] [N/A:N/A [id number] Page 5 of 10] Left Knee Left Elbow N/A Wound Location: Trauma Trauma N/A Wounding Event: Skin T ear Skin T ear N/A Primary Etiology: Cataracts, Arrhythmia, Coronary Cataracts, Arrhythmia, Coronary N/A Comorbid History:  Artery Disease, Hypertension Artery Disease, Hypertension 11/17/2022 11/17/2022 N/A Date Acquired: 0 0 N/A Weeks of Treatment: Open Open N/A Wound Status: No No N/A Wound Recurrence: 6.5x8x0.2 12x6x0.1 N/A Measurements L x W x D (cm) 40.841 56.549 N/A A (cm) : rea 8.168 5.655 N/A Volume (cm) : Full Thickness Without Exposed Full Thickness Without Exposed N/A Classification: Support Structures Support Structures Medium Medium N/A Exudate A mount: Serosanguineous Serosanguineous N/A Exudate Type: red, brown red, brown N/A Exudate Color: Medium (34-66%) Small (1-33%) N/A Granulation Amount: Red Red N/A Granulation Quality: Medium (34-66%) Large (67-100%) N/A Necrotic Amount: Adherent Slough Eschar, Adherent Slough N/A Necrotic Tissue: Fat Layer (Subcutaneous Tissue): Yes Fat Layer (Subcutaneous Tissue): Yes N/A Exposed Structures: Fascia: No Fascia: No Tendon: No Tendon: No Muscle: No Muscle: No Joint: No Joint: No Bone: No Bone: No None None N/A Epithelialization: Treatment Notes Electronic Signature(s) Signed: 12/12/2022  3:42:23 PM By: Yevonne Pax RN Entered By: Yevonne Pax on 12/06/2022 12:19:20 -------------------------------------------------------------------------------- Multi-Disciplinary Care Plan Details Patient Name: Date of Service: Jared Rice MES A. 12/06/2022 11:30 A M Medical Record Number: [id number] Patient Account Number: [id number] Date of Birth/Sex: Treating RN: Mar 15, 1933 (89 y.o. [name]) Yevonne Pax Primary Care Ameah Chanda: Ronnald Ramp Other Clinician: Referring Inette Doubrava: Treating Jaxston Chohan/Extender: Allen Derry Simmons-Robinson, Makiera Weeks in Treatment: 0 Active Inactive Necrotic Tissue Nursing Diagnoses: Knowledge deficit related to management of necrotic/devitalized tissue Goals: Patient/caregiver will verbalize understanding of reason and process for debridement of necrotic tissue Date Initiated: 12/06/2022 Target Resolution Date: 01/06/2023 Goal Status: Active Interventions: Assess patient pain level pre-, during and post procedure and prior to discharge Notes: LANKFORD, GUTZMER ([id number]) [id number] Page 6 of 10 Wound/Skin Impairment Nursing Diagnoses: Knowledge deficit related to ulceration/compromised skin integrity Goals: Patient/caregiver will verbalize understanding of skin care regimen Date Initiated: 12/06/2022 Target Resolution Date: 01/06/2023 Goal Status: Active Ulcer/skin breakdown will have a volume reduction of 30% by week 4 Date Initiated: 12/06/2022 Target Resolution Date: 01/06/2023 Goal Status: Active Ulcer/skin breakdown will have a volume reduction of 50% by week 8 Date Initiated: 12/06/2022 Target Resolution Date: 02/06/2023 Goal Status: Active Ulcer/skin breakdown will have a volume reduction of 80% by week 12 Date Initiated: 12/06/2022 Target Resolution Date: 03/08/2023 Goal Status: Active Ulcer/skin breakdown will heal within 14 weeks Date Initiated: 12/06/2022 Target Resolution Date: 04/08/2023 Goal Status:  Active Interventions: Assess patient/caregiver ability to obtain necessary supplies Assess patient/caregiver ability to perform ulcer/skin care regimen upon admission and as needed Assess ulceration(s) every visit Notes: Electronic Signature(s) Signed: 12/12/2022 3:42:23 PM By: Yevonne Pax RN Entered By: Yevonne Pax on 12/06/2022 12:18:44 -------------------------------------------------------------------------------- Pain Assessment Details Patient Name: Date of Service: Jared Rice MES A. 12/06/2022 11:30 A M Medical Record Number: [id number] Patient Account Number: [id number] Date of Birth/Sex: Treating RN: Nov 24, 1932 (89 y.o. Melonie [place] Primary Care Naylani Bradner: Ronnald Ramp Other Clinician: Referring Curtina Grills: Treating Alden Bensinger/Extender: Allen Derry Simmons-Robinson, Makiera Weeks in Treatment: 0 Active Problems Location of Pain Severity and Description of Pain Patient Has Paino No Site Locations AYSEN, SHIEH A ([id number]) [id number] Page 7 of 10 Pain Management and Medication Current Pain Management: Electronic Signature(s) Signed: 12/12/2022 3:42:23 PM By: Yevonne Pax RN Entered By: Yevonne Pax on 12/06/2022 12:00:15 -------------------------------------------------------------------------------- Patient/Caregiver Education Details Patient Name: Date of Service: Jared Rice MES A. 7/12/2024andnbsp11:30 A M Medical Record Number: [id number] Patient Account Number: [id number] Date of Birth/Gender: Treating RN: 1932/10/13 (89 y.o. [name]) Yevonne Pax Primary Care Physician: Ronnald Ramp Other Clinician: Referring Physician: Treating Physician/Extender: Allen Derry Simmons-Robinson, Makiera Weeks in  Treatment: 0 Education Assessment Education Provided To: Patient Education Topics Provided Welcome [facility]-New Patient Packet: o Handouts: Welcome T [facility] o Methods:  Explain/Verbal Responses: State content correctly Electronic Signature(s) Signed: 12/12/2022 3:42:23 PM By: Yevonne Pax RN Entered By: Yevonne Pax on 12/06/2022 12:19:00 AUGUSTIN, BUN A ([id number]) [id number] Page 8 of 10 -------------------------------------------------------------------------------- Wound Assessment Details Patient Name: Date of Service: Jared Rice MES A. 12/06/2022 11:30 A M Medical Record Number: [id number] Patient Account Number: [id number] Date of Birth/Sex: Treating RN: 05/01/1933 (89 y.o. [name]) Yevonne Pax Primary Care Dezzie Badilla: Ronnald Ramp Other Clinician: Referring Aveya Beal: Treating Jhada Risk/Extender: Allen Derry Simmons-Robinson, Makiera Weeks in Treatment: 0 Wound Status Wound Number: 2 Primary Etiology: Skin Tear Wound Location: Left Knee Wound Status: Open Wounding Event: Trauma Comorbid Cataracts, Arrhythmia, Coronary Artery Disease, History: Hypertension Date Acquired: 11/17/2022 Weeks Of Treatment: 0 Clustered Wound: No Photos Wound Measurements Length: (cm) 6.5 Width: (cm) 8 Depth: (cm) 0.2 Area: (cm) 40.841 Volume: (cm) 8.168 % Reduction in Area: % Reduction in Volume: Epithelialization: None Tunneling: No Undermining: No Wound Description Classification: Full Thickness Without Exposed Suppor Exudate Amount: Medium Exudate Type: Serosanguineous Exudate Color: red, brown t Structures Foul Odor After Cleansing: No Slough/Fibrino Yes Wound Bed Granulation Amount: Medium (34-66%) Exposed Structure Granulation Quality: Red Fascia Exposed: No Necrotic Amount: Medium (34-66%) Fat Layer (Subcutaneous Tissue) Exposed: Yes Necrotic Quality: Adherent Slough Tendon Exposed: No Muscle Exposed: No Joint Exposed: No Bone Exposed: No Electronic Signature(s) Signed: 12/12/2022 3:42:23 PM By: Yevonne Pax RN Entered By: Yevonne Pax on 12/06/2022 12:14:16 Garrison [place] ([id number])  [id number] Page 9 of 10 -------------------------------------------------------------------------------- Wound Assessment Details Patient Name: Date of Service: Jared Rice MES A. 12/06/2022 11:30 A M Medical Record Number: [id number] Patient Account Number: [id number] Date of Birth/Sex: Treating RN: Jul 15, 1932 (89 y.o. [name]) Yevonne Pax Primary Care Annely Sliva: Ronnald Ramp Other Clinician: Referring Zyair Rhein: Treating Halley Shepheard/Extender: Allen Derry Simmons-Robinson, Makiera Weeks in Treatment: 0 Wound Status Wound Number: 3 Primary Etiology: Skin Tear Wound Location: Left Elbow Wound Status: Open Wounding Event: Trauma Comorbid Cataracts, Arrhythmia, Coronary Artery Disease, History: Hypertension Date Acquired: 11/17/2022 Weeks Of Treatment: 0 Clustered Wound: No Photos Wound Measurements Length: (cm) 12 Width: (cm) 6 Depth: (cm) 0.1 Area: (cm) 56.549 Volume: (cm) 5.655 % Reduction in Area: % Reduction in Volume: Epithelialization: None Tunneling: No Undermining: No Wound Description Classification: Full Thickness Without Exposed Suppor Exudate Amount: Medium Exudate Type: Serosanguineous Exudate Color: red, brown t Structures Foul Odor After Cleansing: No Slough/Fibrino Yes Wound Bed Granulation Amount: Small (1-33%) Exposed Structure Granulation Quality: Red Fascia Exposed: No Necrotic Amount: Large (67-100%) Fat Layer (Subcutaneous Tissue) Exposed: Yes Necrotic Quality: Eschar, Adherent Slough Tendon Exposed: No Muscle Exposed: No Joint Exposed: No Bone Exposed: No Electronic Signature(s) Signed: 12/12/2022 3:42:23 PM By: Yevonne Pax RN Entered By: Yevonne Pax on 12/06/2022 12:16:07 Garrison [place] ([id number]) [id number] Page 10 of 10 -------------------------------------------------------------------------------- Vitals Details Patient Name: Date of Service: Jared Rice MES A. 12/06/2022  11:30 A M Medical Record Number: [id number] Patient Account Number: [id number] Date of Birth/Sex: Treating RN: 1932-10-14 (89 y.o. [name]) Yevonne Pax Primary Care Myliyah Rebuck: Ronnald Ramp Other Clinician: Referring Xitlally Mooneyham: Treating Jelina Paulsen/Extender: Allen Derry Simmons-Robinson, Makiera Weeks in Treatment: 0 Vital Signs Time Taken: 12:00 Temperature (F): 98.2 Height (in): 73 Pulse (bpm): 86 Source: Stated Respiratory Rate (breaths/min): 18 Weight (lbs): 240 Blood Pressure (mmHg): 136/63 Source: Stated Reference Range: 80 - 120 mg / dl Body Mass Index (BMI): 31.7 Electronic Signature(s) Signed:  12/12/2022 3:42:23 PM By: Yevonne Pax RN Entered By: Yevonne Pax on 12/06/2022 12:01:13
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
## 2022-12-12 NOTE — Progress Notes (Signed)
CAPONE, SCHWINN ([id number]) [phone] [id number] Page 1 of 5 Visit Report for 12/06/2022 Abuse Risk Screen Details Patient Name: Date of Service: Jared Rice MES A. 12/06/2022 11:30 A M Medical Record Number: [id number] Patient Account Number: [id number] Date of Birth/Sex: Treating RN: [birth year] (89 y.o. Jared Rice) Yevonne Pax Primary Care Jazmine Longshore: Ronnald Ramp Other Clinician: Referring Sakina Briones: Treating Lynzee Lindquist/Extender: Allen Derry Simmons-Robinson, Makiera Weeks in Treatment: 0 Abuse Risk Screen Items Answer ABUSE RISK SCREEN: Has anyone close to you tried to hurt or harm you recentlyo No Do you feel uncomfortable with anyone in your familyo No Has anyone forced you do things that you didnt want to doo No Electronic Signature(s) Signed: 12/12/2022 3:42:23 PM By: Yevonne Pax RN Entered By: Yevonne Pax on 12/06/2022 12:03:45 -------------------------------------------------------------------------------- Activities of Daily Living Details Patient Name: Date of Service: Jared Rice MES A. 12/06/2022 11:30 A M Medical Record Number: [id number] Patient Account Number: [id number] Date of Birth/Sex: Treating RN: 06-06-1932 (89 y.o. Jared Rice) Yevonne Pax Primary Care Avyukt Cimo: Ronnald Ramp Other Clinician: Referring Menno Vanbergen: Treating Dyanara Cozza/Extender: Allen Derry Simmons-Robinson, Makiera Weeks in Treatment: 0 Activities of Daily Living Items Answer Activities of Daily Living (Please select one for each item) Drive Automobile Completely Able T Medications ake Completely Able Use T elephone Completely Able Care for Appearance Completely Able Use T oilet Completely Able Bath / Shower Completely Able Dress Self Completely Able Feed Self Completely Able Walk Completely Able Get In / Out Bed Completely Able Housework Completely Able Jared Rice, Jared Rice A ([id number]) [phone] [id number] Page 2 of 5 Prepare Meals  Completely Able Handle Money Completely Able Shop for Self Completely Able Electronic Signature(s) Signed: 12/12/2022 3:42:23 PM By: Yevonne Pax RN Entered By: Yevonne Pax on 12/06/2022 12:04:10 -------------------------------------------------------------------------------- Education Screening Details Patient Name: Date of Service: Jared Rice MES A. 12/06/2022 11:30 A M Medical Record Number: [id number] Patient Account Number: [id number] Date of Birth/Sex: Treating RN: 1932/12/06 (89 y.o. Jared Rice) Yevonne Pax Primary Care Meria Crilly: Ronnald Ramp Other Clinician: Referring Jove Beyl: Treating Orlyn Odonoghue/Extender: Ranee Gosselin in Treatment: 0 Primary Learner Assessed: Patient Learning Preferences/Education Level/Primary Language Learning Preference: Explanation Highest Education Level: College or Above Preferred Language: English Cognitive Barrier Language Barrier: No Translator Needed: No Memory Deficit: No Emotional Barrier: No Cultural/Religious Beliefs Affecting Medical Care: No Physical Barrier Impaired Vision: Yes Glasses Impaired Hearing: No Decreased Hand dexterity: No Knowledge/Comprehension Knowledge Level: Medium Comprehension Level: High Ability to understand written instructions: High Ability to understand verbal instructions: High Motivation Anxiety Level: Anxious Cooperation: Cooperative Education Importance: Acknowledges Need Interest in Health Problems: Asks Questions Perception: Coherent Willingness to Engage in Self-Management High Activities: Readiness to Engage in Self-Management High Activities: Electronic Signature(s) Signed: 12/12/2022 3:42:23 PM By: Yevonne Pax RN Entered By: Yevonne Pax on 12/06/2022 12:04:38 Jared Rice, Jared Rice ([id number]) [id number] Page 3 of 5 -------------------------------------------------------------------------------- Fall Risk Assessment  Details Patient Name: Date of Service: Jared Rice MES A. 12/06/2022 11:30 A M Medical Record Number: [id number] Patient Account Number: [id number] Date of Birth/Sex: Treating RN: May 08, 1933 (89 y.o. Jared Rice) Yevonne Pax Primary Care Erykah Lippert: Ronnald Ramp Other Clinician: Referring Dow Blahnik: Treating Aveer Bartow/Extender: Allen Derry Simmons-Robinson, Makiera Weeks in Treatment: 0 Fall Risk Assessment Items Have you had 2 or more falls in the last 12 monthso 0 Yes Have you had any fall that resulted in injury in the last 12 monthso 0 Yes FALLS RISK SCREEN History of falling - immediate or within 3 months 25 Yes Secondary diagnosis (Do you have 2 or more medical diagnoseso) 0  No Ambulatory aid None/bed rest/wheelchair/nurse 0 Yes Crutches/cane/walker 0 No Furniture 0 No Intravenous therapy Access/Saline/Heparin Lock 0 No Gait/Transferring Normal/ bed rest/ wheelchair 0 Yes Weak (short steps with or without shuffle, stooped but able to lift head while walking, may seek 0 No support from furniture) Impaired (short steps with shuffle, may have difficulty arising from chair, head down, impaired 0 No balance) Mental Status Oriented to own ability 0 Yes Electronic Signature(s) Signed: 12/12/2022 3:42:23 PM By: Yevonne Pax RN Entered By: Yevonne Pax on 12/06/2022 12:04:57 -------------------------------------------------------------------------------- Foot Assessment Details Patient Name: Date of Service: Jared Rice MES A. 12/06/2022 11:30 A M Medical Record Number: [id number] Patient Account Number: [id number] Date of Birth/Sex: Treating RN: Oct 06, 1932 (89 y.o. Jared Rice) Yevonne Pax Primary Care Aalina Brege: Ronnald Ramp Other Clinician: Referring Shuna Tabor: Treating Achilles Neville/Extender: Allen Derry Simmons-Robinson, Makiera Weeks in Treatment: 0 Foot Assessment Items Site Locations Jared Rice, Jared Rice A ([id number]) [phone] [id number] Page 4 of 5 +  = Sensation present, - = Sensation absent, C = Callus, U = Ulcer R = Redness, W = Warmth, M = Maceration, PU = Pre-ulcerative lesion F = Fissure, S = Swelling, D = Dryness Assessment Right: Left: Other Deformity: No No Prior Foot Ulcer: No No Prior Amputation: No No Charcot Joint: No No Ambulatory Status: Ambulatory Without Help Gait: Steady Electronic Signature(s) Signed: 12/12/2022 3:42:23 PM By: Yevonne Pax RN Entered By: Yevonne Pax on 12/06/2022 12:05:20 -------------------------------------------------------------------------------- Nutrition Risk Screening Details Patient Name: Date of Service: Jared Rice MES A. 12/06/2022 11:30 A M Medical Record Number: [id number] Patient Account Number: [id number] Date of Birth/Sex: Treating RN: 1932/09/19 (89 y.o. Jared Rice) Yevonne Pax Primary Care Nyomie Ehrlich: Ronnald Ramp Other Clinician: Referring Taariq Leitz: Treating Laurene Melendrez/Extender: Allen Derry Simmons-Robinson, Makiera Weeks in Treatment: 0 Height (in): 73 Weight (lbs): 240 Body Mass Index (BMI): 31.7 Nutrition Risk Screening Items Score Screening NUTRITION RISK SCREEN: I have an illness or condition that made me change the kind and/or amount of food I eat 0 No I eat fewer than two meals per day 0 No I eat few fruits and vegetables, or milk products 0 No I have three or more drinks of beer, liquor or wine almost every day 0 No I have tooth or mouth problems that make it hard for me to eat 0 No I don't always have enough money to buy the food I need 0 No Jared Rice, Jared Rice A ([id number]) [id number] Page 5 of 5 I eat alone most of the time 0 No I take three or more different prescribed or over-the-counter drugs a day 1 Yes Without wanting to, I have lost or gained 10 pounds in the last six months 0 No I am not always physically able to shop, cook and/or feed myself 0 No Nutrition Protocols Good Risk Protocol 0 No interventions  needed Moderate Risk Protocol High Risk Proctocol Risk Level: Good Risk Score: 1 Electronic Signature(s) Signed: 12/12/2022 3:42:23 PM By: Yevonne Pax RN Entered By: Yevonne Pax on 12/06/2022 12:05:10

## 2022-12-13 ENCOUNTER — Telehealth: Payer: Self-pay | Admitting: Cardiology

## 2022-12-13 ENCOUNTER — Ambulatory Visit: Payer: PPO | Admitting: Physician Assistant

## 2022-12-13 ENCOUNTER — Other Ambulatory Visit: Payer: Self-pay

## 2022-12-13 ENCOUNTER — Telehealth: Payer: Self-pay | Admitting: Family Medicine

## 2022-12-13 MED ORDER — APIXABAN 5 MG PO TABS: 5.0000 mg | ORAL_TABLET | Freq: Two times a day (BID) | ORAL | 1 refills | Status: DC

## 2022-12-13 NOTE — Telephone Encounter (Signed)
*  STAT* If patient is at the pharmacy, call can be transferred to refill team.   1. Which medications need to be refilled? (please list name of each medication and dose if known)   apixaban (ELIQUIS) 5 MG TABS tablet   2. Would you like to learn more about the convenience, safety, & potential cost savings by using the [facility]?    3. Are you open to using the [facility] (Type [facility]. ).   4. Which pharmacy/location (including street and city if local pharmacy) is medication to be sent to?  [facility] [employer], [name] - [address]   5. Do they need a 30 day or 90 day supply? 90 day  Patient stated he is completely out of this medication.  Patient stated he also wants to get a few samples of this medication to last him until his prescription comes in.

## 2022-12-13 NOTE — Telephone Encounter (Signed)
Pt stated his cardiologist is filling his medication apixaban (ELIQUIS) 5 MG TABS tablet. However, he stated that he is completely out of this medication. Patient asked if the office had a few samples of this medication to last him until his prescription is mailed through mail order.  Please advise.

## 2022-12-13 NOTE — Telephone Encounter (Signed)
Prescription refill request for Eliquis received. Indication:afib Last office visit:1/24 Scr:1.17  1/24 Age: 89 Weight:115.2  kg  Prescription refill

## 2022-12-16 ENCOUNTER — Other Ambulatory Visit: Payer: Self-pay

## 2022-12-16 MED ORDER — APIXABAN 5 MG PO TABS: 5.0000 mg | ORAL_TABLET | Freq: Two times a day (BID) | ORAL | 1 refills | Status: DC

## 2022-12-16 MED ORDER — APIXABAN 5 MG PO TABS: 5.0000 mg | ORAL_TABLET | Freq: Two times a day (BID) | ORAL | 0 refills | Status: DC

## 2022-12-16 NOTE — Telephone Encounter (Signed)
SEE OTHER ENCOUNTER.--Informed patient . Medication was e-sent to the  mail order pharmacy .  Patient aware samples are available for  pick up

## 2022-12-16 NOTE — Telephone Encounter (Signed)
Informed patient . Medication was e-sent to the  mail order pharmacy .  Patient aware samples are available for  pick up

## 2022-12-17 ENCOUNTER — Other Ambulatory Visit: Payer: Self-pay | Admitting: Family Medicine

## 2022-12-17 ENCOUNTER — Ambulatory Visit: Payer: Self-pay

## 2022-12-17 ENCOUNTER — Encounter: Payer: Self-pay | Admitting: Urology

## 2022-12-17 NOTE — Telephone Encounter (Signed)
Medication Refill - Medication:  furosemide (LASIX) 40 MG tablet  Pt states that he is out of medication and wanting to see if it can be called in for him today or tomorrow.    Has the patient contacted their pharmacy? No.   Preferred Pharmacy (with phone number or street name): [id number] [place] [address] Phone: [phone] Fax: [phone]  Has the patient been seen for an appointment in the last year OR does the patient have an upcoming appointment? Yes.    Agent: Please be advised that RX refills may take up to 3 business days. We ask that you follow-up with your pharmacy.

## 2022-12-17 NOTE — Telephone Encounter (Signed)
Chief Complaint: Medication question about finasteride Symptoms: None Frequency: N/A Pertinent Negatives: Patient denies N/A Disposition: [] ED /[] Urgent Care (no appt availability in office) / [] Appointment(In office/virtual)/ []  [facility]/ [] Home Care/ [] Refused Recommended Disposition /[] [facility]/ [x]  Follow-up with PCP Additional Notes: Patient called and wanted to know if he's supposed to be taking finasteride. Advised it was stopped per the urologist, but then it's noted to take, not on current medication list. Advised to call the urologist office since that's who is managing that medication. He says he will call tomorrow.    Summary: rx concern   The patient would like to discuss their prescription for finasteride (PROSCAR) 5 MG tablet [[id number]]  The patient would like confirmation of it's discontinuation  Please contact when available     Reason for Disposition  [1] Caller has medicine question about med NOT prescribed by PCP AND [2] triager unable to answer question (e.g., compatibility with other med, storage)  Answer Assessment - Initial Assessment Questions 1. NAME of MEDICINE: "What medicine(s) are you calling about?"     Finasteride 2. QUESTION: "What is your question?" (e.g., double dose of medicine, side effect)     Should I still be taking 3. PRESCRIBER: "Who prescribed the medicine?" Reason: if prescribed by specialist, call should be referred to that group.     [place] with [facility] 4. SYMPTOMS: "Do you have any symptoms?" If Yes, ask: "What symptoms are you having?"  "How bad are the symptoms (e.g., mild, moderate, severe)     N/A  Protocols used: Medication Question Call-A-AH

## 2022-12-19 ENCOUNTER — Other Ambulatory Visit: Payer: Self-pay | Admitting: Family Medicine

## 2022-12-19 ENCOUNTER — Ambulatory Visit: Payer: PPO | Admitting: Internal Medicine

## 2022-12-19 MED ORDER — FUROSEMIDE 40 MG PO TABS: ORAL_TABLET | ORAL | 0 refills | Status: DC

## 2022-12-19 NOTE — Telephone Encounter (Signed)
Requested Prescriptions  Pending Prescriptions Disp Refills   furosemide (LASIX) 40 MG tablet 90 tablet 1    Sig: Take 1 tablet by mouth once daily (dose change)     Cardiovascular:  Diuretics - Loop Failed - 12/18/2022  8:59 AM      Failed - K in normal range and within 180 days    Potassium  Date Value Ref Range Status  06/05/2022 4.3 3.5 - 5.2 mmol/L Final  04/27/2014 3.7 3.5 - 5.1 mmol/L Final         Failed - Ca in normal range and within 180 days    Calcium  Date Value Ref Range Status  06/05/2022 9.0 8.6 - 10.2 mg/dL Final   Calcium, Total  Date Value Ref Range Status  10/13/2013 8.6 8.5 - 10.1 mg/dL Final         Failed - Na in normal range and within 180 days    Sodium  Date Value Ref Range Status  06/05/2022 145 (H) 134 - 144 mmol/L Final  10/13/2013 141 136 - 145 mmol/L Final         Failed - Cr in normal range and within 180 days    Creatinine  Date Value Ref Range Status  10/13/2013 0.87 0.60 - 1.30 mg/dL Final   Creatinine, Ser  Date Value Ref Range Status  06/05/2022 1.17 0.76 - 1.27 mg/dL Final         Failed - Cl in normal range and within 180 days    Chloride  Date Value Ref Range Status  06/05/2022 106 96 - 106 mmol/L Final  10/13/2013 105 98 - 107 mmol/L Final         Failed - Mg Level in normal range and within 180 days    No results found for: "MG"       Passed - Last BP in normal range    BP Readings from Last 1 Encounters:  11/25/22 117/76         Passed - Valid encounter within last 6 months    Recent Outpatient Visits           3 weeks ago Skin tear of forearm without complication, left, subsequent encounter   [facility] Malva Limes, MD   2 months ago Skin tear of forearm without complication, left, subsequent encounter   [facility] Merita Norton T, FNP   2 months ago Skin tear of forearm without complication, left, subsequent encounter   [facility] Merita Norton T, FNP   2 months ago Skin tear of forearm without complication, left, subsequent encounter   [facility] Merita Norton T, FNP   3 months ago Skin tear of forearm without complication, left, subsequent encounter   [facility] Ronnald Ramp, MD       Future Appointments             In 2 weeks McGowan, [facility] [place]   In 2 weeks Deirdre Evener, MD [facility]   In 1 month Deirdre Evener, MD [facility]

## 2022-12-23 ENCOUNTER — Ambulatory Visit: Payer: PPO | Admitting: Family Medicine

## 2022-12-24 ENCOUNTER — Ambulatory Visit: Payer: PPO | Admitting: Family Medicine

## 2022-12-25 ENCOUNTER — Emergency Department
Admission: EM | Admit: 2022-12-25 | Discharge: 2022-12-25 | Disposition: A | Discharge status: Home / Self Care | Payer: PPO | Attending: Student in an Organized Health Care Education/Training Program | Admitting: Student in an Organized Health Care Education/Training Program

## 2022-12-25 ENCOUNTER — Encounter: Payer: Self-pay | Admitting: Family Medicine

## 2022-12-25 ENCOUNTER — Other Ambulatory Visit: Payer: Self-pay

## 2022-12-25 DIAGNOSIS — Z1152 Encounter for screening for COVID-19: Secondary | ICD-10-CM | POA: Insufficient documentation

## 2022-12-25 DIAGNOSIS — R251 Tremor, unspecified: Secondary | ICD-10-CM | POA: Insufficient documentation

## 2022-12-25 LAB — CBC
HCT: 32.4 % — ABNORMAL LOW (ref 39.0–52.0)
Hemoglobin: 9.9 g/dL — ABNORMAL LOW (ref 13.0–17.0)
MCH: 30.4 pg (ref 26.0–34.0)
MCHC: 30.6 g/dL (ref 30.0–36.0)
MCV: 99.4 fL (ref 80.0–100.0)
Platelets: 142 10*3/uL — ABNORMAL LOW (ref 150–400)
RBC: 3.26 MIL/uL — ABNORMAL LOW (ref 4.22–5.81)
RDW: 14.2 % (ref 11.5–15.5)
WBC: 7.4 10*3/uL (ref 4.0–10.5)
nRBC: 0 % (ref 0.0–0.2)

## 2022-12-25 LAB — URINALYSIS, ROUTINE W REFLEX MICROSCOPIC
Bacteria, UA: NONE SEEN
Bilirubin Urine: NEGATIVE
Glucose, UA: NEGATIVE mg/dL
Hgb urine dipstick: NEGATIVE
Ketones, ur: NEGATIVE mg/dL
Nitrite: NEGATIVE
Protein, ur: 30 mg/dL — AB
Specific Gravity, Urine: 1.017 (ref 1.005–1.030)
pH: 5 (ref 5.0–8.0)

## 2022-12-25 LAB — BASIC METABOLIC PANEL
Anion gap: 6 (ref 5–15)
BUN: 18 mg/dL (ref 8–23)
CO2: 29 mmol/L (ref 22–32)
Calcium: 7.7 mg/dL — ABNORMAL LOW (ref 8.9–10.3)
Chloride: 100 mmol/L (ref 98–111)
Creatinine, Ser: 1.22 mg/dL (ref 0.61–1.24)
GFR, Estimated: 57 mL/min — ABNORMAL LOW (ref >60)
Glucose, Bld: 123 mg/dL — ABNORMAL HIGH (ref 70–99)
Potassium: 3.3 mmol/L — ABNORMAL LOW (ref 3.5–5.1)
Sodium: 135 mmol/L (ref 135–145)

## 2022-12-25 LAB — SARS CORONAVIRUS 2 BY RT PCR: SARS Coronavirus 2 by RT PCR: NEGATIVE

## 2022-12-25 MED ORDER — CEPHALEXIN 500 MG PO CAPS: 500.0000 mg | ORAL_CAPSULE | Freq: Two times a day (BID) | ORAL | 0 refills | Status: AC

## 2022-12-25 NOTE — Discharge Instructions (Signed)
A prescription for antibiotic has been sent to your pharmacy.  You can check care everywhere tomorrow morning for results of your COVID test.  Please return if you have any worsening symptoms questions or concerns.

## 2022-12-25 NOTE — ED Triage Notes (Signed)
Pt presents to ER with c/o tremors that started this afternoon.  Pt states he is concerned, because last time he had tremors similar to this, he ended up having a UTI.  Pt currently denies any urinary issues.  States he has had one episode of diarrhea today.  Denies any abd pain, back, or chest pain at this time.  Pt is otherwise A&O x4 and in NAD in triage.

## 2022-12-25 NOTE — ED Provider Notes (Signed)
[facility] Provider Note    Event Date/Time   First MD Initiated Contact with Patient 12/25/22 2146     (approximate)   History   Tremors   HPI  Jared Rice is a 89 y.o. male who presents to the ER for evaluation of episode of shaking where he felt like he was having cold chills.     Physical Exam   Triage Vital Signs: ED Triage Vitals  Encounter Vitals Group     BP 12/25/22 2047 (!) 160/92     Systolic BP Percentile --      Diastolic BP Percentile --      Pulse Rate 12/25/22 2047 73     Resp 12/25/22 2047 19     Temp 12/25/22 2047 97.7 F (36.5 C)     Temp Source 12/25/22 2047 Oral     SpO2 12/25/22 2047 96 %     Weight 12/25/22 2055 245 lb (111.1 kg)     Height 12/25/22 2055 6\' 2"  (1.88 m)     Head Circumference --      Peak Flow --      Pain Score 12/25/22 2055 0     Pain Loc --      Pain Education --      Exclude from Growth Chart --     Most recent vital signs: Vitals:   12/25/22 2047  BP: (!) 160/92  Pulse: 73  Resp: 19  Temp: 97.7 F (36.5 C)  SpO2: 96%     Constitutional: Alert *** Eyes: Conjunctivae are normal.  Head: Atraumatic. Nose: No congestion/rhinnorhea. Mouth/Throat: Mucous membranes are moist.   Neck: Painless ROM.  Cardiovascular:   Good peripheral circulation. Respiratory: Normal respiratory effort.  No retractions.  Gastrointestinal: Soft and nontender.  Musculoskeletal:  no deformity Neurologic:  MAE spontaneously. No gross focal neurologic deficits are appreciated. *** Skin:  Skin is warm, dry and intact. No rash noted. Psychiatric: Mood and affect are normal. Speech and behavior are normal.    ED Results / Procedures / Treatments   Labs (all labs ordered are listed, but only abnormal results are displayed) Labs Reviewed  CBC - Abnormal; Notable for the following components:      Result Value   RBC 3.26 (*)    Hemoglobin 9.9 (*)    HCT 32.4 (*)    Platelets 142 (*)    All other  components within normal limits  BASIC METABOLIC PANEL - Abnormal; Notable for the following components:   Potassium 3.3 (*)    Glucose, Bld 123 (*)    Calcium 7.7 (*)    GFR, Estimated 57 (*)    All other components within normal limits  URINALYSIS, ROUTINE W REFLEX MICROSCOPIC - Abnormal; Notable for the following components:   Color, Urine YELLOW (*)    APPearance HAZY (*)    Protein, ur 30 (*)    Leukocytes,Ua MODERATE (*)    All other components within normal limits  SARS CORONAVIRUS 2 BY RT PCR     EKG  ***   RADIOLOGY ***   PROCEDURES:  Critical Care performed: {CriticalCareYesNo:19197::"Yes, see critical care procedure note(s)","No"}  Procedures   MEDICATIONS ORDERED IN ED: Medications - No data to display   IMPRESSION / MDM / ASSESSMENT AND PLAN / ED COURSE  I reviewed the triage vital signs and the nursing notes.  Differential diagnosis includes, but is not limited to, ***   Clinical Course as of 12/25/22 2312  Wed Dec 25, 2022  2229 No leukocytosis.  Blood Accrufer at baseline.  Mild cystitis.  No hematuria. [PR]    Clinical Course User Index [PR] Willy Eddy, MD     FINAL CLINICAL IMPRESSION(S) / ED DIAGNOSES   Final diagnoses:  Episode of shaking     Rx / DC Orders   ED Discharge Orders          Ordered    cephALEXin (KEFLEX) 500 MG capsule  2 times daily        12/25/22 2312             Note:  This document was prepared using Dragon voice recognition software and may include unintentional dictation errors.

## 2022-12-25 NOTE — ED Notes (Signed)
ED Provider at bedside. 

## 2022-12-30 ENCOUNTER — Encounter: Payer: Self-pay | Admitting: Family Medicine

## 2022-12-31 ENCOUNTER — Ambulatory Visit: Payer: Self-pay | Admitting: *Deleted

## 2022-12-31 ENCOUNTER — Telehealth: Payer: Self-pay

## 2022-12-31 NOTE — Patient Outreach (Signed)
  Care Coordination   Follow Up Visit Note   01/01/2023 Name: Jared Rice MRN: [id number] DOB: 07-24-1932  Jared Rice is a 89 y.o. year old male who sees Simmons-Robinson, Tawanna Cooler, MD for primary care. I spoke with Jared Rice, daughter of Jared Rice by phone today.  What matters to the patients health and wellness today?  Keep patient compliant with medications, therefore keeping blood pressure controlled     Goals Addressed             This Visit's Progress    Effective management of HTN   On track    Care Coordination Interventions: Evaluation of current treatment plan related to hypertension self management and patient's adherence to plan as established by provider Reviewed medications with patient and discussed importance of compliance Discussed plans with patient for ongoing care management follow up and provided patient with direct contact information for care management team Advised patient, providing education and rationale, to monitor blood pressure daily and record, calling PCP for findings outside established parameters Discussed complications of poorly controlled blood pressure such as heart disease, stroke, circulatory complications, vision complications, kidney impairment, sexual dysfunction          SDOH assessments and interventions completed:  No     Care Coordination Interventions:  Yes, provided   Interventions Today    Flowsheet Row Most Recent Value  Chronic Disease   Chronic disease during today's visit Hypertension (HTN)  General Interventions   General Interventions Discussed/Reviewed General Interventions Reviewed, Doctor Visits, Durable Medical Equipment (DME)  Doctor Visits Discussed/Reviewed Doctor Visits Reviewed, PCP, Specialist  [Daughter will call PCPoffice to schedule appt, will see dermatology on 8/14 and urology on 8/9]  Durable Medical Equipment (DME) BP Cuff  PCP/Specialist Visits Compliance with follow-up visit  Education  Interventions   Education Provided Provided Education  Provided Verbal Education On Medication, When to see the doctor, Other  [Per daughter, it has been difficult to have patient take medications as prescribed, now has family friend that visits daily to make sure he is compliant.  recent concern for memory issues, they will continue to monitor]       Follow up plan: Follow up call scheduled for 9/6    Encounter Outcome:  Pt. Visit Completed   Kemper Durie, RN, MSN, [facility] Care Management Care Management Coordinator [phone]

## 2022-12-31 NOTE — Telephone Encounter (Signed)
Transition Care Management Follow-up Telephone Call Date of discharge and from where: [place] 7/31 How have you been since you were released from the hospital? Doing ok Any questions or concerns? No  Items Reviewed: Did the pt receive and understand the discharge instructions provided? Yes  Medications obtained and verified? Yes  Other? No  Any new allergies since your discharge? No  Dietary orders reviewed? No Do you have support at home? Yes     Follow up appointments reviewed:  PCP Hospital f/u appt confirmed? No  Scheduled to see  on  @ . Specialist Hospital f/u appt confirmed? Yes  Scheduled to see urologist on 8/9 @ . Are transportation arrangements needed? Yes  If their condition worsens, is the pt aware to call PCP or go to the Emergency Dept.? Yes Was the patient provided with contact information for the PCP's office or ED? Yes Was to pt encouraged to call back with questions or concerns? Yes

## 2023-01-03 ENCOUNTER — Ambulatory Visit: Payer: PPO | Admitting: Urology

## 2023-01-08 ENCOUNTER — Encounter: Payer: Self-pay | Admitting: Dermatology

## 2023-01-08 ENCOUNTER — Ambulatory Visit: Payer: PPO | Admitting: Dermatology

## 2023-01-08 DIAGNOSIS — L820 Inflamed seborrheic keratosis: Secondary | ICD-10-CM

## 2023-01-08 DIAGNOSIS — Z85828 Personal history of other malignant neoplasm of skin: Secondary | ICD-10-CM

## 2023-01-08 DIAGNOSIS — W908XXA Exposure to other nonionizing radiation, initial encounter: Secondary | ICD-10-CM | POA: Diagnosis not present

## 2023-01-08 DIAGNOSIS — Z8589 Personal history of malignant neoplasm of other organs and systems: Secondary | ICD-10-CM

## 2023-01-08 DIAGNOSIS — L570 Actinic keratosis: Secondary | ICD-10-CM | POA: Diagnosis not present

## 2023-01-08 DIAGNOSIS — D692 Other nonthrombocytopenic purpura: Secondary | ICD-10-CM | POA: Diagnosis not present

## 2023-01-08 DIAGNOSIS — L578 Other skin changes due to chronic exposure to nonionizing radiation: Secondary | ICD-10-CM

## 2023-01-08 DIAGNOSIS — L814 Other melanin hyperpigmentation: Secondary | ICD-10-CM | POA: Diagnosis not present

## 2023-01-08 NOTE — Patient Instructions (Signed)

## 2023-01-08 NOTE — Progress Notes (Signed)
Follow-Up Visit   Subjective  Jared Rice is a 89 y.o. male who presents for the following: The patient has spots, moles and lesions to be evaluated, some may be new or changing and the patient may have concern these could be cancer.  The patient has spots, moles and lesions to be evaluated, some may be new or changing and the patient may have concern these could be cancer.   The following portions of the chart were reviewed this encounter and updated as appropriate: medications, allergies, medical history  Review of Systems:  No other skin or systemic complaints except as noted in HPI or Assessment and Plan.  Objective  Well appearing patient in no apparent distress; mood and affect are within normal limits.   A focused examination was performed of the following areas: Face, arms/hands, back  Relevant exam findings are noted in the Assessment and Plan.  Face x 9, Right arm x 1 (10) Erythematous thin papules/macules with gritty scale.   Face x 7, hands/arms x 8 (15) Erythematous stuck-on, waxy papule or plaque    Assessment & Plan   ACTINIC DAMAGE - chronic, secondary to cumulative UV radiation exposure/sun exposure over time - diffuse scaly erythematous macules with underlying dyspigmentation - Recommend daily broad spectrum sunscreen SPF 30+ to sun-exposed areas, reapply every 2 hours as needed.  - Recommend staying in the shade or wearing long sleeves, sun glasses (UVA+UVB protection) and wide brim hats (4-inch brim around the entire circumference of the hat). - Call for new or changing lesions.  LENTIGINES Exam: scattered tan macules Due to sun exposure Treatment Plan: Benign-appearing, observe. Recommend daily broad spectrum sunscreen SPF 30+ to sun-exposed areas, reapply every 2 hours as needed.  Call for any changes  HISTORY OF BASAL CELL CARCINOMA OF THE SKIN - No evidence of recurrence today - Recommend regular full body skin exams - Recommend daily broad  spectrum sunscreen SPF 30+ to sun-exposed areas, reapply every 2 hours as needed.  - Call if any new or changing lesions are noted between office visits  HISTORY OF SQUAMOUS CELL CARCINOMA OF THE SKIN - No evidence of recurrence today - No lymphadenopathy - Recommend regular full body skin exams - Recommend daily broad spectrum sunscreen SPF 30+ to sun-exposed areas, reapply every 2 hours as needed.  - Call if any new or changing lesions are noted between office visits   Purpura - Chronic; persistent and recurrent.  Treatable, but not curable. - Violaceous macules and patches - Benign - Related to trauma, age, sun damage and/or use of blood thinners, chronic use of topical and/or oral steroids - Observe - Can use OTC arnica containing moisturizer such as Dermend Bruise Formula if desired - Call for worsening or other concerns     AK (actinic keratosis) (10) Face x 9, Right arm x 1  Destruction of lesion - Face x 9, Right arm x 1 (10) Complexity: simple   Destruction method: cryotherapy   Informed consent: discussed and consent obtained   Timeout:  patient name, date of birth, surgical site, and procedure verified Lesion destroyed using liquid nitrogen: Yes   Region frozen until ice ball extended beyond lesion: Yes   Outcome: patient tolerated procedure well with no complications   Post-procedure details: wound care instructions given    Inflamed seborrheic keratosis (15) Face x 7, hands/arms x 8  Symptomatic, irritating, patient would like treated.  Benign-appearing.  Call clinic for new or changing lesions.    Destruction of  lesion - Face x 7, hands/arms x 8 (15) Complexity: simple   Destruction method: cryotherapy   Informed consent: discussed and consent obtained   Timeout:  patient name, date of birth, surgical site, and procedure verified Lesion destroyed using liquid nitrogen: Yes   Region frozen until ice ball extended beyond lesion: Yes   Outcome: patient  tolerated procedure well with no complications   Post-procedure details: wound care instructions given      Return for 6-8 months , Follow up.  I, Joanie Coddington, CMA, am acting as scribe for Armida Sans, MD .   Documentation: I have reviewed the above documentation for accuracy and completeness, and I agree with the above.  Armida Sans, MD

## 2023-01-09 ENCOUNTER — Ambulatory Visit: Payer: PPO | Admitting: Urology

## 2023-01-09 ENCOUNTER — Encounter: Payer: Self-pay | Admitting: Dermatology

## 2023-01-12 ENCOUNTER — Encounter: Payer: Self-pay | Admitting: Family Medicine

## 2023-01-12 DIAGNOSIS — I10 Essential (primary) hypertension: Secondary | ICD-10-CM

## 2023-01-13 MED ORDER — RAMIPRIL 5 MG PO CAPS
5.0000 mg | ORAL_CAPSULE | Freq: Every day | ORAL | 1 refills | Status: DC
Start: 2023-01-13 — End: 2023-07-17

## 2023-01-15 ENCOUNTER — Telehealth: Payer: Self-pay

## 2023-01-15 NOTE — Progress Notes (Signed)
09/12/2022 2:29 PM    Yona Fuhrmann Bullinger March 01, 1933 [id number]   Referring provider: Ronnald Ramp, MD [address] [place],  [place] [address]   Urological history 1. BPH with LU TS -PSA (06/2022) 3.5  -Doxazosin 8 mg daily    2.  Right hydrocele -Right hydrocelectomy (2015)      Chief Complaint  Patient presents with   Benign Prostatic Hypertrophy      HPI: ARTHER DWINELL is a 89 y.o. male who presents today for a three month follow up with his wife, [name].    Previous records reviewed.   He has no urinary complaints.  Patient denies any modifying or aggravating factors.  Patient denies any recent UTI's, gross hematuria, dysuria or suprapubic/flank pain.  Patient denies any fevers, chills, nausea or vomiting.    I PSS 0/0   PVR 401  mL        IPSS       Row Name 09/12/22 1400                   International Prostate Symptom Score    How often have you had the sensation of not emptying your bladder? Not at All        How often have you had to urinate less than every two hours? Less than 1 in 5 times        How often have you found you stopped and started again several times when you urinated? Less than 1 in 5 times        How often have you found it difficult to postpone urination? Less than half the time        How often have you had a weak urinary stream? Less than 1 in 5 times        How often have you had to strain to start urination? Not at All        How many times did you typically get up at night to urinate? 1 Time        Total IPSS Score 6               Quality of Life due to urinary symptoms    If you were to spend the rest of your life with your urinary condition just the way it is now how would you feel about that? Mostly Satisfied                        Score:  1-7 Mild 8-19 Moderate 20-35 Severe      PMH:     Past Medical History:  Diagnosis Date   Actinic keratosis     Anginal pain     Aortic  atherosclerosis     Arthritis     Atrial fibrillation     Basal cell carcinoma 02/06/2010    Left shoulder. Superficial.   Basal cell carcinoma 10/11/2013    Right medial forearm. Superficial   Basal cell carcinoma 04/10/2015    Right inf. lat. thigh. Superficial   Basal cell carcinoma 12/09/2016    Right cheek. Superficial.   Chronic airway obstruction, not elsewhere classified     Colon polyps     Complication of anesthesia      SPINAL DID NOT WORK FOR LAST KNEE REPLACEMENT AND HAD TO HAVE GA   Coronary atherosclerosis of native coronary artery      nonobstructive   Diabetes mellitus without complication  Essential hypertension, benign     GERD (gastroesophageal reflux disease)      H/O   Glucose intolerance (impaired glucose tolerance)     Heart murmur     History of kidney stones     Hyperlipidemia     Hypothyroidism     Iron deficiency anemia     Knee pain, right     LBBB (left bundle branch block)     Occlusion and stenosis of carotid artery without mention of cerebral infarction      wears compression stockings   Other dyspnea and respiratory abnormality      w/ pseuodowheeze resolves with purse lip manuever   Precordial pain     Shoulder pain, right     Squamous cell carcinoma of skin 10/06/2007    Right forearm. SCCis   Squamous cell carcinoma of skin 10/11/2013    Left mid lat. pretibial. KA-like pattern.   Tremor     Unspecified sleep apnea      HAS NOT USED CPAP IN 20 YEARS   Venous insufficiency     Wears dentures      full upper   Wound cellulitis- right lower leg anterior  10/29/2019          Surgical History:      Past Surgical History:  Procedure Laterality Date   CARDIAC CATHETERIZATION        X2   COLONOSCOPY   03/17/12    Dr Byrnett-diverticulosis   ESOPHAGOGASTRODUODENOSCOPY (EGD) WITH PROPOFOL N/A 11/25/2014    Procedure: ESOPHAGOGASTRODUODENOSCOPY (EGD) WITH PROPOFOL;  Surgeon: Midge Minium, MD;  Location: [facility];   Service: Endoscopy;  Laterality: N/A;  with biopsy   ESOPHAGOGASTRODUODENOSCOPY (EGD) WITH PROPOFOL N/A 01/05/2015    Procedure: ESOPHAGOGASTRODUODENOSCOPY (EGD) WITH PROPOFOL;  Surgeon: Midge Minium, MD;  Location: [facility];  Service: Endoscopy;  Laterality: N/A;   EYE SURGERY Bilateral      cataract    KNEE ARTHROPLASTY Right 09/06/2020    Procedure: COMPUTER ASSISTED TOTAL KNEE ARTHROPLASTY;  Surgeon: Donato Heinz, MD;  Location: [facility];  Service: Orthopedics;  Laterality: Right;   TOTAL KNEE ARTHROPLASTY Left 2012          Home Medications:  Allergies as of 09/12/2022         Reactions    Dexilant [dexlansoprazole] Rash    Primidone Itching, Rash            Medication List           Accurate as of September 12, 2022  2:29 PM. If you have any questions, ask your nurse or doctor.              diphenhydramine-acetaminophen 25-500 MG Tabs tablet Commonly known as: TYLENOL PM Take 2 tablets by mouth at bedtime.    doxazosin 8 MG tablet Commonly known as: CARDURA Take 1 tablet by mouth once daily    Eliquis 5 MG Tabs tablet Generic drug: apixaban Take 1 tablet by mouth twice a day    furosemide 40 MG tablet Commonly known as: LASIX Take 1 tablet by mouth once daily (dose change)    levothyroxine 88 MCG tablet Commonly known as: SYNTHROID TAKE 1 TABLET BY MOUTH ONCE DAILY IN THE MORNING BEFORE BREAKFAST    ramipril 5 MG capsule Commonly known as: ALTACE Take 1 capsule by mouth once daily    rosuvastatin 10 MG tablet Commonly known as: CRESTOR Take 1 tablet by mouth once daily    topiramate 50  MG tablet Commonly known as: TOPAMAX Take 1 tablet by mouth twice daily             Allergies:  Allergies      Allergies  Allergen Reactions   Dexilant [Dexlansoprazole] Rash   Primidone Itching and Rash        Family History:      Family History  Problem Relation Age of Onset   Heart failure Mother 56        congestive   Heart attack  Father 33   Alcohol abuse Father     Alzheimer's disease Brother 7   Alzheimer's disease Sister 68   Colon cancer Neg Hx     Liver disease Neg Hx            Social History:  reports that he quit smoking about 41 years ago. His smoking use included cigarettes. He has a 62.00 pack-year smoking history. He has never used smokeless tobacco. He reports that he does not drink alcohol and does not use drugs.     Physical Exam: Blood pressure 131/70, pulse 75.  Constitutional:  Well nourished. Alert   Trachea midline Cardiovascular: No clubbing, cyanosis, or edema. Respiratory: Normal respiratory effort, no increased work of breathing. Neurologic: Grossly intact, no focal deficits, moving all 4 extremities. Psychiatric: Normal mood and affect.   Laboratory data:      Latest Ref Rng & Units 12/25/2022    8:57 PM 06/05/2022   10:21 AM 04/26/2022    3:45 PM  BMP  Glucose 70 - 99 mg/dL 086  97  99   BUN 8 - 23 mg/dL 18  17  18    Creatinine 0.61 - 1.24 mg/dL 5.78  4.69  6.29   BUN/Creat Ratio 10 - 24  15  14    Sodium 135 - 145 mmol/L 135  145  144   Potassium 3.5 - 5.1 mmol/L 3.3  4.3  4.3   Chloride 98 - 111 mmol/L 100  106  102   CO2 22 - 32 mmol/L 29  27  28    Calcium 8.9 - 10.3 mg/dL 7.7  9.0  9.1     Urinalysis    Component Value Date/Time   COLORURINE YELLOW (A) 12/25/2022 2057   APPEARANCEUR HAZY (A) 12/25/2022 2057   LABSPEC 1.017 12/25/2022 2057   PHURINE 5.0 12/25/2022 2057   GLUCOSEU NEGATIVE 12/25/2022 2057   HGBUR NEGATIVE 12/25/2022 2057   BILIRUBINUR NEGATIVE 12/25/2022 2057   BILIRUBINUR neg 07/01/2022 1427   KETONESUR NEGATIVE 12/25/2022 2057   PROTEINUR 30 (A) 12/25/2022 2057   UROBILINOGEN 4.0 (A) 07/01/2022 1427   NITRITE NEGATIVE 12/25/2022 2057   LEUKOCYTESUR MODERATE (A) 12/25/2022 2057  I have reviewed the labs.     Pertinent imaging:  01/16/23 15:26  Scan Result 401 ml     Assessment & Plan:     1. Benign prostatic hyperplasia with  LUTS -Continue doxazosin and finasteride   2. Incomplete bladder emptying - his serum creatinine is stable -he is not having symptoms of retention  3. Right hydrocele -Large right hydrocele.  He has no bothersome symptoms   Return in about 6 months (around 07/19/2023) for IPSS and PVR.    Cloretta Ned   [facility] [address], [address] [place], [place] [address]

## 2023-01-15 NOTE — Telephone Encounter (Signed)
Call to patient daughter to see if patient received Eliquis as he did not picl up samples.  She states unsure but will find out. Advised her I will call pharmacy . Call to pharmacy and confirmed that it was filled 7/22. There was no need for samples since he received his prescription the same day.  Samples will be placed back into inventory

## 2023-01-16 ENCOUNTER — Ambulatory Visit: Payer: PPO | Admitting: Urology

## 2023-01-16 VITALS — BP 131/70 | HR 75

## 2023-01-16 DIAGNOSIS — R339 Retention of urine, unspecified: Secondary | ICD-10-CM | POA: Diagnosis not present

## 2023-01-16 DIAGNOSIS — R3915 Urgency of urination: Secondary | ICD-10-CM

## 2023-01-16 DIAGNOSIS — N401 Enlarged prostate with lower urinary tract symptoms: Secondary | ICD-10-CM | POA: Diagnosis not present

## 2023-01-16 DIAGNOSIS — N433 Hydrocele, unspecified: Secondary | ICD-10-CM | POA: Diagnosis not present

## 2023-01-16 DIAGNOSIS — R350 Frequency of micturition: Secondary | ICD-10-CM

## 2023-01-16 LAB — BLADDER SCAN AMB NON-IMAGING: Scan Result: 401

## 2023-01-19 ENCOUNTER — Encounter: Payer: Self-pay | Admitting: Urology

## 2023-01-19 ENCOUNTER — Encounter: Payer: Self-pay | Admitting: Dermatology

## 2023-01-22 ENCOUNTER — Ambulatory Visit: Payer: PPO | Admitting: Dermatology

## 2023-01-24 ENCOUNTER — Telehealth: Payer: Self-pay | Admitting: Family Medicine

## 2023-01-24 NOTE — Telephone Encounter (Signed)
[facility] faxed refill request for the following medications:   ramipril (ALTACE) 5 MG capsule    Please advise.

## 2023-01-24 NOTE — Telephone Encounter (Signed)
Patient requested ramipril 5 mg to go to [facility] [address] on 01/13/23.

## 2023-01-31 ENCOUNTER — Ambulatory Visit: Payer: Self-pay | Admitting: *Deleted

## 2023-01-31 NOTE — Patient Outreach (Signed)
  Care Coordination   Follow Up Visit Note   02/02/2023 Name: Jared Rice MRN: [id number] DOB: 10/26/1932  Jared Rice is a 89 y.o. year old male who sees Simmons-Robinson, Tawanna Cooler, MD for primary care. I spoke with  Garrison Columbus and daughter Wilford Sports by phone today.  What matters to the patients health and wellness today?  Keep and attend PCP appointment.  Per daughter, patient has been canceling them, stating he does not need to go.  She will continue to discuss with him the importance of follow up    Goals Addressed             This Visit's Progress    Effective management of HTN   On track    Care Coordination Interventions: Evaluation of current treatment plan related to hypertension self management and patient's adherence to plan as established by provider Reviewed medications with patient and discussed importance of compliance Discussed plans with patient for ongoing care management follow up and provided patient with direct contact information for care management team Advised patient, providing education and rationale, to monitor blood pressure daily and record, calling PCP for findings outside established parameters Discussed complications of poorly controlled blood pressure such as heart disease, stroke, circulatory complications, vision complications, kidney impairment, sexual dysfunction          SDOH assessments and interventions completed:  No     Care Coordination Interventions:  Yes, provided   Interventions Today    Flowsheet Row Most Recent Value  Chronic Disease   Chronic disease during today's visit Chronic Obstructive Pulmonary Disease (COPD), Hypertension (HTN), Atrial Fibrillation (AFib)  General Interventions   General Interventions Discussed/Reviewed General Interventions Reviewed, Doctor Visits, Communication with  Doctor Visits Discussed/Reviewed Doctor Visits Reviewed, PCP  PCP/Specialist Visits Compliance with follow-up visit   Communication with PCP/Specialists  [Call placed to PCP office, appt scheduled for 9/11 at 3pm]  Education Interventions   Education Provided Provided Education  Provided Verbal Education On When to see the doctor, Medication  [Discussed with daughter ways to get patient compliant with medications.  Family friend going to the home daily to remind patient to take meds]  Pharmacy Interventions   Pharmacy Dicussed/Reviewed Pharmacy Topics Reviewed, Medication Adherence       Follow up plan: Follow up call scheduled for 10/2    Encounter Outcome:  Patient Visit Completed   Kemper Durie, RN, MSN, [facility] Care Management Care Management Coordinator [phone]

## 2023-02-03 ENCOUNTER — Encounter: Payer: Self-pay | Admitting: Family Medicine

## 2023-02-05 ENCOUNTER — Ambulatory Visit: Payer: PPO | Admitting: Family Medicine

## 2023-02-06 ENCOUNTER — Ambulatory Visit (INDEPENDENT_AMBULATORY_CARE_PROVIDER_SITE_OTHER): Payer: PPO | Admitting: Family Medicine

## 2023-02-06 ENCOUNTER — Encounter: Payer: Self-pay | Admitting: Family Medicine

## 2023-02-06 VITALS — BP 123/77 | HR 71 | Temp 97.5°F | Ht 74.0 in | Wt 239.0 lb

## 2023-02-06 DIAGNOSIS — Z7901 Long term (current) use of anticoagulants: Secondary | ICD-10-CM

## 2023-02-06 DIAGNOSIS — I4891 Unspecified atrial fibrillation: Secondary | ICD-10-CM | POA: Diagnosis not present

## 2023-02-06 DIAGNOSIS — Z23 Encounter for immunization: Secondary | ICD-10-CM

## 2023-02-06 DIAGNOSIS — I10 Essential (primary) hypertension: Secondary | ICD-10-CM | POA: Diagnosis not present

## 2023-02-06 DIAGNOSIS — E038 Other specified hypothyroidism: Secondary | ICD-10-CM | POA: Diagnosis not present

## 2023-02-06 DIAGNOSIS — G250 Essential tremor: Secondary | ICD-10-CM | POA: Diagnosis not present

## 2023-02-06 DIAGNOSIS — K449 Diaphragmatic hernia without obstruction or gangrene: Secondary | ICD-10-CM | POA: Diagnosis not present

## 2023-02-06 NOTE — Progress Notes (Signed)
Established patient visit   Patient: Jared Rice   DOB: [birth year]   89 y.o. Male  MRN: [id number] Visit Date: 02/06/2023  Today's healthcare provider: Ronnald Ramp, MD   Chief Complaint  Patient presents with   Skin tears and infection, follow up   Subjective       Discussed the use of AI scribe software for clinical note transcription with the patient, who gave verbal consent to proceed.  History of Present Illness   The patient, with a history of atrial fibrillation, hypertension, and tremors, presents for a follow-up visit. He recently experienced a fall in his sunroom due to a rug, resulting in skin tears on his arm and a knee injury. Both injuries are healing well without the need for stitches, and the patient reports no new health issues since the fall.  The patient also reports a resolution of previous issues with loose bowels. He continues to take Eliquis 5 mg twice daily, Lasix 40 mg daily, Altace 5 mg daily, Crestor 10 mg daily, and Topiramate 50 mg daily. The patient is unsure of the purpose of the Topiramate, but it is suspected to be for tremors, which have improved significantly.  The patient's blood pressure is well-controlled, and he reports no issues with breathing or dizziness. He has not seen his cardiologist recently but typically has an annual visit. The patient's potassium levels were slightly low at the end of August, and he has agreed to have his thyroid levels checked today.  The patient lives at home and has made modifications for safety, including a walk-in shower with a built-in seat and handrails. However, there is a rug in the sunroom that has caused a fall, and it is recommended that this be removed to prevent future accidents. The patient received a flu shot earlier in the season.         Past Medical History:  Diagnosis Date   Actinic keratosis    Anginal pain (HCC)    Aortic atherosclerosis (HCC)    Arthritis    Atrial  fibrillation (HCC)    Basal cell carcinoma 02/06/2010   Left shoulder. Superficial.   Basal cell carcinoma 10/11/2013   Right medial forearm. Superficial   Basal cell carcinoma 04/10/2015   Right inf. lat. thigh. Superficial   Basal cell carcinoma 12/09/2016   Right cheek. Superficial.   Chronic airway obstruction, not elsewhere classified    Colon polyps    Complication of anesthesia    SPINAL DID NOT WORK FOR LAST KNEE REPLACEMENT AND HAD TO HAVE GA   Coronary atherosclerosis of native coronary artery    nonobstructive   Diabetes mellitus without complication (HCC)    Essential hypertension, benign    GERD (gastroesophageal reflux disease)    H/O   Glucose intolerance (impaired glucose tolerance)    Heart murmur    History of kidney stones    Hyperlipidemia    Hypothyroidism    Iron deficiency anemia    Knee pain, right    LBBB (left bundle branch block)    Occlusion and stenosis of carotid artery without mention of cerebral infarction    wears compression stockings   Other dyspnea and respiratory abnormality    w/ pseuodowheeze resolves with purse lip manuever   Precordial pain    Shoulder pain, right    Squamous cell carcinoma of skin 10/06/2007   Right forearm. SCCis   Squamous cell carcinoma of skin 10/11/2013   Left mid lat. pretibial. KA-like  pattern.   Tremor    Unspecified sleep apnea    HAS NOT USED CPAP IN 20 YEARS   Venous insufficiency    Wears dentures    full upper   Wound cellulitis- right lower leg anterior  10/29/2019    Medications: Outpatient Medications Prior to Visit  Medication Sig   apixaban (ELIQUIS) 5 MG TABS tablet Take 1 tablet (5 mg total) by mouth 2 (two) times daily.   apixaban (ELIQUIS) 5 MG TABS tablet Take 1 tablet (5 mg total) by mouth 2 (two) times daily.   cyanocobalamin (VITAMIN B12) 1000 MCG tablet Take 1 tablet (1,000 mcg total) by mouth daily.   diphenhydramine-acetaminophen (TYLENOL PM) 25-500 MG TABS Take 2 tablets by  mouth at bedtime.   doxazosin (CARDURA) 8 MG tablet Take 1 tablet by mouth once daily   furosemide (LASIX) 40 MG tablet Take 1 tablet by mouth once daily (dose change)   levothyroxine (SYNTHROID) 88 MCG tablet TAKE 1 TABLET BY MOUTH ONCE DAILY IN THE MORNING BEFORE BREAKFAST   psyllium (METAMUCIL SMOOTH TEXTURE) 58.6 % powder Take 1 packet by mouth daily. Mix in a Drink.   ramipril (ALTACE) 5 MG capsule Take 1 capsule (5 mg total) by mouth daily.   rosuvastatin (CRESTOR) 10 MG tablet Take 1 tablet by mouth once daily   silver sulfADIAZINE (SILVADENE) 1 % cream Apply to affected area every 2-3 days with dressing changes.   topiramate (TOPAMAX) 50 MG tablet Take 1 tablet by mouth twice daily   No facility-administered medications prior to visit.    Review of Systems      Objective    BP 123/77   Pulse 71   Temp (!) 97.5 F (36.4 C) (Oral)   Ht 6\' 2"  (1.88 m)   Wt 239 lb (108.4 kg)   SpO2 98%   BMI 30.69 kg/m     Physical Exam  Physical Exam   VITALS: BP- 123/77 CHEST: Lungs clear to auscultation, no wheezing, no crackles. CARDIOVASCULAR: Heart rhythm regular. SKIN: Healing skin tears on arm and knee.             No results found for any visits on 02/06/23.  Assessment & Plan     Problem List Items Addressed This Visit     Atrial fibrillation (HCC)   Relevant Orders   TSH+T4F+T3Free   BMP8+EGFR   CBC   Benign essential tremor   Bergmann's syndrome   HYPERTENSION, BENIGN   Hypothyroidism   Relevant Orders   TSH+T4F+T3Free   Other Visit Diagnoses     Need for influenza vaccination    -  Primary   Relevant Orders   Flu Vaccine Trivalent High Dose (Fluad) (Completed)   Chronic anticoagulation       Relevant Orders   CBC           Skin Tear and Knee Injury Healing well after a fall on a rug over a hardwood floor. No stitches required. -Continue to monitor healing process. -Consider home modifications to prevent future falls, such as removing  the rug.  Loose Bowels Resolved. -No further action required at this time.  Atrial Fibrillation Stable on Eliquis 5mg  twice daily. No reported dizziness or other symptoms. -Continue Eliquis 5mg  twice daily.  Hypertension Stable on Altace 5mg  daily. Blood pressure at visit was 123/77. -Continue Altace 5mg  daily.  Hyperlipidemia Stable on Crestor 10mg  daily. -Continue Crestor 10mg  daily.  Hypothyroidism Stable on Synthroid daily. -Check thyroid levels today.  Tremors Stable,  possibly on Topiramate 50mg  daily. -Clarify indication for Topiramate at next visit.  General Health Maintenance / Followup Plans -Check potassium levels today. -Flu vaccination already administered. -Schedule follow-up appointment in January 2025.         Return in about 4 months (around 06/08/2023) for CHRONIC F/U.         Ronnald Ramp, MD  [facility] [phone] (phone) [phone] (fax)  [facility]

## 2023-02-10 ENCOUNTER — Encounter: Payer: Self-pay | Admitting: Family Medicine

## 2023-02-14 ENCOUNTER — Telehealth: Payer: Self-pay | Admitting: Pharmacist

## 2023-02-14 NOTE — Progress Notes (Signed)
02/14/2023  Patient ID: Jared Rice, male   DOB: Jun 18, 1932, 89 y.o.   MRN: [id number]  Pharmacy Quality Measure Review  This patient is appearing on a report for being at risk of failing the adherence measure for hypertension (ACEi/ARB) medications this calendar year.   Medication: Ramipril 5 mg Last fill date: 08/07/22 for 90 day supply  Last filled on 01/13/23 for 90 day supply. Insurance report was not up to date. No action needed at this time.     Marlowe Aschoff, PharmD [facility] Phone Number: [phone]

## 2023-02-23 ENCOUNTER — Other Ambulatory Visit: Payer: Self-pay

## 2023-02-23 ENCOUNTER — Emergency Department: Payer: PPO

## 2023-02-23 ENCOUNTER — Emergency Department
Admission: EM | Admit: 2023-02-23 | Discharge: 2023-02-23 | Disposition: A | Discharge status: Home / Self Care | Payer: PPO | Attending: Emergency Medicine | Admitting: Emergency Medicine

## 2023-02-23 DIAGNOSIS — J432 Centrilobular emphysema: Secondary | ICD-10-CM | POA: Diagnosis not present

## 2023-02-23 DIAGNOSIS — I700 Atherosclerosis of aorta: Secondary | ICD-10-CM | POA: Insufficient documentation

## 2023-02-23 DIAGNOSIS — R42 Dizziness and giddiness: Secondary | ICD-10-CM | POA: Diagnosis not present

## 2023-02-23 DIAGNOSIS — Z1152 Encounter for screening for COVID-19: Secondary | ICD-10-CM | POA: Insufficient documentation

## 2023-02-23 DIAGNOSIS — I959 Hypotension, unspecified: Secondary | ICD-10-CM | POA: Diagnosis not present

## 2023-02-23 DIAGNOSIS — R531 Weakness: Secondary | ICD-10-CM | POA: Diagnosis not present

## 2023-02-23 DIAGNOSIS — J439 Emphysema, unspecified: Secondary | ICD-10-CM | POA: Diagnosis not present

## 2023-02-23 DIAGNOSIS — N390 Urinary tract infection, site not specified: Secondary | ICD-10-CM | POA: Insufficient documentation

## 2023-02-23 DIAGNOSIS — R918 Other nonspecific abnormal finding of lung field: Secondary | ICD-10-CM | POA: Diagnosis not present

## 2023-02-23 DIAGNOSIS — R911 Solitary pulmonary nodule: Secondary | ICD-10-CM | POA: Diagnosis not present

## 2023-02-23 DIAGNOSIS — R0689 Other abnormalities of breathing: Secondary | ICD-10-CM | POA: Diagnosis not present

## 2023-02-23 DIAGNOSIS — I10 Essential (primary) hypertension: Secondary | ICD-10-CM | POA: Diagnosis not present

## 2023-02-23 DIAGNOSIS — J9811 Atelectasis: Secondary | ICD-10-CM | POA: Diagnosis not present

## 2023-02-23 LAB — CBC
HCT: 31.4 % — ABNORMAL LOW (ref 39.0–52.0)
Hemoglobin: 9.9 g/dL — ABNORMAL LOW (ref 13.0–17.0)
MCH: 30.8 pg (ref 26.0–34.0)
MCHC: 31.5 g/dL (ref 30.0–36.0)
MCV: 97.8 fL (ref 80.0–100.0)
Platelets: 121 10*3/uL — ABNORMAL LOW (ref 150–400)
RBC: 3.21 MIL/uL — ABNORMAL LOW (ref 4.22–5.81)
RDW: 15.1 % (ref 11.5–15.5)
WBC: 9.3 10*3/uL (ref 4.0–10.5)
nRBC: 0 % (ref 0.0–0.2)

## 2023-02-23 LAB — URINALYSIS, W/ REFLEX TO CULTURE (INFECTION SUSPECTED)
Bilirubin Urine: NEGATIVE
Glucose, UA: NEGATIVE mg/dL
Ketones, ur: NEGATIVE mg/dL
Nitrite: POSITIVE — AB
Protein, ur: NEGATIVE mg/dL
Specific Gravity, Urine: 1.017 (ref 1.005–1.030)
WBC, UA: >50 WBC/hpf (ref 0–5)
pH: 5 (ref 5.0–8.0)

## 2023-02-23 LAB — BASIC METABOLIC PANEL
Anion gap: 7 (ref 5–15)
BUN: 21 mg/dL (ref 8–23)
CO2: 24 mmol/L (ref 22–32)
Calcium: 8 mg/dL — ABNORMAL LOW (ref 8.9–10.3)
Chloride: 108 mmol/L (ref 98–111)
Creatinine, Ser: 1.16 mg/dL (ref 0.61–1.24)
GFR, Estimated: >60 mL/min (ref >60)
Glucose, Bld: 107 mg/dL — ABNORMAL HIGH (ref 70–99)
Potassium: 3.8 mmol/L (ref 3.5–5.1)
Sodium: 139 mmol/L (ref 135–145)

## 2023-02-23 LAB — LACTIC ACID, PLASMA: Lactic Acid, Venous: 1.2 mmol/L (ref 0.5–1.9)

## 2023-02-23 LAB — SARS CORONAVIRUS 2 BY RT PCR: SARS Coronavirus 2 by RT PCR: NEGATIVE

## 2023-02-23 MED ORDER — CIPROFLOXACIN HCL 500 MG PO TABS: 500.0000 mg | ORAL_TABLET | Freq: Two times a day (BID) | ORAL | 0 refills | Status: AC

## 2023-02-23 MED ORDER — IOHEXOL 300 MG/ML  SOLN
75.0000 mL | Freq: Once | INTRAMUSCULAR | Status: AC | PRN
  Administered 2023-02-23: 75 mL via INTRAVENOUS

## 2023-02-23 MED ORDER — LACTATED RINGERS IV BOLUS
1000.0000 mL | Freq: Once | INTRAVENOUS | Status: AC
  Administered 2023-02-23: 1000 mL via INTRAVENOUS

## 2023-02-23 MED ORDER — CIPROFLOXACIN IN D5W 400 MG/200ML IV SOLN
400.0000 mg | Freq: Once | INTRAVENOUS | Status: AC
  Administered 2023-02-23: 400 mg via INTRAVENOUS
  Filled 2023-02-23: qty 200

## 2023-02-23 NOTE — ED Triage Notes (Addendum)
Pt BIB [facility] from home after being stuck in the bathroom today, as he was not able to get off the toilet. EMS reports pt complaining of generalized weakness for 2-3 days with one episode of vomiting today.   EMS reports CBG of 140. EMS reports pt was febrile (169f) and hypotensive (88/54), EMS reports giving of NS

## 2023-02-23 NOTE — Discharge Instructions (Signed)
Please seek medical attention for any high fevers, chest pain, shortness of breath, change in behavior, persistent vomiting, bloody stool or any other new or concerning symptoms.  

## 2023-02-23 NOTE — ED Provider Notes (Signed)
[facility] Provider Note    Event Date/Time   First MD Initiated Contact with Patient 02/23/23 1520     (approximate)   History   Shaking  HPI  Jared Rice is a 89 y.o. male patient presented to the emergency department today because of concerns for an episode of shaking.  The patient states that the symptoms started earlier today.  The patient has had similar symptoms in the past that have occurred when he has had urinary tract infections.  That being said otherwise he has felt okay.  Has not had any dysuria.  Has not any shortness of breath or cough.  No fevers or chills.     Physical Exam   Triage Vital Signs: ED Triage Vitals  Encounter Vitals Group     BP 02/23/23 1522 (!) 91/59     Systolic BP Percentile --      Diastolic BP Percentile --      Pulse Rate 02/23/23 1522 82     Resp 02/23/23 1522 17     Temp 02/23/23 1522 99.6 F (37.6 C)     Temp Source 02/23/23 1522 Axillary     SpO2 02/23/23 1522 96 %     Weight 02/23/23 1520 237 lb (107.5 kg)     Height 02/23/23 1520 6\' 2"  (1.88 m)     Head Circumference --      Peak Flow --      Pain Score 02/23/23 1520 0     Pain Loc --      Pain Education --      Exclude from Growth Chart --     Most recent vital signs: Vitals:   02/23/23 1522  BP: (!) 91/59  Pulse: 82  Resp: 17  Temp: 99.6 F (37.6 C)  SpO2: 96%   General: Awake, alert, oriented. CV:  Good peripheral perfusion. Irregular rhythm. Resp:  Normal effort. Lungs clear. Abd:  No distention.    ED Results / Procedures / Treatments   Labs (all labs ordered are listed, but only abnormal results are displayed) Labs Reviewed  BASIC METABOLIC PANEL - Abnormal; Notable for the following components:      Result Value   Glucose, Bld 107 (*)    Calcium 8.0 (*)    All other components within normal limits  CBC - Abnormal; Notable for the following components:   RBC 3.21 (*)    Hemoglobin 9.9 (*)    HCT 31.4 (*)     Platelets 121 (*)    All other components within normal limits  URINALYSIS, W/ REFLEX TO CULTURE (INFECTION SUSPECTED) - Abnormal; Notable for the following components:   Color, Urine YELLOW (*)    APPearance HAZY (*)    Hgb urine dipstick SMALL (*)    Nitrite POSITIVE (*)    Leukocytes,Ua SMALL (*)    Bacteria, UA MANY (*)    All other components within normal limits  SARS CORONAVIRUS 2 BY RT PCR  CULTURE, BLOOD (ROUTINE X 2)  CULTURE, BLOOD (ROUTINE X 2)  URINE CULTURE  LACTIC ACID, PLASMA  LACTIC ACID, PLASMA  CBG MONITORING, ED     EKG  I, Phineas Semen, attending physician, personally viewed and interpreted this EKG  EKG Time: 1531 Rate: 87 Rhythm: atrial fibrillation Axis: left axis deviation Intervals: qtc 519 QRS: LBBB ST changes: no st elevation Impression: abnormal ekg    RADIOLOGY  I independently interpreted and visualized the CXR. My interpretation: No pneumonia Radiology interpretation:  IMPRESSION:  1. LEFT basilar atelectasis versus infiltrate.  2. Potential LEFT upper lobe pulmonary nodule. Recommend CT LEFT  chest for further evaluation.      PROCEDURES:  Critical Care performed: No    MEDICATIONS ORDERED IN ED: Medications - No data to display   IMPRESSION / MDM / ASSESSMENT AND PLAN / ED COURSE  I reviewed the triage vital signs and the nursing notes.                              Differential diagnosis includes, but is not limited to, UTI, pneumonia, anemia, electrolyte abnormality  Patient's presentation is most consistent with acute presentation with potential threat to life or bodily function.   The patient is on the cardiac monitor to evaluate for evidence of arrhythmia and/or significant heart rate changes.  Patient presented to the emergency department today because of concerns for shaking.  At the time my exam patient is no longer having these tremors.  Initial blood pressure was low so broad workup was initiated.  UA  is consistent with infection.  No leukocytosis or elevated lactic acid level.  Chest x-ray question pneumonia however patient denies any pneumonia type symptoms.  At however also saw pulmonary nodule and recommended CT chest to further categorize.  I discussed this with the patient.  Patient states he has been treated with Cipro in the past for UTIs. Will give dose of antibiotic here in the emergency department.  CT chest without any acute findings or any concerning nodules.  I discussed this with patient.  Will plan on discharging with prescription for oral antibiotics.      FINAL CLINICAL IMPRESSION(S) / ED DIAGNOSES   Final diagnoses:  Lower urinary tract infectious disease     Note:  This document was prepared using Dragon voice recognition software and may include unintentional dictation errors.    Phineas Semen, MD 02/23/23 [phone]

## 2023-02-26 ENCOUNTER — Ambulatory Visit: Payer: Self-pay | Admitting: *Deleted

## 2023-02-26 LAB — URINE CULTURE: Culture: >=100000 — AB

## 2023-02-26 NOTE — Patient Outreach (Signed)
Care Coordination   Follow Up Visit Note   02/26/2023 Name: Jared Rice MRN: [id number] DOB: 1932/06/09  Jared Rice is a 89 y.o. year old male who sees Simmons-Robinson, Tawanna Cooler, MD for primary care. I spoke with  Jared Jew Cullop by phone today.  What matters to the patients health and wellness today?  Recovering from UTI    Goals Addressed             This Visit's Progress    Effective management of HTN   On track    Care Coordination Interventions: Evaluation of current treatment plan related to hypertension self management and patient's adherence to plan as established by provider Reviewed medications with patient and discussed importance of compliance Discussed plans with patient for ongoing care management follow up and provided patient with direct contact information for care management team Advised patient, providing education and rationale, to monitor blood pressure daily and record, calling PCP for findings outside established parameters Discussed complications of poorly controlled blood pressure such as heart disease, stroke, circulatory complications, vision complications, kidney impairment, sexual dysfunction          SDOH assessments and interventions completed:  No     Care Coordination Interventions:  Yes, provided   Interventions Today    Flowsheet Row Most Recent Value  Chronic Disease   Chronic disease during today's visit Hypertension (HTN), Other  [UTI]  General Interventions   General Interventions Discussed/Reviewed Vaccines, General Interventions Reviewed, Doctor Visits  Vaccines Flu, COVID-19  [Flu shot done, will get Covid shot]  Doctor Visits Discussed/Reviewed Doctor Visits Reviewed, PCP  [Dermatology 10/14, Urology 12/19]  PCP/Specialist Visits Compliance with follow-up visit  [PCP done on 9/12]  Education Interventions   Education Provided Provided Education  Provided Verbal Education On Medication, When to see the doctor  [Now  taking a 7 day course of Cipro]       Follow up plan: Follow up call scheduled for 11/1    Encounter Outcome:  Patient Visit Completed   Kemper Durie, RN, MSN, [facility] Care Management Care Management Coordinator [phone]

## 2023-02-28 LAB — CULTURE, BLOOD (ROUTINE X 2)
Culture: NO GROWTH
Culture: NO GROWTH
Special Requests: ADEQUATE

## 2023-03-10 ENCOUNTER — Ambulatory Visit: Payer: PPO | Admitting: Dermatology

## 2023-03-23 ENCOUNTER — Other Ambulatory Visit: Payer: Self-pay | Admitting: Family Medicine

## 2023-03-24 NOTE — Telephone Encounter (Signed)
Requested medications are due for refill today.  yes  Requested medications are on the active medications list.  yes  Last refill. 12/19/2022 #90 0 rf  Future visit scheduled.   yes  Notes to clinic.  Missing lab - Mg.    Requested Prescriptions  Pending Prescriptions Disp Refills   furosemide (LASIX) 40 MG tablet [Pharmacy Med Name: FUROSEMIDE 40 MG TABLET] 90 tablet 0    Sig: Take 1 tablet by mouth once daily (dose change)     Cardiovascular:  Diuretics - Loop Failed - 03/23/2023  1:27 AM      Failed - Ca in normal range and within 180 days    Calcium  Date Value Ref Range Status  02/23/2023 8.0 (L) 8.9 - 10.3 mg/dL Final   Calcium, Total  Date Value Ref Range Status  10/13/2013 8.6 8.5 - 10.1 mg/dL Final         Failed - Mg Level in normal range and within 180 days    No results found for: "MG"       Passed - K in normal range and within 180 days    Potassium  Date Value Ref Range Status  02/23/2023 3.8 3.5 - 5.1 mmol/L Final  04/27/2014 3.7 3.5 - 5.1 mmol/L Final         Passed - Na in normal range and within 180 days    Sodium  Date Value Ref Range Status  02/23/2023 139 135 - 145 mmol/L Final  06/05/2022 145 (H) 134 - 144 mmol/L Final  10/13/2013 141 136 - 145 mmol/L Final         Passed - Cr in normal range and within 180 days    Creatinine  Date Value Ref Range Status  10/13/2013 0.87 0.60 - 1.30 mg/dL Final   Creatinine, Ser  Date Value Ref Range Status  02/23/2023 1.16 0.61 - 1.24 mg/dL Final         Passed - Cl in normal range and within 180 days    Chloride  Date Value Ref Range Status  02/23/2023 108 98 - 111 mmol/L Final  10/13/2013 105 98 - 107 mmol/L Final         Passed - Last BP in normal range    BP Readings from Last 1 Encounters:  02/23/23 103/74         Passed - Valid encounter within last 6 months    Recent Outpatient Visits           1 month ago Need for influenza vaccination   [facility]  Simmons-Robinson, [place], MD   3 months ago Skin tear of forearm without complication, left, subsequent encounter   [facility] Malva Limes, MD   5 months ago Skin tear of forearm without complication, left, subsequent encounter   [facility] Merita Norton T, FNP   5 months ago Skin tear of forearm without complication, left, subsequent encounter   [facility] Merita Norton T, FNP   6 months ago Skin tear of forearm without complication, left, subsequent encounter   [facility] [name], FNP       Future Appointments             In 1 month McGowan, Elana [facility] [place]   In 2 months Simmons-Robinson, Tawanna Cooler, MD [facility]   In 4 months Deirdre Evener,  MD [facility]

## 2023-03-28 ENCOUNTER — Ambulatory Visit: Payer: Self-pay | Admitting: *Deleted

## 2023-03-28 NOTE — Patient Outreach (Signed)
  Care Coordination   Follow Up Visit Note   03/28/2023 Name: Jared Rice MRN: [id number] DOB: 09-13-1932  Jared Rice is a 89 y.o. year old male who sees Simmons-Robinson, Tawanna Cooler, MD for primary care. I spoke with  Jared Rice by phone today.  What matters to the patients health and wellness today?  Patient report he is doing well, does not feel he has any further needs at this time. He will contact this RNCM if needs change.     Goals Addressed             This Visit's Progress    COMPLETED: Effective management of HTN   On track    Care Coordination Interventions: Evaluation of current treatment plan related to hypertension self management and patient's adherence to plan as established by provider Reviewed medications with patient and discussed importance of compliance Discussed plans with patient for ongoing care management follow up and provided patient with direct contact information for care management team Advised patient, providing education and rationale, to monitor blood pressure daily and record, calling PCP for findings outside established parameters Discussed complications of poorly controlled blood pressure such as heart disease, stroke, circulatory complications, vision complications, kidney impairment, sexual dysfunction          SDOH assessments and interventions completed:  No     Care Coordination Interventions:  Yes, provided   Interventions Today    Flowsheet Row Most Recent Value  Chronic Disease   Chronic disease during today's visit Hypertension (HTN), Other  [recovery from recent UTI]  General Interventions   General Interventions Discussed/Reviewed General Interventions Reviewed, Vaccines, Doctor Visits  Vaccines Flu  Doctor Visits Discussed/Reviewed Doctor Visits Reviewed, Specialist  [Aware of missed dermatology appt, he will call to reschedule.  upcoming with urology on 12/19]  PCP/Specialist Visits Compliance with follow-up  visit  Education Interventions   Education Provided Provided Education  Provided Verbal Education On Medication, When to see the doctor  [completed course of antibiotic, no recurrent UTI symptoms]       Follow up plan: No further intervention required.   Encounter Outcome:  Patient Visit Completed   Kemper Durie RN, MSN, CCM Hilda  [facility] RN Care Coordinator Direct Dial: [phone] / Main [phone] Fax [phone] Email: [email or web address]: [email or web address]

## 2023-03-30 ENCOUNTER — Other Ambulatory Visit: Payer: Self-pay | Admitting: Family Medicine

## 2023-05-11 ENCOUNTER — Telehealth: Payer: Self-pay | Admitting: Family Medicine

## 2023-05-12 NOTE — Telephone Encounter (Signed)
Daughter Evette Cristal called saying he needs the refill today while she is here so she can get his bx filled  CB@

## 2023-05-12 NOTE — Telephone Encounter (Signed)
Jared Rice was made aware.

## 2023-05-12 NOTE — Telephone Encounter (Signed)
Sent to the pharmacy today. This morning.

## 2023-05-13 ENCOUNTER — Other Ambulatory Visit: Payer: Self-pay | Admitting: Cardiology

## 2023-05-14 ENCOUNTER — Telehealth: Payer: Self-pay

## 2023-05-14 MED ORDER — FLUCONAZOLE 200 MG PO TABS: ORAL_TABLET | ORAL | 2 refills | Status: DC

## 2023-05-14 MED ORDER — PREDNISONE 10 MG PO TABS: ORAL_TABLET | ORAL | 0 refills | Status: DC

## 2023-05-14 NOTE — Telephone Encounter (Signed)
Discussed with patient medication and how to take them. Pt asked about a topical cream her could put on rash and I told him it would be OK to use OTC HC cream PRN. Advised patient to contact our office if any issues or reactions with medications.

## 2023-05-14 NOTE — Telephone Encounter (Signed)
Pt c/o severe intertrigo flare in the groin and would like a refills of Skin Medicinals mix sent in, but because it will take a few days for him to receive it he was wondering if there was another topical we could send to his local pharmacy. He has used Diflucan 200 mg once weekly x 4 weeks and tolerated it well. Preferred pharmacy is [facility] [address] Pt in a lot of pain and experiencing a lot of irritation.

## 2023-05-14 NOTE — Telephone Encounter (Signed)
Prescription refill request for Eliquis received. Indication: AF Last office visit: 06/24/22  K Johnson PA-C Scr: Age: 89 Weight: 112.5kg  Based on above findings Eliquis 5mg  twice daily is the appropriate dose.  Pt is past due for appt with Dr [place].  Message sent to schedulers. Refill approved x 1.

## 2023-05-15 ENCOUNTER — Ambulatory Visit: Payer: PPO | Admitting: Urology

## 2023-05-15 ENCOUNTER — Telehealth: Payer: Self-pay

## 2023-05-15 NOTE — Telephone Encounter (Signed)
I tried leaving a voicemail returning patient's call, but the phone lines disconnected. Pt called asking about a topical medication he can use in his groin until the Skin Medicinals mix arrives on Monday. Yesterday I sent in the oral medications you requested and advised patient he can use OTC HC cream or ointment. Is there a topical prescription you would like for me to send in for patient?

## 2023-05-19 NOTE — Telephone Encounter (Signed)
I called and spoke with patient and he has not picked up the oral prescriptions, but states that he will now, and he's glad I reminded him. Pt states Skin Medicinals mix should arrive to his home today.

## 2023-05-27 ENCOUNTER — Telehealth: Payer: Self-pay

## 2023-05-27 NOTE — Telephone Encounter (Addendum)
There is no documentation in his record of COVID vaccination since 2021. He may need to check with pharmacy if he got vaccinated there.     Information relayed to his wife, [name], on [place]. She is going to call the pharmacy to ask them.

## 2023-05-27 NOTE — Telephone Encounter (Signed)
Copied from CRM [id number]. Topic: General - Inquiry >> May 26, 2023  3:56 PM Yolanda T wrote: Reason for CRM: patient needs a call back to let him know when he got his Covid shot. Please f/u with patient

## 2023-06-02 NOTE — Progress Notes (Deleted)
 09/12/2022 2:29 PM    Jared Rice 1932-09-18 [id number]   Referring provider: Ronnald Ramp, MD [address] [place],  [place] [address]   Urological history 1. BPH with LU TS -PSA (06/2022) 3.5  -Doxazosin 8 mg daily finasteride 5 mg daily   2.  Right hydrocele -Right hydrocelectomy (2015)      Chief Complaint  Patient presents with   Benign Prostatic Hypertrophy      HPI: Jared Rice is a 89 y.o. male who presents today for a six month follow up with his wife, Jared Rice.    Previous records reviewed.   I PSS ***   PVR *** mL        IPSS       Row Name 09/12/22 1400                   International Prostate Symptom Score    How often have you had the sensation of not emptying your bladder? Not at All        How often have you had to urinate less than every two hours? Less than 1 in 5 times        How often have you found you stopped and started again several times when you urinated? Less than 1 in 5 times        How often have you found it difficult to postpone urination? Less than half the time        How often have you had a weak urinary stream? Less than 1 in 5 times        How often have you had to strain to start urination? Not at All        How many times did you typically get up at night to urinate? 1 Time        Total IPSS Score 6               Quality of Life due to urinary symptoms    If you were to spend the rest of your life with your urinary condition just the way it is now how would you feel about that? Mostly Satisfied                        Score:  1-7 Mild 8-19 Moderate 20-35 Severe      PMH:     Past Medical History:  Diagnosis Date   Actinic keratosis     Anginal pain     Aortic atherosclerosis     Arthritis     Atrial fibrillation     Basal cell carcinoma 02/06/2010    Left shoulder. Superficial.   Basal cell carcinoma 10/11/2013    Right medial forearm. Superficial   Basal cell  carcinoma 04/10/2015    Right inf. lat. thigh. Superficial   Basal cell carcinoma 12/09/2016    Right cheek. Superficial.   Chronic airway obstruction, not elsewhere classified     Colon polyps     Complication of anesthesia      SPINAL DID NOT WORK FOR LAST KNEE REPLACEMENT AND HAD TO HAVE GA   Coronary atherosclerosis of native coronary artery      nonobstructive   Diabetes mellitus without complication     Essential hypertension, benign     GERD (gastroesophageal reflux disease)      H/O   Glucose intolerance (impaired glucose tolerance)     Heart murmur  History of kidney stones     Hyperlipidemia     Hypothyroidism     Iron deficiency anemia     Knee pain, right     LBBB (left bundle branch block)     Occlusion and stenosis of carotid artery without mention of cerebral infarction      wears compression stockings   Other dyspnea and respiratory abnormality      w/ pseuodowheeze resolves with purse lip manuever   Precordial pain     Shoulder pain, right     Squamous cell carcinoma of skin 10/06/2007    Right forearm. SCCis   Squamous cell carcinoma of skin 10/11/2013    Left mid lat. pretibial. KA-like pattern.   Tremor     Unspecified sleep apnea      HAS NOT USED CPAP IN 20 YEARS   Venous insufficiency     Wears dentures      full upper   Wound cellulitis- right lower leg anterior  10/29/2019          Surgical History:      Past Surgical History:  Procedure Laterality Date   CARDIAC CATHETERIZATION        X2   COLONOSCOPY   03/17/12    Dr Byrnett-diverticulosis   ESOPHAGOGASTRODUODENOSCOPY (EGD) WITH PROPOFOL N/A 11/25/2014    Procedure: ESOPHAGOGASTRODUODENOSCOPY (EGD) WITH PROPOFOL;  Surgeon: Midge Minium, MD;  Location: [facility];  Service: Endoscopy;  Laterality: N/A;  with biopsy   ESOPHAGOGASTRODUODENOSCOPY (EGD) WITH PROPOFOL N/A 01/05/2015    Procedure: ESOPHAGOGASTRODUODENOSCOPY (EGD) WITH PROPOFOL;  Surgeon: Midge Minium, MD;  Location:  [facility];  Service: Endoscopy;  Laterality: N/A;   EYE SURGERY Bilateral      cataract    KNEE ARTHROPLASTY Right 09/06/2020    Procedure: COMPUTER ASSISTED TOTAL KNEE ARTHROPLASTY;  Surgeon: Donato Heinz, MD;  Location: [facility];  Service: Orthopedics;  Laterality: Right;   TOTAL KNEE ARTHROPLASTY Left 2012          Home Medications:  Allergies as of 09/12/2022         Reactions    Dexilant [dexlansoprazole] Rash    Primidone Itching, Rash            Medication List           Accurate as of September 12, 2022  2:29 PM. If you have any questions, ask your nurse or doctor.              diphenhydramine-acetaminophen 25-500 MG Tabs tablet Commonly known as: TYLENOL PM Take 2 tablets by mouth at bedtime.    doxazosin 8 MG tablet Commonly known as: CARDURA Take 1 tablet by mouth once daily    Eliquis 5 MG Tabs tablet Generic drug: apixaban Take 1 tablet by mouth twice a day    furosemide 40 MG tablet Commonly known as: LASIX Take 1 tablet by mouth once daily (dose change)    levothyroxine 88 MCG tablet Commonly known as: SYNTHROID TAKE 1 TABLET BY MOUTH ONCE DAILY IN THE MORNING BEFORE BREAKFAST    ramipril 5 MG capsule Commonly known as: ALTACE Take 1 capsule by mouth once daily    rosuvastatin 10 MG tablet Commonly known as: CRESTOR Take 1 tablet by mouth once daily    topiramate 50 MG tablet Commonly known as: TOPAMAX Take 1 tablet by mouth twice daily             Allergies:  Allergies      Allergies  Allergen Reactions   Dexilant [Dexlansoprazole] Rash   Primidone Itching and Rash        Family History:      Family History  Problem Relation Age of Onset   Heart failure Mother 38        congestive   Heart attack Father 30   Alcohol abuse Father     Alzheimer's disease Brother 19   Alzheimer's disease Sister 46   Colon cancer Neg Hx     Liver disease Neg Hx            Social History:  reports that he quit smoking about  41 years ago. His smoking use included cigarettes. He has a 62.00 pack-year smoking history. He has never used smokeless tobacco. He reports that he does not drink alcohol and does not use drugs.     Physical Exam: Blood pressure 131/70, pulse 75.  Constitutional:  Well nourished. Alert and oriented, No acute distress. HEENT: [place] AT, moist mucus membranes.  Trachea midline, no masses. Cardiovascular: No clubbing, cyanosis, or edema. Respiratory: Normal respiratory effort, no increased work of breathing. GI: Abdomen is soft, non tender, non distended, no abdominal masses. Liver and spleen not palpable.  No hernias appreciated.  Stool sample for occult testing is not indicated.   GU: No CVA tenderness.  No bladder fullness or masses.  Patient with circumcised/uncircumcised phallus. ***Foreskin easily retracted***  Urethral meatus is patent.  No penile discharge. No penile lesions or rashes. Scrotum without lesions, cysts, rashes and/or edema.  Testicles are located scrotally bilaterally. No masses are appreciated in the testicles. Left and right epididymis are normal. Rectal: Patient with  normal sphincter tone. Anus and perineum without scarring or rashes. No rectal masses are appreciated. Prostate is approximately *** grams, *** nodules are appreciated. Seminal vesicles are normal. Skin: No rashes, bruises or suspicious lesions. Lymph: No cervical or inguinal adenopathy. Neurologic: Grossly intact, no focal deficits, moving all 4 extremities. Psychiatric: Normal mood and affect.   Laboratory data:      Latest Ref Rng & Units 02/23/2023    3:21 PM 12/25/2022    8:57 PM 06/05/2022   10:21 AM  BMP  Glucose 70 - 99 mg/dL 161  096  97   BUN 8 - 23 mg/dL 21  18  17    Creatinine 0.61 - 1.24 mg/dL 0.45  4.09  8.11   BUN/Creat Ratio 10 - 24   15   Sodium 135 - 145 mmol/L 139  135  145   Potassium 3.5 - 5.1 mmol/L 3.8  3.3  4.3   Chloride 98 - 111 mmol/L 108  100  106   CO2 22 - 32 mmol/L 24  29   27    Calcium 8.9 - 10.3 mg/dL 8.0  7.7  9.0     Urinalysis    Component Value Date/Time   COLORURINE YELLOW (A) 02/23/2023 1623   APPEARANCEUR HAZY (A) 02/23/2023 1623   LABSPEC 1.017 02/23/2023 1623   PHURINE 5.0 02/23/2023 1623   GLUCOSEU NEGATIVE 02/23/2023 1623   HGBUR SMALL (A) 02/23/2023 1623   BILIRUBINUR NEGATIVE 02/23/2023 1623   BILIRUBINUR neg 07/01/2022 1427   KETONESUR NEGATIVE 02/23/2023 1623   PROTEINUR NEGATIVE 02/23/2023 1623   UROBILINOGEN 4.0 (A) 07/01/2022 1427   NITRITE POSITIVE (A) 02/23/2023 1623   LEUKOCYTESUR SMALL (A) 02/23/2023 1623  I have reviewed the labs.     Pertinent imaging: ***   Assessment & Plan:     1. Benign prostatic hyperplasia  with LUTS -Continue doxazosin and finasteride   2. Incomplete bladder emptying - his serum creatinine is stable -he is not having symptoms of retention  3. Right hydrocele -Large right hydrocele.  He has no bothersome symptoms   No follow-ups on file.    Cloretta Ned   [facility] [address] [place], [place] [address]

## 2023-06-03 ENCOUNTER — Ambulatory Visit: Payer: PPO | Admitting: Urology

## 2023-06-03 DIAGNOSIS — N401 Enlarged prostate with lower urinary tract symptoms: Secondary | ICD-10-CM

## 2023-06-03 DIAGNOSIS — N433 Hydrocele, unspecified: Secondary | ICD-10-CM

## 2023-06-03 DIAGNOSIS — R339 Retention of urine, unspecified: Secondary | ICD-10-CM

## 2023-06-05 ENCOUNTER — Telehealth: Payer: Self-pay | Admitting: Urology

## 2023-06-05 NOTE — Telephone Encounter (Signed)
 Mr. Jared Rice missed his follow up appointment on Tuesday for repeat ultrasound of his bladder.  Please call him and have him reschedule for the next available appointment.

## 2023-06-06 ENCOUNTER — Encounter: Payer: Self-pay | Admitting: Urology

## 2023-06-09 ENCOUNTER — Ambulatory Visit: Payer: PPO | Admitting: Family Medicine

## 2023-06-09 NOTE — Progress Notes (Deleted)
 Established patient visit   Patient: Jared Rice   DOB: 08-26-1932   89 y.o. Male  MRN: [id number] Visit Date: 06/09/2023  Today's healthcare provider: Rockie Agent, MD   No chief complaint on file.  Subjective       Discussed the use of AI scribe software for clinical note transcription with the patient, who gave verbal consent to proceed.  History of Present Illness             Past Medical History:  Diagnosis Date   Actinic keratosis    Anginal pain (HCC)    Aortic atherosclerosis (HCC)    Arthritis    Atrial fibrillation (HCC)    Basal cell carcinoma 02/06/2010   Left shoulder. Superficial.   Basal cell carcinoma 10/11/2013   Right medial forearm. Superficial   Basal cell carcinoma 04/10/2015   Right inf. lat. thigh. Superficial   Basal cell carcinoma 12/09/2016   Right cheek. Superficial.   Chronic airway obstruction, not elsewhere classified    Colon polyps    Complication of anesthesia    SPINAL DID NOT WORK FOR LAST KNEE REPLACEMENT AND HAD TO HAVE GA   Coronary atherosclerosis of native coronary artery    nonobstructive   Diabetes mellitus without complication (HCC)    Essential hypertension, benign    GERD (gastroesophageal reflux disease)    H/O   Glucose intolerance (impaired glucose tolerance)    Heart murmur    History of kidney stones    Hyperlipidemia    Hypothyroidism    Iron deficiency anemia    Knee pain, right    LBBB (left bundle branch block)    Occlusion and stenosis of carotid artery without mention of cerebral infarction    wears compression stockings   Other dyspnea and respiratory abnormality    w/ pseuodowheeze resolves with purse lip manuever   Precordial pain    Shoulder pain, right    Squamous cell carcinoma of skin 10/06/2007   Right forearm. SCCis   Squamous cell carcinoma of skin 10/11/2013   Left mid lat. pretibial. KA-like pattern.   Tremor    Unspecified sleep apnea    HAS NOT USED CPAP IN  20 YEARS   Venous insufficiency    Wears dentures    full upper   Wound cellulitis- right lower leg anterior  10/29/2019    Medications: Outpatient Medications Prior to Visit  Medication Sig   apixaban  (ELIQUIS ) 5 MG TABS tablet Take 1 tablet (5 mg total) by mouth 2 (two) times daily.   apixaban  (ELIQUIS ) 5 MG TABS tablet Take 1 tablet (5 mg total) by mouth 2 (two) times daily.   apixaban  (ELIQUIS ) 5 MG TABS tablet TAKE 1 TABLET BY MOUTH TWICE A DAY   cyanocobalamin  (VITAMIN B12) 1000 MCG tablet Take 1 tablet (1,000 mcg total) by mouth daily.   diphenhydramine -acetaminophen  (TYLENOL  PM) 25-500 MG TABS Take 2 tablets by mouth at bedtime.   doxazosin  (CARDURA ) 8 MG tablet TAKE 1 TABLET BY MOUTH ONCE DAILY   fluconazole  (DIFLUCAN ) 200 MG tablet Take one tab po QW x 4 weeks.   furosemide  (LASIX ) 40 MG tablet TAKE 1 TABLET BY MOUTH ONCE DAILY (DOSE CHANGE)   levothyroxine  (SYNTHROID ) 88 MCG tablet TAKE 1 TABLET BY MOUTH ONCE DAILY IN THE MORNING BEFORE BREAKFAST   predniSONE  (DELTASONE ) 10 MG tablet Take one tab po QAM x 5 days. Then take one tab in the morning every other day for 5 doses.  ramipril  (ALTACE ) 5 MG capsule Take 1 capsule (5 mg total) by mouth daily.   rosuvastatin  (CRESTOR ) 10 MG tablet Take 1 tablet by mouth once daily   silver  sulfADIAZINE  (SILVADENE ) 1 % cream Apply to affected area every 2-3 days with dressing changes.   topiramate  (TOPAMAX ) 50 MG tablet Take 1 tablet by mouth twice daily   No facility-administered medications prior to visit.    Review of Systems  {Insert previous labs (optional):23779} {See past labs  Heme  Chem  Endocrine  Serology  Results Review (optional):1}   Objective    There were no vitals taken for this visit. {Insert last BP/Wt (optional):23777}{See vitals history (optional):1}    Physical Exam  ***  No results found for any visits on 06/09/23.  Assessment & Plan     Problem List Items Addressed This Visit    None   Assessment and Plan              No follow-ups on file.         Rockie Agent, MD  [facility] [phone] (phone) [phone] (fax)  [facility]

## 2023-06-16 NOTE — Progress Notes (Deleted)
09/12/2022 2:29 PM    Jared Rice 1932-09-18 [id number]   Referring provider: Ronnald Ramp, MD [address] [place],  [place] [address]   Urological history 1. BPH with LU TS -PSA (06/2022) 3.5  -Doxazosin 8 mg daily finasteride 5 mg daily   2.  Right hydrocele -Right hydrocelectomy (2015)      Chief Complaint  Patient presents with   Benign Prostatic Hypertrophy      HPI: Jared Rice is a 89 y.o. male who presents today for a six month follow up with his wife, Jared Rice.    Previous records reviewed.   I PSS ***   PVR *** mL        IPSS       Row Name 09/12/22 1400                   International Prostate Symptom Score    How often have you had the sensation of not emptying your bladder? Not at All        How often have you had to urinate less than every two hours? Less than 1 in 5 times        How often have you found you stopped and started again several times when you urinated? Less than 1 in 5 times        How often have you found it difficult to postpone urination? Less than half the time        How often have you had a weak urinary stream? Less than 1 in 5 times        How often have you had to strain to start urination? Not at All        How many times did you typically get up at night to urinate? 1 Time        Total IPSS Score 6               Quality of Life due to urinary symptoms    If you were to spend the rest of your life with your urinary condition just the way it is now how would you feel about that? Mostly Satisfied                        Score:  1-7 Mild 8-19 Moderate 20-35 Severe      PMH:     Past Medical History:  Diagnosis Date   Actinic keratosis     Anginal pain     Aortic atherosclerosis     Arthritis     Atrial fibrillation     Basal cell carcinoma 02/06/2010    Left shoulder. Superficial.   Basal cell carcinoma 10/11/2013    Right medial forearm. Superficial   Basal cell  carcinoma 04/10/2015    Right inf. lat. thigh. Superficial   Basal cell carcinoma 12/09/2016    Right cheek. Superficial.   Chronic airway obstruction, not elsewhere classified     Colon polyps     Complication of anesthesia      SPINAL DID NOT WORK FOR LAST KNEE REPLACEMENT AND HAD TO HAVE GA   Coronary atherosclerosis of native coronary artery      nonobstructive   Diabetes mellitus without complication     Essential hypertension, benign     GERD (gastroesophageal reflux disease)      H/O   Glucose intolerance (impaired glucose tolerance)     Heart murmur  History of kidney stones     Hyperlipidemia     Hypothyroidism     Iron deficiency anemia     Knee pain, right     LBBB (left bundle branch block)     Occlusion and stenosis of carotid artery without mention of cerebral infarction      wears compression stockings   Other dyspnea and respiratory abnormality      w/ pseuodowheeze resolves with purse lip manuever   Precordial pain     Shoulder pain, right     Squamous cell carcinoma of skin 10/06/2007    Right forearm. SCCis   Squamous cell carcinoma of skin 10/11/2013    Left mid lat. pretibial. KA-like pattern.   Tremor     Unspecified sleep apnea      HAS NOT USED CPAP IN 20 YEARS   Venous insufficiency     Wears dentures      full upper   Wound cellulitis- right lower leg anterior  10/29/2019          Surgical History:      Past Surgical History:  Procedure Laterality Date   CARDIAC CATHETERIZATION        X2   COLONOSCOPY   03/17/12    Dr Byrnett-diverticulosis   ESOPHAGOGASTRODUODENOSCOPY (EGD) WITH PROPOFOL N/A 11/25/2014    Procedure: ESOPHAGOGASTRODUODENOSCOPY (EGD) WITH PROPOFOL;  Surgeon: Midge Minium, MD;  Location: [facility];  Service: Endoscopy;  Laterality: N/A;  with biopsy   ESOPHAGOGASTRODUODENOSCOPY (EGD) WITH PROPOFOL N/A 01/05/2015    Procedure: ESOPHAGOGASTRODUODENOSCOPY (EGD) WITH PROPOFOL;  Surgeon: Midge Minium, MD;  Location:  [facility];  Service: Endoscopy;  Laterality: N/A;   EYE SURGERY Bilateral      cataract    KNEE ARTHROPLASTY Right 09/06/2020    Procedure: COMPUTER ASSISTED TOTAL KNEE ARTHROPLASTY;  Surgeon: Donato Heinz, MD;  Location: [facility];  Service: Orthopedics;  Laterality: Right;   TOTAL KNEE ARTHROPLASTY Left 2012          Home Medications:  Allergies as of 09/12/2022         Reactions    Dexilant [dexlansoprazole] Rash    Primidone Itching, Rash            Medication List           Accurate as of September 12, 2022  2:29 PM. If you have any questions, ask your nurse or doctor.              diphenhydramine-acetaminophen 25-500 MG Tabs tablet Commonly known as: TYLENOL PM Take 2 tablets by mouth at bedtime.    doxazosin 8 MG tablet Commonly known as: CARDURA Take 1 tablet by mouth once daily    Eliquis 5 MG Tabs tablet Generic drug: apixaban Take 1 tablet by mouth twice a day    furosemide 40 MG tablet Commonly known as: LASIX Take 1 tablet by mouth once daily (dose change)    levothyroxine 88 MCG tablet Commonly known as: SYNTHROID TAKE 1 TABLET BY MOUTH ONCE DAILY IN THE MORNING BEFORE BREAKFAST    ramipril 5 MG capsule Commonly known as: ALTACE Take 1 capsule by mouth once daily    rosuvastatin 10 MG tablet Commonly known as: CRESTOR Take 1 tablet by mouth once daily    topiramate 50 MG tablet Commonly known as: TOPAMAX Take 1 tablet by mouth twice daily             Allergies:  Allergies      Allergies  Allergen Reactions   Dexilant [Dexlansoprazole] Rash   Primidone Itching and Rash        Family History:      Family History  Problem Relation Age of Onset   Heart failure Mother 38        congestive   Heart attack Father 30   Alcohol abuse Father     Alzheimer's disease Brother 19   Alzheimer's disease Sister 46   Colon cancer Neg Hx     Liver disease Neg Hx            Social History:  reports that he quit smoking about  41 years ago. His smoking use included cigarettes. He has a 62.00 pack-year smoking history. He has never used smokeless tobacco. He reports that he does not drink alcohol and does not use drugs.     Physical Exam: Blood pressure 131/70, pulse 75.  Constitutional:  Well nourished. Alert and oriented, No acute distress. HEENT: [place] AT, moist mucus membranes.  Trachea midline, no masses. Cardiovascular: No clubbing, cyanosis, or edema. Respiratory: Normal respiratory effort, no increased work of breathing. GI: Abdomen is soft, non tender, non distended, no abdominal masses. Liver and spleen not palpable.  No hernias appreciated.  Stool sample for occult testing is not indicated.   GU: No CVA tenderness.  No bladder fullness or masses.  Patient with circumcised/uncircumcised phallus. ***Foreskin easily retracted***  Urethral meatus is patent.  No penile discharge. No penile lesions or rashes. Scrotum without lesions, cysts, rashes and/or edema.  Testicles are located scrotally bilaterally. No masses are appreciated in the testicles. Left and right epididymis are normal. Rectal: Patient with  normal sphincter tone. Anus and perineum without scarring or rashes. No rectal masses are appreciated. Prostate is approximately *** grams, *** nodules are appreciated. Seminal vesicles are normal. Skin: No rashes, bruises or suspicious lesions. Lymph: No cervical or inguinal adenopathy. Neurologic: Grossly intact, no focal deficits, moving all 4 extremities. Psychiatric: Normal mood and affect.   Laboratory data:      Latest Ref Rng & Units 02/23/2023    3:21 PM 12/25/2022    8:57 PM 06/05/2022   10:21 AM  BMP  Glucose 70 - 99 mg/dL 161  096  97   BUN 8 - 23 mg/dL 21  18  17    Creatinine 0.61 - 1.24 mg/dL 0.45  4.09  8.11   BUN/Creat Ratio 10 - 24   15   Sodium 135 - 145 mmol/L 139  135  145   Potassium 3.5 - 5.1 mmol/L 3.8  3.3  4.3   Chloride 98 - 111 mmol/L 108  100  106   CO2 22 - 32 mmol/L 24  29   27    Calcium 8.9 - 10.3 mg/dL 8.0  7.7  9.0     Urinalysis    Component Value Date/Time   COLORURINE YELLOW (A) 02/23/2023 1623   APPEARANCEUR HAZY (A) 02/23/2023 1623   LABSPEC 1.017 02/23/2023 1623   PHURINE 5.0 02/23/2023 1623   GLUCOSEU NEGATIVE 02/23/2023 1623   HGBUR SMALL (A) 02/23/2023 1623   BILIRUBINUR NEGATIVE 02/23/2023 1623   BILIRUBINUR neg 07/01/2022 1427   KETONESUR NEGATIVE 02/23/2023 1623   PROTEINUR NEGATIVE 02/23/2023 1623   UROBILINOGEN 4.0 (A) 07/01/2022 1427   NITRITE POSITIVE (A) 02/23/2023 1623   LEUKOCYTESUR SMALL (A) 02/23/2023 1623  I have reviewed the labs.     Pertinent imaging: ***   Assessment & Plan:     1. Benign prostatic hyperplasia  with LUTS -Continue doxazosin and finasteride   2. Incomplete bladder emptying - his serum creatinine is stable -he is not having symptoms of retention  3. Right hydrocele -Large right hydrocele.  He has no bothersome symptoms   No follow-ups on file.    Cloretta Ned   [facility] [address] [place], [place] [address]

## 2023-06-17 ENCOUNTER — Ambulatory Visit: Payer: PPO | Admitting: Urology
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
## 2023-06-18 ENCOUNTER — Ambulatory Visit: Payer: PPO | Admitting: Urology

## 2023-06-18 ENCOUNTER — Ambulatory Visit: Payer: PPO | Admitting: Cardiology
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
## 2023-07-02 ENCOUNTER — Ambulatory Visit: Payer: PPO | Attending: Cardiology | Admitting: Cardiology

## 2023-07-02 ENCOUNTER — Encounter: Payer: Self-pay | Admitting: Cardiology

## 2023-07-02 VITALS — BP 128/76 | HR 76 | Ht 74.0 in | Wt 243.8 lb

## 2023-07-02 DIAGNOSIS — I2510 Atherosclerotic heart disease of native coronary artery without angina pectoris: Secondary | ICD-10-CM

## 2023-07-02 DIAGNOSIS — I10 Essential (primary) hypertension: Secondary | ICD-10-CM | POA: Diagnosis not present

## 2023-07-02 DIAGNOSIS — E785 Hyperlipidemia, unspecified: Secondary | ICD-10-CM | POA: Diagnosis not present

## 2023-07-02 DIAGNOSIS — I4819 Other persistent atrial fibrillation: Secondary | ICD-10-CM

## 2023-07-02 MED ORDER — APIXABAN 5 MG PO TABS: 5.0000 mg | ORAL_TABLET | Freq: Two times a day (BID) | ORAL | 3 refills | Status: DC

## 2023-07-02 NOTE — Patient Instructions (Signed)
 Medication Instructions:  Continue all medications *If you need a refill on your cardiac medications before your next appointment, please call your pharmacy*   Lab Work: None ordered   Testing/Procedures: None ordered   Follow-Up: At [facility], you and your health needs are our priority.  As part of our continuing mission to provide you with exceptional heart care, we have created designated Provider Care Teams.  These Care Teams include your primary Cardiologist (physician) and Advanced Practice Providers (APPs -  Physician Assistants and Nurse Practitioners) who all work together to provide you with the care you need, when you need it.  We recommend signing up for the patient portal called MyChart.  Sign up information is provided on this After Visit Summary.  MyChart is used to connect with patients for Virtual Visits (Telemedicine).  Patients are able to view lab/test results, encounter notes, upcoming appointments, etc.  Non-urgent messages can be sent to your provider as well.   To learn more about what you can do with MyChart, go to [email or web address].    Your next appointment:  1 year   Call in Oct to schedule Feb appointment     Provider:  Dr.Jordan

## 2023-07-17 ENCOUNTER — Other Ambulatory Visit: Payer: Self-pay | Admitting: Family Medicine

## 2023-07-17 DIAGNOSIS — I10 Essential (primary) hypertension: Secondary | ICD-10-CM

## 2023-07-17 NOTE — Telephone Encounter (Signed)
Requested Prescriptions  Pending Prescriptions Disp Refills   ramipril (ALTACE) 5 MG capsule [Pharmacy Med Name: RAMIPRIL 5 MG CAPSULE] 90 capsule 0    Sig: TAKE 1 CAPSULE BY MOUTH EVERY DAY     Cardiovascular:  ACE Inhibitors Passed - 07/17/2023  1:51 PM      Passed - Cr in normal range and within 180 days    Creatinine  Date Value Ref Range Status  10/13/2013 0.87 0.60 - 1.30 mg/dL Final   Creatinine, Ser  Date Value Ref Range Status  02/23/2023 1.16 0.61 - 1.24 mg/dL Final         Passed - K in normal range and within 180 days    Potassium  Date Value Ref Range Status  02/23/2023 3.8 3.5 - 5.1 mmol/L Final  04/27/2014 3.7 3.5 - 5.1 mmol/L Final         Passed - Patient is not pregnant      Passed - Last BP in normal range    BP Readings from Last 1 Encounters:  07/02/23 128/76         Passed - Valid encounter within last 6 months    Recent Outpatient Visits           5 months ago Need for influenza vaccination   [facility] Simmons-Robinson, [place], MD   7 months ago Skin tear of forearm without complication, left, subsequent encounter   [facility] Malva Limes, MD   9 months ago Skin tear of forearm without complication, left, subsequent encounter   [facility] Merita Norton T, FNP   9 months ago Skin tear of forearm without complication, left, subsequent encounter   [facility] Merita Norton T, FNP   9 months ago Skin tear of forearm without complication, left, subsequent encounter   [facility] [name], FNP       Future Appointments             In 4 days Deirdre Evener, MD [facility]

## 2023-07-21 ENCOUNTER — Ambulatory Visit: Payer: PPO | Admitting: Dermatology

## 2023-07-22 ENCOUNTER — Ambulatory Visit: Payer: PPO | Admitting: Dermatology

## 2023-08-06 ENCOUNTER — Ambulatory Visit: Payer: PPO | Admitting: Emergency Medicine

## 2023-08-06 VITALS — Ht 74.0 in | Wt 235.0 lb

## 2023-08-06 DIAGNOSIS — Z0000 Encounter for general adult medical examination without abnormal findings: Secondary | ICD-10-CM

## 2023-08-06 NOTE — Patient Instructions (Addendum)
 Mr. Boomer , Thank you for taking time to come for your Medicare Wellness Visit. I appreciate your ongoing commitment to your health goals. Please review the following plan we discussed and let me know if I can assist you in the future.   Referrals/Orders/Follow-Ups/Clinician Recommendations:   You have an appointment with Dr. Neita Garnet on 08/14/23 @ 3:40pm (arrive 15 min early).  Recommend that you get an eye exam with Dr. Dellie Burns @ [facility]. His office number is  [phone].  Recommend calling urology @ [phone] to schedule an appointment since you have missed the last several ones.  This is a list of the screening recommended for you and due dates:  Health Maintenance  Topic Date Due   Complete foot exam   Never done   Eye exam for diabetics  04/26/2020   Hemoglobin A1C  12/04/2022   COVID-19 Vaccine (4 - 2024-25 season) 01/26/2023   Medicare Annual Wellness Visit  08/05/2024   DTaP/Tdap/Td vaccine (5 - Td or Tdap) 08/30/2032   Pneumonia Vaccine  Completed   Flu Shot  Completed   Zoster (Shingles) Vaccine  Completed   HPV Vaccine  Aged Out    Advanced directives: (In Chart) A copy of your advanced directives are scanned into your chart should your provider ever need it.  Next Medicare Annual Wellness Visit scheduled for next year: Yes, 08/11/24 @ 3:10pm (phone visit)  Fall Prevention in the Home, Adult Falls can cause injuries and affect people of all ages. There are many simple things that you can do to make your home safe and to help prevent falls. If you need it, ask for help making these changes. What actions can I take to prevent falls? General information Use good lighting in all rooms. Make sure to: Replace any light bulbs that burn out. Turn on lights if it is dark and use night-lights. Keep items that you use often in easy-to-reach places. Lower the shelves around your home if needed. Move furniture so that there are clear paths around it. Do not  keep throw rugs or other things on the floor that can make you trip. If any of your floors are uneven, fix them. Add color or contrast paint or tape to clearly mark and help you see: Grab bars or handrails. First and last steps of staircases. Where the edge of each step is. If you use a ladder or stepladder: Make sure that it is fully opened. Do not climb a closed ladder. Make sure the sides of the ladder are locked in place. Have someone hold the ladder while you use it. Know where your pets are as you move through your home. What can I do in the bathroom?     Keep the floor dry. Clean up any water that is on the floor right away. Remove soap buildup in the bathtub or shower. Buildup makes bathtubs and showers slippery. Use non-skid mats or decals on the floor of the bathtub or shower. Attach bath mats securely with double-sided, non-slip rug tape. If you need to sit down while you are in the shower, use a non-slip stool. Install grab bars by the toilet and in the bathtub and shower. Do not use towel bars as grab bars. What can I do in the bedroom? Make sure that you have a light by your bed that is easy to reach. Do not use any sheets or blankets on your bed that hang to the floor. Have a firm bench or chair with side  arms that you can use for support when you get dressed. What can I do in the kitchen? Clean up any spills right away. If you need to reach something above you, use a sturdy step stool that has a grab bar. Keep electrical cables out of the way. Do not use floor polish or wax that makes floors slippery. What can I do with my stairs? Do not leave anything on the stairs. Make sure that you have a light switch at the top and the bottom of the stairs. Have them installed if you do not have them. Make sure that there are handrails on both sides of the stairs. Fix handrails that are broken or loose. Make sure that handrails are as long as the staircases. Install non-slip  stair treads on all stairs in your home if they do not have carpet. Avoid having throw rugs at the top or bottom of stairs, or secure the rugs with carpet tape to prevent them from moving. Choose a carpet design that does not hide the edge of steps on the stairs. Make sure that carpet is firmly attached to the stairs. Fix any carpet that is loose or worn. What can I do on the outside of my home? Use bright outdoor lighting. Repair the edges of walkways and driveways and fix any cracks. Clear paths of anything that can make you trip, such as tools or rocks. Add color or contrast paint or tape to clearly mark and help you see high doorway thresholds. Trim any bushes or trees on the main path into your home. Check that handrails are securely fastened and in good repair. Both sides of all steps should have handrails. Install guardrails along the edges of any raised decks or porches. Have leaves, snow, and ice cleared regularly. Use sand, salt, or ice melt on walkways during winter months if you live where there is ice and snow. In the garage, clean up any spills right away, including grease or oil spills. What other actions can I take? Review your medicines with your health care provider. Some medicines can make you confused or feel dizzy. This can increase your chance of falling. Wear closed-toe shoes that fit well and support your feet. Wear shoes that have rubber soles and low heels. Use a cane, walker, scooter, or crutches that help you move around if needed. Talk with your provider about other ways that you can decrease your risk of falls. This may include seeing a physical therapist to learn to do exercises to improve movement and strength. Where to find more information Centers for Disease Control and Prevention, [employer]: TonerPromos.no [employer] on Aging: BaseRingTones.pl National Institute on Aging: BaseRingTones.pl Contact a health care provider if: You are afraid of falling at home. You feel  weak, drowsy, or dizzy at home. You fall at home. Get help right away if you: Lose consciousness or have trouble moving after a fall. Have a fall that causes a head injury. These symptoms may be an emergency. Get help right away. Call 911. Do not wait to see if the symptoms will go away. Do not drive yourself to the hospital. This information is not intended to replace advice given to you by your health care provider. Make sure you discuss any questions you have with your health care provider. Document Revised: 01/14/2022 Document Reviewed: 01/14/2022 Elsevier Patient Education  2024 [employer].

## 2023-08-06 NOTE — Progress Notes (Signed)
 Subjective:   ZEALAND BOYETT is a 89 y.o. who presents for a Medicare Wellness preventive visit.  Visit Complete: Virtual I connected with  Fayrene Fearing A Ciancio on 08/06/23 by a audio enabled telemedicine application and verified that I am speaking with the correct person using two identifiers.  Patient Location: Home  Provider Location: Home Office  I discussed the limitations of evaluation and management by telemedicine. The patient expressed understanding and agreed to proceed.  Vital Signs: Because this visit was a virtual/telehealth visit, some criteria may be missing or patient reported. Any vitals not documented were not able to be obtained and vitals that have been documented are patient reported.  VideoDeclined- This patient declined Librarian, academic. Therefore the visit was completed with audio only.  AWV Questionnaire: No: Patient Medicare AWV questionnaire was not completed prior to this visit.  Cardiac Risk Factors include: advanced age (>68men, >49 women);male gender;hypertension;dyslipidemia;obesity (BMI >30kg/m2);Other (see comment), Risk factor comments: prediabetic, CAD, OSA (no cpap)     Objective:    Today's Vitals   08/06/23 1504 08/06/23 1505  Weight: 235 lb (106.6 kg)   Height: 6\' 2"  (1.88 m)   PainSc:  4    Body mass index is 30.17 kg/m.     08/06/2023    3:19 PM 02/23/2023    3:22 PM 12/25/2022    8:56 PM 11/18/2022    5:03 PM 11/17/2022    4:01 PM 06/06/2022    1:54 PM 06/05/2021    3:10 PM  Advanced Directives  Does Patient Have a Medical Advance Directive? Yes Yes Yes No No Yes Yes  Type of Estate agent of [place];Living will  Healthcare Power of [place];Living will   Healthcare Power of [place];Living will Healthcare Power of [place];Living will  Does patient want to make changes to medical advance directive? No - Patient declined     No - Patient declined Yes (Inpatient - patient defers changing  a medical advance directive and declines information at this time)  Copy of Healthcare Power of Attorney in Chart? Yes - validated most recent copy scanned in chart (See row information)     Yes - validated most recent copy scanned in chart (See row information) Yes - validated most recent copy scanned in chart (See row information)  Would patient like information on creating a medical advance directive?     No - Patient declined      Current Medications (verified) Outpatient Encounter Medications as of 08/06/2023  Medication Sig   apixaban (ELIQUIS) 5 MG TABS tablet Take 1 tablet (5 mg total) by mouth 2 (two) times daily.   cyanocobalamin (VITAMIN B12) 1000 MCG tablet Take 1 tablet (1,000 mcg total) by mouth daily.   doxazosin (CARDURA) 8 MG tablet TAKE 1 TABLET BY MOUTH ONCE DAILY   furosemide (LASIX) 40 MG tablet TAKE 1 TABLET BY MOUTH ONCE DAILY (DOSE CHANGE)   levothyroxine (SYNTHROID) 88 MCG tablet TAKE 1 TABLET BY MOUTH ONCE DAILY IN THE MORNING BEFORE BREAKFAST   psyllium (METAMUCIL) 58.6 % packet Take 1 packet by mouth daily.   ramipril (ALTACE) 5 MG capsule TAKE 1 CAPSULE BY MOUTH EVERY DAY   rosuvastatin (CRESTOR) 10 MG tablet Take 1 tablet by mouth once daily   topiramate (TOPAMAX) 50 MG tablet Take 1 tablet by mouth twice daily   No facility-administered encounter medications on file as of 08/06/2023.    Allergies (verified) Dexilant [dexlansoprazole] and Primidone   History: Past Medical History:  Diagnosis Date   Actinic keratosis    Anginal pain (HCC)    Aortic atherosclerosis (HCC)    Arthritis    Atrial fibrillation (HCC)    Basal cell carcinoma 02/06/2010   Left shoulder. Superficial.   Basal cell carcinoma 10/11/2013   Right medial forearm. Superficial   Basal cell carcinoma 04/10/2015   Right inf. lat. thigh. Superficial   Basal cell carcinoma 12/09/2016   Right cheek. Superficial.   Chronic airway obstruction, not elsewhere classified    Colon polyps     Complication of anesthesia    SPINAL DID NOT WORK FOR LAST KNEE REPLACEMENT AND HAD TO HAVE GA   Coronary atherosclerosis of native coronary artery    nonobstructive   Diabetes mellitus without complication (HCC)    Essential hypertension, benign    GERD (gastroesophageal reflux disease)    H/O   Glucose intolerance (impaired glucose tolerance)    Heart murmur    History of kidney stones    Hyperlipidemia    Hypothyroidism    Iron deficiency anemia    Knee pain, right    LBBB (left bundle branch block)    Occlusion and stenosis of carotid artery without mention of cerebral infarction    wears compression stockings   Other dyspnea and respiratory abnormality    w/ pseuodowheeze resolves with purse lip manuever   Precordial pain    Shoulder pain, right    Squamous cell carcinoma of skin 10/06/2007   Right forearm. SCCis   Squamous cell carcinoma of skin 10/11/2013   Left mid lat. pretibial. KA-like pattern.   Tremor    Unspecified sleep apnea    HAS NOT USED CPAP IN 20 YEARS   Venous insufficiency    Wears dentures    full upper   Wound cellulitis- right lower leg anterior  10/29/2019   Past Surgical History:  Procedure Laterality Date   CARDIAC CATHETERIZATION     X2   COLONOSCOPY  03/17/12   Dr Byrnett-diverticulosis   ESOPHAGOGASTRODUODENOSCOPY (EGD) WITH PROPOFOL N/A 11/25/2014   Procedure: ESOPHAGOGASTRODUODENOSCOPY (EGD) WITH PROPOFOL;  Surgeon: Midge Minium, MD;  Location: [facility];  Service: Endoscopy;  Laterality: N/A;  with biopsy   ESOPHAGOGASTRODUODENOSCOPY (EGD) WITH PROPOFOL N/A 01/05/2015   Procedure: ESOPHAGOGASTRODUODENOSCOPY (EGD) WITH PROPOFOL;  Surgeon: Midge Minium, MD;  Location: [facility];  Service: Endoscopy;  Laterality: N/A;   EYE SURGERY Bilateral    cataract    KNEE ARTHROPLASTY Right 09/06/2020   Procedure: COMPUTER ASSISTED TOTAL KNEE ARTHROPLASTY;  Surgeon: Donato Heinz, MD;  Location: [facility];  Service: Orthopedics;   Laterality: Right;   TOTAL KNEE ARTHROPLASTY Left 2012   Family History  Problem Relation Age of Onset   Heart failure Mother 53       congestive   Heart attack Father 35   Alcohol abuse Father    Alzheimer's disease Brother 62   Alzheimer's disease Sister 1   Colon cancer Neg Hx    Liver disease Neg Hx    Social History   Socioeconomic History   Marital status: Married    Spouse name: [name]   Number of children: 2   Years of education: Not on file   Highest education level: Bachelor's degree (e.g., BA, AB, BS)  Occupational History   Occupation: retired    Comment: [employer]  Tobacco Use   Smoking status: Former    Current packs/day: 0.00    Average packs/day: 2.0 packs/day for 31.0 years (62.0 ttl  pk-yrs)    Types: Cigarettes    Start date: 12/24/1949    Quit date: 12/24/1980    Years since quitting: 42.6   Smokeless tobacco: Never   Tobacco comments:    quit in 1982  Vaping Use   Vaping status: Never Used  Substance and Sexual Activity   Alcohol use: No    Alcohol/week: 0.0 standard drinks of alcohol   Drug use: No   Sexual activity: Not on file  Other Topics Concern   Not on file  Social History Narrative   Lives with wife, retired Social worker, 2 children, 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 (1 stepchild is deceased)   Social Drivers of Corporate investment banker Strain: Low Risk  (08/06/2023)   Overall Financial Resource Strain (CARDIA)    Difficulty of Paying Living Expenses: Not hard at all  Food Insecurity: No Food Insecurity (08/06/2023)   Hunger Vital Sign    Worried About Running Out of Food in the Last Year: Never true    Ran Out of Food in the Last Year: Never true  Transportation Needs: No Transportation Needs (08/06/2023)   PRAPARE - Administrator, Civil Service (Medical): No    Lack of Transportation (Non-Medical): No  Physical Activity: Inactive (08/06/2023)   Exercise Vital Sign    Days of Exercise per Week: 0 days     Minutes of Exercise per Session: 0 min  Stress: No Stress Concern Present (08/06/2023)   [employer] of Occupational Health - Occupational Stress Questionnaire    Feeling of Stress : Not at all  Social Connections: Socially Integrated (08/06/2023)   Social Connection and Isolation Panel [NHANES]    Frequency of Communication with Friends and Family: More than three times a week    Frequency of Social Gatherings with Friends and Family: More than three times a week    Attends Religious Services: More than 4 times per year    Active Member of [employer] or Organizations: Yes    Attends Banker Meetings: Never    Marital Status: Married    Tobacco Counseling Counseling given: Not Answered Tobacco comments: quit in 1982    Clinical Intake:  Pre-visit preparation completed: Yes  Pain : 0-10 Pain Score: 4  Pain Type: Chronic pain, Acute pain Pain Location: Shoulder Pain Orientation: Right Pain Descriptors / Indicators: Aching     BMI - recorded: 30.17 Nutritional Status: BMI > 30  Obese Nutritional Risks: None Diabetes: No  How often do you need to have someone help you when you read instructions, pamphlets, or other written materials from your doctor or pharmacy?: 1 - Never  Interpreter Needed?: No  Information entered by :: Tora Kindred, CMA   Activities of Daily Living     08/06/2023    3:08 PM 09/12/2022    3:08 PM  In your present state of health, do you have any difficulty performing the following activities:  Hearing? 0 0  Vision? 0 0  Difficulty concentrating or making decisions? 0 0  Walking or climbing stairs? 1 0  Comment uses cane or standard walker   Dressing or bathing? 0 0  Doing errands, shopping? 0 0  Preparing Food and eating ? Y   Comment wife does all cooking   Using the Toilet? N   In the past six months, have you accidently leaked urine? N   Do you have problems with loss of bowel control? N   Managing your Medications? N    Managing your  Finances? N   Housekeeping or managing your Housekeeping? Y   Comment wife does all housekeeping     Patient Care Team: Ronnald Ramp, MD as PCP - General (Family Medicine) [place], Peter M, MD as PCP - Cardiology (Cardiology) Dingeldein, Viviann Spare, MD as Consulting Physician (Ophthalmology) Deirdre Evener, MD (Dermatology) Ernest Pine Illene Labrador, MD (Orthopedic Surgery) Rosetta Posner, DPM as Consulting Physician (Podiatry)  Indicate any recent Medical Services you may have received from other than [facility] providers in the past year (date may be approximate).     Assessment:   This is a routine wellness examination for Abdullahi.  Hearing/Vision screen Hearing Screening - Comments:: Denies hearing loss Vision Screening - Comments:: Recommended getting eye exam   Goals Addressed             This Visit's Progress    Patient Stated       Start to exercise       Depression Screen     08/06/2023    3:17 PM 10/08/2022    2:10 PM 09/12/2022    3:07 PM 07/17/2022    3:02 PM 06/06/2022    1:51 PM 06/05/2021    3:08 PM 05/31/2020    3:00 PM  PHQ 2/9 Scores  PHQ - 2 Score 0 0 0 0 0 0 0  PHQ- 9 Score 0  0 0       Fall Risk     08/06/2023    3:20 PM 11/25/2022    9:26 AM 09/12/2022    3:07 PM 07/17/2022    3:02 PM 06/12/2022    2:26 PM  Fall Risk   Falls in the past year? 1 1 0 0 0  Number falls in past yr: 0 1 0 0 0  Injury with Fall? 1 1 0 0 0  Comment  left knee and arm     Risk for fall due to : History of fall(s);Impaired balance/gait;Orthopedic patient  No Fall Risks  No Fall Risks  Follow up Falls prevention discussed;Falls evaluation completed;Education provided        MEDICARE RISK AT HOME:  Medicare Risk at Home Any stairs in or around the home?: Yes If so, are there any without handrails?: No Home free of loose throw rugs in walkways, pet beds, electrical cords, etc?: Yes Adequate lighting in your home to reduce risk of falls?: Yes Life alert?:  No Use of a cane, walker or w/c?: Yes (cane or standard walker) Grab bars in the bathroom?: Yes Shower chair or bench in shower?: Yes Elevated toilet seat or a handicapped toilet?: Yes  TIMED UP AND GO:  Was the test performed?  No  Cognitive Function: 6CIT completed        08/06/2023    3:22 PM 06/06/2022    1:55 PM 05/24/2019   10:55 AM 05/18/2018   11:02 AM 05/15/2017    1:30 PM  6CIT Screen  What Year? 0 points 0 points 0 points 0 points 0 points  What month? 0 points 0 points 0 points  0 points  What time? 0 points 0 points 0 points 0 points 0 points  Count back from 20 0 points 0 points 0 points 0 points 0 points  Months in reverse 0 points 0 points 0 points 0 points 0 points  Repeat phrase 0 points 0 points 0 points 0 points 0 points  Total Score 0 points 0 points 0 points  0 points    Immunizations Immunization History  Administered Date(s)  Administered   Fluad Quad(high Dose 65+) 02/24/2019, 02/14/2020, 03/28/2021, 03/20/2022   Fluad Trivalent(High Dose 65+) 02/06/2023   Influenza Split 02/20/2011, 02/27/2012   Influenza, High Dose Seasonal PF 02/22/2014, 02/18/2015, 02/29/2016, 02/26/2017, 04/22/2018   Influenza,inj,Quad PF,6+ Mos 02/13/2013   Influenza-Unspecified 02/24/2013   PFIZER(Purple Top)SARS-COV-2 Vaccination 06/17/2019, 07/08/2019, 04/26/2020   Pneumococcal Conjugate-13 04/14/2014   Pneumococcal Polysaccharide-23 03/25/1997, 04/05/2003   Td 08/29/2003   Tdap 02/20/2011, 02/14/2020, 08/31/2022   Zoster Recombinant(Shingrix) 02/26/2017, 06/25/2017   Zoster, Live 05/12/2007    Screening Tests Health Maintenance  Topic Date Due   FOOT EXAM  Never done   OPHTHALMOLOGY EXAM  04/26/2020   HEMOGLOBIN A1C  12/04/2022   COVID-19 Vaccine (4 - 2024-25 season) 01/26/2023   Medicare Annual Wellness (AWV)  08/05/2024   DTaP/Tdap/Td (5 - Td or Tdap) 08/30/2032   Pneumonia Vaccine 24+ Years old  Completed   INFLUENZA VACCINE  Completed   Zoster Vaccines-  Shingrix  Completed   HPV VACCINES  Aged Out    Health Maintenance  Health Maintenance Due  Topic Date Due   FOOT EXAM  Never done   OPHTHALMOLOGY EXAM  04/26/2020   HEMOGLOBIN A1C  12/04/2022   COVID-19 Vaccine (4 - 2024-25 season) 01/26/2023   Health Maintenance Items Addressed: See Nurse Notes  Additional Screening:  Vision Screening: Recommended annual ophthalmology exams for early detection of glaucoma and other disorders of the eye.  Dental Screening: Recommended annual dental exams for proper oral hygiene  Community Resource Referral / Chronic Care Management: CRR required this visit?  No   CCM required this visit?  No     Plan:     I have personally reviewed and noted the following in the patient's chart:   Medical and social history Use of alcohol, tobacco or illicit drugs  Current medications and supplements including opioid prescriptions. Patient is not currently taking opioid prescriptions. Functional ability and status Nutritional status Physical activity Advanced directives List of other physicians Hospitalizations, surgeries, and ER visits in previous 12 months Vitals Screenings to include cognitive, depression, and falls Referrals and appointments  In addition, I have reviewed and discussed with patient certain preventive protocols, quality metrics, and best practice recommendations. A written personalized care plan for preventive services as well as general preventive health recommendations were provided to patient.     Tora Kindred, CMA   08/06/2023   After Visit Summary: (MyChart) Due to this being a telephonic visit, the after visit summary with patients personalized plan was offered to patient via MyChart   Notes:  Recommended patient get an eye exam. Recommended patient get an appt with urology for follow up Declined covid vaccine at this time

## 2023-08-08 ENCOUNTER — Other Ambulatory Visit: Payer: Self-pay | Admitting: Family Medicine

## 2023-08-14 ENCOUNTER — Ambulatory Visit: Payer: PPO | Admitting: Family Medicine

## 2023-08-14 ENCOUNTER — Ambulatory Visit: Admitting: Dermatology

## 2023-08-18 ENCOUNTER — Ambulatory Visit: Admitting: Dermatology

## 2023-08-18 ENCOUNTER — Encounter: Payer: Self-pay | Admitting: Dermatology

## 2023-08-18 DIAGNOSIS — D492 Neoplasm of unspecified behavior of bone, soft tissue, and skin: Secondary | ICD-10-CM | POA: Diagnosis not present

## 2023-08-18 DIAGNOSIS — L209 Atopic dermatitis, unspecified: Secondary | ICD-10-CM | POA: Diagnosis not present

## 2023-08-18 DIAGNOSIS — C44319 Basal cell carcinoma of skin of other parts of face: Secondary | ICD-10-CM

## 2023-08-18 DIAGNOSIS — C44311 Basal cell carcinoma of skin of nose: Secondary | ICD-10-CM | POA: Diagnosis not present

## 2023-08-18 MED ORDER — CLOBETASOL PROPIONATE 0.05 % EX CREA: TOPICAL_CREAM | CUTANEOUS | 2 refills | Status: DC

## 2023-08-18 NOTE — Patient Instructions (Addendum)
 Start Clobetasol cream twice a day until smooth. Avoid applying to face, groin, and axilla. Use as directed. Long-term use can cause thinning of the skin.  Topical steroids (such as triamcinolone, fluocinolone, fluocinonide, mometasone, clobetasol, halobetasol, betamethasone, hydrocortisone) can cause thinning and lightening of the skin if they are used for too long in the same area. Your physician has selected the right strength medicine for your problem and area affected on the body. Please use your medication only as directed by your physician to prevent side effects.       Wound Care Instructions  Cleanse wound gently with soap and water once a day then pat dry with clean gauze. Apply a thin coat of Petrolatum (petroleum jelly, "Vaseline") over the wound (unless you have an allergy to this). We recommend that you use a new, sterile tube of Vaseline. Do not pick or remove scabs. Do not remove the yellow or white "healing tissue" from the base of the wound.  Cover the wound with fresh, clean, nonstick gauze and secure with paper tape. You may use Band-Aids in place of gauze and tape if the wound is small enough, but would recommend trimming much of the tape off as there is often too much. Sometimes Band-Aids can irritate the skin.  You should call the office for your biopsy report after 1 week if you have not already been contacted.  If you experience any problems, such as abnormal amounts of bleeding, swelling, significant bruising, significant pain, or evidence of infection, please call the office immediately.  FOR ADULT SURGERY PATIENTS: If you need something for pain relief you may take 1 extra strength Tylenol (acetaminophen) AND 2 Ibuprofen (200mg  each) together every 4 hours as needed for pain. (do not take these if you are allergic to them or if you have a reason you should not take them.) Typically, you may only need pain medication for 1 to 3 days.    Gentle Skin Care Guide  1.  Bathe no more than once a day.  2. Avoid bathing in hot water  3. Use a mild soap like Dove, Vanicream, Cetaphil, CeraVe. Can use Lever 2000 or Cetaphil antibacterial soap  4. Use soap only where you need it. On most days, use it under your arms, between your legs, and on your feet. Let the water rinse other areas unless visibly dirty.  5. When you get out of the bath/shower, use a towel to gently blot your skin dry, don't rub it.  6. While your skin is still a little damp, apply a moisturizing cream such as Vanicream, CeraVe, Cetaphil, Eucerin, Sarna lotion or plain Vaseline Jelly. For hands apply Neutrogena [place] Hand Cream or Excipial Hand Cream.  7. Reapply moisturizer any time you start to itch or feel dry.  8. Sometimes using free and clear laundry detergents can be helpful. Fabric softener sheets should be avoided. Downy Free & Gentle liquid, or any liquid fabric softener that is free of dyes and perfumes, it acceptable to use  9. If your doctor has given you prescription creams you may apply moisturizers over them       Due to recent changes in healthcare laws, you may see results of your pathology and/or laboratory studies on MyChart before the doctors have had a chance to review them. We understand that in some cases there may be results that are confusing or concerning to you. Please understand that not all results are received at the same time and often the doctors may  need to interpret multiple results in order to provide you with the best plan of care or course of treatment. Therefore, we ask that you please give [place] 2 business days to thoroughly review all your results before contacting the office for clarification. Should we see a critical lab result, you will be contacted sooner.   If You Need Anything After Your Visit  If you have any questions or concerns for your doctor, please call our main line at [phone] and press option 4 to reach your doctor's medical  assistant. If no one answers, please leave a voicemail as directed and we will return your call as soon as possible. Messages left after 4 pm will be answered the following business day.   You may also send [place] a message via MyChart. We typically respond to MyChart messages within 1-2 business days.  For prescription refills, please ask your pharmacy to contact our office. Our fax number is [phone].  If you have an urgent issue when the clinic is closed that cannot wait until the next business day, you can page your doctor at the number below.    Please note that while we do our best to be available for urgent issues outside of office hours, we are not available 24/7.   If you have an urgent issue and are unable to reach [place], you may choose to seek medical care at your doctor's office, retail clinic, urgent care center, or emergency room.  If you have a medical emergency, please immediately call 911 or go to the emergency department.  Pager Numbers  - Dr. Gwen Pounds: [phone]  - Dr. Roseanne Reno: [phone]  - Dr. Katrinka Blazing: [phone]   In the event of inclement weather, please call our main line at [phone] for an update on the status of any delays or closures.  Dermatology Medication Tips: Please keep the boxes that topical medications come in in order to help keep track of the instructions about where and how to use these. Pharmacies typically print the medication instructions only on the boxes and not directly on the medication tubes.   If your medication is too expensive, please contact our office at [phone] option 4 or send [place] a message through MyChart.   We are unable to tell what your co-pay for medications will be in advance as this is different depending on your insurance coverage. However, we may be able to find a substitute medication at lower cost or fill out paperwork to get insurance to cover a needed medication.   If a prior authorization is required to get  your medication covered by your insurance company, please allow [place] 1-2 business days to complete this process.  Drug prices often vary depending on where the prescription is filled and some pharmacies may offer cheaper prices.  The website www.goodrx.com contains coupons for medications through different pharmacies. The prices here do not account for what the cost may be with help from insurance (it may be cheaper with your insurance), but the website can give you the price if you did not use any insurance.  - You can print the associated coupon and take it with your prescription to the pharmacy.  - You may also stop by our office during regular business hours and pick up a GoodRx coupon card.  - If you need your prescription sent electronically to a different pharmacy, notify our office through [facility] or by phone at [phone] option 4.     Si Usted Necesita Algo Despus  de Su Visita  Tambin puede enviarnos un mensaje a travs de Clinical cytogeneticist. Por lo general respondemos a los mensajes de MyChart en el transcurso de 1 a 2 das hbiles.  Para renovar recetas, por favor pida a su farmacia que se ponga en contacto con nuestra oficina. Annie Sable de fax es [place] [phone].  Si tiene un asunto urgente cuando la clnica est cerrada y que no puede esperar hasta el siguiente da hbil, puede llamar/localizar a su doctor(a) al nmero que aparece a continuacin.   Por favor, tenga en cuenta que aunque hacemos todo lo posible para estar disponibles para asuntos urgentes fuera del horario de [place], no estamos disponibles las 24 horas del da, los 7 [address]  [address] de la [place].   Si tiene un problema urgente y no puede comunicarse con nosotros, puede optar por buscar atencin mdica  en el consultorio de su doctor(a), en una clnica privada, en un centro de atencin urgente o en una sala de emergencias.  Si tiene Engineer, drilling, por favor llame inmediatamente al 911 o vaya a la sala de  emergencias.  Nmeros de bper  - Dr. Gwen Pounds: [phone]  - Dra. Roseanne Reno: [phone]  - Dr. Katrinka Blazing: [phone]   En caso de inclemencias del tiempo, por favor llame a Lacy Duverney principal al [phone] para una actualizacin sobre el [place] de cualquier retraso o cierre.  Consejos para la medicacin en dermatologa: Por favor, guarde las cajas en las que vienen los medicamentos de uso tpico para ayudarle a seguir las instrucciones sobre dnde y cmo usarlos. Las farmacias generalmente imprimen las instrucciones del medicamento slo en las cajas y no directamente en los tubos del [place].   Si su medicamento es muy caro, por favor, pngase en contacto con Rolm Gala llamando al [phone] y presione la opcin 4 o envenos un mensaje a travs de Clinical cytogeneticist.   No podemos decirle cul ser su copago por los medicamentos por adelantado ya que esto es diferente dependiendo de la cobertura de su seguro. Sin embargo, es posible que podamos encontrar un medicamento sustituto a Audiological scientist un formulario para que el seguro cubra el medicamento que se considera necesario.   Si se requiere una autorizacin previa para que su compaa de seguros [place] su medicamento, por favor permtanos de 1 a 2 das hbiles para completar [address]  Los precios de los medicamentos varan con frecuencia dependiendo del Environmental consultant de dnde se surte la receta y alguna farmacias pueden ofrecer precios ms baratos.  El sitio web www.goodrx.com tiene cupones para medicamentos de Health and safety inspector. Los precios aqu no tienen en cuenta lo que podra costar con la ayuda del seguro (puede ser ms barato con su seguro), pero el sitio web puede darle el precio si no utiliz Tourist information centre manager.  - Puede imprimir el cupn correspondiente y llevarlo con su receta a la farmacia.  - Tambin puede pasar por nuestra oficina durante el horario de atencin regular y Education officer, museum una tarjeta de cupones de GoodRx.  - Si  necesita que su receta se enve electrnicamente a una farmacia diferente, informe a nuestra oficina a travs de MyChart de [place] o por telfono llamando al [phone] y presione la opcin 4.

## 2023-08-18 NOTE — Progress Notes (Signed)
 Follow-Up Visit   Subjective  Jared Rice is a 89 y.o. male who presents for the following: itching. Back. Dur: few weeks. Breaks out. Patient's wife states he has had this several times before. Has used Triamcinolone cream in the past to help. Diagnosed with atopic dermatitis.  Patient's wife states he breaks out like this almost every spring/summer. Denies seasonal allergies.   Wife is with patient and contributes to history.    The following portions of the chart were reviewed this encounter and updated as appropriate: medications, allergies, medical history  Review of Systems:  No other skin or systemic complaints except as noted in HPI or Assessment and Plan.  Objective  Well appearing patient in no apparent distress; mood and affect are within normal limits.  A focused examination was performed of the following areas: Back  Relevant exam findings are noted in the Assessment and Plan.  Right Supratip of Nose 6 mm atrophic pink papule with telangiectasias   Right Medial Cheek 12 mm pink patch with crusting and scale        Assessment & Plan   ATOPIC DERMATITIS, seasonal in winter/spring Exam: Scaly pink papules coalescing to plaques at upper and mid back 5-10% BSA  Chronic and persistent condition with duration or expected duration over one year. Condition is bothersome/symptomatic for patient. Currently flared.   Atopic dermatitis (eczema) is a chronic, relapsing, pruritic condition that can significantly affect quality of life. It is often associated with allergic rhinitis and/or asthma and can require treatment with topical medications, phototherapy, or in severe cases biologic injectable medication (Dupixent; Adbry) or Oral JAK inhibitors.  Treatment Plan: Start Clobetasol cream twice a day until smooth. Avoid applying to face, groin, and axilla. Use as directed. Long-term use can cause thinning of the skin. Dupixent if not improved  Topical steroids  (such as triamcinolone, fluocinolone, fluocinonide, mometasone, clobetasol, halobetasol, betamethasone, hydrocortisone) can cause thinning and lightening of the skin if they are used for too long in the same area. Your physician has selected the right strength medicine for your problem and area affected on the body. Please use your medication only as directed by your physician to prevent side effects.    Recommend gentle skin care.   NEOPLASM OF SKIN (2) Right Supratip of Nose Skin / nail biopsy Type of biopsy: tangential   Informed consent: discussed and consent obtained   Timeout: patient name, date of birth, surgical site, and procedure verified   Procedure prep:  Patient was prepped and draped in usual sterile fashion Prep type:  Isopropyl alcohol Anesthesia: the lesion was anesthetized in a standard fashion   Anesthetic:  1% lidocaine w/ epinephrine 1-100,000 buffered w/ 8.4% NaHCO3 Instrument used: DermaBlade   Hemostasis achieved with: pressure and aluminum chloride   Outcome: patient tolerated procedure well   Post-procedure details: sterile dressing applied and wound care instructions given   Dressing type: bandage and petrolatum   Specimen 1 - Surgical pathology Differential Diagnosis: R/O BCC  Check Margins: No Right Medial Cheek Skin / nail biopsy Type of biopsy: tangential   Informed consent: discussed and consent obtained   Timeout: patient name, date of birth, surgical site, and procedure verified   Procedure prep:  Patient was prepped and draped in usual sterile fashion Prep type:  Isopropyl alcohol Anesthesia: the lesion was anesthetized in a standard fashion   Anesthetic:  1% lidocaine w/ epinephrine 1-100,000 buffered w/ 8.4% NaHCO3 Instrument used: DermaBlade   Hemostasis achieved with: pressure and aluminum  chloride   Outcome: patient tolerated procedure well   Post-procedure details: sterile dressing applied and wound care instructions given   Dressing type:  bandage and petrolatum   Specimen 2 - Surgical pathology Differential Diagnosis: R/O BCC   Check Margins: No Patient agrees to Mohs surgery if indicated ATOPIC DERMATITIS, UNSPECIFIED TYPE    Return in about 1 month (around 09/18/2023) for Rash Follow Up.  I, Lawson Radar, CMA, am acting as scribe for Elie Goody, MD.   Documentation: I have reviewed the above documentation for accuracy and completeness, and I agree with the above.  Elie Goody, MD

## 2023-08-19 ENCOUNTER — Other Ambulatory Visit: Payer: Self-pay | Admitting: Family Medicine

## 2023-08-19 LAB — SURGICAL PATHOLOGY

## 2023-08-19 NOTE — Telephone Encounter (Signed)
 Copied from CRM [id number]. Topic: Clinical - Medication Refill >> Aug 19, 2023  4:07 PM Elle L wrote: Most Recent Primary Care Visit:  Provider: Tora Kindred  Department: [facility]  Visit Type: MEDICARE AWV, SEQUENTIAL  Date: 08/06/2023  Medication: apixaban (ELIQUIS) 5 MG TABS tablet AND doxazosin (CARDURA) 8 MG tablet   Has the patient contacted their pharmacy? Yes  Is this the correct pharmacy for this prescription? Yes  This is the patient's preferred pharmacy:   [id number] - [address] [place] [address] Phone: [phone] Fax: [phone]  Has the prescription been filled recently? No  Is the patient out of the medication? Yes  Has the patient been seen for an appointment in the last year OR does the patient have an upcoming appointment? Yes  Can we respond through MyChart? No  Agent: Please be advised that Rx refills may take up to 3 business days. We ask that you follow-up with your pharmacy.

## 2023-08-20 ENCOUNTER — Telehealth: Payer: Self-pay

## 2023-08-20 DIAGNOSIS — C44310 Basal cell carcinoma of skin of unspecified parts of face: Secondary | ICD-10-CM

## 2023-08-20 NOTE — Telephone Encounter (Signed)
-----   Message from [facility] sent at 08/19/2023  6:07 PM EDT ----- Diagnosis: 1. Skin, right supratip of nose :       BASAL CELL CARCINOMA, NODULAR PATTERN        2. Skin, right medial cheek :       BASAL CELL CARCINOMA, NODULAR AND INFILTRATIVE PATTERNS    Please call with diagnosis and determine where the patient would like to have Mohs surgery.  Explanation: your biopsies show basal cell skin cancers in the second layer of the skin. This is the most common kind of skin cancer and is caused by damage from sun exposure. Basal cell skin cancers almost never spread beyond the skin, so they are not dangerous to your overall health. However, they will continue to grow, can bleed, cause nonhealing wounds, and disrupt nearby structures unless fully treated.  Treatment: Mohs (patient already agreed to proceed with Mohs)

## 2023-08-20 NOTE — Telephone Encounter (Signed)
 Advised pt of bx results and discussed results.  Patient prefers to go to [facility] for mohs.  Referral placed./sh

## 2023-08-21 MED ORDER — DOXAZOSIN MESYLATE 8 MG PO TABS: 8.0000 mg | ORAL_TABLET | Freq: Every day | ORAL | 0 refills | Status: DC

## 2023-08-21 NOTE — Telephone Encounter (Signed)
 Apixaban ordered by cardiologist- refusing refill  Requested Prescriptions  Pending Prescriptions Disp Refills   doxazosin (CARDURA) 8 MG tablet 90 tablet 0    Sig: Take 1 tablet (8 mg total) by mouth daily.     Cardiovascular:  Alpha Blockers Passed - 08/21/2023 10:26 AM      Passed - Last BP in normal range    BP Readings from Last 1 Encounters:  07/02/23 128/76         Passed - Valid encounter within last 6 months    Recent Outpatient Visits   None     Future Appointments             In 4 weeks Elie Goody, MD [facility]            Refused Prescriptions Disp Refills   apixaban (ELIQUIS) 5 MG TABS tablet 180 tablet     Sig: Take 1 tablet (5 mg total) by mouth 2 (two) times daily.     Hematology:  Anticoagulants - apixaban Failed - 08/21/2023 10:26 AM      Failed - PLT in normal range and within 360 days    Platelets  Date Value Ref Range Status  02/23/2023 121 (L) 150 - 400 K/uL Final  06/05/2022 164 150 - 450 x10E3/uL Final         Failed - HGB in normal range and within 360 days    Hemoglobin  Date Value Ref Range Status  02/23/2023 9.9 (L) 13.0 - 17.0 g/dL Final  96/08/5407 81.1 (L) 13.0 - 17.7 g/dL Final         Failed - HCT in normal range and within 360 days    HCT  Date Value Ref Range Status  02/23/2023 31.4 (L) 39.0 - 52.0 % Final   Hematocrit  Date Value Ref Range Status  06/05/2022 38.2 37.5 - 51.0 % Final         Failed - AST in normal range and within 360 days    AST  Date Value Ref Range Status  06/05/2022 6 0 - 40 IU/L Final         Failed - ALT in normal range and within 360 days    ALT  Date Value Ref Range Status  06/05/2022 6 0 - 44 IU/L Final         Passed - Cr in normal range and within 360 days    Creatinine  Date Value Ref Range Status  10/13/2013 0.87 0.60 - 1.30 mg/dL Final   Creatinine, Ser  Date Value Ref Range Status  02/23/2023 1.16 0.61 - 1.24 mg/dL Final         Passed -  Valid encounter within last 12 months    Recent Outpatient Visits   None     Future Appointments             In 4 weeks Elie Goody, MD [facility]

## 2023-08-25 DIAGNOSIS — E538 Deficiency of other specified B group vitamins: Secondary | ICD-10-CM | POA: Diagnosis not present

## 2023-08-25 DIAGNOSIS — G250 Essential tremor: Secondary | ICD-10-CM | POA: Diagnosis not present

## 2023-08-25 DIAGNOSIS — E1142 Type 2 diabetes mellitus with diabetic polyneuropathy: Secondary | ICD-10-CM | POA: Diagnosis not present

## 2023-08-25 DIAGNOSIS — R4189 Other symptoms and signs involving cognitive functions and awareness: Secondary | ICD-10-CM | POA: Diagnosis not present

## 2023-08-25 DIAGNOSIS — E559 Vitamin D deficiency, unspecified: Secondary | ICD-10-CM | POA: Diagnosis not present

## 2023-09-01 ENCOUNTER — Ambulatory Visit (INDEPENDENT_AMBULATORY_CARE_PROVIDER_SITE_OTHER): Admitting: Family Medicine

## 2023-09-01 ENCOUNTER — Encounter: Payer: Self-pay | Admitting: Family Medicine

## 2023-09-01 VITALS — BP 137/82 | HR 69 | Ht 74.0 in | Wt 250.0 lb

## 2023-09-01 DIAGNOSIS — R21 Rash and other nonspecific skin eruption: Secondary | ICD-10-CM

## 2023-09-01 MED ORDER — CLOBETASOL PROPIONATE 0.05 % EX CREA: TOPICAL_CREAM | CUTANEOUS | 2 refills | Status: AC

## 2023-09-01 NOTE — Progress Notes (Signed)
 ACUTE PATIENT VISIT    Patient: Jared Rice   DOB: Sep 23, 1932   89 y.o. Male  MRN: [id number] Visit Date: 09/01/2023  Today's healthcare provider: Ronnald Ramp, MD   PCP: Ronnald Ramp, MD   Chief Complaint  Patient presents with   Rash    Itchy red rash on his back that's been there for about a week     Subjective     HPI     Rash    Additional comments: Itchy red rash on his back that's been there for about a week       Last edited by Thedora Hinders, CMA on 09/01/2023 10:53 AM.       Discussed the use of AI scribe software for clinical note transcription with the patient, who gave verbal consent to proceed.  History of Present Illness          Discussed the use of AI scribe software for clinical note transcription with the patient, who gave verbal consent to proceed.  History of Present Illness Jared Rice "Rosanne Ashing" is an 89 year old male who presents with an itchy rash on his back.  He has had an itchy rash on his upper back for approximately one to two weeks. The rash is described as the worst he has experienced, feeling warm compared to the surrounding skin. It is localized to the upper back and does not extend to the lower back, legs, groin, or shoulders. No recent changes in soaps, detergents, or clothing that could have triggered the rash. He also denies any recent outdoor activities such as cutting grass or trees that might have exposed him to allergens. He recalls having similar breakouts in the past, possibly seasonally, but not as severe as the current episode.  He was previously seen by dermatology last month for a similar condition diagnosed as seasonal atopic dermatitis, characterized by scaly pink papules coalescing into plaques on the upper and mid back.  He was prescribed clobetasol cream to be applied twice daily, with instructions to avoid application on the face, groin, or underarms. He is unsure if he has any of  the cream left but recalls using it for itch and inflammation relief. He denies any recent changes in his medications.       Past Medical History:  Diagnosis Date   Actinic keratosis    Anginal pain (HCC)    Aortic atherosclerosis (HCC)    Arthritis    Atrial fibrillation (HCC)    Basal cell carcinoma 02/06/2010   Left shoulder. Superficial.   Basal cell carcinoma 10/11/2013   Right medial forearm. Superficial   Basal cell carcinoma 04/10/2015   Right inf. lat. thigh. Superficial   Basal cell carcinoma 12/09/2016   Right cheek. Superficial.   Basal cell carcinoma 08/18/2023   Right Medial Cheek, referral for mohs to Dr. Caralyn Guile   Basal cell carcinoma 08/18/2023   Right Supratip of Nose, referral to Dr. Caralyn Guile for mohs   Chronic airway obstruction, not elsewhere classified    Colon polyps    Complication of anesthesia    SPINAL DID NOT WORK FOR LAST KNEE REPLACEMENT AND HAD TO HAVE GA   Coronary atherosclerosis of native coronary artery    nonobstructive   Diabetes mellitus without complication (HCC)    Essential hypertension, benign    GERD (gastroesophageal reflux disease)    H/O   Glucose intolerance (impaired glucose tolerance)    Heart murmur    History of kidney  stones    Hyperlipidemia    Hypothyroidism    Iron deficiency anemia    Knee pain, right    LBBB (left bundle branch block)    Occlusion and stenosis of carotid artery without mention of cerebral infarction    wears compression stockings   Other dyspnea and respiratory abnormality    w/ pseuodowheeze resolves with purse lip manuever   Precordial pain    Shoulder pain, right    Squamous cell carcinoma of skin 10/06/2007   Right forearm. SCCis   Squamous cell carcinoma of skin 10/11/2013   Left mid lat. pretibial. KA-like pattern.   Tremor    Unspecified sleep apnea    HAS NOT USED CPAP IN 20 YEARS   Venous insufficiency    Wears dentures    full upper   Wound cellulitis- right lower leg anterior   10/29/2019    Medications: Outpatient Medications Prior to Visit  Medication Sig   apixaban (ELIQUIS) 5 MG TABS tablet Take 1 tablet (5 mg total) by mouth 2 (two) times daily.   cyanocobalamin (VITAMIN B12) 1000 MCG tablet Take 1 tablet (1,000 mcg total) by mouth daily.   doxazosin (CARDURA) 8 MG tablet Take 1 tablet (8 mg total) by mouth daily.   furosemide (LASIX) 40 MG tablet TAKE 1 TABLET BY MOUTH ONCE DAILY (DOSE CHANGE)   levothyroxine (SYNTHROID) 88 MCG tablet TAKE 1 TABLET BY MOUTH ONCE DAILY IN THE MORNING BEFORE BREAKFAST   psyllium (METAMUCIL) 58.6 % packet Take 1 packet by mouth daily.   ramipril (ALTACE) 5 MG capsule TAKE 1 CAPSULE BY MOUTH EVERY DAY   rosuvastatin (CRESTOR) 10 MG tablet Take 1 tablet by mouth once daily   topiramate (TOPAMAX) 50 MG tablet Take 1 tablet by mouth twice daily   [DISCONTINUED] clobetasol cream (TEMOVATE) 0.05 % Apply twice a day until smooth. Avoid applying to face, groin, and axilla.   No facility-administered medications prior to visit.    Review of Systems  Last CBC Lab Results  Component Value Date   WBC 9.3 02/23/2023   HGB 9.9 (L) 02/23/2023   HCT 31.4 (L) 02/23/2023   MCV 97.8 02/23/2023   MCH 30.8 02/23/2023   RDW 15.1 02/23/2023   PLT 121 (L) 02/23/2023   Last metabolic panel Lab Results  Component Value Date   GLUCOSE 107 (H) 02/23/2023   NA 139 02/23/2023   K 3.8 02/23/2023   CL 108 02/23/2023   CO2 24 02/23/2023   BUN 21 02/23/2023   CREATININE 1.16 02/23/2023   GFRNONAA >60 02/23/2023   CALCIUM 8.0 (L) 02/23/2023   PHOS 3.3 12/25/2016   PROT 5.9 (L) 06/05/2022   ALBUMIN 3.9 06/05/2022   LABGLOB 2.0 06/05/2022   AGRATIO 2.0 06/05/2022   BILITOT 0.4 06/05/2022   ALKPHOS 65 06/05/2022   AST 6 06/05/2022   ALT 6 06/05/2022   ANIONGAP 7 02/23/2023   Last hemoglobin A1c Lab Results  Component Value Date   HGBA1C 5.9 (H) 06/05/2022   Last thyroid functions Lab Results  Component Value Date   TSH 4.640  (H) 06/05/2022        Objective    BP 137/82   Pulse 69   Ht 6\' 2"  (1.88 m)   Wt 250 lb (113.4 kg)   SpO2 95%   BMI 32.10 kg/m  BP Readings from Last 3 Encounters:  09/01/23 137/82  07/02/23 128/76  02/23/23 103/74   Wt Readings from Last 3 Encounters:  09/01/23 250 lb (113.4  kg)  08/06/23 235 lb (106.6 kg)  07/02/23 243 lb 12.8 oz (110.6 kg)       Physical Exam  Physical Exam VITALS: BP- 137/82 SKIN: Rash on upper back with macular erythema, no bleeding nor drainage,  no lesions noted on extremities nor lower back     No results found for any visits on 09/01/23.  Assessment & Plan     Problem List Items Addressed This Visit   None Visit Diagnoses       Rash    -  Primary       Assessment & Plan Atopic Dermatitis Presents with an itchy rash on the upper back for one to two weeks. The rash is warm to touch, localized to the upper back, with no involvement of the legs, groin, or other areas. History of similar breakouts, possibly seasonal, previously diagnosed as seasonal atopic dermatitis. Current presentation is consistent with contact dermatitis rather than shingles. No recent changes in soaps, detergents, or medications. Dermatology (07/2023/) recommended clobetasol and Dupixent if not improved. - Prescribe clobetasol cream 0.05% to be applied twice daily to the affected area on the back for up to two weeks. Avoid application on the face, groin, or underarms due to the risk of skin thinning and color changes. - Instruct to request a refill of clobetasol cream from [facility] [address] if needed. - resubmitted prescription for clobetasol to pharmacy per pt request    Hypertension Blood pressure is well-controlled at 137/82 mmHg. - Continue current medications and monitor blood pressure regularly.       No follow-ups on file.         Ronnald Ramp, MD  [facility] [phone] (phone) [phone] (fax)  [facility]

## 2023-09-10 ENCOUNTER — Ambulatory Visit: Admitting: Dermatology

## 2023-09-10 ENCOUNTER — Encounter: Payer: Self-pay | Admitting: Dermatology

## 2023-09-10 DIAGNOSIS — L209 Atopic dermatitis, unspecified: Secondary | ICD-10-CM

## 2023-09-10 DIAGNOSIS — L2089 Other atopic dermatitis: Secondary | ICD-10-CM

## 2023-09-10 DIAGNOSIS — Z79899 Other long term (current) drug therapy: Secondary | ICD-10-CM

## 2023-09-10 DIAGNOSIS — L820 Inflamed seborrheic keratosis: Secondary | ICD-10-CM

## 2023-09-10 DIAGNOSIS — L299 Pruritus, unspecified: Secondary | ICD-10-CM | POA: Diagnosis not present

## 2023-09-10 DIAGNOSIS — Z7189 Other specified counseling: Secondary | ICD-10-CM | POA: Diagnosis not present

## 2023-09-10 MED ORDER — DUPILUMAB 300 MG/2ML ~~LOC~~ SOSY
600.0000 mg | PREFILLED_SYRINGE | Freq: Once | SUBCUTANEOUS | Status: AC
Start: 2023-09-10 — End: 2023-09-10
  Administered 2023-09-10: 600 mg via SUBCUTANEOUS

## 2023-09-10 NOTE — Progress Notes (Signed)
 Follow-Up Visit   Subjective  Jared Rice is a 89 y.o. male who presents for the following:  patient here concerning itchy back that has been going on for over a week. Some itchy areas at back.   The patient has spots, moles and lesions to be evaluated, some may be new or changing and the patient may have concern these could be cancer.  The following portions of the chart were reviewed this encounter and updated as appropriate: medications, allergies, medical history  Review of Systems:  No other skin or systemic complaints except as noted in HPI or Assessment and Plan.  Objective  Well appearing patient in no apparent distress; mood and affect are within normal limits.    A focused examination was performed of the following areas: Chest and back   Relevant exam findings are noted in the Assessment and Plan.           left shoulder x 1 Erythematous stuck-on, waxy papule or plaque  Assessment & Plan   ATOPIC DERMATITIS Severe with itch.  Patient has failed topical steroids/clobetasol cream. Exam: scaly red patches back and shoulder  15% BSA Chronic and persistent condition with duration or expected duration over one year. Condition is bothersome/symptomatic for patient. Currently flared. Atopic dermatitis (eczema) is a chronic, relapsing, pruritic condition that can significantly affect quality of life. It is often associated with allergic rhinitis and/or asthma and can require treatment with topical medications, phototherapy, or in severe cases biologic injectable medication (Dupixent; Adbry) or Oral JAK inhibitors. Treatment Plan: Restart  Clobetasol cream twice a day until smooth. Avoid applying to face, groin, and axilla. Use as directed. Long-term use can cause thinning of the skin. Dupixent if not improved   Topical steroids (such as triamcinolone, fluocinolone, fluocinonide, mometasone, clobetasol, halobetasol, betamethasone, hydrocortisone) can cause thinning  and lightening of the skin if they are used for too long in the same area. Your physician has selected the right strength medicine for your problem and area affected on the body. Please use your medication only as directed by your physician to prevent side effects.   Will start Dupixent today.  Patient injected in to both arms with Dupixent 300 mg/2 ml. Patient tolerated well with no adverse reactions.   Lot [id number] Exp 12/2024 NDC 9147-8295-62  Will recheck in 2 weeks, plan to give another injection if patient is tolerating well.   Dupilumab (Dupixent) is a treatment given by injection for adults and children with moderate-to-severe atopic dermatitis. Goal is control of skin condition, not cure. It is given as 2 injections at the first dose followed by 1 injection ever 2 weeks thereafter.  Young children are dosed monthly.  Potential side effects include allergic reaction, herpes infections, injection site reactions and conjunctivitis (inflammation of the eyes).  The use of Dupixent requires long term medication management, including periodic office visits.   Continue Clobetasol Cream - apply topically twice daily to affected areas of back and right shoulder for 2 weeks. Then use twice daily to affected areas M-F weekly as needed for rash. Avoid applying to face, groin, and axilla. Use as directed. Long-term use can cause thinning of the skin.  Topical steroids (such as triamcinolone, fluocinolone, fluocinonide, mometasone, clobetasol, halobetasol, betamethasone, hydrocortisone) can cause thinning and lightening of the skin if they are used for too long in the same area. Your physician has selected the right strength medicine for your problem and area affected on the body. Please use your medication only as  directed by your physician to prevent side effects.    Recommend gentle skin care.   OTHER ATOPIC DERMATITIS   Related Medications dupilumab (DUPIXENT) prefilled syringe 600  mg  INFLAMED SEBORRHEIC KERATOSIS left shoulder x 1 Symptomatic, irritating, patient would like treated. Destruction of lesion - left shoulder x 1 Complexity: simple   Destruction method: cryotherapy   Informed consent: discussed and consent obtained   Timeout:  patient name, date of birth, surgical site, and procedure verified Lesion destroyed using liquid nitrogen: Yes   Region frozen until ice ball extended beyond lesion: Yes   Outcome: patient tolerated procedure well with no complications   Post-procedure details: wound care instructions given    Return for 2 week atopic dermatitis follow up and dupixent .  IRandee Busing, CMA, am acting as scribe for Celine Collard, MD.   Documentation: I have reviewed the above documentation for accuracy and completeness, and I agree with the above.  Celine Collard, MD

## 2023-09-10 NOTE — Patient Instructions (Addendum)
 Restart Clobetasol cream - twice daily to rash at back and right shoulder twice daily for 2 weeks. Then after 2 weeks use twice daily Monday thru Friday as needed for itchy rash.  Avoid applying to face, groin, and axilla. Use as directed. Long-term use can cause thinning of the skin.  Topical steroids (such as triamcinolone, fluocinolone, fluocinonide, mometasone, clobetasol, halobetasol, betamethasone, hydrocortisone) can cause thinning and lightening of the skin if they are used for too long in the same area. Your physician has selected the right strength medicine for your problem and area affected on the body. Please use your medication only as directed by your physician to prevent side effects.      Dupilumab (Dupixent) is a treatment given by injection for adults and children with moderate-to-severe atopic dermatitis. Goal is control of skin condition, not cure. It is given as 2 injections at the first dose followed by 1 injection ever 2 weeks thereafter.  Young children are dosed monthly.  Potential side effects include allergic reaction, herpes infections, injection site reactions and conjunctivitis (inflammation of the eyes).  The use of Dupixent requires long term medication management, including periodic office visits.    Due to recent changes in healthcare laws, you may see results of your pathology and/or laboratory studies on MyChart before the doctors have had a chance to review them. We understand that in some cases there may be results that are confusing or concerning to you. Please understand that not all results are received at the same time and often the doctors may need to interpret multiple results in order to provide you with the best plan of care or course of treatment. Therefore, we ask that you please give [place] 2 business days to thoroughly review all your results before contacting the office for clarification. Should we see a critical lab result, you will be contacted  sooner.   If You Need Anything After Your Visit  If you have any questions or concerns for your doctor, please call our main line at [phone] and press option 4 to reach your doctor's medical assistant. If no one answers, please leave a voicemail as directed and we will return your call as soon as possible. Messages left after 4 pm will be answered the following business day.   You may also send [place] a message via MyChart. We typically respond to MyChart messages within 1-2 business days.  For prescription refills, please ask your pharmacy to contact our office. Our fax number is [phone].  If you have an urgent issue when the clinic is closed that cannot wait until the next business day, you can page your doctor at the number below.    Please note that while we do our best to be available for urgent issues outside of office hours, we are not available 24/7.   If you have an urgent issue and are unable to reach [place], you may choose to seek medical care at your doctor's office, retail clinic, urgent care center, or emergency room.  If you have a medical emergency, please immediately call 911 or go to the emergency department.  Pager Numbers  - Dr. Gwen Pounds: [phone]  - Dr. Roseanne Reno: [phone]  - Dr. Katrinka Blazing: [phone]   In the event of inclement weather, please call our main line at [phone] for an update on the status of any delays or closures.  Dermatology Medication Tips: Please keep the boxes that topical medications come in in order to help keep track of the instructions  about where and how to use these. Pharmacies typically print the medication instructions only on the boxes and not directly on the medication tubes.   If your medication is too expensive, please contact our office at [phone] option 4 or send [place] a message through MyChart.   We are unable to tell what your co-pay for medications will be in advance as this is different depending on your  insurance coverage. However, we may be able to find a substitute medication at lower cost or fill out paperwork to get insurance to cover a needed medication.   If a prior authorization is required to get your medication covered by your insurance company, please allow [place] 1-2 business days to complete this process.  Drug prices often vary depending on where the prescription is filled and some pharmacies may offer cheaper prices.  The website www.goodrx.com contains coupons for medications through different pharmacies. The prices here do not account for what the cost may be with help from insurance (it may be cheaper with your insurance), but the website can give you the price if you did not use any insurance.  - You can print the associated coupon and take it with your prescription to the pharmacy.  - You may also stop by our office during regular business hours and pick up a GoodRx coupon card.  - If you need your prescription sent electronically to a different pharmacy, notify our office through [facility] or by phone at [phone] option 4.     Si Usted Necesita Algo Despus de Su Visita  Tambin puede enviarnos un mensaje a travs de Clinical cytogeneticist. Por lo general respondemos a los mensajes de MyChart en el transcurso de 1 a 2 das hbiles.  Para renovar recetas, por favor pida a su farmacia que se ponga en contacto con nuestra oficina. Annie Sable de fax es [place] [phone].  Si tiene un asunto urgente cuando la clnica est cerrada y que no puede esperar hasta el siguiente da hbil, puede llamar/localizar a su doctor(a) al nmero que aparece a continuacin.   Por favor, tenga en cuenta que aunque hacemos todo lo posible para estar disponibles para asuntos urgentes fuera del horario de [place], no estamos disponibles las 24 horas del da, los 7 [address]  [address] de la [place].   Si tiene un problema urgente y no puede comunicarse con nosotros, puede optar por buscar atencin mdica  en el  consultorio de su doctor(a), en una clnica privada, en un centro de atencin urgente o en una sala de emergencias.  Si tiene Engineer, drilling, por favor llame inmediatamente al 911 o vaya a la sala de emergencias.  Nmeros de bper  - Dr. Gwen Pounds: [phone]  - Dra. Roseanne Reno: [phone]  - Dr. Katrinka Blazing: [phone]   En caso de inclemencias del tiempo, por favor llame a Lacy Duverney principal al [phone] para una actualizacin sobre el [place] de cualquier retraso o cierre.  Consejos para la medicacin en dermatologa: Por favor, guarde las cajas en las que vienen los medicamentos de uso tpico para ayudarle a seguir las instrucciones sobre dnde y cmo usarlos. Las farmacias generalmente imprimen las instrucciones del medicamento slo en las cajas y no directamente en los tubos del [place].   Si su medicamento es muy caro, por favor, pngase en contacto con Rolm Gala llamando al [phone] y presione la opcin 4 o envenos un mensaje a travs de Clinical cytogeneticist.   No podemos decirle cul ser su copago por los medicamentos por adelantado ya  que esto es diferente dependiendo de la cobertura de su seguro. Sin embargo, es posible que podamos encontrar un medicamento sustituto a Audiological scientist un formulario para que el seguro cubra el medicamento que se considera necesario.   Si se requiere una autorizacin previa para que su compaa de seguros [place] su medicamento, por favor permtanos de 1 a 2 das hbiles para completar este proceso.  Los precios de los medicamentos varan con frecuencia dependiendo del Environmental consultant de dnde se surte la receta y alguna farmacias pueden ofrecer precios ms baratos.  El sitio web www.goodrx.com tiene cupones para medicamentos de Health and safety inspector. Los precios aqu no tienen en cuenta lo que podra costar con la ayuda del seguro (puede ser ms barato con su seguro), pero el sitio web puede darle el precio si no utiliz Tourist information centre manager.  -  Puede imprimir el cupn correspondiente y llevarlo con su receta a la farmacia.  - Tambin puede pasar por nuestra oficina durante el horario de atencin regular y Education officer, museum una tarjeta de cupones de GoodRx.  - Si necesita que su receta se enve electrnicamente a una farmacia diferente, informe a nuestra oficina a travs de MyChart de [place] o por telfono llamando al [phone] y presione la opcin 4.

## 2023-09-15 ENCOUNTER — Ambulatory Visit: Admitting: Dermatology

## 2023-09-15 NOTE — Patient Instructions (Addendum)
 Jared Rice

## 2023-09-15 NOTE — Progress Notes (Deleted)
   Follow-Up Visit   Subjective  Jared Rice is a 89 y.o. male who presents for the following: Mohs of right supratip of nose  The following portions of the chart were reviewed this encounter and updated as appropriate: medications, allergies, medical history  Review of Systems:  No other skin or systemic complaints except as noted in HPI or Assessment and Plan.  Objective  Well appearing patient in no apparent distress; mood and affect are within normal limits.  A focused examination was performed of the following areas: Right supratip of nose Relevant physical exam findings are noted in the Assessment and Plan.     Assessment & Plan      No follow-ups on file.  I, Haig Levan, Surg Tech III, am acting as scribe for Deneise Finlay, MD.   Documentation: I have reviewed the above documentation for accuracy and completeness, and I agree with the above.  Deneise Finlay, MD

## 2023-09-18 ENCOUNTER — Ambulatory Visit: Admitting: Dermatology

## 2023-09-23 ENCOUNTER — Encounter: Payer: Self-pay | Admitting: Family Medicine

## 2023-09-23 ENCOUNTER — Telehealth: Payer: Self-pay

## 2023-09-23 ENCOUNTER — Ambulatory Visit (INDEPENDENT_AMBULATORY_CARE_PROVIDER_SITE_OTHER): Admitting: Family Medicine

## 2023-09-23 VITALS — BP 135/78 | HR 63 | Ht 74.4 in | Wt 249.0 lb

## 2023-09-23 DIAGNOSIS — I440 Atrioventricular block, first degree: Secondary | ICD-10-CM | POA: Diagnosis not present

## 2023-09-23 DIAGNOSIS — R251 Tremor, unspecified: Secondary | ICD-10-CM | POA: Diagnosis not present

## 2023-09-23 DIAGNOSIS — E538 Deficiency of other specified B group vitamins: Secondary | ICD-10-CM

## 2023-09-23 DIAGNOSIS — E785 Hyperlipidemia, unspecified: Secondary | ICD-10-CM | POA: Diagnosis not present

## 2023-09-23 DIAGNOSIS — R7989 Other specified abnormal findings of blood chemistry: Secondary | ICD-10-CM

## 2023-09-23 DIAGNOSIS — I4891 Unspecified atrial fibrillation: Secondary | ICD-10-CM | POA: Diagnosis not present

## 2023-09-23 DIAGNOSIS — R413 Other amnesia: Secondary | ICD-10-CM

## 2023-09-23 DIAGNOSIS — I10 Essential (primary) hypertension: Secondary | ICD-10-CM

## 2023-09-23 DIAGNOSIS — E559 Vitamin D deficiency, unspecified: Secondary | ICD-10-CM | POA: Diagnosis not present

## 2023-09-23 DIAGNOSIS — E038 Other specified hypothyroidism: Secondary | ICD-10-CM

## 2023-09-23 NOTE — Progress Notes (Signed)
 Established patient visit   Patient: Jared Rice   DOB: [birth year]   89 y.o. Male  MRN: [id number] Visit Date: 09/23/2023  Today's healthcare provider: Mimi Alt, MD   No chief complaint on file.  Subjective       Discussed the use of AI scribe software for clinical note transcription with the patient, who gave verbal consent to proceed.  History of Present Illness Crash A Flewellen "Josiah Nigh" is an 89 year old male with hypertension, atrial fibrillation, and coronary artery disease who presents for a chronic medication follow-up.  He is currently on a medication regimen that includes Metamucil for stool bulking, vitamin B12 1000 mcg daily, Eliquis  5 mg twice daily for atrial fibrillation, Cardura  (doxazosin ) 8 mg at night for blood pressure, Lasix  20 mg daily, Synthroid  88 mcg daily for hypothyroidism, Altace  5 mg for blood pressure, and Crestor  10 mg for cholesterol. Topiramate  was recently discontinued as per the neurologist's advice. He reports no issues with his current medication regimen, and his blood pressure is well-controlled.  He is taking vitamin B12 orally, and his levels will be checked to determine if adjustments are needed. He is also using clobetasol  cream as prescribed by a dermatologist for a specific area, avoiding application on the face, groin, or armpits due to its potency.  His daughter, Sheryle Donning, is actively involved in his care and has been coordinating with healthcare providers regarding home health services. He has encountered scheduling challenges with the home health agency, particularly in arranging afternoon appointments for initial visits. He is seeking assistance in managing his medications and ensuring compliance with his treatment plan.     Past Medical History:  Diagnosis Date   Actinic keratosis    Anginal pain (HCC)    Aortic atherosclerosis (HCC)    Arthritis    Atrial fibrillation (HCC)    Basal cell carcinoma 02/06/2010    Left shoulder. Superficial.   Basal cell carcinoma 10/11/2013   Right medial forearm. Superficial   Basal cell carcinoma 04/10/2015   Right inf. lat. thigh. Superficial   Basal cell carcinoma 12/09/2016   Right cheek. Superficial.   Basal cell carcinoma 08/18/2023   Right Medial Cheek, referral for mohs to Dr. [facility]   Basal cell carcinoma 08/18/2023   Right Supratip of Nose, referral to Dr. [facility] for mohs   Chronic airway obstruction, not elsewhere classified    Colon polyps    Complication of anesthesia    SPINAL DID NOT WORK FOR LAST KNEE REPLACEMENT AND HAD TO HAVE GA   Coronary atherosclerosis of native coronary artery    nonobstructive   Diabetes mellitus without complication (HCC)    Essential hypertension, benign    GERD (gastroesophageal reflux disease)    H/O   Glucose intolerance (impaired glucose tolerance)    Heart murmur    History of kidney stones    Hyperlipidemia    Hypothyroidism    Iron deficiency anemia    Knee pain, right    LBBB (left bundle branch block)    Occlusion and stenosis of carotid artery without mention of cerebral infarction    wears compression stockings   Other dyspnea and respiratory abnormality    w/ pseuodowheeze resolves with purse lip manuever   Precordial pain    Shoulder pain, right    Squamous cell carcinoma of skin 10/06/2007   Right forearm. SCCis   Squamous cell carcinoma of skin 10/11/2013   Left mid lat. pretibial. KA-like pattern.   Tremor  Unspecified sleep apnea    HAS NOT USED CPAP IN 20 YEARS   Venous insufficiency    Wears dentures    full upper   Wound cellulitis- right lower leg anterior  10/29/2019    Medications: Outpatient Medications Prior to Visit  Medication Sig   apixaban  (ELIQUIS ) 5 MG TABS tablet Take 1 tablet (5 mg total) by mouth 2 (two) times daily.   cyanocobalamin  (VITAMIN B12) 1000 MCG tablet Take 1 tablet (1,000 mcg total) by mouth daily.   doxazosin  (CARDURA ) 8 MG tablet Take 1 tablet (8 mg  total) by mouth daily.   furosemide  (LASIX ) 40 MG tablet TAKE 1 TABLET BY MOUTH ONCE DAILY (DOSE CHANGE)   levothyroxine  (SYNTHROID ) 88 MCG tablet TAKE 1 TABLET BY MOUTH ONCE DAILY IN THE MORNING BEFORE BREAKFAST   psyllium (METAMUCIL) 58.6 % packet Take 1 packet by mouth daily.   ramipril  (ALTACE ) 5 MG capsule TAKE 1 CAPSULE BY MOUTH EVERY DAY   rosuvastatin  (CRESTOR ) 10 MG tablet Take 1 tablet by mouth once daily   [DISCONTINUED] topiramate  (TOPAMAX ) 50 MG tablet Take 1 tablet by mouth twice daily   clobetasol  cream (TEMOVATE ) 0.05 % Apply twice a day until smooth. Avoid applying to face, groin, and axilla. (Patient not taking: Reported on 09/23/2023)   No facility-administered medications prior to visit.    Review of Systems  Last CBC Lab Results  Component Value Date   WBC 5.4 09/23/2023   HGB 11.2 (L) 09/23/2023   HCT 34.5 (L) 09/23/2023   MCV 94 09/23/2023   MCH 30.5 09/23/2023   RDW 13.1 09/23/2023   PLT 138 (L) 09/23/2023   Last metabolic panel Lab Results  Component Value Date   GLUCOSE 78 09/23/2023   NA 141 09/23/2023   K 4.5 09/23/2023   CL 104 09/23/2023   CO2 24 09/23/2023   BUN 19 09/23/2023   CREATININE 1.19 09/23/2023   EGFR 59 (L) 09/23/2023   CALCIUM  8.7 09/23/2023   PHOS 3.3 12/25/2016   PROT 5.9 (L) 06/05/2022   ALBUMIN 3.9 06/05/2022   LABGLOB 2.0 06/05/2022   AGRATIO 2.0 06/05/2022   BILITOT 0.4 06/05/2022   ALKPHOS 65 06/05/2022   AST 6 06/05/2022   ALT 6 06/05/2022   ANIONGAP 7 02/23/2023   Last lipids Lab Results  Component Value Date   CHOL 122 06/05/2022   HDL 44 06/05/2022   LDLCALC 68 06/05/2022   TRIG 39 06/05/2022   CHOLHDL 2.8 06/05/2022   Last hemoglobin A1c Lab Results  Component Value Date   HGBA1C 5.9 (H) 06/05/2022   Last thyroid  functions Lab Results  Component Value Date   TSH 12.500 (H) 09/23/2023   Last vitamin D No results found for: "25OHVITD2", "25OHVITD3", "VD25OH"      Objective    BP 135/78    Pulse 63   Ht 6' 2.4" (1.89 m)   Wt 249 lb (112.9 kg)   SpO2 100%   BMI 31.63 kg/m  BP Readings from Last 3 Encounters:  09/23/23 135/78  09/01/23 137/82  07/02/23 128/76   Wt Readings from Last 3 Encounters:  09/23/23 249 lb (112.9 kg)  09/01/23 250 lb (113.4 kg)  08/06/23 235 lb (106.6 kg)        Physical Exam  General: Alert, no acute distress Cardio: Normal S1 and S2, RRR, no r/m/g Pulm: CTAB, normal work of breathing ABD: soft, abdomen is not distended, there is no tenderness to palpation, normal BS      Results  for orders placed or performed in visit on 09/23/23  [facility]  Result Value Ref Range   Glucose 78 70 - 99 mg/dL   BUN 19 8 - 27 mg/dL   Creatinine, Ser 1.61 0.76 - 1.27 mg/dL   eGFR 59 (L) >09 UE/AVW/0.98   BUN/Creatinine Ratio 16 10 - 24   Sodium 141 134 - 144 mmol/L   Potassium 4.5 3.5 - 5.2 mmol/L   Chloride 104 96 - 106 mmol/L   CO2 24 20 - 29 mmol/L   Calcium  8.7 8.6 - 10.2 mg/dL  JXB+J4N+W2NFAO  Result Value Ref Range   TSH 12.500 (H) 0.450 - 4.500 uIU/mL   T3, Free 2.1 2.0 - 4.4 pg/mL   Free T4 0.98 0.82 - 1.77 ng/dL  Vitamin B12  Result Value Ref Range   Vitamin B-12 284 232 - 1,245 pg/mL  CBC  Result Value Ref Range   WBC 5.4 3.4 - 10.8 x10E3/uL   RBC 3.67 (L) 4.14 - 5.80 x10E6/uL   Hemoglobin 11.2 (L) 13.0 - 17.7 g/dL   Hematocrit 13.0 (L) 86.5 - 51.0 %   MCV 94 79 - 97 fL   MCH 30.5 26.6 - 33.0 pg   MCHC 32.5 31.5 - 35.7 g/dL   RDW 78.4 69.6 - 29.5 %   Platelets 138 (L) 150 - 450 x10E3/uL    Assessment & Plan     Problem List Items Addressed This Visit       Cardiovascular and Mediastinum   HYPERTENSION, BENIGN - Primary   Relevant Orders   BMP8+EGFR (Completed)   AMB Referral [facility] Care Management   First degree AV block   Relevant Orders   AMB Referral [facility] Care Management   Atrial fibrillation (HCC)   Relevant Orders   CBC (Completed)   AMB Referral [facility] Care Management     Endocrine   Hypothyroidism    Relevant Orders   TSH+T4F+T3Free (Completed)     Other   Memory loss, short term   Relevant Orders   AMB Referral [facility] Care Management   HLD (hyperlipidemia)   Has a tremor   Avitaminosis D   Other Visit Diagnoses       B12 deficiency       Relevant Orders   Vitamin B12 (Completed)   CBC (Completed)        Assessment & Plan Atrial Fibrillation Atrial fibrillation is managed with Eliquis  (apixaban ) to prevent thromboembolic events. Heart rate is irregularly irregular but not excessively fast. Eliquis  was recently refilled for a year, indicating stable management. - Continue Eliquis  (apixaban ) 5 mg twice daily.  Coronary Artery Disease (CAD) Coronary artery disease is well-managed with Crestor  (rosuvastatin ) for cholesterol control. - Continue Crestor  (rosuvastatin ) 10 mg daily.  Hypertension Blood pressure is well-controlled with Cardura  (doxazosin ) and Altace  (ramipril ). - Continue Cardura  (doxazosin ) 8 mg at night. - Continue Altace  (ramipril ) 5 mg daily.  Hypothyroidism Hypothyroidism is managed with Synthroid  (levothyroxine ). Thyroid  function tests will be conducted to assess current management. - Continue Synthroid  (levothyroxine ) 88 mcg daily. - Order thyroid  function tests.  Medication Management Reviewed current medication regimen. Topiramate  will be discontinued as per neurologist's recommendation. Vitamin B12 levels will be checked to determine if current supplementation is adequate. - Discontinue topiramate . - Check vitamin B12 levels. - Continue vitamin B12 1000 mcg daily pending lab results. - Order comprehensive lab work including B12, hemoglobin, platelets, thyroid  levels, and metabolic panel.  Home Health Services Home health services were previously ordered by the neurologist but scheduling  conflicts have delayed the initial visit. [facility] will be made to explore additional resources and support. - Submit referral  to the [facility] for additional home health resources. - Follow up with [place] for scheduling of initial home health visit.     Return in about 3 months (around 12/23/2023) for CHRONIC F/U.         Mimi Alt, MD  [facility] [phone] (phone) [phone] (fax)  [facility]

## 2023-09-23 NOTE — Telephone Encounter (Signed)
 Copied from CRM [id number]. Topic: Appointments - Appointment Info/Confirmation >> Sep 23, 2023  8:43 AM Opal Bill wrote: Daughter Lala Pili called and asked that when the pt arrives to his appt today at 140p, if someone can have him call her so she can be on the phone with them for the appt as she is unable to come.

## 2023-09-24 ENCOUNTER — Encounter: Payer: Self-pay | Admitting: Dermatology

## 2023-09-24 ENCOUNTER — Ambulatory Visit: Admitting: Dermatology

## 2023-09-24 ENCOUNTER — Telehealth: Payer: Self-pay

## 2023-09-24 DIAGNOSIS — L209 Atopic dermatitis, unspecified: Secondary | ICD-10-CM | POA: Diagnosis not present

## 2023-09-24 DIAGNOSIS — D492 Neoplasm of unspecified behavior of bone, soft tissue, and skin: Secondary | ICD-10-CM | POA: Diagnosis not present

## 2023-09-24 DIAGNOSIS — L820 Inflamed seborrheic keratosis: Secondary | ICD-10-CM

## 2023-09-24 DIAGNOSIS — Z79899 Other long term (current) drug therapy: Secondary | ICD-10-CM

## 2023-09-24 DIAGNOSIS — L2089 Other atopic dermatitis: Secondary | ICD-10-CM

## 2023-09-24 DIAGNOSIS — L570 Actinic keratosis: Secondary | ICD-10-CM

## 2023-09-24 DIAGNOSIS — D0461 Carcinoma in situ of skin of right upper limb, including shoulder: Secondary | ICD-10-CM | POA: Diagnosis not present

## 2023-09-24 DIAGNOSIS — D692 Other nonthrombocytopenic purpura: Secondary | ICD-10-CM | POA: Diagnosis not present

## 2023-09-24 DIAGNOSIS — W908XXA Exposure to other nonionizing radiation, initial encounter: Secondary | ICD-10-CM | POA: Diagnosis not present

## 2023-09-24 DIAGNOSIS — L299 Pruritus, unspecified: Secondary | ICD-10-CM | POA: Diagnosis not present

## 2023-09-24 DIAGNOSIS — Z7189 Other specified counseling: Secondary | ICD-10-CM

## 2023-09-24 DIAGNOSIS — L578 Other skin changes due to chronic exposure to nonionizing radiation: Secondary | ICD-10-CM | POA: Diagnosis not present

## 2023-09-24 LAB — CBC
Hematocrit: 34.5 % — ABNORMAL LOW (ref 37.5–51.0)
Hemoglobin: 11.2 g/dL — ABNORMAL LOW (ref 13.0–17.7)
MCH: 30.5 pg (ref 26.6–33.0)
MCHC: 32.5 g/dL (ref 31.5–35.7)
MCV: 94 fL (ref 79–97)
Platelets: 138 10*3/uL — ABNORMAL LOW (ref 150–450)
RBC: 3.67 x10E6/uL — ABNORMAL LOW (ref 4.14–5.80)
RDW: 13.1 % (ref 11.6–15.4)
WBC: 5.4 10*3/uL (ref 3.4–10.8)

## 2023-09-24 LAB — BMP8+EGFR
BUN/Creatinine Ratio: 16 (ref 10–24)
BUN: 19 mg/dL (ref 8–27)
CO2: 24 mmol/L (ref 20–29)
Calcium: 8.7 mg/dL (ref 8.6–10.2)
Chloride: 104 mmol/L (ref 96–106)
Creatinine, Ser: 1.19 mg/dL (ref 0.76–1.27)
Glucose: 78 mg/dL (ref 70–99)
Potassium: 4.5 mmol/L (ref 3.5–5.2)
Sodium: 141 mmol/L (ref 134–144)
eGFR: 59 mL/min/{1.73_m2} — ABNORMAL LOW (ref >59)

## 2023-09-24 LAB — TSH+T4F+T3FREE
Free T4: 0.98 ng/dL (ref 0.82–1.77)
T3, Free: 2.1 pg/mL (ref 2.0–4.4)
TSH: 12.5 u[IU]/mL — ABNORMAL HIGH (ref 0.450–4.500)

## 2023-09-24 LAB — VITAMIN B12: Vitamin B-12: 284 pg/mL (ref 232–1245)

## 2023-09-24 MED ORDER — DUPIXENT 300 MG/2ML ~~LOC~~ SOSY: 300.0000 mg | PREFILLED_SYRINGE | SUBCUTANEOUS | 6 refills | Status: AC

## 2023-09-24 MED ORDER — DUPILUMAB 300 MG/2ML ~~LOC~~ SOSY
300.0000 mg | PREFILLED_SYRINGE | SUBCUTANEOUS | Status: AC
Start: 2023-09-24 — End: 2023-12-04
  Administered 2023-09-24 – 2023-12-04 (×6): 300 mg via SUBCUTANEOUS

## 2023-09-24 NOTE — Progress Notes (Signed)
 Complex Care Management Note Care Guide Note  09/24/2023 Name: Jared Rice MRN: [id number] DOB: [birth year]   Complex Care Management Outreach Attempts: An unsuccessful telephone outreach was attempted today to offer the patient information about available complex care management services.  Follow Up Plan:  Additional outreach attempts will be made to offer the patient complex care management information and services.   Encounter Outcome:  No Answer  Lenton Rail , RMA     [place]  [facility], [facility] Guide  Direct Dial: [phone]  Website: [email or web address]

## 2023-09-24 NOTE — Patient Instructions (Addendum)
 Wound Care Instructions  Cleanse wound gently with soap and water  once a day then pat dry with clean gauze. Apply a thin coat of Petrolatum (petroleum jelly, "Vaseline") over the wound (unless you have an allergy  to this). We recommend that you use a new, sterile tube of Vaseline. Do not pick or remove scabs. Do not remove the yellow or white "healing tissue" from the base of the wound.  Cover the wound with fresh, clean, nonstick gauze and secure with paper tape. You may use Band-Aids in place of gauze and tape if the wound is small enough, but would recommend trimming much of the tape off as there is often too much. Sometimes Band-Aids can irritate the skin.  You should call the office for your biopsy report after 1 week if you have not already been contacted.  If you experience any problems, such as abnormal amounts of bleeding, swelling, significant bruising, significant pain, or evidence of infection, please call the office immediately.  FOR ADULT SURGERY PATIENTS: If you need something for pain relief you may take 1 extra strength Tylenol  (acetaminophen ) AND 2 Ibuprofen (200mg  each) together every 4 hours as needed for pain. (do not take these if you are allergic to them or if you have a reason you should not take them.) Typically, you may only need pain medication for 1 to 3 days.   Start Clobetasol  cream once daily up to 5 days a week to itchy rash on back, avoid using on face, under arms and groin   Cryotherapy Aftercare  Wash gently with soap and water  everyday.   Apply Vaseline and Band-Aid daily until healed.      Due to recent changes in healthcare laws, you may see results of your pathology and/or laboratory studies on MyChart before the doctors have had a chance to review them. We understand that in some cases there may be results that are confusing or concerning to you. Please understand that not all results are received at the same time and often the doctors may need to  interpret multiple results in order to provide you with the best plan of care or course of treatment. Therefore, we ask that you please give us  2 business days to thoroughly review all your results before contacting the office for clarification. Should we see a critical lab result, you will be contacted sooner.   If You Need Anything After Your Visit  If you have any questions or concerns for your doctor, please call our main line at [phone] and press option 4 to reach your doctor's medical assistant. If no one answers, please leave a voicemail as directed and we will return your call as soon as possible. Messages left after 4 pm will be answered the following business day.   You may also send us  a message via MyChart. We typically respond to MyChart messages within 1-2 business days.  For prescription refills, please ask your pharmacy to contact our office. Our fax number is [phone].  If you have an urgent issue when the clinic is closed that cannot wait until the next business day, you can page your doctor at the number below.    Please note that while we do our best to be available for urgent issues outside of office hours, we are not available 24/7.   If you have an urgent issue and are unable to reach us , you may choose to seek medical care at your doctor's office, retail clinic, urgent care center, or emergency room.  If you have a medical emergency, please immediately call 911 or go to the emergency department.  Pager Numbers  - Dr. Bary Likes: [phone]  - Dr. Annette Barters: [phone]  - Dr. Felipe Horton: [phone]   In the event of inclement weather, please call our main line at [phone] for an update on the status of any delays or closures.  Dermatology Medication Tips: Please keep the boxes that topical medications come in in order to help keep track of the instructions about where and how to use these. Pharmacies typically print the medication instructions only on  the boxes and not directly on the medication tubes.   If your medication is too expensive, please contact our office at [phone] option 4 or send us  a message through MyChart.   We are unable to tell what your co-pay for medications will be in advance as this is different depending on your insurance coverage. However, we may be able to find a substitute medication at lower cost or fill out paperwork to get insurance to cover a needed medication.   If a prior authorization is required to get your medication covered by your insurance company, please allow us  1-2 business days to complete this process.  Drug prices often vary depending on where the prescription is filled and some pharmacies may offer cheaper prices.  The website www.goodrx.com contains coupons for medications through different pharmacies. The prices here do not account for what the cost may be with help from insurance (it may be cheaper with your insurance), but the website can give you the price if you did not use any insurance.  - You can print the associated coupon and take it with your prescription to the pharmacy.  - You may also stop by our office during regular business hours and pick up a GoodRx coupon card.  - If you need your prescription sent electronically to a different pharmacy, notify our office through [facility] or by phone at [phone] option 4.     Si Usted Necesita Algo Despus de Su Visita  Tambin puede enviarnos un mensaje a travs de Clinical cytogeneticist. Por lo general respondemos a los mensajes de MyChart en el transcurso de 1 a 2 das hbiles.  Para renovar recetas, por favor pida a su farmacia que se ponga en contacto con nuestra oficina. Franz Jacks de fax es [place] [phone].  Si tiene un asunto urgente cuando la clnica est cerrada y que no puede esperar hasta el siguiente da hbil, puede llamar/localizar a su doctor(a) al nmero que aparece a continuacin.   Por favor, tenga en cuenta que  aunque hacemos todo lo posible para estar disponibles para asuntos urgentes fuera del horario de [place], no estamos disponibles las 24 horas del da, los 7 [address]  [address] de la [place].   Si tiene un problema urgente y no puede comunicarse con nosotros, puede optar por buscar atencin mdica  en el consultorio de su doctor(a), en una clnica privada, en un centro de atencin urgente o en una sala de emergencias.  Si tiene Engineer, drilling, por favor llame inmediatamente al 911 o vaya a la sala de emergencias.  Nmeros de bper  - Dr. Bary Likes: [phone]  - Dra. Annette Barters: [phone]  - Dr. Felipe Horton: [phone]   En caso de inclemencias del tiempo, por favor llame a Lajuan Pila principal al [phone] para una actualizacin sobre el [place] de cualquier retraso o cierre.  Consejos para la medicacin en dermatologa: Por favor, guarde las cajas en las que vienen los  medicamentos de uso tpico para ayudarle a seguir las [employer] dnde y cmo usarlos. Las farmacias generalmente imprimen las instrucciones del medicamento slo en las cajas y no directamente en los tubos del [place].   Si su medicamento es muy caro, por favor, pngase en contacto con Bettyjane Brunet llamando al [phone] y presione la opcin 4 o envenos un mensaje a travs de Clinical cytogeneticist.   No podemos decirle cul ser su copago por los medicamentos por adelantado ya que esto es diferente dependiendo de la cobertura de su seguro. Sin embargo, es posible que podamos encontrar un medicamento sustituto a Audiological scientist un formulario para que el seguro cubra el medicamento que se considera necesario.   Si se requiere una autorizacin previa para que su compaa de seguros [place] su medicamento, por favor permtanos de 1 a 2 das hbiles para completar este proceso.  Los precios de los medicamentos varan con frecuencia dependiendo del Environmental consultant de dnde se surte la receta y alguna farmacias pueden ofrecer precios ms  baratos.  El sitio web www.goodrx.com tiene cupones para medicamentos de Health and safety inspector. Los precios aqu no tienen en cuenta lo que podra costar con la ayuda del seguro (puede ser ms barato con su seguro), pero el sitio web puede darle el precio si no utiliz Tourist information centre manager.  - Puede imprimir el cupn correspondiente y llevarlo con su receta a la farmacia.  - Tambin puede pasar por nuestra oficina durante el horario de atencin regular y Education officer, museum una tarjeta de cupones de GoodRx.  - Si necesita que su receta se enve electrnicamente a una farmacia diferente, informe a nuestra oficina a travs de MyChart de [place] o por telfono llamando al [phone] y presione la opcin 4.

## 2023-09-24 NOTE — Progress Notes (Unsigned)
 Follow-Up Visit   Subjective  Jared Rice is a 89 y.o. male who presents for the following: Atopic derm 2 wk f/u, back, shoulders Started Dupixent  09/10/23, not using clobetasol  cr The patient has spots, moles and lesions to be evaluated, some may be new or changing and the patient may have concern these could be cancer.  Patient accompanied by wife who contributes to history.  The following portions of the chart were reviewed this encounter and updated as appropriate: medications, allergies, medical history  Review of Systems:  No other skin or systemic complaints except as noted in HPI or Assessment and Plan.  Objective  Well appearing patient in no apparent distress; mood and affect are within normal limits.  A focused examination was performed of the following areas: Back, shoulders, scalp, face, arms, hands  Relevant exam findings are noted in the Assessment and Plan.  Scalp x 5, forehead x 1 (6) Pink scaly macules Right Forearm  x 1 Stuck on waxy paps with erythema Right Hand - Posterior 1.1cm hyperkeratotic pap   Assessment & Plan   ATOPIC DERMATITIS Severe with Pruritus Started Dupixent  09/10/2023 Back, shoulders Exam: Scaly pink papules coalescing to plaques 15% BSA Chronic and persistent condition with duration or expected duration over one year. Condition is improving with treatment but not currently at goal. Atopic dermatitis - Severe, on Dupixent  (biologic medication).  Atopic dermatitis (eczema) is a chronic, relapsing, pruritic condition that can significantly affect quality of life. It is often associated with allergic rhinitis and/or asthma and can require treatment with topical medications, phototherapy, or in severe cases biologic medications, which require long term medication management.  Treatment Plan: Cont Dupixent  300mg /29ml sq injections q 2 wks Dupixent  300mg /36ml sq injection today to L upper arm, sample x 1 Lot [id number], exp 01/2026 Increase  Clobetasol  use to qd up to 5d/wk as needed avoid f/g/a  Recommend gentle skin care.  Purpura - Chronic; persistent and recurrent.  Treatable, but not curable. Hands, arms, scalp - Violaceous macules and patches - Benign - Related to trauma, age, sun damage and/or use of blood thinners, chronic use of topical and/or oral steroids - Observe - Can use OTC arnica containing moisturizer such as Dermend Bruise Formula if desired - Call for worsening or other concerns  AK (ACTINIC KERATOSIS) (6) Scalp x 5, forehead x 1 (6) Actinic keratoses are precancerous spots that appear secondary to cumulative UV radiation exposure/sun exposure over time. They are chronic with expected duration over 1 year. A portion of actinic keratoses will progress to squamous cell carcinoma of the skin. It is not possible to reliably predict which spots will progress to skin cancer and so treatment is recommended to prevent development of skin cancer.  Recommend daily broad spectrum sunscreen SPF 30+ to sun-exposed areas, reapply every 2 hours as needed.  Recommend staying in the shade or wearing long sleeves, sun glasses (UVA+UVB protection) and wide brim hats (4-inch brim around the entire circumference of the hat). Call for new or changing lesions. Destruction of lesion - Scalp x 5, forehead x 1 (6) Complexity: simple   Destruction method: cryotherapy   Informed consent: discussed and consent obtained   Timeout:  patient name, date of birth, surgical site, and procedure verified Lesion destroyed using liquid nitrogen: Yes   Region frozen until ice ball extended beyond lesion: Yes   Outcome: patient tolerated procedure well with no complications   Post-procedure details: wound care instructions given   INFLAMED SEBORRHEIC KERATOSIS Right Forearm  x 1 Symptomatic, irritating, patient would like treated. Destruction of lesion - Right Forearm  x 1 Complexity: simple   Destruction method: cryotherapy   Informed  consent: discussed and consent obtained   Timeout:  patient name, date of birth, surgical site, and procedure verified Lesion destroyed using liquid nitrogen: Yes   Region frozen until ice ball extended beyond lesion: Yes   Outcome: patient tolerated procedure well with no complications   Post-procedure details: wound care instructions given   NEOPLASM OF SKIN Right Hand - Posterior Epidermal / dermal shaving  Lesion diameter (cm):  1.1 Informed consent: discussed and consent obtained   Timeout: patient name, date of birth, surgical site, and procedure verified   Procedure prep:  Patient was prepped and draped in usual sterile fashion Prep type:  Isopropyl alcohol Anesthesia: the lesion was anesthetized in a standard fashion   Anesthetic:  1% lidocaine  w/ epinephrine 1-100,000 buffered w/ 8.4% NaHCO3 Instrument used: flexible razor blade   Hemostasis achieved with: pressure, aluminum chloride and electrodesiccation   Outcome: patient tolerated procedure well   Post-procedure details: sterile dressing applied and wound care instructions given   Dressing type: bandage and bacitracin    Destruction of lesion Complexity: extensive   Destruction method: electrodesiccation and curettage   Informed consent: discussed and consent obtained   Timeout:  patient name, date of birth, surgical site, and procedure verified Procedure prep:  Patient was prepped and draped in usual sterile fashion Prep type:  Isopropyl alcohol Anesthesia: the lesion was anesthetized in a standard fashion   Anesthetic:  1% lidocaine  w/ epinephrine 1-100,000 buffered w/ 8.4% NaHCO3 Curettage performed in three different directions: Yes   Electrodesiccation performed over the curetted area: Yes   Lesion length (cm):  1.1 Lesion width (cm):  1.1 Margin per side (cm):  0.2 Final wound size (cm):  1.5 Hemostasis achieved with:  pressure, aluminum chloride and electrodesiccation Outcome: patient tolerated procedure  well with no complications   Post-procedure details: sterile dressing applied and wound care instructions given   Dressing type: bandage and bacitracin   Specimen 1 - Surgical pathology Differential Diagnosis: R/O SCC  Check Margins: yes 1.1cm hyperkeratotic pap EDC today OTHER ATOPIC DERMATITIS   Related Medications dupilumab  (DUPIXENT ) prefilled syringe 300 mg  COUNSELING AND COORDINATION OF CARE   MEDICATION MANAGEMENT   LONG-TERM USE OF HIGH-RISK MEDICATION   PURPURA (HCC)   ACTINIC SKIN DAMAGE    ACTINIC DAMAGE - chronic, secondary to cumulative UV radiation exposure/sun exposure over time - diffuse scaly erythematous macules with underlying dyspigmentation - Recommend daily broad spectrum sunscreen SPF 30+ to sun-exposed areas, reapply every 2 hours as needed.  - Recommend staying in the shade or wearing long sleeves, sun glasses (UVA+UVB protection) and wide brim hats (4-inch brim around the entire circumference of the hat). - Call for new or changing lesions.   Return in about 2 weeks (around 10/08/2023) for nurse for Dupixent , 2-59m Dr. Bary Likes , Atopic Derm.  I, Rollie Clipper, RMA, am acting as scribe for Celine Collard, MD .   Documentation: I have reviewed the above documentation for accuracy and completeness, and I agree with the above.  Celine Collard, MD

## 2023-09-25 ENCOUNTER — Encounter: Payer: Self-pay | Admitting: Dermatology

## 2023-09-29 ENCOUNTER — Ambulatory Visit: Admitting: Dermatology

## 2023-09-29 NOTE — Progress Notes (Deleted)
   Follow-Up Visit   Subjective  Jared Rice is a 89 y.o. male who presents for the following: Mohs of right supratip of nose  The following portions of the chart were reviewed this encounter and updated as appropriate: medications, allergies, medical history  Review of Systems:  No other skin or systemic complaints except as noted in HPI or Assessment and Plan.  Objective  Well appearing patient in no apparent distress; mood and affect are within normal limits.  A focused examination was performed of the following areas: Right supratip of nose Relevant physical exam findings are noted in the Assessment and Plan.     Assessment & Plan      No follow-ups on file.  ***  Documentation: I have reviewed the above documentation for accuracy and completeness, and I agree with the above.  Deneise Finlay, MD

## 2023-10-01 ENCOUNTER — Encounter: Payer: Self-pay | Admitting: Family Medicine

## 2023-10-01 LAB — SURGICAL PATHOLOGY

## 2023-10-01 NOTE — Progress Notes (Signed)
 Complex Care Management Note Care Guide Note  10/01/2023 Name: Jared Rice MRN: [id number] DOB: 12/09/1932   Complex Care Management Outreach Attempts: An unsuccessful telephone outreach was attempted today to offer the patient information about available complex care management services.  Follow Up Plan:  Additional outreach attempts will be made to offer the patient complex care management information and services.   Encounter Outcome:  No Answer  Lenton Rail , RMA     [name]  [facility], [facility] Guide  Direct Dial: [phone]  Website: [email or web address]

## 2023-10-01 NOTE — Addendum Note (Signed)
 Addended by: SIMMONS-ROBINSON, Niambi Smoak L on: 10/01/2023 07:53 AM   Modules accepted: Orders

## 2023-10-02 ENCOUNTER — Telehealth: Payer: Self-pay

## 2023-10-02 ENCOUNTER — Encounter: Payer: Self-pay | Admitting: Dermatology

## 2023-10-02 NOTE — Telephone Encounter (Signed)
-----   Message from Celine Collard sent at 10/02/2023 12:40 PM EDT ----- FINAL DIAGNOSIS        1. Skin, right hand-posterior :       SQUAMOUS CELL CARCINOMA IN SITU, BASE INVOLVED   Cancer = SCCis Superficial Already treated Recheck next visit

## 2023-10-02 NOTE — Telephone Encounter (Signed)
 Patient daughter called to discuss biopsy results, I discussed biopsy results with pt daughter

## 2023-10-02 NOTE — Telephone Encounter (Signed)
 Advised pt of bx results/sh ?

## 2023-10-06 NOTE — Progress Notes (Signed)
 Complex Care Management Note  Care Guide Note 10/06/2023 Name: ELEAZER BARQUERO MRN: [id number] DOB: [birth year]  Pleas Brill Cunanan is a 89 y.o. year old male who sees Simmons-Robinson, Judyann Number, MD for primary care. I reached out to Royston Cornea A Castner by phone today to offer complex care management services.  Mr. Lammert was given information about Complex Care Management services today including:   The Complex Care Management services include support from the care team which includes your Nurse Care Manager, Clinical Social Worker, or Pharmacist.  The Complex Care Management team is here to help remove barriers to the health concerns and goals most important to you. Complex Care Management services are voluntary, and the patient may decline or stop services at any time by request to their care team member.   Complex Care Management Consent Status: Patient agreed to services and verbal consent obtained.   Follow up plan:  Telephone appointment with complex care management team member scheduled for:  10/13/2023  Encounter Outcome:  Patient Scheduled  Lenton Rail , RMA     [place]  [facility], [facility] Guide  Direct Dial: [phone]  Website: [email or web address]

## 2023-10-07 ENCOUNTER — Encounter: Payer: Self-pay | Admitting: Dermatology

## 2023-10-08 ENCOUNTER — Ambulatory Visit

## 2023-10-08 DIAGNOSIS — L2089 Other atopic dermatitis: Secondary | ICD-10-CM | POA: Diagnosis not present

## 2023-10-08 NOTE — Progress Notes (Signed)
 Patient here today for Dupixent  injection for atopic dermatitis.   Dupixent  300mg  injected into right arm. Patient tolerated injection well.  LOT: [id number] EXP: 08/2025  Lisbeth Rides, RMA

## 2023-10-13 ENCOUNTER — Other Ambulatory Visit: Payer: Self-pay | Admitting: Family Medicine

## 2023-10-13 ENCOUNTER — Other Ambulatory Visit: Payer: Self-pay | Admitting: Licensed Clinical Social Worker

## 2023-10-13 ENCOUNTER — Encounter: Payer: Self-pay | Admitting: Dermatology

## 2023-10-13 ENCOUNTER — Telehealth: Payer: Self-pay | Admitting: Family Medicine

## 2023-10-13 ENCOUNTER — Ambulatory Visit: Admitting: Dermatology

## 2023-10-13 VITALS — BP 149/116 | Temp 98.3°F

## 2023-10-13 DIAGNOSIS — L579 Skin changes due to chronic exposure to nonionizing radiation, unspecified: Secondary | ICD-10-CM | POA: Diagnosis not present

## 2023-10-13 DIAGNOSIS — L814 Other melanin hyperpigmentation: Secondary | ICD-10-CM

## 2023-10-13 DIAGNOSIS — E039 Hypothyroidism, unspecified: Secondary | ICD-10-CM

## 2023-10-13 DIAGNOSIS — C44319 Basal cell carcinoma of skin of other parts of face: Secondary | ICD-10-CM

## 2023-10-13 DIAGNOSIS — C4491 Basal cell carcinoma of skin, unspecified: Secondary | ICD-10-CM

## 2023-10-13 MED ORDER — OXYCODONE HCL 5 MG PO TABS: 5.0000 mg | ORAL_TABLET | Freq: Four times a day (QID) | ORAL | 0 refills | Status: DC | PRN

## 2023-10-13 MED ORDER — LEVOTHYROXINE SODIUM 88 MCG PO TABS: ORAL_TABLET | ORAL | 1 refills | Status: DC

## 2023-10-13 NOTE — Telephone Encounter (Signed)
 Daughter states he is out of meds, states he might have lost them too.   Copied from CRM [id number]. Topic: Clinical - Medication Refill >> Oct 13, 2023  8:57 AM Leory Rands wrote: Medication: levothyroxine  (SYNTHROID ) 88 MCG tablet [[id number]]  Has the patient contacted their pharmacy? Yes (Agent: If no, request that the patient contact the pharmacy for the refill. If patient does not wish to contact the pharmacy document the reason why and proceed with request.) (Agent: If yes, when and what did the pharmacy advise?)  This is the patient's preferred pharmacy:    [id number] [place] [facility] - [address] [place] [address] Phone: [phone] Fax: [phone]   Is this the correct pharmacy for this prescription? Yes If no, delete pharmacy and type the correct one.   Has the prescription been filled recently? Yes  Is the patient out of the medication? Yes  Has the patient been seen for an appointment in the last year OR does the patient have an upcoming appointment? Yes  Can we respond through MyChart? Yes  Agent: Please be advised that Rx refills may take up to 3 business days. We ask that you follow-up with your pharmacy.

## 2023-10-13 NOTE — Progress Notes (Signed)
 Follow-Up Visit   Subjective  Jared Rice is a 89 y.o. male who presents for the following: Mohs of a Nodular and Infiltrative Basal Cell Carcinoma of the right medial cheek, referred by Dr. Felipe Horton.   The following portions of the chart were reviewed this encounter and updated as appropriate: medications, allergies, medical history  Review of Systems:  No other skin or systemic complaints except as noted in HPI or Assessment and Plan.  Objective  Well appearing patient in no apparent distress; mood and affect are within normal limits.  A focused examination was performed of the following areas: Right medial cheek Relevant physical exam findings are noted in the Assessment and Plan.   Right medial Cheek Healing biopsy site   Assessment & Plan   BASAL CELL CARCINOMA (BCC), UNSPECIFIED SITE Right medial Cheek Mohs surgery  Consent obtained: written  Anticoagulation: Was the anticoagulation regimen changed prior to Mohs? No    Anesthesia: Anesthesia method: local infiltration Local anesthetic: lidocaine  1% WITH epi  Procedure Details: Timeout: pre-procedure verification complete Procedure Prep: patient was prepped and draped in usual sterile fashion Prep type: chlorhexidine  Biopsy accession number: [id number] Biopsy lab: [facility] Date of biopsy: 10/13/2023 Pre-Op diagnosis: basal cell carcinoma BCC subtype: infiltrative and nodular MohsAIQ Surgical site (if tumor spans multiple areas, please select predominant area): cheek (including jawline) Surgery side: right Surgical site (from skin exam): Right medial Cheek Pre-operative length (cm): 1.2 Pre-operative width (cm): 0.6 Indications for Mohs surgery: anatomic location where tissue conservation is critical Previously treated? No    Micrographic Surgery Details: Post-operative length (cm): 2.3 Post-operative width (cm): 4.6 Number of Mohs stages: 3  Stage 1    Tumor features identified on Mohs  section: basal carcinoma  Stage 2    Tumor features identified on Mohs section: basal carcinoma  Stage 3    Tumor features identified on Mohs section: no tumor identified  Patient tolerance of procedure: tolerated well, no immediate complications  Reconstruction: Was the defect reconstructed? Yes   Was reconstruction performed by the same Mohs surgeon? Yes   Setting of reconstruction: outpatient office When was reconstruction performed? same day Type of reconstruction: linear Linear reconstruction: complex Length of linear repair (cm): 9  Opioids: Did the patient receive a prescription for opioid/narcotic related to Mohs surgery? Yes    Antibiotics: Does patient meet AHA guidelines for endocarditis?: No   Does patient meet AHA guidelines for orthopedic prophylaxis?: No   Were antibiotics given on the day of surgery?: No   Did surgery breach mucosa, expose cartilage/bone, involve an area of lymphedema/inflamed/infected tissue? No    Skin repair Complexity:  Complex Final length (cm):  8.9 Informed consent: discussed and consent obtained   Timeout: patient name, date of birth, surgical site, and procedure verified   Procedure prep:  Patient was prepped and draped in usual sterile fashion Prep type:  Chlorhexidine  Anesthesia: the lesion was anesthetized in a standard fashion   Anesthetic:  1% lidocaine  w/ epinephrine 1-100,000 local infiltration Reason for type of repair: allow closure of the large defect, preserve normal anatomical and functional relationships, avoid adjacent structures and allow side-to-side closure without requiring a flap or graft   Undermining: area extensively undermined   Subcutaneous layers (deep stitches):  Suture size:  5-0 Suture type: Monocryl (poliglecaprone 25)   Fine/surface layer approximation (top stitches):  Suture size:  6-0 Suture type: fast-absorbing plain gut   Stitches: simple running   Hemostasis achieved with: suture, pressure and  electrodesiccation  Outcome: patient tolerated procedure well with no complications   Post-procedure details: wound care instructions given   Related Medications oxyCODONE  (OXY IR/ROXICODONE ) 5 MG immediate release tablet Take 1 tablet (5 mg total) by mouth every 6 (six) hours as needed for up to 8 doses for severe pain (pain score 7-10).  Return in 2 weeks (on 10/27/2023) for Mohs BCC right supratip of nose in 2 weeks.Maurine Sovereign, RN, am acting as scribe for Deneise Finlay, MD .  10/13/2023  HISTORY OF PRESENT ILLNESS  Jared Rice is seen in consultation at the request of Dr. Felipe Horton for biopsy-proven Nodular and  Infiltrative Basal Cell Carcinoma on the right cheek. They note that the area has been present for about 6 months increasing in size with time.  There is no history of previous treatment.  Reports no other new or changing lesions and has no other complaints today.  Medications and allergies: see patient chart.  Review of systems: Reviewed 8 systems and notable for the above skin cancer.  All other systems reviewed are unremarkable/negative, unless noted in the HPI. Past medical history, surgical history, family history, social history were also reviewed and are noted in the chart/questionnaire.    PHYSICAL EXAMINATION  General: Well-appearing, in no acute distress, alert and oriented x 4. Vitals reviewed in chart (if available).   Skin: Exam reveals a 1.2 x 0.6 cm erythematous papule and biopsy scar on the right cheek. There are rhytids, telangiectasias, and lentigines, consistent with photodamage.  Biopsy report(s) reviewed, confirming the diagnosis.   ASSESSMENT  1) Nodular and Infiltrative Basal Cell Carcinoma on the right cheek 2) photodamage 3) solar lentigines   PLAN   1. Due to location, size, histology, or recurrence and the likelihood of subclinical extension as well as the need to conserve normal surrounding tissue, the patient was deemed acceptable  for Mohs micrographic surgery (MMS).  The nature and purpose of the procedure, associated benefits and risks including recurrence and scarring, possible complications such as pain, infection, and bleeding, and alternative methods of treatment if appropriate were discussed with the patient during consent. The lesion location was verified by the patient, by reviewing previous notes, pathology reports, and by photographs as well as angulation measurements if available.  Informed consent was reviewed and signed by the patient, and timeout was performed at 10:30 AM. See op note below.  2. For the photodamage and solar lentigines, sun protection discussed/information given on OTC sunscreens, and we recommend continued regular follow-up with primary dermatologist every 6 months or sooner for any growing, bleeding, or changing lesions. 3. Prognosis and future surveillance discussed. 4. Letter with treatment outcome sent to referring provider. 5. Pain acetaminophen /oxycodone  5 mg   MOHS MICROGRAPHIC SURGERY AND RECONSTRUCTION  Initial size:   1.2 x 0.6 cm Surgical defect/wound size: 4.6 x 2.3 cm Anesthesia:    0.33% lidocaine  with 1:200,000 epinephrine EBL:    <5 mL Complications:  None Repair type:   Complex SQ suture:   5-0 Monocryl Cutaneous suture:  6-0 Plain gut Final size of the repair: 8.9 cm  Stages: 3  STAGE I: Anesthesia achieved with 0.5% lidocaine  with 1:200,000 epinephrine. ChloraPrep applied.  section(s) excised using Mohs technique (this includes total peripheral and deep tissue margin excision and evaluation with frozen sections, excised and interpreted by the same physician). The tumor was first debulked and then excised with an approx. 2 mm margin.  Hemostasis was achieved with electrocautery as needed.  The specimen was  then oriented, subdivided/relaxed, inked, and processed using Mohs technique.    Frozen section analysis revealed a positive margin for 2 in the peripheral margin.     STAGE II: An additional 2 mm margin was excised.  Hemostasis was achieved with electrocautery as needed.  The specimen was then oriented, subdivided/relaxed, inked, and processed using Mohs technique.   Frozen section analysis revealed a positive margin for 1 in the peripheral margin.  STAGE III: An additional 2 mm margin was excised.  Hemostasis was achieved with electrocautery as needed.  The specimen was then oriented, subdivided/relaxed, inked, and processed using Mohs technique. Evaluation of slides by the Mohs surgeon revealed clear tumor margins.   Reconstruction  The surgical wound was then cleaned, prepped, and re-anesthetized as above. Wound edges were undermined extensively along at least one entire edge and at a distance equal to or greater than the width of the defect (see wound defect size above) in order to achieve closure and decrease wound tension and anatomic distortion. Redundant tissue repair including standing cone removal was performed. Hemostasis was achieved with electrocautery. Subcutaneous and epidermal tissues were approximated with the above sutures. The surgical site was then lightly scrubbed with sterile, saline-soaked gauze. Steri-strips were applied, and the area was then bandaged using Vaseline ointment, non-adherent gauze, gauze pads, and tape to provide an adequate pressure dressing. The patient tolerated the procedure well, was given detailed written and verbal wound care instructions, and was discharged in good condition.   The patient will follow-up: 4 weeks.   Documentation: I have reviewed the above documentation for accuracy and completeness, and I agree with the above.  Deneise Finlay, MD

## 2023-10-13 NOTE — Patient Outreach (Signed)
 Complex Care Management   Visit Note  10/13/2023  Name:  Jared Rice MRN: [id number] DOB: 1932-08-17  Situation: Referral received for Complex Care Management related to Dementia I obtained verbal consent from Patient.  Visit completed with dtr  on the phone  Background:   Past Medical History:  Diagnosis Date   Actinic keratosis    Anginal pain (HCC)    Aortic atherosclerosis (HCC)    Arthritis    Atrial fibrillation (HCC)    Basal cell carcinoma 02/06/2010   Left shoulder. Superficial.   Basal cell carcinoma 10/11/2013   Right medial forearm. Superficial   Basal cell carcinoma 04/10/2015   Right inf. lat. thigh. Superficial   Basal cell carcinoma 12/09/2016   Right cheek. Superficial.   Basal cell carcinoma 08/18/2023   Right Medial Cheek, referral for mohs to Dr. [facility]   Basal cell carcinoma 08/18/2023   Right Supratip of Nose, referral to Dr. [facility] for mohs   Chronic airway obstruction, not elsewhere classified    Colon polyps    Complication of anesthesia    SPINAL DID NOT WORK FOR LAST KNEE REPLACEMENT AND HAD TO HAVE GA   Coronary atherosclerosis of native coronary artery    nonobstructive   Diabetes mellitus without complication (HCC)    Essential hypertension, benign    GERD (gastroesophageal reflux disease)    H/O   Glucose intolerance (impaired glucose tolerance)    Heart murmur    History of kidney stones    Hyperlipidemia    Hypothyroidism    Iron deficiency anemia    Knee pain, right    LBBB (left bundle branch block)    Occlusion and stenosis of carotid artery without mention of cerebral infarction    wears compression stockings   Other dyspnea and respiratory abnormality    w/ pseuodowheeze resolves with purse lip manuever   Precordial pain    Shoulder pain, right    Squamous cell carcinoma of skin 10/06/2007   Right forearm. SCCis   Squamous cell carcinoma of skin 10/11/2013   Left mid lat. pretibial. KA-like pattern.   Squamous cell  carcinoma of skin 09/24/2023   SCC IS,  Right Hand - Posterior, EDC   Tremor    Unspecified sleep apnea    HAS NOT USED CPAP IN 20 YEARS   Venous insufficiency    Wears dentures    full upper   Wound cellulitis- right lower leg anterior  10/29/2019    Assessment: Patient Reported Symptoms:  Cognitive Cognitive Status: Alert and oriented to person, place, and time, Normal speech and language skills Cognitive/Intellectual Conditions Management [RPT]: None reported or documented in medical history or problem list   Health Maintenance Behaviors: Annual physical exam, Sleep adequate Healing Pattern: Unsure Health Facilitated by: Stress management  Neurological Neurological Review of Symptoms: No symptoms reported Neurological Conditions: Dementia Neurological Management Strategies: Coping strategies, Medication therapy Neurological Comment: Dtr has home health starting next week to assist with around the home.  HEENT HEENT Symptoms Reported: No symptoms reported      Cardiovascular Cardiovascular Symptoms Reported: No symptoms reported    Respiratory Respiratory Symptoms Reported: No symptoms reported    Endocrine Patient reports the following symptoms related to hypoglycemia or hyperglycemia : No symptoms reported    Gastrointestinal Gastrointestinal Symptoms Reported: No symptoms reported      Genitourinary Genitourinary Symptoms Reported: No symptoms reported    Integumentary Integumentary Symptoms Reported: No symptoms reported    Musculoskeletal Musculoskelatal Symptoms Reviewed: No symptoms  reported        Psychosocial Psychosocial Symptoms Reported: No symptoms reported     Quality of Family Relationships: helpful, involved, supportive Do you feel physically threatened by others?: No      09/01/2023   10:56 AM  Depression screen PHQ 2/9  Down, Depressed, Hopeless 0  PHQ - 2 Score 0  Altered sleeping 0  Tired, decreased energy 0  Change in appetite 0  Feeling  bad or failure about yourself  0  Trouble concentrating 0  Moving slowly or fidgety/restless 0  Suicidal thoughts 0  PHQ-9 Score 0  Difficult doing work/chores Not difficult at all    There were no vitals filed for this visit.  Medications Reviewed Today     Reviewed by Jens Molder, LCSW (Social Worker) on 10/13/23 at 1541  Med List Status: <None>   Medication Order Taking? Sig Documenting Provider Last Dose Status Informant  apixaban  (ELIQUIS ) 5 MG TABS tablet [id number] Yes Take 1 tablet (5 mg total) by mouth 2 (two) times daily. [place], Peter M, MD Taking Active   clobetasol  cream (TEMOVATE ) 0.05 % [id number] No Apply twice a day until smooth. Avoid applying to face, groin, and axilla.  Patient not taking: Reported on 10/13/2023   Mimi Alt, MD Not Taking Active   cyanocobalamin  (VITAMIN B12) 1000 MCG tablet [id number] Yes Take 1 tablet (1,000 mcg total) by mouth daily. Brigitte Canard, PA-C Taking Active   doxazosin  (CARDURA ) 8 MG tablet [id number] Yes Take 1 tablet (8 mg total) by mouth daily. Simmons-Robinson, [place], MD Taking Active   dupilumab  (DUPIXENT ) 300 MG/2ML prefilled syringe [id number] Yes Inject 300 mg into the skin every 14 (fourteen) days. Starting at day 15 for maintenance. Elta Halter, MD Taking Active   dupilumab  (DUPIXENT ) prefilled syringe 300 mg [id number]   Elta Halter, MD  Active   furosemide  (LASIX ) 40 MG tablet [id number]  TAKE 1 TABLET BY MOUTH ONCE DAILY (DOSE CHANGE) Simmons-Robinson, Makiera, MD  Active   levothyroxine  (SYNTHROID ) 88 MCG tablet [id number] Yes TAKE 1 TABLET BY MOUTH ONCE DAILY IN THE MORNING BEFORE BREAKFAST Simmons-Robinson, Makiera, MD Taking Active   psyllium (METAMUCIL) 58.6 % packet [id number]  Take 1 packet by mouth daily. [provider]  Active   ramipril  (ALTACE ) 5 MG capsule [id number] Yes TAKE 1 CAPSULE BY MOUTH EVERY DAY Simmons-Robinson, Makiera, MD Taking Active   rosuvastatin  (CRESTOR ) 10 MG  tablet [id number] Yes Take 1 tablet by mouth once daily Simmons-Robinson, Makiera, MD Taking Active             Recommendation:   Dtr reported home health will be starting this week to assist with daily activities. Dtr denied any further needs at this time. Dtr was encouraged to reach out with any changes.  Follow Up Plan:   Patient has met all care management goals. Care Management case will be closed. Patient has been provided contact information should new needs arise.   Hale Level, LCSW [facility], [facility] Licensed Clinical Social Worker Care Coordinator [phone]

## 2023-10-13 NOTE — Telephone Encounter (Signed)
 [facility] is requesting refill levothyroxine (SYNTHROID) 88 MCG tablet  Please advise

## 2023-10-13 NOTE — Telephone Encounter (Signed)
 Copied from CRM [id number]. Topic: Clinical - Medication Refill >> Oct 13, 2023  8:57 AM Leory Rands wrote: Medication: levothyroxine  (SYNTHROID ) 88 MCG tablet [[id number]]  Has the patient contacted their pharmacy? Yes (Agent: If no, request that the patient contact the pharmacy for the refill. If patient does not wish to contact the pharmacy document the reason why and proceed with request.) (Agent: If yes, when and what did the pharmacy advise?)  This is the patient's preferred pharmacy:    [id number] [place] [facility] - [address] [place] [address] Phone: [phone] Fax: [phone]   Is this the correct pharmacy for this prescription? Yes If no, delete pharmacy and type the correct one.   Has the prescription been filled recently? Yes  Is the patient out of the medication? Yes  Has the patient been seen for an appointment in the last year OR does the patient have an upcoming appointment? Yes  Can we respond through MyChart? Yes  Agent: Please be advised that Rx refills may take up to 3 business days. We ask that you follow-up with your pharmacy.

## 2023-10-13 NOTE — Telephone Encounter (Signed)
Converted to refill request 

## 2023-10-13 NOTE — Patient Instructions (Addendum)

## 2023-10-15 MED ORDER — LEVOTHYROXINE SODIUM 88 MCG PO TABS: ORAL_TABLET | ORAL | 1 refills | Status: DC

## 2023-10-15 NOTE — Telephone Encounter (Signed)
 Requested Prescriptions  Pending Prescriptions Disp Refills   levothyroxine  (SYNTHROID ) 88 MCG tablet 90 tablet 1    Sig: TAKE 1 TABLET BY MOUTH ONCE DAILY IN THE MORNING BEFORE BREAKFAST     Endocrinology:  Hypothyroid Agents Failed - 10/15/2023  9:50 AM      Failed - TSH in normal range and within 360 days    TSH  Date Value Ref Range Status  09/23/2023 12.500 (H) 0.450 - 4.500 uIU/mL Final         Passed - Valid encounter within last 12 months    Recent Outpatient Visits           3 weeks ago HYPERTENSION, BENIGN   [facility] Simmons-Robinson, [place], MD   1 month ago Rash   [facility] [place], Judyann Number, MD       Future Appointments             In 1 month Elta Halter, MD [facility]            Signed Prescriptions Disp Refills   levothyroxine  (SYNTHROID ) 88 MCG tablet 90 tablet 1    Sig: TAKE 1 TABLET BY MOUTH ONCE DAILY IN THE MORNING BEFORE BREAKFAST     Endocrinology:  Hypothyroid Agents Failed - 10/15/2023  9:50 AM      Failed - TSH in normal range and within 360 days    TSH  Date Value Ref Range Status  09/23/2023 12.500 (H) 0.450 - 4.500 uIU/mL Final         Passed - Valid encounter within last 12 months    Recent Outpatient Visits           3 weeks ago HYPERTENSION, BENIGN    [facility] Simmons-Robinson, [place], MD   1 month ago Rash    [facility] [place], Judyann Number, MD       Future Appointments             In 1 month Elta Halter, MD [facility]

## 2023-10-18 DIAGNOSIS — Z96653 Presence of artificial knee joint, bilateral: Secondary | ICD-10-CM | POA: Diagnosis not present

## 2023-10-18 DIAGNOSIS — I4891 Unspecified atrial fibrillation: Secondary | ICD-10-CM | POA: Diagnosis not present

## 2023-10-18 DIAGNOSIS — Z8619 Personal history of other infectious and parasitic diseases: Secondary | ICD-10-CM | POA: Diagnosis not present

## 2023-10-18 DIAGNOSIS — Z9181 History of falling: Secondary | ICD-10-CM | POA: Diagnosis not present

## 2023-10-18 DIAGNOSIS — R32 Unspecified urinary incontinence: Secondary | ICD-10-CM | POA: Diagnosis not present

## 2023-10-18 DIAGNOSIS — E785 Hyperlipidemia, unspecified: Secondary | ICD-10-CM | POA: Diagnosis not present

## 2023-10-18 DIAGNOSIS — E039 Hypothyroidism, unspecified: Secondary | ICD-10-CM | POA: Diagnosis not present

## 2023-10-18 DIAGNOSIS — E559 Vitamin D deficiency, unspecified: Secondary | ICD-10-CM | POA: Diagnosis not present

## 2023-10-18 DIAGNOSIS — Z9049 Acquired absence of other specified parts of digestive tract: Secondary | ICD-10-CM | POA: Diagnosis not present

## 2023-10-18 DIAGNOSIS — I110 Hypertensive heart disease with heart failure: Secondary | ICD-10-CM | POA: Diagnosis not present

## 2023-10-18 DIAGNOSIS — I509 Heart failure, unspecified: Secondary | ICD-10-CM | POA: Diagnosis not present

## 2023-10-18 DIAGNOSIS — Z48817 Encounter for surgical aftercare following surgery on the skin and subcutaneous tissue: Secondary | ICD-10-CM | POA: Diagnosis not present

## 2023-10-18 DIAGNOSIS — Z8572 Personal history of non-Hodgkin lymphomas: Secondary | ICD-10-CM | POA: Diagnosis not present

## 2023-10-18 DIAGNOSIS — R2689 Other abnormalities of gait and mobility: Secondary | ICD-10-CM | POA: Diagnosis not present

## 2023-10-18 DIAGNOSIS — E538 Deficiency of other specified B group vitamins: Secondary | ICD-10-CM | POA: Diagnosis not present

## 2023-10-18 DIAGNOSIS — G3184 Mild cognitive impairment, so stated: Secondary | ICD-10-CM | POA: Diagnosis not present

## 2023-10-18 DIAGNOSIS — Z7901 Long term (current) use of anticoagulants: Secondary | ICD-10-CM | POA: Diagnosis not present

## 2023-10-18 DIAGNOSIS — G250 Essential tremor: Secondary | ICD-10-CM | POA: Diagnosis not present

## 2023-10-18 DIAGNOSIS — Z87891 Personal history of nicotine dependence: Secondary | ICD-10-CM | POA: Diagnosis not present

## 2023-10-18 DIAGNOSIS — I10 Essential (primary) hypertension: Secondary | ICD-10-CM | POA: Diagnosis not present

## 2023-10-22 ENCOUNTER — Ambulatory Visit

## 2023-10-22 DIAGNOSIS — L209 Atopic dermatitis, unspecified: Secondary | ICD-10-CM

## 2023-10-22 NOTE — Progress Notes (Signed)
 Patient here today for Dupixent  injection for atopic dermatitis.    Dupixent  300mg  injected into left arm. Patient tolerated injection well.   LOT: [id number] EXP: 08/2025   Lisbeth Rides, RMA

## 2023-10-27 ENCOUNTER — Ambulatory Visit: Admitting: Dermatology

## 2023-10-27 ENCOUNTER — Encounter: Admitting: Dermatology

## 2023-10-27 ENCOUNTER — Encounter: Payer: Self-pay | Admitting: Dermatology

## 2023-10-27 VITALS — BP 103/70 | HR 74 | Temp 98.4°F

## 2023-10-27 DIAGNOSIS — C44311 Basal cell carcinoma of skin of nose: Secondary | ICD-10-CM

## 2023-10-27 DIAGNOSIS — Z85828 Personal history of other malignant neoplasm of skin: Secondary | ICD-10-CM

## 2023-10-27 DIAGNOSIS — C4491 Basal cell carcinoma of skin, unspecified: Secondary | ICD-10-CM

## 2023-10-27 DIAGNOSIS — L579 Skin changes due to chronic exposure to nonionizing radiation, unspecified: Secondary | ICD-10-CM

## 2023-10-27 DIAGNOSIS — L905 Scar conditions and fibrosis of skin: Secondary | ICD-10-CM

## 2023-10-27 DIAGNOSIS — L814 Other melanin hyperpigmentation: Secondary | ICD-10-CM | POA: Diagnosis not present

## 2023-10-27 DIAGNOSIS — L570 Actinic keratosis: Secondary | ICD-10-CM

## 2023-10-27 MED ORDER — MUPIROCIN 2 % EX OINT: 1.0000 | TOPICAL_OINTMENT | Freq: Two times a day (BID) | CUTANEOUS | 1 refills | Status: DC

## 2023-10-27 NOTE — Patient Instructions (Signed)

## 2023-10-27 NOTE — Progress Notes (Signed)
 Follow-Up Visit   Subjective  Jared Rice is a 89 y.o. male who presents for the following: Mohs of a Nodular Basal Cell Carcinoma of the right supratip of nose, referred by Dr. Felipe Horton. He is accompanied by his wife.   The patient presents for follow up from Mohs surgery for a BCC on the right medial cheek, treated on 10/13/23, repaired with linear closure. The patient has been bandaging the wound as directed. The endorse the following concerns: No questions or concerns at this time.  The following portions of the chart were reviewed this encounter and updated as appropriate: medications, allergies, medical history  Review of Systems:  No other skin or systemic complaints except as noted in HPI or Assessment and Plan.  Objective  Well appearing patient in no apparent distress; mood and affect are within normal limits.  A focused examination was performed of the following areas: Right supratip of nose Relevant physical exam findings are noted in the Assessment and Plan.    Right Nasal Sidewall Erythematous thin papules/macules with gritty scale.   Assessment & Plan   BASAL CELL CARCINOMA (BCC), UNSPECIFIED SITE Right supraTip of Nose Mohs surgery  Consent obtained: written  Anticoagulation: Is the patient taking prescription anticoagulant and/or aspirin prescribed/recommended by a physician? Yes   Was the anticoagulation regimen changed prior to Mohs? No    Anesthesia: Anesthesia method: local infiltration Local anesthetic: lidocaine  1% WITH epi  Procedure Details: Timeout: pre-procedure verification complete Procedure Prep: patient was prepped and draped in usual sterile fashion Prep type: chlorhexidine  Biopsy accession number: [id number] Biopsy lab: [facility] Frozen section biopsy performed: No   Specimen debulked: No   Pre-Op diagnosis: basal cell carcinoma BCC subtype: nodular MohsAIQ Surgical site (if tumor spans multiple areas, please select predominant area):  nose Surgery side: right Surgical site (from skin exam): Right supraTip of Nose Pre-operative length (cm): 0.4 Pre-operative width (cm): 0.4 Indications for Mohs surgery: anatomic location where tissue conservation is critical Previously treated? No    Micrographic Surgery Details: Post-operative length (cm): 2 Post-operative width (cm): 2.3 Number of Mohs stages: 6 Is this a complex case (associate members only): No    Stage 1    Tumor features identified on Mohs section: basal carcinoma    Other tumor features identified: superficial and infiltrative  Stage 2    Tumor features identified on Mohs section: basal carcinoma    Other tumor features identified: superficial and infiltrative  Stage 3    Tumor features identified on Mohs section: basal carcinoma    Other tumor features identified: Infiltrative and nodular  Stage 4    Tumor features identified on Mohs section: basal carcinoma    Other tumor features identified: nodular  Stage 5    Tumor features identified on Mohs section: basal carcinoma    Other tumor features identified: superficial  Stage 6    Tumor features identified on Mohs section: no tumor identified  Patient tolerance of procedure: tolerated well, no immediate complications  Reconstruction: Was the defect reconstructed? Yes   Was reconstruction performed by the same Mohs surgeon? Yes   Setting of reconstruction: outpatient office When was reconstruction performed? same day  Opioids: Did the patient receive a prescription for opioid/narcotic related to Mohs surgery? Yes   Indications for opioid/narcotics: patient required additional pain relief despite trial of non-opioid analgesia  Antibiotics: Does patient meet AHA guidelines for endocarditis?: No   Does patient meet AHA guidelines for orthopedic prophylaxis?: No   Did surgery breach  mucosa, expose cartilage/bone, involve an area of lymphedema/inflamed/infected tissue? No   Related  Medications oxyCODONE  (OXY IR/ROXICODONE ) 5 MG immediate release tablet Take 1 tablet (5 mg total) by mouth every 6 (six) hours as needed for up to 8 doses for severe pain (pain score 7-10). AK (ACTINIC KERATOSIS) Right Nasal Sidewall Destruction of lesion - Right Nasal Sidewall Complexity: simple   Destruction method: cryotherapy   Informed consent: discussed and consent obtained   Timeout:  patient name, date of birth, surgical site, and procedure verified Outcome: patient tolerated procedure well with no complications   Post-procedure details: wound care instructions given   SCAR    Healing s/p Mohs for [facility] on the right medial cheek, treated on 10/13/23, repaired with Linear closure. - Reassured that wound is healing well - No evidence of infection - No swelling, induration, purulence, dehiscence, or tenderness out of proportion to the clinical exam, see photo above - Discussed that scars take up to 12 months to mature from the date of surgery - Recommend SPF 30+ to scar daily to prevent purple color from UV exposure during scar maturation process - Discussed that erythema and raised appearance of scar will fade over the next 4-6 months - OK to start scar massage at 4-6 weeks post-op - Can consider silicone based products for scar healing starting at 6 weeks post-op  HISTORY OF BASAL CELL CARCINOMA OF THE SKIN - No evidence of recurrence today - Recommend regular full body skin exams - Recommend daily broad spectrum sunscreen SPF 30+ to sun-exposed areas, reapply every 2 hours as needed.  - Call if any new or changing lesions are noted between office visits     Return in about 2 weeks (around 11/10/2023) for wound check/suture removal.  I, Haig Levan, Surg Tech III, am acting as scribe for Deneise Finlay, MD.    10/27/2023  HISTORY OF PRESENT ILLNESS  Jared Rice is seen in consultation at the request of Dr. Felipe Horton for biopsy-proven Nodular Basal Cell Carcinoma on the  right nasal tip. They note that the area has been present for about 6 months increasing in size with time.  There is no history of previous treatment.  Reports no other new or changing lesions and has no other complaints today.  Medications and allergies: see patient chart.  Review of systems: Reviewed 8 systems and notable for the above skin cancer.  All other systems reviewed are unremarkable/negative, unless noted in the HPI. Past medical history, surgical history, family history, social history were also reviewed and are noted in the chart/questionnaire.    PHYSICAL EXAMINATION  General: Well-appearing, in no acute distress, alert and oriented x 4. Vitals reviewed in chart (if available).   Skin: Exam reveals a 0.4 x 0.4 cm erythematous papule and biopsy scar on the nasal tip. There are rhytids, telangiectasias, and lentigines, consistent with photodamage.   Biopsy report(s) reviewed, confirming the diagnosis.   ASSESSMENT  1) Nodular Basal Cell Carcinoma on the nasal tip 2) photodamage 3) solar lentigines   PLAN   1. Due to location, size, histology, or recurrence and the likelihood of subclinical extension as well as the need to conserve normal surrounding tissue, the patient was deemed acceptable for Mohs micrographic surgery (MMS).  The nature and purpose of the procedure, associated benefits and risks including recurrence and scarring, possible complications such as pain, infection, and bleeding, and alternative methods of treatment if appropriate were discussed with the patient during consent. The lesion location was  verified by the patient, by reviewing previous notes, pathology reports, and by photographs as well as angulation measurements if available.  Informed consent was reviewed and signed by the patient, and timeout was performed at 9:30 AM. See op note below.  2. For the photodamage and solar lentigines, sun protection discussed/information given on OTC sunscreens, and we  recommend continued regular follow-up with primary dermatologist every 6 months or sooner for any growing, bleeding, or changing lesions. 3. Prognosis and future surveillance discussed. 4. Letter with treatment outcome sent to referring provider. 5. Pain. acetaminophen /ibuprofen  MOHS MICROGRAPHIC SURGERY AND RECONSTRUCTION  Initial size:   0.4 x 0.4 cm Surgical defect/wound size: 2.3 x 2.0 cm Anesthesia:    0.33% lidocaine  with 1:200,000 epinephrine EBL:    <5 mL Complications:  None Repair type:   Burow's Graft (Full Thickness Skin Graft) SQ suture:   5-0 Vicryl Cutaneous suture:  5-0 Polyprolene Final size of the repair: 1.5 x 1.5 = 2.25 cm^2  Stages: 6  STAGE I: Anesthesia achieved with 0.5% lidocaine  with 1:200,000 epinephrine. ChloraPrep applied. 1 section(s) excised using Mohs technique (this includes total peripheral and deep tissue margin excision and evaluation with frozen sections, excised and interpreted by the same physician). The tumor was first debulked and then excised with an approx. 2 mm margin.  Hemostasis was achieved with electrocautery as needed.  The specimen was then oriented, subdivided/relaxed, inked, and processed using Mohs technique.    Frozen section analysis revealed a positive margin for islands of cells with peripheral palisading and a haphazard arrangement of the more central cells and thin cords or strands of basaloid tumor cells that deeply infiltrate the surrounding stroma, often with irregular, angulated edges, appearing as a permeating invasion pattern at the tumor margins; these strands are embedded within a dense, collagenous stroma, with less prominent peripheral palisading and retraction in the peripheral margin.    STAGE II: An additional 2 mm margin was excised.  Hemostasis was achieved with electrocautery as needed.  The specimen was then oriented, subdivided/relaxed, inked, and processed using Mohs technique.   Frozen section analysis revealed a  positive margin for islands of cells with peripheral palisading and a haphazard arrangement of the more central cells and thin cords or strands of basaloid tumor cells that deeply infiltrate the surrounding stroma, often with irregular, angulated edges, appearing as a permeating invasion pattern at the tumor margins; these strands are embedded within a dense, collagenous stroma, with less prominent peripheral palisading and retraction in the peripheral margin.  STAGE III: An additional 2 mm margin was excised.  Hemostasis was achieved with electrocautery as needed.  The specimen was then oriented, subdivided/relaxed, inked, and processed using Mohs technique.   Frozen section analysis revealed a positive margin for islands of cells with peripheral palisading and a haphazard arrangement of the more central cells and thin cords or strands of basaloid tumor cells that deeply infiltrate the surrounding stroma, often with irregular, angulated edges, appearing as a permeating invasion pattern at the tumor margins; these strands are embedded within a dense, collagenous stroma, with less prominent peripheral palisading and retraction in the peripheral margin.  STAGE IV: An additional 2 mm margin was excised.  Hemostasis was achieved with electrocautery as needed.  The specimen was then oriented, subdivided/relaxed, inked, and processed using Mohs technique.   Frozen section analysis revealed a positive margin for islands of cells with peripheral palisading and a haphazard arrangement of the more central cells and thin cords or strands of basaloid  tumor cells that deeply infiltrate the surrounding stroma, often with irregular, angulated edges, appearing as a permeating invasion pattern at the tumor margins; these strands are embedded within a dense, collagenous stroma, with less prominent peripheral palisading and retraction in the peripheral margin.  STAGE V: An additional 2 mm margin was excised.  Hemostasis was  achieved with electrocautery as needed.  The specimen was then oriented, subdivided/relaxed, inked, and processed using Mohs technique.   Frozen section analysis revealed a positive margin for islands of cells with peripheral palisading and a haphazard arrangement of the more central cells and thin cords or strands of basaloid tumor cells that deeply infiltrate the surrounding stroma, often with irregular, angulated edges, appearing as a permeating invasion pattern at the tumor margins; these strands are embedded within a dense, collagenous stroma, with less prominent peripheral palisading and retraction in the peripheral margin.  STAGE VI: An additional 2 mm margin was excised.  Hemostasis was achieved with electrocautery as needed.  The specimen was then oriented, subdivided/relaxed, inked, and processed using Mohs technique. Evaluation of slides by the Mohs surgeon revealed clear tumor margins.   Reconstruction  The patient underwent a Burow's graft to repair the Mohs surgical defect. The procedure was performed following Mohs micrographic surgery. The defect was meticulously measured, and a Burow's graft was designed to achieve optimal closure. The graft was carefully harvested from an adjacent area of healthy skin, ensuring adequate tissue for the repair. The graft was placed and sutured into position to cover the defect, with attention to tension-free closure. The area was dressed with a sterile bandage, and the patient was instructed on proper wound care and follow-up for potential complications, including graft rejection or infection. Dimensions 1.5 x 1.5 cm^2.    Documentation: I have reviewed the above documentation for accuracy and completeness, and I agree with the above.

## 2023-10-28 DIAGNOSIS — Z48817 Encounter for surgical aftercare following surgery on the skin and subcutaneous tissue: Secondary | ICD-10-CM | POA: Diagnosis not present

## 2023-10-28 DIAGNOSIS — I4891 Unspecified atrial fibrillation: Secondary | ICD-10-CM | POA: Diagnosis not present

## 2023-10-28 DIAGNOSIS — I110 Hypertensive heart disease with heart failure: Secondary | ICD-10-CM | POA: Diagnosis not present

## 2023-10-28 DIAGNOSIS — R2689 Other abnormalities of gait and mobility: Secondary | ICD-10-CM | POA: Diagnosis not present

## 2023-10-28 DIAGNOSIS — G3184 Mild cognitive impairment, so stated: Secondary | ICD-10-CM | POA: Diagnosis not present

## 2023-10-28 DIAGNOSIS — G250 Essential tremor: Secondary | ICD-10-CM | POA: Diagnosis not present

## 2023-10-28 DIAGNOSIS — I509 Heart failure, unspecified: Secondary | ICD-10-CM | POA: Diagnosis not present

## 2023-10-29 ENCOUNTER — Encounter: Payer: Self-pay | Admitting: Dermatology

## 2023-11-05 ENCOUNTER — Ambulatory Visit

## 2023-11-05 DIAGNOSIS — L209 Atopic dermatitis, unspecified: Secondary | ICD-10-CM | POA: Diagnosis not present

## 2023-11-05 NOTE — Progress Notes (Signed)
 Patient here today for Dupixent  injection for atopic dermatitis.    Dupixent  300mg  injected into right arm. Patient tolerated injection well.   LOT: [id number] EXP: 11/23/2025   Lisbeth Rides, RMA

## 2023-11-06 ENCOUNTER — Ambulatory Visit: Payer: Self-pay

## 2023-11-06 ENCOUNTER — Encounter: Payer: Self-pay | Admitting: Family Medicine

## 2023-11-06 NOTE — Telephone Encounter (Signed)
 Attempted patient to contact patient. Invalid number.              Copied From CRM [id number]. Reason for Triage: swelling due to water  retention. [facility] called in  to advise that patients legs are swelling everything else is clear.  HH is requesting to know how to PCP wants to address issue.   [phone] Oakley Bellman from [facility] requesting call back.

## 2023-11-11 ENCOUNTER — Ambulatory Visit: Admitting: Dermatology

## 2023-11-11 ENCOUNTER — Encounter: Payer: Self-pay | Admitting: Dermatology

## 2023-11-11 DIAGNOSIS — L905 Scar conditions and fibrosis of skin: Secondary | ICD-10-CM

## 2023-11-11 DIAGNOSIS — C4491 Basal cell carcinoma of skin, unspecified: Secondary | ICD-10-CM

## 2023-11-11 DIAGNOSIS — T1490XD Injury, unspecified, subsequent encounter: Secondary | ICD-10-CM

## 2023-11-11 DIAGNOSIS — Z85828 Personal history of other malignant neoplasm of skin: Secondary | ICD-10-CM | POA: Diagnosis not present

## 2023-11-11 NOTE — Patient Instructions (Signed)

## 2023-11-11 NOTE — Progress Notes (Signed)
   Follow Up Visit   Subjective  Jared Rice is a 89 y.o. male who presents for the following: follow up from Mohs surgery. Accompanied by family friend.   The patient presents for follow up from Mohs surgery for a BCC on the right supratip of nose, treated on 10/27/2023, repaired with a burrows graft. The patient has been bandaging the wound as directed. The endorse the following concerns: none  The following portions of the chart were reviewed this encounter and updated as appropriate: medications, allergies, medical history  Review of Systems:  No other skin or systemic complaints except as noted in HPI or Assessment and Plan.  Objective  Well appearing patient in no apparent distress; mood and affect are within normal limits.  A full examination was performed including  the nose. All findings within normal limits unless otherwise noted below.  Healing wound with mild erythema  Relevant physical exam findings are noted in the Assessment and Plan.    Assessment & Plan   Healing s/p Mohs for [facility], treated on the right supratip of nose, repaired with a burrows graft - Sutures removed today. - Debridement performed of crusted areas.. - Reassured that wound is healing well - No evidence of infection - No swelling, induration, purulence, dehiscence, or tenderness out of proportion to the clinical exam, see photo above - Ok to continue ointment daily to wound under a bandage until next follow up.  Scar s/p Mohs for [facility] on the right medial cheek, treated on 10/13/23, repaired with Linear closure. - Reassured that wound is healing well - No evidence of infection - No swelling, induration, purulence, dehiscence, or tenderness out of proportion to the clinical exam, see photo above - Discussed that scars take up to 12 months to mature from the date of surgery - Recommend SPF 30+ to scar daily to prevent purple color from UV exposure during scar maturation process - Discussed that erythema  and raised appearance of scar will fade over the next 4-6 months - OK to start scar massage at 4-6 weeks post-op - Can consider silicone based products for scar healing starting at 6 weeks post-op  HISTORY OF BASAL CELL CARCINOMA OF THE SKIN - No evidence of recurrence today - Recommend regular full body skin exams - Recommend daily broad spectrum sunscreen SPF 30+ to sun-exposed areas, reapply every 2 hours as needed.  - Call if any new or changing lesions are noted between office visits  Return in about 2 weeks (around 11/25/2023) for wound check.  Maurine Sovereign, RN, am acting as scribe for Deneise Finlay, MD .   Documentation: I have reviewed the above documentation for accuracy and completeness, and I agree with the above.  Deneise Finlay, MD

## 2023-11-12 ENCOUNTER — Ambulatory Visit: Admitting: Dermatology

## 2023-11-14 DIAGNOSIS — R7989 Other specified abnormal findings of blood chemistry: Secondary | ICD-10-CM | POA: Diagnosis not present

## 2023-11-15 LAB — TSH+T4F+T3FREE
Free T4: 1.03 ng/dL (ref 0.82–1.77)
T3, Free: 2.1 pg/mL (ref 2.0–4.4)
TSH: 5.58 u[IU]/mL — ABNORMAL HIGH (ref 0.450–4.500)

## 2023-11-17 ENCOUNTER — Ambulatory Visit: Payer: Self-pay | Admitting: Family Medicine

## 2023-11-17 DIAGNOSIS — E038 Other specified hypothyroidism: Secondary | ICD-10-CM

## 2023-11-18 ENCOUNTER — Telehealth: Payer: Self-pay

## 2023-11-18 NOTE — Telephone Encounter (Signed)
 Patient called to advise his Dupixent  is $1,200.  I advised pt when he comes for nurse injection tomorrow, we would give him some paperwork to fill out for Dupixent  [employer] to see if he qualifies for copay assistance./sh

## 2023-11-19 ENCOUNTER — Ambulatory Visit

## 2023-11-19 DIAGNOSIS — L209 Atopic dermatitis, unspecified: Secondary | ICD-10-CM

## 2023-11-19 NOTE — Progress Notes (Signed)
 Patient here today for Dupixent  injection for atopic dermatitis.    Dupixent  300mg  injected into left arm. Patient tolerated injection well.   LOT: [id number] EXP: 11/23/2025   Stefano Saba, RMA

## 2023-11-20 ENCOUNTER — Other Ambulatory Visit: Payer: Self-pay | Admitting: Family Medicine

## 2023-11-20 ENCOUNTER — Telehealth: Payer: Self-pay

## 2023-11-20 MED ORDER — LEVOTHYROXINE SODIUM 100 MCG PO TABS: 100.0000 ug | ORAL_TABLET | Freq: Every day | ORAL | 3 refills | Status: DC

## 2023-11-20 NOTE — Telephone Encounter (Signed)
 New dose sent to pharmacy for Levo 100mcg daily

## 2023-11-20 NOTE — Telephone Encounter (Signed)
 It looks like you want him on Levothyroxine  100. Is this correct? If so I will send in for you     Copied from CRM [id number]. Topic: Clinical - Prescription Issue >> Nov 20, 2023  3:17 PM Sophia H wrote: Reason for CRM: Spoke with patients daughter [name] Piety who is calling in on behalf of her father, states she was told that Dr. Lang would be calling in a new rx for Levothyroxine  100mg  and when she checked at the pharmacy they advised they have not received anything. I do not see this medication listed in patients chart, please advise. # [phone] [facility]    [id number] [name], [place] [phone]

## 2023-11-25 ENCOUNTER — Ambulatory Visit: Admitting: Dermatology

## 2023-11-26 ENCOUNTER — Ambulatory Visit: Admitting: Dermatology

## 2023-11-26 ENCOUNTER — Encounter: Payer: Self-pay | Admitting: Dermatology

## 2023-11-26 DIAGNOSIS — Z85828 Personal history of other malignant neoplasm of skin: Secondary | ICD-10-CM

## 2023-11-26 DIAGNOSIS — C4491 Basal cell carcinoma of skin, unspecified: Secondary | ICD-10-CM

## 2023-11-26 DIAGNOSIS — L929 Granulomatous disorder of the skin and subcutaneous tissue, unspecified: Secondary | ICD-10-CM | POA: Diagnosis not present

## 2023-11-26 DIAGNOSIS — L905 Scar conditions and fibrosis of skin: Secondary | ICD-10-CM | POA: Diagnosis not present

## 2023-11-26 DIAGNOSIS — T1490XD Injury, unspecified, subsequent encounter: Secondary | ICD-10-CM

## 2023-11-26 NOTE — Progress Notes (Addendum)
 Follow Up Visit   Subjective  Signe Jared Rice is a 89 y.o. male who presents for the following: follow up from Mohs surgery   The patient presents for follow up from Mohs surgery for a BCC on the right supratip of nose, treated on 10/27/23, repaired with burrows graft. The patient has been bandaging the wound as directed. The endorse the following concerns: No questions or concerns at this time.   The following portions of the chart were reviewed this encounter and updated as appropriate: medications, allergies, medical history  Review of Systems:  No other skin or systemic complaints except as noted in HPI or Assessment and Plan.  Objective  Well appearing patient in no apparent distress; mood and affect are within normal limits.  A full examination was performed including scalp, head, and face. All findings within normal limits unless otherwise noted below.  Healing wound with mild erythema  Relevant physical exam findings are noted in the Assessment and Plan.    Assessment & Plan   Healing Wound s/p Mohs for [facility], treated on 10/27/23, repaired with burrows graft - Reassured that wound is healing well - No evidence of infection - No swelling, induration, purulence, dehiscence, or tenderness out of proportion to the clinical exam, see photo above - Discussed that scars take up to 12 months to mature from the date of surgery - Recommend SPF 30+ to scar daily to prevent purple color from UV exposure during scar maturation process - Discussed that erythema and raised appearance of scar will fade over the next 4-6 months - OK to start scar massage at 4-6 weeks post-op - Can consider silicone based products for scar healing starting at 6 weeks post-op - Ok to continue ointment daily to wound under a bandage for another 10 days  Scar s/p Mohs for [facility] on the right medial cheek, treated on 10/13/23, repaired with Linear closure. - Reassured that wound is healing well - No evidence of  infection - No swelling, induration, purulence, dehiscence, or tenderness out of proportion to the clinical exam - Discussed that scars take up to 12 months to mature from the date of surgery - Recommend SPF 30+ to scar daily to prevent purple color from UV exposure during scar maturation process - Discussed that erythema and raised appearance of scar will fade over the next 4-6 months - OK to start scar massage at 4-6 weeks post-op - Can consider silicone based products for scar healing starting at 6 weeks post-op  Author Husk The patient was evaluated for delayed wound healing with evidence of excessive granulation tissue, consistent with proud flesh. I explained that this is a benign but overactive healing response, where the granulation tissue extends beyond the wound edges, potentially interfering with proper re-epithelialization. We discussed that while this is not dangerous, it can delay closure and may require minor intervention. Treatment options include chemical cauterization with silver  nitrate to reduce the excess tissue, as well as continued local wound care with non-adherent dressings and moisture balance. The importance of avoiding trauma or friction to the area was also reviewed. The patient was counseled on signs of infection and when to return for reassessment. All questions were addressed, and the patient expressed understanding of the diagnosis and plan.    HISTORY OF BASAL CELL CARCINOMA OF THE SKIN - No evidence of recurrence today - Recommend regular full body skin exams - Recommend daily broad spectrum sunscreen SPF 30+ to sun-exposed areas, reapply every 2 hours as needed.  -  Call if any new or changing lesions are noted between office visits  Return in about 3 weeks (around 12/17/2023) for Wound follow up.  I, Berwyn Lesches, Surg Tech III, am acting as scribe for RUFUS CHRISTELLA HOLY, MD.   Documentation: I have reviewed the above documentation for accuracy and completeness,  and I agree with the above.  RUFUS CHRISTELLA HOLY, MD

## 2023-11-26 NOTE — Patient Instructions (Signed)

## 2023-12-03 ENCOUNTER — Ambulatory Visit

## 2023-12-04 ENCOUNTER — Ambulatory Visit (INDEPENDENT_AMBULATORY_CARE_PROVIDER_SITE_OTHER)

## 2023-12-04 DIAGNOSIS — L209 Atopic dermatitis, unspecified: Secondary | ICD-10-CM | POA: Diagnosis not present

## 2023-12-04 NOTE — Progress Notes (Signed)
 Patient here today for Dupixent  injection for atopic dermatitis.    Dupixent  300mg  injected into right arm. Patient tolerated injection well.   LOT: [id number] EXP: 08/25/2025   Alan Pizza, RMA

## 2023-12-09 ENCOUNTER — Encounter: Payer: Self-pay | Admitting: Dermatology

## 2023-12-09 ENCOUNTER — Ambulatory Visit: Admitting: Dermatology

## 2023-12-09 DIAGNOSIS — Z85828 Personal history of other malignant neoplasm of skin: Secondary | ICD-10-CM | POA: Diagnosis not present

## 2023-12-09 DIAGNOSIS — R21 Rash and other nonspecific skin eruption: Secondary | ICD-10-CM | POA: Diagnosis not present

## 2023-12-09 DIAGNOSIS — L299 Pruritus, unspecified: Secondary | ICD-10-CM | POA: Diagnosis not present

## 2023-12-09 DIAGNOSIS — L209 Atopic dermatitis, unspecified: Secondary | ICD-10-CM | POA: Diagnosis not present

## 2023-12-09 DIAGNOSIS — W908XXA Exposure to other nonionizing radiation, initial encounter: Secondary | ICD-10-CM

## 2023-12-09 DIAGNOSIS — Z79899 Other long term (current) drug therapy: Secondary | ICD-10-CM

## 2023-12-09 DIAGNOSIS — L578 Other skin changes due to chronic exposure to nonionizing radiation: Secondary | ICD-10-CM

## 2023-12-09 DIAGNOSIS — L570 Actinic keratosis: Secondary | ICD-10-CM | POA: Diagnosis not present

## 2023-12-09 DIAGNOSIS — Z7189 Other specified counseling: Secondary | ICD-10-CM

## 2023-12-09 DIAGNOSIS — L820 Inflamed seborrheic keratosis: Secondary | ICD-10-CM

## 2023-12-09 NOTE — Progress Notes (Signed)
 Follow-Up Visit   Subjective  Signe Jared Rice is a 89 y.o. male who presents for the following: Atopic Dermatitis, 7m f/u, Dupixent  sq injections q 2 wks, still itching Hx BCC S/P recent MOHS x 2.  Patient accompanied by wife who contributes to history.  The following portions of the chart were reviewed this encounter and updated as appropriate: medications, allergies, medical history  Review of Systems:  No other skin or systemic complaints except as noted in HPI or Assessment and Plan.  Objective  Well appearing patient in no apparent distress; mood and affect are within normal limits.  A focused examination was performed of the following areas: Trunk, arms, face  Relevant exam findings are noted in the Assessment and Plan.  Right Upper Back Pinkness and scale upper back, chest   Assessment & Plan   ATOPIC DERMATITIS Severe with Pruritus, Improved but persistent  Started Dupixent  09/10/23 Trunk, extremities Exam: pinkness and scale upper back, chest 15% BSA Chronic and persistent condition with duration or expected duration over one year. Condition is symptomatic/ bothersome to patient. Not currently at goal.  Atopic dermatitis - Severe, on Dupixent  (biologic medication).  Atopic dermatitis (eczema) is a chronic, relapsing, pruritic condition that can significantly affect quality of life. It is often associated with allergic rhinitis and/or asthma and can require treatment with topical medications, phototherapy, or in severe cases biologic medications, which require long term medication management.   Treatment Plan: Hold 12/18/23 Dupixent  sq injection pending bx results  Recommend gentle skin care.   HISTORY OF BASAL CELL CARCINOMA OF THE SKIN - Recent Mohs x 2 - No evidence of recurrence today - Recommend regular full body skin exams - Recommend daily broad spectrum sunscreen SPF 30+ to sun-exposed areas, reapply every 2 hours as needed.  - Call if any new or changing  lesions are noted between office visits  - R supratip of nose, R medial cheek  ACTINIC DAMAGE - chronic, secondary to cumulative UV radiation exposure/sun exposure over time - diffuse scaly erythematous macules with underlying dyspigmentation - Recommend daily broad spectrum sunscreen SPF 30+ to sun-exposed areas, reapply every 2 hours as needed.  - Recommend staying in the shade or wearing long sleeves, sun glasses (UVA+UVB protection) and wide brim hats (4-inch brim around the entire circumference of the hat). - Call for new or changing lesions.  RASH Right Upper Back Skin / nail biopsy - Right Upper Back Type of biopsy: tangential   Informed consent: discussed and consent obtained   Timeout: patient name, date of birth, surgical site, and procedure verified   Procedure prep:  Patient was prepped and draped in usual sterile fashion Prep type:  Isopropyl alcohol Anesthesia: the lesion was anesthetized in a standard fashion   Anesthetic:  1% lidocaine  w/ epinephrine 1-100,000 buffered w/ 8.4% NaHCO3 Instrument used: flexible razor blade   Outcome: patient tolerated procedure well   Post-procedure details: sterile dressing applied and wound care instructions given   Dressing type: bandage and bacitracin    Specimen 1 - Surgical pathology Differential Diagnosis: Atopic Dermatitis vs other  Check Margins: No Pinkness and scale upper back, chest, some improvement since starting Dupixent  but persistent  ACTINIC SKIN DAMAGE   HISTORY OF BASAL CELL CARCINOMA   COUNSELING AND COORDINATION OF CARE   MEDICATION MANAGEMENT    Return for appointment pending bx results.  I, Grayce Saunas, RMA, am acting as scribe for Alm Rhyme, MD .   Documentation: I have reviewed the above documentation  for accuracy and completeness, and I agree with the above.  Alm Rhyme, MD

## 2023-12-09 NOTE — Patient Instructions (Signed)

## 2023-12-12 LAB — SURGICAL PATHOLOGY

## 2023-12-15 ENCOUNTER — Ambulatory Visit: Payer: Self-pay | Admitting: Dermatology

## 2023-12-15 NOTE — Telephone Encounter (Addendum)
 Tried calling patient regarding results. No answer. Lm for patient to return call. ----- Message from Alm Rhyme sent at 12/15/2023  5:12 PM EDT ----- FINAL DIAGNOSIS        1. Skin, right upper back, shave biopsy :       LICHENOID KERATOSIS   Benign Lichenoid Keratosis = inflamed keratosis May need to treat with LN2 Make appt in 4-8 weeks to re-check and treat ----- Message ----- From: Interface, Lab In Three Zero One Sent: 12/12/2023   5:25 PM EDT To: Alm JAYSON Rhyme, MD

## 2023-12-16 NOTE — Telephone Encounter (Signed)
 Patient's daughter advised of BX results and 8 week follow up scheduled.  Would you still like for patient to hold Dupixent  until he sees you at that time?

## 2023-12-16 NOTE — Telephone Encounter (Addendum)
 Tried calling patient. No answer. LM for patient to return call.  ----- Message from Alm Rhyme sent at 12/15/2023  5:12 PM EDT ----- FINAL DIAGNOSIS        1. Skin, right upper back, shave biopsy :       LICHENOID KERATOSIS   Benign Lichenoid Keratosis = inflamed keratosis May need to treat with LN2 Make appt in 4-8 weeks to re-check and treat ----- Message ----- From: Interface, Lab In Three Zero One Sent: 12/12/2023   5:25 PM EDT To: Alm JAYSON Rhyme, MD

## 2023-12-17 ENCOUNTER — Ambulatory Visit: Admitting: Dermatology

## 2023-12-17 ENCOUNTER — Encounter: Payer: Self-pay | Admitting: Dermatology

## 2023-12-17 VITALS — BP 118/72 | HR 80

## 2023-12-17 DIAGNOSIS — C4491 Basal cell carcinoma of skin, unspecified: Secondary | ICD-10-CM

## 2023-12-17 DIAGNOSIS — Z85828 Personal history of other malignant neoplasm of skin: Secondary | ICD-10-CM

## 2023-12-17 DIAGNOSIS — Z48817 Encounter for surgical aftercare following surgery on the skin and subcutaneous tissue: Secondary | ICD-10-CM

## 2023-12-17 DIAGNOSIS — L905 Scar conditions and fibrosis of skin: Secondary | ICD-10-CM

## 2023-12-17 NOTE — Patient Instructions (Signed)

## 2023-12-17 NOTE — Progress Notes (Signed)
   Follow Up Visit   Subjective  Signe LABOR Scahill is a 89 y.o. male who presents for the following: follow up from Mohs surgery   The patient presents for follow up from Mohs surgery for a BCC on the right supratip of nose, treated on 10/27/23, repaired with burrows graft. The patient has been bandaging the wound as directed. The endorse the following concerns: none  The following portions of the chart were reviewed this encounter and updated as appropriate: medications, allergies, medical history  Review of Systems:  No other skin or systemic complaints except as noted in HPI or Assessment and Plan.  Objective  Well appearing patient in no apparent distress; mood and affect are within normal limits.  A focal examination was performed including scalp, head, face . All findings within normal limits unless otherwise noted below.  Healing wound with mild erythema  Relevant physical exam findings are noted in the Assessment and Plan.    Assessment & Plan   Scar s/p Mohs for [facility] on the nose, treated on 10/27/23, repaired with burrows graft - Reassured that wound is healing well - No evidence of infection - No swelling, induration, purulence, dehiscence, or tenderness out of proportion to the clinical exam, see photo above - Discussed that scars take up to 12 months to mature from the date of surgery - Recommend SPF 30+ to scar daily to prevent purple color from UV exposure during scar maturation process - Discussed that erythema and raised appearance of scar will fade over the next 4-6 months  HISTORY OF BASAL CELL CARCINOMA OF THE SKIN - No evidence of recurrence today - Recommend regular full body skin exams - Recommend daily broad spectrum sunscreen SPF 30+ to sun-exposed areas, reapply every 2 hours as needed.  - Call if any new or changing lesions are noted between office visit  Return if symptoms worsen or fail to improve.  I, Darice Smock, CMA, am acting as scribe for RUFUS CHRISTELLA HOLY, MD.   Documentation: I have reviewed the above documentation for accuracy and completeness, and I agree with the above.  RUFUS CHRISTELLA HOLY, MD

## 2023-12-22 ENCOUNTER — Telehealth: Payer: Self-pay

## 2023-12-22 NOTE — Telephone Encounter (Signed)
 Patient aware and will call Dr. Hester

## 2023-12-22 NOTE — Telephone Encounter (Signed)
 Copied from CRM [id number]. Topic: Clinical - Medication Question >> Dec 22, 2023 12:34 PM Myrick T wrote: Reason for CRM: patient called stated the medication dupilumab  (DUPIXENT ) 300 MG/2ML prefilled syringe copay is $1200. Patient wants to know if provider could prescribe something cheaper for him.

## 2023-12-30 NOTE — Progress Notes (Unsigned)
 09/12/2022 2:29 PM    Jared Rice 10-04-1932 [id number]   Referring provider: Sharma Coyer, MD [address] [place],  [place] [address]   Urological history 1. BPH with LU TS -PSA (06/2022) 3.5  -Doxazosin  8 mg daily    2.  Right hydrocele -Right hydrocelectomy (2015)      Chief Complaint  Patient presents with   Benign Prostatic Hypertrophy      HPI: Jared Rice is a 89 y.o. male who presents today for yearly follow up with his wife, [name].  ***  Previous records reviewed.   Serum creatinine (08/2023) 1.19, eGFR 59  PVR ***      PMH:     Past Medical History:  Diagnosis Date   Actinic keratosis     Anginal pain     Aortic atherosclerosis     Arthritis     Atrial fibrillation     Basal cell carcinoma 02/06/2010    Left shoulder. Superficial.   Basal cell carcinoma 10/11/2013    Right medial forearm. Superficial   Basal cell carcinoma 04/10/2015    Right inf. lat. thigh. Superficial   Basal cell carcinoma 12/09/2016    Right cheek. Superficial.   Chronic airway obstruction, not elsewhere classified     Colon polyps     Complication of anesthesia      SPINAL DID NOT WORK FOR LAST KNEE REPLACEMENT AND HAD TO HAVE GA   Coronary atherosclerosis of native coronary artery      nonobstructive   Diabetes mellitus without complication     Essential hypertension, benign     GERD (gastroesophageal reflux disease)      H/O   Glucose intolerance (impaired glucose tolerance)     Heart murmur     History of kidney stones     Hyperlipidemia     Hypothyroidism     Iron deficiency anemia     Knee pain, right     LBBB (left bundle branch block)     Occlusion and stenosis of carotid artery without mention of cerebral infarction      wears compression stockings   Other dyspnea and respiratory abnormality      w/ pseuodowheeze resolves with purse lip manuever   Precordial pain     Shoulder pain, right     Squamous cell  carcinoma of skin 10/06/2007    Right forearm. SCCis   Squamous cell carcinoma of skin 10/11/2013    Left mid lat. pretibial. KA-like pattern.   Tremor     Unspecified sleep apnea      HAS NOT USED CPAP IN 20 YEARS   Venous insufficiency     Wears dentures      full upper   Wound cellulitis- right lower leg anterior  10/29/2019          Surgical History:      Past Surgical History:  Procedure Laterality Date   CARDIAC CATHETERIZATION        X2   COLONOSCOPY   03/17/12    Dr Byrnett-diverticulosis   ESOPHAGOGASTRODUODENOSCOPY (EGD) WITH PROPOFOL  N/A 11/25/2014    Procedure: ESOPHAGOGASTRODUODENOSCOPY (EGD) WITH PROPOFOL ;  Surgeon: Rogelia Copping, MD;  Location: [facility];  Service: Endoscopy;  Laterality: N/A;  with biopsy   ESOPHAGOGASTRODUODENOSCOPY (EGD) WITH PROPOFOL  N/A 01/05/2015    Procedure: ESOPHAGOGASTRODUODENOSCOPY (EGD) WITH PROPOFOL ;  Surgeon: Rogelia Copping, MD;  Location: [facility];  Service: Endoscopy;  Laterality: N/A;   EYE SURGERY Bilateral  cataract    KNEE ARTHROPLASTY Right 09/06/2020    Procedure: COMPUTER ASSISTED TOTAL KNEE ARTHROPLASTY;  Surgeon: Mardee Lynwood SQUIBB, MD;  Location: [facility];  Service: Orthopedics;  Laterality: Right;   TOTAL KNEE ARTHROPLASTY Left 2012          Home Medications:  Allergies as of 09/12/2022         Reactions    Dexilant [dexlansoprazole] Rash    Primidone Itching, Rash            Medication List           Accurate as of September 12, 2022  2:29 PM. If you have any questions, ask your nurse or doctor.              diphenhydramine -acetaminophen  25-500 MG Tabs tablet Commonly known as: TYLENOL  PM Take 2 tablets by mouth at bedtime.    doxazosin  8 MG tablet Commonly known as: CARDURA  Take 1 tablet by mouth once daily    Eliquis  5 MG Tabs tablet Generic drug: apixaban  Take 1 tablet by mouth twice a day    furosemide  40 MG tablet Commonly known as: LASIX  Take 1 tablet by mouth once daily  (dose change)    levothyroxine  88 MCG tablet Commonly known as: SYNTHROID  TAKE 1 TABLET BY MOUTH ONCE DAILY IN THE MORNING BEFORE BREAKFAST    ramipril  5 MG capsule Commonly known as: ALTACE  Take 1 capsule by mouth once daily    rosuvastatin  10 MG tablet Commonly known as: CRESTOR  Take 1 tablet by mouth once daily    topiramate  50 MG tablet Commonly known as: TOPAMAX  Take 1 tablet by mouth twice daily             Allergies:  Allergies      Allergies  Allergen Reactions   Dexilant [Dexlansoprazole] Rash   Primidone Itching and Rash        Family History:      Family History  Problem Relation Age of Onset   Heart failure Mother 108        congestive   Heart attack Father 28   Alcohol abuse Father     Alzheimer's disease Brother 41   Alzheimer's disease Sister 78   Colon cancer Neg Hx     Liver disease Neg Hx            Social History:  reports that he quit smoking about 41 years ago. His smoking use included cigarettes. He has a 62.00 pack-year smoking history. He has never used smokeless tobacco. He reports that he does not drink alcohol and does not use drugs.     Physical Exam: There were no vitals taken for this visit.  Constitutional:  Well nourished. Alert and oriented, No acute distress. HEENT: [place] AT, moist mucus membranes.  Trachea midline, no masses. Cardiovascular: No clubbing, cyanosis, or edema. Respiratory: Normal respiratory effort, no increased work of breathing. GI: Abdomen is soft, non tender, non distended, no abdominal masses. Liver and spleen not palpable.  No hernias appreciated.  Stool sample for occult testing is not indicated.   GU: No CVA tenderness.  No bladder fullness or masses.  Patient with circumcised/uncircumcised phallus. ***Foreskin easily retracted***  Urethral meatus is patent.  No penile discharge. No penile lesions or rashes. Scrotum without lesions, cysts, rashes and/or edema.  Testicles are located scrotally bilaterally.  No masses are appreciated in the testicles. Left and right epididymis are normal. Rectal: Patient with  normal sphincter tone. Anus and perineum without  scarring or rashes. No rectal masses are appreciated. Prostate is approximately *** grams, *** nodules are appreciated. Seminal vesicles are normal. Skin: No rashes, bruises or suspicious lesions. Lymph: No cervical or inguinal adenopathy. Neurologic: Grossly intact, no focal deficits, moving all 4 extremities. Psychiatric: Normal mood and affect.   Laboratory data: See EPIC and HPI I have reviewed the labs.     Pertinent imaging: ***   Assessment & Plan:     1. Benign prostatic hyperplasia with LUTS -Continue doxazosin  and finasteride  ***   2. Incomplete bladder emptying - his serum creatinine is stable *** -he is not having symptoms of retention  3. Right hydrocele -Large right hydrocele.  He has no bothersome symptoms   No follow-ups on file.    CLOTILDA HELON RIGGERS   [facility] [address] [place], [place] [address]

## 2023-12-31 ENCOUNTER — Ambulatory Visit: Admitting: Family Medicine

## 2023-12-31 ENCOUNTER — Encounter: Payer: Self-pay | Admitting: Urology

## 2023-12-31 ENCOUNTER — Ambulatory Visit: Admitting: Urology

## 2023-12-31 VITALS — BP 155/83 | HR 77 | Ht 74.0 in | Wt 240.0 lb

## 2023-12-31 DIAGNOSIS — N433 Hydrocele, unspecified: Secondary | ICD-10-CM | POA: Diagnosis not present

## 2023-12-31 DIAGNOSIS — R350 Frequency of micturition: Secondary | ICD-10-CM

## 2023-12-31 DIAGNOSIS — R339 Retention of urine, unspecified: Secondary | ICD-10-CM

## 2023-12-31 DIAGNOSIS — N401 Enlarged prostate with lower urinary tract symptoms: Secondary | ICD-10-CM

## 2023-12-31 LAB — URINALYSIS, COMPLETE
Bilirubin, UA: NEGATIVE
Glucose, UA: NEGATIVE
Ketones, UA: NEGATIVE
Leukocytes,UA: NEGATIVE
Nitrite, UA: NEGATIVE
Protein,UA: NEGATIVE
RBC, UA: NEGATIVE
Specific Gravity, UA: 1.02 (ref 1.005–1.030)
Urobilinogen, Ur: 0.2 mg/dL (ref 0.2–1.0)
pH, UA: 6 (ref 5.0–7.5)

## 2023-12-31 LAB — MICROSCOPIC EXAMINATION: Epithelial Cells (non renal): >10 /HPF — AB (ref 0–10)

## 2023-12-31 LAB — BLADDER SCAN AMB NON-IMAGING
Scan Result: 551
Scan Result: 753

## 2024-01-20 ENCOUNTER — Telehealth: Payer: Self-pay

## 2024-01-20 NOTE — Telephone Encounter (Signed)
 Called pt discussed with his daughter, he can disregard phone calls from [facility] pharmacy about Dupixent  costing $1,200 he does not need to get this rx

## 2024-01-28 ENCOUNTER — Ambulatory Visit (INDEPENDENT_AMBULATORY_CARE_PROVIDER_SITE_OTHER): Admitting: Family Medicine

## 2024-01-28 ENCOUNTER — Encounter: Payer: Self-pay | Admitting: Family Medicine

## 2024-01-28 VITALS — BP 122/73 | HR 60 | Resp 16 | Wt 253.2 lb

## 2024-01-28 DIAGNOSIS — E038 Other specified hypothyroidism: Secondary | ICD-10-CM

## 2024-01-28 DIAGNOSIS — G250 Essential tremor: Secondary | ICD-10-CM

## 2024-01-28 DIAGNOSIS — R413 Other amnesia: Secondary | ICD-10-CM

## 2024-01-28 DIAGNOSIS — I4891 Unspecified atrial fibrillation: Secondary | ICD-10-CM

## 2024-01-28 DIAGNOSIS — I10 Essential (primary) hypertension: Secondary | ICD-10-CM | POA: Diagnosis not present

## 2024-01-28 DIAGNOSIS — K219 Gastro-esophageal reflux disease without esophagitis: Secondary | ICD-10-CM | POA: Diagnosis not present

## 2024-01-28 DIAGNOSIS — E559 Vitamin D deficiency, unspecified: Secondary | ICD-10-CM | POA: Diagnosis not present

## 2024-01-28 NOTE — Progress Notes (Signed)
 Established patient visit   Patient: Jared Rice   DOB: 27-Jan-1933   89 y.o. Male  MRN: [id number] Visit Date: 01/28/2024  Today's healthcare provider: Rockie Agent, MD   Chief Complaint  Patient presents with   Medical Management of Chronic Issues    Follow-up HTN and thyroid  Flu Vaccine: patient declined today, will get it later   Subjective     HPI     Medical Management of Chronic Issues    Additional comments: Follow-up HTN and thyroid  Flu Vaccine: patient declined today, will get it later      Last edited by Rosas, Jared Rice, CMA on 01/28/2024  3:51 PM.       Discussed the use of AI scribe software for clinical note transcription with the patient, who gave verbal consent to proceed.  History of Present Illness Jared Rice is an 89 year old male with hypothyroidism who presents for follow-up on TSH and T4 levels. He is accompanied by his wife.  His TSH was last recorded at 5.5. His Synthroid  dosage was recently adjusted from 88 mcg to 100 mcg daily. He feels 'all right' and does not mention any new symptoms related to his thyroid  condition.  He has a history of atrial fibrillation and is currently taking Eliquis  5 mg twice daily. He mentions that his pulse feels regular and that his atrial fibrillation is not causing any noticeable issues at the moment.  He has chronic hypertension and is on Ramipril  5 mg every other day and Cardura  8 mg daily. He does not report any symptoms related to his hypertension.  He experiences chronic diarrhea and continues to use Metamucil as a treatment.  He has a history of GERD, vitamin D deficiency, coronary artery disease, and essential tremor. He does not report any current symptoms or issues related to these conditions during this visit.  He mentions some memory loss, particularly with short-term memory, but feels that overall he does well with memory. He recalls past events clearly and feels his  recent memory is 'pretty good'.  He acknowledges that he needs to elevate his legs more often at home to help reduce the swelling.  He uses a cane for mobility and reports using it whenever he is out of the car. He and his wife both drive, and they have two vehicles, though they mostly use his wife's car.     Past Medical History:  Diagnosis Date   Actinic keratosis    Anginal pain (HCC)    Aortic atherosclerosis (HCC)    Arthritis    Atrial fibrillation (HCC)    Basal cell carcinoma 02/06/2010   Left shoulder. Superficial.   Basal cell carcinoma 10/11/2013   Right medial forearm. Superficial   Basal cell carcinoma 04/10/2015   Right inf. lat. thigh. Superficial   Basal cell carcinoma 12/09/2016   Right cheek. Superficial.   Basal cell carcinoma 08/18/2023   Right Medial Cheek, 10/13/2023 mohs with Dr. Corey   Basal cell carcinoma 08/18/2023   Right Supratip of Nose, 10/27/23 mohs w/ Dr. Corey   Chronic airway obstruction, not elsewhere classified    Colon polyps    Complication of anesthesia    SPINAL DID NOT WORK FOR LAST KNEE REPLACEMENT AND HAD TO HAVE GA   Coronary atherosclerosis of native coronary artery    nonobstructive   Diabetes mellitus without complication (HCC)    Essential hypertension, benign    GERD (gastroesophageal reflux disease)  H/O   Glucose intolerance (impaired glucose tolerance)    Heart murmur    History of kidney stones    Hyperlipidemia    Hypothyroidism    Iron deficiency anemia    Knee pain, right    LBBB (left bundle branch block)    Occlusion and stenosis of carotid artery without mention of cerebral infarction    wears compression stockings   Other dyspnea and respiratory abnormality    w/ pseuodowheeze resolves with purse lip manuever   Precordial pain    Shoulder pain, right    Squamous cell carcinoma of skin 10/06/2007   Right forearm. SCCis   Squamous cell carcinoma of skin 10/11/2013   Left mid lat. pretibial. KA-like  pattern.   Squamous cell carcinoma of skin 09/24/2023   SCC IS,  Right Hand - Posterior, EDC   Tremor    Unspecified sleep apnea    HAS NOT USED CPAP IN 20 YEARS   Venous insufficiency    Wears dentures    full upper   Wound cellulitis- right lower leg anterior  10/29/2019    Medications: Outpatient Medications Prior to Visit  Medication Sig   apixaban  (ELIQUIS ) 5 MG TABS tablet Take 1 tablet (5 mg total) by mouth 2 (two) times daily.   cyanocobalamin  (VITAMIN B12) 1000 MCG tablet Take 1 tablet (1,000 mcg total) by mouth daily. (Patient taking differently: Take 1,000 mcg by mouth daily. occasional)   doxazosin  (CARDURA ) 8 MG tablet Take 1 tablet (8 mg total) by mouth daily.   dupilumab  (DUPIXENT ) 300 MG/2ML prefilled syringe Inject 300 mg into the skin every 14 (fourteen) days. Starting at day 15 for maintenance.   furosemide  (LASIX ) 40 MG tablet TAKE 1 TABLET BY MOUTH ONCE DAILY (DOSE CHANGE)   levothyroxine  (SYNTHROID ) 100 MCG tablet Take 1 tablet (100 mcg total) by mouth daily.   psyllium (METAMUCIL) 58.6 % packet Take 1 packet by mouth daily.   ramipril  (ALTACE ) 5 MG capsule TAKE 1 CAPSULE BY MOUTH EVERY DAY   rosuvastatin  (CRESTOR ) 10 MG tablet Take 1 tablet by mouth once daily   [DISCONTINUED] oxyCODONE  (OXY IR/ROXICODONE ) 5 MG immediate release tablet Take 1 tablet (5 mg total) by mouth every 6 (six) hours as needed for up to 8 doses for severe pain (pain score 7-10).   clobetasol  cream (TEMOVATE ) 0.05 % Apply twice a day until smooth. Avoid applying to face, groin, and axilla. (Patient not taking: Reported on 01/28/2024)   [DISCONTINUED] mupirocin  ointment (BACTROBAN ) 2 % Apply 1 Application topically 2 (two) times daily. (Patient not taking: Reported on 01/28/2024)   No facility-administered medications prior to visit.    Review of Systems  Last metabolic panel Lab Results  Component Value Date   GLUCOSE 78 09/23/2023   NA 141 09/23/2023   K 4.5 09/23/2023   CL 104  09/23/2023   CO2 24 09/23/2023   BUN 19 09/23/2023   CREATININE 1.19 09/23/2023   EGFR 59 (L) 09/23/2023   CALCIUM  8.7 09/23/2023   PHOS 3.3 12/25/2016   PROT 5.9 (L) 06/05/2022   ALBUMIN 3.9 06/05/2022   LABGLOB 2.0 06/05/2022   AGRATIO 2.0 06/05/2022   BILITOT 0.4 06/05/2022   ALKPHOS 65 06/05/2022   AST 6 06/05/2022   ALT 6 06/05/2022   ANIONGAP 7 02/23/2023   Last lipids Lab Results  Component Value Date   CHOL 122 06/05/2022   HDL 44 06/05/2022   LDLCALC 68 06/05/2022   TRIG 39 06/05/2022   CHOLHDL 2.8 06/05/2022  Last hemoglobin A1c Lab Results  Component Value Date   HGBA1C 5.9 (H) 06/05/2022   Last thyroid  functions Lab Results  Component Value Date   TSH 8.180 (H) 01/28/2024        Objective    BP 122/73 (BP Location: Right Arm, Patient Position: Sitting, Cuff Size: Normal)   Pulse 60   Resp 16   Wt 253 lb 3.2 oz (114.9 kg)   SpO2 99%   BMI 32.51 kg/m   BP Readings from Last 3 Encounters:  01/28/24 122/73  12/31/23 (!) 155/83  12/17/23 118/72   Wt Readings from Last 3 Encounters:  01/28/24 253 lb 3.2 oz (114.9 kg)  12/31/23 240 lb (108.9 kg)  09/23/23 249 lb (112.9 kg)        Physical Exam  Physical Exam VITALS: BP- 122/73 CHEST: Clear to auscultation bilaterally, no wheezes or crackles. CARDIOVASCULAR: Irregularly irregular rhythm with early beats, no murmurs. EXTREMITIES: 2+ bilateral lower extremity edema with venous stasis changes. SKIN: Well-healed scar from left upper extremity skin tear.    Results for orders placed or performed in visit on 01/28/24  TSH + free T4  Result Value Ref Range   TSH 8.180 (H) 0.450 - 4.500 uIU/mL   Free T4 0.97 0.82 - 1.77 ng/dL    Assessment & Plan     Problem List Items Addressed This Visit     Acid reflux   Atrial fibrillation (HCC)   Avitaminosis D   Benign essential tremor   HYPERTENSION, BENIGN - Primary   Hypothyroidism   Relevant Orders   TSH + free T4 (Completed)   Memory  loss, short term    Assessment and Plan Assessment & Plan Hypothyroidism Chronic condition  Suboptimal control with recent TSH level of 5.5. Recent adjustment of Synthroid  from 88 mcg to 100 mcg daily. - Continue Synthroid  100 mcg daily - Order TSH and T4 to assess thyroid  function  Atrial fibrillation Chronic condition   atrial fibrillation managed with anticoagulation. Cardiac exam reveals irregular rate with some early beats, no murmurs. Reports feeling well overall. - Continue Eliquis  5 mg twice daily  Essential hypertension Chronic condition  Well-controlled with current medication regimen. Blood pressure today is 100/73 mmHg. - Continue ramipril  5 mg every other day - Continue Cardura  8 mg daily  Hyperlipidemia Managed with statin therapy. - Continue Crestor  10 mg daily  Chronic diarrhea Managed with fiber supplementation. - Continue Metamucil  Essential tremor No reported worsening, perceives improvement.  Bilateral lower extremity edema with venous stasis changes Chronic  Reports not elevating legs as recommended. - Advise to elevate legs to reduce swelling - takes lasix  20mg  to help reduce swelling   Memory loss Reports doing well overall with memory, both recent and distant.  General Health Maintenance Declined flu vaccine today.  Follow-Up Plan to reassess and reset care in January. - Schedule follow-up appointment in January - Perform lab tests today to monitor thyroid  function     Return in about 4 months (around 05/29/2024) for Chronic F/U.         Jared Agent, MD  [facility] [phone] (phone) [phone] (fax)  [facility]

## 2024-01-29 LAB — TSH+FREE T4
Free T4: 0.97 ng/dL (ref 0.82–1.77)
TSH: 8.18 u[IU]/mL — ABNORMAL HIGH (ref 0.450–4.500)

## 2024-01-31 ENCOUNTER — Ambulatory Visit: Payer: Self-pay | Admitting: Family Medicine

## 2024-02-10 ENCOUNTER — Ambulatory Visit: Admitting: Dermatology

## 2024-02-21 ENCOUNTER — Other Ambulatory Visit: Payer: Self-pay | Admitting: Family Medicine

## 2024-02-25 NOTE — Telephone Encounter (Signed)
 Medication was not prescribed by me. Please refer prescription to patient's dermatologist office

## 2024-02-25 NOTE — Telephone Encounter (Unsigned)
 Copied from CRM #[id number]. Topic: Clinical - Medication Question >> Feb 25, 2024 12:12 PM Anairis L wrote: Reason for RMF:Ejupzwu is calling in because his copay for  dupilumab  (DUPIXENT ) 300 MG/2ML prefilled syringe was going to be $1263, he would like a new medication replacement that is more affordable.   Please advise. Pt would like a call back.    [id number] [name] [address] [place] [address] Phone: [phone] Fax: [phone] Hours: Not open 24 hours

## 2024-02-26 ENCOUNTER — Telehealth: Payer: Self-pay

## 2024-02-26 NOTE — Telephone Encounter (Signed)
 Reviewed

## 2024-02-26 NOTE — Telephone Encounter (Signed)
 Copied from CRM #[id number]. Topic: Clinical - Medication Question >> Feb 25, 2024  3:27 PM Deaijah H wrote: Reason for CRM: Patient called in to return call regarding medication Copay 1200. Spoke with daughter [name] and stated he does not need it, he got medication confused.

## 2024-03-03 ENCOUNTER — Ambulatory Visit

## 2024-03-03 ENCOUNTER — Ambulatory Visit: Admitting: Dermatology

## 2024-03-03 DIAGNOSIS — I872 Venous insufficiency (chronic) (peripheral): Secondary | ICD-10-CM | POA: Diagnosis not present

## 2024-03-03 MED ORDER — TRIAMCINOLONE ACETONIDE 0.1 % EX OINT: TOPICAL_OINTMENT | CUTANEOUS | 5 refills | Status: AC

## 2024-03-03 NOTE — Progress Notes (Signed)
    Subjective   Jared Rice is a 89 y.o. male who presents for the following: Rash. Patient is established patient   Today patient reports: Swelling of bilateral lower extremities.   Review of Systems:    No other skin or systemic complaints except as noted in HPI or Assessment and Plan.  The following portions of the chart were reviewed this encounter and updated as appropriate: medications, allergies, medical history  Relevant Medical History:  Personal history of non melanoma skin cancer - see medical history for full details   Objective  Well appearing patient in no apparent distress; mood and affect are within normal limits. Examination was performed of the: Bilateral lower extremities  Examination notable for: - Woody induration of lower legs with marked hyperpigmentation  No ulcerations Edema and weeping noted  Examination limited by: Shoes or socks  and Patient deferred removal       Assessment & Plan   Stasis dermatitis of bilateral lower extremities - Informed the patient that this condition is caused by swelling in his legs and reducing this swelling is the ultimate treatment - Encouraged to elevate their legs for 1 hour three times a day - Recommend compression stockings  - Start TAC 0.1% ointment to legs BID prn for erythema, pruritis, and scaling - Encouraged follow up w/PCP for management of underlying cardiac issues, diuretic dosing   Chronic and persistent condition with duration or expected duration over one year. Condition is symptomatic and bothersome to patient. Patient is flaring and not currently at treatment goal.  and Condition requires long term medication management.  Patient is using long term (months to years) prescription medication  to control their dermatologic condition.  These medications require periodic monitoring to evaluate for efficacy and side effects and may require periodic laboratory monitoring.    Procedures, orders, diagnosis for  this visit:  STASIS DERMATITIS    Stasis dermatitis  Other orders -     Triamcinolone  Acetonide; Apply 1 gram twice daily to affected areas of skin. Stop once resolved and restart as needed for flares. Avoid use on face, armpits, groin unless otherwise indicated.  Dispense: 30 g; Refill: 5    Return to clinic: Return if symptoms worsen or fail to improve.  Documentation: I have reviewed the above documentation for accuracy and completeness, and I agree with the above.  Lauraine JAYSON Kanaris, MD

## 2024-03-03 NOTE — Patient Instructions (Addendum)
 Steroid Use  We prescribed you a topical steroid at today's visit.   General application instructions: -Apply this to any affected skin areas, twice (2 times) daily, for two (2) weeks -If the areas are better, you can stop -Re-start the topical steroid if the areas come back, or flare -If the areas don't get better after two weeks, we sometimes recommend taking a break for one (1) week, before restarting for another two (2) weeks. Repeat as needed  The most common side effects of topical steroid medications include changes in skin pigment and thinning of the skin. If the steroid is only applied to affected areas of the skin, these effects rarely occur unless the steroid is used for a very long time (years without stopping).   If we prescribed you a strong steroid, please avoid applying to face, groin, or neck, unless we tell you otherwise. We will include more detail in your prescription instructions.    Due to recent changes in healthcare laws, you may see results of your pathology and/or laboratory studies on MyChart before the doctors have had a chance to review them. We understand that in some cases there may be results that are confusing or concerning to you. Please understand that not all results are received at the same time and often the doctors may need to interpret multiple results in order to provide you with the best plan of care or course of treatment. Therefore, we ask that you please give us  2 business days to thoroughly review all your results before contacting the office for clarification. Should we see a critical lab result, you will be contacted sooner.   If You Need Anything After Your Visit  If you have any questions or concerns for your doctor, please call our main line at [phone] and press option 4 to reach your doctor's medical assistant. If no one answers, please leave a voicemail as directed and we will return your call as soon as possible. Messages left after  4 pm will be answered the following business day.   You may also send us  a message via MyChart. We typically respond to MyChart messages within 1-2 business days.  For prescription refills, please ask your pharmacy to contact our office. Our fax number is [phone].  If you have an urgent issue when the clinic is closed that cannot wait until the next business day, you can page your doctor at the number below.    Please note that while we do our best to be available for urgent issues outside of office hours, we are not available 24/7.   If you have an urgent issue and are unable to reach us , you may choose to seek medical care at your doctor's office, retail clinic, urgent care center, or emergency room.  If you have a medical emergency, please immediately call 911 or go to the emergency department.  Pager Numbers  - Dr. Hester: [phone]  - Dr. Jackquline: [phone]  - Dr. Claudene: [phone]   - Dr. Raymund: [phone]  In the event of inclement weather, please call our main line at [phone] for an update on the status of any delays or closures.  Dermatology Medication Tips: Please keep the boxes that topical medications come in in order to help keep track of the instructions about where and how to use these. Pharmacies typically print the medication instructions only on the boxes and not directly on the medication tubes.   If your medication is too expensive, please contact our  office at [phone] option 4 or send us  a message through MyChart.   We are unable to tell what your co-pay for medications will be in advance as this is different depending on your insurance coverage. However, we may be able to find a substitute medication at lower cost or fill out paperwork to get insurance to cover a needed medication.   If a prior authorization is required to get your medication covered by your insurance company, please allow us  1-2 business days to complete this  process.  Drug prices often vary depending on where the prescription is filled and some pharmacies may offer cheaper prices.  The website www.goodrx.com contains coupons for medications through different pharmacies. The prices here do not account for what the cost may be with help from insurance (it may be cheaper with your insurance), but the website can give you the price if you did not use any insurance.  - You can print the associated coupon and take it with your prescription to the pharmacy.  - You may also stop by our office during regular business hours and pick up a GoodRx coupon card.  - If you need your prescription sent electronically to a different pharmacy, notify our office through [facility] or by phone at [phone] option 4.     Si Usted Necesita Algo Despus de Su Visita  Tambin puede enviarnos un mensaje a travs de Clinical cytogeneticist. Por lo general respondemos a los mensajes de MyChart en el transcurso de 1 a 2 das hbiles.  Para renovar recetas, por favor pida a su farmacia que se ponga en contacto con nuestra oficina. Randi lakes de fax es [place] [phone].  Si tiene un asunto urgente cuando la clnica est cerrada y que no puede esperar hasta el siguiente da hbil, puede llamar/localizar a su doctor(a) al nmero que aparece a continuacin.   Por favor, tenga en cuenta que aunque hacemos todo lo posible para estar disponibles para asuntos urgentes fuera del horario de [place], no estamos disponibles las 24 horas del da, los 7 [address]  [address] de la [place].   Si tiene un problema urgente y no puede comunicarse con nosotros, puede optar por buscar atencin mdica  en el consultorio de su doctor(a), en una clnica privada, en un centro de atencin urgente o en una sala de emergencias.  Si tiene Engineer, drilling, por favor llame inmediatamente al 911 o vaya a la sala de emergencias.  Nmeros de bper  - Dr. Hester: [phone]  - Dra. Jackquline: [phone]  - Dr.  Claudene: [phone]  - Dra. Kitts: [phone]  En caso de inclemencias del [place], por favor llame a nuestra lnea principal al [phone] para una actualizacin sobre el estado de cualquier retraso o cierre.  Consejos para la medicacin en dermatologa: Por favor, guarde las cajas en las que vienen los medicamentos de uso tpico para ayudarle a seguir las instrucciones sobre dnde y cmo usarlos. Las farmacias generalmente imprimen las instrucciones del medicamento slo en las cajas y no directamente en los tubos del [place].   Si su medicamento es muy caro, por favor, pngase en contacto con landry rieger llamando al [phone] y presione la opcin 4 o envenos un mensaje a travs de Clinical cytogeneticist.   No podemos decirle cul ser su copago por los medicamentos por adelantado ya que esto es diferente dependiendo de la cobertura de su seguro. Sin embargo, es posible que podamos encontrar un medicamento sustituto a Audiological scientist un formulario para que el  seguro cubra el medicamento que se considera necesario.   Si se requiere una autorizacin previa para que su compaa de seguros malta su medicamento, por favor permtanos de 1 a 2 das hbiles para completar este proceso.  Los precios de los medicamentos varan con frecuencia dependiendo del Environmental consultant de dnde se surte la receta y alguna farmacias pueden ofrecer precios ms baratos.  El sitio web www.goodrx.com tiene cupones para medicamentos de Health and safety inspector. Los precios aqu no tienen en cuenta lo que podra costar con la ayuda del seguro (puede ser ms barato con su seguro), pero el sitio web puede darle el precio si no utiliz Tourist information centre manager.  - Puede imprimir el cupn correspondiente y llevarlo con su receta a la farmacia.  - Tambin puede pasar por nuestra oficina durante el horario de atencin regular y Education officer, museum una tarjeta de cupones de GoodRx.  - Si necesita que su receta se enve electrnicamente a una farmacia diferente,  informe a nuestra oficina a travs de MyChart de [place] o por telfono llamando al [phone] y presione la opcin 4.

## 2024-03-12 DIAGNOSIS — B351 Tinea unguium: Secondary | ICD-10-CM | POA: Diagnosis not present

## 2024-03-12 DIAGNOSIS — M79674 Pain in right toe(s): Secondary | ICD-10-CM | POA: Diagnosis not present

## 2024-03-12 DIAGNOSIS — M79675 Pain in left toe(s): Secondary | ICD-10-CM | POA: Diagnosis not present

## 2024-03-25 NOTE — Telephone Encounter (Unsigned)
 Copied from CRM #[id number]. Topic: Clinical - Prescription Issue >> Mar 25, 2024 11:21 AM Jared Rice wrote: Reason for CRM: Patient is calling in because the pharmacy contacted him and said the copay for his medication would be $1400 and he cannot afford that. Patient wants to know if there is an alternative to dupilumab  (DUPIXENT ) 300 MG/2ML prefilled syringe [[id number]] that he can take instead.

## 2024-03-26 NOTE — Telephone Encounter (Signed)
 Please forward to patient's dermatologist. I have not managed this medication for MR. [place]ell

## 2024-03-29 ENCOUNTER — Other Ambulatory Visit: Payer: Self-pay | Admitting: Cardiology

## 2024-03-29 MED ORDER — APIXABAN 5 MG PO TABS: 5.0000 mg | ORAL_TABLET | Freq: Two times a day (BID) | ORAL | 3 refills | Status: DC

## 2024-03-29 NOTE — Telephone Encounter (Signed)
 Prescription refill request for Eliquis  received. Indication:afib Last office visit:2/25 Scr:1.19  4/25 Age: 89 Weight:114.9  kg  Prescription refilled

## 2024-03-30 ENCOUNTER — Telehealth: Payer: Self-pay

## 2024-03-30 NOTE — Telephone Encounter (Signed)
 Discussed with patient and appointment scheduled for 05/10/24 at 3:15 pm.

## 2024-03-30 NOTE — Telephone Encounter (Signed)
 Copied from CRM #[id number]. Topic: Clinical - Medication Question >> Mar 30, 2024  2:11 PM Amy B wrote: Reason for CRM: Patient's daughter requests a call back to discuss the patient's current medications.  She wants to weed through the ones he does not need to be taking and what he needs to have refilled.  Please call Jared Rice  [phone]

## 2024-03-31 NOTE — Telephone Encounter (Signed)
 Left message requesting a call back from [place].

## 2024-05-06 ENCOUNTER — Other Ambulatory Visit: Payer: Self-pay

## 2024-05-06 ENCOUNTER — Encounter: Payer: Self-pay | Admitting: Family Medicine

## 2024-05-06 DIAGNOSIS — I10 Essential (primary) hypertension: Secondary | ICD-10-CM

## 2024-05-06 MED ORDER — RAMIPRIL 5 MG PO CAPS: 5.0000 mg | ORAL_CAPSULE | Freq: Every day | ORAL | 0 refills | Status: DC

## 2024-05-10 ENCOUNTER — Ambulatory Visit: Admitting: Dermatology

## 2024-05-17 ENCOUNTER — Emergency Department

## 2024-05-17 ENCOUNTER — Inpatient Hospital Stay
Admission: EM | Admit: 2024-05-17 | Discharge: 2024-05-19 | DRG: 291 | Disposition: A | Discharge status: Home / Self Care

## 2024-05-17 ENCOUNTER — Other Ambulatory Visit: Payer: Self-pay

## 2024-05-17 ENCOUNTER — Inpatient Hospital Stay: Admit: 2024-05-17 | Discharge: 2024-05-17 | Disposition: A | Attending: Hospitalist

## 2024-05-17 DIAGNOSIS — Z79890 Hormone replacement therapy (postmenopausal): Secondary | ICD-10-CM

## 2024-05-17 DIAGNOSIS — I5023 Acute on chronic systolic (congestive) heart failure: Secondary | ICD-10-CM | POA: Diagnosis not present

## 2024-05-17 DIAGNOSIS — I5031 Acute diastolic (congestive) heart failure: Secondary | ICD-10-CM

## 2024-05-17 DIAGNOSIS — C8590 Non-Hodgkin lymphoma, unspecified, unspecified site: Secondary | ICD-10-CM | POA: Diagnosis present

## 2024-05-17 DIAGNOSIS — E119 Type 2 diabetes mellitus without complications: Secondary | ICD-10-CM | POA: Diagnosis present

## 2024-05-17 DIAGNOSIS — I4891 Unspecified atrial fibrillation: Secondary | ICD-10-CM | POA: Diagnosis present

## 2024-05-17 DIAGNOSIS — E785 Hyperlipidemia, unspecified: Secondary | ICD-10-CM | POA: Diagnosis present

## 2024-05-17 DIAGNOSIS — J9601 Acute respiratory failure with hypoxia: Principal | ICD-10-CM | POA: Diagnosis present

## 2024-05-17 DIAGNOSIS — Z96653 Presence of artificial knee joint, bilateral: Secondary | ICD-10-CM | POA: Diagnosis present

## 2024-05-17 DIAGNOSIS — I5033 Acute on chronic diastolic (congestive) heart failure: Secondary | ICD-10-CM | POA: Diagnosis present

## 2024-05-17 DIAGNOSIS — Z1152 Encounter for screening for COVID-19: Secondary | ICD-10-CM | POA: Diagnosis not present

## 2024-05-17 DIAGNOSIS — I872 Venous insufficiency (chronic) (peripheral): Secondary | ICD-10-CM | POA: Diagnosis present

## 2024-05-17 DIAGNOSIS — I2489 Other forms of acute ischemic heart disease: Secondary | ICD-10-CM | POA: Diagnosis present

## 2024-05-17 DIAGNOSIS — I878 Other specified disorders of veins: Secondary | ICD-10-CM | POA: Diagnosis present

## 2024-05-17 DIAGNOSIS — Z85828 Personal history of other malignant neoplasm of skin: Secondary | ICD-10-CM

## 2024-05-17 DIAGNOSIS — M7989 Other specified soft tissue disorders: Secondary | ICD-10-CM | POA: Diagnosis present

## 2024-05-17 DIAGNOSIS — I10 Essential (primary) hypertension: Secondary | ICD-10-CM | POA: Diagnosis not present

## 2024-05-17 DIAGNOSIS — I110 Hypertensive heart disease with heart failure: Secondary | ICD-10-CM | POA: Diagnosis present

## 2024-05-17 DIAGNOSIS — D649 Anemia, unspecified: Secondary | ICD-10-CM | POA: Diagnosis present

## 2024-05-17 DIAGNOSIS — G4730 Sleep apnea, unspecified: Secondary | ICD-10-CM | POA: Diagnosis present

## 2024-05-17 DIAGNOSIS — Z8249 Family history of ischemic heart disease and other diseases of the circulatory system: Secondary | ICD-10-CM

## 2024-05-17 DIAGNOSIS — I4821 Permanent atrial fibrillation: Secondary | ICD-10-CM | POA: Diagnosis present

## 2024-05-17 DIAGNOSIS — E039 Hypothyroidism, unspecified: Secondary | ICD-10-CM | POA: Diagnosis present

## 2024-05-17 DIAGNOSIS — Z87442 Personal history of urinary calculi: Secondary | ICD-10-CM

## 2024-05-17 DIAGNOSIS — Z7901 Long term (current) use of anticoagulants: Secondary | ICD-10-CM

## 2024-05-17 DIAGNOSIS — N179 Acute kidney failure, unspecified: Secondary | ICD-10-CM | POA: Diagnosis present

## 2024-05-17 DIAGNOSIS — Z79899 Other long term (current) drug therapy: Secondary | ICD-10-CM

## 2024-05-17 DIAGNOSIS — Z7984 Long term (current) use of oral hypoglycemic drugs: Secondary | ICD-10-CM

## 2024-05-17 DIAGNOSIS — I2510 Atherosclerotic heart disease of native coronary artery without angina pectoris: Secondary | ICD-10-CM | POA: Diagnosis present

## 2024-05-17 DIAGNOSIS — J449 Chronic obstructive pulmonary disease, unspecified: Secondary | ICD-10-CM | POA: Diagnosis present

## 2024-05-17 DIAGNOSIS — I509 Heart failure, unspecified: Secondary | ICD-10-CM

## 2024-05-17 DIAGNOSIS — Z888 Allergy status to other drugs, medicaments and biological substances status: Secondary | ICD-10-CM

## 2024-05-17 DIAGNOSIS — I447 Left bundle-branch block, unspecified: Secondary | ICD-10-CM | POA: Diagnosis present

## 2024-05-17 DIAGNOSIS — Z860100 Personal history of colon polyps, unspecified: Secondary | ICD-10-CM

## 2024-05-17 DIAGNOSIS — D696 Thrombocytopenia, unspecified: Secondary | ICD-10-CM | POA: Diagnosis present

## 2024-05-17 DIAGNOSIS — N5089 Other specified disorders of the male genital organs: Secondary | ICD-10-CM | POA: Diagnosis present

## 2024-05-17 DIAGNOSIS — I700 Atherosclerosis of aorta: Secondary | ICD-10-CM | POA: Diagnosis present

## 2024-05-17 DIAGNOSIS — Z87891 Personal history of nicotine dependence: Secondary | ICD-10-CM

## 2024-05-17 LAB — BASIC METABOLIC PANEL WITH GFR
Anion gap: 7 (ref 5–15)
BUN: 14 mg/dL (ref 8–23)
CO2: 30 mmol/L (ref 22–32)
Calcium: 8.6 mg/dL — ABNORMAL LOW (ref 8.9–10.3)
Chloride: 105 mmol/L (ref 98–111)
Creatinine, Ser: 0.93 mg/dL (ref 0.61–1.24)
GFR, Estimated: >60 mL/min
Glucose, Bld: 106 mg/dL — ABNORMAL HIGH (ref 70–99)
Potassium: 4.6 mmol/L (ref 3.5–5.1)
Sodium: 142 mmol/L (ref 135–145)

## 2024-05-17 LAB — RESP PANEL BY RT-PCR (RSV, FLU A&B, COVID)  RVPGX2
Influenza A by PCR: NEGATIVE
Influenza B by PCR: NEGATIVE
Resp Syncytial Virus by PCR: NEGATIVE
SARS Coronavirus 2 by RT PCR: NEGATIVE

## 2024-05-17 LAB — ECHOCARDIOGRAM COMPLETE
AR max vel: 1.71 cm2
AV Area VTI: 1.51 cm2
AV Area mean vel: 1.7 cm2
AV Mean grad: 5 mmHg
AV Peak grad: 8.4 mmHg
Ao pk vel: 1.45 m/s
Area-P 1/2: 3.31 cm2
Calc EF: 51.4 %
Height: 74 in
MV VTI: 1.59 cm2
S' Lateral: 2.8 cm
Single Plane A2C EF: 63.4 %
Single Plane A4C EF: 25.8 %
Weight: 3680 [oz_av]

## 2024-05-17 LAB — CBC
HCT: 33.4 % — ABNORMAL LOW (ref 39.0–52.0)
Hemoglobin: 10.3 g/dL — ABNORMAL LOW (ref 13.0–17.0)
MCH: 30.6 pg (ref 26.0–34.0)
MCHC: 30.8 g/dL (ref 30.0–36.0)
MCV: 99.1 fL (ref 80.0–100.0)
Platelets: 145 K/uL — ABNORMAL LOW (ref 150–400)
RBC: 3.37 MIL/uL — ABNORMAL LOW (ref 4.22–5.81)
RDW: 15 % (ref 11.5–15.5)
WBC: 6.1 K/uL (ref 4.0–10.5)
nRBC: 0 % (ref 0.0–0.2)

## 2024-05-17 LAB — TROPONIN T, HIGH SENSITIVITY
Troponin T High Sensitivity: 31 ng/L — ABNORMAL HIGH (ref 0–19)
Troponin T High Sensitivity: 27 ng/L — ABNORMAL HIGH (ref 0–19)

## 2024-05-17 LAB — CREATININE, SERUM
Creatinine, Ser: 0.99 mg/dL (ref 0.61–1.24)
GFR, Estimated: >60 mL/min

## 2024-05-17 LAB — PRO BRAIN NATRIURETIC PEPTIDE: Pro Brain Natriuretic Peptide: 2878 pg/mL — ABNORMAL HIGH

## 2024-05-17 MED ORDER — POLYETHYLENE GLYCOL 3350 17 G PO PACK: 17.0000 g | PACK | Freq: Every day | ORAL | Status: DC | PRN

## 2024-05-17 MED ORDER — LEVOTHYROXINE SODIUM 88 MCG PO TABS
88.0000 ug | ORAL_TABLET | Freq: Every morning | ORAL | Status: DC
  Administered 2024-05-17 – 2024-05-19 (×3): 88 ug via ORAL
  Filled 2024-05-17 (×4): qty 1

## 2024-05-17 MED ORDER — IPRATROPIUM-ALBUTEROL 0.5-2.5 (3) MG/3ML IN SOLN
3.0000 mL | Freq: Once | RESPIRATORY_TRACT | Status: AC
  Administered 2024-05-17: 3 mL via RESPIRATORY_TRACT
  Filled 2024-05-17: qty 3

## 2024-05-17 MED ORDER — ONDANSETRON HCL 4 MG PO TABS: 4.0000 mg | ORAL_TABLET | Freq: Four times a day (QID) | ORAL | Status: DC | PRN

## 2024-05-17 MED ORDER — IPRATROPIUM-ALBUTEROL 0.5-2.5 (3) MG/3ML IN SOLN: 3.0000 mL | Freq: Four times a day (QID) | RESPIRATORY_TRACT | Status: DC | PRN

## 2024-05-17 MED ORDER — FUROSEMIDE 10 MG/ML IJ SOLN
40.0000 mg | Freq: Two times a day (BID) | INTRAMUSCULAR | Status: DC
  Administered 2024-05-17 – 2024-05-18 (×3): 40 mg via INTRAVENOUS
  Filled 2024-05-17 (×3): qty 4

## 2024-05-17 MED ORDER — DOXAZOSIN MESYLATE 4 MG PO TABS
8.0000 mg | ORAL_TABLET | Freq: Every day | ORAL | Status: DC
  Administered 2024-05-17: 8 mg via ORAL
  Filled 2024-05-17: qty 2

## 2024-05-17 MED ORDER — PSYLLIUM 95 % PO PACK
1.0000 | PACK | Freq: Every day | ORAL | Status: DC
  Administered 2024-05-17 – 2024-05-18 (×2): 1 via ORAL
  Filled 2024-05-17 (×3): qty 1

## 2024-05-17 MED ORDER — SPIRONOLACTONE 25 MG PO TABS
25.0000 mg | ORAL_TABLET | Freq: Every day | ORAL | Status: DC
  Administered 2024-05-17 – 2024-05-19 (×3): 25 mg via ORAL
  Filled 2024-05-17 (×3): qty 1

## 2024-05-17 MED ORDER — ROSUVASTATIN CALCIUM 20 MG PO TABS
10.0000 mg | ORAL_TABLET | Freq: Every day | ORAL | Status: DC
  Administered 2024-05-17 – 2024-05-18 (×2): 10 mg via ORAL
  Filled 2024-05-17 (×3): qty 1

## 2024-05-17 MED ORDER — OXYCODONE HCL 5 MG PO TABS: 5.0000 mg | ORAL_TABLET | ORAL | Status: DC | PRN

## 2024-05-17 MED ORDER — DOXAZOSIN MESYLATE 4 MG PO TABS
4.0000 mg | ORAL_TABLET | Freq: Every day | ORAL | Status: DC
  Administered 2024-05-18: 4 mg via ORAL
  Filled 2024-05-17: qty 1

## 2024-05-17 MED ORDER — ENOXAPARIN SODIUM 40 MG/0.4ML IJ SOSY: 40.0000 mg | PREFILLED_SYRINGE | INTRAMUSCULAR | Status: DC

## 2024-05-17 MED ORDER — ONDANSETRON HCL 4 MG/2ML IJ SOLN: 4.0000 mg | Freq: Four times a day (QID) | INTRAMUSCULAR | Status: DC | PRN

## 2024-05-17 MED ORDER — MORPHINE SULFATE (PF) 2 MG/ML IV SOLN: 2.0000 mg | INTRAVENOUS | Status: DC | PRN

## 2024-05-17 MED ORDER — ACETAMINOPHEN 650 MG RE SUPP: 650.0000 mg | Freq: Four times a day (QID) | RECTAL | Status: DC | PRN

## 2024-05-17 MED ORDER — RAMIPRIL 5 MG PO CAPS
5.0000 mg | ORAL_CAPSULE | Freq: Every day | ORAL | Status: DC
  Administered 2024-05-17 – 2024-05-19 (×3): 5 mg via ORAL
  Filled 2024-05-17 (×3): qty 1

## 2024-05-17 MED ORDER — APIXABAN 5 MG PO TABS
5.0000 mg | ORAL_TABLET | Freq: Two times a day (BID) | ORAL | Status: DC
  Administered 2024-05-17 – 2024-05-19 (×5): 5 mg via ORAL
  Filled 2024-05-17 (×5): qty 1

## 2024-05-17 MED ORDER — ACETAMINOPHEN 325 MG PO TABS: 650.0000 mg | ORAL_TABLET | Freq: Four times a day (QID) | ORAL | Status: DC | PRN

## 2024-05-17 MED ORDER — FUROSEMIDE 10 MG/ML IJ SOLN
40.0000 mg | Freq: Once | INTRAMUSCULAR | Status: AC
  Administered 2024-05-17: 40 mg via INTRAVENOUS
  Filled 2024-05-17: qty 4

## 2024-05-17 NOTE — ED Provider Notes (Addendum)
 "  [facility]: [facility] Rhonda W Provider Note    Event Date/Time   First MD Initiated Contact with Patient 05/17/24 [phone]     (approximate)   History   Shortness of Breath  Pt c/o SOB that began last night. Pt has hx of afib. BLE swollen but pt reports no more than normal. Pt denies cough. Pt endorses orthopnea that is new. No known hx of CHF. Pt denies hx of COPD. Pt says he feels that he can't get a good deep breath. Pt takes eliquis .    HPI Jared Rice is a 89 y.o. male PMH CAD, bilateral lower extremity edema, hypertension, lymphoma, hyperlipidemia, A-fib on Eliquis  presents for evaluation of shortness of breath - Patient states he started feeling short of breath yesterday - No chest pain, fever, cough, abdominal pain - Has chronic lower extremity swelling which she says is similar to baseline - Accompanied by his wife who provides collateral - Not on home oxygen - He is taking all of his medications as prescribed including his Eliquis    Per chart review, outpatient visits note chronic bilateral lower extremity edema with venous stasis changes.  Does take Lasix  to help with swelling.  DVT ultrasounds negative in 2020 and 2018.  Appears patient did have an echocardiogram in 2020 with reportedly normal EF, grad I diastolic dysfunction, no significant valvular pathology.     Physical Exam   Triage Vital Signs: ED Triage Vitals [05/17/24 0712]  Encounter Vitals Group     BP (!) 167/110     Girls Systolic BP Percentile      Girls Diastolic BP Percentile      Boys Systolic BP Percentile      Boys Diastolic BP Percentile      Pulse Rate 69     Resp 20     Temp 98.2 F (36.8 C)     Temp Source Oral     SpO2 94 %     Weight 230 lb (104.3 kg)     Height 6' 2 (1.88 m)     Head Circumference      Peak Flow      Pain Score 0     Pain Loc      Pain Education      Exclude from Growth Chart     Most recent vital signs: Vitals:   05/17/24 0712 05/17/24 0815  BP:  (!) 167/110 (!) 175/105  Pulse: 69 68  Resp: 20 18  Temp: 98.2 F (36.8 C)   SpO2: 94% 95%   Patient satting 82% on room air at time of my evaluation.  Improved to mid 90s on 2 L nasal cannula.   General: Awake, no distress.  CV:  Good peripheral perfusion. RRR, RP 2+.  Notable bilateral lower extremity edema (stable per patient) Resp:  Mild tachypnea, speaking full sentences.  Somewhat diminished breath sounds throughout though no focal wheezing or coarse breath sounds appreciated. Abd:  No distention. Nontender to deep palpation throughout   ED Results / Procedures / Treatments   Labs (all labs ordered are listed, but only abnormal results are displayed) Labs Reviewed  BASIC METABOLIC PANEL WITH GFR - Abnormal; Notable for the following components:      Result Value   Glucose, Bld 106 (*)    Calcium  8.6 (*)    All other components within normal limits  CBC - Abnormal; Notable for the following components:   RBC 3.37 (*)    Hemoglobin 10.3 (*)  HCT 33.4 (*)    Platelets 145 (*)    All other components within normal limits  PRO BRAIN NATRIURETIC PEPTIDE - Abnormal; Notable for the following components:   Pro Brain Natriuretic Peptide 2,878.0 (*)    All other components within normal limits  TROPONIN T, HIGH SENSITIVITY - Abnormal; Notable for the following components:   Troponin T High Sensitivity 31 (*)    All other components within normal limits  RESP PANEL BY RT-PCR (RSV, FLU A&B, COVID)  [id number]     EKG  See ED course below   RADIOLOGY Radiology interpreted by myself radiology report review.  Notable for cardiomegaly, pleural effusions, vascular congestion.    PROCEDURES:  Critical Care performed: Yes, see critical care procedure note(s)  .Critical Care  Performed by: Clarine Ozell LABOR, MD Authorized by: Clarine Ozell LABOR, MD   Critical care provider statement:    Critical care time (minutes):  30   Critical care time was exclusive of:  Separately  billable procedures and treating other patients   Critical care was necessary to treat or prevent imminent or life-threatening deterioration of the following conditions:  Respiratory failure   Critical care was time spent personally by me on the following activities:  Development of treatment plan with patient or surrogate, discussions with consultants, evaluation of patient's response to treatment, examination of patient, ordering and review of laboratory studies, ordering and review of radiographic studies, ordering and performing treatments and interventions, pulse oximetry, re-evaluation of patient's condition and review of old charts   I assumed direction of critical care for this patient from another provider in my specialty: no     Care discussed with: admitting provider      MEDICATIONS ORDERED IN ED: Medications  ipratropium-albuterol  (DUONEB) 0.5-2.5 (3) MG/3ML nebulizer solution 3 mL (3 mLs Nebulization Given 05/17/24 0752)  furosemide  (LASIX ) injection 40 mg (40 mg Intravenous Given 05/17/24 0811)     IMPRESSION / MDM / ASSESSMENT AND PLAN / ED COURSE  I reviewed the triage vital signs and the nursing notes.                              DDX/MDM/AP: Differential diagnosis includes, but is not limited to, respiratory failure secondary to pulmonary edema/CHF, consider underlying viral syndrome including COVID-19 or influenza, pneumonia, doubt PE in this anticoagulated patient, consider obstructive lung disease, doubt ACS.  Somewhat hypertensive on arrival here, will continue to monitor, clinically doubt flash pulmonary edema at this time.  Plan: - Labs - Supplemental oxygen - chest x-ray  -EKG -DuoNeb, low threshold for diuresis -Monitor blood pressure, consider intervention if remains elevated - Anticipate admission  Patient's presentation is most consistent with acute presentation with potential threat to life or bodily function.  The patient is on the cardiac monitor to  evaluate for evidence of arrhythmia and/or significant heart rate changes.  ED course below.  Workup most consistent with new onset CHF exacerbation.  Was satting 82% on room air, improved to mid 90s on 2 L nasal cannula.  Initiating diuresis in emergency department.  Admitting to hospitalist service.  Viral swab pending.  Clinical Course as of 05/17/24 9161  Mon May 17, 2024  0725 Ecg = A-fib, rate 75, left bundle branch block present.  No clear evidence of ischemia on my interpretation.  Normal axis. [MM]  0740 Chest x-ray with some cardiomegaly and vascular congestion on my interpretation, formal radiology read pending [MM]  [phone] CBC with no leukocytosis, anemia within baseline range [MM]  0755 CXR: IMPRESSION: 1. Small bilateral pleural effusions and increased interstitial markings. 2. Cardiomegaly and atherosclerotic calcifications of aorta.   [MM]  0755 BNP notably elevated consistent with likely CHF exacerbation  Troponin mildly elevated, suspect secondary to above [MM]  0756 Lasix  and hospitalist consult placed [MM]    Clinical Course User Index [MM] Clarine Ozell LABOR, MD     FINAL CLINICAL IMPRESSION(S) / ED DIAGNOSES   Final diagnoses:  Acute respiratory failure with hypoxia (HCC)  Acute congestive heart failure, unspecified heart failure type [facility])     Rx / DC Orders   ED Discharge Orders     None        Note:  This document was prepared using Dragon voice recognition software and may include unintentional dictation errors.   Clarine Ozell LABOR, MD 05/17/24 9241    Clarine Ozell LABOR, MD 05/17/24 [phone]  "

## 2024-05-17 NOTE — H&P (Addendum)
 "  History and Physical    Jared Rice FMW:[id number] DOB: 1932-10-31 DOA: 05/17/2024  DOS: the patient was seen and examined on 05/17/2024  PCP: Sharma Coyer, MD   Patient coming from: Home  I have personally briefly reviewed patient's old medical records in [facility]  Chief Complaint: Shortness of breath started last night  HPI: Jared Rice is a pleasant 89 y.o. male with medical history significant for A-fib on Eliquis , bilateral lower extremity edema on diuretics, HTN, HLD, lymphoma and hypothyroidism came into ED complaining of shortness of breath that started last night around 9 PM while he was on lazy chair.  Patient is not on home oxygen.  Patient states that he has been on Lasix  for bilateral lower extremity swelling.  Last night he suddenly felt short of breath while sitting down on the chair to the point that he was not able to breathe.  Denies any fever, chills, nausea, vomiting, chest pain, palpitations.  ED Course: Upon arrival to the ED, patient is found to be hypertensive at 167/110 hemoglobin 10.3 hematocrit 33.4, proBNP 2878, troponin 31, EKG showed A-fib at 75 bpm, chest x-ray showed bilateral pleural effusions or increased interstitial markings.  Patient was negative for flu RSV and COVID.  Received a dose of 40 mg IV Lasix  in the emergency room and hospital service was consulted for evaluation for admission.  Review of Systems:  ROS  All other systems negative except as noted in the HPI.  Past Medical History:  Diagnosis Date   Actinic keratosis    Anginal pain    Aortic atherosclerosis    Arthritis    Atrial fibrillation (HCC)    Basal cell carcinoma 02/06/2010   Left shoulder. Superficial.   Basal cell carcinoma 10/11/2013   Right medial forearm. Superficial   Basal cell carcinoma 04/10/2015   Right inf. lat. thigh. Superficial   Basal cell carcinoma 12/09/2016   Right cheek. Superficial.   Basal cell carcinoma 08/18/2023   Right  Medial Cheek, 10/13/2023 mohs with Dr. Corey   Basal cell carcinoma 08/18/2023   Right Supratip of Nose, 10/27/23 m[facility]s w/ Dr. Corey   Chronic airway obstruction, not elsewhere classified    Colon polyps    Complication of anesthesia    SPINAL DID NOT WORK FOR LAST KNEE REPLACEMENT AND HAD TO HAVE GA   Coronary atherosclerosis of native coronary artery    nonobstructive   Diabetes mellitus without complication (HCC)    Essential hypertension, benign    GERD (gastroesophageal reflux disease)    H/O   Glucose intolerance (impaired glucose tolerance)    Heart murmur    History of kidney stones    Hyperlipidemia    Hypothyroidism    Iron deficiency anemia    Knee pain, right    LBBB (left bundle branch block)    Occlusion and stenosis of carotid artery without mention of cerebral infarction    wears compression stockings   Other dyspnea and respiratory abnormality    w/ pseuodowheeze resolves with purse lip manuever   Precordial pain    Shoulder pain, right    Squamous cell carcinoma of skin 10/06/2007   Right forearm. SCCis   Squamous cell carcinoma of skin 10/11/2013   Left mid lat. pretibial. KA-like pattern.   Squamous cell carcinoma of skin 09/24/2023   SCC IS,  Right Hand - Posterior, EDC   Tremor    Unspecified sleep apnea    HAS NOT USED CPAP IN 20 YEARS  Venous insufficiency    Wears dentures    full upper   Wound cellulitis- right lower leg anterior  10/29/2019    Past Surgical History:  Procedure Laterality Date   CARDIAC CATHETERIZATION     X2   COLONOSCOPY  03/17/12   Dr Byrnett-diverticulosis   ESOPHAGOGASTRODUODENOSCOPY (EGD) WITH PROPOFOL  N/A 11/25/2014   Procedure: ESOPHAGOGASTRODUODENOSCOPY (EGD) WITH PROPOFOL ;  Surgeon: Rogelia Copping, MD;  Location: [facility];  Service: Endoscopy;  Laterality: N/A;  with biopsy   ESOPHAGOGASTRODUODENOSCOPY (EGD) WITH PROPOFOL  N/A 01/05/2015   Procedure: ESOPHAGOGASTRODUODENOSCOPY (EGD) WITH PROPOFOL ;  Surgeon:  Rogelia Copping, MD;  Location: [facility];  Service: Endoscopy;  Laterality: N/A;   EYE SURGERY Bilateral    cataract    KNEE ARTHROPLASTY Right 09/06/2020   Procedure: COMPUTER ASSISTED TOTAL KNEE ARTHROPLASTY;  Surgeon: Mardee Lynwood SQUIBB, MD;  Location: [facility];  Service: Orthopedics;  Laterality: Right;   TOTAL KNEE ARTHROPLASTY Left 2012     reports that he quit smoking about 43 years ago. His smoking use included cigarettes. He started smoking about 74 years ago. He has a 62 pack-year smoking history. He has never used smokeless tobacco. He reports that he does not drink alcohol and does not use drugs.  Allergies[1]  Family History  Problem Relation Age of Onset   Heart failure Mother 8       congestive   Heart attack Father 60   Alcohol abuse Father    Alzheimer's disease Brother 57   Alzheimer's disease Sister 69   Colon cancer Neg Hx    Liver disease Neg Hx     Prior to Admission medications  Medication Sig Start Date End Date Taking? Authorizing Provider  apixaban  (ELIQUIS ) 5 MG TABS tablet Take 1 tablet (5 mg total) by mouth 2 (two) times daily. 03/29/24  Yes Jordan, Peter M, MD  doxazosin  (CARDURA ) 8 MG tablet TAKE 1 TABLET BY MOUTH EVERY DAY 02/23/24  Yes Simmons-Robinson, Makiera, MD  furosemide  (LASIX ) 40 MG tablet TAKE 1 TABLET BY MOUTH ONCE DAILY (DOSE CHANGE) 08/08/23  Yes Simmons-Robinson, Makiera, MD  levothyroxine  (SYNTHROID ) 88 MCG tablet Take 88 mcg by mouth every morning. 04/15/24  Yes [provider]  psyllium (METAMUCIL) 58.6 % packet Take 1 packet by mouth daily.   Yes [provider]  ramipril  (ALTACE ) 5 MG capsule Take 1 capsule (5 mg total) by mouth daily. 05/06/24  Yes Simmons-Robinson, Makiera, MD  rosuvastatin  (CRESTOR ) 10 MG tablet Take 1 tablet by mouth once daily 08/29/22  Yes Simmons-Robinson, Makiera, MD  clobetasol  cream (TEMOVATE ) 0.05 % Apply twice a day until smooth. Avoid applying to face, groin, and axilla. Patient not  taking: No sig reported 09/01/23   Simmons-Robinson, Rockie, MD  cyanocobalamin  (VITAMIN B12) 1000 MCG tablet Take 1 tablet (1,000 mcg total) by mouth daily. Patient not taking: Reported on 05/17/2024 10/15/22   [place], PA-C  dupilumab  (DUPIXENT ) 300 MG/2ML prefilled syringe Inject 300 mg into the skin every 14 (fourteen) days. Starting at day 15 for maintenance. Patient not taking: Reported on 05/17/2024 09/24/23   Hester Alm BROCKS, MD  triamcinolone  ointment (KENALOG ) 0.1 % Apply 1 gram twice daily to affected areas of skin. Stop once resolved and restart as needed for flares. Avoid use on face, armpits, groin unless otherwise indicated. Patient not taking: Reported on 05/17/2024 03/03/24   Raymund Lauraine BROCKS, MD    Physical Exam: Vitals:   05/17/24 1030 05/17/24 1100 05/17/24 1130 05/17/24 1200  BP: (!) 153/85 ROLLEN)  158/76 (!) 145/72 (!) 167/71  Pulse: 72 65  60  Resp: (!) 22 13 20  (!) 21  Temp:      TempSrc:      SpO2:  95%    Weight:      Height:        Physical Exam   Constitutional: Alert, awake, calm, comfortable HEENT: Neck supple Respiratory: Bilateral decreased air entry at the bases with  fine rales on the bases Cardiovascular: Regular rate and rhythm, no murmurs / rubs / gallops.  Bilateral 3+ pitting edema with chronic skin changes on lower extremity. 2+ pedal pulses. No carotid bruits.  Abdomen: Soft, no tenderness, Bowel sounds positive.  Musculoskeletal: no clubbing / cyanosis. Good ROM, no contractures. Normal muscle tone.  Skin: no rashes, lesions, ulcers. Neurologic: CN 2-12 grossly intact. Sensation intact, No focal deficit identified Psychiatric: Alert and oriented x 3. Normal mood.    Labs on Admission: I have personally reviewed following labs and imaging studies  CBC: Recent Labs  Lab 05/17/24 0714  WBC 6.1  HGB 10.3*  HCT 33.4*  MCV 99.1  PLT 145*   Basic Metabolic Panel: Recent Labs  Lab 05/17/24 0714 05/17/24 1030  NA 142  --   K 4.6  --    CL 105  --   CO2 30  --   GLUCOSE 106*  --   BUN 14  --   CREATININE 0.93 0.99  CALCIUM  8.6*  --    GFR: Estimated Creatinine Clearance: 65.1 mL/min (by C-G formula based on SCr of 0.99 mg/dL). Liver Function Tests: No results for input(s): AST, ALT, ALKPHOS, BILITOT, PROT, ALBUMIN in the last 168 hours. No results for input(s): LIPASE, AMYLASE in the last 168 hours. No results for input(s): AMMONIA in the last 168 hours. Coagulation Profile: No results for input(s): INR, PROTIME in the last 168 hours. Cardiac Enzymes: No results for input(s): CKTOTAL, CKMB, CKMBINDEX, TROPONINI, TROPONINIHS in the last 168 hours. BNP (last 3 results) No results for input(s): BNP in the last 8760 hours. HbA1C: No results for input(s): HGBA1C in the last 72 hours. CBG: No results for input(s): GLUCAP in the last 168 hours. Lipid Profile: No results for input(s): CHOL, HDL, LDLCALC, TRIG, CHOLHDL, LDLDIRECT in the last 72 hours. Thyroid  Function Tests: No results for input(s): TSH, T4TOTAL, FREET4, T3FREE, THYROIDAB in the last 72 hours. Anemia Panel: No results for input(s): VITAMINB12, FOLATE, FERRITIN, TIBC, IRON, RETICCTPCT in the last 72 hours. Urine analysis:    Component Value Date/Time   COLORURINE YELLOW (A) 02/23/2023 1623   APPEARANCEUR Clear 12/31/2023 1534   LABSPEC 1.017 02/23/2023 1623   PHURINE 5.0 02/23/2023 1623   GLUCOSEU Negative 12/31/2023 1534   HGBUR SMALL (A) 02/23/2023 1623   BILIRUBINUR Negative 12/31/2023 1534   KETONESUR NEGATIVE 02/23/2023 1623   PROTEINUR Negative 12/31/2023 1534   PROTEINUR NEGATIVE 02/23/2023 1623   UROBILINOGEN 4.0 (A) 07/01/2022 1427   NITRITE Negative 12/31/2023 1534   NITRITE POSITIVE (A) 02/23/2023 1623   LEUKOCYTESUR Negative 12/31/2023 1534   LEUKOCYTESUR SMALL (A) 02/23/2023 1623    Radiological Exams on Admission: I have personally reviewed images DG Chest  2 View Result Date: 05/17/2024 EXAM: 2 VIEW(S) XRAY OF THE CHEST 05/17/2024 07:29:07 AM COMPARISON: 02/23/2023 CLINICAL HISTORY: SOB FINDINGS: LUNGS AND PLEURA: Small bilateral pleural effusions. Increased interstitial markings. No focal pulmonary opacity. No pneumothorax. HEART AND MEDIASTINUM: Cardiomegaly. Atherosclerotic calcifications of aorta. BONES AND SOFT TISSUES: Multilevel degenerative changes of thoracic spine. No acute osseous  abnormality. IMPRESSION: 1. Small bilateral pleural effusions and increased interstitial markings. 2. Cardiomegaly and atherosclerotic calcifications of aorta. Electronically signed by: Evalene Coho MD 05/17/2024 07:40 AM EST RP Workstation: [id number]    EKG: My personal interpretation of EKG shows: A-fib with controlled response at 75 bpm    Assessment/Plan Principal Problem:   CHF (congestive heart failure) (HCC) Active Problems:   HYPERTENSION, BENIGN   CAD, NATIVE VESSEL   Sleep apnea   Left bundle branch block   HLD (hyperlipidemia)   Hypothyroidism   Lymphoma (HCC)   Atrial fibrillation (HCC)    Assessment and Plan: 89 year old male with multiple medical history including but not limited to HTN, HLD, hypothyroidism, lymphoma, CAD, left bundle branch block, A-fib on Eliquis  who came into ED complaining of acute onset of shortness of breath at rest and orthopnea.  1.  New onset of congestive heart failure versus acute on chronic diastolic CHF - May be acute on chronic as his 2020 echo shows diastolic dysfunction. - He will be admitted/telemetry for acute exacerbation of congestive heart failure. - He has mild elevated troponin likely due to demand ischemia.  Will defer to cardiology - He will be started on Lasix  40 mg twice daily. - He will be placed on CHF pathway - Echocardiogram will be ordered - Will give him oxygen to maintain saturation more than 90% - Will call cardiology for assistance  2.  A-fib with controlled response - Will  continue to monitor - He will be continued on Eliquis  but does not have any rate controlling medications  3.  Hypothyroidism - Resume home dose of levothyroxine  88 mcg daily  4.  HTN/HLD - Resume statin, ramipril  and doxazosin  - Continue to monitor blood pressure  5.  Deconditioning PT/OT      DVT prophylaxis: Eliquis  Code Status: Full Code Family Communication: Wife at bedside  Disposition Plan: Home  Consults called: Cardiology  Admission status: Inpatient, Telemetry bed   Nena Rebel, MD [facility] 05/17/2024, 12:49 PM        [1]  Allergies Allergen Reactions   Dexilant [Dexlansoprazole] Rash   Primidone Itching and Rash   "

## 2024-05-17 NOTE — ED Notes (Signed)
 CCMD called to place pt on cardiac monitoring.

## 2024-05-17 NOTE — ED Triage Notes (Addendum)
 Pt c/o SOB that began last night. Pt has hx of afib. BLE swollen but pt reports no more than normal. Pt denies cough. Pt endorses orthopnea that is new. No known hx of CHF. Pt denies hx of COPD. Pt says he feels that he can't get a good deep breath. Pt takes eliquis .

## 2024-05-17 NOTE — Consult Note (Signed)
 "  Cardiology Consultation:   Patient ID: Jared Rice; [id number]; 1932-09-09   Admit date: 05/17/2024 Date of Consult: 05/17/2024  Primary Care Provider: Sharma Coyer, Rice Primary Cardiologist: Jared Rice Primary Electrophysiologist:  None   Patient Profile:   Jared Rice is a 89 y.o. male with a hx of nonobstructive CAD by remote LHC, persistent, likely permanent A-fib, LBBB, COPD, carotid artery disease, chronic lower extremity swelling, thoracic aortic aneurysm measuring 4 cm by CTA in 2022, HTN, HLD, OSA noncompliant with CPAP, normocytic anemia, thrombocytopenia, hypothyroidism he who is being seen today for the evaluation of elevated troponin and volume overload at the request of Dr. Roann, Rice.  History of Present Illness:   Jared Rice has undergone multiple cardiac catheterizations in the remote past that have revealed nonobstructive disease.  Nuclear stress test in 2012 was normal.  Echo in 2013 showed an EF of 55 to 60%, mild LVH, grade 1 diastolic dysfunction, septal motion abnormality consistent with LBBB.  Regarding his A-fib, he has been asymptomatic and rate controlled over the years.  Most recent echo from 02/2019 at [facility] showed an EF greater than 55% with normal RV systolic function and mild tricuspid regurgitation.  He was admitted to [facility] on 05/17/2024 after sudden onset of shortness of breath with associated orthopnea on the evening of 05/16/2024.  Prior to this, he was in his usual state of health reporting no orthopnea with stable lower extremity swelling.  He does not weigh himself at home.  He reports he has had a several week history of scrotal swelling.  No early satiety.  No falls or symptoms concerning for bleeding.  Upon his arrival to [facility], he was found to be hypertensive with a BP of 167/110 and remained in rate controlled A-fib.  Oxygen saturation 94% trending to 89% requiring 2 L supplemental oxygen.  Afebrile.  Chest x-ray showed small bilateral  pleural effusions and increased interstitial markings.  Labs: Initial high-sensitivity troponin 31 with a delta troponin 27, proBNP 2878, Hgb 10.3, PLT 145, potassium 4.6, BUN 14, serum creatinine 0.93.  In the ER he received DuoNeb and IV Lasix  40 mg x 1 with vigorous urine output and some improvement in dyspnea.  At time of cardiology consult he is without symptoms of chest pain.  Dyspnea is improving though not back at baseline.  He remains on supplemental oxygen via nasal cannula at 2 L.    Past Medical History:  Diagnosis Date   Actinic keratosis    Anginal pain    Aortic atherosclerosis    Arthritis    Atrial fibrillation (HCC)    Basal cell carcinoma 02/06/2010   Left shoulder. Superficial.   Basal cell carcinoma 10/11/2013   Right medial forearm. Superficial   Basal cell carcinoma 04/10/2015   Right inf. lat. thigh. Superficial   Basal cell carcinoma 12/09/2016   Right cheek. Superficial.   Basal cell carcinoma 08/18/2023   Right Medial Cheek, 10/13/2023 mohs with Dr. Corey   Basal cell carcinoma 08/18/2023   Right Supratip of Nose, 10/27/23 mohs w/ Dr. Corey   Chronic airway obstruction, not elsewhere classified    Colon polyps    Complication of anesthesia    SPINAL DID NOT WORK FOR LAST KNEE REPLACEMENT AND HAD TO HAVE GA   Coronary atherosclerosis of native coronary artery    nonobstructive   Diabetes mellitus without complication (HCC)    Essential hypertension, benign    GERD (gastroesophageal reflux disease)    H/O   Glucose  intolerance (impaired glucose tolerance)    Heart murmur    History of kidney stones    Hyperlipidemia    Hypothyroidism    Iron deficiency anemia    Knee pain, right    LBBB (left bundle branch block)    Occlusion and stenosis of carotid artery without mention of cerebral infarction    wears compression stockings   Other dyspnea and respiratory abnormality    w/ pseuodowheeze resolves with purse lip manuever   Precordial pain     Shoulder pain, right    Squamous cell carcinoma of skin 10/06/2007   Right forearm. SCCis   Squamous cell carcinoma of skin 10/11/2013   Left mid lat. pretibial. KA-like pattern.   Squamous cell carcinoma of skin 09/24/2023   SCC IS,  Right Hand - Posterior, EDC   Tremor    Unspecified sleep apnea    HAS NOT USED CPAP IN 20 YEARS   Venous insufficiency    Wears dentures    full upper   Wound cellulitis- right lower leg anterior  10/29/2019    Past Surgical History:  Procedure Laterality Date   CARDIAC CATHETERIZATION     X2   COLONOSCOPY  03/17/12   Dr Jared Rice-diverticulosis   ESOPHAGOGASTRODUODENOSCOPY (EGD) WITH PROPOFOL  N/A 11/25/2014   Procedure: ESOPHAGOGASTRODUODENOSCOPY (EGD) WITH PROPOFOL ;  Surgeon: Jared Copping, Rice;  Location: [facility];  Service: Endoscopy;  Laterality: N/A;  with biopsy   ESOPHAGOGASTRODUODENOSCOPY (EGD) WITH PROPOFOL  N/A 01/05/2015   Procedure: ESOPHAGOGASTRODUODENOSCOPY (EGD) WITH PROPOFOL ;  Surgeon: Jared Copping, Rice;  Location: [facility];  Service: Endoscopy;  Laterality: N/A;   EYE SURGERY Bilateral    cataract    KNEE ARTHROPLASTY Right 09/06/2020   Procedure: COMPUTER ASSISTED TOTAL KNEE ARTHROPLASTY;  Surgeon: Jared Lynwood SQUIBB, Rice;  Location: [facility];  Service: Orthopedics;  Laterality: Right;   TOTAL KNEE ARTHROPLASTY Left 2012     Home Meds: Prior to Admission medications  Medication Sig Start Date End Date Taking? Authorizing Provider  apixaban  (ELIQUIS ) 5 MG TABS tablet Take 1 tablet (5 mg total) by mouth 2 (two) times daily. 03/29/24  Yes Jared Rice, Jared Rice  doxazosin  (CARDURA ) 8 MG tablet TAKE 1 TABLET BY MOUTH EVERY DAY 02/23/24  Yes Simmons-Robinson, Jared Rice  furosemide  (LASIX ) 40 MG tablet TAKE 1 TABLET BY MOUTH ONCE DAILY (DOSE CHANGE) 08/08/23  Yes Simmons-Robinson, Jared Rice  levothyroxine  (SYNTHROID ) 88 MCG tablet Take 88 mcg by mouth every morning. 04/15/24  Yes Provider, Historical, Rice  psyllium (METAMUCIL)  58.6 % packet Take 1 packet by mouth daily.   Yes Provider, Historical, Rice  ramipril  (ALTACE ) 5 MG capsule Take 1 capsule (5 mg total) by mouth daily. 05/06/24  Yes Simmons-Robinson, Jared Rice  rosuvastatin  (CRESTOR ) 10 MG tablet Take 1 tablet by mouth once daily 08/29/22  Yes Simmons-Robinson, Jared Rice  clobetasol  cream (TEMOVATE ) 0.05 % Apply twice a day until smooth. Avoid applying to face, groin, and axilla. Patient not taking: No sig reported 09/01/23   Simmons-Robinson, Rockie, Rice  cyanocobalamin  (VITAMIN B12) 1000 MCG tablet Take 1 tablet (1,000 mcg total) by mouth daily. Patient not taking: Reported on 05/17/2024 10/15/22   [place], PA-C  dupilumab  (DUPIXENT ) 300 MG/2ML prefilled syringe Inject 300 mg into the skin every 14 (fourteen) days. Starting at day 15 for maintenance. Patient not taking: Reported on 05/17/2024 09/24/23   Hester Alm BROCKS, Rice  triamcinolone  ointment (KENALOG ) 0.1 % Apply 1 gram twice daily to affected areas of skin. Stop once  resolved and restart as needed for flares. Avoid use on face, armpits, groin unless otherwise indicated. Patient not taking: Reported on 05/17/2024 03/03/24   Raymund Lauraine BROCKS, Rice    Inpatient Medications: Scheduled Meds:  apixaban   5 mg Oral BID   doxazosin   8 mg Oral Daily   furosemide   40 mg Intravenous BID   levothyroxine   88 mcg Oral q morning   psyllium  1 packet Oral Daily   ramipril   5 mg Oral Daily   rosuvastatin   10 mg Oral QHS   Continuous Infusions:  PRN Meds: acetaminophen  **OR** acetaminophen , ipratropium-albuterol , morphine  injection, ondansetron  **OR** ondansetron  (ZOFRAN ) IV, oxyCODONE , polyethylene glycol  Allergies:  Allergies[1]  Social History:   Social History   Socioeconomic History   Marital status: Married    Spouse name: [name]   Number of children: 2   Years of education: Not on file   Highest education level: Bachelor's degree (e.g., BA, AB, BS)  Occupational History   Occupation: retired     Comment: [employer]  Tobacco Use   Smoking status: Former    Current packs/day: 0.00    Average packs/day: 2.0 packs/day for 31.0 years (62.0 ttl pk-yrs)    Types: Cigarettes    Start date: 12/24/1949    Quit date: 12/24/1980    Years since quitting: 43.4   Smokeless tobacco: Never   Tobacco comments:    quit in 1982  Vaping Use   Vaping status: Never Used  Substance and Sexual Activity   Alcohol use: No    Alcohol/week: 0.0 standard drinks of alcohol   Drug use: No   Sexual activity: Not Currently  Other Topics Concern   Not on file  Social History Narrative   Lives with wife, retired Social Worker, 2 children, 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 (1 stepchild is deceased)   Social Drivers of Health   Tobacco Use: Medium Risk (03/12/2024)   Received from [facility]   Patient History    Smoking Tobacco Use: Former    Smokeless Tobacco Use: Never    Passive Exposure: Past  Physicist, Medical Strain: Low Risk  (03/12/2024)   Received from [facility]   Overall Financial Resource Strain (CARDIA)    Difficulty of Paying Living Expenses: Not hard at all  Food Insecurity: No Food Insecurity (03/12/2024)   Received from [facility]   Epic    Within the past 12 months, you worried that your food would run out before you got the money to buy more.: Never true    Within the past 12 months, the food you bought just didn't last and you didn't have money to get more.: Never true  Transportation Needs: No Transportation Needs (03/12/2024)   Received from [facility] - Transportation    In the past 12 months, has lack of transportation kept you from medical appointments or from getting medications?: No    Lack of Transportation (Non-Medical): No  Physical Activity: Inactive (08/06/2023)   Exercise Vital Sign    Days of Exercise per Week: 0 days    Minutes of Exercise per Session: 0 min  Stress:  No Stress Concern Present (08/06/2023)   [employer] of Occupational Health - Occupational Stress Questionnaire    Feeling of Stress : Not at all  Social Connections: Socially Integrated (08/06/2023)   Social Connection and Isolation Panel    Frequency of Communication with Friends and Family: More than three times a week  Frequency of Social Gatherings with Friends and Family: More than three times a week    Attends Religious Services: More than 4 times per year    Active Member of [employer] or Organizations: Yes    Attends Banker Meetings: Never    Marital Status: Married  Catering Manager Violence: Not At Risk (08/06/2023)   Humiliation, Afraid, Rape, and Kick questionnaire    Fear of Current or Ex-Partner: No    Emotionally Abused: No    Physically Abused: No    Sexually Abused: No  Depression (PHQ2-9): Low Risk (09/01/2023)   Depression (PHQ2-9)    PHQ-2 Score: 0  Alcohol Screen: Low Risk (08/06/2023)   Alcohol Screen    Last Alcohol Screening Score (AUDIT): 0  Housing: Low Risk  (03/12/2024)   Received from [facility]   Epic    In the last 12 months, was there a time when you were not able to pay the mortgage or rent on time?: No    In the past 12 months, how many times have you moved where you were living?: 0    At any time in the past 12 months, were you homeless or living in a shelter (including now)?: No  Utilities: Not At Risk (03/12/2024)   Received from [facility]   Epic    In the past 12 months has the electric, gas, oil, or water  company threatened to shut off services in your home?: No  Health Literacy: Adequate Health Literacy (08/06/2023)   B1300 Health Literacy    Frequency of need for help with medical instructions: Never     Family History:   Family History  Problem Relation Age of Onset   Heart failure Mother 25       congestive   Heart attack Father 2   Alcohol abuse Father    Alzheimer's disease  Brother 31   Alzheimer's disease Sister 71   Colon cancer Neg Hx    Liver disease Neg Hx     ROS:  Review of Systems  Constitutional:  Positive for malaise/fatigue. Negative for chills, diaphoresis, fever and weight loss.  HENT:  Negative for congestion.   Eyes:  Negative for discharge and redness.  Respiratory:  Positive for shortness of breath. Negative for cough, hemoptysis, sputum production and wheezing.   Cardiovascular:  Positive for orthopnea and leg swelling. Negative for chest pain, palpitations, claudication and PND.  Gastrointestinal:  Negative for abdominal pain, blood in stool, heartburn, melena, nausea and vomiting.  Genitourinary:  Negative for hematuria.  Musculoskeletal:  Negative for falls and myalgias.  Skin:  Negative for rash.  Neurological:  Positive for weakness. Negative for dizziness, tingling, tremors, sensory change, speech change, focal weakness and loss of consciousness.  Endo/Heme/Allergies:  Does not bruise/bleed easily.  Psychiatric/Behavioral:  Negative for substance abuse. The patient is not nervous/anxious.   All other systems reviewed and are negative.     Physical Exam/Data:   Vitals:   05/17/24 1230 05/17/24 1310 05/17/24 1315 05/17/24 1316  BP: (!) 156/79  135/80   Pulse:  70 71   Resp: (!) 24 20 (!) 24   Temp:    98.2 F (36.8 C)  TempSrc:    Oral  SpO2: 97% 97% 97%   Weight:      Height:        Intake/Output Summary (Last 24 hours) at 05/17/2024 1335 Last data filed at 05/17/2024 0920 Gross per 24 hour  Intake --  Output 250 ml  Net -250 ml   Filed Weights   05/17/24 9287  Weight: 104.3 kg   Body mass index is 29.53 kg/m.   Physical Exam: General: Well developed, well nourished, in no acute distress. Head: Normocephalic, atraumatic, sclera non-icteric, no xanthomas, nares without discharge.  Neck: Negative for carotid bruits. JVD mildly elevated. Lungs: Diminished breath sounds bilaterally, particularly at the bases.  Breathing is unlabored. Heart: IRIR with S1 S2. II/VI systolic murmur LUSB, no rubs, or gallops appreciated. Abdomen: Soft, non-tender, non-distended with normoactive bowel sounds. No hepatomegaly. No rebound/guarding. No obvious abdominal masses. Msk:  Strength and tone appear normal for age. Extremities: No clubbing or cyanosis.  1-2+ bilateral lower extremity pitting edema with chronic woody appearance and hyperpigmentation.  Neuro: Alert and oriented X 3. No facial asymmetry. No focal deficit. Moves all extremities spontaneously. Psych:  Responds to questions appropriately with a normal affect.   EKG:  The EKG was personally reviewed and demonstrates: A-fib, 75 bpm, LBBB, rare PVC versus aberrancy Telemetry:  Telemetry was personally reviewed and demonstrates: Afib with occasional PVCs versus aberrancy with underlying BBB  Weights: Filed Weights   05/17/24 0712  Weight: 104.3 kg    Relevant CV Studies:  2D echo 02/25/2019 [facility]): Summary   1. The left ventricle is normal in size with moderately increased wall  thickness.   2. Normal left ventricular systolic function, ejection fraction > 55%.    3. Normal right ventricular size and systolic function.    4. Tricuspid regurgitation - mild.  __________  2D echo 01/03/2012: - Left ventricle: The cavity size was mildly dilated. Wall    thickness was increased in a pattern of mild LVH. There    was mild focal basal hypertrophy of the septum. Systolic    function was normal. The estimated ejection fraction was    in the range of 55% to 60%. Wall motion was normal; there    were no regional wall motion abnormalities. Doppler    parameters are consistent with abnormal left ventricular    relaxation (grade 1 diastolic dysfunction).  - Ventricular septum: Septal motion showed abnormal function    and dyssynergy. These changes are consistent with a left    bundle branch block.  - Left atrium: The atrium was moderately dilated.  - Right  atrium: The atrium was mildly dilated.  - Pulmonary arteries: Systolic pressure was mildly    increased. PA peak pressure: 34mm Hg (S).   Laboratory Data:  Chemistry Recent Labs  Lab 05/17/24 0714 05/17/24 1030  NA 142  --   K 4.6  --   CL 105  --   CO2 30  --   GLUCOSE 106*  --   BUN 14  --   CREATININE 0.93 0.99  CALCIUM  8.6*  --   GFRNONAA >60 >60  ANIONGAP 7  --     No results for input(s): PROT, ALBUMIN, AST, ALT, ALKPHOS, BILITOT in the last 168 hours. Hematology Recent Labs  Lab 05/17/24 0714  WBC 6.1  RBC 3.37*  HGB 10.3*  HCT 33.4*  MCV 99.1  MCH 30.6  MCHC 30.8  RDW 15.0  PLT 145*   Cardiac EnzymesNo results for input(s): TROPONINI in the last 168 hours. No results for input(s): TROPIPOC in the last 168 hours.  BNP Recent Labs  Lab 05/17/24 0714  PROBNP 2,878.0*    DDimer No results for input(s): DDIMER in the last 168 hours.  Radiology/Studies:  DG Chest 2 View Result  Date: 05/17/2024 IMPRESSION: 1. Small bilateral pleural effusions and increased interstitial markings. 2. Cardiomegaly and atherosclerotic calcifications of aorta. Electronically signed by: Evalene Coho Rice 05/17/2024 07:40 AM EST RP Workstation: [id number]    Assessment and Plan:   1.  New onset CHF with chronic lower extremity edema: - Type and chronicity unclear at this time - Obtain echo to evaluate for new cardiomyopathy to help ascertain if this is new onset CHF versus acute on chronic HFpEF in the setting of accelerated hypertension and underlying A-fib - Remains volume up - Agree with IV Lasix  40 mg twice daily with KCl repletion as indicated - Daily weights and strict I's and O's - Escalate GDMT as able and indicated  - Would benefit from TED hose when able, leg elevation  2.  Persistent, likely permanent A-fib: - Ventricular rates well-controlled, not requiring AV nodal blocking medication - PTA apixaban  5 mg twice daily, does not meet reduced  dosing criteria - CHA2DS2-VASc at least 4 (HTN, age x 2, vascular disease)  3.  Accelerated hypertension: - Improving - Contributing to presentation - Trend after receiving PTA ramipril  and Cardura   4.  History of nonobstructive CAD with elevated high-sensitivity troponin: - Mildly elevated and downtrending, not consistent with ACS - Likely supply/demand ischemia in the setting of volume overload and underlying anemia - No indication for heparin  drip at this time - Obtain echo - Has been maintained on apixaban  in lieu of aspirin - PTA rosuvastatin   5.  Normocytic anemia with thrombocytopenia: - Stable - Monitor    For questions or updates, please contact [facility] Please consult [email or web address] for contact info under Cardiology/STEMI.   Signed, Bernardino Bring, PA-C  [facility] Pager: [phone] 05/17/2024, 1:35 PM        [1]  Allergies Allergen Reactions   Dexilant [Dexlansoprazole] Rash   Primidone Itching and Rash   "

## 2024-05-17 NOTE — ED Notes (Signed)
 Per MD, pt 82% on RA. MD placed pt on 2L [place].

## 2024-05-17 NOTE — ED Notes (Signed)
 Xray in triage and will take pt to room 1 after scan

## 2024-05-17 NOTE — ED Notes (Signed)
 Pt adjusted in bed. Pt's pants, underwear, socks, and shoes placed in belongings bag and given to spouse. Pt placed in brief and bed pad placed on bed. Pt adjusted in bed for comfort and fall precaution interventions in place at this time.

## 2024-05-18 ENCOUNTER — Encounter: Payer: Self-pay | Admitting: Hospitalist

## 2024-05-18 ENCOUNTER — Telehealth: Payer: Self-pay | Admitting: Cardiology

## 2024-05-18 DIAGNOSIS — I5031 Acute diastolic (congestive) heart failure: Secondary | ICD-10-CM | POA: Diagnosis not present

## 2024-05-18 DIAGNOSIS — I5033 Acute on chronic diastolic (congestive) heart failure: Secondary | ICD-10-CM | POA: Diagnosis not present

## 2024-05-18 LAB — COMPREHENSIVE METABOLIC PANEL WITH GFR
ALT: <5 U/L (ref 0–44)
AST: <10 U/L — ABNORMAL LOW (ref 15–41)
Albumin: 3.4 g/dL — ABNORMAL LOW (ref 3.5–5.0)
Alkaline Phosphatase: 63 U/L (ref 38–126)
Anion gap: 8 (ref 5–15)
BUN: 18 mg/dL (ref 8–23)
CO2: 32 mmol/L (ref 22–32)
Calcium: 8.3 mg/dL — ABNORMAL LOW (ref 8.9–10.3)
Chloride: 101 mmol/L (ref 98–111)
Creatinine, Ser: 1.12 mg/dL (ref 0.61–1.24)
GFR, Estimated: >60 mL/min
Glucose, Bld: 84 mg/dL (ref 70–99)
Potassium: 3.9 mmol/L (ref 3.5–5.1)
Sodium: 142 mmol/L (ref 135–145)
Total Bilirubin: 0.6 mg/dL (ref 0.0–1.2)
Total Protein: 5.5 g/dL — ABNORMAL LOW (ref 6.5–8.1)

## 2024-05-18 LAB — CBC
HCT: 29.6 % — ABNORMAL LOW (ref 39.0–52.0)
Hemoglobin: 9 g/dL — ABNORMAL LOW (ref 13.0–17.0)
MCH: 29.7 pg (ref 26.0–34.0)
MCHC: 30.4 g/dL (ref 30.0–36.0)
MCV: 97.7 fL (ref 80.0–100.0)
Platelets: 121 K/uL — ABNORMAL LOW (ref 150–400)
RBC: 3.03 MIL/uL — ABNORMAL LOW (ref 4.22–5.81)
RDW: 15.1 % (ref 11.5–15.5)
WBC: 4.5 K/uL (ref 4.0–10.5)
nRBC: 0 % (ref 0.0–0.2)

## 2024-05-18 LAB — PROTIME-INR
INR: 1.5 — ABNORMAL HIGH (ref 0.8–1.2)
Prothrombin Time: 18.5 s — ABNORMAL HIGH (ref 11.4–15.2)

## 2024-05-18 MED ORDER — EMPAGLIFLOZIN 10 MG PO TABS
10.0000 mg | ORAL_TABLET | Freq: Every day | ORAL | Status: DC
  Administered 2024-05-19: 10 mg via ORAL
  Filled 2024-05-18 (×3): qty 1

## 2024-05-18 MED ORDER — DIPHENHYDRAMINE HCL 25 MG PO CAPS
25.0000 mg | ORAL_CAPSULE | Freq: Every evening | ORAL | Status: DC | PRN
  Administered 2024-05-18: 25 mg via ORAL
  Filled 2024-05-18: qty 1

## 2024-05-18 NOTE — Progress Notes (Signed)
 "  Progress Note  Patient Name: Jared Rice Date of Encounter: 05/18/2024  Primary Cardiologist: Jordan  Subjective   Dyspnea improving, not back to baseline yet. Supplemental oxygen removed this morning. No chest pain. Documented UOP 1.4 L for the admission.   Inpatient Medications    Scheduled Meds:  apixaban   5 mg Oral BID   doxazosin   4 mg Oral Daily   furosemide   40 mg Intravenous BID   levothyroxine   88 mcg Oral q morning   psyllium  1 packet Oral Daily   ramipril   5 mg Oral Daily   rosuvastatin   10 mg Oral QHS   spironolactone   25 mg Oral Daily   Continuous Infusions:  PRN Meds: acetaminophen  **OR** acetaminophen , ipratropium-albuterol , morphine  injection, ondansetron  **OR** ondansetron  (ZOFRAN ) IV, oxyCODONE , polyethylene glycol   Vital Signs    Vitals:   05/18/24 0530 05/18/24 0600 05/18/24 0630 05/18/24 0700  BP: (!) 140/75 (!) 148/88 (!) 164/76 (!) 156/78  Pulse: 66 80 69 (!) 57  Resp: 17 (!) 25 14 19   Temp: 98 F (36.7 C)     TempSrc: Oral     SpO2: 98% 97% 95% 97%  Weight:      Height:        Intake/Output Summary (Last 24 hours) at 05/18/2024 0811 Last data filed at 05/18/2024 0654 Gross per 24 hour  Intake --  Output 1450 ml  Net -1450 ml   Filed Weights   05/17/24 9287  Weight: 104.3 kg    Telemetry    Afib with occasional PVCs vs aberrancy with artifact, 60s to 70s bpm - Personally Reviewed  ECG    No new tracings - Personally Reviewed  Physical Exam   GEN: No acute distress.   Neck: No JVD. Cardiac: IRIR, II/VI systolic murmur LUSB, no rubs, or gallops.  Respiratory: Improving, diminished breath sounds bilaterally.  GI: Soft, nontender, non-distended.   MS: 1+ bilateral lower extremity pitting edema with chronic woody appearance and hyperpigmentation ; No deformity. Neuro:  Alert and oriented x 3; Nonfocal.  Psych: Normal affect.  Labs    Chemistry Recent Labs  Lab 05/17/24 0714 05/17/24 1030 05/18/24 0502  NA  142  --  142  K 4.6  --  3.9  CL 105  --  101  CO2 30  --  32  GLUCOSE 106*  --  84  BUN 14  --  18  CREATININE 0.93 0.99 1.12  CALCIUM  8.6*  --  8.3*  PROT  --   --  5.5*  ALBUMIN  --   --  3.4*  AST  --   --  <10*  ALT  --   --  <5  ALKPHOS  --   --  63  BILITOT  --   --  0.6  GFRNONAA >60 >60 >60  ANIONGAP 7  --  8     Hematology Recent Labs  Lab 05/17/24 0714 05/18/24 0502  WBC 6.1 4.5  RBC 3.37* 3.03*  HGB 10.3* 9.0*  HCT 33.4* 29.6*  MCV 99.1 97.7  MCH 30.6 29.7  MCHC 30.8 30.4  RDW 15.0 15.1  PLT 145* 121*    Cardiac EnzymesNo results for input(s): TROPONINI in the last 168 hours. No results for input(s): TROPIPOC in the last 168 hours.   BNP Recent Labs  Lab 05/17/24 0714  PROBNP 2,878.0*     DDimer No results for input(s): DDIMER in the last 168 hours.   Radiology    DG  Chest 2 View Result Date: 05/17/2024 IMPRESSION: 1. Small bilateral pleural effusions and increased interstitial markings. 2. Cardiomegaly and atherosclerotic calcifications of aorta. Electronically signed by: Evalene Coho MD 05/17/2024 07:40 AM EST RP Workstation: [id number]    Cardiac Studies   2D echo 05/17/2024: 1. Left ventricular ejection fraction, by estimation, is 55 to 60%. The  left ventricle has normal function. The left ventricle has no regional  wall motion abnormalities. There is severe left ventricular hypertrophy.  Left ventricular diastolic parameters   are indeterminate.   2. Right ventricular systolic function is normal. The right ventricular  size is normal.   3. Left atrial size was severely dilated.   4. Right atrial size was moderately dilated.   5. The mitral valve is normal in structure. Mild mitral valve  regurgitation. No evidence of mitral stenosis. Moderate mitral annular  calcification.   6. The aortic valve is normal in structure. Aortic valve regurgitation is  not visualized. Aortic valve sclerosis/calcification is present, without   any evidence of aortic stenosis.   7. The inferior vena cava is normal in size with <50% respiratory  variability, suggesting right atrial pressure of 8 mmHg.  __________  2D echo 02/25/2019 [facility]): Summary   1. The left ventricle is normal in size with moderately increased wall  thickness.   2. Normal left ventricular systolic function, ejection fraction > 55%.    3. Normal right ventricular size and systolic function.    4. Tricuspid regurgitation - mild.  __________   2D echo 01/03/2012: - Left ventricle: The cavity size was mildly dilated. Wall    thickness was increased in a pattern of mild LVH. There    was mild focal basal hypertrophy of the septum. Systolic    function was normal. The estimated ejection fraction was    in the range of 55% to 60%. Wall motion was normal; there    were no regional wall motion abnormalities. Doppler    parameters are consistent with abnormal left ventricular    relaxation (grade 1 diastolic dysfunction).  - Ventricular septum: Septal motion showed abnormal function    and dyssynergy. These changes are consistent with a left    bundle branch block.  - Left atrium: The atrium was moderately dilated.  - Right atrium: The atrium was mildly dilated.  - Pulmonary arteries: Systolic pressure was mildly    increased. PA peak pressure: 34mm Hg (S).   Patient Profile     89 y.o. male with history of nonobstructive CAD by remote LHC, persistent, likely permanent A-fib, LBBB, COPD, carotid artery disease, chronic lower extremity swelling, thoracic aortic aneurysm measuring 4 cm by CTA in 2022, HTN, HLD, OSA noncompliant with CPAP, normocytic anemia, thrombocytopenia, hypothyroidism he who is being seen today for the evaluation of elevated troponin and volume overload at the request of Dr. Roann, MD.   Assessment & Plan    1. Acute HFpEFa: - Likely in the setting of uncontrolled BP and untreated sleep apnea - Remains volume up - Echo this admission  with preserved LVSF with severe LVH - Continue IV Lasix  40 mg twice daily with KCl repletion as indicated - Daily weights and strict I's and O's - Escalate GDMT as able  - Would benefit from TED hose when able, leg elevation   2.  Persistent, likely permanent A-fib: - Ventricular rates well-controlled, not requiring AV nodal blocking medication - PTA apixaban  5 mg twice daily, does not meet reduced dosing criteria - CHA2DS2-VASc at least 4 (  HTN, age x 2, vascular disease)   3.  Accelerated hypertension: - Improving - Contributing to presentation - Tapering Cardura  with heart failure - Continue PTA ramipril  and newly started spironolactone     4.  History of nonobstructive CAD with elevated high-sensitivity troponin: - Mildly elevated and downtrending, not consistent with ACS - Likely supply/demand ischemia in the setting of volume overload and underlying anemia - No indication for heparin  drip at this time - Echo with preserved LVSF - No plans for inpatient ischemic testing at this time - Has been maintained on apixaban  in lieu of aspirin - PTA rosuvastatin    5.  Normocytic anemia with thrombocytopenia: - Hgb low, though overall consistent with prior readings in 2024 - Monitor       For questions or updates, please contact [facility] Please consult [email or web address] for contact info under Cardiology/STEMI.    Signed, Bernardino Bring, PA-C [facility] Pager: [phone] 05/18/2024, 8:11 AM  "

## 2024-05-18 NOTE — Progress Notes (Addendum)
 " PROGRESS NOTE    Jared Rice  FMW:[id number] DOB: [birth year] DOA: 05/17/2024 PCP: Sharma Coyer, MD  Chief Complaint  Patient presents with   Shortness of Breath    Hospital Course:  Jared Rice is a pleasant 89 y.o. male with medical history significant for A-fib on Eliquis , bilateral lower extremity edema on diuretics, HTN, HLD, lymphoma and hypothyroidism presented to the ED due to SOB, not on home oxygen.  Workup revealed pulmonary vascular congestion, patient admitted for acute on chronic HFpEF, hospital course as below  Subjective: Patient was examined at the bedside, new to me today. Reports doing well, denies any complaints Called daughter Marlyce, provided updates and answered all questions   Objective: Vitals:   05/18/24 0630 05/18/24 0700 05/18/24 1000 05/18/24 1013  BP: (!) 164/76 (!) 156/78 120/63   Pulse: 69 (!) 57 81   Resp: 14 19 18    Temp:    97.9 F (36.6 C)  TempSrc:    Oral  SpO2: 95% 97% 94%   Weight:      Height:        Intake/Output Summary (Last 24 hours) at 05/18/2024 1027 Last data filed at 05/18/2024 0654 Gross per 24 hour  Intake --  Output 1200 ml  Net -1200 ml   Filed Weights   05/17/24 9287  Weight: 104.3 kg    Examination: Constitutional: Alert, awake, calm, comfortable HEENT: Neck supple Respiratory: Bilateral decreased air entry at the bases with  fine rales on the bases Cardiovascular: Regular rate and rhythm, no murmurs / rubs / gallops.  Bilateral 2+ pitting edema with chronic skin changes on lower extremity. 2+ pedal pulses. No carotid bruits.  Abdomen: Soft, no tenderness, Bowel sounds positive.  Musculoskeletal: no clubbing / cyanosis. Good ROM, no contractures. Normal muscle tone.  Skin: no rashes, lesions, ulcers. Neurologic: CN 2-12 grossly intact. Sensation intact, No focal deficit identified Psychiatric: Alert and oriented x 3. Normal mood  Assessment & Plan:  Acute diastolic CHF Likely in the  setting of uncontrolled hypertension and untreated sleep apnea. Ran out of lasix  for 1 month Echo shows EF 50 to 55%, no RWMA, severe LVH.  LA severely dilated.  Mild MR Continue IV Lasix  40 md bid, start spironolactone , Jardiance  Cardiology following, appreciate rec GDMT as tolerated Off supplemental oxygen  A-fib with controlled response Eliquis , rate controlled  Hx non obstructive CAD Elevated troponin likely due to demand ischemia On statin  Normocytic anemia Monitor Hb   Hypothyroidism  levothyroxine  88 mcg daily   HTN/HLD - Resume statin, ramipril  - Doxazosin  on hold, resume as BP tolerates   Deconditioning PT/OT   DVT prophylaxis: Eliquis    Code Status: Full Code Disposition:  TBD  Consultants:  Treatment Team:  Consulting Physician: Darron Deatrice DELENA, MD  Procedures:  None  Antimicrobials:  Anti-infectives (From admission, onward)    None       Data Reviewed: I have personally reviewed following labs and imaging studies CBC: Recent Labs  Lab 05/17/24 0714 05/18/24 0502  WBC 6.1 4.5  HGB 10.3* 9.0*  HCT 33.4* 29.6*  MCV 99.1 97.7  PLT 145* 121*   Basic Metabolic Panel: Recent Labs  Lab 05/17/24 0714 05/17/24 1030 05/18/24 0502  NA 142  --  142  K 4.6  --  3.9  CL 105  --  101  CO2 30  --  32  GLUCOSE 106*  --  84  BUN 14  --  18  CREATININE 0.93 0.99 1.12  CALCIUM  8.6*  --  8.3*   GFR: Estimated Creatinine Clearance: 57.6 mL/min (by C-G formula based on SCr of 1.12 mg/dL). Liver Function Tests: Recent Labs  Lab 05/18/24 0502  AST <10*  ALT <5  ALKPHOS 63  BILITOT 0.6  PROT 5.5*  ALBUMIN 3.4*   CBG: No results for input(s): GLUCAP in the last 168 hours.  Recent Results (from the past 240 hours)  Resp panel by RT-PCR (RSV, Flu A&B, Covid) Anterior Nasal Swab     Status: None   Collection Time: 05/17/24  7:52 AM   Specimen: Anterior Nasal Swab  Result Value Ref Range Status   SARS Coronavirus 2 by RT PCR NEGATIVE  NEGATIVE Final    Comment: (NOTE) SARS-CoV-2 target nucleic acids are NOT DETECTED.  The SARS-CoV-2 RNA is generally detectable in upper respiratory specimens during the acute phase of infection. The lowest concentration of SARS-CoV-2 viral copies this assay can detect is 138 copies/mL. A negative result does not preclude SARS-Cov-2 infection and should not be used as the sole basis for treatment or other patient management decisions. A negative result may occur with  improper specimen collection/handling, submission of specimen other than nasopharyngeal swab, presence of viral mutation(s) within the areas targeted by this assay, and inadequate number of viral copies(<138 copies/mL). A negative result must be combined with clinical observations, patient history, and epidemiological information. The expected result is Negative.  Fact Sheet for Patients:  bloggercourse.com  Fact Sheet for Healthcare Providers:  seriousbroker.it  This test is no t yet approved or cleared by the [place]  FDA and  has been authorized for detection and/or diagnosis of SARS-CoV-2 by FDA under an Emergency Use Authorization (EUA). This EUA will remain  in effect (meaning this test can be used) for the duration of the COVID-19 declaration under Section 564(b)(1) of the Act, 21 U.S.C.section 360bbb-3(b)(1), unless the authorization is terminated  or revoked sooner.       Influenza A by PCR NEGATIVE NEGATIVE Final   Influenza B by PCR NEGATIVE NEGATIVE Final    Comment: (NOTE) The Xpert Xpress SARS-CoV-2/FLU/RSV plus assay is intended as an aid in the diagnosis of influenza from Nasopharyngeal swab specimens and should not be used as a sole basis for treatment. Nasal washings and aspirates are unacceptable for Xpert Xpress SARS-CoV-2/FLU/RSV testing.  Fact Sheet for Patients: bloggercourse.com  Fact Sheet for Healthcare  Providers: seriousbroker.it  This test is not yet approved or cleared by the [place]  FDA and has been authorized for detection and/or diagnosis of SARS-CoV-2 by FDA under an Emergency Use Authorization (EUA). This EUA will remain in effect (meaning this test can be used) for the duration of the COVID-19 declaration under Section 564(b)(1) of the Act, 21 U.S.C. section 360bbb-3(b)(1), unless the authorization is terminated or revoked.     Resp Syncytial Virus by PCR NEGATIVE NEGATIVE Final    Comment: (NOTE) Fact Sheet for Patients: bloggercourse.com  Fact Sheet for Healthcare Providers: seriousbroker.it  This test is not yet approved or cleared by the [place]  FDA and has been authorized for detection and/or diagnosis of SARS-CoV-2 by FDA under an Emergency Use Authorization (EUA). This EUA will remain in effect (meaning this test can be used) for the duration of the COVID-19 declaration under Section 564(b)(1) of the Act, 21 U.S.C. section 360bbb-3(b)(1), unless the authorization is terminated or revoked.  Performed at [facility], [address], [place], [place] [address]      Radiology Studies: ECHOCARDIOGRAM COMPLETE Result  Date: 05/17/2024    ECHOCARDIOGRAM REPORT   Patient Name:   Jared Rice Date of Exam: 05/17/2024 Medical Rec #:  [id number]       Height:       74.0 in Accession #:    [id number]      Weight:       230.0 lb Date of Birth:  1932/08/15       BSA:          2.306 m Patient Age:    89 years        BP:           162/138 mmHg Patient Gender: M               HR:           69 bpm. Exam Location:  [facility] Procedure: 2D Echo, Cardiac Doppler and Color Doppler (Both Spectral and Color            Flow Doppler were utilized during procedure). Indications:     CHF-Acute Diastolic I50.31  History:         Patient has no prior history of Echocardiogram examinations.                   CHF.  Sonographer:     Ashley McNeely-Sloane Referring Phys:  [id number] [facility] PAUDEL Diagnosing Phys: Deatrice Cage MD IMPRESSIONS  1. Left ventricular ejection fraction, by estimation, is 55 to 60%. The left ventricle has normal function. The left ventricle has no regional wall motion abnormalities. There is severe left ventricular hypertrophy. Left ventricular diastolic parameters  are indeterminate.  2. Right ventricular systolic function is normal. The right ventricular size is normal.  3. Left atrial size was severely dilated.  4. Right atrial size was moderately dilated.  5. The mitral valve is normal in structure. Mild mitral valve regurgitation. No evidence of mitral stenosis. Moderate mitral annular calcification.  6. The aortic valve is normal in structure. Aortic valve regurgitation is not visualized. Aortic valve sclerosis/calcification is present, without any evidence of aortic stenosis.  7. The inferior vena cava is normal in size with <50% respiratory variability, suggesting right atrial pressure of 8 mmHg. FINDINGS  Left Ventricle: Left ventricular ejection fraction, by estimation, is 55 to 60%. The left ventricle has normal function. The left ventricle has no regional wall motion abnormalities. The left ventricular internal cavity size was normal in size. There is  severe left ventricular hypertrophy. Left ventricular diastolic parameters are indeterminate. Right Ventricle: The right ventricular size is normal. No increase in right ventricular wall thickness. Right ventricular systolic function is normal. Left Atrium: Left atrial size was severely dilated. Right Atrium: Right atrial size was moderately dilated. Pericardium: There is no evidence of pericardial effusion. Mitral Valve: The mitral valve is normal in structure. Moderate mitral annular calcification. Mild mitral valve regurgitation. No evidence of mitral valve stenosis. MV peak gradient, 6.2 mmHg. The mean mitral valve gradient is 2.0  mmHg. Tricuspid Valve: The tricuspid valve is normal in structure. Tricuspid valve regurgitation is mild . No evidence of tricuspid stenosis. Aortic Valve: The aortic valve is normal in structure. Aortic valve regurgitation is not visualized. Aortic valve sclerosis/calcification is present, without any evidence of aortic stenosis. Aortic valve mean gradient measures 5.0 mmHg. Aortic valve peak  gradient measures 8.4 mmHg. Aortic valve area, by VTI measures 1.51 cm. Pulmonic Valve: The pulmonic valve was normal in structure. Pulmonic valve regurgitation is mild. No evidence of pulmonic stenosis. Aorta:  The aortic root is normal in size and structure. Venous: The inferior vena cava is normal in size with less than 50% respiratory variability, suggesting right atrial pressure of 8 mmHg. IAS/Shunts: No atrial level shunt detected by color flow Doppler.  LEFT VENTRICLE PLAX 2D LVIDd:         4.60 cm      Diastology LVIDs:         2.80 cm      LV e' medial:    8.16 cm/s LV PW:         2.10 cm      LV E/e' medial:  15.7 LV IVS:        2.00 cm      LV e' lateral:   9.90 cm/s LVOT diam:     1.90 cm      LV E/e' lateral: 12.9 LV SV:         51 LV SV Index:   22 LVOT Area:     2.84 cm LV IVRT:       85 msec  LV Volumes (MOD) LV vol d, MOD A2C: 143.0 ml LV vol d, MOD A4C: 99.9 ml LV vol s, MOD A2C: 52.3 ml LV vol s, MOD A4C: 74.1 ml LV SV MOD A2C:     90.7 ml LV SV MOD A4C:     99.9 ml LV SV MOD BP:      65.8 ml RIGHT VENTRICLE             IVC RV Basal diam:  4.10 cm     IVC diam: 1.60 cm RV Mid diam:    3.50 cm RV S prime:     13.50 cm/s TAPSE (M-mode): 1.9 cm LEFT ATRIUM              Index        RIGHT ATRIUM           Index LA Vol (A2C):   127.0 ml 55.06 ml/m  RA Area:     27.30 cm LA Vol (A4C):   91.2 ml  39.54 ml/m  RA Volume:   88.70 ml  38.46 ml/m LA Biplane Vol: 108.0 ml 46.82 ml/m  AORTIC VALVE                    PULMONIC VALVE AV Area (Vmax):    1.71 cm     PV Vmax:        1.18 m/s AV Area (Vmean):   1.70 cm      PV Vmean:       79.900 cm/s AV Area (VTI):     1.51 cm     PV VTI:         0.255 m AV Vmax:           145.00 cm/s  PV Peak grad:   5.6 mmHg AV Vmean:          99.800 cm/s  PV Mean grad:   3.0 mmHg AV VTI:            0.339 m      RVOT Peak grad: 4 mmHg AV Peak Grad:      8.4 mmHg AV Mean Grad:      5.0 mmHg LVOT Vmax:         87.60 cm/s LVOT Vmean:        59.700 cm/s LVOT VTI:          0.180 m LVOT/AV VTI ratio: 0.53  AORTA Ao Root diam: 3.30 cm Ao Asc diam:  3.60 cm MITRAL VALVE MV Area (PHT): 3.31 cm     SHUNTS MV Area VTI:   1.59 cm     Systemic VTI:  0.18 m MV Peak grad:  6.2 mmHg     Systemic Diam: 1.90 cm MV Mean grad:  2.0 mmHg     Pulmonic VTI:  0.189 m MV Vmax:       1.24 m/s MV Vmean:      56.9 cm/s MV Decel Time: 229 msec MV E velocity: 128.00 cm/s Deatrice Cage MD Electronically signed by Deatrice Cage MD Signature Date/Time: 05/17/2024/3:23:45 PM    Final    DG Chest 2 View Result Date: 05/17/2024 EXAM: 2 VIEW(S) XRAY OF THE CHEST 05/17/2024 07:29:07 AM COMPARISON: 02/23/2023 CLINICAL HISTORY: SOB FINDINGS: LUNGS AND PLEURA: Small bilateral pleural effusions. Increased interstitial markings. No focal pulmonary opacity. No pneumothorax. HEART AND MEDIASTINUM: Cardiomegaly. Atherosclerotic calcifications of aorta. BONES AND SOFT TISSUES: Multilevel degenerative changes of thoracic spine. No acute osseous abnormality. IMPRESSION: 1. Small bilateral pleural effusions and increased interstitial markings. 2. Cardiomegaly and atherosclerotic calcifications of aorta. Electronically signed by: Evalene Coho MD 05/17/2024 07:40 AM EST RP Workstation: [id number]    Scheduled Meds:  apixaban   5 mg Oral BID   doxazosin   4 mg Oral Daily   furosemide   40 mg Intravenous BID   levothyroxine   88 mcg Oral q morning   psyllium  1 packet Oral Daily   ramipril   5 mg Oral Daily   rosuvastatin   10 mg Oral QHS   spironolactone   25 mg Oral Daily   Continuous Infusions:   LOS: 1 day  MDM: Patient is  high risk for one or more organ failure.  They necessitate ongoing hospitalization for continued IV therapies and subsequent lab monitoring. Total time spent interpreting labs and vitals, reviewing the medical record, coordinating care amongst consultants and care team members, directly assessing and discussing care with the patient and/or family: 55 min Laree Lock, MD [facility]  To contact the attending physician between 7A-7P please use Epic Chat. To contact the covering physician during after hours 7P-7A, please review Amion.  05/18/2024, 10:27 AM   *This document has been created with the assistance of dictation software. Please excuse typographical errors. *   "

## 2024-05-18 NOTE — Progress Notes (Signed)
 Heart Failure Nurse Navigator Progress Note  PCP: Simmons-Robinson, Rockie, MD PCP-Cardiologist: Jordan Admission Diagnosis: Acute respiratory failure with hypoxia (HCC) Acute congestive heart failure, unspecified heart failure type [facility])  Admitted from: Home  Presentation:   Jared Rice is a 89 y.o male who presented with shortness of breath.  Patient has a history of Atrial Fibrillation.  Bilateral lower extremities swollen but patient reported no more than normal.  Patient is compliant with home medications.  BP 167/110, HR 69.  Pro-BNP 2,878.  HS-Troponin 31. Chest x-ray Small bilateral pleural effusions and increased interstitial markings.  Cardiomegaly and atherosclerotic calcification of aorta.   ECHO/ LVEF: 55-60%.    Clinical Course:  Past Medical History:  Diagnosis Date   Actinic keratosis    Anginal pain    Aortic atherosclerosis    Arthritis    Atrial fibrillation (HCC)    Basal cell carcinoma 02/06/2010   Left shoulder. Superficial.   Basal cell carcinoma 10/11/2013   Right medial forearm. Superficial   Basal cell carcinoma 04/10/2015   Right inf. lat. thigh. Superficial   Basal cell carcinoma 12/09/2016   Right cheek. Superficial.   Basal cell carcinoma 08/18/2023   Right Medial Cheek, 10/13/2023 mohs with Dr. Corey   Basal cell carcinoma 08/18/2023   Right Supratip of Nose, 10/27/23 mohs w/ Dr. Corey   Chronic airway obstruction, not elsewhere classified    Colon polyps    Complication of anesthesia    SPINAL DID NOT WORK FOR LAST KNEE REPLACEMENT AND HAD TO HAVE GA   Coronary atherosclerosis of native coronary artery    nonobstructive   Diabetes mellitus without complication (HCC)    Essential hypertension, benign    GERD (gastroesophageal reflux disease)    H/O   Glucose intolerance (impaired glucose tolerance)    Heart murmur    History of kidney stones    Hyperlipidemia    Hypothyroidism    Iron deficiency anemia    Knee pain, right    LBBB  (left bundle branch block)    Occlusion and stenosis of carotid artery without mention of cerebral infarction    wears compression stockings   Other dyspnea and respiratory abnormality    w/ pseuodowheeze resolves with purse lip manuever   Precordial pain    Shoulder pain, right    Squamous cell carcinoma of skin 10/06/2007   Right forearm. SCCis   Squamous cell carcinoma of skin 10/11/2013   Left mid lat. pretibial. KA-like pattern.   Squamous cell carcinoma of skin 09/24/2023   SCC IS,  Right Hand - Posterior, EDC   Tremor    Unspecified sleep apnea    HAS NOT USED CPAP IN 20 YEARS   Venous insufficiency    Wears dentures    full upper   Wound cellulitis- right lower leg anterior  10/29/2019     Social History   Socioeconomic History   Marital status: Married    Spouse name: [name]   Number of children: 2   Years of education: Not on file   Highest education level: Bachelor's degree (e.g., BA, AB, BS)  Occupational History   Occupation: retired    Comment: [employer]  Tobacco Use   Smoking status: Former    Current packs/day: 0.00    Average packs/day: 2.0 packs/day for 31.0 years (62.0 ttl pk-yrs)    Types: Cigarettes    Start date: 12/24/1949    Quit date: 12/24/1980    Years since quitting: 43.4   Smokeless  tobacco: Never   Tobacco comments:    quit in 1982  Vaping Use   Vaping status: Never Used  Substance and Sexual Activity   Alcohol use: No    Alcohol/week: 0.0 standard drinks of alcohol   Drug use: No   Sexual activity: Not Currently  Other Topics Concern   Not on file  Social History Narrative   Lives with wife, retired Social Worker, 2 children, 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 (1 stepchild is deceased)   Social Drivers of Health   Tobacco Use: Medium Risk (05/18/2024)   Patient History    Smoking Tobacco Use: Former    Smokeless Tobacco Use: Never    Passive Exposure: Not on Actuary Strain: Low Risk (05/18/2024)    Overall Financial Resource Strain (CARDIA)    Difficulty of Paying Living Expenses: Not hard at all  Food Insecurity: No Food Insecurity (05/18/2024)   Epic    Worried About Radiation Protection Practitioner of Food in the Last Year: Never true    Ran Out of Food in the Last Year: Never true  Transportation Needs: No Transportation Needs (05/18/2024)   Epic    Lack of Transportation (Medical): No    Lack of Transportation (Non-Medical): No  Physical Activity: Inactive (08/06/2023)   Exercise Vital Sign    Days of Exercise per Week: 0 days    Minutes of Exercise per Session: 0 min  Stress: No Stress Concern Present (08/06/2023)   [employer] of Occupational Health - Occupational Stress Questionnaire    Feeling of Stress : Not at all  Social Connections: Socially Integrated (08/06/2023)   Social Connection and Isolation Panel    Frequency of Communication with Friends and Family: More than three times a week    Frequency of Social Gatherings with Friends and Family: More than three times a week    Attends Religious Services: More than 4 times per year    Active Member of Clubs or Organizations: Yes    Attends Banker Meetings: Never    Marital Status: Married  Depression (PHQ2-9): Low Risk (09/01/2023)   Depression (PHQ2-9)    PHQ-2 Score: 0  Alcohol Screen: Low Risk (08/06/2023)   Alcohol Screen    Last Alcohol Screening Score (AUDIT): 0  Housing: Unknown (05/18/2024)   Epic    Unable to Pay for Housing in the Last Year: No    Number of Times Moved in the Last Year: Not on file    Homeless in the Last Year: No  Utilities: Not At Risk (03/12/2024)   Received from [facility]   Epic    In the past 12 months has the electric, gas, oil, or water  company threatened to shut off services in your home?: No  Health Literacy: Adequate Health Literacy (08/06/2023)   B1300 Health Literacy    Frequency of need for help with medical instructions: Never  Education Assessment and  Provision:  Detailed education and instructions provided on heart failure disease management including the following:  Signs and symptoms of Heart Failure When to call the physician Importance of daily weights Low sodium diet Fluid restriction Medication management Anticipated future follow-up appointments  Patient education given on each of the above topics.  Patient acknowledges understanding via teach back method and acceptance of all instructions.  Education Materials:  Living Better With Heart Failure Booklet, HF zone tool, & Daily Weight Tracker Tool.  Patient has scale at home: Yes Patient has pill box at home: Yes  High Risk Criteria for Readmission and/or Poor Patient Outcomes: Heart failure hospital admissions (last 6 months): 1  No Show rate: 8% Difficult social situation: None determined Demonstrates medication adherence: Yes Primary Language: English Literacy level: Reading, Writing & Comprehension  Barriers of Care:   Daily Weights-Patient does not currently do daily weights Diet & Fluid Restrictions-Drinks less than 4 (16 oz) water  bottles a day. Eats out once in a while.  Considerations/Referrals:  Referral made to Heart Failure Pharmacist Stewardship: Yes Referral made to Heart Failure CSW/NCM TOC: No Referral made to Heart & Vascular TOC clinic: Yes. [facility]  06/11/24 @ 2:30 PM  Items for Follow-up on DC/TOC: Daily Weights Diet & Fluid Restrictions Continued Heart Failure Education  Charmaine Pines, RN, BSN [facility] Heart Failure Navigator Secure Chat Only

## 2024-05-18 NOTE — Evaluation (Signed)
 Occupational Therapy Evaluation Patient Details Name: Jared Rice MRN: [id number] DOB: 1932-11-27 Today's Date: 05/18/2024   History of Present Illness   Jared Rice is a pleasant 89 y.o. male with medical history significant for A-fib on Eliquis , bilateral lower extremity edema on diuretics, HTN, HLD, lymphoma and hypothyroidism came into ED complaining of shortness of breath that started last night. Chest xray showed bilateral pleural effusions.     Clinical Impressions Patient was seen for OT evaluation this date. Prior to hospital admission, patient was independent with ADLs, intermittently ambulating with a walking stick or a rolling walker. Patient lives in a two story home with spouse, she is able to assist with IADLs and minimal ADLs, but not with transfers/mobility. Patients bedroom/bathroom in on 2nd floor with 12 steps to access.  Patient on 2L of O2 at start of tx, not on O2 at home, SPO2 100% while at rest on 2L, OT removed O2 and patient performed bed mobility, incontinence care and transfer from EOB to recliner on RA and remained above 95%. OT educated on PLB techniques and energy conservation measures to implement into ADL routine to improve independence/efficiency, patient with good return demo. Patient is requiring mod-max A for LB self care tasks due to dyspnea and required min A/CGA for SPT from EOB to recliner. Patient presents with deficits in overall activity tolerance affecting safe and optimal ADL completion. Paient would benefit from skilled OT services to address noted impairments and functional limitations (see below for any additional details) in order to maximize safety and independence while minimizing future risk of falls, injury, and readmission. Anticipate the need for follow up OT services upon acute hospital DC.      If plan is discharge home, recommend the following:   A little help with walking and/or transfers;A little help with  bathing/dressing/bathroom;Assistance with cooking/housework;Help with stairs or ramp for entrance     Functional Status Assessment   Patient has had a recent decline in their functional status and demonstrates the ability to make significant improvements in function in a reasonable and predictable amount of time.     Equipment Recommendations   Other (comment) (defer to next venue)     Recommendations for Other Services         Precautions/Restrictions   Precautions Precautions: Fall Recall of Precautions/Restrictions: Intact Restrictions Weight Bearing Restrictions Per Provider Order: No     Mobility Bed Mobility Overal bed mobility: Needs Assistance Bed Mobility: Supine to Sit     Supine to sit: Mod assist     General bed mobility comments: required lifting A    Transfers Overall transfer level: Needs assistance Equipment used: Rolling walker (2 wheels) Transfers: Bed to chair/wheelchair/BSC       Step pivot transfers: Contact guard assist, Min assist     General transfer comment: cues for hand placement with transfer, steadying A while in stance      Balance Overall balance assessment: Needs assistance Sitting-balance support: No upper extremity supported, Feet supported Sitting balance-Leahy Scale: Good     Standing balance support: During functional activity, Reliant on assistive device for balance Standing balance-Leahy Scale: Fair                             ADL either performed or assessed with clinical judgement   ADL Overall ADL's : Needs assistance/impaired Eating/Feeding: Set up;Sitting   Grooming: Wash/dry hands;Sitting;Set up   Upper Body Bathing: Supervision/ safety;Sitting  Lower Body Bathing: Moderate assistance;Sit to/from stand   Upper Body Dressing : Supervision/safety;Sitting   Lower Body Dressing: Moderate assistance;Maximal assistance;Sit to/from stand   Toilet Transfer: Minimal assistance;Rolling  walker (2 wheels)   Toileting- Clothing Manipulation and Hygiene: Minimal assistance;Sit to/from stand       Functional mobility during ADLs: Minimal assistance;Rolling walker (2 wheels) General ADL Comments: needs A to reach past B knees for LB self care tasks, requires steadying A while in stance for LB tasks, can complete UB tasks while sitting with set up A     Vision         Perception         Praxis         Pertinent Vitals/Pain Pain Assessment Pain Assessment: No/denies pain     Extremity/Trunk Assessment Upper Extremity Assessment Upper Extremity Assessment: Overall WFL for tasks assessed   Lower Extremity Assessment Lower Extremity Assessment: Defer to PT evaluation       Communication Communication Communication: No apparent difficulties   Cognition Arousal: Alert Behavior During Therapy: WFL for tasks assessed/performed Cognition: No apparent impairments                               Following commands: Intact       Cueing  General Comments   Cueing Techniques: Verbal cues  patient on 2L at start of tx, SPO2 100%, removed O2 and on RA patient performed bed mobility and transfers and O2 remained above 95%, notified RN/MD/PT   Exercises     Shoulder Instructions      Home Living Family/patient expects to be discharged to:: Private residence Living Arrangements: Spouse/significant other Available Help at Discharge: Family;Available PRN/intermittently Type of Home: House Home Access: Stairs to enter [employer] of Steps: 3 Entrance Stairs-Rails: Right Home Layout: Two level Alternate Level Stairs-Number of Steps: 12 Alternate Level Stairs-Rails: Can reach both Bathroom Shower/Tub: Producer, Television/film/video: Handicapped height Bathroom Accessibility: Yes How Accessible: Accessible via walker Home Equipment: Cane - single point;Rolling Walker (2 wheels);Shower seat - built in;Grab bars - tub/shower           Prior Functioning/Environment Prior Level of Function : Independent/Modified Independent             Mobility Comments: intermittently used r/w at night, used walking stick out of the home ADLs Comments: independent, wife mostly manages IADLs    OT Problem List: Decreased strength;Decreased activity tolerance;Impaired balance (sitting and/or standing)   OT Treatment/Interventions: Self-care/ADL training;Energy conservation;Therapeutic activities;Therapeutic exercise;Patient/family education;Balance training      OT Goals(Current goals can be found in the care plan section)   Acute Rehab OT Goals Patient Stated Goal: to go home OT Goal Formulation: With patient Time For Goal Achievement: 06/01/24 Potential to Achieve Goals: Good ADL Goals Pt Will Perform Grooming: with modified independence;standing Pt Will Perform Upper Body Dressing: with modified independence;sitting Pt Will Transfer to Toilet: with modified independence;ambulating Pt Will Perform Toileting - Clothing Manipulation and hygiene: with modified independence;sit to/from stand   OT Frequency:  Min 2X/week    Co-evaluation              AM-PAC OT 6 Clicks Daily Activity     Outcome Measure Help from another person eating meals?: None Help from another person taking care of personal grooming?: None Help from another person toileting, which includes using toliet, bedpan, or urinal?: A Little Help from another person bathing (  including washing, rinsing, drying)?: A Little Help from another person to put on and taking off regular upper body clothing?: None Help from another person to put on and taking off regular lower body clothing?: A Little 6 Click Score: 21   End of Session Equipment Utilized During Treatment: Rolling walker (2 wheels);Oxygen Nurse Communication: Mobility status  Activity Tolerance: Patient tolerated treatment well Patient left: in chair;with call bell/phone within reach;with  chair alarm set  OT Visit Diagnosis: Unsteadiness on feet (R26.81);Other abnormalities of gait and mobility (R26.89)                Time: 9192-9171 OT Time Calculation (min): 21 min Charges:  OT General Charges $OT Visit: 1 Visit OT Evaluation $OT Eval Low Complexity: 1 Low  Rogers Clause, OT/L MSOT, 05/18/2024

## 2024-05-18 NOTE — Telephone Encounter (Signed)
" °*  STAT* If patient is at the pharmacy, call can be transferred to refill team.   1. Which medications need to be refilled? (please list name of each medication and dose if known)   furosemide  (LASIX ) 40 MG tablet   2. Would you like to learn more about the convenience, safety, & potential cost savings by using the [facility]?   3. Are you open to using the [facility] (Type [facility]. ).  4. Which pharmacy/location (including street and city if local pharmacy) is medication to be sent to?  [id number] [name], [place] [phone]   5. Do they need a 30 day or 90 day supply?   Patient stated he is completely out of this medication.  "

## 2024-05-18 NOTE — Evaluation (Signed)
 Physical Therapy Evaluation Patient Details Name: Jared Rice MRN: [id number] DOB: Jun 17, 1932 Today's Date: 05/18/2024  History of Present Illness  presented to ER secondary to SOB; admitted for management of acute/chronic CHF exacerbation.  Clinical Impression  Patient seated in chair upon arrival to room; alert and oriented, follows commands and agreeable to participation with session.   Multiple supportive family members-wife, daughter, granddaughter-present at bedside throughout session. At baseline, patient lives with wife in 2-level home; 2-3 steps to enter (R rail) and full flight to access bed/bathroom in home (bilat rails).  Furniture cruises throughout main level of home; uses RW throughout upper level of home.  Denies fall history. Bilat UE/LE strength and ROM grossly symmetrical and WFL for basic transfers and mobility; mild/mod discoloration and edema noted mid-calf distally bilat LEs (improved since admission).  Able to complete sit/stand, basic transfers and gait (125') with RW, cga/min assist.  Demonstrates forward flexed posture with mod WBing on RW; reciprocal stepping pattern with fair step height/length; decreased cadence, but no buckling or LOB. Mild SOB with gait efforts; desat to 87-88% on RA with gait efforts, recovering >90% within 60 seconds of seated rest.  Higher level balance and functional endurance deficits appreciated.  Do recommend continued use of RW for all mobility at this time; patient/family voice understanding and agreement. Would benefit from skilled PT to address above deficits and promote optimal return to PLOF.; recommend post-acute PT follow up as indicated by interdisciplinary care team.           If plan is discharge home, recommend the following: A little help with walking and/or transfers;A little help with bathing/dressing/bathroom   Can travel by private vehicle        Equipment Recommendations    Recommendations for Other Services        Functional Status Assessment Patient has had a recent decline in their functional status and demonstrates the ability to make significant improvements in function in a reasonable and predictable amount of time.     Precautions / Restrictions Precautions Precautions: Fall Recall of Precautions/Restrictions: Intact Restrictions Weight Bearing Restrictions Per Provider Order: No      Mobility  Bed Mobility               General bed mobility comments: seated in recliner beginning/end of treatment session    Transfers Overall transfer level: Needs assistance Equipment used: Rolling walker (2 wheels) Transfers: Bed to chair/wheelchair/BSC     Step pivot transfers: Contact guard assist       General transfer comment: heavy use of UEs for lift off, anterior weight translation and stabilization    Ambulation/Gait Ambulation/Gait assistance: Contact guard assist Gait Distance (Feet): 125 Feet Assistive device: Rolling walker (2 wheels)         General Gait Details: forward flexed posture with mod WBing on RW; reciprocal stepping pattern with fair step height/length; decreased cadence, but no buckling or LOB.  Mild SOB with gait efforts; desat to 87-88% on RA with gait efforts, recovering >90% within 60 seconds of seated rest  Stairs            Wheelchair Mobility     Tilt Bed    Modified Rankin (Stroke Patients Only)       Balance Overall balance assessment: Needs assistance Sitting-balance support: No upper extremity supported, Feet supported Sitting balance-Leahy Scale: Good     Standing balance support: Bilateral upper extremity supported Standing balance-Leahy Scale: Fair  Pertinent Vitals/Pain Pain Assessment Pain Assessment: No/denies pain    Home Living Family/patient expects to be discharged to:: Private residence Living Arrangements: Spouse/significant other Available Help at Discharge:  Family;Available PRN/intermittently Type of Home: House Home Access: Stairs to enter Entrance Stairs-Rails: Right   Alternate Level Stairs-Number of Steps: 12 Home Layout: Two level Home Equipment: Cane - single Librarian, Academic (2 wheels);Shower seat - built in;Grab bars - tub/shower      Prior Function Prior Level of Function : Independent/Modified Independent             Mobility Comments: Ambulatory without assist device on main level of home, furniture cruises; does utilize [employer] and SPC with out of the house.  Denies fall history. ADLs Comments: Wife assists with ADLs as needed     Extremity/Trunk Assessment   Upper Extremity Assessment Upper Extremity Assessment: Overall WFL for tasks assessed    Lower Extremity Assessment Lower Extremity Assessment: Overall WFL for tasks assessed (grossly at least 4/5 throughout; no focal weakness appreciated)       Communication   Communication Communication: No apparent difficulties    Cognition Arousal: Alert Behavior During Therapy: WFL for tasks assessed/performed   PT - Cognitive impairments: No apparent impairments                         Following commands: Intact       Cueing Cueing Techniques: Verbal cues     General Comments      Exercises     Assessment/Plan    PT Assessment Patient needs continued PT services  PT Problem List Decreased strength;Decreased activity tolerance;Decreased balance;Decreased mobility;Cardiopulmonary status limiting activity;Decreased knowledge of use of DME;Decreased safety awareness;Decreased knowledge of precautions       PT Treatment Interventions DME instruction;Gait training;Stair training;Functional mobility training;Therapeutic activities;Therapeutic exercise;Balance training;Patient/family education    PT Goals (Current goals can be found in the Care Plan section)  Acute Rehab PT Goals Patient Stated Goal: to go home today! PT Goal  Formulation: With patient/family Time For Goal Achievement: 06/01/24 Potential to Achieve Goals: Good    Frequency Min 2X/week     Co-evaluation               AM-PAC PT 6 Clicks Mobility  Outcome Measure Help needed turning from your back to your side while in a flat bed without using bedrails?: None Help needed moving from lying on your back to sitting on the side of a flat bed without using bedrails?: A Little Help needed moving to and from a bed to a chair (including a wheelchair)?: A Little Help needed standing up from a chair using your arms (e.g., wheelchair or bedside chair)?: A Little Help needed to walk in hospital room?: A Little Help needed climbing 3-5 steps with a railing? : A Little 6 Click Score: 19    End of Session Equipment Utilized During Treatment: Gait belt Activity Tolerance: Patient tolerated treatment well Patient left: in chair;with call bell/phone within reach;with family/visitor present Nurse Communication: Mobility status PT Visit Diagnosis: Difficulty in walking, not elsewhere classified (R26.2);Muscle weakness (generalized) (M62.81)    Time: 8462-8394 PT Time Calculation (min) (ACUTE ONLY): 28 min   Charges:   PT Evaluation $PT Eval Moderate Complexity: 1 Mod   PT General Charges $$ ACUTE PT VISIT: 1 Visit         Kostas Marrow H. Delores, PT, DPT, NCS 05/18/2024, 5:51 PM [phone]

## 2024-05-19 ENCOUNTER — Other Ambulatory Visit: Payer: Self-pay

## 2024-05-19 DIAGNOSIS — I5033 Acute on chronic diastolic (congestive) heart failure: Secondary | ICD-10-CM | POA: Diagnosis not present

## 2024-05-19 LAB — BASIC METABOLIC PANEL WITH GFR
Anion gap: 10 (ref 5–15)
BUN: 28 mg/dL — ABNORMAL HIGH (ref 8–23)
CO2: 34 mmol/L — ABNORMAL HIGH (ref 22–32)
Calcium: 8.7 mg/dL — ABNORMAL LOW (ref 8.9–10.3)
Chloride: 97 mmol/L — ABNORMAL LOW (ref 98–111)
Creatinine, Ser: 1.45 mg/dL — ABNORMAL HIGH (ref 0.61–1.24)
GFR, Estimated: 46 mL/min — ABNORMAL LOW
Glucose, Bld: 84 mg/dL (ref 70–99)
Potassium: 4 mmol/L (ref 3.5–5.1)
Sodium: 141 mmol/L (ref 135–145)

## 2024-05-19 LAB — CBC
HCT: 31.4 % — ABNORMAL LOW (ref 39.0–52.0)
Hemoglobin: 9.8 g/dL — ABNORMAL LOW (ref 13.0–17.0)
MCH: 29.8 pg (ref 26.0–34.0)
MCHC: 31.2 g/dL (ref 30.0–36.0)
MCV: 95.4 fL (ref 80.0–100.0)
Platelets: 141 K/uL — ABNORMAL LOW (ref 150–400)
RBC: 3.29 MIL/uL — ABNORMAL LOW (ref 4.22–5.81)
RDW: 14.9 % (ref 11.5–15.5)
WBC: 6.6 K/uL (ref 4.0–10.5)
nRBC: 0 % (ref 0.0–0.2)

## 2024-05-19 LAB — MAGNESIUM: Magnesium: 2.2 mg/dL (ref 1.7–2.4)

## 2024-05-19 MED ORDER — SPIRONOLACTONE 25 MG PO TABS: 25.0000 mg | ORAL_TABLET | Freq: Every day | ORAL | 1 refills | Status: DC

## 2024-05-19 MED ORDER — FUROSEMIDE 40 MG PO TABS: ORAL_TABLET | ORAL | 1 refills | Status: DC

## 2024-05-19 MED ORDER — EMPAGLIFLOZIN 10 MG PO TABS: 10.0000 mg | ORAL_TABLET | Freq: Every day | ORAL | 1 refills | Status: DC

## 2024-05-19 MED ORDER — FUROSEMIDE 40 MG PO TABS: 40.0000 mg | ORAL_TABLET | Freq: Every day | ORAL | 1 refills | Status: DC

## 2024-05-19 MED ORDER — FUROSEMIDE 40 MG PO TABS: 40.0000 mg | ORAL_TABLET | Freq: Every day | ORAL | Status: DC

## 2024-05-19 MED ORDER — FUROSEMIDE 40 MG PO TABS: ORAL_TABLET | ORAL | 0 refills | Status: DC

## 2024-05-19 NOTE — Progress Notes (Signed)
 "  Progress Note  Patient Name: Jared Rice Date of Encounter: 05/19/2024  Primary Cardiologist: Jordan  Subjective   Dyspnea resolved, back to baseline. No chest pain. Documented UOP incomplete. Hopeful to go home today.   Inpatient Medications    Scheduled Meds:  apixaban   5 mg Oral BID   empagliflozin   10 mg Oral Daily   [START ON 05/20/2024] furosemide   40 mg Oral Daily   levothyroxine   88 mcg Oral q morning   psyllium  1 packet Oral Daily   ramipril   5 mg Oral Daily   rosuvastatin   10 mg Oral QHS   spironolactone   25 mg Oral Daily   Continuous Infusions:  PRN Meds: acetaminophen  **OR** acetaminophen , diphenhydrAMINE , ipratropium-albuterol , ondansetron  **OR** ondansetron  (ZOFRAN ) IV, oxyCODONE , polyethylene glycol   Vital Signs    Vitals:   05/18/24 1546 05/18/24 2206 05/19/24 0226 05/19/24 0648  BP: 105/66 139/67 136/74 122/71  Pulse: 76 84 84 75  Resp: 18 20 20 14   Temp: 98.3 F (36.8 C) 98 F (36.7 C) 98.1 F (36.7 C) 98 F (36.7 C)  TempSrc: Oral Oral Oral Oral  SpO2: 94% 98% 95% 98%  Weight:      Height:       No intake or output data in the 24 hours ending 05/19/24 0757  Filed Weights   05/17/24 0712  Weight: 104.3 kg    Telemetry    Afib with occasional PVCs vs aberrancy with artifact, 60s to 70s bpm - Personally Reviewed  ECG    No new tracings - Personally Reviewed  Physical Exam   GEN: No acute distress.   Neck: No JVD. Cardiac: IRIR, II/VI systolic murmur LUSB, no rubs, or gallops.  Respiratory: Improved, clear to auscultation bilaterally.  GI: Soft, nontender, non-distended.   MS: Improved and mild bilateral lower extremity pitting edema with chronic woody appearance and hyperpigmentation. Neuro:  Alert and oriented x 3; Nonfocal.  Psych: Normal affect.  Labs    Chemistry Recent Labs  Lab 05/17/24 0714 05/17/24 1030 05/18/24 0502 05/19/24 0219  NA 142  --  142 141  K 4.6  --  3.9 4.0  CL 105  --  101 97*  CO2 30   --  32 34*  GLUCOSE 106*  --  84 84  BUN 14  --  18 28*  CREATININE 0.93 0.99 1.12 1.45*  CALCIUM  8.6*  --  8.3* 8.7*  PROT  --   --  5.5*  --   ALBUMIN  --   --  3.4*  --   AST  --   --  <10*  --   ALT  --   --  <5  --   ALKPHOS  --   --  63  --   BILITOT  --   --  0.6  --   GFRNONAA >60 >60 >60 46*  ANIONGAP 7  --  8 10     Hematology Recent Labs  Lab 05/17/24 0714 05/18/24 0502 05/19/24 0219  WBC 6.1 4.5 6.6  RBC 3.37* 3.03* 3.29*  HGB 10.3* 9.0* 9.8*  HCT 33.4* 29.6* 31.4*  MCV 99.1 97.7 95.4  MCH 30.6 29.7 29.8  MCHC 30.8 30.4 31.2  RDW 15.0 15.1 14.9  PLT 145* 121* 141*    Cardiac EnzymesNo results for input(s): TROPONINI in the last 168 hours. No results for input(s): TROPIPOC in the last 168 hours.   BNP Recent Labs  Lab 05/17/24 0714  PROBNP 7,121.9*  DDimer No results for input(s): DDIMER in the last 168 hours.   Radiology    DG Chest 2 View Result Date: 05/17/2024 IMPRESSION: 1. Small bilateral pleural effusions and increased interstitial markings. 2. Cardiomegaly and atherosclerotic calcifications of aorta. Electronically signed by: Evalene Coho MD 05/17/2024 07:40 AM EST RP Workstation: [id number]    Cardiac Studies   2D echo 05/17/2024: 1. Left ventricular ejection fraction, by estimation, is 55 to 60%. The  left ventricle has normal function. The left ventricle has no regional  wall motion abnormalities. There is severe left ventricular hypertrophy.  Left ventricular diastolic parameters   are indeterminate.   2. Right ventricular systolic function is normal. The right ventricular  size is normal.   3. Left atrial size was severely dilated.   4. Right atrial size was moderately dilated.   5. The mitral valve is normal in structure. Mild mitral valve  regurgitation. No evidence of mitral stenosis. Moderate mitral annular  calcification.   6. The aortic valve is normal in structure. Aortic valve regurgitation is  not  visualized. Aortic valve sclerosis/calcification is present, without  any evidence of aortic stenosis.   7. The inferior vena cava is normal in size with <50% respiratory  variability, suggesting right atrial pressure of 8 mmHg.  __________  2D echo 02/25/2019 [facility]): Summary   1. The left ventricle is normal in size with moderately increased wall  thickness.   2. Normal left ventricular systolic function, ejection fraction > 55%.    3. Normal right ventricular size and systolic function.    4. Tricuspid regurgitation - mild.  __________   2D echo 01/03/2012: - Left ventricle: The cavity size was mildly dilated. Wall    thickness was increased in a pattern of mild LVH. There    was mild focal basal hypertrophy of the septum. Systolic    function was normal. The estimated ejection fraction was    in the range of 55% to 60%. Wall motion was normal; there    were no regional wall motion abnormalities. Doppler    parameters are consistent with abnormal left ventricular    relaxation (grade 1 diastolic dysfunction).  - Ventricular septum: Septal motion showed abnormal function    and dyssynergy. These changes are consistent with a left    bundle branch block.  - Left atrium: The atrium was moderately dilated.  - Right atrium: The atrium was mildly dilated.  - Pulmonary arteries: Systolic pressure was mildly    increased. PA peak pressure: 34mm Hg (S).   Patient Profile     89 y.o. male with history of nonobstructive CAD by remote LHC, persistent, likely permanent A-fib, LBBB, COPD, carotid artery disease, chronic lower extremity swelling, thoracic aortic aneurysm measuring 4 cm by CTA in 2022, HTN, HLD, OSA noncompliant with CPAP, normocytic anemia, thrombocytopenia, hypothyroidism he who is being seen today for the evaluation of elevated troponin and volume overload at the request of Dr. Roann, MD.   Assessment & Plan    1. Acute HFpEFa: - Likely in the setting of uncontrolled BP and  untreated sleep apnea - Much improved, now with some AKI - Echo this admission with preserved LVSF with severe LVH - Transition from IV Lasix  40 mg twice daily to oral Lasix  40 mg daily (starting 12/25) along with the addition of spironolactone  and Jardiance  this admission  - Daily weights and strict I's and O's - Escalate GDMT as able  - Compression socks and leg elevation recommended  2.  Persistent, likely permanent A-fib: - Ventricular rates well-controlled, not requiring AV nodal blocking medication - PTA apixaban  5 mg twice daily, does not meet reduced dosing criteria - CHA2DS2-VASc at least 4 (HTN, age x 2, vascular disease)   3.  Accelerated hypertension: - Improved - Contributing to presentation - Stopped Cardura , would not resume - Continue PTA ramipril  and newly started spironolactone     4.  History of nonobstructive CAD with elevated high-sensitivity troponin: - Mildly elevated and downtrending, not consistent with ACS - Likely supply/demand ischemia in the setting of volume overload and underlying anemia - No indication for heparin  drip at this time - Echo with preserved LVSF - No plans for inpatient ischemic testing at this time - Has been maintained on apixaban  in lieu of aspirin - PTA rosuvastatin    5.  Normocytic anemia with thrombocytopenia: - Hgb low, though overall consistent with prior readings in 2024 - No symptoms of bleeding - Monitor  6. AKI: - Stop IV Lasix  as above - Follow up BMP in the office in the next 5-7 days   Should be ok for discharge from a cardiac perspective once MD has staffed. Follow up in our [place] office in 1-2 weeks.      For questions or updates, please contact [facility] Please consult [email or web address] for contact info under Cardiology/STEMI.    Signed, Bernardino Bring, PA-C [facility] Pager: [phone] 05/19/2024, 7:57 AM  "

## 2024-05-19 NOTE — Discharge Instructions (Addendum)
 Follow up with PCP in 1 week. Return to ER if necessary.   [facility] services have been arranged to provide physical and occupational therapy. You will receive a call within 24-48 hours after discharge. If you do not hear from them please give them a call at [phone].

## 2024-05-19 NOTE — TOC Initial Note (Signed)
 Transition of Care [name]) - Initial/Assessment Note    Patient Details  Name: Jared Rice MRN: [id number] Date of Birth: [birth year]  [name]) CM/SW Contact:    [name], LCSW Phone Number: 05/19/2024, 10:55 AM  Clinical Narrative:                  CSW spoke with patient daughter, [name]. Choice reviewed for home care. [facility] selected in the [place]. [name] declined BSC.   Discharge instructions update.        Patient Goals and CMS Choice            Expected Discharge Plan and Services         Expected Discharge Date: 05/19/24                                    Prior Living Arrangements/Services                       Activities of Daily Living      Permission Sought/Granted                  Emotional Assessment              Admission diagnosis:  CHF (congestive heart failure) (HCC) [I50.9] Patient Active Problem List   Diagnosis Date Noted   CHF (congestive heart failure) (HCC) 05/17/2024   Memory loss, short term 10/02/2022   Incontinence of feces with fecal urgency 10/02/2022   Loose bowel movements 10/02/2022   Chronic diarrhea 10/02/2022   Cellulitis of right upper extremity 09/25/2022   Localized swelling of left lower extremity 08/14/2022   Frequent UTI 06/12/2022   Memory changes 06/12/2022   Diarrhea 10/26/2021   Status post total right knee replacement 10/19/2020   Atrial fibrillation (HCC) 05/12/2020   Urinary frequency 11/22/2019   Skin tear of forearm without complication, left, subsequent encounter 11/22/2019   Leg wound, right, sequela 10/29/2019   Chronic fatigue 11/29/2015   Benign prostatic hyperplasia 02/22/2015   Gastrointestinal ulcer    History of peptic ulcer    Hematochezia    Acute gastric ulcer    Melena 11/12/2014   Malignant neoplasm of skin 09/21/2014   Cataract 09/21/2014   Colon polyp 09/21/2014   CAFL (chronic airflow limitation) (HCC) 09/21/2014   DD  (diverticular disease) 09/21/2014   Benign essential tremor 09/21/2014   Personal history of tobacco use, presenting hazards to health 09/21/2014   Acid reflux 09/21/2014   Hemorrhoids 09/21/2014   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   HLD (hyperlipidemia) 09/21/2014   BP (high blood pressure) 09/21/2014   Hypothyroidism 09/21/2014   Lymphoma (HCC) 09/21/2014   Malaise and fatigue 09/21/2014   Mononeuritis 09/21/2014   Muscle ache 09/21/2014   Neuropathy 09/21/2014   Numbness and tingling 09/21/2014   Arthritis, degenerative 09/21/2014   Awareness of heartbeats 09/21/2014   Prediabetes 09/21/2014   Rotator cuff syndrome 09/21/2014   Has a tremor 09/21/2014   Stasis, venous 09/21/2014   Avitaminosis D 09/21/2014   Bradycardia, sinus 12/31/2011   Left bundle branch block 12/31/2011   First degree AV block 12/31/2011   Edema of both legs 12/31/2011   Bradycardia 12/31/2011   Localized edema 12/31/2011   Other specified chronic obstructive pulmonary disease (HCC) 11/09/2009   HYPERTENSION, BENIGN 10/03/2009   Sleep apnea 10/03/2009   DYSPNEA 10/03/2009   CAD, NATIVE VESSEL  05/04/2009   Carotid disease, bilateral 05/04/2009   Disorder of artery or arteriole 05/04/2009   PCP:  Sharma Coyer, MD Pharmacy:   [name] [name], [name] - [address] [name] [address] Phone: [phone] Fax: [phone]  [facility] - [place] , [place] - [address] [place]  [place] [address] Phone: [phone] Fax: [phone]  [id number] [name], [facility] - [address] [place] [address] Phone: [phone] Fax: [phone]  [name] - [name], [place] - [name] [name] [address] Phone: [phone] Fax: [phone]     Social Drivers of Health (SDOH) Social History: SDOH Screenings   Food Insecurity: No  Food Insecurity (05/18/2024)  Housing: Unknown (05/18/2024)  Transportation Needs: No Transportation Needs (05/18/2024)  Utilities: Not At Risk (03/12/2024)   Received from [facility]  Alcohol Screen: Low Risk (08/06/2023)  Depression (PHQ2-9): Low Risk (09/01/2023)  Financial Resource Strain: Low Risk (05/18/2024)  Physical Activity: Inactive (08/06/2023)  Social Connections: Socially Integrated (08/06/2023)  Stress: No Stress Concern Present (08/06/2023)  Tobacco Use: Medium Risk (05/18/2024)  Health Literacy: Adequate Health Literacy (08/06/2023)   SDOH Interventions: Food Insecurity Interventions: Intervention Not Indicated Housing Interventions: Intervention Not Indicated Transportation Interventions: Intervention Not Indicated Alcohol Usage Interventions: Intervention Not Indicated (Score <7) Financial Strain Interventions: Intervention Not Indicated   Readmission Risk Interventions     No data to display

## 2024-05-19 NOTE — TOC Transition Note (Signed)
 Transition of Care [facility]) - Discharge Note   Patient Details  Name: Jared Rice MRN: [id number] Date of Birth: [birth year]  [facility]) CM/SW Contact:  Veyda Kaufman L Surafel Hilleary, LCSW Phone Number: 05/19/2024, 11:20 AM   Clinical Narrative:     Patient is medially stable for discharge today. CSW spoke with patient daughter and review choice for HHA. Family decline DME recommendations.   No further TOC needs. TOC signing off.         Patient Goals and CMS Choice            Discharge Placement                       Discharge Plan and Services Additional resources added to the After Visit Summary for                                       Social Drivers of Health (SDOH) Interventions SDOH Screenings   Food Insecurity: No Food Insecurity (05/18/2024)  Housing: Unknown (05/18/2024)  Transportation Needs: No Transportation Needs (05/18/2024)  Utilities: Not At Risk (03/12/2024)   Received from [facility]  Alcohol Screen: Low Risk (08/06/2023)  Depression (PHQ2-9): Low Risk (09/01/2023)  Financial Resource Strain: Low Risk (05/18/2024)  Physical Activity: Inactive (08/06/2023)  Social Connections: Socially Integrated (08/06/2023)  Stress: No Stress Concern Present (08/06/2023)  Tobacco Use: Medium Risk (05/18/2024)  Health Literacy: Adequate Health Literacy (08/06/2023)     Readmission Risk Interventions     No data to display

## 2024-05-19 NOTE — Progress Notes (Signed)
 Patient is not able to walk the distance required to go the bathroom, or he/she is unable to safely negotiate stairs required to access the bathroom.  A 3in1 BSC will alleviate this problem

## 2024-05-19 NOTE — Addendum Note (Signed)
 Addended by: BETHENA POWELL SAUNDERS on: 05/19/2024 08:48 AM   Modules accepted: Orders

## 2024-05-19 NOTE — Progress Notes (Signed)
 Occupational Therapy Treatment Patient Details Name: Jared Rice MRN: [id number] DOB: Oct 08, 1932 Today's Date: 05/19/2024   History of present illness presented to ER secondary to SOB; admitted for management of acute/chronic CHF exacerbation.   OT comments  Patient seen for OT treatment on this date. Upon arrival to room patient sitting up in chair, agreeable to treatment. Focus of visit was discussing home safety/set up and needed AE/DME, patient with spouse and 2 daughters in the room. Biggest concern is ascending/descending flight of stairs to bedroom/bathroom, OT discussed stair lift and patient agreeable, daughters to arrange. OT recommendign BSC, patient agreeable, OT to add order..  Patient ended treatment in chair with bed/chair alarm on and all needs within reach. Patient making good progress toward goals, will continue to follow POC. Discharge recommendation remains appropriate.        If plan is discharge home, recommend the following:  A little help with walking and/or transfers;A little help with bathing/dressing/bathroom;Assistance with cooking/housework;Help with stairs or ramp for entrance   Equipment Recommendations  BSC/3in1    Recommendations for Other Services      Precautions / Restrictions Precautions Precautions: Fall Recall of Precautions/Restrictions: Intact Restrictions Weight Bearing Restrictions Per Provider Order: No       Mobility Bed Mobility               General bed mobility comments: seated in recliner beginning/end of treatment session    Transfers Overall transfer level: Needs assistance Equipment used: Rolling walker (2 wheels) Transfers: Bed to chair/wheelchair/BSC       Step pivot transfers: Contact guard assist, Supervision     General transfer comment: requires use of r/w     Balance Overall balance assessment: Needs assistance Sitting-balance support: No upper extremity supported, Feet supported Sitting  balance-Leahy Scale: Good     Standing balance support: Bilateral upper extremity supported Standing balance-Leahy Scale: Fair                             ADL either performed or assessed with clinical judgement   ADL                                         General ADL Comments: was able to don s hoes with increased time and effort, toileting with SBA/CGA    Extremity/Trunk Assessment Upper Extremity Assessment Upper Extremity Assessment: Overall WFL for tasks assessed   Lower Extremity Assessment Lower Extremity Assessment: Overall WFL for tasks assessed        Vision       Perception     Praxis     Communication Communication Communication: No apparent difficulties   Cognition Arousal: Alert Behavior During Therapy: WFL for tasks assessed/performed                                 Following commands: Intact        Cueing   Cueing Techniques: Verbal cues  Exercises      Shoulder Instructions       General Comments educated on fall prevention and energy conservation strategies to implement at home to reduce risk of rehospitlization, patient/family receptive    Pertinent Vitals/ Pain       Pain Assessment Pain Assessment: No/denies pain  Home Living  Prior Functioning/Environment              Frequency  Min 2X/week        Progress Toward Goals  OT Goals(current goals can now be found in the care plan section)  Progress towards OT goals: Progressing toward goals  Acute Rehab OT Goals Patient Stated Goal: to go home OT Goal Formulation: With patient Time For Goal Achievement: 06/01/24 Potential to Achieve Goals: Good ADL Goals Pt Will Perform Grooming: with modified independence;standing Pt Will Perform Upper Body Dressing: with modified independence;sitting Pt Will Transfer to Toilet: with modified independence;ambulating Pt Will  Perform Toileting - Clothing Manipulation and hygiene: with modified independence;sit to/from stand  Plan      Co-evaluation                 AM-PAC OT 6 Clicks Daily Activity     Outcome Measure   Help from another person eating meals?: None Help from another person taking care of personal grooming?: None Help from another person toileting, which includes using toliet, bedpan, or urinal?: A Little Help from another person bathing (including washing, rinsing, drying)?: A Little Help from another person to put on and taking off regular upper body clothing?: None Help from another person to put on and taking off regular lower body clothing?: A Little 6 Click Score: 21    End of Session Equipment Utilized During Treatment: Rolling walker (2 wheels)  OT Visit Diagnosis: Unsteadiness on feet (R26.81);Other abnormalities of gait and mobility (R26.89)   Activity Tolerance Patient tolerated treatment well   Patient Left in chair;with call bell/phone within reach;with chair alarm set;with family/visitor present   Nurse Communication Mobility status        Time: 0911-0922 OT Time Calculation (min): 11 min  Charges: OT General Charges $OT Visit: 1 Visit OT Treatments $Self Care/Home Management : 8-22 mins  Rogers Clause, OT/L MSOT, 05/19/2024

## 2024-05-19 NOTE — Discharge Summary (Signed)
 " Physician Discharge Summary   Patient: Jared Rice MRN: [id number] DOB: Mar 27, 1933  Admit date:     05/17/2024  Discharge date: 05/19/2024  Discharge Physician: Laree Lock   PCP: Sharma Coyer, MD   Recommendations at discharge:   Follow up with PCP within 5 days - repeat BMP (Monitor Cr for AKI) - On lasix , spironolactone , Jardiance , Ramipril   Follow up with Cardiology outpatient within 2 weeks  Home PT/OT arranged  Discharge Diagnoses: Principal Problem:   CHF (congestive heart failure) (HCC) Active Problems:   HYPERTENSION, BENIGN   CAD, NATIVE VESSEL   Sleep apnea   Left bundle branch block   HLD (hyperlipidemia)   Hypothyroidism   Lymphoma (HCC)   Atrial fibrillation [facility])   Hospital Course: Jared Rice is a pleasant 89 y.o. male with medical history significant for A-fib on Eliquis , bilateral lower extremity edema on diuretics, HTN, HLD, lymphoma and hypothyroidism presented to the ED due to SOB, not on home oxygen.  Workup revealed pulmonary vascular congestion, patient admitted for acute on chronic HFpEF, hospital course as below  Patient euvolemic and improved with IV diuresis, has AKI.  Family insisting they would like to take him home today, will hold diuresis today, recommend continuing p.o. Lasix  from tomorrow and follow-up with PCP/cardiology outpatient  Acute on chronic HFpEF Likely in the setting of uncontrolled hypertension and untreated sleep apnea and Ran out of lasix  for 1 month Echo shows EF 50 to 55%, no RWMA, severe LVH.  LA severely dilated.  Mild MR S/p IV diuresis, euvolemic but has AKI Resume po lasix  12/25, spironolactone , Jardiance  started this admission Seen by cardiology, follow-up outpatient  AKI Cr increased to 1.45 Hold diuretics today Resume po lasix  tomorrow 12/25 Follow up with PCP within 5 days for repeat labs   A-fib with controlled response Eliquis , rate controlled   Hx non obstructive CAD Elevated  troponin likely due to demand ischemia On statin   Normocytic anemia Monitor Hb   Hypothyroidism  levothyroxine  88 mcg daily   HTN/HLD - statin, ramipril  - discontinue Cardura    Deconditioning PT/OT rec Home PT/OT  Consultants: Cardiology Procedures performed: None  Disposition: Home health Diet recommendation:  Discharge Diet Orders (From admission, onward)     Start     Ordered   05/19/24 0000  Diet - low sodium heart healthy        05/19/24 1039            DISCHARGE MEDICATION: Allergies as of 05/19/2024       Reactions   Dexilant [dexlansoprazole] Rash   Primidone Itching, Rash        Medication List     STOP taking these medications    doxazosin  8 MG tablet Commonly known as: CARDURA        TAKE these medications    apixaban  5 MG Tabs tablet Commonly known as: Eliquis  Take 1 tablet (5 mg total) by mouth 2 (two) times daily.   clobetasol  cream 0.05 % Commonly known as: TEMOVATE  Apply twice a day until smooth. Avoid applying to face, groin, and axilla.   cyanocobalamin  1000 MCG tablet Commonly known as: VITAMIN B12 Take 1 tablet (1,000 mcg total) by mouth daily.   Dupixent  300 MG/2ML prefilled syringe Generic drug: dupilumab  Inject 300 mg into the skin every 14 (fourteen) days. Starting at day 15 for maintenance.   empagliflozin  10 MG Tabs tablet Commonly known as: JARDIANCE  Take 1 tablet (10 mg total) by mouth daily. Start taking on: May 20, 2024   furosemide  40 MG tablet Commonly known as: LASIX  Take 1 tablet (40 mg total) by mouth daily. Start taking on: May 20, 2024 What changed: See the new instructions.   levothyroxine  88 MCG tablet Commonly known as: SYNTHROID  Take 88 mcg by mouth every morning.   psyllium 58.6 % packet Commonly known as: METAMUCIL Take 1 packet by mouth daily.   ramipril  5 MG capsule Commonly known as: ALTACE  Take 1 capsule (5 mg total) by mouth daily.   rosuvastatin  10 MG  tablet Commonly known as: CRESTOR  Take 1 tablet by mouth once daily   spironolactone  25 MG tablet Commonly known as: ALDACTONE  Take 1 tablet (25 mg total) by mouth daily. Start taking on: May 20, 2024   triamcinolone  ointment 0.1 % Commonly known as: KENALOG  Apply 1 gram twice daily to affected areas of skin. Stop once resolved and restart as needed for flares. Avoid use on face, armpits, groin unless otherwise indicated.               Durable Medical Equipment  (From admission, onward)           Start     Ordered   05/19/24 0000  DME Bedside commode       Question:  Patient needs a bedside commode to treat with the following condition  Answer:  Pulmonary edema   05/19/24 1040            Follow-up Information     [facility]. Go on 06/11/2024.   Specialty: Cardiology Why: Hospital Follow-Up 06/11/24 @ 2:30 PM Please bring all medications to follow-up appointment [facility], [address] Free Valet Parking at the door Contact information: [address] [place]  [address] [phone]               Discharge Exam: Jared Rice   05/17/24 9287  Weight: 104.3 kg   Constitutional: Alert, awake, calm, comfortable HEENT: Neck supple Respiratory: Clear to auscultation bilaterally Cardiovascular: Regular rate and rhythm, no murmurs / rubs / gallops.  Bilateral 1+ pitting edema with chronic skin changes on lower extremity. 2+ pedal pulses. No carotid bruits.  Abdomen: Soft, no tenderness, Bowel sounds positive.  Musculoskeletal: no clubbing / cyanosis. Good ROM, no contractures. Normal muscle tone.  Skin: no rashes, lesions, ulcers. Neurologic: CN 2-12 grossly intact. Sensation intact, No focal deficit identified Psychiatric: Alert and oriented x 3. Normal mood  Condition at discharge: fair  The results of significant diagnostics from this  hospitalization (including imaging, microbiology, ancillary and laboratory) are listed below for reference.   Imaging Studies: ECHOCARDIOGRAM COMPLETE Result Date: 05/17/2024    ECHOCARDIOGRAM REPORT   Patient Name:   Jared Rice Date of Exam: 05/17/2024 Medical Rec #:  [id number]       Height:       74.0 in Accession #:    [id number]      Weight:       230.0 lb Date of Birth:  09-Oct-1932       BSA:          2.306 m Patient Age:    89 years        BP:           162/138 mmHg Patient Gender: M               HR:           69 bpm. Exam  Location:  [facility] Procedure: 2D Echo, Cardiac Doppler and Color Doppler (Both Spectral and Color            Flow Doppler were utilized during procedure). Indications:     CHF-Acute Diastolic I50.31  History:         Patient has no prior history of Echocardiogram examinations.                  CHF.  Sonographer:     Ashley McNeely-Sloane Referring Phys:  [id number] [facility] PAUDEL Diagnosing Phys: Deatrice Cage MD IMPRESSIONS  1. Left ventricular ejection fraction, by estimation, is 55 to 60%. The left ventricle has normal function. The left ventricle has no regional wall motion abnormalities. There is severe left ventricular hypertrophy. Left ventricular diastolic parameters  are indeterminate.  2. Right ventricular systolic function is normal. The right ventricular size is normal.  3. Left atrial size was severely dilated.  4. Right atrial size was moderately dilated.  5. The mitral valve is normal in structure. Mild mitral valve regurgitation. No evidence of mitral stenosis. Moderate mitral annular calcification.  6. The aortic valve is normal in structure. Aortic valve regurgitation is not visualized. Aortic valve sclerosis/calcification is present, without any evidence of aortic stenosis.  7. The inferior vena cava is normal in size with <50% respiratory variability, suggesting right atrial pressure of 8 mmHg. FINDINGS  Left Ventricle: Left ventricular ejection fraction, by  estimation, is 55 to 60%. The left ventricle has normal function. The left ventricle has no regional wall motion abnormalities. The left ventricular internal cavity size was normal in size. There is  severe left ventricular hypertrophy. Left ventricular diastolic parameters are indeterminate. Right Ventricle: The right ventricular size is normal. No increase in right ventricular wall thickness. Right ventricular systolic function is normal. Left Atrium: Left atrial size was severely dilated. Right Atrium: Right atrial size was moderately dilated. Pericardium: There is no evidence of pericardial effusion. Mitral Valve: The mitral valve is normal in structure. Moderate mitral annular calcification. Mild mitral valve regurgitation. No evidence of mitral valve stenosis. MV peak gradient, 6.2 mmHg. The mean mitral valve gradient is 2.0 mmHg. Tricuspid Valve: The tricuspid valve is normal in structure. Tricuspid valve regurgitation is mild . No evidence of tricuspid stenosis. Aortic Valve: The aortic valve is normal in structure. Aortic valve regurgitation is not visualized. Aortic valve sclerosis/calcification is present, without any evidence of aortic stenosis. Aortic valve mean gradient measures 5.0 mmHg. Aortic valve peak  gradient measures 8.4 mmHg. Aortic valve area, by VTI measures 1.51 cm. Pulmonic Valve: The pulmonic valve was normal in structure. Pulmonic valve regurgitation is mild. No evidence of pulmonic stenosis. Aorta: The aortic root is normal in size and structure. Venous: The inferior vena cava is normal in size with less than 50% respiratory variability, suggesting right atrial pressure of 8 mmHg. IAS/Shunts: No atrial level shunt detected by color flow Doppler.  LEFT VENTRICLE PLAX 2D LVIDd:         4.60 cm      Diastology LVIDs:         2.80 cm      LV e' medial:    8.16 cm/s LV PW:         2.10 cm      LV E/e' medial:  15.7 LV IVS:        2.00 cm      LV e' lateral:   9.90 cm/s LVOT diam:     1.90  cm  LV E/e' lateral: 12.9 LV SV:         51 LV SV Index:   22 LVOT Area:     2.84 cm LV IVRT:       85 msec  LV Volumes (MOD) LV vol d, MOD A2C: 143.0 ml LV vol d, MOD A4C: 99.9 ml LV vol s, MOD A2C: 52.3 ml LV vol s, MOD A4C: 74.1 ml LV SV MOD A2C:     90.7 ml LV SV MOD A4C:     99.9 ml LV SV MOD BP:      65.8 ml RIGHT VENTRICLE             IVC RV Basal diam:  4.10 cm     IVC diam: 1.60 cm RV Mid diam:    3.50 cm RV S prime:     13.50 cm/s TAPSE (M-mode): 1.9 cm LEFT ATRIUM              Index        RIGHT ATRIUM           Index LA Vol (A2C):   127.0 ml 55.06 ml/m  RA Area:     27.30 cm LA Vol (A4C):   91.2 ml  39.54 ml/m  RA Volume:   88.70 ml  38.46 ml/m LA Biplane Vol: 108.0 ml 46.82 ml/m  AORTIC VALVE                    PULMONIC VALVE AV Area (Vmax):    1.71 cm     PV Vmax:        1.18 m/s AV Area (Vmean):   1.70 cm     PV Vmean:       79.900 cm/s AV Area (VTI):     1.51 cm     PV VTI:         0.255 m AV Vmax:           145.00 cm/s  PV Peak grad:   5.6 mmHg AV Vmean:          99.800 cm/s  PV Mean grad:   3.0 mmHg AV VTI:            0.339 m      RVOT Peak grad: 4 mmHg AV Peak Grad:      8.4 mmHg AV Mean Grad:      5.0 mmHg LVOT Vmax:         87.60 cm/s LVOT Vmean:        59.700 cm/s LVOT VTI:          0.180 m LVOT/AV VTI ratio: 0.53  AORTA Ao Root diam: 3.30 cm Ao Asc diam:  3.60 cm MITRAL VALVE MV Area (PHT): 3.31 cm     SHUNTS MV Area VTI:   1.59 cm     Systemic VTI:  0.18 m MV Peak grad:  6.2 mmHg     Systemic Diam: 1.90 cm MV Mean grad:  2.0 mmHg     Pulmonic VTI:  0.189 m MV Vmax:       1.24 m/s MV Vmean:      56.9 cm/s MV Decel Time: 229 msec MV E velocity: 128.00 cm/s Deatrice Cage MD Electronically signed by Deatrice Cage MD Signature Date/Time: 05/17/2024/3:23:45 PM    Final    DG Chest 2 View Result Date: 05/17/2024 EXAM: 2 VIEW(S) XRAY OF THE CHEST 05/17/2024 07:29:07 AM COMPARISON: 02/23/2023 CLINICAL HISTORY: SOB FINDINGS: LUNGS AND PLEURA: Small  bilateral pleural effusions.  Increased interstitial markings. No focal pulmonary opacity. No pneumothorax. HEART AND MEDIASTINUM: Cardiomegaly. Atherosclerotic calcifications of aorta. BONES AND SOFT TISSUES: Multilevel degenerative changes of thoracic spine. No acute osseous abnormality. IMPRESSION: 1. Small bilateral pleural effusions and increased interstitial markings. 2. Cardiomegaly and atherosclerotic calcifications of aorta. Electronically signed by: Evalene Coho MD 05/17/2024 07:40 AM EST RP Workstation: [id number]    Microbiology: Results for orders placed or performed during the hospital encounter of 05/17/24  Resp panel by RT-PCR (RSV, Flu A&B, Covid) Anterior Nasal Swab     Status: None   Collection Time: 05/17/24  7:52 AM   Specimen: Anterior Nasal Swab  Result Value Ref Range Status   SARS Coronavirus 2 by RT PCR NEGATIVE NEGATIVE Final    Comment: (NOTE) SARS-CoV-2 target nucleic acids are NOT DETECTED.  The SARS-CoV-2 RNA is generally detectable in upper respiratory specimens during the acute phase of infection. The lowest concentration of SARS-CoV-2 viral copies this assay can detect is 138 copies/mL. A negative result does not preclude SARS-Cov-2 infection and should not be used as the sole basis for treatment or other patient management decisions. A negative result may occur with  improper specimen collection/handling, submission of specimen other than nasopharyngeal swab, presence of viral mutation(s) within the areas targeted by this assay, and inadequate number of viral copies(<138 copies/mL). A negative result must be combined with clinical observations, patient history, and epidemiological information. The expected result is Negative.  Fact Sheet for Patients:  bloggercourse.com  Fact Sheet for Healthcare Providers:  seriousbroker.it  This test is no t yet approved or cleared by the [place]  FDA and  has been authorized for  detection and/or diagnosis of SARS-CoV-2 by FDA under an Emergency Use Authorization (EUA). This EUA will remain  in effect (meaning this test can be used) for the duration of the COVID-19 declaration under Section 564(b)(1) of the Act, 21 U.S.C.section 360bbb-3(b)(1), unless the authorization is terminated  or revoked sooner.       Influenza A by PCR NEGATIVE NEGATIVE Final   Influenza B by PCR NEGATIVE NEGATIVE Final    Comment: (NOTE) The Xpert Xpress SARS-CoV-2/FLU/RSV plus assay is intended as an aid in the diagnosis of influenza from Nasopharyngeal swab specimens and should not be used as a sole basis for treatment. Nasal washings and aspirates are unacceptable for Xpert Xpress SARS-CoV-2/FLU/RSV testing.  Fact Sheet for Patients: bloggercourse.com  Fact Sheet for Healthcare Providers: seriousbroker.it  This test is not yet approved or cleared by the [place]  FDA and has been authorized for detection and/or diagnosis of SARS-CoV-2 by FDA under an Emergency Use Authorization (EUA). This EUA will remain in effect (meaning this test can be used) for the duration of the COVID-19 declaration under Section 564(b)(1) of the Act, 21 U.S.C. section 360bbb-3(b)(1), unless the authorization is terminated or revoked.     Resp Syncytial Virus by PCR NEGATIVE NEGATIVE Final    Comment: (NOTE) Fact Sheet for Patients: bloggercourse.com  Fact Sheet for Healthcare Providers: seriousbroker.it  This test is not yet approved or cleared by the [place]  FDA and has been authorized for detection and/or diagnosis of SARS-CoV-2 by FDA under an Emergency Use Authorization (EUA). This EUA will remain in effect (meaning this test can be used) for the duration of the COVID-19 declaration under Section 564(b)(1) of the Act, 21 U.S.C. section 360bbb-3(b)(1), unless the authorization is  terminated or revoked.  Performed at [facility], [address] [place], [place] [address]  Labs: CBC: Recent Labs  Lab 05/17/24 0714 05/18/24 0502 05/19/24 0219  WBC 6.1 4.5 6.6  HGB 10.3* 9.0* 9.8*  HCT 33.4* 29.6* 31.4*  MCV 99.1 97.7 95.4  PLT 145* 121* 141*   Basic Metabolic Panel: Recent Labs  Lab 05/17/24 0714 05/17/24 1030 05/18/24 0502 05/19/24 0219  NA 142  --  142 141  K 4.6  --  3.9 4.0  CL 105  --  101 97*  CO2 30  --  32 34*  GLUCOSE 106*  --  84 84  BUN 14  --  18 28*  CREATININE 0.93 0.99 1.12 1.45*  CALCIUM  8.6*  --  8.3* 8.7*  MG  --   --   --  2.2   Liver Function Tests: Recent Labs  Lab 05/18/24 0502  AST <10*  ALT <5  ALKPHOS 63  BILITOT 0.6  PROT 5.5*  ALBUMIN 3.4*   CBG: No results for input(s): GLUCAP in the last 168 hours.  Discharge time spent: greater than 30 minutes.  Signed: Laree Lock, MD [facility] 05/19/2024 "

## 2024-05-19 NOTE — Progress Notes (Signed)
 Patient able to stand and transfer to [facility]. Verbalizes that he wants to go home. Educated on CHF and diet. Family at bedside asking about discharge. Cardiology PA has seen patient and states clear to go home from his perspective. Notified Dr Jerelene re same.

## 2024-05-21 ENCOUNTER — Telehealth: Payer: Self-pay

## 2024-05-21 NOTE — Transitions of Care (Post Inpatient/ED Visit) (Signed)
" ° °  05/21/2024  Name: Jared Rice MRN: [id number] DOB: 1932/11/04  Today's TOC FU Call Status: Today's TOC FU Call Status:: Unsuccessful Call (1st Attempt) Unsuccessful Call (1st Attempt) Date: 05/21/24  Attempted to reach the patient regarding the most recent Inpatient/ED visit.  Patient request a return call back out and about with wife getting breakfast.  Follow Up Plan: Additional outreach attempts will be made to reach the patient to complete the Transitions of Care (Post Inpatient/ED visit) call.   Richerd Fish, RN, BSN, CCM [facility], [facility] Management Coordinator Direct Dial: [phone]        "

## 2024-05-24 ENCOUNTER — Telehealth: Payer: Self-pay

## 2024-05-24 NOTE — Transitions of Care (Post Inpatient/ED Visit) (Signed)
" ° °  05/24/2024  Name: Jared Rice MRN: [id number] DOB: Mar 09, 1933  Today's TOC FU Call Status: Today's TOC FU Call Status:: Unsuccessful Call (2nd Attempt) Unsuccessful Call (2nd Attempt) Date: 05/24/24  Attempted to reach the patient regarding the most recent Inpatient/ED visit. Unable to leave a voicemail message to phone number provided for preferred number listed.  Follow Up Plan: Additional outreach attempts will be made to reach the patient to complete the Transitions of Care (Post Inpatient/ED visit) call.   Richerd Fish, RN, BSN, CCM [facility], [facility] Management Coordinator Direct Dial: [phone]        "

## 2024-05-25 ENCOUNTER — Encounter: Payer: Self-pay | Admitting: Family Medicine

## 2024-05-25 ENCOUNTER — Telehealth: Payer: Self-pay | Admitting: *Deleted

## 2024-05-25 ENCOUNTER — Ambulatory Visit: Admitting: Family Medicine

## 2024-05-25 VITALS — BP 101/66 | HR 81 | Resp 16 | Ht 74.0 in | Wt 231.4 lb

## 2024-05-25 DIAGNOSIS — I5033 Acute on chronic diastolic (congestive) heart failure: Secondary | ICD-10-CM

## 2024-05-25 DIAGNOSIS — I10 Essential (primary) hypertension: Secondary | ICD-10-CM | POA: Diagnosis not present

## 2024-05-25 DIAGNOSIS — D649 Anemia, unspecified: Secondary | ICD-10-CM

## 2024-05-25 DIAGNOSIS — N179 Acute kidney failure, unspecified: Secondary | ICD-10-CM

## 2024-05-25 DIAGNOSIS — J9601 Acute respiratory failure with hypoxia: Secondary | ICD-10-CM

## 2024-05-25 NOTE — Progress Notes (Signed)
 "     Established patient visit   Patient: Jared Rice   DOB: 26-May-1933   89 y.o. Male  MRN: [id number] Visit Date: 05/25/2024  Today's healthcare provider: Jon Eva, MD   Chief Complaint  Patient presents with   Hospitalization Follow-up    ER- 05/17/24 Requesting daughter to be called for appt   Subjective    HPI HPI     Hospitalization Follow-up    Additional comments: ER- 05/17/24 Requesting daughter to be called for appt      Last edited by Wilfred Hargis RAMAN, CMA on 05/25/2024 10:19 AM.       Discussed the use of AI scribe software for clinical note transcription with the patient, who gave verbal consent to proceed.  History of Present Illness   Jared Rice is an 89 year old male with heart failure with preserved ejection fraction who presents for follow-up after recent hospitalization for acute exacerbation. He is accompanied by his wife [name], his good friend Roselind, and his daughter Niels, who is on the phone.  He was admitted December 22-24, 2025 for shortness of breath and was diagnosed with acute on chronic heart failure with preserved ejection fraction related to uncontrolled hypertension and untreated sleep apnea. He had been off Lasix  for about one month before admission. In the hospital he received IV diuresis, Lasix  was resumed, and spironolactone  and Jardiance  were started. Echocardiogram showed EF 50-55% with severe LV hypertrophy, severe left atrial dilation, and mild mitral regurgitation. He developed acute kidney injury with a creatinine rise, so repeat labs are planned today.  He has chronic atrial fibrillation, rate controlled and anticoagulated with Eliquis . Cardura  was discontinued in the hospital.  He feels much better now compared to hospitalization.  His current medications include furosemide , spironolactone , Jardiance , and ramipril . He is concerned about Jardiance  because he is prone to urinary tract infections.  He has not  started home health PT or OT yet but was contacted. He feels he can manage mobility and daily activities without additional assistance at this time.      TOC call completed 12/29  Medications: Show/hide medication list[1]  Review of Systems     Objective    BP 101/66   Pulse 81   Resp 16   Ht 6' 2 (1.88 m)   Wt 231 lb 6.4 oz (105 kg)   SpO2 96%   BMI 29.71 kg/m    Physical Exam Vitals reviewed.  Constitutional:      General: He is not in acute distress.    Appearance: Normal appearance. He is not diaphoretic.  HENT:     Head: Normocephalic and atraumatic.  Eyes:     General: No scleral icterus.    Conjunctiva/sclera: Conjunctivae normal.  Cardiovascular:     Rate and Rhythm: Normal rate and regular rhythm.     Heart sounds: Normal heart sounds.  Pulmonary:     Effort: Pulmonary effort is normal. No respiratory distress.     Breath sounds: Normal breath sounds. No wheezing or rhonchi.  Musculoskeletal:     Cervical back: Neck supple.     Right lower leg: Edema present.     Left lower leg: Edema present.  Lymphadenopathy:     Cervical: No cervical adenopathy.  Skin:    General: Skin is warm and dry.  Neurological:     Mental Status: He is alert and oriented to person, place, and time. Mental status is at baseline.  Psychiatric:  Mood and Affect: Mood normal.        Behavior: Behavior normal.      No results found for any visits on 05/25/24.  Assessment & Plan     Problem List Items Addressed This Visit       Cardiovascular and Mediastinum   HYPERTENSION, BENIGN   CHF (congestive heart failure) (HCC) - Primary   Relevant Orders   Basic Metabolic Panel (BMET)   CBC w/Diff/Platelet   Other Visit Diagnoses       AKI (acute kidney injury)       Relevant Orders   Basic Metabolic Panel (BMET)     Anemia, unspecified type       Relevant Orders   CBC w/Diff/Platelet           Acute on chronic heart failure with preserved ejection fraction  (HFpEF) Recent hospitalization for acute on chronic HFpEF due to uncontrolled hypertension and untreated sleep apnea. Off Lasix  for one month, leading to fluid overload. Echocardiogram showed EF 50-55% with severe LVA, severely dilated LA, mild MR, and indeterminate diastolic parameters. Currently on furosemide , spironolactone , and Jardiance . Jardiance  may increase UTI risk, but risk is lower due to normal blood sugars. Home health PT and OT recommended but not yet initiated. - Continue furosemide , spironolactone , and Jardiance . - Monitor for UTIs and yeast infections due to Jardiance . - Follow up with cardiologist on January 6th and January 16th. - Consider home health PT and OT for deconditioning. - Monitor weight daily to assess fluid status.  Essential hypertension Hypertension was uncontrolled during recent hospitalization. Spironolactone  added to help lower blood pressure. Cardura  was discontinued during hospitalization, possibly due to medication error. - Continue spironolactone  and ramipril  for blood pressure management.  Acute kidney injury AKI occurred during hospitalization with increased creatinine levels. Likely due to diuresis and fluid management. Monitoring kidney function is crucial post-hospitalization. - Ordered repeat labs to assess kidney function.  Anemia Noted during hospitalization, possibly due to fluid shifts. Monitoring blood counts is necessary to ensure stability. - Ordered repeat labs to assess blood counts.  Chronic atrial fibrillation Rate controlled. Currently on Eliquis  for anticoagulation. - Continue Eliquis  for anticoagulation.      Difficulty with climbing stairs. Rx for stairlift faxed per patient request.  Return in about 2 months (around 07/24/2024) for chronic disease f/u, With PCP.       Jon Eva, MD  [facility] [phone] (phone) [phone] (fax)  [facility]      [1]  Outpatient  Medications Prior to Visit  Medication Sig   apixaban  (ELIQUIS ) 5 MG TABS tablet Take 1 tablet (5 mg total) by mouth 2 (two) times daily.   clobetasol  cream (TEMOVATE ) 0.05 % Apply twice a day until smooth. Avoid applying to face, groin, and axilla.   cyanocobalamin  (VITAMIN B12) 1000 MCG tablet Take 1 tablet (1,000 mcg total) by mouth daily.   dupilumab  (DUPIXENT ) 300 MG/2ML prefilled syringe Inject 300 mg into the skin every 14 (fourteen) days. Starting at day 15 for maintenance.   empagliflozin  (JARDIANCE ) 10 MG TABS tablet Take 1 tablet (10 mg total) by mouth daily.   furosemide  (LASIX ) 40 MG tablet Take 1 tablet (40 mg total) by mouth daily.   levothyroxine  (SYNTHROID ) 88 MCG tablet Take 88 mcg by mouth every morning.   psyllium (METAMUCIL) 58.6 % packet Take 1 packet by mouth daily.   ramipril  (ALTACE ) 5 MG capsule Take 1 capsule (5 mg total) by mouth daily.  rosuvastatin  (CRESTOR ) 10 MG tablet Take 1 tablet by mouth once daily   spironolactone  (ALDACTONE ) 25 MG tablet Take 1 tablet (25 mg total) by mouth daily.   triamcinolone  ointment (KENALOG ) 0.1 % Apply 1 gram twice daily to affected areas of skin. Stop once resolved and restart as needed for flares. Avoid use on face, armpits, groin unless otherwise indicated.   No facility-administered medications prior to visit.   "

## 2024-05-25 NOTE — Transitions of Care (Post Inpatient/ED Visit) (Signed)
" ° °  05/25/2024  Name: Jared Rice MRN: [id number] DOB: [birth year]  Today's TOC FU Call Status: Today's TOC FU Call Status:: Successful TOC FU Call Completed TOC FU Call Complete Date: 05/25/24  Patient's Name and Date of Birth confirmed. Name, DOB  Transition Care Management Follow-up Telephone Call Date of Discharge: 05/19/24 Discharge Facility: [facility]) Type of Discharge: Inpatient Admission Primary Inpatient Discharge Diagnosis:: Acute respiratory failure with hypoxia; Acute congestive heart failure, unspecified heart failure type How have you been since you were released from the hospital?: Better Any questions or concerns?: No  Items Reviewed: Did you receive and understand the discharge instructions provided?: Yes Medications obtained,verified, and reconciled?: Yes (Medications Reviewed) Any new allergies since your discharge?: No Dietary orders reviewed?: No Do you have support at home?: Yes People in Home [RPT]: spouse Name of Support/Comfort Primary Source: [name]  Medications Reviewed Today: Medications Reviewed Today   Medications were not reviewed in this encounter     Home Care and Equipment/Supplies: Were Home Health Services Ordered?: Yes Name of Home Health Agency:: [facility] Has Agency set up a time to come to your home?: Yes First Home Health Visit Date: 05/26/24 Any new equipment or medical supplies ordered?: NA  Functional Questionnaire: Do you need assistance with bathing/showering or dressing?: No Do you need assistance with meal preparation?: Yes Do you need assistance with eating?: No Do you have difficulty maintaining continence: No Do you need assistance with getting out of bed/getting out of a chair/moving?: No Do you have difficulty managing or taking your medications?: No  Follow up appointments reviewed: PCP Follow-up appointment confirmed?: Yes Date of PCP follow-up appointment?: 05/25/24 Follow-up  Provider: Jon Eva, MD Specialist Hospital Follow-up appointment confirmed?: Yes Date of Specialist follow-up appointment?: 06/01/24 Follow-Up Specialty Provider:: Office Visit with Peter Jordan, MD Tuesday January 6 1:20 PM (Arrive by 1:00 PM)heartcare  [phone] heart failure clinic [address] Do you need transportation to your follow-up appointment?: No Do you understand care options if your condition(s) worsen?: Yes-patient verbalized understanding  SDOH Interventions Today    Flowsheet Row Most Recent Value  SDOH Interventions   Food Insecurity Interventions Intervention Not Indicated  Housing Interventions Intervention Not Indicated  Transportation Interventions Intervention Not Indicated, Patient Resources (Friends/Family)  Utilities Interventions Intervention Not Indicated   Discussed and offered 30 day TOC program.  Patient  declined.  The patient has been provided with contact information for the care management team and has been advised to call with any health -related questions or concerns.  The patient verbalized understanding with current plan of care.  The patient is directed to their insurance card regarding availability of benefits coverage  Cathlean Headland BSN RN [facility] Health Care Management Coordinator [email or web address] Direct Dial: [phone]  Fax: [phone] Website: [email or web address]  "

## 2024-05-26 LAB — CBC WITH DIFFERENTIAL/PLATELET
Basophils Absolute: 0 x10E3/uL (ref 0.0–0.2)
Basos: 1 %
EOS (ABSOLUTE): 0.3 x10E3/uL (ref 0.0–0.4)
Eos: 6 %
Hematocrit: 33.9 % — ABNORMAL LOW (ref 37.5–51.0)
Hemoglobin: 10.4 g/dL — ABNORMAL LOW (ref 13.0–17.7)
Immature Grans (Abs): 0 x10E3/uL (ref 0.0–0.1)
Immature Granulocytes: 0 %
Lymphocytes Absolute: 0.9 x10E3/uL (ref 0.7–3.1)
Lymphs: 18 %
MCH: 30.1 pg (ref 26.6–33.0)
MCHC: 30.7 g/dL — ABNORMAL LOW (ref 31.5–35.7)
MCV: 98 fL — ABNORMAL HIGH (ref 79–97)
Monocytes Absolute: 0.4 x10E3/uL (ref 0.1–0.9)
Monocytes: 8 %
Neutrophils Absolute: 3.6 x10E3/uL (ref 1.4–7.0)
Neutrophils: 67 %
Platelets: 145 x10E3/uL — ABNORMAL LOW (ref 150–450)
RBC: 3.45 x10E6/uL — ABNORMAL LOW (ref 4.14–5.80)
RDW: 13.9 % (ref 11.6–15.4)
WBC: 5.2 x10E3/uL (ref 3.4–10.8)

## 2024-05-26 LAB — BASIC METABOLIC PANEL WITH GFR
BUN/Creatinine Ratio: 13 (ref 10–24)
BUN: 18 mg/dL (ref 8–27)
CO2: 27 mmol/L (ref 20–29)
Calcium: 8.6 mg/dL (ref 8.6–10.2)
Chloride: 102 mmol/L (ref 96–106)
Creatinine, Ser: 1.34 mg/dL — ABNORMAL HIGH (ref 0.76–1.27)
Glucose: 120 mg/dL — ABNORMAL HIGH (ref 70–99)
Potassium: 4.5 mmol/L (ref 3.5–5.2)
Sodium: 142 mmol/L (ref 134–144)
eGFR: 51 mL/min/1.73 — ABNORMAL LOW
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
## 2024-06-01 ENCOUNTER — Encounter: Payer: Self-pay | Admitting: Cardiology

## 2024-06-01 ENCOUNTER — Ambulatory Visit: Admitting: Cardiology

## 2024-06-01 ENCOUNTER — Ambulatory Visit: Admitting: Family Medicine

## 2024-06-01 VITALS — BP 130/74 | HR 82 | Ht 75.0 in | Wt 228.8 lb

## 2024-06-01 DIAGNOSIS — I447 Left bundle-branch block, unspecified: Secondary | ICD-10-CM

## 2024-06-01 DIAGNOSIS — I5033 Acute on chronic diastolic (congestive) heart failure: Secondary | ICD-10-CM | POA: Diagnosis not present

## 2024-06-01 DIAGNOSIS — I10 Essential (primary) hypertension: Secondary | ICD-10-CM

## 2024-06-01 DIAGNOSIS — I4819 Other persistent atrial fibrillation: Secondary | ICD-10-CM

## 2024-06-01 NOTE — Patient Instructions (Addendum)
 Medication Instructions:  Take all medications *If you need a refill on your cardiac medications before your next appointment, please call your pharmacy*  Lab Work: None ordered   Testing/Procedures: None ordered  Follow-Up: At [facility], you and your health needs are our priority.  As part of our continuing mission to provide you with exceptional heart care, our providers are all part of one team.  This team includes your primary Cardiologist (physician) and Advanced Practice Providers or APPs (Physician Assistants and Nurse Practitioners) who all work together to provide you with the care you need, when you need it.  Your next appointment:  3 months    Call in Feb to schedule April appointment     Provider:  Dr.Jordan       Weigh daily      Low sodium diet     We recommend signing up for the patient portal called MyChart.  Sign up information is provided on this After Visit Summary.  MyChart is used to connect with patients for Virtual Visits (Telemedicine).  Patients are able to view lab/test results, encounter notes, upcoming appointments, etc.  Non-urgent messages can be sent to your provider as well.   To learn more about what you can do with MyChart, go to [email or web address].

## 2024-06-07 DIAGNOSIS — J449 Chronic obstructive pulmonary disease, unspecified: Secondary | ICD-10-CM | POA: Diagnosis not present

## 2024-06-07 DIAGNOSIS — Z9841 Cataract extraction status, right eye: Secondary | ICD-10-CM | POA: Diagnosis not present

## 2024-06-07 DIAGNOSIS — I110 Hypertensive heart disease with heart failure: Secondary | ICD-10-CM | POA: Diagnosis not present

## 2024-06-07 DIAGNOSIS — Z860100 Personal history of colon polyps, unspecified: Secondary | ICD-10-CM | POA: Diagnosis not present

## 2024-06-07 DIAGNOSIS — E039 Hypothyroidism, unspecified: Secondary | ICD-10-CM | POA: Diagnosis not present

## 2024-06-07 DIAGNOSIS — D509 Iron deficiency anemia, unspecified: Secondary | ICD-10-CM | POA: Diagnosis not present

## 2024-06-07 DIAGNOSIS — Z9842 Cataract extraction status, left eye: Secondary | ICD-10-CM | POA: Diagnosis not present

## 2024-06-07 DIAGNOSIS — I5033 Acute on chronic diastolic (congestive) heart failure: Secondary | ICD-10-CM | POA: Diagnosis not present

## 2024-06-07 DIAGNOSIS — I4891 Unspecified atrial fibrillation: Secondary | ICD-10-CM | POA: Diagnosis not present

## 2024-06-07 DIAGNOSIS — Z9181 History of falling: Secondary | ICD-10-CM | POA: Diagnosis not present

## 2024-06-07 DIAGNOSIS — G4730 Sleep apnea, unspecified: Secondary | ICD-10-CM | POA: Diagnosis not present

## 2024-06-07 DIAGNOSIS — Z85828 Personal history of other malignant neoplasm of skin: Secondary | ICD-10-CM | POA: Diagnosis not present

## 2024-06-11 ENCOUNTER — Ambulatory Visit: Admitting: Family

## 2024-06-29 ENCOUNTER — Telehealth: Payer: Self-pay

## 2024-06-29 ENCOUNTER — Telehealth: Payer: Self-pay | Admitting: Cardiology

## 2024-06-29 ENCOUNTER — Encounter: Payer: Self-pay | Admitting: Family Medicine

## 2024-06-29 ENCOUNTER — Other Ambulatory Visit: Payer: Self-pay | Admitting: Cardiology

## 2024-06-29 ENCOUNTER — Other Ambulatory Visit: Payer: Self-pay | Admitting: Family Medicine

## 2024-06-29 DIAGNOSIS — I10 Essential (primary) hypertension: Secondary | ICD-10-CM

## 2024-06-29 DIAGNOSIS — E038 Other specified hypothyroidism: Secondary | ICD-10-CM

## 2024-06-29 DIAGNOSIS — E785 Hyperlipidemia, unspecified: Secondary | ICD-10-CM

## 2024-06-29 NOTE — Telephone Encounter (Signed)
" °*  STAT* If patient is at the pharmacy, call can be transferred to refill team.   1. Which medications need to be refilled? (please list name of each medication and dose if known) furosemide  (LASIX ) 40 MG tablet  empagliflozin  (JARDIANCE ) 10 MG TABS tablet  spironolactone  (ALDACTONE ) 25 MG tablet  apixaban  (ELIQUIS ) 5 MG TABS tablet   2. Would you like to learn more about the convenience, safety, & potential cost savings by using the [facility]?    3. Are you open to using the [facility] (Type [facility]. ).   4. Which pharmacy/location (including street and city if local pharmacy) is medication to be sent to? [facility] - [address] [name] - [address]    5. Do they need a 30 day or 90 day supply? 90 day  "

## 2024-06-29 NOTE — Telephone Encounter (Signed)
 Last Visit: 01/28/2024 Next Visit: 08/10/2024 Last Refill: Not on current med list, please advise

## 2024-06-29 NOTE — Telephone Encounter (Signed)
 Copied from CRM #[id number]. Topic: Clinical - Home Health Verbal Orders >> Jun 29, 2024  3:23 PM China J wrote: Caller/Agency: [facility] Callback Number: [phone]  Secured voicemail  Service Requested: Occupational Therapy Frequency: 1x week 5. Any new concerns about the patient? No

## 2024-06-29 NOTE — Telephone Encounter (Signed)
 Ok for verbal orders.    Rockie Agent, MD, DABOM [facility]

## 2024-06-29 NOTE — Telephone Encounter (Signed)
 Ok for verbal orders ?

## 2024-06-30 MED ORDER — APIXABAN 5 MG PO TABS: 5.0000 mg | ORAL_TABLET | Freq: Two times a day (BID) | ORAL | 1 refills | Status: AC

## 2024-06-30 MED ORDER — FUROSEMIDE 40 MG PO TABS: 40.0000 mg | ORAL_TABLET | Freq: Every day | ORAL | 1 refills | Status: AC

## 2024-06-30 MED ORDER — EMPAGLIFLOZIN 10 MG PO TABS: 10.0000 mg | ORAL_TABLET | Freq: Every day | ORAL | 1 refills | Status: AC

## 2024-06-30 MED ORDER — SPIRONOLACTONE 25 MG PO TABS: 25.0000 mg | ORAL_TABLET | Freq: Every day | ORAL | 1 refills | Status: AC

## 2024-06-30 NOTE — Telephone Encounter (Signed)
 Refills sent

## 2024-06-30 NOTE — Telephone Encounter (Signed)
 Prescription refill request for Eliquis  received. Indication: AFIB Last office visit: 06/01/24 Scr: 1.34 Age: 89 Weight: 103.8KG

## 2024-07-06 ENCOUNTER — Ambulatory Visit: Admitting: Urology

## 2024-07-14 ENCOUNTER — Ambulatory Visit: Admitting: Dermatology

## 2024-08-10 ENCOUNTER — Ambulatory Visit: Admitting: Family Medicine

## 2024-08-11 ENCOUNTER — Ambulatory Visit: Admitting: Family Medicine

## 2024-08-11 ENCOUNTER — Ambulatory Visit
